# Patient Record
Sex: Female | Born: 1960
Health system: Southern US, Community
[De-identification: ages and names within clinical notes are randomized; demographics above are authoritative.]

## PROBLEM LIST (undated history)

## (undated) DIAGNOSIS — C3490 Malignant neoplasm of unspecified part of unspecified bronchus or lung: Secondary | ICD-10-CM

## (undated) DIAGNOSIS — Z5111 Encounter for antineoplastic chemotherapy: Secondary | ICD-10-CM

## (undated) DIAGNOSIS — L270 Generalized skin eruption due to drugs and medicaments taken internally: Secondary | ICD-10-CM

## (undated) DIAGNOSIS — Z923 Personal history of irradiation: Secondary | ICD-10-CM

## (undated) HISTORY — DX: Malignant neoplasm of unspecified part of unspecified bronchus or lung: C34.90

## (undated) HISTORY — DX: Encounter for antineoplastic chemotherapy: Z51.11

## (undated) HISTORY — DX: Generalized skin eruption due to drugs and medicaments taken internally: L27.0

---

## 1998-07-06 ENCOUNTER — Emergency Department (HOSPITAL_COMMUNITY): Admission: EM | Admit: 1998-07-06 | Discharge: 1998-07-06 | Payer: Self-pay | Admitting: Emergency Medicine

## 2008-10-19 ENCOUNTER — Ambulatory Visit (HOSPITAL_COMMUNITY): Admission: RE | Admit: 2008-10-19 | Discharge: 2008-10-19 | Payer: Self-pay | Admitting: Emergency Medicine

## 2008-10-29 ENCOUNTER — Ambulatory Visit: Payer: Self-pay | Admitting: Pulmonary Disease

## 2008-11-18 ENCOUNTER — Ambulatory Visit: Payer: Self-pay | Admitting: Pulmonary Disease

## 2008-11-19 ENCOUNTER — Telehealth: Payer: Self-pay | Admitting: Pulmonary Disease

## 2008-12-10 ENCOUNTER — Ambulatory Visit: Payer: Self-pay | Admitting: Pulmonary Disease

## 2008-12-12 ENCOUNTER — Ambulatory Visit: Payer: Self-pay | Admitting: Pulmonary Disease

## 2008-12-12 ENCOUNTER — Ambulatory Visit: Admission: RE | Admit: 2008-12-12 | Discharge: 2008-12-12 | Payer: Self-pay | Admitting: Pulmonary Disease

## 2008-12-12 ENCOUNTER — Encounter: Payer: Self-pay | Admitting: Pulmonary Disease

## 2009-01-01 ENCOUNTER — Ambulatory Visit: Payer: Self-pay | Admitting: Pulmonary Disease

## 2009-02-16 ENCOUNTER — Ambulatory Visit: Payer: Self-pay | Admitting: Pulmonary Disease

## 2009-02-20 ENCOUNTER — Ambulatory Visit (HOSPITAL_COMMUNITY): Admission: RE | Admit: 2009-02-20 | Discharge: 2009-02-20 | Payer: Self-pay | Admitting: Pulmonary Disease

## 2009-03-06 ENCOUNTER — Ambulatory Visit (HOSPITAL_COMMUNITY): Admission: RE | Admit: 2009-03-06 | Discharge: 2009-03-06 | Payer: Self-pay | Admitting: Pulmonary Disease

## 2009-03-09 ENCOUNTER — Ambulatory Visit: Payer: Self-pay | Admitting: Pulmonary Disease

## 2009-03-11 ENCOUNTER — Encounter: Payer: Self-pay | Admitting: Pulmonary Disease

## 2009-03-13 ENCOUNTER — Ambulatory Visit: Admission: RE | Admit: 2009-03-13 | Discharge: 2009-03-13 | Payer: Self-pay | Admitting: Psychiatry

## 2009-03-13 ENCOUNTER — Encounter: Payer: Self-pay | Admitting: Pulmonary Disease

## 2009-03-17 ENCOUNTER — Encounter: Payer: Self-pay | Admitting: Pulmonary Disease

## 2009-03-17 ENCOUNTER — Ambulatory Visit (HOSPITAL_COMMUNITY): Admission: RE | Admit: 2009-03-17 | Discharge: 2009-03-17 | Payer: Self-pay | Admitting: Gynecologic Oncology

## 2009-03-17 ENCOUNTER — Ambulatory Visit: Admission: RE | Admit: 2009-03-17 | Discharge: 2009-03-17 | Payer: Self-pay | Admitting: Gynecologic Oncology

## 2009-03-20 ENCOUNTER — Ambulatory Visit: Payer: Self-pay | Admitting: Surgery

## 2009-04-08 ENCOUNTER — Encounter: Payer: Self-pay | Admitting: Surgery

## 2009-04-08 ENCOUNTER — Encounter: Payer: Self-pay | Admitting: Pulmonary Disease

## 2009-04-08 ENCOUNTER — Ambulatory Visit: Payer: Self-pay | Admitting: Surgery

## 2009-04-08 ENCOUNTER — Inpatient Hospital Stay (HOSPITAL_COMMUNITY): Admission: RE | Admit: 2009-04-08 | Discharge: 2009-04-13 | Payer: Self-pay | Admitting: Surgery

## 2009-04-16 ENCOUNTER — Ambulatory Visit: Payer: Self-pay | Admitting: Surgery

## 2009-04-18 HISTORY — PX: LUNG LOBECTOMY: SHX167

## 2009-04-28 ENCOUNTER — Ambulatory Visit: Payer: Self-pay | Admitting: Internal Medicine

## 2009-04-28 ENCOUNTER — Ambulatory Visit: Payer: Self-pay | Admitting: Surgery

## 2009-04-28 ENCOUNTER — Encounter: Admission: RE | Admit: 2009-04-28 | Discharge: 2009-04-28 | Payer: Self-pay | Admitting: Surgery

## 2009-05-04 ENCOUNTER — Encounter: Payer: Self-pay | Admitting: Pulmonary Disease

## 2009-05-04 LAB — CBC WITH DIFFERENTIAL/PLATELET
BASO%: 0.2 % (ref 0.0–2.0)
Basophils Absolute: 0 10*3/uL (ref 0.0–0.1)
EOS%: 4.4 % (ref 0.0–7.0)
Eosinophils Absolute: 0.4 10*3/uL (ref 0.0–0.5)
HCT: 36.7 % (ref 34.8–46.6)
HGB: 11.7 g/dL (ref 11.6–15.9)
LYMPH%: 21.3 % (ref 14.0–49.7)
MCH: 24.2 pg — ABNORMAL LOW (ref 25.1–34.0)
MCHC: 31.9 g/dL (ref 31.5–36.0)
MCV: 76 fL — ABNORMAL LOW (ref 79.5–101.0)
MONO#: 0.6 10*3/uL (ref 0.1–0.9)
MONO%: 6.3 % (ref 0.0–14.0)
NEUT#: 6.6 10*3/uL — ABNORMAL HIGH (ref 1.5–6.5)
NEUT%: 67.8 % (ref 38.4–76.8)
Platelets: 379 10*3/uL (ref 145–400)
RBC: 4.83 10*6/uL (ref 3.70–5.45)
RDW: 15 % — ABNORMAL HIGH (ref 11.2–14.5)
WBC: 9.7 10*3/uL (ref 3.9–10.3)
lymph#: 2.1 10*3/uL (ref 0.9–3.3)

## 2009-05-04 LAB — COMPREHENSIVE METABOLIC PANEL
ALT: 11 U/L (ref 0–35)
AST: 16 U/L (ref 0–37)
Albumin: 4.2 g/dL (ref 3.5–5.2)
Alkaline Phosphatase: 54 U/L (ref 39–117)
BUN: 9 mg/dL (ref 6–23)
CO2: 24 mEq/L (ref 19–32)
Calcium: 9.2 mg/dL (ref 8.4–10.5)
Chloride: 104 mEq/L (ref 96–112)
Creatinine, Ser: 0.47 mg/dL (ref 0.40–1.20)
Glucose, Bld: 82 mg/dL (ref 70–99)
Potassium: 4.6 mEq/L (ref 3.5–5.3)
Sodium: 138 mEq/L (ref 135–145)
Total Bilirubin: 0.2 mg/dL — ABNORMAL LOW (ref 0.3–1.2)
Total Protein: 7.2 g/dL (ref 6.0–8.3)

## 2009-05-05 ENCOUNTER — Ambulatory Visit: Payer: Self-pay | Admitting: Pulmonary Disease

## 2009-05-06 DIAGNOSIS — C3431 Malignant neoplasm of lower lobe, right bronchus or lung: Secondary | ICD-10-CM | POA: Insufficient documentation

## 2009-05-06 DIAGNOSIS — D259 Leiomyoma of uterus, unspecified: Secondary | ICD-10-CM | POA: Insufficient documentation

## 2009-06-16 ENCOUNTER — Encounter: Admission: RE | Admit: 2009-06-16 | Discharge: 2009-06-16 | Payer: Self-pay | Admitting: Family Medicine

## 2009-08-04 ENCOUNTER — Encounter: Admission: RE | Admit: 2009-08-04 | Discharge: 2009-08-04 | Payer: Self-pay | Admitting: Surgery

## 2009-08-04 ENCOUNTER — Ambulatory Visit: Payer: Self-pay | Admitting: Surgery

## 2009-11-03 ENCOUNTER — Ambulatory Visit: Payer: Self-pay | Admitting: Surgery

## 2009-11-03 ENCOUNTER — Encounter: Admission: RE | Admit: 2009-11-03 | Discharge: 2009-11-03 | Payer: Self-pay | Admitting: Surgery

## 2009-11-11 ENCOUNTER — Ambulatory Visit: Payer: Self-pay | Admitting: Internal Medicine

## 2009-11-13 ENCOUNTER — Ambulatory Visit (HOSPITAL_COMMUNITY): Admission: RE | Admit: 2009-11-13 | Discharge: 2009-11-13 | Payer: Self-pay | Admitting: Internal Medicine

## 2009-11-13 LAB — COMPREHENSIVE METABOLIC PANEL
ALT: 17 U/L (ref 0–35)
AST: 20 U/L (ref 0–37)
Albumin: 3.9 g/dL (ref 3.5–5.2)
Alkaline Phosphatase: 62 U/L (ref 39–117)
BUN: 11 mg/dL (ref 6–23)
CO2: 27 mEq/L (ref 19–32)
Calcium: 8.6 mg/dL (ref 8.4–10.5)
Chloride: 105 mEq/L (ref 96–112)
Creatinine, Ser: 0.61 mg/dL (ref 0.40–1.20)
Glucose, Bld: 86 mg/dL (ref 70–99)
Potassium: 4.3 mEq/L (ref 3.5–5.3)
Sodium: 137 mEq/L (ref 135–145)
Total Bilirubin: 0.4 mg/dL (ref 0.3–1.2)
Total Protein: 7.7 g/dL (ref 6.0–8.3)

## 2009-11-13 LAB — CBC WITH DIFFERENTIAL/PLATELET
BASO%: 0.5 % (ref 0.0–2.0)
Basophils Absolute: 0.1 10*3/uL (ref 0.0–0.1)
EOS%: 0.7 % (ref 0.0–7.0)
Eosinophils Absolute: 0.1 10*3/uL (ref 0.0–0.5)
HCT: 35.3 % (ref 34.8–46.6)
HGB: 11.4 g/dL — ABNORMAL LOW (ref 11.6–15.9)
LYMPH%: 21.3 % (ref 14.0–49.7)
MCH: 23.8 pg — ABNORMAL LOW (ref 25.1–34.0)
MCHC: 32.3 g/dL (ref 31.5–36.0)
MCV: 73.6 fL — ABNORMAL LOW (ref 79.5–101.0)
MONO#: 0.8 10*3/uL (ref 0.1–0.9)
MONO%: 7.6 % (ref 0.0–14.0)
NEUT#: 7.6 10*3/uL — ABNORMAL HIGH (ref 1.5–6.5)
NEUT%: 69.9 % (ref 38.4–76.8)
Platelets: 317 10*3/uL (ref 145–400)
RBC: 4.79 10*6/uL (ref 3.70–5.45)
RDW: 17.6 % — ABNORMAL HIGH (ref 11.2–14.5)
WBC: 10.8 10*3/uL — ABNORMAL HIGH (ref 3.9–10.3)
lymph#: 2.3 10*3/uL (ref 0.9–3.3)

## 2009-11-17 ENCOUNTER — Encounter: Payer: Self-pay | Admitting: Pulmonary Disease

## 2010-02-02 ENCOUNTER — Encounter: Admission: RE | Admit: 2010-02-02 | Discharge: 2010-02-02 | Payer: Self-pay | Admitting: Surgery

## 2010-02-02 ENCOUNTER — Ambulatory Visit: Payer: Self-pay | Admitting: Surgery

## 2010-05-07 ENCOUNTER — Ambulatory Visit: Payer: Self-pay | Admitting: Internal Medicine

## 2010-05-11 ENCOUNTER — Ambulatory Visit (HOSPITAL_COMMUNITY): Admission: RE | Admit: 2010-05-11 | Discharge: 2010-05-11 | Payer: Self-pay | Admitting: Internal Medicine

## 2010-05-11 LAB — CBC WITH DIFFERENTIAL/PLATELET
BASO%: 0.6 % (ref 0.0–2.0)
Basophils Absolute: 0.1 10*3/uL (ref 0.0–0.1)
EOS%: 0.7 % (ref 0.0–7.0)
Eosinophils Absolute: 0.1 10*3/uL (ref 0.0–0.5)
HCT: 34.4 % — ABNORMAL LOW (ref 34.8–46.6)
HGB: 11.1 g/dL — ABNORMAL LOW (ref 11.6–15.9)
LYMPH%: 31.2 % (ref 14.0–49.7)
MCH: 23 pg — ABNORMAL LOW (ref 25.1–34.0)
MCHC: 32.2 g/dL (ref 31.5–36.0)
MCV: 71.2 fL — ABNORMAL LOW (ref 79.5–101.0)
MONO#: 0.6 10*3/uL (ref 0.1–0.9)
MONO%: 7.2 % (ref 0.0–14.0)
NEUT#: 5.4 10*3/uL (ref 1.5–6.5)
NEUT%: 60.3 % (ref 38.4–76.8)
Platelets: 444 10*3/uL — ABNORMAL HIGH (ref 145–400)
RBC: 4.83 10*6/uL (ref 3.70–5.45)
RDW: 17.1 % — ABNORMAL HIGH (ref 11.2–14.5)
WBC: 9 10*3/uL (ref 3.9–10.3)
lymph#: 2.8 10*3/uL (ref 0.9–3.3)

## 2010-05-11 LAB — COMPREHENSIVE METABOLIC PANEL
ALT: 14 U/L (ref 0–35)
AST: 18 U/L (ref 0–37)
Albumin: 4 g/dL (ref 3.5–5.2)
Alkaline Phosphatase: 57 U/L (ref 39–117)
BUN: 6 mg/dL (ref 6–23)
CO2: 29 mEq/L (ref 19–32)
Calcium: 8.9 mg/dL (ref 8.4–10.5)
Chloride: 104 mEq/L (ref 96–112)
Creatinine, Ser: 0.49 mg/dL (ref 0.40–1.20)
Glucose, Bld: 80 mg/dL (ref 70–99)
Potassium: 4.1 mEq/L (ref 3.5–5.3)
Sodium: 139 mEq/L (ref 135–145)
Total Bilirubin: 0.5 mg/dL (ref 0.3–1.2)
Total Protein: 7.5 g/dL (ref 6.0–8.3)

## 2010-05-17 IMAGING — CT CT CHEST W/O CM
2 of 4 series · 15 of 36 positions shown, 18 images · non-contrast
Comparison: 10/19/2008 chest CT and [DATE] Move to 5252 chest x-ray.

CLINICAL DATA: Follow-up right lower lobe lung process.

CT CHEST WITHOUT CONTRAST
TECHNIQUE: Multidetector CT imaging of the chest was performed
following the standard protocol without IV contrast.

[Series 2: chest w/o st · axial · non-contrast · 0.53mm/px · z∈[-196,-11]mm · 12 of 45 slices shown, 15 images]
[im 4/45  mediastinal]
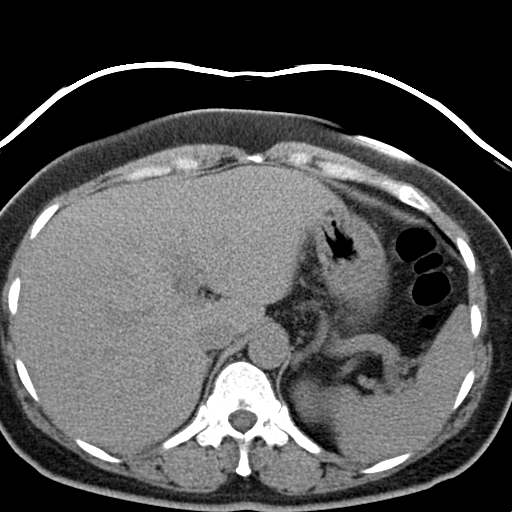
[im 4/45  lung]
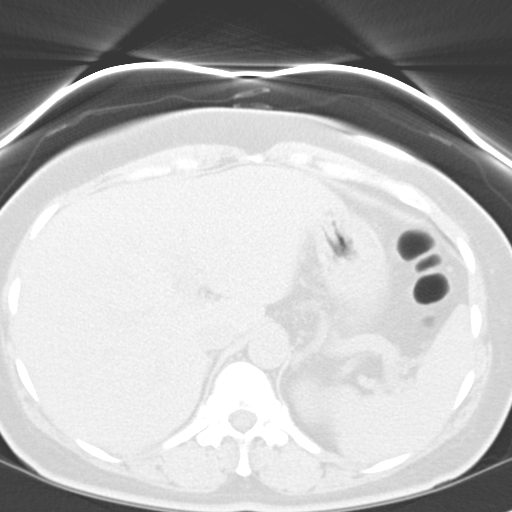
[im 7/45  lung]
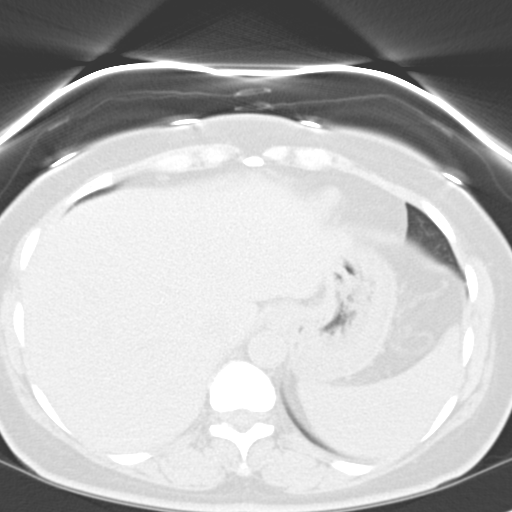
[im 11/45  lung]
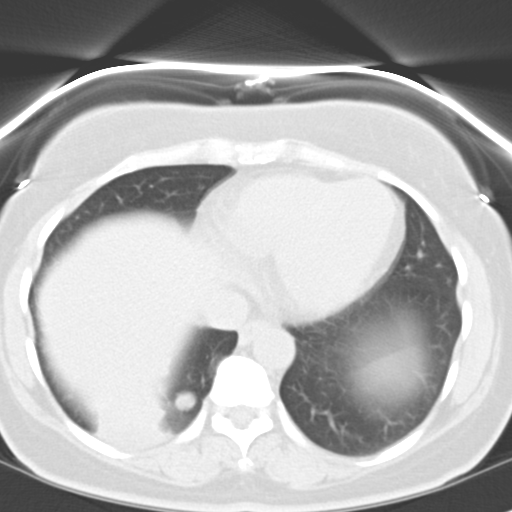
[im 14/45  lung]
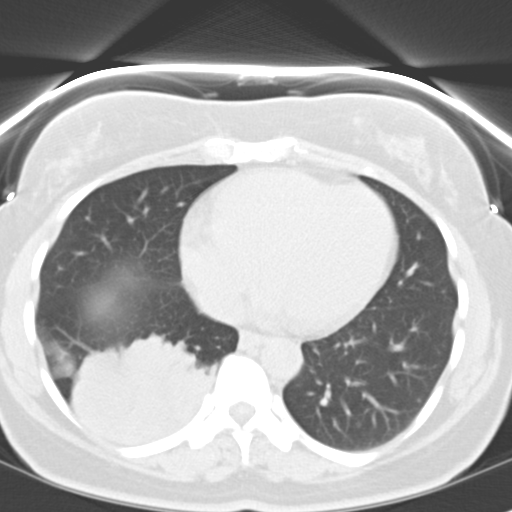
[im 17/45  mediastinal]
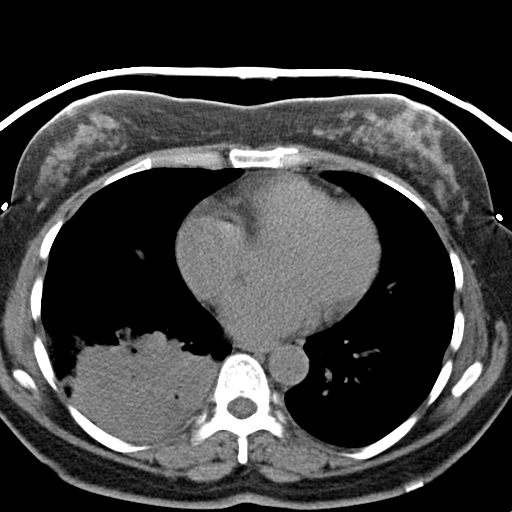
[im 17/45  lung]
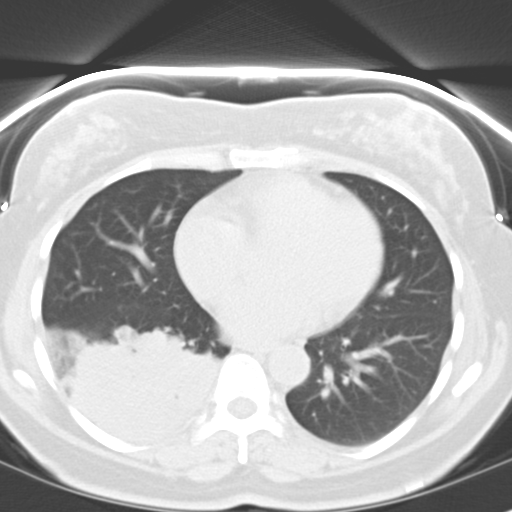
[im 21/45  lung]
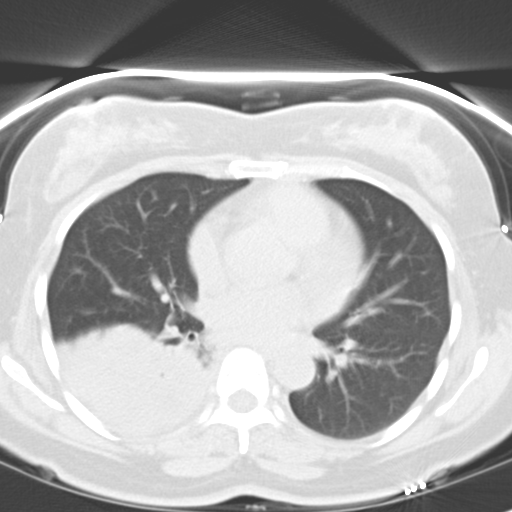
[im 24/45  lung]
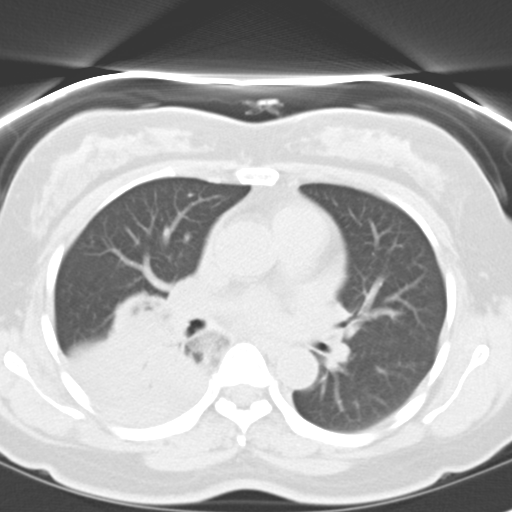
[im 28/45  lung]
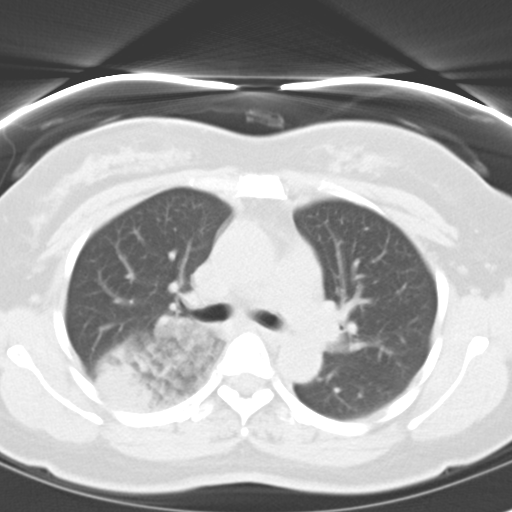
[im 31/45  mediastinal]
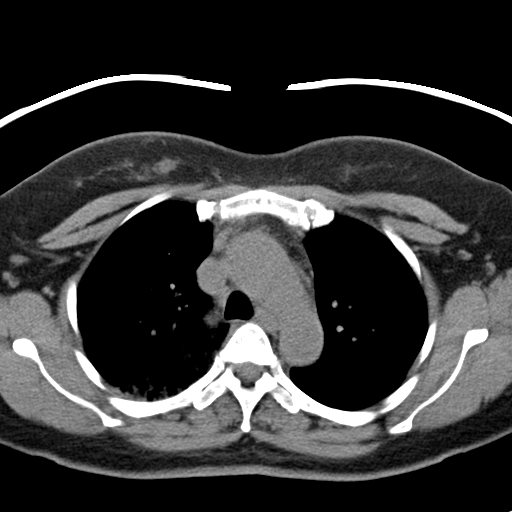
[im 31/45  lung]
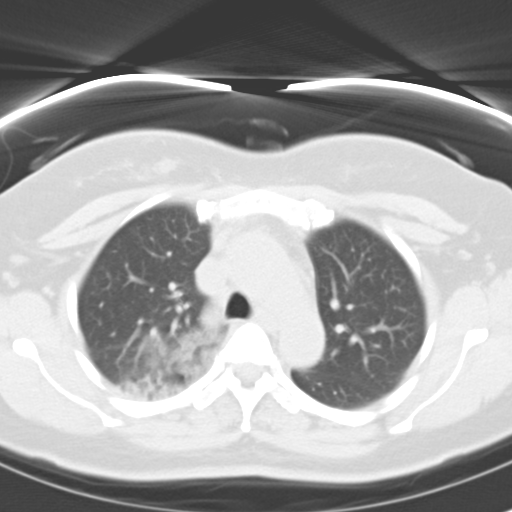
[im 34/45  lung]
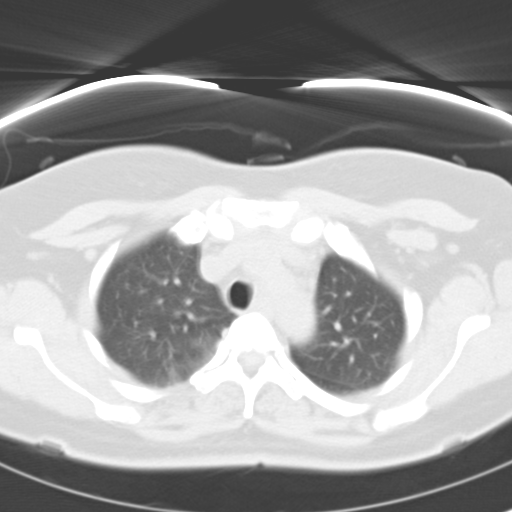
[im 38/45  lung]
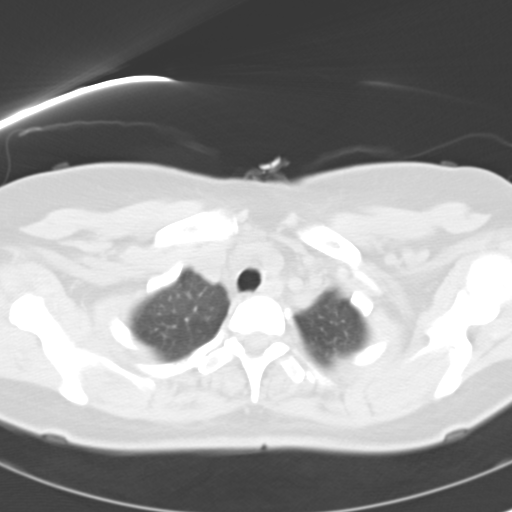
[im 41/45  lung]
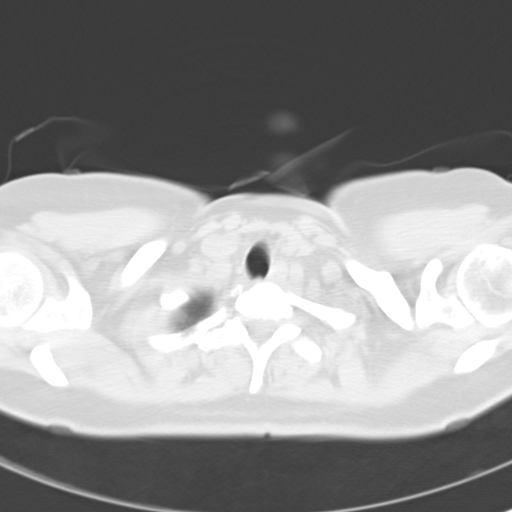

[Series 602: <mpr thick range> · coronal · 0.53mm/px · 3 of 63 slices shown]
[im 13/63  lung]
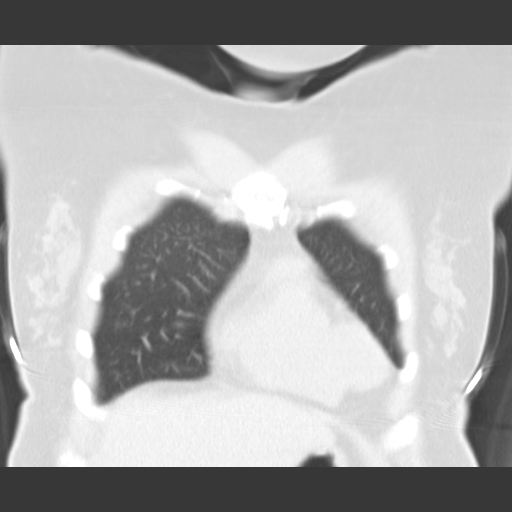
[im 25/63  lung]
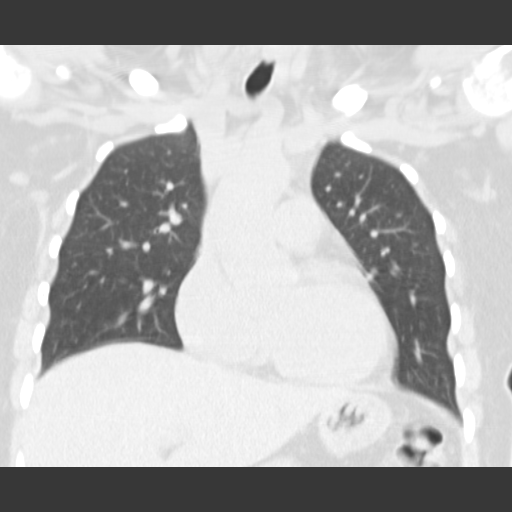
[im 38/63  lung]
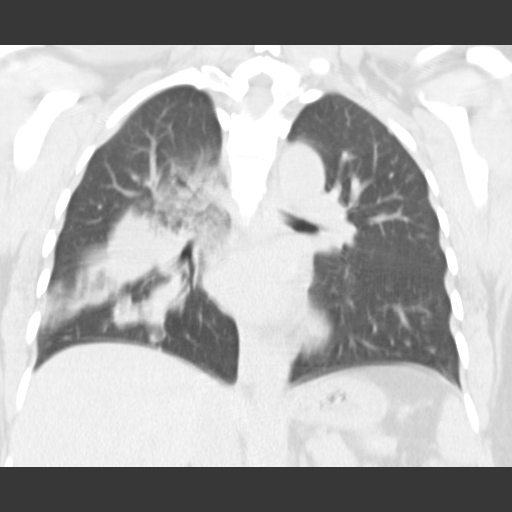

[15 of 36 positions shown; findings below may reference images not displayed]

FINDINGS: There is a persistent airspace opacity occupying the
right lower lobe.  This is not significantly changed since the
prior chest CT examination and although I do not see an obvious
endobronchial lesion the survey has a appearance of an obstructive
process.  In the right lower lobe there are two small nodular
densities adjacent to this airspace opacity.  There is a 5 mm
nodule on image number 34 and a 11 mm nodule on image number 35.
The larger nodule is also seen on the prior study where it measured
8 mm.

This patient was scheduled for a PET scan but the test has been
rescheduled because of technical difficulty.  This may help to
further evaluate the right lower lobe process.  Bronchoscopic
evaluation may also be useful.

No mediastinal or hilar adenopathy.  The heart is normal in size
and the aorta is normal in caliber.  No supraclavicular or axillary
adenopathy.

No significant bony findings.  The upper abdomen is grossly normal.
IMPRESSION: 1.  Persistent right lower lobe airspace opacity.  No obvious
obstructing mass or endobronchial lesion.
2.  Two right lower lobe pulmonary nodules adjacent to the lung
opacity.
3.  PET CT has been rescheduled due to technical difficulties.

## 2010-05-18 ENCOUNTER — Encounter: Payer: Self-pay | Admitting: Pulmonary Disease

## 2010-05-20 ENCOUNTER — Ambulatory Visit (HOSPITAL_COMMUNITY): Admission: RE | Admit: 2010-05-20 | Discharge: 2010-05-20 | Payer: Self-pay | Admitting: Internal Medicine

## 2010-05-26 ENCOUNTER — Encounter: Payer: Self-pay | Admitting: Pulmonary Disease

## 2010-07-19 ENCOUNTER — Encounter: Admission: RE | Admit: 2010-07-19 | Discharge: 2010-07-19 | Payer: Self-pay | Admitting: Family Medicine

## 2010-11-12 ENCOUNTER — Ambulatory Visit: Payer: Self-pay | Admitting: Internal Medicine

## 2010-11-15 ENCOUNTER — Ambulatory Visit (HOSPITAL_COMMUNITY)
Admission: RE | Admit: 2010-11-15 | Discharge: 2010-11-15 | Payer: Self-pay | Source: Home / Self Care | Attending: Internal Medicine | Admitting: Internal Medicine

## 2010-11-15 LAB — CMP (CANCER CENTER ONLY)
ALT(SGPT): 19 U/L (ref 10–47)
AST: 24 U/L (ref 11–38)
Albumin: 3.3 g/dL (ref 3.3–5.5)
Alkaline Phosphatase: 78 U/L (ref 26–84)
BUN, Bld: 12 mg/dL (ref 7–22)
CO2: 27 mEq/L (ref 18–33)
Calcium: 8.6 mg/dL (ref 8.0–10.3)
Chloride: 100 mEq/L (ref 98–108)
Creat: 0.5 mg/dl — ABNORMAL LOW (ref 0.6–1.2)
Glucose, Bld: 90 mg/dL (ref 73–118)
Potassium: 4 mEq/L (ref 3.3–4.7)
Sodium: 136 mEq/L (ref 128–145)
Total Bilirubin: 0.5 mg/dl (ref 0.20–1.60)
Total Protein: 7.1 g/dL (ref 6.4–8.1)

## 2010-11-15 LAB — CBC WITH DIFFERENTIAL/PLATELET
BASO%: 0.6 % (ref 0.0–2.0)
Basophils Absolute: 0.1 10*3/uL (ref 0.0–0.1)
EOS%: 2.1 % (ref 0.0–7.0)
Eosinophils Absolute: 0.2 10*3/uL (ref 0.0–0.5)
HCT: 33.9 % — ABNORMAL LOW (ref 34.8–46.6)
HGB: 11.3 g/dL — ABNORMAL LOW (ref 11.6–15.9)
LYMPH%: 25.5 % (ref 14.0–49.7)
MCH: 23.5 pg — ABNORMAL LOW (ref 25.1–34.0)
MCHC: 33.3 g/dL (ref 31.5–36.0)
MCV: 70.5 fL — ABNORMAL LOW (ref 79.5–101.0)
MONO#: 0.7 10*3/uL (ref 0.1–0.9)
MONO%: 6.5 % (ref 0.0–14.0)
NEUT#: 6.9 10*3/uL — ABNORMAL HIGH (ref 1.5–6.5)
NEUT%: 65.3 % (ref 38.4–76.8)
Platelets: 448 10*3/uL — ABNORMAL HIGH (ref 145–400)
RBC: 4.81 10*6/uL (ref 3.70–5.45)
RDW: 18.9 % — ABNORMAL HIGH (ref 11.2–14.5)
WBC: 10.5 10*3/uL — ABNORMAL HIGH (ref 3.9–10.3)
lymph#: 2.7 10*3/uL (ref 0.9–3.3)

## 2010-11-23 ENCOUNTER — Encounter: Payer: Self-pay | Admitting: Pulmonary Disease

## 2010-12-17 ENCOUNTER — Other Ambulatory Visit: Payer: Self-pay | Admitting: Internal Medicine

## 2010-12-17 DIAGNOSIS — C3490 Malignant neoplasm of unspecified part of unspecified bronchus or lung: Secondary | ICD-10-CM

## 2010-12-19 ENCOUNTER — Encounter: Payer: Self-pay | Admitting: Pulmonary Disease

## 2010-12-28 NOTE — Assessment & Plan Note (Signed)
Summary: rov/apc   Copy to:  alva Primary Provider/Referring Provider:  urgent care  CC:  Pt here for follow-up for pneumonia. pt states she still has a cough but it is much better.Marland Kitchen  History of Present Illness: 50 y.o [place], non smoker,with persistent RLL infiltrate since11/09 Bronchoscopy >> no endobronchial lesions, neg afb/ fungal  Was seen in urgent care 11/1 for flu like symptoms , CXR RLL infx,  treated with tamiflu & avelox, stayed off work x 2 weeks CT chest 11/22 >>confluent ASD involving RLL c/w pneumonia, no endobronchial lesions, no effusion. >> Given augmentin x 10 ds PPD status unknown, No childhood h/o TB 1/10 c/o dyspnea, clear phlegm now. 2/10 Denies fevers, wt loss, appetite OK, no cough, dyspnea, chst pain, hemoptysis Reviewed cx data & bx report >> all neg  CXR 01/01/09 unchanged RLL ASD  January 01, 2009 - f/u ov. Persistent RLL infiltrate.  REC - f/u cxr in 6 weeks.  If persists f/u CT scan.  February 16, 2009 - here for f/u of persistent RLL infiltrate.  Seen again on today's Xray.  May have some slight improvement but persists.  Denies chest pain, orthopnea, hemoptysis, fever, n/v/d, edema,   Does have some DOE and occasional non productive cough. Feels okay near baseline.   Current Medications (verified): 1)  Tylenol Arthritis Pain 650 Mg Cr-Tabs (Acetaminophen)  Allergies (verified): No Known Drug Allergies  Past History:  Past Medical History:    1. pneumonia-persistent RLL infiltrate since11/09    Bronchoscopy >> no endobronchial lesions, neg afb/ fungal--CT chest 11/22 >>confluent ASD involving RLL c/w pneumonia, no endobronchial lesions, no effusion. >> Given augmentin x 10 ds    PPD status unknown, No childhood h/o TB  Review of Systems      See HPI  Vital Signs:  Patient profile:   50 year old female Height:      61 inches Weight:      125.25 pounds O2 Sat:      98 % Temp:     98.0 degrees F oral Pulse rate:   79 / minute BP sitting:    118 / 70  Vitals Entered By: Carron Curie (February 16, 2009 4:48 PM)  O2 Sat at Rest %:  98 O2 Flow:  room air  Physical Exam  Additional Exam:  Gen: wdwn female, NAD, non english speaking with daughter translating neuro: alert, cooperative, moves all ext lungs: clear, slightly diminished R base, no wheeze cards: S1S2 RRR no murmurs abd: soft non tender non distended ext: warm and dry, no edema   Pulmonary Function Test Height (in.): 61 Gender: Female  Impression & Recommendations:  Problem # 1:  BACTERIAL PNEUMONIA (ICD-482.9) symptomatically improved although still some DOE and nonproductive cough.w/ persistent RLL infiltrate  REC :  follow up CT scan chest Mucinex DM as needed for cough follow up with Dr. Vassie Loll as soon as CT scan completed to discuss results.  Please contact office for sooner follow up if symptoms do not improve or worsen   Complete Medication List: 1)  Tylenol Arthritis Pain 650 Mg Cr-tabs (Acetaminophen)  Other Orders: T-2 View CXR, Same Day (71020.5TC) Est. Patient Level III (16109) Radiology Referral (Radiology)  Patient Instructions: 1)  CT scan chest 2)  Mucinex DM as needed for cough  3)  follow up with Dr. Vassie Loll as soon as CT scan completed 4)  Please contact office for sooner follow up if symptoms do not improve or worsen   Appended Document: Orders Update  reviewed CXR - persistent infx since 11/09 - will obtain PET-CT, cancel CT chest order   Clinical Lists Changes  Orders: Added new Referral order of Radiology Referral (Radiology) - Signed

## 2010-12-28 NOTE — Assessment & Plan Note (Signed)
Summary: rov/apc   Visit Type:  Follow-up Copy to:  alva Primary Nazariah Cadet/Referring Kemo Spruce:  urgent care  CC:  Follow up.  History of Present Illness: 50 y.o [place], non smoker, for FU post surgery. Pre-op FEV1 1.7 She presented with persistent RLL infiltrate since 11/09. Bronchoscopy >> no endobronchial lesions, neg afb/ fungal  4/10 >> Mucoid phlegm, fevers on one occasion, lost 4 lbs over 5 months, G7 P7, youngest child 17 yrs Was told at urgent care in [facility] hospital that she has fibroids. PET 4/10 >>Persistent mass-like consolidation in the right lower lobe withmalignant range activity. Large pelvic mass with a focal area of malignant range activity.   Gyn Onc Evaluation Dr Duard Brady >> likley fibroids  6/10 >>underwent flexible fiberoptic bronchoscopy with right video-assisted thoracoscopic surgery, right thoracotomy with right lower lobectomy. Pathology>> adenocarcinoma with abundant mucin production T2a N0 Mx. BAC features+, foal involvement of visceral pleura.   Denies chest pain, orthopnea, hemoptysis, fever, n/v/d, edema,   Does have some DOE and occasional non productive cough. Feels okay near baseline.   Current Medications (verified): 1)  Tylenol Arthritis Pain 650 Mg Cr-Tabs (Acetaminophen) .... As Needed  Allergies (verified): No Known Drug Allergies  Past History:  Past medical, surgical, family and social histories (including risk factors) reviewed, and no changes noted (except as noted below).  Past Medical History: PPD status unknown, No childhood h/o TB  Past Pulmonary History:  Pulmonary History: Was seen in urgent care 11/1 for flu like symptoms , CXR RLL infx,  treated with tamiflu & avelox, stayed off work x 2 weeks CT chest 11/22 >>confluent ASD involving RLL c/w pneumonia, no endobronchial lesions, no effusion. >> Given augmentin x 10 ds PPD status unknown, No childhood h/o TB 1/10 c/o dyspnea, clear phlegm now. 2/10 bscopy  no endobronchial  lesions, neg afb/ fungal  Family History: Reviewed history from 10/29/2008 and no changes required. heart disease- mother, brother  Social History: Reviewed history from 10/29/2008 and no changes required. lives with husband and children - immigrated in 1990 occupation--sews 3 dogs and 4 cats  Vital Signs:  Patient profile:   50 year old female Height:      61 inches Weight:      121.50 pounds O2 Sat:      99 % on Room air Temp:     98.2 degrees F oral Pulse rate:   93 / minute BP sitting:   100 / 70  (right arm) Cuff size:   regular  Vitals Entered By: Zackery Barefoot CMA (May 05, 2009 11:47 AM)  O2 Flow:  Room air CC: Follow up Comments Medications reviewed with patient Zackery Barefoot CMA  May 05, 2009 11:47 AM    Physical Exam  Additional Exam:  Gen. Pleasant, well-nourished, in no distress, normal affect ENT - no lesions, no post nasal drip Neck: No JVD, no thyromegaly, no carotid bruits Lungs: no use of accessory muscles, no dullness to percussion, clear without rales or rhonchi , healing scar Cardiovascular: Rhythm regular, heart sounds  normal, no murmurs or gallops, no peripheral edema Abdomen: soft and non-tender, no hepatosplenomegaly, BS normal. Musculoskeletal: No deformities, no cyanosis or clubbing Neuro:  alert, non focal     Impression & Recommendations:  Problem # 1:  ADENOCARCINOMA, LUNG, RIGHT (ICD-162.9) Assessment New To review Oncology recommendations- no chemoRX for now. No evidence of mediastinal involvement. Surveillance CT planned for 12/10 Detailed discussion regarding course, chances of recurrence given large size of tumor & need for  surveillance. She  was tearful & said she was scared but hopeful & I gave a cautiously optimistic prognosis.  Problem # 2:  LEIOMYOMA, UTERUS (ICD-218.9)  6 month FU with Gyn  Orders: Est. Patient Level III (65784)  Patient Instructions: 1)  Please schedule a follow-up appointment in 4  months. 2)  Keep up with CT scan appt with Dr Shirline Frees 3)  Call me if any new symptoms  Appended Document: rov/apc FU OV in next few weeks  Appended Document: rov/apc left message with daughter for pt to call back and schedule appointment. jwr

## 2010-12-28 NOTE — Consult Note (Signed)
Summary: [facility]- Dr.Paola A.Duard Brady M.D.  [facility]- Dr.Paola A.Duard Brady M.D.   Imported By: Sherian Rein 03/26/2009 11:34:03  _____________________________________________________________________  External Attachment:    Type:   Image     Comment:   External Document

## 2010-12-28 NOTE — Miscellaneous (Signed)
Summary: Orders Update  Clinical Lists Changes  Orders: Added new Referral order of Pulmonary Referral (Pulmonary) - Signed 

## 2010-12-28 NOTE — Progress Notes (Signed)
Summary: rx req/ sob  Phone Note Call from Patient   Caller: Other Relative Call For: alva Summary of Call: caller- family member- requests pt have rx called in for her sob. cell # [phone]. pt has an ov sched for 1/ 13. Initial call taken by: Tivis Ringer,  November 19, 2008 10:58 AM  Follow-up for Phone Call        I spoke with Lang, pt's relative, and she says the pt still c/o sob. The sob is no worse, but wants something for this. She says her CXR was normal. Please advise. Michel Bickers [facility]  November 19, 2008 2:37 PM  Additional Follow-up for Phone Call Additional follow up Details #1::        I have spoken to the pt's son & advised him to wait until next CR on fu visit - The pneumonia is likely causing her symptoms - no further Rx advised at this time Additional Follow-up by: Comer Locket. Vassie Loll MD,  November 19, 2008 2:40 PM

## 2010-12-28 NOTE — Letter (Signed)
[facility]   Imported By: Sherian Rein 06/08/2010 09:40:43  _____________________________________________________________________  External Attachment:    Type:   Image     Comment:   External Document

## 2010-12-28 NOTE — Miscellaneous (Signed)
Summary: Orders Update  Clinical Lists Changes  Orders: Added new Test order of T-2 View CXR (71020TC) - Signed 

## 2010-12-28 NOTE — Letter (Signed)
Summary: NP/[facility]  [facility]   Imported By: Sherian Rein 09/24/2009 14:51:05  _____________________________________________________________________  External Attachment:    Type:   Image     Comment:   External Document

## 2010-12-28 NOTE — Assessment & Plan Note (Signed)
Summary: 2 weeks/mbw   Visit Type:  Follow-up Referred by:  alva PCP:  urgent care   History of Present Illness: 50 y.o [place], non smoker,with persistent RLL infiltrate since11/09 Bronchoscopy >> no endobronchial lesions, neg afb/ fungal  Was seen in urgent care 11/1 for flu like symptoms , CXR RLL infx,  treated with tamiflu & avelox, stayed off work x 2 weeks CT chest 11/22 >>confluent ASD involving RLL c/w pneumonia, no endobronchial lesions, no effusion. >> Given augmentin x 10 ds PPD status unknown, No childhood h/o TB 1/10 c/o dyspnea, clear phlegm now. 2/10 Denies fevers, wt loss, appetite OK, no cough, dyspnea, chst pain, hemoptysis Reviewed cx data & bx report >> all neg  CXR 01/01/09 unchanged RLL ASD   Past Medical History:    Reviewed history from 10/29/2008 and no changes required:       pneumonia  Social History:    Reviewed history from 10/29/2008 and no changes required:       lives with husband and children - immigrated in 1990       occupation--sews       3 dogs and 4 cats   Review of Systems  The patient denies anorexia, fever, weight loss, weight gain, vision loss, decreased hearing, hoarseness, chest pain, syncope, dyspnea on exertion, peripheral edema, prolonged cough, headaches, hemoptysis, abdominal pain, melena, hematochezia, severe indigestion/heartburn, hematuria, incontinence, genital sores, muscle weakness, suspicious skin lesions, transient blindness, difficulty walking, depression, unusual weight change, abnormal bleeding, enlarged lymph nodes, and angioedema.    Vital Signs:  Patient Profile:   50 Years Old Female Height:     61 inches Weight:      128 pounds O2 Sat:      98 % O2 treatment:    Room Air Temp:     98.3 degrees F oral Pulse rate:   78 / minute BP sitting:   100 / 68  (right arm) Cuff size:   regular  Vitals Entered By: Marinus Maw (January 01, 2009 3:47 PM)             Comments Medications reviewed with  patient Marinus Maw  January 01, 2009 3:47 PM     Physical Exam  General:     well developed, well nourished, in no acute distresswell developed, well nourished, in no acute distress Head:     normocephalic and atraumatic Eyes:     PERRLA/EOM intact; conjunctiva and sclera clear Ears:     TMs intact and clear with normal canals Nose:     no deformity, discharge, inflammation, or lesions Mouth:     no deformity or lesions Neck:     no masses, thyromegaly, or abnormal cervical nodes Chest Wall:     no deformities noted Lungs:     clear bilaterally to auscultation and percussion vocal resonance equal BL Heart:     regular rate and rhythm, S1, S2 without murmurs, rubs, gallops, or clicks Abdomen:     bowel sounds positive; abdomen soft and non-tender without masses, or organomegaly Msk:     no deformity or scoliosis noted with normal posture Pulses:     pulses normal Extremities:     no clubbing, cyanosis, edema, or deformity noted Neurologic:     CN II-XII grossly intact with normal reflexes, coordination, muscle strength and tone Skin:     intact without lesions or rashes Cervical Nodes:     no significant adenopathy Axillary Nodes:     no significant adenopathy Psych:  alert and cooperative; normal mood and affect; normal attention span and concentration   Imaging Exam  Procedure date:  01/01/2009  Findings:      IMPRESSION: Persistent consolidative density posteriorly in the right lower lobe.  This has a remarkably stable appearance over time, raising the question of possible mass lesion or atypical organism  Impression & Recommendations:  Problem # 1:  BACTERIAL PNEUMONIA (ICD-482.9) Persistent RLL infilatrate although symptomatically almost back to normal. Needs FU CXR in 4-6 weeks - if persistent, will pursue rpt CT & perhaps surgical bx.  Cx neg, afb neg, has received two rounds of antibiotics, since asymptomatic will observe for  now. Have discussed this approach & rationale with pt & son. Orders: T-2 View CXR, Same Day (71020.5TC)   Medications Added to Medication List This Visit: 1)  Tylenol Arthritis Pain 650 Mg Cr-tabs (Acetaminophen)  Patient Instructions: 1)  Please schedule a follow-up appointment in 6 weeks. 2)  A chest x-ray has been recommended.  Your imaging study may require preauthorization.  3)  Your pneumonia is resolving slowly.  Appended Document: Orders Update    Clinical Lists Changes  Orders: Added new Service order of Est. Patient Level III (04540) - Signed

## 2010-12-28 NOTE — Letter (Signed)
[facility]   Imported By: Sherian Rein 06/25/2010 11:05:18  _____________________________________________________________________  External Attachment:    Type:   Image     Comment:   External Document

## 2010-12-28 NOTE — Assessment & Plan Note (Signed)
Summary: f/u after ct ///kp   Copy to:  [name] Primary Provider/Referring Provider:  urgent care  CC:  Followup- Review PET scan and CT Chest.  Pt c/o prod cough with clear sputum, states that this is nothing new, and just not getting better.[name]  History of Present Illness: 50 y.o [name], non smoker,with persistent RLL infiltrate since11/09 Bronchoscopy >> no endobronchial lesions, neg afb/ fungal  4/10 >> accompanied by husband & daughter. Mucoid phlegm, fevers on one occasion, lost 4 lbs over 5 months, G7 P7, youngest child 17 yrs Was told at urgent care in [facility] hospital that she has fibroids.   Denies chest pain, orthopnea, hemoptysis, fever, n/v/d, edema,   Does have some DOE and occasional non productive cough. Feels okay near baseline.   Current Medications (verified): 1)  Tylenol Arthritis Pain 650 Mg Cr-Tabs (Acetaminophen) .... As Needed  Allergies (verified): No Known Drug Allergies  Past History:  Social History:    lives with husband and children - immigrated in 1990    occupation--sews    3 dogs and 4 cats (10/29/2008)  Risk Factors:    Smoking Status: never (10/29/2008)    Packs/Day: N/A    Cigars/wk: N/A    Pipe Use/wk: N/A    Cans of tobacco/wk: N/A    Passive Smoke Exposure: N/A  Past medical, surgical, family and social histories (including risk factors) reviewed for relevance to current acute and chronic problems.  Past Medical History:    Reviewed history from 02/16/2009 and no changes required:    1. pneumonia-persistent RLL infiltrate since11/09    Bronchoscopy >> no endobronchial lesions, neg afb/ fungal--CT chest 11/22 >>confluent ASD involving RLL c/w pneumonia, no endobronchial lesions, no effusion. >> Given augmentin x 10 ds    PPD status unknown, No childhood h/o TB  Past Pulmonary History:  Pulmonary History:    Was seen in urgent care 11/1 for flu like symptoms , CXR RLL infx,  treated with tamiflu & avelox, stayed off work x 2 weeks CT  chest 11/22 >>confluent ASD involving RLL c/w pneumonia, no endobronchial lesions, no effusion. >> Given augmentin x 10 ds    PPD status unknown, No childhood h/o TB    1/10 c/o dyspnea, clear phlegm now.    2/10 bscopy  no endobronchial lesions, neg afb/ fungal  Family History:    Reviewed history from 10/29/2008 and no changes required:       heart disease- mother, brother  Social History:    Reviewed history from 10/29/2008 and no changes required:       lives with husband and children - immigrated in 1990       occupation--sews       3 dogs and 4 cats  Vital Signs:  Patient profile:   50 year old female Weight:      126 pounds BMI:     23.89 Temp:     98.2 degrees F oral Pulse rate:   70 / minute BP sitting:   110 / 60  (left arm)  Vitals Entered By: Vernie Murders (March 09, 2009 4:05 PM)  O2 Sat on room air at rest %:  99  Physical Exam  Additional Exam:  Gen. Pleasant, well-nourished, in no distress, normal affect ENT - no lesions, no post nasal drip Neck: No JVD, no thyromegaly, no carotid bruits Lungs: no use of accessory muscles, no dullness to percussion, clear without rales or rhonchi  Cardiovascular: Rhythm regular, heart sounds  normal, no murmurs or gallops,  no peripheral edema Abdomen: soft and non-tender, no hepatosplenomegaly, BS normal. Musculoskeletal: No deformities, no cyanosis or clubbing Neuro:  alert, non focal     Imaging Exam  Procedure date:  03/06/2009  Findings:      IMPRESSION:   1.  Persistent mass-like consolidation in the right lower lobe with malignant range activity.  If re-biopsy is considered, recommend sampling of the area of greatest metabolic activity in the superior segment right lower lobe. 2.  Large pelvic mass with a focal area of malignant range activity.  Pelvic MR may be helpful in further evaluation, as clinically indicated. 3.  Mild patchy uptake is seen in the osseous structures, without focal CT  correlate.  Impression & Recommendations:  Problem # 1:  BACTERIAL PNEUMONIA (ICD-482.9) Detailed discussion with pt & family regarding persistent RLL infiltrate, possible implications. Have to entertain unusual causes such as inflammatory pneumonias or malignancy in this non-smoker. D/w radiology (IR) - pelvic mass could represent fibroid uterus- will need further imaging to clarify such as ? pelvic MRI WIll refer to Gyn Onc to order the required imaging study. Willa lso refer to thoacic surgery for VATS BX of RLL lesion, pending Gyn Onc input- Unclar at this time whether needle bx would be helpful. WIll discuss further in multi-disciplinary thoracic oncology conference. Spent x 20 mins discussing these options with family. Reviewed all earlier imaging studies & d/w IR.  Medications Added to Medication List This Visit: 1)  Tylenol Arthritis Pain 650 Mg Cr-tabs (Acetaminophen) .... As needed  Other Orders: Est. Patient Level IV (16109) Surgical Referral (Surgery) Gynecologic Referral (Gyn)  Patient Instructions: 1)  Please schedule a follow-up appointment in 3 weeks. 2)  refer to Gynecology for evaluation of 'fibroid'  3)  Referral to thoracic surgery for bipsy of lung  Appended Document: f/u after ct ///kp discussed at [facility] >> GYn referral, CT guided Bx d/w dr Delynn Flavin >> likley fibroid  Appended Document: f/u after ct ///kp pl arrange FU next week to discuss next step.  Appended Document: f/u after ct ///kp Marylene Land will you please arrange f/u with RA for next week.  Thanks EWJ  Appended Document: f/u after ct ///kp pt schedulled for 04/02/2009@ 3:30 - daughter will translate

## 2010-12-28 NOTE — Letter (Signed)
[facility]: [facility]   Imported By: Sherian Rein 12/15/2009 07:46:10  _____________________________________________________________________  External Attachment:    Type:   Image     Comment:   External Document

## 2010-12-28 NOTE — Consult Note (Signed)
Summary: Fibroid/[facility]-Dr[facility]Paola A. Duard Brady M.D.  Fibroid/[facility]-Dr.Paola A. Duard Brady M.D.   Imported By: Sherian Rein 03/26/2009 11:38:10  _____________________________________________________________________  External Attachment:    Type:   Image     Comment:   External Document

## 2010-12-28 NOTE — Assessment & Plan Note (Signed)
Summary: persistant right lung infiltrates/apc   Visit Type:  Initial Consult Referred by:  alva PCP:  urgent care  Chief Complaint:  pulmonary consult........lung infection.......Marland Kitchenhad H1N1......Marland Kitchenreviewed meds....SOB.  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 >> Given augmentin x 10 ds She feels 80% btter, occasional DOE, clear phlegm now.Denies fevers, wt loss, appetite OK PPD status unknown, No childhood h/o TB     Updated Prior Medication List: AUGMENTIN 875-125 MG TABS (AMOXICILLIN-POT CLAVULANATE) once daily    Past Medical History:    lung infection    pneumonia   Family History:    heart disease- mother, brother  Social History:    lives with husband and children - immigrated in 1990    occupation--sews    3 dogs and 4 cats   Risk Factors:  Tobacco use:  never Alcohol use:  no   Review of Systems       The patient complains of dyspnea on exertion.  The patient denies anorexia, fever, weight loss, weight gain, vision loss, decreased hearing, hoarseness, chest pain, syncope, peripheral edema, prolonged cough, headaches, hemoptysis, abdominal pain, melena, hematochezia, severe indigestion/heartburn, hematuria, incontinence, genital sores, muscle weakness, suspicious skin lesions, transient blindness, difficulty walking, depression, unusual weight change, abnormal bleeding, enlarged lymph nodes, angioedema, breast masses, and testicular masses.     Vital Signs:  Patient Profile:   50 Years Old Female Height:     61 inches Weight:      130.50 pounds BMI:     24.75 O2 Sat:      99 % O2 treatment:    Room Air Temp:     98.2 degrees F oral Pulse rate:   80 /  minute BP sitting:   130 / 80  (left arm) Cuff size:   regular  Pt. in pain?   no                  Physical Exam  General:     normal appearance and healthy appearing.   HEENT - no thrush, no post nasal drip No JVD, no lymphadenopathy CVS- s1s2 nml, no murmur RS- clear, no crackles or rhonchi, decreased BS & vocal resonance RLL Abd- soft, non-tender, no organomegaly CNS- non focal Ext - no edema     Pulmonary Function Test Height (in.): 61 Gender: Female    Impression & Recommendations:  Problem # 1:  BACTERIAL PNEUMONIA (ICD-482.9) Persistent ASD in 4 weeks is not surprising, clinically much improved. Likely viral with superimposed bacterial pneumonia- community acquired, good response to Augmentin, Will need CXR f/u to resolution - I have explained that if CXR does not resolve by January, then we can pursue further diagnostic measures.No need for further ABX at this time. Rpt CXR week before christmas The following medications were removed from the medication list:    Avelox 400 Mg Tabs (Moxifloxacin hcl) ..... Once daily until gone  Her updated medication list for this problem includes:    Augmentin 875-125 Mg Tabs (Amoxicillin-pot clavulanate) ..... Once daily  Orders: T-2 View CXR (71020TC) Consultation Level III (16109)   Medications Added to Medication List This Visit: 1)  Augmentin 875-125 Mg Tabs (Amoxicillin-pot clavulanate) .... Once daily  Patient Instructions: 1)  Please schedule a follow-up appointment in 6-8 weeks. 2)  A chest x-ray has been recommended in the week before christmas.  Your imaging study may require preauthorization.    ]

## 2010-12-30 NOTE — Letter (Signed)
Summary: [facility]  [facility]   Imported By: Sherian Rein 12/24/2010 08:10:11  _____________________________________________________________________  External Attachment:    Type:   Image     Comment:   External Document

## 2011-02-13 LAB — GLUCOSE, CAPILLARY: Glucose-Capillary: 99 mg/dL (ref 70–99)

## 2011-02-15 ENCOUNTER — Ambulatory Visit (HOSPITAL_COMMUNITY)
Admission: RE | Admit: 2011-02-15 | Discharge: 2011-02-15 | Disposition: A | Discharge status: Home / Self Care | Payer: 59 | Source: Ambulatory Visit | Attending: Internal Medicine | Admitting: Internal Medicine

## 2011-02-15 ENCOUNTER — Encounter (HOSPITAL_BASED_OUTPATIENT_CLINIC_OR_DEPARTMENT_OTHER): Payer: 59 | Admitting: Internal Medicine

## 2011-02-15 ENCOUNTER — Other Ambulatory Visit: Payer: Self-pay | Admitting: Internal Medicine

## 2011-02-15 DIAGNOSIS — R222 Localized swelling, mass and lump, trunk: Secondary | ICD-10-CM | POA: Insufficient documentation

## 2011-02-15 DIAGNOSIS — C3430 Malignant neoplasm of lower lobe, unspecified bronchus or lung: Secondary | ICD-10-CM

## 2011-02-15 DIAGNOSIS — R0789 Other chest pain: Secondary | ICD-10-CM | POA: Insufficient documentation

## 2011-02-15 DIAGNOSIS — I517 Cardiomegaly: Secondary | ICD-10-CM | POA: Insufficient documentation

## 2011-02-15 DIAGNOSIS — Z902 Acquired absence of lung [part of]: Secondary | ICD-10-CM | POA: Insufficient documentation

## 2011-02-15 DIAGNOSIS — C3490 Malignant neoplasm of unspecified part of unspecified bronchus or lung: Secondary | ICD-10-CM | POA: Insufficient documentation

## 2011-02-15 LAB — CBC WITH DIFFERENTIAL/PLATELET
BASO%: 0.9 % (ref 0.0–2.0)
Basophils Absolute: 0.1 10*3/uL (ref 0.0–0.1)
EOS%: 0.3 % (ref 0.0–7.0)
Eosinophils Absolute: 0 10*3/uL (ref 0.0–0.5)
HCT: 32.9 % — ABNORMAL LOW (ref 34.8–46.6)
HGB: 10.7 g/dL — ABNORMAL LOW (ref 11.6–15.9)
LYMPH%: 24.2 % (ref 14.0–49.7)
MCH: 23 pg — ABNORMAL LOW (ref 25.1–34.0)
MCHC: 32.7 g/dL (ref 31.5–36.0)
MCV: 70.4 fL — ABNORMAL LOW (ref 79.5–101.0)
MONO#: 0.7 10*3/uL (ref 0.1–0.9)
MONO%: 7 % (ref 0.0–14.0)
NEUT#: 7 10*3/uL — ABNORMAL HIGH (ref 1.5–6.5)
NEUT%: 67.6 % (ref 38.4–76.8)
Platelets: 315 10*3/uL (ref 145–400)
RBC: 4.67 10*6/uL (ref 3.70–5.45)
RDW: 18.1 % — ABNORMAL HIGH (ref 11.2–14.5)
WBC: 10.4 10*3/uL — ABNORMAL HIGH (ref 3.9–10.3)
lymph#: 2.5 10*3/uL (ref 0.9–3.3)

## 2011-02-15 LAB — CMP (CANCER CENTER ONLY)
ALT(SGPT): 24 U/L (ref 10–47)
AST: 28 U/L (ref 11–38)
Albumin: 3.7 g/dL (ref 3.3–5.5)
Alkaline Phosphatase: 65 U/L (ref 26–84)
BUN, Bld: 13 mg/dL (ref 7–22)
CO2: 25 mEq/L (ref 18–33)
Calcium: 8.6 mg/dL (ref 8.0–10.3)
Chloride: 102 mEq/L (ref 98–108)
Creat: 0.5 mg/dl — ABNORMAL LOW (ref 0.6–1.2)
Glucose, Bld: 92 mg/dL (ref 73–118)
Potassium: 4.1 mEq/L (ref 3.3–4.7)
Sodium: 138 mEq/L (ref 128–145)
Total Bilirubin: 0.4 mg/dl (ref 0.20–1.60)
Total Protein: 7.3 g/dL (ref 6.4–8.1)

## 2011-02-15 MED ORDER — IOHEXOL 300 MG/ML  SOLN
80.0000 mL | Freq: Once | INTRAMUSCULAR | Status: AC | PRN
  Administered 2011-02-15: 80 mL via INTRAVENOUS

## 2011-02-22 ENCOUNTER — Encounter (HOSPITAL_BASED_OUTPATIENT_CLINIC_OR_DEPARTMENT_OTHER): Payer: 59 | Admitting: Internal Medicine

## 2011-02-22 ENCOUNTER — Other Ambulatory Visit: Payer: Self-pay | Admitting: Internal Medicine

## 2011-02-22 DIAGNOSIS — C3430 Malignant neoplasm of lower lobe, unspecified bronchus or lung: Secondary | ICD-10-CM

## 2011-02-22 DIAGNOSIS — Z85118 Personal history of other malignant neoplasm of bronchus and lung: Secondary | ICD-10-CM

## 2011-03-01 ENCOUNTER — Encounter (INDEPENDENT_AMBULATORY_CARE_PROVIDER_SITE_OTHER): Payer: 59 | Admitting: Surgery

## 2011-03-01 DIAGNOSIS — C3490 Malignant neoplasm of unspecified part of unspecified bronchus or lung: Secondary | ICD-10-CM

## 2011-03-02 NOTE — Consult Note (Signed)
NEW PATIENT CONSULTATION  [name], [name] DOB:  1961-03-21                                        March 02, 2011 CHART #:  [id number]  REFERRING PHYSICIAN:  Velora Heckler. [name], MD  REASON FOR CONSULTATION:  Progressive enlargement of soft tissue mass in the right lung status post lung cancer resection.  CLINICAL HISTORY:  I was asked to reevaluate the patient for a new progressive right lung mass status post right thoracotomy and right lower lobectomy on Apr 18, 2009, for a T2aN0Mx adenocarcinoma of the lung.  She has a CT scan of the chest in December of 2010, which showed only surgical changes in the right lower chest.  She subsequently had a head PET scan done in June of 2011, which showed an abnormal soft tissue density extending from the inferior right hilum posteriorly.  This had no abnormal hypermetabolic activity by PET scan.  She subsequently had CT scan of the chest with contrast on November 15, 2010, that showed continued progression of this abnormal soft tissue density.  It is unclear whether this could be recurrent malignancy or enlarging rounded atelectasis.  She had a repeat CT scan done on February 15, 2011, which again shows this soft tissue density in the right infrahilar region. This measures 4.4 x 3.5 cm in greatest transverse dimension which was slightly larger than on her previous scan.  There is no mediastinal or hilar adenopathy.  There is minimal focal pleural fluid adjacent to the cervical changes in the right lung.  REVIEW OF SYSTEMS:  As follows: GENERAL:  She denies any fever or chills.  She has had no loss of appetite or weight loss.  She has had no fatigue. EYES:  She has had no change in her vision or blurred vision. ENT:  Negative. ENDOCRINE:  She denies diabetes and hypothyroidism. CARDIOVASCULAR:  She has had no chest pain or pressure.  She denies exertional dyspnea.  She has had no PND or orthopnea.  She denies peripheral edema and  palpitations. RESPIRATORY:  She denies cough or sputum production.  She has had no hemoptysis. GI:  She has had no nausea or vomiting.  She denies dysphagia.  She denies melena and bright red blood per rectum. GU:  She denies dysuria and hematuria. MUSCULOSKELETAL:  She denies any muscle or bone pain. VASCULAR:  She denies claudication and phlebitis. NEUROLOGICAL:  She has had no focal weakness or numbness.  She denies dizziness and syncope.  She never had TIA or stroke.  HEMATOLOGICAL: Negative. SKIN:  She does report some thickening of her right thoracotomy scar. PSYCHIATRIC:  Negative.  ALLERGIES:  None.  PAST MEDICAL HISTORY:  Significant for childbirth x7.  Her any other medical illnesses is being right lower lobe adenocarcinoma treated with right lower lobectomy on Apr 08, 2009, by me.  SOCIAL HISTORY:  She continues to work as a Neurosurgeon.  She lives with her husband and children.  She is here with her friend today who is an interpreter because she does not speak English very well.  She denies smoking and alcohol abuse.  FAMILY HISTORY:  Significant only for heart disease in her mother and her brother.  MEDICATIONS: 1. Tylenol p.r.n. for pain. 2. Advil p.r.n. for pain.  On physical examination, her blood pressure is 131/87, pulse 84 and regular, respiratory rate is 14 and  unlabored.  Oxygen saturation on room air is 98%.  She is a well-developed Asian woman in no distress. HEENT exam shows to be normocephalic and atraumatic.  Pupils are equal and reactive to light and accommodation.  Extraocular muscles are intact.  Oropharynx is clear.  Neck exam shows normal carotid pulses bilaterally.  There are no bruits.  There is no cervical, supraclavicular, or axillary adenopathy.  Chest exam shows a well-healed right thoracotomy scar which is hypertrophied consistent with a keloid. There is no sign of infection.  There are no skin lesions.  Her lung exam is clear.  Cardiac  exam shows regular rate and rhythm with normal S1 and S2.  There is no murmur, rub, or gallop.  Abdominal exam shows active bowel sounds.  Abdomen is soft and nontender.  There are no palpable masses or organomegaly.  Extremity exam shows no peripheral edema.  Neurologic exam shows to be alert and oriented x3.  Motor and sensory exam is grossly normal.  Recent laboratory examination shows white blood cell count of 10.4, hemoglobin of 10.7, hematocrit 32.9, platelet count 315,000. Electrolytes normal with BUN of 13, creatinine 0.5.  Liver function profile is within normal limits.  Albumin is 3.7.  IMPRESSION:  The patient has a progressive soft tissue process in the right infrahilar region extending inferior and posterior in the area of the right middle lobe.  This is not a well-defined mass but is more of an infiltrative process.  It has the same appearance as her previous lung cancer did in the right lower lobe which was also slowly progressive.  This current process is negative by PET scan last June which certainly could be recurrent malignancy.  I think the best course of action is to proceed with biopsy.  Dr. Edwyna Shell, and I reviewed her films and this should be amenable to electromagnetic navigation bronchoscopy.  We will have radiology format a superDimension protocol CD and tentatively plan to perform ENB March 15, 2011, I discussed the procedure and its indications with the patient by her interpreter.  We discussed alternatives, benefits, and risks including but not limited to bleeding, injury to lung, pneumothorax, and possibility that we may not obtain a tissue diagnosis.  She understands all this and agrees to proceed.  Evelene Croon, M.D. Electronically Signed  BB/MEDQ  D:  03/02/2011  T:  03/02/2011  Job:  [id number]  cc:   Lajuana Matte, M.D.

## 2011-03-08 LAB — BASIC METABOLIC PANEL
BUN: 2 mg/dL — ABNORMAL LOW (ref 6–23)
BUN: 3 mg/dL — ABNORMAL LOW (ref 6–23)
CO2: 27 mEq/L (ref 19–32)
CO2: 30 mEq/L (ref 19–32)
Calcium: 8.1 mg/dL — ABNORMAL LOW (ref 8.4–10.5)
Calcium: 8.5 mg/dL (ref 8.4–10.5)
Chloride: 103 mEq/L (ref 96–112)
Chloride: 98 mEq/L (ref 96–112)
Creatinine, Ser: 0.38 mg/dL — ABNORMAL LOW (ref 0.4–1.2)
Creatinine, Ser: 0.49 mg/dL (ref 0.4–1.2)
GFR calc Af Amer: >60 mL/min (ref >60)
GFR calc Af Amer: >60 mL/min (ref >60)
GFR calc non Af Amer: >60 mL/min (ref >60)
GFR calc non Af Amer: >60 mL/min (ref >60)
Glucose, Bld: 103 mg/dL — ABNORMAL HIGH (ref 70–99)
Glucose, Bld: 138 mg/dL — ABNORMAL HIGH (ref 70–99)
Potassium: 3.5 mEq/L (ref 3.5–5.1)
Potassium: 4.4 mEq/L (ref 3.5–5.1)
Sodium: 137 mEq/L (ref 135–145)
Sodium: 139 mEq/L (ref 135–145)

## 2011-03-08 LAB — CBC
HCT: 29.9 % — ABNORMAL LOW (ref 36.0–46.0)
HCT: 32.3 % — ABNORMAL LOW (ref 36.0–46.0)
HCT: 36.4 % (ref 36.0–46.0)
Hemoglobin: 10.8 g/dL — ABNORMAL LOW (ref 12.0–15.0)
Hemoglobin: 12.2 g/dL (ref 12.0–15.0)
Hemoglobin: 9.9 g/dL — ABNORMAL LOW (ref 12.0–15.0)
MCHC: 33 g/dL (ref 30.0–36.0)
MCHC: 33.4 g/dL (ref 30.0–36.0)
MCHC: 33.4 g/dL (ref 30.0–36.0)
MCV: 75.7 fL — ABNORMAL LOW (ref 78.0–100.0)
MCV: 75.9 fL — ABNORMAL LOW (ref 78.0–100.0)
MCV: 76.4 fL — ABNORMAL LOW (ref 78.0–100.0)
Platelets: 285 10*3/uL (ref 150–400)
Platelets: 309 10*3/uL (ref 150–400)
Platelets: 372 10*3/uL (ref 150–400)
RBC: 3.95 MIL/uL (ref 3.87–5.11)
RBC: 4.25 MIL/uL (ref 3.87–5.11)
RBC: 4.77 MIL/uL (ref 3.87–5.11)
RDW: 16.6 % — ABNORMAL HIGH (ref 11.5–15.5)
RDW: 16.8 % — ABNORMAL HIGH (ref 11.5–15.5)
RDW: 16.9 % — ABNORMAL HIGH (ref 11.5–15.5)
WBC: 11.6 10*3/uL — ABNORMAL HIGH (ref 4.0–10.5)
WBC: 14.3 10*3/uL — ABNORMAL HIGH (ref 4.0–10.5)
WBC: 7.8 10*3/uL (ref 4.0–10.5)

## 2011-03-08 LAB — BLOOD GAS, ARTERIAL
Acid-base deficit: 0 mmol/L (ref 0.0–2.0)
Bicarbonate: 24.2 mEq/L — ABNORMAL HIGH (ref 20.0–24.0)
Drawn by: 181601
FIO2: 0.21 %
O2 Saturation: 98.1 %
Patient temperature: 98.6
TCO2: 25.4 mmol/L (ref 0–100)
pCO2 arterial: 40.1 mmHg (ref 35.0–45.0)
pH, Arterial: 7.398 (ref 7.350–7.400)
pO2, Arterial: 98.7 mmHg (ref 80.0–100.0)

## 2011-03-08 LAB — POCT I-STAT 3, ART BLOOD GAS (G3+)
Acid-Base Excess: 1 mmol/L (ref 0.0–2.0)
Bicarbonate: 26.6 mEq/L — ABNORMAL HIGH (ref 20.0–24.0)
O2 Saturation: 99 %
Patient temperature: 98.2
TCO2: 28 mmol/L (ref 0–100)
pCO2 arterial: 47 mmHg — ABNORMAL HIGH (ref 35.0–45.0)
pH, Arterial: 7.36 (ref 7.350–7.400)
pO2, Arterial: 133 mmHg — ABNORMAL HIGH (ref 80.0–100.0)

## 2011-03-08 LAB — DIFFERENTIAL
Basophils Absolute: 0 10*3/uL (ref 0.0–0.1)
Basophils Relative: 0 % (ref 0–1)
Eosinophils Absolute: 0 10*3/uL (ref 0.0–0.7)
Eosinophils Relative: 0 % (ref 0–5)
Lymphocytes Relative: 13 % (ref 12–46)
Lymphs Abs: 1.5 10*3/uL (ref 0.7–4.0)
Monocytes Absolute: 0.9 10*3/uL (ref 0.1–1.0)
Monocytes Relative: 8 % (ref 3–12)
Neutro Abs: 9.1 10*3/uL — ABNORMAL HIGH (ref 1.7–7.7)
Neutrophils Relative %: 79 % — ABNORMAL HIGH (ref 43–77)

## 2011-03-08 LAB — COMPREHENSIVE METABOLIC PANEL
ALT: 17 U/L (ref 0–35)
ALT: 18 U/L (ref 0–35)
AST: 22 U/L (ref 0–37)
AST: 25 U/L (ref 0–37)
Albumin: 2.8 g/dL — ABNORMAL LOW (ref 3.5–5.2)
Albumin: 3.6 g/dL (ref 3.5–5.2)
Alkaline Phosphatase: 40 U/L (ref 39–117)
Alkaline Phosphatase: 57 U/L (ref 39–117)
BUN: 3 mg/dL — ABNORMAL LOW (ref 6–23)
BUN: 7 mg/dL (ref 6–23)
CO2: 24 mEq/L (ref 19–32)
CO2: 30 mEq/L (ref 19–32)
Calcium: 8.2 mg/dL — ABNORMAL LOW (ref 8.4–10.5)
Calcium: 9 mg/dL (ref 8.4–10.5)
Chloride: 101 mEq/L (ref 96–112)
Chloride: 107 mEq/L (ref 96–112)
Creatinine, Ser: 0.42 mg/dL (ref 0.4–1.2)
Creatinine, Ser: 0.5 mg/dL (ref 0.4–1.2)
GFR calc Af Amer: >60 mL/min (ref >60)
GFR calc Af Amer: >60 mL/min (ref >60)
GFR calc non Af Amer: >60 mL/min (ref >60)
GFR calc non Af Amer: >60 mL/min (ref >60)
Glucose, Bld: 116 mg/dL — ABNORMAL HIGH (ref 70–99)
Glucose, Bld: 89 mg/dL (ref 70–99)
Potassium: 3.8 mEq/L (ref 3.5–5.1)
Potassium: 4.3 mEq/L (ref 3.5–5.1)
Sodium: 137 mEq/L (ref 135–145)
Sodium: 139 mEq/L (ref 135–145)
Total Bilirubin: 0.4 mg/dL (ref 0.3–1.2)
Total Bilirubin: 0.4 mg/dL (ref 0.3–1.2)
Total Protein: 5.7 g/dL — ABNORMAL LOW (ref 6.0–8.3)
Total Protein: 6.6 g/dL (ref 6.0–8.3)

## 2011-03-08 LAB — TYPE AND SCREEN
ABO/RH(D): O POS
Antibody Screen: NEGATIVE

## 2011-03-08 LAB — URINALYSIS, ROUTINE W REFLEX MICROSCOPIC
Bilirubin Urine: NEGATIVE
Glucose, UA: NEGATIVE mg/dL
Hgb urine dipstick: NEGATIVE
Ketones, ur: NEGATIVE mg/dL
Nitrite: NEGATIVE
Protein, ur: NEGATIVE mg/dL
Specific Gravity, Urine: 1.021 (ref 1.005–1.030)
Urobilinogen, UA: 0.2 mg/dL (ref 0.0–1.0)
pH: 7 (ref 5.0–8.0)

## 2011-03-08 LAB — ABO/RH: ABO/RH(D): O POS

## 2011-03-08 LAB — APTT: aPTT: 35 seconds (ref 24–37)

## 2011-03-08 LAB — PROTIME-INR
INR: 0.9 (ref 0.00–1.49)
Prothrombin Time: 12.4 seconds (ref 11.6–15.2)

## 2011-03-09 ENCOUNTER — Encounter (HOSPITAL_COMMUNITY)
Admission: RE | Admit: 2011-03-09 | Discharge: 2011-03-09 | Disposition: A | Discharge status: Home / Self Care | Payer: 59 | Source: Ambulatory Visit | Attending: Thoracic Surgery | Admitting: Thoracic Surgery

## 2011-03-09 LAB — PROTIME-INR
INR: 0.87 (ref 0.00–1.49)
Prothrombin Time: 12 seconds (ref 11.6–15.2)

## 2011-03-09 LAB — CBC
HCT: 39 % (ref 36.0–46.0)
Hemoglobin: 12.1 g/dL (ref 12.0–15.0)
MCH: 22.7 pg — ABNORMAL LOW (ref 26.0–34.0)
MCHC: 31 g/dL (ref 30.0–36.0)
MCV: 73 fL — ABNORMAL LOW (ref 78.0–100.0)
Platelets: 375 10*3/uL (ref 150–400)
RBC: 5.34 MIL/uL — ABNORMAL HIGH (ref 3.87–5.11)
RDW: 17.3 % — ABNORMAL HIGH (ref 11.5–15.5)
WBC: 11.7 10*3/uL — ABNORMAL HIGH (ref 4.0–10.5)

## 2011-03-09 LAB — HCG, SERUM, QUALITATIVE: Preg, Serum: NEGATIVE

## 2011-03-09 LAB — COMPREHENSIVE METABOLIC PANEL
ALT: 28 U/L (ref 0–35)
AST: 32 U/L (ref 0–37)
Albumin: 4.2 g/dL (ref 3.5–5.2)
Alkaline Phosphatase: 61 U/L (ref 39–117)
BUN: 8 mg/dL (ref 6–23)
CO2: 28 mEq/L (ref 19–32)
Calcium: 9.7 mg/dL (ref 8.4–10.5)
Chloride: 103 mEq/L (ref 96–112)
Creatinine, Ser: 0.49 mg/dL (ref 0.4–1.2)
GFR calc Af Amer: >60 mL/min (ref >60)
GFR calc non Af Amer: >60 mL/min (ref >60)
Glucose, Bld: 94 mg/dL (ref 70–99)
Potassium: 4.6 mEq/L (ref 3.5–5.1)
Sodium: 137 mEq/L (ref 135–145)
Total Bilirubin: 0.4 mg/dL (ref 0.3–1.2)
Total Protein: 7.3 g/dL (ref 6.0–8.3)

## 2011-03-09 LAB — SURGICAL PCR SCREEN
MRSA, PCR: NEGATIVE
Staphylococcus aureus: POSITIVE — AB

## 2011-03-09 LAB — GLUCOSE, CAPILLARY: Glucose-Capillary: 90 mg/dL (ref 70–99)

## 2011-03-09 LAB — APTT: aPTT: 35 seconds (ref 24–37)

## 2011-03-14 LAB — CULTURE, RESPIRATORY W GRAM STAIN: Gram Stain: NONE SEEN

## 2011-03-14 LAB — AFB CULTURE WITH SMEAR (NOT AT ARMC): Acid Fast Smear: NONE SEEN

## 2011-03-14 LAB — FUNGUS CULTURE W SMEAR: Fungal Smear: NONE SEEN

## 2011-03-15 ENCOUNTER — Other Ambulatory Visit: Payer: Self-pay | Admitting: Surgery

## 2011-03-15 ENCOUNTER — Other Ambulatory Visit: Payer: Self-pay | Admitting: Thoracic Surgery

## 2011-03-15 ENCOUNTER — Ambulatory Visit (HOSPITAL_COMMUNITY)
Admission: RE | Admit: 2011-03-15 | Discharge: 2011-03-15 | Disposition: A | Discharge status: Home / Self Care | Payer: 59 | Source: Ambulatory Visit | Attending: Thoracic Surgery | Admitting: Thoracic Surgery

## 2011-03-15 ENCOUNTER — Ambulatory Visit (HOSPITAL_COMMUNITY): Payer: 59

## 2011-03-15 DIAGNOSIS — Z85118 Personal history of other malignant neoplasm of bronchus and lung: Secondary | ICD-10-CM | POA: Insufficient documentation

## 2011-03-15 DIAGNOSIS — Z01811 Encounter for preprocedural respiratory examination: Secondary | ICD-10-CM

## 2011-03-15 DIAGNOSIS — Z01818 Encounter for other preprocedural examination: Secondary | ICD-10-CM | POA: Insufficient documentation

## 2011-03-15 DIAGNOSIS — D381 Neoplasm of uncertain behavior of trachea, bronchus and lung: Secondary | ICD-10-CM

## 2011-03-15 DIAGNOSIS — R222 Localized swelling, mass and lump, trunk: Secondary | ICD-10-CM | POA: Insufficient documentation

## 2011-03-22 ENCOUNTER — Encounter (INDEPENDENT_AMBULATORY_CARE_PROVIDER_SITE_OTHER): Payer: 59 | Admitting: Surgery

## 2011-03-22 DIAGNOSIS — D491 Neoplasm of unspecified behavior of respiratory system: Secondary | ICD-10-CM

## 2011-03-22 DIAGNOSIS — C3490 Malignant neoplasm of unspecified part of unspecified bronchus or lung: Secondary | ICD-10-CM

## 2011-03-23 ENCOUNTER — Other Ambulatory Visit: Payer: Self-pay | Admitting: Surgery

## 2011-03-23 DIAGNOSIS — R918 Other nonspecific abnormal finding of lung field: Secondary | ICD-10-CM

## 2011-03-23 DIAGNOSIS — Z85118 Personal history of other malignant neoplasm of bronchus and lung: Secondary | ICD-10-CM

## 2011-03-23 NOTE — Assessment & Plan Note (Signed)
OFFICE VISIT  [name], [name] DOB:  1961/11/14                                        March 22, 2011 CHART #:  [id number]  The patient returned to my office today for followup status post flexible fiberoptic bronchoscopy, endobronchial ultrasound with biopsy, and electromagnetic navigation bronchoscopy with biopsy of an enlarging soft tissue mass in the right lung status post right lower lobectomy for a T2a N0 Mx adenocarcinoma of the lung on Apr 18, 2009.  She had a followup CT scan on November 13, 2009, that showed a soft tissue density around the surgical site which is felt to be related to scar.  She subsequently had another CT scan done in June of 2011, which showed some progression in the size of this soft tissue density in the posterior medial aspect of the right chest.  Subsequent PET scan in June of 2011, showed no hypermetabolic uptake within this soft tissue density.  She had a repeat CT scan in December of 2011, which showed that the soft tissue density had progressed with increase in size.  Repeat CT scan in March of 2012, showed slight progression in the size of this soft tissue density in the right lung.  We decided to proceed ahead with biopsy as noted above.  There were no abnormalities noted on bronchoscopy. Multiple biopsy specimens were obtained.  Only one biopsy specimen showed rare groups of atypical cells with prominent nucleoli without cilia, but the cytomorphologic features were not sufficient for definitive diagnosis.  All of the other biopsy specimens showed no evidence of malignancy.  The washings were negative for malignant cells. We took multiple biopsies and were sure that we were in the lesion.  Since the procedure, she said that she had about a 1-week period of scratchy throat, but this has resolved.  She denies any hemoptysis.  She has had no shortness of breath.  On physical examination today, blood pressure 139/90, pulse 86  and regular, respirations 16 unlabored.  Oxygen saturation on room air is 99%.  She looks well.  Her lung exam is clear.  IMPRESSION:  The patient has a slowly progressive soft tissue density or mass in the medial posterior aspect of the right lung.  Given her history of lung cancer resection, this is concerning for recurrent malignant disease.  She presented with her original cancer being a quite large mass in the right lower lobe which was positive on PET scan.  This new soft tissue density has the same general appearance as her previous cancer, although has been slowly progressive.  Since we are not able to prove that this was a malignant process, I will plan to schedule a PET scan to further evaluate this.  It may be best to proceed with a CT- guided needle biopsy of the soft tissue mass and PET scan may be of benefit if part of the mass lights up and may direct the CT-guided needle biopsy.  I had discussed all of this with the patient through her friend and daughter acting as interpreters.  As she understands and agrees to proceed with PET scan and I will plan to see her back after that.  Evelene Croon, M.D. Electronically Signed  BB/MEDQ  D:  03/22/2011  T:  03/23/2011  Job:  [id number]  cc:   Lajuana Matte, M.D.

## 2011-03-28 ENCOUNTER — Encounter (HOSPITAL_COMMUNITY)
Admission: RE | Admit: 2011-03-28 | Discharge: 2011-03-28 | Disposition: A | Discharge status: Home / Self Care | Payer: 59 | Source: Ambulatory Visit | Attending: Surgery | Admitting: Surgery

## 2011-03-28 DIAGNOSIS — R918 Other nonspecific abnormal finding of lung field: Secondary | ICD-10-CM

## 2011-03-28 DIAGNOSIS — D259 Leiomyoma of uterus, unspecified: Secondary | ICD-10-CM | POA: Insufficient documentation

## 2011-03-28 DIAGNOSIS — C3490 Malignant neoplasm of unspecified part of unspecified bronchus or lung: Secondary | ICD-10-CM | POA: Insufficient documentation

## 2011-03-28 DIAGNOSIS — Z85118 Personal history of other malignant neoplasm of bronchus and lung: Secondary | ICD-10-CM

## 2011-03-28 LAB — GLUCOSE, CAPILLARY: Glucose-Capillary: 95 mg/dL (ref 70–99)

## 2011-03-28 MED ORDER — FLUDEOXYGLUCOSE F - 18 (FDG) INJECTION
16.9000 | Freq: Once | INTRAVENOUS | Status: AC | PRN
  Administered 2011-03-28: 16.9 via INTRAVENOUS

## 2011-03-29 ENCOUNTER — Encounter (INDEPENDENT_AMBULATORY_CARE_PROVIDER_SITE_OTHER): Payer: 59 | Admitting: Surgery

## 2011-03-29 DIAGNOSIS — D491 Neoplasm of unspecified behavior of respiratory system: Secondary | ICD-10-CM

## 2011-03-29 NOTE — Op Note (Signed)
[name], [name] NO.:  [id number]  MEDICAL RECORD NO.:  [id number]           PATIENT TYPE:  O  LOCATION:  XRAY                         FACILITY:  [facility]  PHYSICIAN:  Evelene Croon, M.D.     DATE OF BIRTH:  [birth year]  DATE OF PROCEDURE:  03/15/2011 DATE OF DISCHARGE:                              OPERATIVE REPORT   PREOPERATIVE DIAGNOSIS:  Soft tissue mass in the right lung status post prior lung cancer resection.  POSTOPERATIVE DIAGNOSIS:  Soft tissue mass in the right lung status post prior lung cancer resection.  PROCEDURES: 1. Flexible fiberoptic bronchoscopy. 2. Endobronchial ultrasound with biopsy. 3. Electromagnetic navigation bronchoscopy with biopsy.  SURGEON:  Evelene Croon, MD  ASSISTANT:  Ines Bloomer, MD  ANESTHESIA:  General endotracheal.  CLINICAL HISTORY:  This patient is a 50 year old Asian woman who I operated on Apr 18, 2009, performing right thoracotomy and right lower lobectomy for a T2a N0 MX adenocarcinoma of the lung.  She was seen by Dr. Arbutus Ped, but did not want any further therapy and is continued to be followed.  She had a CT scan of the chest in December 2010, which showed only surgical changes in the right lower chest.  She subsequently had a PET scan done in June 2011, which showed an abnormal soft tissue density extending from the inferior right hilum posteriorly.  This had no abnormal hypermetabolic activity.  She then had a CT scan of the chest with contrast on November 15, 2010, which showed continued progression of this abnormal soft tissue density.  It is unclear whether this could represent some recurrent malignancy or enlarging rounded atelectasis.  A repeat CT scan was done on February 15, 2011, which again showed the soft tissue density in the right infrahilar region that measured 4.4 x 3.5 cm in greatest transverse dimension and was slightly larger than on her previous scan.  There was no mediastinal or  hilar adenopathy.  There is minimal focal pleural fluid adjacent to the surgical changes in the right lung.  After review of the scans and examination of the patient and review of the case with Dr. Edwyna Shell, we decided that it would be best to proceed with endobronchial ultrasound and ENB to try to get diagnosis.  Her recent CT scan was reformatted using a super dimension protocol.  I discussed the diagnostic options with the patient through her friend who is the interpreter.  We discussed alternatives of performing a CT-guided needle biopsy and she decided that the endobronchial biopsy would be the best choice for her.  I discussed benefits and risks including, but not limited to bleeding, injury to the bronchial tree or lung, pneumothorax, and inability to obtain a firm tissue diagnosis of cancer.  She understood all of this and agreed to proceed.  OPERATIVE PROCEDURE:  The patient was taken to the operating room and placed on the table in a supine position.  After the induction of general endotracheal anesthesia, flexible fiberoptic bronchoscopy was performed.  The distal trachea appeared normal.  The carina was sharp. The left bronchial tree had  normal segmental anatomy with no endobronchial lesions and no secretions.  The bronchoscope was advanced down the right mainstem bronchus.  The right upper lobe and right middle lobe bronchi had normal segmental anatomy.  There was no sign of extrinsic compression and no endobronchial lesions.  The stump of the previous right lower lobectomy was identified and was within normal limits.  Then the flexible bronchoscope was removed.  The endobronchial shown scope was inserted.  The scope was advanced down to the carina between the right middle lobe and the right lower lobe bronchial stump. The soft tissue area question was identified using endobronchial ultrasound.  We used Doppler to be sure that we are not biopsying any large blood vessels.   Using the endobronchial ultrasound, we performed several needle biopsies in the soft tissue mass in question.  These were immediately placed on slides and sent to Pathology for examination. There was complete hemostasis.  Then the endobronchial ultrasound was removed.  We then performed electromagnetic navigation bronchoscopy for biopsy.  ENB scope was then inserted to the endotracheal tube.  The LG was inserted to the end of the scope so that we could just view the tip beyond the end of the scope.  We then performed registration passing the scope and LG down to the main carina, then down into the left bronchial tree segmental bronchi, and then into the right bronchial tree segmental bronchi.  After registration was complete, we had a good representation of the bronchial tree.  Then the pathway to the best soft tissue mass in question was brought up onto the screen and the LG was used to navigate down to a spot adjacent to the carina between the right lower lobe bronchial stump and the right middle lobe bronchus.  A slot was marked in this area so that we were about 1.3 cm away from the soft tissue mass and then the LG was removed.  We used the Way A and G needle through the scope passing it out through the marked spot into the soft tissue mass. Multiple passes were taken using the needle and we confirmed each time with the LG that we were remaining in the proper position.  There was complete hemostasis.  The specimens were all sent to Pathology.  The pathologist called the initial endobronchial ultrasound specimen and said there was no evidence of cancer.  The remaining specimens are pending.  The procedure was then concluded and the patient was extubated and transferred to the Post Anesthesia Care Unit in a satisfactory and stable condition.     Evelene Croon, M.D.     BB/MEDQ  D:  03/15/2011  T:  03/15/2011  Job:  [id number]  Electronically Signed by Evelene Croon M.D. on 03/29/2011  11:25:01 AM

## 2011-03-30 NOTE — Assessment & Plan Note (Signed)
OFFICE VISIT  EPSIE, WALTHALL DOB:  07-01-1961                                        Mar 29, 2011 CHART #:  [id number]  The patient returned to my office today for followup of her recent PET scan on March 28, 2011.  She has been feeling well overall.  She denies any cough or hemoptysis.  She has had no shortness of breath.  On exam, blood pressure 128/87, pulse 86 and regular, respiratory rate 16 and unlabored.  Oxygen saturation on room air is 98-99%.  She looks well.  Her lung exam is clear.  PET scan shows heterogeneous low-level hypermetabolic activity within a maximum SUV of 2.6 within the area of soft tissue density in the right lung.  There is no hypermetabolic mediastinal lymphadenopathy identified and no other hypermetabolic masses or adenopathy identified within the chest or elsewhere.  She does have a large fibroid uterus that is stable.  IMPRESSION:  I think we have to be suspicious about recurrent cancer given the fact this soft tissue density has been increasing in size over time and she has low-level hypermetabolic activity within this area. This is similar to the presentation of original cancer.  All of her bronchoscopic biopsies were negative, although one of her biopsies had a few atypical cells that were nondiagnostic.  I think it may be best to proceed ahead with CT-guided needle biopsy of the area to try to make a diagnosis of recurrent cancer.  It is always possible this could be an inflammatory process, but I would not expect to be slowly enlarging over time.  I discussed the scan results and my recommendations with her, by her friend who is an interpreter.  She is in agreement with proceeding with CT-guided needle biopsy.  We will schedule this as quickly as possible and I will plan to see her back afterwards for discussion of the results.  Evelene Croon, M.D. Electronically Signed  BB/MEDQ  D:  03/29/2011  T:  03/30/2011  Job:   [id number]  cc:   Lajuana Matte, M.D.

## 2011-04-05 ENCOUNTER — Other Ambulatory Visit: Payer: Self-pay | Admitting: Surgery

## 2011-04-05 DIAGNOSIS — R918 Other nonspecific abnormal finding of lung field: Secondary | ICD-10-CM

## 2011-04-08 ENCOUNTER — Ambulatory Visit (HOSPITAL_COMMUNITY)
Admission: RE | Admit: 2011-04-08 | Discharge: 2011-04-08 | Disposition: A | Discharge status: Home / Self Care | Payer: 59 | Source: Ambulatory Visit | Attending: Thoracic Surgery | Admitting: Thoracic Surgery

## 2011-04-08 ENCOUNTER — Other Ambulatory Visit: Payer: Self-pay | Admitting: Interventional Radiology

## 2011-04-08 ENCOUNTER — Encounter (HOSPITAL_COMMUNITY): Payer: Self-pay

## 2011-04-08 ENCOUNTER — Ambulatory Visit (HOSPITAL_COMMUNITY)
Admission: RE | Admit: 2011-04-08 | Discharge: 2011-04-08 | Disposition: A | Discharge status: Home / Self Care | Payer: 59 | Source: Ambulatory Visit | Attending: Surgery | Admitting: Surgery

## 2011-04-08 ENCOUNTER — Inpatient Hospital Stay (HOSPITAL_COMMUNITY): Admit: 2011-04-08 | Payer: Self-pay

## 2011-04-08 ENCOUNTER — Other Ambulatory Visit: Payer: Self-pay | Admitting: Thoracic Surgery

## 2011-04-08 DIAGNOSIS — Z01811 Encounter for preprocedural respiratory examination: Secondary | ICD-10-CM

## 2011-04-08 DIAGNOSIS — R918 Other nonspecific abnormal finding of lung field: Secondary | ICD-10-CM

## 2011-04-08 DIAGNOSIS — C3430 Malignant neoplasm of lower lobe, unspecified bronchus or lung: Secondary | ICD-10-CM | POA: Insufficient documentation

## 2011-04-08 DIAGNOSIS — Z85118 Personal history of other malignant neoplasm of bronchus and lung: Secondary | ICD-10-CM | POA: Insufficient documentation

## 2011-04-08 DIAGNOSIS — Z01812 Encounter for preprocedural laboratory examination: Secondary | ICD-10-CM | POA: Insufficient documentation

## 2011-04-08 DIAGNOSIS — Z01818 Encounter for other preprocedural examination: Secondary | ICD-10-CM | POA: Insufficient documentation

## 2011-04-08 DIAGNOSIS — Z902 Acquired absence of lung [part of]: Secondary | ICD-10-CM | POA: Insufficient documentation

## 2011-04-08 LAB — CBC
HCT: 36.7 % (ref 36.0–46.0)
Hemoglobin: 11.6 g/dL — ABNORMAL LOW (ref 12.0–15.0)
MCH: 22.7 pg — ABNORMAL LOW (ref 26.0–34.0)
MCHC: 31.6 g/dL (ref 30.0–36.0)
MCV: 71.7 fL — ABNORMAL LOW (ref 78.0–100.0)
Platelets: 289 10*3/uL (ref 150–400)
RBC: 5.12 MIL/uL — ABNORMAL HIGH (ref 3.87–5.11)
RDW: 17.2 % — ABNORMAL HIGH (ref 11.5–15.5)
WBC: 8.8 10*3/uL (ref 4.0–10.5)

## 2011-04-08 LAB — APTT: aPTT: 37 seconds (ref 24–37)

## 2011-04-08 LAB — PROTIME-INR
INR: 0.87 (ref 0.00–1.49)
Prothrombin Time: 12 seconds (ref 11.6–15.2)

## 2011-04-12 NOTE — Consult Note (Signed)
[name], [name]                     ACCOUNT NO.:  [id number]   MEDICAL RECORD NO.:  [id number]          PATIENT TYPE:  OUT   LOCATION:  GYN                          FACILITY:  [facility]   PHYSICIAN:  Paola A. Duard Brady, MD    DATE OF BIRTH:  15-Dec-1960   DATE OF CONSULTATION:  03/17/2009  DATE OF DISCHARGE:                                 CONSULTATION   REFERRING PHYSICIAN:  Oretha Milch, MD.   The patient is seen today in consultation at the request of Dr. Cyril Mourning.  The patient is a 50 year old gravida 7, para 7 who was in her  usual state of health until November of 2009 at which time she was  diagnosed with a right lower lobe infiltrate and pneumonia.  She  subsequently had a workup that included a bronchoscopy that revealed no  endobronchial lesions, negative AFB fungal changes.  She had a CT scan  of the chest in October 19, 2008 that revealed confluent air space  opacity involving the right lower lobe consistent with pneumonia.  No  endobronchial lesions.  No effusions and she was treated with Augmentin  for 10 days.  PPD status is unknown.  There is no history of childhood  TB.  She underwent a PET scan for further workup and evaluation of her  pulmonary changes.  PET scan on February 20, 2009 revealed persistent air  space opacity occupying the right lower lobe, has not significantly  changed since her prior chest CT from November 22.  There is no obvious  endobronchial lesion.  In the right lower lobe there were two small  nodular densities adjacent to the air space opacity.  There is a 5 mm  nodule and an 11 mm nodule on chest CT from March 29.  Because of these  changes they recommended that she undergo a PET CT.  PET CT was  performed on March 06, 2009.  It revealed a large area of persistent mass  like air space consolidation in the right lower lobe with additional  ground-glass in the superior segment of the right lower lobe.  They did  not appear to be significantly  changed from her baseline CT of the chest  November 22.  The greatest area of hypermetabolism is seen on the  superior lateral aspect of the air space consolidation in the superior  segment of the right lower lobe measuring 4.8.  CT images showed a mass  in the central anatomic pelvis measuring approximately 14.5 x 10.1 cm,  SUVs of 7.5.  Exerted a left forward mass effect on the uterus with  which it is contiguous and a downward effect on the bladder.  Because of  these pelvic changes, she was subsequent referred to [place].  The patient  comes in today with her sister-in-law and her daughter who serve as  interpreters.  The patient states that she has known about enlarging  fibroids for the past 2 years.  She has been followed by Dr. Richarda Overlie and most recently saw him in June of last  year.  She states that  she feels tired but attributes that to the pneumonia that she had.  She  denies any pelvic pain or changes.  She denies any change in her bowel  or bladder habits.  She continues to have normal monthly menses with her  last menstrual period being last week.  She does have a cough.  It is  markedly improved.  She states she has lost approximately 11 pounds  since all of this started last year.  She denies any nausea, vomiting.  She did have fevers in the past but none now.   PAST MEDICAL HISTORY:  Significant for the pulmonary issues as described  above.   MEDICATIONS:  Tylenol p.r.n.   ALLERGIES:  None.   PAST SURGICAL HISTORY:  None.   SOCIAL HISTORY:  She denies tobacco or alcohol.  She works at a Administrator.  She is married.   FAMILY HISTORY:  Negative for any cancer.   HEALTH MAINTENANCE:  She had a mammogram 6 months ago.  She had a Pap  smear 6 months ago which was negative.   PAST GYNECOLOGIC HISTORY:  She is a gravida 7, para 7 who has continued  to have normal monthly menses.  Ultrasound reports were obtained from  Dr. Dennie Bible office.  March 26, 2007 she  had a pelvic ultrasound which  revealed the uterus to be enlarged measuring 11.7 x 5.4 x 6.2 cm with a  large fibroid in the posterior rightward aspect of the uterus measuring  8.9 x 7.8 x 7.6 cm.  There was normal endometrial stripe.  Followup  ultrasound in Dr. Dennie Bible office on May 06, 2008 revealed the uterus  measuring 11.6 x 7.7 x 11.3 cm with a normal endometrial stripe  thickness.  The adnexa were unremarkable.  There is a 10.7 x 7.5 x 9.2  cm fibroid and a separate 3.2 cm fibroid seen.  There was no free fluid.   PHYSICAL EXAMINATION:  Weight 124 pounds, blood pressure 110/70, pulse  80.  Well-nourished, well-developed female in no acute distress.  NECK:  Supple.  There is no lymphadenopathy, no thyromegaly.  LUNGS:  Clear to auscultation bilaterally.  CARDIOVASCULAR EXAM:  Regular rate and rhythm.  ABDOMEN:  Reveals a palpable abdominal pelvic mass in the lower portion  of the abdomen in the suprapubic region.  It is nontender, it is freely  mobile just on abdominal examination.  The mass is more prominent on her  left than on her right.  Groins are negative for adenopathy.  EXTREMITIES:  There is no edema.  PELVIC:  External genitalia is within normal limits.  She has a small  freckle on her left labia majora.  Vagina is well epithelialized.  Cervix is visualized.  It is multiparous.  There are no visible lesions  or discharge.  Bimanual examination reveals approximately a 14 cm  abdominal pelvic mass consistent with fibroids if all contiguous.  The  fibroids appear to be somewhat distorting the uterus with the larger  portion of the fibroid being towards the patient's left side.  Rectovaginal confirms no nodularity or masses.   ASSESSMENT:  1. [age]-year-old with probable fibroid uterus.  It does not      appear to be appreciably enlarged as compared to prior ultrasound      which we have most recently in June of 2009.  After discussion with      the patient today,  she wishes to have another ultrasound done  here      at [facility].  This is arranged for 2 o'clock today.  I will      contact the patient at home at [phone] or her daughter's cell      phone at [phone] with the results of the ultrasound when they      are available.  We have conferred with Dr. Dennie Bible office and      they are aware of the patient, and will be getting her back into      their office.  2. The patient was supposed to see her thoracic surgeon today but that      needed to be rescheduled due to an emergency.  They were given the      phone number to call and reschedule that appointment.  Should the      ultrasound today be consistent with primarily this being a fibroid      process, I would recommend that her workup      with her VATS be the first priority and the fibroid issue could be      addressed secondarily.  This was discussed with the patient, her      sister-in-law and her daughter.  They appeared to understand.      Again, I will contact them with the results of the ultrasound.      Paola A. Duard Brady, MD  Electronically Signed     PAG/MEDQ  D:  03/17/2009  T:  03/17/2009  Job:  [id number]   cc:   Duke Salvia. Marcelle Overlie, M.D.  Fax: [phone]   Oretha Milch, MD  [address] [place] [address]   Telford Nab, R.N.  [phone] [address]  [place], [place] [address]

## 2011-04-12 NOTE — Assessment & Plan Note (Signed)
OFFICE VISIT   [name], [name]  DOB:  05-16-1961                                        August 04, 2009  CHART #:  [id number]   HISTORY:  The patient returned to my office today for a followup status  post right thoracotomy and right lower lobectomy on Apr 08, 2009, for a  T2a N0 MX adenocarcinoma of the lung.  She was initially referred by Dr.  Vassie Loll.  Postoperatively, she was seen by Dr. Arbutus Ped for medical oncology  evaluation and she was told that she does not require any further  treatment at this time.  She is here with her interpreter today.  She  said she still has some numbness around her right chest along the line  of the incision.  She has occasional right chest pain.  She has returned  to her job.  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  She has had no headaches or visual changes.  She has  not noticed any unusual lumps or bumps.  She has had no cough or sputum  production.   PHYSICAL EXAMINATION:  Vital Signs:  Today, blood pressure 125/85, pulse  90 and regular, respiratory rate is 16 and unlabored.  Saturation 99% on  room air.  General:  She looks well.  Cardiac:  Regular rate and rhythm  with normal heart sounds.  Lung:  Clear.  Chest:  Right chest incision  is healing well.  There are no skin lesions.  Abdomen:  Active bowel  sounds.  Abdomen is soft and nontender.  There are no palpable masses or  organomegaly.  There is no cervical or supraclavicular adenopathy.   IMPRESSION:  The patient is doing well following her lung cancer  surgery.  I explained to her that her right chest wall numbness and pain  will gradually improve overtime.  I also explained the need for ongoing  close surveillance.  I will plan to see her back in 3 months with a  repeat  chest x-ray.  I will plan to see her back every 3 months and then do a  CT scan of the  chest in 1 year.  She understands the need for close  followup.   Evelene Croon, M.D.  Electronically Signed   BB/MEDQ  D:  08/04/2009  T:  08/05/2009  Job:  [id number]

## 2011-04-12 NOTE — Discharge Summary (Signed)
NAMEMIMIE, GOERING NO.:  [id number]   MEDICAL RECORD NO.:  [id number]          PATIENT TYPE:  INP   LOCATION:  2028                         FACILITY:  [facility]   PHYSICIAN:  Evelene Croon, M.D.     DATE OF BIRTH:  [birth year]   DATE OF ADMISSION:  04/08/2009  DATE OF DISCHARGE:  04/13/2009                               DISCHARGE SUMMARY   FINAL DIAGNOSIS:  Large right lower lobe mass, positive for  adenocarcinoma with abundant mucin production T2a N0 Mx.   IN-HOSPITAL OPERATIONS AND PROCEDURES:  1. Flexible fiberoptic bronchoscopy.  2. Right video-assisted thoracoscopic surgery, right thoracotomy, and      right lower lobectomy.   HISTORY AND PHYSICAL AND HOSPITAL COURSE:  The patient is a 50 year old  [place] nonsmoker, who was initially seen in Urgent Care at the  beginning of November 2009 for flu-like symptoms.  Chest x-ray  apparently showed a right lower lobe infiltrate and she was treated with  Avelox and Tamiflu for about 2 weeks.  She was seen again in November  2009.  Chest x-ray showed persistent right lower lobe airspace disease.  CT scan of chest showed confluent airspace disease involving the right  lower lobe consistent with pneumonia.  She was treated with 10 days of  Augmentin, and a followup CT scan on October 19, 2008, was unchanged.  She returned beginning of January with dyspnea and persistent clear  sputum production.  Chest x-ray was unchanged during right lower lobe  consolidation.  She underwent flexible fiberoptic bronchoscopy, which  was remarkable with no endobronchial lesions.  Brushings, biopsy, and  washings of the right lower lobe were all negative for malignancy or  specific diagnosis.  All bronchoscopic cultures were negative including  AFB.  The patient underwent repeat CT scan on February 20, 2009 showing  persistent right lower lobe airspace opacity with no obvious obstructing  master endobronchial lesions seen.  There were  also 2 right lower lobe  pulmonary nodules adjacent to the lung opacity measuring 5 mm and 11 mm.  There was no significant mediastinal or hilar adenopathy.  There were no  significant bony findings.  She underwent a PET scan on March 06, 2009,  showing persistent mass-like consolidation of the right lower lobe with  malignant range activity on the superolateral aspect of interspace  consolidation in the superior segment of the right lower lobe.  SUV was  4.8.  Remainder of consolidation __________ SUV of 4.2.  There are no  hypermetabolic hilar or mediastinal lymph nodes.  There is also a pelvic  mass noted, measuring 14.5 x 10.1 cm with an area in the left lateral  aspect of maximum SUV of 7.5.  Dr. Laneta Simmers on review of the patient's  scan he discussed with the patient, undergoing VATS and possible  thoracotomy with lobectomy.  He discussed the risks and benefits with  the patient.  The patient acknowledged her understanding and agreed to  proceed.  Surgery was scheduled for Apr 08, 2009.  For further details  of the patient's past medical history  and physical exam, please see  dictated H and P.   The patient was taken to the operating room on Apr 08, 2009, where she  underwent flexible fiberoptic bronchoscopy with right video-assisted  thoracoscopic surgery, right thoracotomy with right lower lobectomy.  Her pathology report came back showing adenocarcinoma with abundant  mucin production T2a N0 Mx.  The patient tolerated this procedure well  and was transferred to the Intensive Care Unit in stable condition.  Postoperatively, the patient was extubated following surgery.  She was  noted to be alert and oriented x4 post extubation.  She was noted to be  hemodynamically stable.  Chest x-rays obtained daily.  The patient had  no air leak noted from chest tube.  Posterior chest tube discontinued on  postop day #1 with remaining chest tube discontinued on postop day #2.  Followup chest  x-rays remained stable with no pneumothorax.  The patient  was encouraged to use her incentive spirometer.  She was able to be  weaned off oxygen, sating greater than 90% on room air.  The patient's  path report came back on Apr 11, 2009, showing adenocarcinoma T2a N0 Mx.  A head CT scan was ordered to rule out mets.  This was done on May 15  showing no acute intracranial abnormalities or evidence of CNS  metastasis.  During the patient's postoperative course, she was up  ambulating well without assistance.  She was tolerating diet well.  No  nausea or vomiting noted.  All incisions were noted to be clean, dry,  and intact.  The patient's vital signs were noted to be stable.  She is  afebrile.  Heart rate and blood pressure stable.  She was in normal  sinus rhythm.   On Apr 13, 2009, the patient was noted to be progressing well.  Her most  recent lab work showed sodium of 139, potassium 3.5, chloride of 90,  bicarbonate 30, BUN of 3, creatinine 0.38, and glucose 103.  White blood  cell count of 11.6, hemoglobin 9.9, hematocrit 29.9, and platelet count  285.  The patient is felt to be stable and ready for discharge home this  afternoon on Apr 13, 2009.   FOLLOWUP APPOINTMENTS:  A follow-up appointment has been arranged with  Dr. Laneta Simmers for April 28, 2009, at 1:15 p.m.  The patient will need to  obtain PA and lateral chest x-ray 30 minutes prior to this appointment.  The patient has an appointment to see a nurse for suture removal on Apr 16, 2009, at 9:30 a.m.   ACTIVITY:  The patient was instructed no driving until released to do  so, no lifting over 10 pounds.  She is told to ambulate 3-4 times per  day, progress as tolerated, and continue her breathing exercises.   INCISIONAL CARE:  The patient is told to shower washing her incisions  using soap and water.  She is to contact the office if she develops any  drainage or opening from any of her incision sites.   DIET:  The patient is  educated on diet to be low-fat, low-salt.   DISCHARGE MEDICATIONS:  1. Multivitamin daily.  2. Oxycodone 5 mg 1-2 tabs q.4-6 h. p.r.n. pain.       Sol Blazing, PA      Evelene Croon, M.D.  Electronically Signed    KMD/MEDQ  D:  04/13/2009  T:  04/13/2009  Job:  [id number]   cc:   Oretha Milch, MD

## 2011-04-12 NOTE — Consult Note (Signed)
NEW PATIENT CONSULTATION   Kathryn, [name]  DOB:  [birth year]                                        March 20, 2009  CHART #:  [id number]   REASON FOR CONSULTATION:  Persistent large right lower lobe  consolidation/lung mass.   CLINICAL HISTORY:  I was asked by Dr. Vassie Loll to evaluate the patient for  consideration of surgical biopsy of the right lower lobe.  She is a 50-  year-old [place], nonsmoking woman, who was initially seen in Urgent  Care at the beginning of November for flu-like symptoms.  Chest x-ray  apparently showed her right lower lobe infiltrate and she was treated  with Tamiflu and Avelox, and was off work for about 2 weeks.  She was  seen again on October 19, 2008, and chest x-ray showed persistent right  lower lobe airspace disease.  CT scan of the chest showed confluent  airspace disease involving the right lower lobe consistent with  pneumonia.  She was treated with 10 days of Augmentin.  She had a  followup CT scan on November 18, 2008, which was unchanged compared to  the previous scan.  She returned at the beginning of January reporting  dyspnea and persistent clear sputum.  She had a chest x-ray that was  unchanged.  She subsequently underwent a flexible fiberoptic  bronchoscopy which was unremarkable showing no endobronchial lesions.  She had brushings, biopsy, and washings of the right lower lobe, all of  which were negative for malignancy or specific diagnosis.  All the  bronchoscopic cultures were negative including AFB.  She underwent a  repeat CT scan of the chest without contrast on February 20, 2009, which  showed persistent right lower lobe airspace opacity with no obvious  obstructing mass or endobronchial lesion.  There were two right lower  lobe pulmonary nodules adjacent to the lung opacity, one of which was 5  mm and one was 11 mm.  The larger nodule was seen on the previous study  where it measured 8 mm.  There was no mediastinal or  hilar adenopathy.  There are no significant bony findings.  The upper abdomen was grossly  normal.  She subsequently underwent a PET scan on March 06, 2009, which  showed the persistent mass-like consolidation of right lower lobe with  malignant-range activity along the superior lateral aspect of the  airspace consolidation in the superior segment of the right lower lobe  with an SUV of 4.8.  The remainder of consolidation had a maximum SUV of  4.2.  There were no hypermetabolic hilar or mediastinal lymph nodes.  There was also a pelvic mass noted measuring 14.5 x 10.1 cm with an area  in the left lateral aspect of mass having a maximum SUV of 7.5.   REVIEW OF SYSTEMS:  Her review of systems is as follows.  General:  She  denies any fever or chills.  She denies any recent weight changes.  She  denies anorexia.  She has had no fatigue.  Eyes:  Negative.  ENT:  Negative.  Endocrine:  She denies diabetes and hypothyroidism.  Cardiovascular:  She denies any chest pain or pressure.  She denies PND  or orthopnea.  She has had no peripheral edema.  Respiratory:  She has  had cough production of some clear sputum.  She denies any  hemoptysis.  She denies exertional dyspnea.  GI:  She has had no nausea or vomiting.  She denies melena and bright red blood per rectum.  GU:  She denies  dysuria and hematuria.  Vascular:  She denies claudication and  phlebitis.  Neurological:  She denies any focal weakness or numbness.  She denies dizziness or syncope.  She has never had TIA or stroke.  Psychiatric:  Negative.  Hematological:  Negative.   ALLERGIES:  None.   MEDICATIONS:  Only Tylenol Arthritis p.r.n. for pain.   PAST MEDICAL HISTORY:  Significant only for this above illness as well  as a history of childbirth x7.   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  She immigrated in [place].  She has 7 children.   FAMILY HISTORY:  Significant only for heart  disease in the mother and  brother.   PHYSICAL EXAMINATION:  VITAL SIGNS:  Her blood pressure is 140/95, pulse  is 80 and regular, respiratory rate is 18 and unlabored, and oxygen  saturation on room air is 98%.  GENERAL:  She is a well-developed Asian woman in no distress.  HEENT:  Normocephalic and atraumatic.  Pupils are equal and reactive to  light and accommodation.  Extraocular muscles are intact.  Her throat is  clear.  NECK:  Normal carotid pulses bilaterally.  There are no bruits.  There  is no adenopathy or thyromegaly.  There is no JVD.  CARDIAC:  Regular rate and rhythm with normal S1 and S2.  There is no  murmur, rub, or gallop.  LUNGS:  Clear.  ABDOMEN:  Active bowel sounds.  Abdomen is soft and nontender.  There  are no palpable masses or organomegaly.  EXTREMITIES:  No peripheral edema.  Pedal pulses are palpable  bilaterally.  SKIN:  Warm and dry.  NEUROLOGIC:  Alert and oriented x3.  Motor and sensory exam is grossly  normal.   Pulmonary function testing shows an FEV-1 of 1.60 which is 64% predicted  and FVC 1.86 which is 60% predicted and a ratio of 106%.  Her diffusion  capacity was corrected to 120% predicted.  PFTs were consistent with  moderate airway obstruction.   IMPRESSION:  The patient has a persistent near complete consolidation of  right lower lobe with two adjacent small pulmonary nodules.  She does  have hypermetabolic uptake in the superior aspect of this consolidating  area, this concerning for malignancy.  If this is pneumonia, I would  have expected it to resolve with antibiotic treatment.  The fact that  this has been here since November 2009 and has not resolved at all and  may be a little worse is very concerning.  There could be some  underlying malignancy or inflammatory process going on.  As mentioned  above, all of the bronchoscopic findings and biopsies were negative for  malignancy.  I think the options at this point are either to  attempt a  CT-guided needle biopsy of the hypermetabolic area in the right lower  lobe or to proceed with right VATS and surgical biopsy and then  resection of the right lower lobe if there is malignancy.  I discussed  the options with her and her friend who is here as an interpreter since  she does not speak much [place].  I discussed the benefits and risks of  both approaches and my recommendation for proceeding with right VATS for  open surgical biopsy.  I told her that if we have a diagnosis of cancer,  then I would plan to proceed with right lower lobectomy.  It is also  possible this is some unusual severe inflammatory process and she may  require right lower lobectomy just to remove this lung.  I would doubt  that this lung is going to recover.  I discussed the benefits and risks  of surgery including but not limited to bleeding, blood transfusion,  infection, injury to lung or major vascular structures in the chest,  persistent air leak, bronchial stump failure with bronchopleural  fistula, and death.  She understands all this and agrees to proceed with  surgery.  She will look at her schedule and call our office to schedule  a date in the next few weeks.  She also has a large pelvic mass with a  focal area of malignant-range activity.  She said that this has been  evaluated by her GYN physician and is felt to be a large fibroid.  It is  not clear to me if her GYN knows that she had a PET scan showing an area  of hypermetabolic uptake.   Evelene Croon, M.D.  Electronically Signed   BB/MEDQ  D:  03/20/2009  T:  03/21/2009  Job:  [id number]   cc:   Oretha Milch, MD

## 2011-04-12 NOTE — Assessment & Plan Note (Signed)
OFFICE VISIT   TIMOTHEA, BODENHEIMER  DOB:  Jun 09, 1961                                        April 28, 2009  CHART #:  [id number]   The patient returns today for followup, status post right thoracotomy  and right lower lobectomy for a T2a, N0, MX adenocarcinoma of the lung.  This was somewhat unusual tumor with abundant mucin production and in  the areas of abundant mucin production, there was scattered, isolated  nests of tumor cells.  The tumor dimension was 4 x 4 x 3 cm in greatest  diameter.  Since discharge, she has been feeling well overall.  She does  have some numbness along the right lateral chest extending to the  midline.  She did have some numbness in her right arm postoperatively,  but this is improving.  She has had no cough or shortness of breath.   PHYSICAL EXAMINATION:  VITAL SIGNS:  Blood pressure 129/90 and pulse 92  and regular.  Respiratory rate is 18 and unlabored.  Oxygen saturation  on room air is 99%.  GENERAL:  She looks well.  CARDIAC:  Regular rate and rhythm with normal heart sounds.  LUNGS:  Clear.  CHEST:  The right thoracotomy incision is healing well.   Followup chest x-ray shows clear lung fields and no pleural effusions.   IMPRESSION:  The patient has a T2a, N0, MX adenocarcinoma of the right  lower lobe that was completely resected.  I reviewed the pathology again  with the patient and her family who are acting as translators.  I will  refer her to Dr. Arbutus Ped of Medical Oncology for consideration of any  benefit of postoperative chemotherapy for this lesion.  I will plan to  see her back in 3 months  with a repeat chest x-ray.  I asked her not to lift anything heavier  than about 10 pounds for 8 weeks postoperatively.   Evelene Croon, M.D.  Electronically Signed   BB/MEDQ  D:  04/28/2009  T:  04/29/2009  Job:  [id number]   cc:   Lajuana Matte, MD

## 2011-04-12 NOTE — Assessment & Plan Note (Signed)
OFFICE VISIT   OKTOBER, GLAZER  DOB:  06-Jan-1961                                        November 03, 2009  CHART #:  [id number]   HISTORY OF PRESENT ILLNESS:  The patient returns today for followup  status post right thoracotomy and right lower lobectomy for T2a N0 Mx  adenocarcinoma of the lung.  I last saw her on August 04, 2009.  She  said she has been feeling fairly well since then but recently had some  upper respiratory symptoms which she feels is a cold.  She is here with  a friend today as her interpreter.  She denies any shortness of breath  or chest pain.  She has returned to work and said that she does not feel  like she needs to use a mask anymore at work.   PHYSICAL EXAMINATION:  Her blood pressure is 138/90, pulse is 73 and  regular, respiratory rate is 18 and unlabored.  Oxygen saturation on  room air is 98%.  She looks well.  Neck exam shows no cervical or  supraclavicular adenopathy.  There is no axillary adenopathy.  Her lungs  are clear.  The right thoracotomy incision is well healed.  There are no  skin lesions.  Cardiac exam shows a regular rate and rhythm with normal  heart sounds.  Abdominal exam shows active bowel sounds.  Her abdomen is  soft and nontender.  There are no palpable masses or organomegaly.   DIAGNOSTIC TESTS:  Followup chest x-ray shows no sign of recurrent or  metastatic cancer.  There is an area of hyperdensity just superior to  the right hilum which is unchanged from previous chest x-rays and is  most likely postoperative in nature.   IMPRESSION:  The patient is doing well overall following her lung cancer  surgery.  She has no evidence of recurrence or metastatic disease.  We  will see her back in 3 months with a repeat  chest x-ray and then plan on doing a CT scan at the 1-year mark.  I did  write her a back-to-work note today so that she does not need to use a  mask at work.   Evelene Croon, M.D.  Electronically  Signed   BB/MEDQ  D:  11/03/2009  T:  11/04/2009  Job:  [id number]

## 2011-04-12 NOTE — Assessment & Plan Note (Signed)
OFFICE VISIT   [name], [name]  DOB:  Oct 26, 1961                                        February 02, 2010  CHART #:  [id number]   HISTORY:  The patient returns to my office today for followup status  post right thoracotomy and right lower lobectomy on Apr 18, 2009, for a  T2a N0 Mx adenocarcinoma of the lung.  I last saw her on November 03, 2009, at which time she was doing well.  She saw Dr. Arbutus Ped on November 17, 2009, and a CT scan of the chest on November 13, 2009, showed only  surgical changes in the right lower chest.  There was some soft tissue  density adjacent to bronchial staple line that was felt to be  postoperative scarring.  There was no evidence of recurrent or  metastatic disease at that time.  There was small amount of right-sided  pleural thickening and loculated fluid adjacent to the surgical site.  The patient was scheduled for a followup CT scan of the chest in 6  months, which would be done in June 2011 and she has a followup  appointment with Dr. Arbutus Ped at that time.  Since I last saw her, she  said she has been feeling well overall.  Her only complaint is of some  thickening of the right thoracotomy scar and said that it itches at  times.  She occasionally has some fleeting pains in that area.  She  denies any cough or sputum production.  She has had no hemoptysis.  She  denies any headaches or visual changes.   PHYSICAL EXAMINATION:  Vital Signs:  Today, her blood pressure 137/90,  pulse is 83 and regular, and respiratory rate is 18 and unlabored.  Oxygen saturation on room air is 98%.  General:  She looks well.  There  is no cervical, supraclavicular, or axillary adenopathy.  Lungs:  Clear.  The right thoracotomy incision is well healed.  There is a thickened  keloid along the incision.  There are no skin lesions.  There is no  tenderness.  Cardiac:  Regular rate and rhythm with normal heart sounds.  Abdomen:  Active bowel sounds.  Abdomen  is soft and nontender.  There  are no palpable masses or organomegaly.   Chest x-ray today shows clear lung fields.  There is no sign of  recurrent cancer.  There is no pleural effusion.   IMPRESSION:  The patient is doing well following resection of her stage  IB adenocarcinoma of the right lung.  She is scheduled for followup CT  scan of the chest and an appointment with Dr. Arbutus Ped in June 2011.  I  told her I think it is fine that she is followed by Dr. Arbutus Ped and does  not need to return to see me in the office for followup.  I would be  happy to see her back again if need arises at Dr. Asa Lente discretion.   Evelene Croon, M.D.  Electronically Signed   BB/MEDQ  D:  02/02/2010  T:  02/03/2010  Job:  [id number]   cc:   Lajuana Matte, MD

## 2011-04-12 NOTE — Op Note (Signed)
[name], [name]                     ACCOUNT NO.:  [id number]   MEDICAL RECORD NO.:  [id number]          PATIENT TYPE:  AMB   LOCATION:  CARD                         FACILITY:  [facility]   PHYSICIAN:  Oretha Milch, MD      DATE OF BIRTH:  27-Feb-1961   DATE OF PROCEDURE:  12/12/2008  DATE OF DISCHARGE:                               OPERATIVE REPORT   PROCEDURE PERFORMED:  Bronchoscopy.   INDICATION FOR THE PROCEDURE:  Persistent right lower lobe airspace  disease for about 12 weeks in this [place] 50 year old nonsmoker.  Consent was obtained from the patient prior to the procedure.  Risks of  procedure including coughing, bleeding, and lung pressure requiring a  chest tube were discussed.  Alternatives including serial followup were  also discussed.   Three milligrams of Versed and 75 mcg of fentanyl were used in divided  doses prior to the procedure.  Bronchoscope was inserted from the right  naris.  Upper airway was normal.  Vocal cords were normal in appearance  and motion.  All his trachea and all segmental and subsegmental bronchi  appeared patent.  Clear mucoid phlegm was aspirated from the right lower  lobe.  No endobronchial lesions were noted.  Brushings and  bronchoalveolar lavage were obtained from the right lower lobe and the  fluoroscopy transbronchial biopsies x4 to 5 were obtained from various  segments of the right lower lobe.  Patient tolerated procedure well with  minimal bleeding.  A chest x-ray will be performed to rule out presence  of pneumothorax.  She was awake right after procedure.      Oretha Milch, MD  Electronically Signed     RVA/MEDQ  D:  12/12/2008  T:  12/12/2008  Job:  [id number]

## 2011-04-12 NOTE — Consult Note (Signed)
[name], [name]                     ACCOUNT NO.:  [id number]   MEDICAL RECORD NO.:  [id number]          PATIENT TYPE:  OUT   LOCATION:  XRAY                         [facility]ACILITY:  [facility]   PHYSICIAN:  Paola A. Duard Brady, MD    DATE OF BIRTH:  [birth year]   DATE OF CONSULTATION:  03/17/2009  DATE OF DISCHARGE:                                 CONSULTATION   PHONE CONVERSATION DOCUMENTATION  Ultrasound was performed same day nd revealed the uterus to be 10.8 x  4.8 x 6.5 cm.  There is an 11 x 13 x 11 cm solid mass in close contact  with the right side of the uterus.  The internal appearance of this mass  strongly suggests a fibroid.  The left ovary was normal.  The right  ovary was not visualized.  It is felt that this most likely represents a  pedunculated fibroid.  They were not able to identify the right adnexa.  They suggest that they could follow up with MRI for confirmation.  This  is very consistent with her ultrasound from May 06, 2009 that revealed a  10.7 x 7.5 x 9.2 cm fibroid.  The right ovary was not visualized due to  the fibroid.  There were no adnexal masses seen.  I spoke today with the  patient's daughter at home number [phone] regarding these findings.  I believe that she does have a pedunculated myoma that has been steadily  slightly growing in size since 2008.  I have spoken with Dr. Vassie Loll.  I  think the priority for this patient is for her to undergo her VATS  procedure for a complete diagnostic workup of the pulmonary issue.  They  had discussed with me that they have been told that the fibroids would  get smaller during menopause.  While that may very well be the case, the  patient is only 50 years old and menopause may be many years away and  the mass may get larger in that period of time.  However, I believe I  would recommend starting with the VATS procedure for further workup and  clarification of the lung, and then follow up with Dr. Marcelle Overlie for  evaluation and  potential management strategies for this pedunculated  fibroid.  The patient's daughters seem to understand and they will call  to schedule an appointment with Dr. Marcelle Overlie, and will follow up as  scheduled with thoracic surgery for her VATS.      Paola A. Duard Brady, MD  Electronically Signed     PAG/MEDQ  D:  03/17/2009  T:  03/17/2009  Job:  [id number]   cc:   Telford Nab, R.N.  [address]  [place], [place] [address]   Oretha Milch, MD  [address] [place] [address]   [facility] Salvia. Marcelle Overlie, M.D.  Fax: [phone]

## 2011-04-12 NOTE — Op Note (Signed)
[name], [name]                     ACCOUNT NO.:  [id number]   MEDICAL RECORD NO.:  [id number]          PATIENT TYPE:  INP   LOCATION:  2304                         FACILITY:  [facility]   PHYSICIAN:  Evelene Croon, M.D.     DATE OF BIRTH:  Jul 25, 1961   DATE OF PROCEDURE:  04/08/2009  DATE OF DISCHARGE:                               OPERATIVE REPORT   PREOPERATIVE DIAGNOSIS:  Large right lower lobe mass.   POSTOPERATIVE DIAGNOSIS:  Large right lower lobe mass.   PROCEDURES:  1. Flexible fiberoptic bronchoscopy.  2. Right video-assisted thoracoscopy.  3. Right thoracotomy and right lower lobectomy.   SURGEON:  Evelene Croon, MD   ASSISTANT:  Stephanie Acre Dasovich, PA-C   ANESTHESIA:  General endotracheal.   CLINICAL HISTORY:  This patient is a 50 year old [place] nonsmoker who  was initially seen in urgent care at the beginning of November 2009 for  flu-like symptoms.  Chest x-ray apparently showed a right lower lobe  infiltrate and she was treated with Avelox and Tamiflu for about 2  weeks.  She was seen again October 19, 2008, and the chest x-ray showed  persistent right lower lobe airspace disease.  CT scan of the chest  showed confluent airspace disease involving the right lower lobe  consistent with pneumonia.  She was treated with 10 days of Augmentin  and a followup CT scan on November 18, 2008, was unchanged.  She  returned in the beginning of January with dyspnea and persistent clear  sputum production.  A chest x-ray was unchanged showing right lower lobe  consolidation.  She underwent flexible fiberoptic bronchoscopy, which  was unremarkable with no endobronchial lesions.  Brushings, biopsy, and  washes of the right lower lobe were all negative for malignancy or  specific diagnosis.  All the bronchoscopic cultures were negative  including AFB.  She underwent repeat CT scan on February 20, 2009, showing  persistent right lower lobe airspace opacity with no obvious  obstructing  master endobronchial lesions seen.  There were also two right lower lobe  pulmonary nodules adjacent to the lung opacity measuring 5 mm and 11 mm.  There was no significant mediastinal or hilar adenopathy.  There were no  significant bony findings.  Upper abdomen was grossly normal.  She  subsequently underwent a PET scan on March 06, 2009, showing a persistent  mass-like consolidation of the right lower lobe with malignant range  activity along the superolateral aspect of this interspace consolidation  in the superior segment of the right lower lobe.  SUV was 4.8.  The  remainder of the consolidation had a maximus SUV of 4.2.  There were no  hypermetabolic, hilar, and mediastinal lymph nodes.  There was also a  pelvic mass noted measuring 14.5 x 10.1 cm with an area in the left  lateral aspect of the mass having a maximum SUV of 7.5.  After review of  her scans and examination of the patient, I felt the best option would  be to proceed with repeat bronchoscopy followed by right VATS and  possible  thoracotomy for lobectomy.  I felt that most likely this would  require right lower lobectomy for removal of this process, this had been  going on since November 2009.  I felt that most likely right lower lobe  was destroyed.  I discussed the operative procedure with the patient via  a friend who is a Nurse, learning disability.  I discussed the alternatives, benefits,  and risks including but not limited to bleeding, blood transfusion,  infection, injury to major vascular structures, bronchial stump,  nonhealing with fistula and air leak, requiring further surgery,  persistence of a space in the right chest, possibly requiring further  surgery, and respiratory failure.  She understood all of this and agreed  to proceed.   OPERATIVE PROCEDURE:  The patient was taken to the operating room and  placed on table in supine position.  She had been seen in the  preoperative holding area and after  confirming the proper patient,  proper operative site, and proper operation, the right-sided chest was  marked by me.  In the operating room, she was placed under general  endotracheal anesthesia using a single-lumen tube initially.  A Foley  catheter was placed in bladder.  Sequential compression devices were  placed on the legs.  Preoperative intravenous vancomycin and Zinacef  were used.  Then flexible fiberoptic bronchoscopy was performed.  The  distal trachea appeared normal.  The carina was sharp.  The left  bronchial tree had normal segmental anatomy and there was some  friability of the mucosa with some oozing of blood just with rubbing the  scope over the mucosa.  There are no endobronchial lesions or  secretions.  The right bronchial tree had normal segmental anatomy.  I  could see the opening of the right lower lobe segmental bronchi, but  could not pass the bronchoscope down that far due to relatively small  size of her bronchi.  There were no endobronchial lesions seen and no  secretions.  The bronchoscope was withdrawn from the patient.  Then, the  single-lumen tube was converted to a double-lumen tube.  The patient was  then positioned in the left lateral decubitus position with a right side  up.  The right-sided chest was prepped with Betadine soap and solution  and draped usual sterile manner.  A time-out was taken and proper  patient, proper operative site, and proper operation were confirmed with  nursing anesthesia staff.  I reviewed the CT scans again in the room.  Then, a small incision was made in the midaxillary line over  approximately the sixth intercostal space and carried down through the  subcutaneous tissue using electrocautery.  The right lung was deflated.  Then, a 10-mm trocar was inserted into the pleural space.  The 30-degree  thoracoscopic was inserted.  Examination of the chest showed that there  appeared be a near-complete consolidation of the right  lower lobe.  It  could not be collapsed at all.  There was scarring and discoloration of  the visceral pleural surface.  There were several areas where the lung  looked umbilicated.  The middle and upper lobes appeared to be free of  this disease process and looked normal.  The major fissure was almost  complete.  There were no lesions seen on the parietal pleural surface  and no effusion.  I felt that this would require right lower lobectomy  for complete removal of the lesion.  Therefore, the thoracoscope was  removed and plans were made to proceed  with right posterolateral  thoracotomy.  Then, a right posterolateral thoracotomy incision was made  and carried down through the muscle using electrocautery.  The pleural  space was entered through the sixth intercostal space after harvesting  an intercostal muscle pedicle flap comprising the muscle between the  fifth and sixth ribs with the periosteum overlying both of these ribs.  The pedicle was ligated and divided anteriorly and mobilized posteriorly  where it was left intact.  The chest retractor was placed.  The major  fissure was opened.  There was a fair amount of edema and inflammation  throughout this area.  I was able to locate the right lower lobe  pulmonary arterial branches.  There was one arterial branch of the  superior segment and then the trunk supplying the basilar branches.  These were encircled and divided using a vascular stapler.  The middle  lobe arterial branches were preserved.  Then, the inferior pulmonary  ligament was divided up to the inferior pulmonary vein.  There was a  single large pulmonary vein to the lower lobe.  The middle lobe  pulmonary vein was coming off, was joining the right superior pulmonary  vein and this was identified and preserved.  The lower lobe pulmonary  vein was divided using a vascular stapler.  Then, the remainder of the  major fissure was divided using linear staplers.  The right  middle lobe  bronchus was identified and carefully preserved.  The lower lobe  bronchus was encircled and then occluded using a TA-30 linear stapler.  Before firing the stapler, the right upper and middle lobes were  inflated and these inflated easily and completely.  They were then  deflated and the stapler fired.  The right lower lobe bronchus was  divided distal to the stapler.  The specimen was passed off the table  and was sent to pathology for permanent section.  The stapler was  removed.  The bronchial stump appeared intact.  There was one large  paraesophageal lymph node and this was removed and sent to pathology as  a separate specimen.  Then, the staple lines across the lung and the raw  lung within the area of the fissure were coated with CoSeal to seal any  air leaks.  The intercostal muscle pedicle was brought down over the  bronchial stump and sutured in the place with interrupted silk sutures  around the area.  Then, the On-Q pain catheter was inserted through a  small stab incision in the posterior axillary line.  An introducer and  tunneler were used to create a subpleural tunnel posterior to the  thoracotomy incision and two interspaces above.  The introducer was  removed and a catheter placed through the sheath.  The peel-away sheath  was removed.  The catheter was fixed to the skin with a silk suture and  was flushed with 5 mL of 0.5% Marcaine without difficulty.  Then, two 28-  French chest tubes were placed through separate stab incisions with one  positioned posteriorly and one anteriorly.  The ribs were reapproximated  with #2 Vicryl pericostal sutures.  Before tying the sutures, the right  upper and middle lobes were reinflated.  The right middle lobe was  firmly adherent to the right upper lobe and did not require any pexing.  Then, the pericostal sutures were tied.  The muscles were closed in  layers using continuous #1 Vicryl suture.  Subcutaneous tissue was   closed with continuous 2-0 Vicryl and the  skin with a 3-0 Vicryl  subcuticular closure.  The sponge, needle, and instrument counts were  correct according to the scrub nurse.  The patient was then turned in  the supine position, extubated, and transported to the post anesthesia  care unit in satisfactory and stable condition.      Evelene Croon, M.D.  Electronically Signed     BB/MEDQ  D:  04/08/2009  T:  04/09/2009  Job:  [id number]   cc:   Oretha Milch, MD

## 2011-04-14 ENCOUNTER — Encounter (INDEPENDENT_AMBULATORY_CARE_PROVIDER_SITE_OTHER): Payer: 59 | Admitting: Internal Medicine

## 2011-04-14 DIAGNOSIS — C3490 Malignant neoplasm of unspecified part of unspecified bronchus or lung: Secondary | ICD-10-CM

## 2011-04-15 ENCOUNTER — Other Ambulatory Visit: Payer: Self-pay | Admitting: Internal Medicine

## 2011-04-15 DIAGNOSIS — Z85118 Personal history of other malignant neoplasm of bronchus and lung: Secondary | ICD-10-CM

## 2011-04-21 ENCOUNTER — Ambulatory Visit
Admission: RE | Admit: 2011-04-21 | Discharge: 2011-04-21 | Disposition: A | Discharge status: Home / Self Care | Payer: 59 | Source: Ambulatory Visit | Attending: Radiation Oncology | Admitting: Radiation Oncology

## 2011-04-21 DIAGNOSIS — R51 Headache: Secondary | ICD-10-CM | POA: Insufficient documentation

## 2011-04-21 DIAGNOSIS — C3490 Malignant neoplasm of unspecified part of unspecified bronchus or lung: Secondary | ICD-10-CM | POA: Insufficient documentation

## 2011-04-21 DIAGNOSIS — Z510 Encounter for antineoplastic radiation therapy: Secondary | ICD-10-CM | POA: Insufficient documentation

## 2011-04-21 DIAGNOSIS — R110 Nausea: Secondary | ICD-10-CM | POA: Insufficient documentation

## 2011-04-21 DIAGNOSIS — K208 Other esophagitis without bleeding: Secondary | ICD-10-CM | POA: Insufficient documentation

## 2011-04-21 DIAGNOSIS — Y842 Radiological procedure and radiotherapy as the cause of abnormal reaction of the patient, or of later complication, without mention of misadventure at the time of the procedure: Secondary | ICD-10-CM | POA: Insufficient documentation

## 2011-05-20 ENCOUNTER — Other Ambulatory Visit: Payer: Self-pay | Admitting: Internal Medicine

## 2011-05-20 ENCOUNTER — Encounter (HOSPITAL_BASED_OUTPATIENT_CLINIC_OR_DEPARTMENT_OTHER): Payer: 59 | Admitting: Internal Medicine

## 2011-05-20 ENCOUNTER — Ambulatory Visit (HOSPITAL_COMMUNITY)
Admission: RE | Admit: 2011-05-20 | Discharge: 2011-05-20 | Disposition: A | Discharge status: Home / Self Care | Payer: 59 | Source: Ambulatory Visit | Attending: Internal Medicine | Admitting: Internal Medicine

## 2011-05-20 DIAGNOSIS — Z85118 Personal history of other malignant neoplasm of bronchus and lung: Secondary | ICD-10-CM

## 2011-05-20 DIAGNOSIS — I517 Cardiomegaly: Secondary | ICD-10-CM | POA: Insufficient documentation

## 2011-05-20 DIAGNOSIS — C3430 Malignant neoplasm of lower lobe, unspecified bronchus or lung: Secondary | ICD-10-CM

## 2011-05-20 LAB — CMP (CANCER CENTER ONLY)
ALT(SGPT): 36 U/L (ref 10–47)
AST: 38 U/L (ref 11–38)
Albumin: 3.5 g/dL (ref 3.3–5.5)
Alkaline Phosphatase: 62 U/L (ref 26–84)
BUN, Bld: 10 mg/dL (ref 7–22)
CO2: 28 mEq/L (ref 18–33)
Calcium: 8.6 mg/dL (ref 8.0–10.3)
Chloride: 97 mEq/L — ABNORMAL LOW (ref 98–108)
Creat: 0.5 mg/dl — ABNORMAL LOW (ref 0.6–1.2)
Glucose, Bld: 113 mg/dL (ref 73–118)
Potassium: 4.7 mEq/L (ref 3.3–4.7)
Sodium: 138 mEq/L (ref 128–145)
Total Bilirubin: 0.4 mg/dl (ref 0.20–1.60)
Total Protein: 7.9 g/dL (ref 6.4–8.1)

## 2011-05-20 LAB — CBC WITH DIFFERENTIAL/PLATELET
BASO%: 0.3 % (ref 0.0–2.0)
Basophils Absolute: 0 10*3/uL (ref 0.0–0.1)
EOS%: 0.4 % (ref 0.0–7.0)
Eosinophils Absolute: 0 10*3/uL (ref 0.0–0.5)
HCT: 38.9 % (ref 34.8–46.6)
HGB: 12.6 g/dL (ref 11.6–15.9)
LYMPH%: 14.3 % (ref 14.0–49.7)
MCH: 23.3 pg — ABNORMAL LOW (ref 25.1–34.0)
MCHC: 32.3 g/dL (ref 31.5–36.0)
MCV: 72 fL — ABNORMAL LOW (ref 79.5–101.0)
MONO#: 0.6 10*3/uL (ref 0.1–0.9)
MONO%: 9.3 % (ref 0.0–14.0)
NEUT#: 4.7 10*3/uL (ref 1.5–6.5)
NEUT%: 75.7 % (ref 38.4–76.8)
Platelets: 287 10*3/uL (ref 145–400)
RBC: 5.41 10*6/uL (ref 3.70–5.45)
RDW: 17.7 % — ABNORMAL HIGH (ref 11.2–14.5)
WBC: 6.2 10*3/uL (ref 3.9–10.3)
lymph#: 0.9 10*3/uL (ref 0.9–3.3)

## 2011-05-20 MED ORDER — IOHEXOL 300 MG/ML  SOLN
80.0000 mL | Freq: Once | INTRAMUSCULAR | Status: AC | PRN
  Administered 2011-05-20: 80 mL via INTRAVENOUS

## 2011-05-25 ENCOUNTER — Encounter: Payer: 59 | Admitting: Internal Medicine

## 2011-06-17 ENCOUNTER — Other Ambulatory Visit: Payer: Self-pay | Admitting: Family Medicine

## 2011-06-17 DIAGNOSIS — Z1231 Encounter for screening mammogram for malignant neoplasm of breast: Secondary | ICD-10-CM

## 2011-07-08 ENCOUNTER — Ambulatory Visit
Admission: RE | Admit: 2011-07-08 | Discharge: 2011-07-08 | Disposition: A | Discharge status: Home / Self Care | Payer: 59 | Source: Ambulatory Visit | Attending: Radiation Oncology | Admitting: Radiation Oncology

## 2011-07-26 ENCOUNTER — Ambulatory Visit: Payer: 59

## 2011-07-26 ENCOUNTER — Ambulatory Visit
Admission: RE | Admit: 2011-07-26 | Discharge: 2011-07-26 | Disposition: A | Discharge status: Home / Self Care | Payer: 59 | Source: Ambulatory Visit | Attending: Family Medicine | Admitting: Family Medicine

## 2011-07-26 DIAGNOSIS — Z1231 Encounter for screening mammogram for malignant neoplasm of breast: Secondary | ICD-10-CM

## 2011-07-28 ENCOUNTER — Other Ambulatory Visit: Payer: Self-pay | Admitting: Family Medicine

## 2011-07-28 DIAGNOSIS — R928 Other abnormal and inconclusive findings on diagnostic imaging of breast: Secondary | ICD-10-CM

## 2011-08-08 ENCOUNTER — Ambulatory Visit
Admission: RE | Admit: 2011-08-08 | Discharge: 2011-08-08 | Disposition: A | Discharge status: Home / Self Care | Payer: 59 | Source: Ambulatory Visit | Attending: Family Medicine | Admitting: Family Medicine

## 2011-08-08 DIAGNOSIS — R928 Other abnormal and inconclusive findings on diagnostic imaging of breast: Secondary | ICD-10-CM

## 2011-08-10 ENCOUNTER — Other Ambulatory Visit (HOSPITAL_COMMUNITY): Payer: 59

## 2011-09-02 ENCOUNTER — Other Ambulatory Visit: Payer: Self-pay | Admitting: Internal Medicine

## 2011-09-02 ENCOUNTER — Ambulatory Visit (HOSPITAL_COMMUNITY)
Admission: RE | Admit: 2011-09-02 | Discharge: 2011-09-02 | Disposition: A | Discharge status: Home / Self Care | Payer: 59 | Source: Ambulatory Visit | Attending: Internal Medicine | Admitting: Internal Medicine

## 2011-09-02 ENCOUNTER — Encounter (HOSPITAL_COMMUNITY): Payer: Self-pay

## 2011-09-02 ENCOUNTER — Encounter (HOSPITAL_BASED_OUTPATIENT_CLINIC_OR_DEPARTMENT_OTHER): Payer: 59 | Admitting: Internal Medicine

## 2011-09-02 DIAGNOSIS — C3490 Malignant neoplasm of unspecified part of unspecified bronchus or lung: Secondary | ICD-10-CM | POA: Insufficient documentation

## 2011-09-02 DIAGNOSIS — Z85118 Personal history of other malignant neoplasm of bronchus and lung: Secondary | ICD-10-CM

## 2011-09-02 DIAGNOSIS — J90 Pleural effusion, not elsewhere classified: Secondary | ICD-10-CM | POA: Insufficient documentation

## 2011-09-02 LAB — CMP (CANCER CENTER ONLY)
ALT(SGPT): 40 U/L (ref 10–47)
AST: 37 U/L (ref 11–38)
Albumin: 3.7 g/dL (ref 3.3–5.5)
Alkaline Phosphatase: 107 U/L — ABNORMAL HIGH (ref 26–84)
BUN, Bld: 12 mg/dL (ref 7–22)
CO2: 26 mEq/L (ref 18–33)
Calcium: 8.8 mg/dL (ref 8.0–10.3)
Chloride: 103 mEq/L (ref 98–108)
Creat: 0.5 mg/dl — ABNORMAL LOW (ref 0.6–1.2)
Glucose, Bld: 90 mg/dL (ref 73–118)
Potassium: 4.5 mEq/L (ref 3.3–4.7)
Sodium: 142 mEq/L (ref 128–145)
Total Bilirubin: 0.4 mg/dl (ref 0.20–1.60)
Total Protein: 7.5 g/dL (ref 6.4–8.1)

## 2011-09-02 LAB — CBC WITH DIFFERENTIAL/PLATELET
BASO%: 0.4 % (ref 0.0–2.0)
Basophils Absolute: 0 10*3/uL (ref 0.0–0.1)
EOS%: 0.6 % (ref 0.0–7.0)
Eosinophils Absolute: 0 10*3/uL (ref 0.0–0.5)
HCT: 38.3 % (ref 34.8–46.6)
HGB: 12.3 g/dL (ref 11.6–15.9)
LYMPH%: 18.2 % (ref 14.0–49.7)
MCH: 23.3 pg — ABNORMAL LOW (ref 25.1–34.0)
MCHC: 32.1 g/dL (ref 31.5–36.0)
MCV: 72.7 fL — ABNORMAL LOW (ref 79.5–101.0)
MONO#: 0.5 10*3/uL (ref 0.1–0.9)
MONO%: 8.2 % (ref 0.0–14.0)
NEUT#: 4.7 10*3/uL (ref 1.5–6.5)
NEUT%: 72.6 % (ref 38.4–76.8)
Platelets: 363 10*3/uL (ref 145–400)
RBC: 5.27 10*6/uL (ref 3.70–5.45)
RDW: 17.7 % — ABNORMAL HIGH (ref 11.2–14.5)
WBC: 6.4 10*3/uL (ref 3.9–10.3)
lymph#: 1.2 10*3/uL (ref 0.9–3.3)

## 2011-09-02 MED ORDER — IOHEXOL 300 MG/ML  SOLN: 80.0000 mL | Freq: Once | INTRAMUSCULAR | Status: AC | PRN

## 2011-09-07 ENCOUNTER — Encounter (HOSPITAL_BASED_OUTPATIENT_CLINIC_OR_DEPARTMENT_OTHER): Payer: 59 | Admitting: Internal Medicine

## 2011-09-07 DIAGNOSIS — C3430 Malignant neoplasm of lower lobe, unspecified bronchus or lung: Secondary | ICD-10-CM

## 2011-11-04 ENCOUNTER — Encounter: Payer: Self-pay | Admitting: Radiation Oncology

## 2011-11-11 ENCOUNTER — Ambulatory Visit: Admission: RE | Admit: 2011-11-11 | Payer: 59 | Source: Ambulatory Visit | Admitting: Radiation Oncology

## 2011-11-14 ENCOUNTER — Encounter: Payer: Self-pay | Admitting: Radiation Oncology

## 2011-11-14 NOTE — Progress Notes (Signed)
Patient a no show for Friday, November 11, 2011 follow up appointment. Notified Otis Peak of this finding and requested she send a no show letter.

## 2011-11-23 ENCOUNTER — Telehealth: Payer: Self-pay | Admitting: *Deleted

## 2011-11-23 ENCOUNTER — Ambulatory Visit: Payer: 59

## 2011-11-23 ENCOUNTER — Telehealth: Payer: Self-pay | Admitting: Radiation Oncology

## 2011-11-23 ENCOUNTER — Other Ambulatory Visit: Payer: Self-pay | Admitting: Internal Medicine

## 2011-11-23 DIAGNOSIS — R0602 Shortness of breath: Secondary | ICD-10-CM

## 2011-11-23 DIAGNOSIS — C3490 Malignant neoplasm of unspecified part of unspecified bronchus or lung: Secondary | ICD-10-CM

## 2011-11-23 DIAGNOSIS — R05 Cough: Secondary | ICD-10-CM

## 2011-11-23 DIAGNOSIS — R059 Cough, unspecified: Secondary | ICD-10-CM

## 2011-11-23 NOTE — Telephone Encounter (Signed)
Verbal order received and read back from Dr. Arbutus Ped for pt. To see her PCP or go to Urgent Care.   Noted Dr. Cleta Alberts at [facility] as Primary Care.  Also Scheduler Crystal originally received this call and has scheduled lab/CT scans for 01-05-11 and MD f/u for 01-09-11. Called home number and patient has already gone to Urgent Care with her son.  Appointments given to her daughter.

## 2011-11-23 NOTE — Telephone Encounter (Signed)
1012 Patient's son, Inda Castle Sin, phoned a second time after receiving this writers message. He reports that his mother is not doing well. Ra states,"she is constantly coughing causing her to not breath well." Also, he reports she is weak." He states,"she says she is hot on the inside." Patient completed XRT on 06/08/11. Patient a no show for 11/11/11 follow up. Encouraged Ra Sin to take his mother to urgent care or the emergency room. Follow up appointment with Dr. Mitzi Hansen was set for 01/05/2011. Encouraged Ra Sin to ensure his mother kept this appointment. Will notify Dr. Mitzi Hansen of these findings.

## 2011-11-23 NOTE — Telephone Encounter (Signed)
Received message upon arriving to work to call patient's son, Ra. Returned call but, Ra was unavailable. This writer left her name and number requesting he called back. Awaiting return call.

## 2011-11-23 NOTE — Telephone Encounter (Signed)
Patient's son called reporting Riana is sick and unable to go to work.  She is having a hard time breathing because she is coughing so much.  Throat is sore, she feels hot inside, nose is running and she has a headache.  Cough is dry but at times red tinged   Son has not seen this so unable to tell me if it's blood.  Instructed to go to ER.  Asked if her doctor can see her here.  Will notify providers.  Does not have thermometer to check temperature.

## 2011-11-30 ENCOUNTER — Ambulatory Visit: Payer: 59

## 2011-11-30 DIAGNOSIS — R05 Cough: Secondary | ICD-10-CM

## 2011-11-30 DIAGNOSIS — R059 Cough, unspecified: Secondary | ICD-10-CM

## 2011-12-10 ENCOUNTER — Telehealth: Payer: Self-pay | Admitting: Internal Medicine

## 2011-12-10 NOTE — Telephone Encounter (Signed)
Feb appts already scheduled. Called pt to confirm and s/w son re appts for 2/8 and 2/12.

## 2012-01-06 ENCOUNTER — Ambulatory Visit
Admission: RE | Admit: 2012-01-06 | Discharge: 2012-01-06 | Disposition: A | Discharge status: Home / Self Care | Payer: 59 | Source: Ambulatory Visit | Attending: Radiation Oncology | Admitting: Radiation Oncology

## 2012-01-06 ENCOUNTER — Other Ambulatory Visit (HOSPITAL_BASED_OUTPATIENT_CLINIC_OR_DEPARTMENT_OTHER): Payer: 59 | Admitting: Lab

## 2012-01-06 ENCOUNTER — Encounter: Payer: Self-pay | Admitting: Radiation Oncology

## 2012-01-06 ENCOUNTER — Other Ambulatory Visit (HOSPITAL_COMMUNITY): Payer: 59

## 2012-01-06 ENCOUNTER — Ambulatory Visit (HOSPITAL_COMMUNITY)
Admission: RE | Admit: 2012-01-06 | Discharge: 2012-01-06 | Disposition: A | Discharge status: Home / Self Care | Payer: 59 | Source: Ambulatory Visit | Attending: Internal Medicine | Admitting: Internal Medicine

## 2012-01-06 DIAGNOSIS — K7689 Other specified diseases of liver: Secondary | ICD-10-CM | POA: Insufficient documentation

## 2012-01-06 DIAGNOSIS — Z923 Personal history of irradiation: Secondary | ICD-10-CM | POA: Insufficient documentation

## 2012-01-06 DIAGNOSIS — J984 Other disorders of lung: Secondary | ICD-10-CM | POA: Insufficient documentation

## 2012-01-06 DIAGNOSIS — C3490 Malignant neoplasm of unspecified part of unspecified bronchus or lung: Secondary | ICD-10-CM

## 2012-01-06 DIAGNOSIS — C3430 Malignant neoplasm of lower lobe, unspecified bronchus or lung: Secondary | ICD-10-CM

## 2012-01-06 DIAGNOSIS — Z9221 Personal history of antineoplastic chemotherapy: Secondary | ICD-10-CM | POA: Insufficient documentation

## 2012-01-06 DIAGNOSIS — J90 Pleural effusion, not elsewhere classified: Secondary | ICD-10-CM | POA: Insufficient documentation

## 2012-01-06 DIAGNOSIS — Z902 Acquired absence of lung [part of]: Secondary | ICD-10-CM | POA: Insufficient documentation

## 2012-01-06 HISTORY — DX: Personal history of irradiation: Z92.3

## 2012-01-06 LAB — CMP (CANCER CENTER ONLY)
ALT(SGPT): 24 U/L (ref 10–47)
AST: 27 U/L (ref 11–38)
Albumin: 3.6 g/dL (ref 3.3–5.5)
Alkaline Phosphatase: 64 U/L (ref 26–84)
BUN, Bld: 10 mg/dL (ref 7–22)
CO2: 28 mEq/L (ref 18–33)
Calcium: 8.9 mg/dL (ref 8.0–10.3)
Chloride: 98 mEq/L (ref 98–108)
Creat: 0.6 mg/dl (ref 0.6–1.2)
Glucose, Bld: 100 mg/dL (ref 73–118)
Potassium: 4.6 mEq/L (ref 3.3–4.7)
Sodium: 141 mEq/L (ref 128–145)
Total Bilirubin: 0.6 mg/dl (ref 0.20–1.60)
Total Protein: 7.4 g/dL (ref 6.4–8.1)

## 2012-01-06 LAB — CBC WITH DIFFERENTIAL/PLATELET
BASO%: 0.1 % (ref 0.0–2.0)
Basophils Absolute: 0 10*3/uL (ref 0.0–0.1)
EOS%: 0.3 % (ref 0.0–7.0)
Eosinophils Absolute: 0 10*3/uL (ref 0.0–0.5)
HCT: 40.3 % (ref 34.8–46.6)
HGB: 13.2 g/dL (ref 11.6–15.9)
LYMPH%: 16.3 % (ref 14.0–49.7)
MCH: 24.3 pg — ABNORMAL LOW (ref 25.1–34.0)
MCHC: 32.6 g/dL (ref 31.5–36.0)
MCV: 74.3 fL — ABNORMAL LOW (ref 79.5–101.0)
MONO#: 0.6 10*3/uL (ref 0.1–0.9)
MONO%: 7.3 % (ref 0.0–14.0)
NEUT#: 6.6 10*3/uL — ABNORMAL HIGH (ref 1.5–6.5)
NEUT%: 76 % (ref 38.4–76.8)
Platelets: 341 10*3/uL (ref 145–400)
RBC: 5.43 10*6/uL (ref 3.70–5.45)
RDW: 17.9 % — ABNORMAL HIGH (ref 11.2–14.5)
WBC: 8.7 10*3/uL (ref 3.9–10.3)
lymph#: 1.4 10*3/uL (ref 0.9–3.3)

## 2012-01-06 MED ORDER — IOHEXOL 300 MG/ML  SOLN
80.0000 mL | Freq: Once | INTRAMUSCULAR | Status: AC | PRN
  Administered 2012-01-06: 80 mL via INTRAVENOUS

## 2012-01-06 NOTE — Progress Notes (Signed)
Pt's son states she coughs a lot, mostly at night and early morning, nonproductive. Pt taking OTC med for cough, unknown name. She was seen in Urgent Care 1 - 1 1/2 mos ago for pneumonia, completed meds, returned to Urgent care and was told pneumonia was gone. Pt cont to have cough which is worrisome for her and her son. Denies sore throat, SOB, fatigue, loss of appetite.  Pt had CT chest this morning.

## 2012-01-08 NOTE — Progress Notes (Signed)
CC:   Lajuana Matte, M.D. Evelene Croon, M.D.  DIAGNOSIS:  Recurrent non-small-cell lung cancer.  INTERVAL HISTORY:  Ms. Feeser returns to clinic today for followup.  She completed a course of radiation for recurrent disease on 06/08/2011. The patient is concerned today because of an ongoing cough and she is accompanied by her son.  She states that she was diagnosed with pneumonia approximately 1-1/2 months ago, but her cough has continued and is worse at night.  She has taken an unknown liquid cough medicine with moderate success.  She denies any fever, chest pain or difficulty swallowing at this time.  Ms. Golding underwent a repeat CT scan of the chest on 01/06/2012.  This shows an increased focal density in the right upper lobe along the lateral margin of the wedge resection line measuring 2.1 x 1.3 cm.  This was felt to be concerning for recurrent malignancy although conceivably this could also be inflammatory in nature.  A small right pleural effusion with postoperative and post radiation changes on the right were present.  PHYSICAL EXAMINATION:  Vital signs:  Weight 133 pounds.  Blood pressure 117/78, pulse 100, temperature 98.4, O2 saturation 97%.  General:  Well- developed female in no acute distress, alert and oriented times 3. HEENT:  Normocephalic, atraumatic.  Oral cavity clear.  Neck:  Supple without any lymphadenopathy.  Cardiovascular:  Regular rate and rhythm. Respiratory:  Clear to auscultation.  GI:  Abdomen is soft, nontender, normal bowel sounds.  Extremities:  No edema present.  IMPRESSION AND PLAN:  Ms. Rieser returns today for followup, and did undergo a recent CT scan which showed a concerning area for additional recurrence within the right upper lung.  The patient has had an ongoing cough and I have given her a prescription for Tessalon Perles to help with this.  I am going to have the patient proceed with a PET scan to re- stage her systemically and to help  clarify what is going on in the lung. I will have the patient return to clinic after this, and we may also need to have the patient's case presented once again at thoracic conference after her PET scan has been completed.  I spent 20 minutes with the patient today and her son the majority of which was spent counseling them on this diagnosis and implications and coordinating her care.    ______________________________ Radene Gunning, M.D., Ph.D. JSM/MEDQ  D:  01/08/2012  T:  01/08/2012  Job:  [id number]

## 2012-01-09 ENCOUNTER — Telehealth: Payer: Self-pay | Admitting: *Deleted

## 2012-01-10 ENCOUNTER — Telehealth: Payer: Self-pay | Admitting: Internal Medicine

## 2012-01-10 ENCOUNTER — Ambulatory Visit (HOSPITAL_BASED_OUTPATIENT_CLINIC_OR_DEPARTMENT_OTHER): Payer: 59 | Admitting: Internal Medicine

## 2012-01-10 VITALS — BP 135/88 | HR 100 | Temp 100.2°F | Ht 61.0 in | Wt 133.8 lb

## 2012-01-10 DIAGNOSIS — C3490 Malignant neoplasm of unspecified part of unspecified bronchus or lung: Secondary | ICD-10-CM

## 2012-01-10 NOTE — Telephone Encounter (Signed)
gv pt appt schedule for may including ct. Pt/relative declined Nurse, learning disability and says a family member will be w/pt.

## 2012-01-10 NOTE — Progress Notes (Signed)
[facility] OFFICE PROGRESS NOTE  PRINCIPAL DIAGNOSIS:  Local recurrence of non-small cell lung cancer, adenocarcinoma, initially diagnosed as stage IB (T2a N0 M0) in March of 2010.  PRIOR THERAPY:   1. Status post right lower lobectomy under the care of Dr. Laneta Simmers on 04/08/2009. 2. Status post palliative radiotherapy to the right lower lobe recurrent lung mass under the care of Dr. Mitzi Hansen, completed on June 08, 2011.  CURRENT THERAPY:  Observation.  INTERVAL HISTORY: [name] 51 y.o. female returns to the clinic today for 4 month followup visit accompanied by a friend. The patient continues to have dry cough that was not responding to the treatment with Hycodan and Tessalon. She denied having any significant shortness of breath, no chest pain or hemoptysis. She has no significant weight loss. She has repeat CT scan of the chest performed recently and she is here today for evaluation and discussion of her scan results.  MEDICAL HISTORY: Past Medical History  Diagnosis Date  . Lung cancer     right lower lobe adenocarcinoma  . History of radiation therapy 05/02/11 to 06/08/11    right lung    ALLERGIES:   has no known allergies.  MEDICATIONS:  Current Outpatient Prescriptions  Medication Sig Dispense Refill  . acetaminophen (TYLENOL) 325 MG tablet Take 650 mg by mouth every 6 (six) hours as needed.        . Benzonatate (TESSALON PO) Take by mouth as needed.      Marland Kitchen ibuprofen (ADVIL,MOTRIN) 100 MG tablet Take 200 mg by mouth every 6 (six) hours as needed.        . Multiple Vitamin (MULTIVITAMIN) tablet Take 1 tablet by mouth daily.          SURGICAL HISTORY:  Past Surgical History  Procedure Date  . Lung lobectomy 04/18/2009    RLL    REVIEW OF SYSTEMS:  A comprehensive review of systems was negative except for: Respiratory: positive for cough   PHYSICAL EXAMINATION: General appearance: alert, cooperative and no distress Neck: no adenopathy Lymph nodes:  Cervical, supraclavicular, and axillary nodes normal. Resp: clear to auscultation bilaterally Cardio: regular rate and rhythm, S1, S2 normal, no murmur, click, rub or gallop GI: soft, non-tender; bowel sounds normal; no masses,  no organomegaly Extremities: extremities normal, atraumatic, no cyanosis or edema Neurologic: Alert and oriented X 3, normal strength and tone. Normal symmetric reflexes. Normal coordination and gait  ECOG PERFORMANCE STATUS: 1 - Symptomatic but completely ambulatory  Blood pressure 135/88, pulse 100, temperature 100.2 F (37.9 C), temperature source Oral, height 5\' 1"  (1.549 m), weight 133 lb 12.8 oz (60.691 kg).  LABORATORY DATA: Lab Results  Component Value Date   WBC 8.7 01/06/2012   HGB 13.2 01/06/2012   HCT 40.3 01/06/2012   MCV 74.3* 01/06/2012   PLT 341 01/06/2012      Chemistry      Component Value Date/Time   NA 141 01/06/2012 0804   NA 137 03/09/2011 1352   K 4.6 01/06/2012 0804   K 4.6 03/09/2011 1352   CL 98 01/06/2012 0804   CL 103 03/09/2011 1352   CO2 28 01/06/2012 0804   CO2 28 03/09/2011 1352   BUN 10 01/06/2012 0804   BUN 8 03/09/2011 1352   CREATININE 0.6 01/06/2012 0804   CREATININE 0.49 03/09/2011 1352      Component Value Date/Time   CALCIUM 8.9 01/06/2012 0804   CALCIUM 9.7 03/09/2011 1352  ALKPHOS 64 01/06/2012 0804   ALKPHOS 61 03/09/2011 1352   AST 27 01/06/2012 0804   AST 32 03/09/2011 1352   ALT 28 03/09/2011 1352   BILITOT 0.60 01/06/2012 0804   BILITOT 0.4 03/09/2011 1352       RADIOGRAPHIC STUDIES: Ct Chest W Contrast  01/06/2012  *RADIOLOGY REPORT*  Clinical Data: Lung cancer.  Completed chemotherapy and radiation therapy. Prior right lower lobectomy.  CT CHEST WITH CONTRAST  Technique:  Multidetector CT imaging of the chest was performed following the standard protocol during bolus administration of intravenous contrast.  Contrast: 80mL OMNIPAQUE IOHEXOL 300 MG/ML IV SOLN  Comparison: 09/02/2011  Findings: Diffuse hepatic steatosis noted.  A  small right pleural effusion is present with the postoperative findings of prior lobectomy, and with right infrahilar consolidation compatible with radiation port.  On image 16 of series 5, there is increasing focal nodular density in the right upper lobe just lateral to the wedge resection line, currently measuring 2.1 x 1.3 cm.  There is previously only ill- defined interstitial accentuation in this vicinity.  Although this lesion could be inflammatory, I cannot exclude malignancy.  Left lung remains clear.  IMPRESSION:  1.  Increased focal density in the right upper lobe along the lateral margin of the wedge resection line, measuring 2.1 x 1.3 cm on axial images.  Although this could conceivably be inflammatory, the appearance is concerning for recurrent malignancy. 2.  Diffuse hepatic steatosis. 3.  Small right pleural effusion with postoperative and postradiation therapy findings on the right.  Original Report Authenticated By: Dellia Cloud, M.D.    ASSESSMENT: This is a very pleasant 51 years old Asian female with history of recurrent non-small cell lung cancer. She was most recently treated with a course of palliative radiotherapy to the right lower lobe lung mass. CT scan of the chest performed recently showed questionable evidence for disease recurrence. The patient was seen by Dr. Mitzi Hansen who ordered a PET scan and this is scheduled to be performed in 01/16/2012.  PLAN:  #1 If the PET scan showed evidence for disease recurrence I would see the patient sooner for discussion of treatment options including repeat palliative radiation versus systemic chemotherapy. #2 If the PET scan negative for disease recurrence I would see the patient back for followup visit in 3 months with repeat CT scan of the chest with contrast. #3 for the persistent cough, this is most likely secondary to radiation induced pneumonitis I started the patient on the Medrol Dosepak as a trial to see if she would respond to  steroids. The patient was advised to call me immediately if she has any concerning symptoms in the interval   All questions were answered. The patient knows to call the clinic with any problems, questions or concerns. We can certainly see the patient much sooner if necessary.

## 2012-01-16 ENCOUNTER — Encounter (HOSPITAL_COMMUNITY): Payer: Self-pay

## 2012-01-16 ENCOUNTER — Encounter (HOSPITAL_COMMUNITY)
Admission: RE | Admit: 2012-01-16 | Discharge: 2012-01-16 | Disposition: A | Discharge status: Home / Self Care | Payer: 59 | Source: Ambulatory Visit | Attending: Radiation Oncology | Admitting: Radiation Oncology

## 2012-01-16 DIAGNOSIS — C3490 Malignant neoplasm of unspecified part of unspecified bronchus or lung: Secondary | ICD-10-CM | POA: Insufficient documentation

## 2012-01-16 DIAGNOSIS — R911 Solitary pulmonary nodule: Secondary | ICD-10-CM | POA: Insufficient documentation

## 2012-01-16 DIAGNOSIS — J90 Pleural effusion, not elsewhere classified: Secondary | ICD-10-CM | POA: Insufficient documentation

## 2012-01-16 LAB — GLUCOSE, CAPILLARY: Glucose-Capillary: 109 mg/dL — ABNORMAL HIGH (ref 70–99)

## 2012-01-16 MED ORDER — FLUDEOXYGLUCOSE F - 18 (FDG) INJECTION
19.0000 | Freq: Once | INTRAVENOUS | Status: AC | PRN
  Administered 2012-01-16: 19 via INTRAVENOUS

## 2012-01-20 ENCOUNTER — Encounter: Payer: Self-pay | Admitting: Radiation Oncology

## 2012-01-20 ENCOUNTER — Ambulatory Visit
Admission: RE | Admit: 2012-01-20 | Discharge: 2012-01-20 | Disposition: A | Discharge status: Home / Self Care | Payer: 59 | Source: Ambulatory Visit | Attending: Radiation Oncology | Admitting: Radiation Oncology

## 2012-01-20 VITALS — BP 125/91 | HR 95 | Temp 97.0°F | Resp 18 | Wt 131.1 lb

## 2012-01-20 DIAGNOSIS — C3430 Malignant neoplasm of lower lobe, unspecified bronchus or lung: Secondary | ICD-10-CM

## 2012-01-20 MED ORDER — GUAIFENESIN-CODEINE 100-10 MG/5ML PO SYRP: 5.0000 mL | ORAL_SOLUTION | Freq: Three times a day (TID) | ORAL | Status: AC | PRN

## 2012-01-20 NOTE — Progress Notes (Signed)
Patient presented to the clinic today accompanied by a family member for a follow up appointment with Dr. Mitzi Hansen to review results of 01/16/2012 PET scan. Patient is alert and oriented to person, place, and time. No distress noted. Steady gait noted. Pleasant affect noted. Patient denies pain at this time. Patient reports occasional episodes of shortness of breath with exertion. Patient reports a persistent dry cough. Patient reports Tessalon prescribed by Dr. Mitzi Hansen and methylprednisolone prescribed by Dr. Gwenyth Bouillon helps very little in relieving this cough. Reported all findings to Dr. Mitzi Hansen.

## 2012-01-20 NOTE — Telephone Encounter (Signed)
xxx

## 2012-01-24 DIAGNOSIS — C3430 Malignant neoplasm of lower lobe, unspecified bronchus or lung: Secondary | ICD-10-CM | POA: Insufficient documentation

## 2012-01-24 NOTE — Progress Notes (Signed)
CC:   Lajuana Matte, M.D. Evelene Croon, M.D.  DIAGNOSIS:  Recurrent non-small-cell lung cancer.  INTERVAL HISTORY:  [name] returns to clinic today for follow up for review of her recent PET scan.  She had findings on recent imaging concerning for recurrent disease, and we decided to proceed with a PET scan for further restaging.  This was completed on 01/16/2012.  There was a 1.4 x 2.1-cm nodule within the right upper lobe adjacent to the suture line with a maximum SUV of 10.4.  This is compatible with recurrent disease.  There is some adjacent mild nodularity.  There is also some mild hypermetabolism adjacent to the suture line in the right lower lung, which is nonspecific and may reflect radiation change it was felt.  This, therefore, is consistent with recurrent disease.  The patient notes no new symptoms at this time.  PHYSICAL EXAM:  Vital signs:  Weight 131 pounds, blood pressure 125/91, pulse 95, temperature 97, and O2 saturation 99%.  IMPRESSION AND PLAN:  [name] does continue to appear to have recurrent disease, based on her PET scan.  No other metastatic disease was seen. I would like to review her case at the upcoming Thoracic Conference, and I discussed this with the patient.  She wants to come back a day later for follow up to review the consensus from this conference.  I therefore will see her in approximately 1 week.  I spent 10 minutes reviewing this information with [name] today. The majority of which was spent counseling her on her diagnosis of lung cancer and coordinating her care.    ______________________________ Radene Gunning, M.D., Ph.D. JSM/MEDQ  D:  01/24/2012  T:  01/24/2012  Job:  [id number]

## 2012-01-27 ENCOUNTER — Encounter: Payer: Self-pay | Admitting: Radiation Oncology

## 2012-01-27 ENCOUNTER — Ambulatory Visit
Admission: RE | Admit: 2012-01-27 | Discharge: 2012-01-27 | Disposition: A | Discharge status: Home / Self Care | Payer: 59 | Source: Ambulatory Visit | Attending: Radiation Oncology | Admitting: Radiation Oncology

## 2012-01-27 VITALS — BP 117/80 | HR 103 | Temp 97.4°F | Resp 20 | Wt 132.7 lb

## 2012-01-27 DIAGNOSIS — C3430 Malignant neoplasm of lower lobe, unspecified bronchus or lung: Secondary | ICD-10-CM

## 2012-01-27 NOTE — Progress Notes (Signed)
Pt here w/husband, daughter, sister. Daughter and sister speak [place]. Pt has dry cough, SOB w/exertion. Appetite, energy level "normal". Denies pain. Meds for cough give fair relief.

## 2012-01-30 ENCOUNTER — Telehealth: Payer: Self-pay | Admitting: *Deleted

## 2012-01-30 NOTE — Telephone Encounter (Signed)
XXXX 

## 2012-01-31 NOTE — Progress Notes (Signed)
CC:   Lajuana Matte, M.D. Evelene Croon, M.D.  DIAGNOSIS:  Recurrent non-small cell lung cancer.  NARRATIVE:  Ms. Scheiderer returns to clinic today to review the discussion at Thoracic Conference.  The patient's recent imaging including a PET scan did demonstrate recurrent disease within the chest.  She does not have evidence of distant disease outside the chest.  The patient has received both surgery in terms of a right lower lobectomy as well as subsequent radiation treatment for recurrent disease.  She was treated with a hypofractionated course to try to maximize local control, but she has developed recurrent disease nearby superiorly to the prior area. Options we discussed at conference I believe are consideration of possible additional surgery although this would require it seems a quite aggressive surgery, likely pneumonectomy on the right.  Additional alternatives include chemotherapy either with or without surgery.  I have discussed therefore my recommendation that the patient see Dr. Laneta Simmers to have a discussion regarding his opinion and recommendation in regards to any possible surgery, and I would also like the patient to see Dr. Arbutus Ped as well regarding chemotherapy.  All of the patient's questions were answered.  This has proven to be a locally aggressive tumor although there still remains no evidence of distant disease and therefore at least the possibility of aggressive local management was once again discussed in conference.  I will help arrange these upcoming visits and I will put in for the patient to return to clinic to see me in 2 months so I can review what has been done.  I spent 15 minutes with the patient today, the majority of which was spent counseling her on her diagnosis of lung cancer and coordinating her care.    ______________________________ Radene Gunning, M.D., Ph.D. JSM/MEDQ  D:  01/31/2012  T:  01/31/2012  Job:  [id number]

## 2012-02-14 ENCOUNTER — Ambulatory Visit (INDEPENDENT_AMBULATORY_CARE_PROVIDER_SITE_OTHER): Payer: 59 | Admitting: Surgery

## 2012-02-14 ENCOUNTER — Encounter: Payer: Self-pay | Admitting: Surgery

## 2012-02-14 VITALS — BP 152/95 | HR 96 | Resp 16 | Ht 60.0 in | Wt 132.0 lb

## 2012-02-14 DIAGNOSIS — C3490 Malignant neoplasm of unspecified part of unspecified bronchus or lung: Secondary | ICD-10-CM

## 2012-02-15 ENCOUNTER — Other Ambulatory Visit: Payer: Self-pay | Admitting: Surgery

## 2012-02-15 ENCOUNTER — Encounter: Payer: Self-pay | Admitting: Surgery

## 2012-02-15 DIAGNOSIS — C3490 Malignant neoplasm of unspecified part of unspecified bronchus or lung: Secondary | ICD-10-CM

## 2012-02-15 NOTE — Progress Notes (Signed)
[address]            [place] [address]          [phone]      HPI:  The patient is a 51 year old Asian woman who does not understand or speak English and is accompanied today by her friend who speaks [place], as well as a Engineer, technical sales. She is well-known to me status post right lower lobectomy on 04/08/2009 for stage IB (T2a N0) adenocarcinoma. She had a fairly bulky tumor that essentially infiltrated most of the right lower lobe. She subsequently developed recurrence in the remaining portion of the right lung and underwent palliative radiotherapy by Dr. Mitzi Hansen that was completed on 06/08/2011. Her only complaint at this time is of a dry, nonproductive cough that has not responded to treatment with Hycodan, Tessalon, or steroids. She denies any shortness of breath, chest pain, or hemoptysis. She has been eating well and denies any weight loss. She had a repeat CT scan the chest recently that showed increased focal density in the right upper lung zone along the lateral margin of the previous resection line. This measured about 2.1 x 1.3 cm. There is a small right pleural effusion with post-radiation findings on the right side. She subsequently underwent a PET scan that showed mild hypermetabolism adjacent to the staple line in the right lung at the site of her prior recurrence with a maximum SUV of 3.2. It was felt this was nonspecific and may reflect radiation changes. There is also a 1.4 x 2.1 cm nodule in the right upper lung zone adjacent to the staple line with a maximum SUV of 10.4 that was compatible with recurrent disease.  Current Outpatient Prescriptions  Medication Sig Dispense Refill  . acetaminophen (TYLENOL) 325 MG tablet Take 650 mg by mouth every 6 (six) hours as needed.        Marland Kitchen ibuprofen (ADVIL,MOTRIN) 100 MG tablet Take 200 mg by mouth every 6 (six) hours as needed.        . Multiple Vitamin (MULTIVITAMIN) tablet Take 1 tablet by mouth  daily.        . chlorpheniramine-HYDROcodone (TUSSIONEX) 10-8 MG/5ML LQCR       . ipratropium (ATROVENT) 0.03 % nasal spray          Physical Exam:  BP 152/95  Pulse 96  Resp 16  Ht 5' (1.524 m)  Wt 132 lb (59.875 kg)  BMI 25.78 kg/m2  SpO2 97% She looks anxious. Neck exam shows no cervical or supraclavicular adenopathy. Lung exam reveals a few rales on the right. There is a well-healed right thoracotomy scar. There are no skin lesions. Cardiac exam shows a regular rate and rhythm with normal heart sounds. Abdominal exam shows active bowel sounds. Abdomen is soft and mildly obese and nontender. There are no palpable masses or organomegaly. Extremity exam shows no peripheral edema.  Diagnostic Tests:  Clinical Data: Subsequent treatment strategy for lung cancer status post surgery and XRT.   NUCLEAR MEDICINE PET CT RESTAGING (PS) SKULL BASE TO THIGH   Technique:  19.0 mCi F-18 FDG was injected intravenously via the right hand.  Full-ring PET imaging was performed from the skull base through the mid-thighs 61  minutes after injection.  CT data was obtained and used for attenuation correction and anatomic localization only.  (This was not acquired as a diagnostic CT examination.)  Fasting Blood Glucose:  109   Patient Weight:  130 pounds.   Comparison: CT chest dated 01/06/2012.  PET CT dated 03/28/2011.   Findings: Postsurgical changes in the right lung from prior wedge resection.  Right paramediastinal radiation changes.   Focal opacity in the right upper lung medial to the suture line, measuring approximately 1.4 x 2.1 cm (series 2/image 57), with max SUV 10.4.  Adjacent mild nodularity (series 2/image 54).  This is compatible with recurrence.   Mild hypermetabolism adjacent to the suture line in the right lower lung (PET image 68), at the site of prior recurrence, with max SUV 3.2.  This appearance is nonspecific and may reflect radiation changes.   Small  right pleural effusion.  No pneumothorax.   No hypermetabolic foci in the neck.  No suspicious cervical lymphadenopathy.   No suspicious hypermetabolic foci in the abdomen/pelvis. No suspicious abdominopelvic lymphadenopathy.  Uterine fibroids.  The displaced endometrium is mildly FDG-avid (PET 162), although this is likely physiologic in a premenopausal patient.   No focal osseous lesions.   IMPRESSION: 1.4 x 2.1 cm nodule in the right upper lung adjacent to the suture line, max SUV 10.4, compatible with recurrent disease.  Adjacent mild nodularity.   Mild hypermetabolism adjacent to the suture line in the right lower lung, at the site of prior recurrence, max SUV 3.2.  This appearance is nonspecific and may reflect radiation changes.   Postsurgical/postradiation changes in the right lung.   Original Report Authenticated By: Charline Bills, M.D. CT CHEST WITH CONTRAST   Technique:  Multidetector CT imaging of the chest was performed following the standard protocol during bolus administration of intravenous contrast.   Contrast: 80mL OMNIPAQUE IOHEXOL 300 MG/ML IV SOLN   Comparison: 09/02/2011   Findings: Diffuse hepatic steatosis noted.   A small right pleural effusion is present with the postoperative findings of prior lobectomy, and with right infrahilar consolidation compatible with radiation port.   On image 16 of series 5, there is increasing focal nodular density in the right upper lobe just lateral to the wedge resection line, currently measuring 2.1 x 1.3 cm.  There is previously only ill- defined interstitial accentuation in this vicinity.  Although this lesion could be inflammatory, I cannot exclude malignancy.   Left lung remains clear.   IMPRESSION:   1.  Increased focal density in the right upper lobe along the lateral margin of the wedge resection line, measuring 2.1 x 1.3 cm on axial images.  Although this could conceivably be inflammatory, the  appearance is concerning for recurrent malignancy. 2.  Diffuse hepatic steatosis. 3.  Small right pleural effusion with postoperative and postradiation therapy findings on the right.   Original Report Authenticated By: Dellia Cloud, M.D.   Impression:  She has recurrent adenocarcinoma in the residual right lung status post prior right lower lobectomy in 2010. This has persisted and is still positive on PET scan despite radiation therapy. She has no evidence of distant metastatic disease. She was discussed at the thoracic oncology conference and it was felt that she should be considered for completion pneumonectomy to remove all residual disease in the right lung since she is only 51 years old. Her pulmonary function testing prior to her original surgery showed moderate obstructive and restrictive disease with an FEV1 of 1.6 that was 64% of predicted. We will need to repeat her pulmonary function testing with diffusion capacity and a room air arterial blood gas to decide if she would be a  candidate for completion pneumonectomy. I discussed all of  this with the patient through her friend and the interpreter and answered all her questions.  Plan:  We will schedule pulmonary function testing as soon as possible I will see her back in the office to discuss the results with her and decide whether completion right pneumonectomy would be an option.

## 2012-02-20 ENCOUNTER — Ambulatory Visit (HOSPITAL_COMMUNITY)
Admission: RE | Admit: 2012-02-20 | Discharge: 2012-02-20 | Disposition: A | Discharge status: Home / Self Care | Payer: 59 | Source: Ambulatory Visit | Attending: Surgery | Admitting: Surgery

## 2012-02-20 DIAGNOSIS — R0609 Other forms of dyspnea: Secondary | ICD-10-CM | POA: Insufficient documentation

## 2012-02-20 DIAGNOSIS — R0989 Other specified symptoms and signs involving the circulatory and respiratory systems: Secondary | ICD-10-CM | POA: Insufficient documentation

## 2012-02-20 DIAGNOSIS — Z01818 Encounter for other preprocedural examination: Secondary | ICD-10-CM | POA: Insufficient documentation

## 2012-02-20 DIAGNOSIS — C3490 Malignant neoplasm of unspecified part of unspecified bronchus or lung: Secondary | ICD-10-CM

## 2012-02-20 LAB — BLOOD GAS, ARTERIAL
Acid-Base Excess: 1.2 mmol/L (ref 0.0–2.0)
Bicarbonate: 24.8 mEq/L — ABNORMAL HIGH (ref 20.0–24.0)
Drawn by: 211791
FIO2: 0.21 %
O2 Saturation: 97.9 %
Patient temperature: 98.6
TCO2: 25.9 mmol/L (ref 0–100)
pCO2 arterial: 36.3 mmHg (ref 35.0–45.0)
pH, Arterial: 7.449 — ABNORMAL HIGH (ref 7.350–7.400)
pO2, Arterial: 91.6 mmHg (ref 80.0–100.0)

## 2012-02-20 LAB — PULMONARY FUNCTION TEST

## 2012-02-20 MED ORDER — ALBUTEROL SULFATE (5 MG/ML) 0.5% IN NEBU
2.5000 mg | INHALATION_SOLUTION | Freq: Once | RESPIRATORY_TRACT | Status: AC
  Administered 2012-02-20: 2.5 mg via RESPIRATORY_TRACT

## 2012-02-21 ENCOUNTER — Encounter: Payer: Self-pay | Admitting: Surgery

## 2012-02-21 ENCOUNTER — Ambulatory Visit (INDEPENDENT_AMBULATORY_CARE_PROVIDER_SITE_OTHER): Payer: 59 | Admitting: Surgery

## 2012-02-21 ENCOUNTER — Telehealth: Payer: Self-pay | Admitting: Internal Medicine

## 2012-02-21 VITALS — BP 127/88 | HR 104 | Resp 18 | Ht 60.0 in | Wt 130.0 lb

## 2012-02-21 DIAGNOSIS — C3490 Malignant neoplasm of unspecified part of unspecified bronchus or lung: Secondary | ICD-10-CM

## 2012-02-21 DIAGNOSIS — Z7189 Other specified counseling: Secondary | ICD-10-CM

## 2012-02-21 DIAGNOSIS — Z712 Person consulting for explanation of examination or test findings: Secondary | ICD-10-CM

## 2012-02-21 DIAGNOSIS — Z85118 Personal history of other malignant neoplasm of bronchus and lung: Secondary | ICD-10-CM

## 2012-02-21 NOTE — Progress Notes (Signed)
                   [address]            [place] [address]          [phone]      HPI:  The patient returns today to discuss results of her pulmonary function testing and room air arterial blood gas. She is here today with all of her children and husband as well as the interpreter.  Current Outpatient Prescriptions  Medication Sig Dispense Refill  . acetaminophen (TYLENOL) 325 MG tablet Take 650 mg by mouth every 6 (six) hours as needed.        . chlorpheniramine-HYDROcodone (TUSSIONEX) 10-8 MG/5ML LQCR       . ibuprofen (ADVIL,MOTRIN) 100 MG tablet Take 200 mg by mouth every 6 (six) hours as needed.        Marland Kitchen ipratropium (ATROVENT) 0.03 % nasal spray       . Multiple Vitamin (MULTIVITAMIN) tablet Take 1 tablet by mouth daily.           Physical Exam:  BP 127/88  Pulse 104  Resp 18  Ht 5' (1.524 m)  Wt 130 lb (58.968 kg)  BMI 25.39 kg/m2  SpO2 98% She looks anxious. Lung exam reveals a few rhonchi on the right  Diagnostic Tests:  Primary function testing shows an FEV1 of 1.15 which is 47% of predicted. FVC is 1.38 which is 44% of predicted. Diffusion capacity is 83% of predicted. There is no improvement with bronchodilators. Room air arterial blood gas is 7.45, 36, 91, 98%.  Impression:  She has severe obstructive airways disease and severe restriction with normal gas exchange and diffusing capacity that is high for her measured alveolar volume. I think she would be at prohibitive risk for completion pneumonectomy and if she made it through without any major complications she would be very limited from a pulmonary standpoint. I would not recommend completion pneumonectomy for recurrent cancer under these conditions. I've recommended that she discuss chemotherapy options with Dr. Arbutus Ped. I discussed all this with the patient through the interpreter as well as with her husband and children.  Plan: We'll schedule followup appointment with Dr.  Arbutus Ped

## 2012-02-21 NOTE — Telephone Encounter (Signed)
appt had changes with pft and needs a asap appt per bartle    aom

## 2012-02-23 ENCOUNTER — Ambulatory Visit (HOSPITAL_BASED_OUTPATIENT_CLINIC_OR_DEPARTMENT_OTHER): Payer: 59 | Admitting: Internal Medicine

## 2012-02-23 ENCOUNTER — Other Ambulatory Visit: Payer: Self-pay | Admitting: Medical Oncology

## 2012-02-23 ENCOUNTER — Encounter (HOSPITAL_COMMUNITY): Payer: Self-pay | Admitting: Pharmacy Technician

## 2012-02-23 ENCOUNTER — Telehealth: Payer: Self-pay | Admitting: Internal Medicine

## 2012-02-23 VITALS — BP 127/90 | HR 108 | Temp 97.0°F | Ht 60.0 in | Wt 130.1 lb

## 2012-02-23 DIAGNOSIS — C3490 Malignant neoplasm of unspecified part of unspecified bronchus or lung: Secondary | ICD-10-CM

## 2012-02-23 MED ORDER — PROCHLORPERAZINE MALEATE 10 MG PO TABS: 10.0000 mg | ORAL_TABLET | Freq: Four times a day (QID) | ORAL | Status: DC | PRN

## 2012-02-23 MED ORDER — DEXAMETHASONE 4 MG PO TABS: ORAL_TABLET | ORAL | Status: DC

## 2012-02-23 MED ORDER — CYANOCOBALAMIN 1000 MCG/ML IJ SOLN
1000.0000 ug | Freq: Once | INTRAMUSCULAR | Status: AC
  Administered 2012-02-23: 1000 ug via INTRAMUSCULAR

## 2012-02-23 MED ORDER — FOLIC ACID 1 MG PO TABS: 1.0000 mg | ORAL_TABLET | Freq: Every day | ORAL | Status: DC

## 2012-02-23 MED ORDER — LIDOCAINE-PRILOCAINE 2.5-2.5 % EX CREA: TOPICAL_CREAM | CUTANEOUS | Status: DC

## 2012-02-23 NOTE — Telephone Encounter (Signed)
gv pt appt schedule for April thru June. cx'd may lb/ct f/u w/MM per Diane. New orders given today - pt will start tx again 4/4 and see MM 4/11.

## 2012-02-23 NOTE — Progress Notes (Signed)
[facility] Telephone:([phone]) [phone]   Fax:([phone]) [phone]  OFFICE PROGRESS NOTE  PRINCIPAL DIAGNOSIS: Local recurrence of non-small cell lung cancer, adenocarcinoma, initially diagnosed as stage IB (T2a N0 M0) in March of 2010.   PRIOR THERAPY:  1. Status post right lower lobectomy under the care of Dr. Laneta Simmers on 04/08/2009. 2. Status post palliative radiotherapy to the right lower lobe recurrent lung mass under the care of Dr. Mitzi Hansen, completed on June 08, 2011.  CURRENT THERAPY: Observation.   INTERVAL HISTORY: [name] 51 y.o. female returns to the clinic today for followup visit accompanied by several family members. The patient was seen recently by Dr. Mitzi Hansen as well as Dr. Laneta Simmers. They recommended for her no further radiotherapy or surgical options. She was referred back to me for evaluation and discussion of systemic chemotherapy. The patient is feeling fine with no specific complaints. She has no chest pain or shortness of breath, no cough or hemoptysis. She has no weight loss or night sweats.   MEDICAL HISTORY: Past Medical History  Diagnosis Date  . Lung cancer     right lower lobe adenocarcinoma  . History of radiation therapy 05/02/11 to 06/08/11    right lung    ALLERGIES:   has no known allergies.  MEDICATIONS:  Current Outpatient Prescriptions  Medication Sig Dispense Refill  . chlorpheniramine-HYDROcodone (TUSSIONEX) 10-8 MG/5ML LQCR       . ibuprofen (ADVIL,MOTRIN) 100 MG tablet Take 200 mg by mouth every 6 (six) hours as needed.        Marland Kitchen ipratropium (ATROVENT) 0.03 % nasal spray       . Multiple Vitamin (MULTIVITAMIN) tablet Take 1 tablet by mouth daily.        Marland Kitchen acetaminophen (TYLENOL) 325 MG tablet Take 650 mg by mouth every 6 (six) hours as needed.         Current Facility-Administered Medications  Medication Dose Route Frequency Provider Last Rate Last Dose  . cyanocobalamin ((VITAMIN B-12)) injection 1,000 mcg  1,000 mcg Intramuscular Once  Si Gaul, MD   1,000 mcg at 02/23/12 1252    SURGICAL HISTORY:  Past Surgical History  Procedure Date  . Lung lobectomy 04/18/2009    RLL    REVIEW OF SYSTEMS:  A comprehensive review of systems was negative.   PHYSICAL EXAMINATION: General appearance: alert, cooperative and no distress Head: Normocephalic, without obvious abnormality, atraumatic Neck: no adenopathy Lymph nodes: Cervical, supraclavicular, and axillary nodes normal. Resp: clear to auscultation bilaterally Cardio: regular rate and rhythm, S1, S2 normal, no murmur, click, rub or gallop GI: soft, non-tender; bowel sounds normal; no masses,  no organomegaly Extremities: extremities normal, atraumatic, no cyanosis or edema Neurologic: Alert and oriented X 3, normal strength and tone. Normal symmetric reflexes. Normal coordination and gait  ECOG PERFORMANCE STATUS: 0 - Asymptomatic  Blood pressure 127/90, pulse 108, temperature 97 F (36.1 C), temperature source Oral, height 5' (1.524 m), weight 130 lb 1.6 oz (59.013 kg).  LABORATORY DATA: Lab Results  Component Value Date   WBC 8.7 01/06/2012   HGB 13.2 01/06/2012   HCT 40.3 01/06/2012   MCV 74.3* 01/06/2012   PLT 341 01/06/2012      Chemistry      Component Value Date/Time   NA 141 01/06/2012 0804   NA 137 03/09/2011 1352   K 4.6 01/06/2012 0804   K 4.6 03/09/2011 1352   CL 98 01/06/2012 0804   CL 103 03/09/2011 1352   CO2  28 01/06/2012 0804   CO2 28 03/09/2011 1352   BUN 10 01/06/2012 0804   BUN 8 03/09/2011 1352   CREATININE 0.6 01/06/2012 0804   CREATININE 0.49 03/09/2011 1352      Component Value Date/Time   CALCIUM 8.9 01/06/2012 0804   CALCIUM 9.7 03/09/2011 1352   ALKPHOS 64 01/06/2012 0804   ALKPHOS 61 03/09/2011 1352   AST 27 01/06/2012 0804   AST 32 03/09/2011 1352   ALT 28 03/09/2011 1352   BILITOT 0.60 01/06/2012 0804   BILITOT 0.4 03/09/2011 1352       RADIOGRAPHIC STUDIES: No results found.  ASSESSMENT: This is a very pleasant 51 years old Asian female  with recurrent non-small cell lung cancer, adenocarcinoma was negative for EGFR mutation and negative ALK gene translocation. The patient is not a candidate for further radiotherapy or surgical resection at this point.  PLAN: I have a lengthy discussion with the patient and her family about her current disease status, prognosis and treatment options. I recommended for the addition treatment with systemic chemotherapy in the form of carboplatin for AUC of 5 and Alimta 500 mg/M2 every 3 weeks. I discussed with the patient adverse effect of this treatment including but not limited to alopecia, myelosuppression, nausea and vomiting, peripheral neuropathy, liver or renal dysfunction. The patient would have to chemotherapy education class early next week.  Patient was given prescription today for Compazine 10 mg by mouth every 6 hours as needed for nausea, Decadron 4 mg by mouth twice a day, the day before, the day of and the day after the chemotherapy as well as folic acid 1 mg by mouth daily. She will also receive vitamin B12 injection today.  I expect the patient to start the first cycle of this treatment next week. She would come back for followup visit in 2 weeks for evaluation and management any adverse effect of her chemotherapy.  All questions were answered. The patient knows to call the clinic with any problems, questions or concerns. We can certainly see the patient much sooner if necessary.  I spent 20 minutes counseling the patient face to face. The total time spent in the appointment was 35 minutes.

## 2012-02-23 NOTE — Telephone Encounter (Signed)
Message sent to lawanna to schedule [place] interpreter for appts @ [facility].

## 2012-02-24 ENCOUNTER — Telehealth: Payer: Self-pay | Admitting: *Deleted

## 2012-02-24 ENCOUNTER — Telehealth: Payer: Self-pay | Admitting: Internal Medicine

## 2012-02-24 NOTE — Telephone Encounter (Signed)
Pt/relative given appt schedule for April thru June prior to leaving yesterday. Previous appt for lb/ct/fu in may cx'd per diane. lawanna in clinical social work contacted re [place] interpreter.

## 2012-02-24 NOTE — Telephone Encounter (Signed)
Spoke with pt sister regarding appt for port plcacement April 2.  I explained Short Stay at [facility] will call with instructions.  She verbalized understanding.

## 2012-02-27 ENCOUNTER — Other Ambulatory Visit (HOSPITAL_COMMUNITY): Payer: Self-pay | Admitting: *Deleted

## 2012-02-27 ENCOUNTER — Other Ambulatory Visit: Payer: Self-pay

## 2012-02-27 DIAGNOSIS — C3490 Malignant neoplasm of unspecified part of unspecified bronchus or lung: Secondary | ICD-10-CM

## 2012-02-27 MED ORDER — DEXTROSE 5 % IV SOLN
1.5000 g | INTRAVENOUS | Status: DC
  Filled 2012-02-27: qty 1.5

## 2012-02-27 NOTE — Progress Notes (Signed)
I called patient's  Home number at [phone], p'ts son RA Sin said that patient was not home yet and that patient will not be having surgery tomorrow. "There is a conflict and she has to go to a chemo thearpy class tomorrow."  I asked if pt could reschedule the class and RA said "no".  " The doctor said that she did no have to have the porta cath now, she can have chemo therapy through veins on arms a time or two."  Ra said that pt could come in for lab draw tomorrow, but not surgery.  I explained that she will need an appointment for labs and that it would not be made until she has another surgery date.   I called Dr. Scheryl Darter answering service and he is not on call.  Operator said to call back at 0700.  I left information for Welch.

## 2012-02-28 ENCOUNTER — Other Ambulatory Visit (HOSPITAL_COMMUNITY): Payer: Self-pay | Admitting: *Deleted

## 2012-02-28 ENCOUNTER — Encounter (HOSPITAL_COMMUNITY): Payer: Self-pay | Admitting: *Deleted

## 2012-02-28 ENCOUNTER — Ambulatory Visit (HOSPITAL_COMMUNITY): Admission: RE | Admit: 2012-02-28 | Payer: 59 | Source: Ambulatory Visit | Admitting: Thoracic Surgery

## 2012-02-28 ENCOUNTER — Encounter (HOSPITAL_COMMUNITY): Admission: RE | Payer: Self-pay | Source: Ambulatory Visit

## 2012-02-28 ENCOUNTER — Other Ambulatory Visit: Payer: 59

## 2012-02-28 ENCOUNTER — Encounter: Payer: Self-pay | Admitting: *Deleted

## 2012-02-28 SURGERY — INSERTION, TUNNELED CENTRAL VENOUS DEVICE, WITH PORT
Anesthesia: Monitor Anesthesia Care | Site: Chest | Laterality: Right

## 2012-02-28 NOTE — H&P (View-Only) (Signed)
                   [address]            [place] [address]          [phone]      HPI:  The patient returns today to discuss results of her pulmonary function testing and room air arterial blood gas. She is here today with all of her children and husband as well as the interpreter.  Current Outpatient Prescriptions  Medication Sig Dispense Refill  . acetaminophen (TYLENOL) 325 MG tablet Take 650 mg by mouth every 6 (six) hours as needed.        . chlorpheniramine-HYDROcodone (TUSSIONEX) 10-8 MG/5ML LQCR       . ibuprofen (ADVIL,MOTRIN) 100 MG tablet Take 200 mg by mouth every 6 (six) hours as needed.        . ipratropium (ATROVENT) 0.03 % nasal spray       . Multiple Vitamin (MULTIVITAMIN) tablet Take 1 tablet by mouth daily.           Physical Exam:  BP 127/88  Pulse 104  Resp 18  Ht 5' (1.524 m)  Wt 130 lb (58.968 kg)  BMI 25.39 kg/m2  SpO2 98% She looks anxious. Lung exam reveals a few rhonchi on the right  Diagnostic Tests:  Primary function testing shows an FEV1 of 1.15 which is 47% of predicted. FVC is 1.38 which is 44% of predicted. Diffusion capacity is 83% of predicted. There is no improvement with bronchodilators. Room air arterial blood gas is 7.45, 36, 91, 98%.  Impression:  She has severe obstructive airways disease and severe restriction with normal gas exchange and diffusing capacity that is high for her measured alveolar volume. I think she would be at prohibitive risk for completion pneumonectomy and if she made it through without any major complications she would be very limited from a pulmonary standpoint. I would not recommend completion pneumonectomy for recurrent cancer under these conditions. I've recommended that she discuss chemotherapy options with Dr. [name]. I discussed all this with the patient through the interpreter as well as with her husband and children.  Plan: We'll schedule followup appointment with Dr.  [name]      

## 2012-02-28 NOTE — Interval H&P Note (Signed)
History and Physical Interval Note:  02/28/2012 7:14 AM  [name]  has presented today for surgery, with the diagnosis of LUNG CANCER  The various methods of treatment have been discussed with the patient and family. After consideration of risks, benefits and other options for treatment, the patient has consented to  Procedure(s) (LRB): INSERTION PORT-A-CATH (Right) as a surgical intervention .  The patients' history has been reviewed, patient examined, no change in status, stable for surgery.  I have reviewed the patients' chart and labs.  Questions were answered to the patient's satisfaction.     Cameron Proud

## 2012-02-28 NOTE — Progress Notes (Signed)
Through Telephonic Interpreting, I was able to speak to Mrs. Baysinger and gather some history  And psch- social information and give patient instructions for her surgery , scheduled for 4/3.  Pt to arrive at 8:30.  Pt through interpreter [phone], Lelon Mast said that she has a conflict with surgery on 4/3.  "I have a tooth ache and I have a dentist appt at 10:30".  "I have have tooth removed before I start chemo and I start Chemo on Thursday. "  I shared with her that she and her family can call Dr Arbutus Ped and see what she needs to do about surgery, dentist and chemo after she arrives to [facility]. Pt said that she would be at [facility] at 8:30 am.

## 2012-02-29 ENCOUNTER — Ambulatory Visit (HOSPITAL_COMMUNITY): Payer: 59

## 2012-02-29 ENCOUNTER — Encounter (HOSPITAL_COMMUNITY): Payer: Self-pay

## 2012-02-29 ENCOUNTER — Encounter: Payer: Self-pay | Admitting: Internal Medicine

## 2012-02-29 ENCOUNTER — Encounter (HOSPITAL_COMMUNITY)
Admission: RE | Disposition: A | Discharge status: Home / Self Care | Payer: Self-pay | Source: Ambulatory Visit | Attending: Thoracic Surgery

## 2012-02-29 ENCOUNTER — Ambulatory Visit (HOSPITAL_COMMUNITY)
Admission: RE | Admit: 2012-02-29 | Discharge: 2012-02-29 | Disposition: A | Discharge status: Home / Self Care | Payer: 59 | Source: Ambulatory Visit | Attending: Thoracic Surgery | Admitting: Thoracic Surgery

## 2012-02-29 ENCOUNTER — Encounter (HOSPITAL_COMMUNITY): Payer: Self-pay | Admitting: *Deleted

## 2012-02-29 ENCOUNTER — Telehealth: Payer: Self-pay | Admitting: Internal Medicine

## 2012-02-29 DIAGNOSIS — C3490 Malignant neoplasm of unspecified part of unspecified bronchus or lung: Secondary | ICD-10-CM | POA: Insufficient documentation

## 2012-02-29 HISTORY — PX: PORTACATH PLACEMENT: SHX2246

## 2012-02-29 LAB — CBC
HCT: 41.6 % (ref 36.0–46.0)
Hemoglobin: 13.5 g/dL (ref 12.0–15.0)
MCH: 24.6 pg — ABNORMAL LOW (ref 26.0–34.0)
MCHC: 32.5 g/dL (ref 30.0–36.0)
MCV: 75.8 fL — ABNORMAL LOW (ref 78.0–100.0)
Platelets: 294 10*3/uL (ref 150–400)
RBC: 5.49 MIL/uL — ABNORMAL HIGH (ref 3.87–5.11)
RDW: 16.8 % — ABNORMAL HIGH (ref 11.5–15.5)
WBC: 9.7 10*3/uL (ref 4.0–10.5)

## 2012-02-29 LAB — SURGICAL PCR SCREEN
MRSA, PCR: NEGATIVE
Staphylococcus aureus: POSITIVE — AB

## 2012-02-29 LAB — PROTIME-INR
INR: 0.88 (ref 0.00–1.49)
Prothrombin Time: 12.1 seconds (ref 11.6–15.2)

## 2012-02-29 LAB — COMPREHENSIVE METABOLIC PANEL
ALT: 36 U/L — ABNORMAL HIGH (ref 0–35)
AST: 28 U/L (ref 0–37)
Albumin: 3.9 g/dL (ref 3.5–5.2)
Alkaline Phosphatase: 95 U/L (ref 39–117)
BUN: 8 mg/dL (ref 6–23)
CO2: 24 mEq/L (ref 19–32)
Calcium: 9.3 mg/dL (ref 8.4–10.5)
Chloride: 104 mEq/L (ref 96–112)
Creatinine, Ser: 0.45 mg/dL — ABNORMAL LOW (ref 0.50–1.10)
GFR calc Af Amer: >90 mL/min (ref >90)
GFR calc non Af Amer: >90 mL/min (ref >90)
Glucose, Bld: 97 mg/dL (ref 70–99)
Potassium: 4.6 mEq/L (ref 3.5–5.1)
Sodium: 138 mEq/L (ref 135–145)
Total Bilirubin: 0.3 mg/dL (ref 0.3–1.2)
Total Protein: 7 g/dL (ref 6.0–8.3)

## 2012-02-29 LAB — APTT: aPTT: 36 seconds (ref 24–37)

## 2012-02-29 LAB — HCG, SERUM, QUALITATIVE: Preg, Serum: NEGATIVE

## 2012-02-29 SURGERY — INSERTION, TUNNELED CENTRAL VENOUS DEVICE, WITH PORT
Anesthesia: Monitor Anesthesia Care | Site: Chest | Laterality: Left | Wound class: Clean

## 2012-02-29 MED ORDER — MIDAZOLAM HCL 5 MG/5ML IJ SOLN
INTRAMUSCULAR | Status: DC | PRN
  Administered 2012-02-29: .5 mg via INTRAVENOUS

## 2012-02-29 MED ORDER — ONDANSETRON HCL 4 MG/2ML IJ SOLN
INTRAMUSCULAR | Status: DC | PRN
  Administered 2012-02-29: 4 mg via INTRAVENOUS

## 2012-02-29 MED ORDER — LIDOCAINE-EPINEPHRINE (PF) 1 %-1:200000 IJ SOLN
INTRAMUSCULAR | Status: DC | PRN
  Administered 2012-02-29: 30 mL

## 2012-02-29 MED ORDER — LACTATED RINGERS IV SOLN
INTRAVENOUS | Status: DC
  Administered 2012-02-29 (×2): via INTRAVENOUS

## 2012-02-29 MED ORDER — SODIUM CHLORIDE 0.9 % IR SOLN
Status: DC | PRN
  Administered 2012-02-29: 11:00:00

## 2012-02-29 MED ORDER — FENTANYL CITRATE 0.05 MG/ML IJ SOLN
INTRAMUSCULAR | Status: DC | PRN
  Administered 2012-02-29 (×2): 50 ug via INTRAVENOUS

## 2012-02-29 MED ORDER — ONDANSETRON HCL 4 MG/2ML IJ SOLN: 4.0000 mg | Freq: Once | INTRAMUSCULAR | Status: DC | PRN

## 2012-02-29 MED ORDER — HYDROMORPHONE HCL PF 1 MG/ML IJ SOLN: 0.2500 mg | INTRAMUSCULAR | Status: DC | PRN

## 2012-02-29 MED ORDER — PROPOFOL 10 MG/ML IV EMUL
INTRAVENOUS | Status: DC | PRN
  Administered 2012-02-29: 25 ug/kg/min via INTRAVENOUS

## 2012-02-29 MED ORDER — MUPIROCIN 2 % EX OINT
TOPICAL_OINTMENT | CUTANEOUS | Status: AC
  Filled 2012-02-29: qty 22

## 2012-02-29 MED ORDER — PHENYLEPHRINE HCL 10 MG/ML IJ SOLN
INTRAMUSCULAR | Status: DC | PRN
  Administered 2012-02-29: 80 ug via INTRAVENOUS

## 2012-02-29 MED ORDER — 0.9 % SODIUM CHLORIDE (POUR BTL) OPTIME
TOPICAL | Status: DC | PRN
  Administered 2012-02-29: 1000 mL

## 2012-02-29 SURGICAL SUPPLY — 41 items
BAG DECANTER FOR FLEXI CONT (MISCELLANEOUS) ×2 IMPLANT
BLADE SURG 11 STRL SS (BLADE) ×2 IMPLANT
CANISTER SUCTION 2500CC (MISCELLANEOUS) ×2 IMPLANT
CLOTH BEACON ORANGE TIMEOUT ST (SAFETY) ×2 IMPLANT
COVER SURGICAL LIGHT HANDLE (MISCELLANEOUS) ×4 IMPLANT
DERMABOND ADVANCED (GAUZE/BANDAGES/DRESSINGS) ×1
DERMABOND ADVANCED .7 DNX12 (GAUZE/BANDAGES/DRESSINGS) ×1 IMPLANT
DRAPE C-ARM 42X72 X-RAY (DRAPES) ×2 IMPLANT
DRAPE CHEST BREAST 15X10 FENES (DRAPES) ×2 IMPLANT
ELECT REM PT RETURN 9FT ADLT (ELECTROSURGICAL) ×2
ELECTRODE REM PT RTRN 9FT ADLT (ELECTROSURGICAL) ×1 IMPLANT
GLOVE BIO SURGEON STRL SZ7 (GLOVE) ×2 IMPLANT
GLOVE BIOGEL PI IND STRL 7.0 (GLOVE) ×2 IMPLANT
GLOVE BIOGEL PI INDICATOR 7.0 (GLOVE) ×2
GLOVE SURG SIGNA 7.5 PF LTX (GLOVE) ×2 IMPLANT
GOWN BRE IMP PREV XXLGXLNG (GOWN DISPOSABLE) ×2 IMPLANT
GOWN PREVENTION PLUS XLARGE (GOWN DISPOSABLE) ×4 IMPLANT
GOWN STRL NON-REIN LRG LVL3 (GOWN DISPOSABLE) IMPLANT
GUIDEWIRE UNCOATED ST S 7038 (WIRE) IMPLANT
INTRODUCER 13FR (MISCELLANEOUS) IMPLANT
INTRODUCER COOK 11FR (CATHETERS) IMPLANT
KIT BASIN OR (CUSTOM PROCEDURE TRAY) ×2 IMPLANT
KIT PORT POWER 9.6FR MRI PREA (Catheter) ×2 IMPLANT
KIT PORT POWER ISP 8FR (Catheter) IMPLANT
KIT POWER CATH 8FR (Catheter) ×2 IMPLANT
KIT ROOM TURNOVER OR (KITS) ×2 IMPLANT
NEEDLE 22X1 1/2 (OR ONLY) (NEEDLE) ×2 IMPLANT
NS IRRIG 1000ML POUR BTL (IV SOLUTION) ×2 IMPLANT
PACK GENERAL/GYN (CUSTOM PROCEDURE TRAY) ×2 IMPLANT
PAD ARMBOARD 7.5X6 YLW CONV (MISCELLANEOUS) ×4 IMPLANT
SET SHEATH INTRODUCER 10FR (MISCELLANEOUS) IMPLANT
SPONGE GAUZE 4X4 12PLY (GAUZE/BANDAGES/DRESSINGS) ×2 IMPLANT
SUT SILK 2 0 SH (SUTURE) ×4 IMPLANT
SUT VIC AB 3-0 SH 27 (SUTURE) ×1
SUT VIC AB 3-0 SH 27X BRD (SUTURE) ×1 IMPLANT
SYR 20CC LL (SYRINGE) ×2 IMPLANT
SYR CONTROL 10ML LL (SYRINGE) ×2 IMPLANT
TAPE CLOTH SURG 6X10 WHT LF (GAUZE/BANDAGES/DRESSINGS) ×2 IMPLANT
TOWEL OR 17X24 6PK STRL BLUE (TOWEL DISPOSABLE) ×2 IMPLANT
TOWEL OR 17X26 10 PK STRL BLUE (TOWEL DISPOSABLE) ×2 IMPLANT
WATER STERILE IRR 1000ML POUR (IV SOLUTION) IMPLANT

## 2012-02-29 NOTE — Anesthesia Postprocedure Evaluation (Signed)
  Anesthesia Post-op Note  Patient: [name]  Procedure(s) Performed: Procedure(s) (LRB): INSERTION PORT-A-CATH (Left)  Patient Location: PACU  Anesthesia Type: MAC  Level of Consciousness: awake, oriented, sedated and patient cooperative  Airway and Oxygen Therapy: Patient Spontanous Breathing and Patient connected to nasal cannula oxygen  Post-op Pain: mild  Post-op Assessment: Post-op Vital signs reviewed, Patient's Cardiovascular Status Stable, Respiratory Function Stable, Patent Airway, No signs of Nausea or vomiting and Pain level controlled  Post-op Vital Signs: stable  Complications: No apparent anesthesia complications

## 2012-02-29 NOTE — Brief Op Note (Signed)
02/29/2012  11:31 AM  PATIENT:  [name]  51 y.o. female  PRE-OPERATIVE DIAGNOSIS:  Lung cancer  POST-OPERATIVE DIAGNOSIS:  Lung cancer  PROCEDURE:  Procedure(s) (LRB): INSERTION PORT-A-CATH (Left)  SURGEON:  Surgeon(s) and Role:    * Ines Bloomer, MD - Primary  PHYSICIAN ASSISTANT:   ASSISTANTS: none   ANESTHESIA:   local  EBL:  Total I/O In: -  Out: 100 [Blood:100]  BLOOD ADMINISTERED:none  DRAINS: portocath   LOCAL MEDICATIONS USED:  LIDOCAINE   SPECIMEN:  No Specimen  DISPOSITION OF SPECIMEN:  N/A  COUNTS:  YES  TOURNIQUET:  * No tourniquets in log *  DICTATION: .Other Dictation: Dictation Number [id number]  PLAN OF CARE: Discharge to home after PACU  PATIENT DISPOSITION:  PACU - hemodynamically stable.   Delay start of Pharmacological VTE agent (>24hrs) due to surgical blood loss or risk of bleeding: yes

## 2012-02-29 NOTE — Transfer of Care (Signed)
Immediate Anesthesia Transfer of Care Note  Patient: [name]  Procedure(s) Performed: Procedure(s) (LRB): INSERTION PORT-A-CATH (Left)  Patient Location: PACU  Anesthesia Type: MAC  Level of Consciousness: awake, alert  and oriented  Airway & Oxygen Therapy: Patient Spontanous Breathing  Post-op Assessment: Report given to PACU RN, Post -op Vital signs reviewed and stable and Patient moving all extremities X 4  Post vital signs: Reviewed and stable  Complications: No apparent anesthesia complications

## 2012-02-29 NOTE — Op Note (Signed)
NAMELOUVINIA, CUMBO NO.:  [id number]  MEDICAL RECORD NO.:  [id number]  LOCATION:  [facility]                         FACILITY:  [facility]  PHYSICIAN:  Ines Bloomer, M.D. DATE OF BIRTH:  [birth year]  DATE OF PROCEDURE: DATE OF DISCHARGE:  02/29/2012                              OPERATIVE REPORT   PREOPERATIVE DIAGNOSIS:  Stage IIIB non-small cell lung cancer.  POSTOPERATIVE DIAGNOSIS:  Stage IIIB non-small cell lung cancer.  OPERATION PERFORMED:  Insertion of left IJ Port-A-Cath.  SURGEON:  Ines Bloomer, M.D.  ANESTHESIA:  Xylocaine 1% and IV sedation.  DESCRIPTION OF PROCEDURE:  After prep and draping the left chest and right chest, an area was infiltrated with 1% Xylocaine in the left infraclavicular area, and a left subclavian puncture was performed. Through the needle a guidewire, I was attempted to thread with fluoro, but this would not pass appropriately, just coiled.  For this reasons, we abandoned doing the left subclavian and did a left IJ puncture and we were able to pass the guidewire without difficulty to the right atrium. A stab wound was made around the guidewire.  Another area was infiltrated with 1% Xylocaine and inferior to this, a transverse incision was made and dissection was carried down dissecting out the subcutaneous tissue, and the Port-A-Cath was inserted.  A 9.63 pre- attached Port-A-Cath was inserted and sutured in place, and the tubing passed from the pocket up to the stab wound.  Over the guidewire, I was passed a dilator with a peel-away sheath.  The dilator and guidewire were removed, but the tubing would not pass through the peel-away sheath, so we passed another guidewire through the peel-away sheath and then got an unattached branch of Port-A-Cath and passed it over the guidewire and then removed the peel-away sheath and passed it without difficulty to the right atrial SVC junction and then removed the guidewire.  We then  retrograde tunneled the tubing to the pocket and then placed a locking nut on the Port-A-Cath tubing and then attached the Port-A-Cath to Port-A-Cath reservoir and locked it in place.  We sutured the Port-A-Cath reservoir in place with 2-0 silk and closed the wound with 3-0 Vicryl.  It flushed easily and withdrew easily.  The patient was then returned to the recovery room in stable condition.  The chest fluoro showed no evidence of any pneumothorax at that time.  A dry sterile dressing was applied.     Ines Bloomer, M.D.     DPB/MEDQ  D:  02/29/2012  T:  02/29/2012  Job:  [id number]

## 2012-02-29 NOTE — Preoperative (Signed)
Beta Blockers   Reason not to administer Beta Blockers:Not Applicable 

## 2012-02-29 NOTE — Discharge Instructions (Signed)
Instructions Following General Anesthetic, Adult A nurse specialized in giving anesthesia (anesthetist) or a doctor specialized in giving anesthesia (anesthesiologist) gave you a medicine that made you sleep while a procedure was performed. For as long as 24 hours following this procedure, you may feel:  Dizzy.   Weak.   Drowsy.  AFTER THE PROCEDURE After surgery, you will be taken to the recovery area where a nurse will monitor your progress. You will be allowed to go home when you are awake, stable, taking fluids well, and without complications. For the first 24 hours following an anesthetic:  Have a responsible person with you.   Do not drive a car. If you are alone, do not take public transportation.   Do not drink alcohol.   Do not take medicine that has not been prescribed by your caregiver.   Do not sign important papers or make important decisions.   You may resume normal diet and activities as directed.   Change bandages (dressings) as directed.   Only take over-the-counter or prescription medicines for pain, discomfort, or fever as directed by your caregiver.  If you have questions or problems that seem related to the anesthetic, call the hospital and ask for the anesthetist or anesthesiologist on call. SEEK IMMEDIATE MEDICAL CARE IF:   You develop a rash.   You have difficulty breathing.   You have chest pain.   You develop any allergic problems.  Document Released: 02/20/2001 Document Revised: 11/03/2011 Document Reviewed: 10/01/2007 Exit[employer] Patient Information 2012 [employer]. 

## 2012-02-29 NOTE — H&P (View-Only) (Signed)
                   [address]            [place] [address]          [phone]      HPI:  The patient returns today to discuss results of her pulmonary function testing and room air arterial blood gas. She is here today with all of her children and husband as well as the interpreter.  Current Outpatient Prescriptions  Medication Sig Dispense Refill  . acetaminophen (TYLENOL) 325 MG tablet Take 650 mg by mouth every 6 (six) hours as needed.        . chlorpheniramine-HYDROcodone (TUSSIONEX) 10-8 MG/5ML LQCR       . ibuprofen (ADVIL,MOTRIN) 100 MG tablet Take 200 mg by mouth every 6 (six) hours as needed.        Marland Kitchen ipratropium (ATROVENT) 0.03 % nasal spray       . Multiple Vitamin (MULTIVITAMIN) tablet Take 1 tablet by mouth daily.           Physical Exam:  BP 127/88  Pulse 104  Resp 18  Ht 5' (1.524 m)  Wt 130 lb (58.968 kg)  BMI 25.39 kg/m2  SpO2 98% She looks anxious. Lung exam reveals a few rhonchi on the right  Diagnostic Tests:  Primary function testing shows an FEV1 of 1.15 which is 47% of predicted. FVC is 1.38 which is 44% of predicted. Diffusion capacity is 83% of predicted. There is no improvement with bronchodilators. Room air arterial blood gas is 7.45, 36, 91, 98%.  Impression:  She has severe obstructive airways disease and severe restriction with normal gas exchange and diffusing capacity that is high for her measured alveolar volume. I think she would be at prohibitive risk for completion pneumonectomy and if she made it through without any major complications she would be very limited from a pulmonary standpoint. I would not recommend completion pneumonectomy for recurrent cancer under these conditions. I've recommended that she discuss chemotherapy options with Dr. Arbutus Ped. I discussed all this with the patient through the interpreter as well as with her husband and children.  Plan: We'll schedule followup appointment with Dr.  Arbutus Ped

## 2012-02-29 NOTE — Telephone Encounter (Signed)
Pt's son came in today and per son pt needs to have tooth pulled this week and wants to know if tx can be delayed. Per MM all appts delayed by one wk so pt can have tooth pulled wk of 4/4. Son given new schedule for April thru June and pt Lawanna in clinical social work also contacted.

## 2012-02-29 NOTE — Anesthesia Preprocedure Evaluation (Addendum)
Anesthesia Evaluation  Patient identified by MRN, date of birth, ID band Patient awake    Reviewed: Allergy & Precautions, H&P , NPO status , Patient's Chart, lab work & pertinent test results, reviewed documented beta blocker date and time   Airway Mallampati: II      Dental  (+) Dental Advidsory Given and Chipped   Pulmonary          Cardiovascular     Neuro/Psych    GI/Hepatic   Endo/Other    Renal/GU      Musculoskeletal   Abdominal   Peds  Hematology   Anesthesia Other Findings   Reproductive/Obstetrics                           Anesthesia Physical Anesthesia Plan  ASA: II  Anesthesia Plan: MAC   Post-op Pain Management:    Induction: Intravenous  Airway Management Planned: Mask  Additional Equipment:   Intra-op Plan:   Post-operative Plan:   Informed Consent:   Dental Advisory Given  Plan Discussed with: CRNA, Anesthesiologist and Surgeon  Anesthesia Plan Comments:        Anesthesia Quick Evaluation

## 2012-02-29 NOTE — Interval H&P Note (Signed)
History and Physical Interval Note:  02/29/2012 10:27 AM  [name]  has presented today for surgery, with the diagnosis of Ca lung  The various methods of treatment have been discussed with the patient and family. After consideration of risks, benefits and other options for treatment, the patient has consented to  Procedure(s) (LRB): INSERTION PORT-A-CATH (Left) as a surgical intervention .  The patients' history has been reviewed, patient examined, no change in status, stable for surgery.  I have reviewed the patients' chart and labs.  Questions were answered to the patient's satisfaction.     Cameron Proud

## 2012-02-29 NOTE — Progress Notes (Signed)
Put [employer] disability form on nurse's desk. °

## 2012-02-29 NOTE — Progress Notes (Signed)
Faxed [employer] disability form to [employer] @ [phone].

## 2012-03-01 ENCOUNTER — Other Ambulatory Visit: Payer: 59 | Admitting: Lab

## 2012-03-01 ENCOUNTER — Ambulatory Visit: Payer: 59

## 2012-03-01 ENCOUNTER — Encounter (HOSPITAL_COMMUNITY): Payer: Self-pay | Admitting: Thoracic Surgery

## 2012-03-08 ENCOUNTER — Other Ambulatory Visit (HOSPITAL_BASED_OUTPATIENT_CLINIC_OR_DEPARTMENT_OTHER): Payer: 59

## 2012-03-08 ENCOUNTER — Ambulatory Visit (HOSPITAL_BASED_OUTPATIENT_CLINIC_OR_DEPARTMENT_OTHER): Payer: 59

## 2012-03-08 ENCOUNTER — Ambulatory Visit: Payer: 59 | Admitting: Internal Medicine

## 2012-03-08 ENCOUNTER — Other Ambulatory Visit: Payer: 59 | Admitting: Lab

## 2012-03-08 VITALS — BP 113/77 | HR 97 | Temp 97.8°F

## 2012-03-08 DIAGNOSIS — C3490 Malignant neoplasm of unspecified part of unspecified bronchus or lung: Secondary | ICD-10-CM

## 2012-03-08 DIAGNOSIS — Z5111 Encounter for antineoplastic chemotherapy: Secondary | ICD-10-CM

## 2012-03-08 LAB — COMPREHENSIVE METABOLIC PANEL
ALT: 37 U/L — ABNORMAL HIGH (ref 0–35)
AST: 34 U/L (ref 0–37)
Albumin: 4.1 g/dL (ref 3.5–5.2)
Alkaline Phosphatase: 90 U/L (ref 39–117)
BUN: 10 mg/dL (ref 6–23)
CO2: 26 mEq/L (ref 19–32)
Calcium: 9.2 mg/dL (ref 8.4–10.5)
Chloride: 103 mEq/L (ref 96–112)
Creatinine, Ser: 0.59 mg/dL (ref 0.50–1.10)
Glucose, Bld: 97 mg/dL (ref 70–99)
Potassium: 3.8 mEq/L (ref 3.5–5.3)
Sodium: 139 mEq/L (ref 135–145)
Total Bilirubin: 0.2 mg/dL — ABNORMAL LOW (ref 0.3–1.2)
Total Protein: 7.1 g/dL (ref 6.0–8.3)

## 2012-03-08 LAB — CBC WITH DIFFERENTIAL/PLATELET
BASO%: 0.1 % (ref 0.0–2.0)
Basophils Absolute: 0 10*3/uL (ref 0.0–0.1)
EOS%: 0.4 % (ref 0.0–7.0)
Eosinophils Absolute: 0 10*3/uL (ref 0.0–0.5)
HCT: 39.7 % (ref 34.8–46.6)
HGB: 12.8 g/dL (ref 11.6–15.9)
LYMPH%: 21 % (ref 14.0–49.7)
MCH: 24.3 pg — ABNORMAL LOW (ref 25.1–34.0)
MCHC: 32.2 g/dL (ref 31.5–36.0)
MCV: 75.5 fL — ABNORMAL LOW (ref 79.5–101.0)
MONO#: 0.9 10*3/uL (ref 0.1–0.9)
MONO%: 8.1 % (ref 0.0–14.0)
NEUT#: 7.4 10*3/uL — ABNORMAL HIGH (ref 1.5–6.5)
NEUT%: 70.4 % (ref 38.4–76.8)
Platelets: 382 10*3/uL (ref 145–400)
RBC: 5.26 10*6/uL (ref 3.70–5.45)
RDW: 16.4 % — ABNORMAL HIGH (ref 11.2–14.5)
WBC: 10.5 10*3/uL — ABNORMAL HIGH (ref 3.9–10.3)
lymph#: 2.2 10*3/uL (ref 0.9–3.3)

## 2012-03-08 MED ORDER — ONDANSETRON 16 MG/50ML IVPB (CHCC)
16.0000 mg | Freq: Once | INTRAVENOUS | Status: AC
  Administered 2012-03-08: 16 mg via INTRAVENOUS

## 2012-03-08 MED ORDER — SODIUM CHLORIDE 0.9 % IV SOLN
500.0000 mg/m2 | Freq: Once | INTRAVENOUS | Status: AC
  Administered 2012-03-08: 800 mg via INTRAVENOUS
  Filled 2012-03-08: qty 32

## 2012-03-08 MED ORDER — SODIUM CHLORIDE 0.9 % IV SOLN
Freq: Once | INTRAVENOUS | Status: AC
  Administered 2012-03-08: 13:00:00 via INTRAVENOUS

## 2012-03-08 MED ORDER — HEPARIN SOD (PORK) LOCK FLUSH 100 UNIT/ML IV SOLN
500.0000 [IU] | Freq: Once | INTRAVENOUS | Status: AC | PRN
  Administered 2012-03-08: 500 [IU]
  Filled 2012-03-08: qty 5

## 2012-03-08 MED ORDER — DEXAMETHASONE SODIUM PHOSPHATE 4 MG/ML IJ SOLN
20.0000 mg | Freq: Once | INTRAMUSCULAR | Status: AC
  Administered 2012-03-08: 20 mg via INTRAVENOUS

## 2012-03-08 MED ORDER — SODIUM CHLORIDE 0.9 % IJ SOLN
10.0000 mL | INTRAMUSCULAR | Status: DC | PRN
  Administered 2012-03-08: 10 mL
  Filled 2012-03-08: qty 10

## 2012-03-08 MED ORDER — SODIUM CHLORIDE 0.9 % IV SOLN
600.0000 mg | Freq: Once | INTRAVENOUS | Status: AC
  Administered 2012-03-08: 600 mg via INTRAVENOUS
  Filled 2012-03-08: qty 60

## 2012-03-08 NOTE — Patient Instructions (Signed)
[facility] Discharge Instructions for Patients Receiving Chemotherapy  Today you received the following chemotherapy agents: Alimta and Carboplatin  To help prevent nausea and vomiting after your treatment, we encourage you to take your nausea medication:      Compazine (prochlorperazine) 10 mg every 6 hours as needed for nausea Begin taking it at bedtime tonight and take it every 6 hours if needed. Avoid spicy or greasy foods for 1st 24 hours. Drink a lot of fluids.  Continue your  Decadron (dexamethaxone) 4 mg twice daily through tomorrow. You will begin this again the day prior to your next treatment. Continue your folic acid tablet daily   If you develop nausea and vomiting that is not controlled by your nausea medication, call the clinic. If it is after clinic hours your family physician or the after hours number for the clinic or go to the Emergency Department.   BELOW ARE SYMPTOMS THAT SHOULD BE REPORTED IMMEDIATELY:  *FEVER GREATER THAN 100.5 F  *CHILLS WITH OR WITHOUT FEVER  NAUSEA AND VOMITING THAT IS NOT CONTROLLED WITH YOUR NAUSEA MEDICATION  *UNUSUAL SHORTNESS OF BREATH  *UNUSUAL BRUISING OR BLEEDING  TENDERNESS IN MOUTH AND THROAT WITH OR WITHOUT PRESENCE OF ULCERS  *URINARY PROBLEMS  *BOWEL PROBLEMS  UNUSUAL RASH  Remember to avoid getting pregnant while on chemotherapy Items with * indicate a potential emergency and should be followed up as soon as possible.  One of the nurses will contact you 24 hours after your treatment. Please let the nurse know about any problems that you may have experienced. Feel free to call the clinic you have any questions or concerns. The clinic phone number is [phone].   I have been informed and understand all the instructions given to me. I know to contact the clinic, my physician, or go to the Emergency Department if any problems should occur. I do not have any questions at this time, but understand that I  may call the clinic during office hours   should I have any questions or need assistance in obtaining follow up care.    __________________________________________  _____________  __________ Signature of Patient or Authorized Representative            Date                   Time    __________________________________________ Nurse's Signature

## 2012-03-09 ENCOUNTER — Telehealth: Payer: Self-pay | Admitting: *Deleted

## 2012-03-09 NOTE — Telephone Encounter (Signed)
NO PROBLEMS OR QUESTIONS AT THIS TIME. PT. IS EATING AND FORCING FLUIDS. HER BOWEL MOVEMENTS ARE NORMAL. FAMILY WILL CALL THIS OFFICE IF THE NEED ARISES.

## 2012-03-15 ENCOUNTER — Other Ambulatory Visit (HOSPITAL_BASED_OUTPATIENT_CLINIC_OR_DEPARTMENT_OTHER): Payer: 59 | Admitting: Lab

## 2012-03-15 ENCOUNTER — Telehealth: Payer: Self-pay | Admitting: Internal Medicine

## 2012-03-15 ENCOUNTER — Other Ambulatory Visit: Payer: 59 | Admitting: Lab

## 2012-03-15 ENCOUNTER — Ambulatory Visit: Payer: 59 | Admitting: Internal Medicine

## 2012-03-15 VITALS — BP 124/88 | HR 106 | Temp 97.7°F | Ht 60.0 in | Wt 128.2 lb

## 2012-03-15 DIAGNOSIS — C3490 Malignant neoplasm of unspecified part of unspecified bronchus or lung: Secondary | ICD-10-CM

## 2012-03-15 LAB — CBC WITH DIFFERENTIAL/PLATELET
BASO%: 0.5 % (ref 0.0–2.0)
Basophils Absolute: 0 10*3/uL (ref 0.0–0.1)
EOS%: 0.5 % (ref 0.0–7.0)
Eosinophils Absolute: 0 10*3/uL (ref 0.0–0.5)
HCT: 39.8 % (ref 34.8–46.6)
HGB: 13 g/dL (ref 11.6–15.9)
LYMPH%: 17.6 % (ref 14.0–49.7)
MCH: 24.7 pg — ABNORMAL LOW (ref 25.1–34.0)
MCHC: 32.6 g/dL (ref 31.5–36.0)
MCV: 75.7 fL — ABNORMAL LOW (ref 79.5–101.0)
MONO#: 0.4 10*3/uL (ref 0.1–0.9)
MONO%: 7.2 % (ref 0.0–14.0)
NEUT#: 4.3 10*3/uL (ref 1.5–6.5)
NEUT%: 74.2 % (ref 38.4–76.8)
Platelets: 327 10*3/uL (ref 145–400)
RBC: 5.26 10*6/uL (ref 3.70–5.45)
RDW: 16.8 % — ABNORMAL HIGH (ref 11.2–14.5)
WBC: 5.7 10*3/uL (ref 3.9–10.3)
lymph#: 1 10*3/uL (ref 0.9–3.3)
nRBC: 0 % (ref 0–0)

## 2012-03-15 LAB — COMPREHENSIVE METABOLIC PANEL
ALT: 97 U/L — ABNORMAL HIGH (ref 0–35)
AST: 79 U/L — ABNORMAL HIGH (ref 0–37)
Albumin: 4.1 g/dL (ref 3.5–5.2)
Alkaline Phosphatase: 98 U/L (ref 39–117)
BUN: 10 mg/dL (ref 6–23)
CO2: 27 mEq/L (ref 19–32)
Calcium: 8.9 mg/dL (ref 8.4–10.5)
Chloride: 99 mEq/L (ref 96–112)
Creatinine, Ser: 0.52 mg/dL (ref 0.50–1.10)
Glucose, Bld: 133 mg/dL — ABNORMAL HIGH (ref 70–99)
Potassium: 4.3 mEq/L (ref 3.5–5.3)
Sodium: 137 mEq/L (ref 135–145)
Total Bilirubin: 0.4 mg/dL (ref 0.3–1.2)
Total Protein: 7 g/dL (ref 6.0–8.3)

## 2012-03-15 NOTE — Telephone Encounter (Signed)
Added f/u appt for 5/2. Pt son given schedule for April/may.

## 2012-03-15 NOTE — Progress Notes (Signed)
[facility] [phone]   [phone]  OFFICE PROGRESS NOTE  PRINCIPAL DIAGNOSIS: Local recurrence of non-small cell lung cancer, adenocarcinoma, initially diagnosed as stage IB (T2a N0 M0) in March of 2010.   PRIOR THERAPY:  1. Status post right lower lobectomy under the care of Dr. Laneta Simmers on 04/08/2009. 2. Status post palliative radiotherapy to the right lower lobe recurrent lung mass under the care of Dr. Mitzi Hansen, completed on June 08, 2011.  CURRENT THERAPY: Systemic chemotherapy with carboplatin for AUC of 5 and Alimta 500 mg/M2 every 3 weeks. She is status post 1 cycle.   INTERVAL HISTORY: [name] 51 y.o. female returns to the clinic today for followup visit accompanied by her son. The patient tolerated the first cycle of her chemotherapy fairly well except for mild nausea. She denied having any significant fever or chills. No significant weight loss or night sweats. She has no chest pain but continues to have shortness of breath with exertion.  MEDICAL HISTORY: Past Medical History  Diagnosis Date  . Lung cancer     right lower lobe adenocarcinoma  . History of radiation therapy 05/02/11 to 06/08/11    right lung    ALLERGIES:  is allergic to codeine.  MEDICATIONS:  Current Outpatient Prescriptions  Medication Sig Dispense Refill  . chlorpheniramine-HYDROcodone (TUSSIONEX) 10-8 MG/5ML LQCR Take 5 mLs by mouth every 12 (twelve) hours as needed. For cough      . folic acid (FOLVITE) 1 MG tablet Take 1 tablet (1 mg total) by mouth daily.  30 tablet  1  . ipratropium (ATROVENT) 0.03 % nasal spray Place 1 spray into the nose 2 (two) times daily.       Marland Kitchen lidocaine-prilocaine (EMLA) cream Apply to Port-A-Cath prior to tx PRN  5 g  prn 1 year  . acetaminophen (TYLENOL) 325 MG tablet Take 650 mg by mouth every 6 (six) hours as needed. For pain      . dexamethasone (DECADRON) 4 MG tablet Take 4 mg by mouth 2 (two) times daily. 1 tab PO twice daily  day before, day of, and day after chemotherapy      . ibuprofen (ADVIL,MOTRIN) 100 MG tablet Take 200 mg by mouth every 6 (six) hours as needed. For pain      . Multiple Vitamin (MULTIVITAMIN) tablet Take 1 tablet by mouth daily.        . prochlorperazine (COMPAZINE) 10 MG tablet Take 10 mg by mouth every 6 (six) hours as needed. For nausea        SURGICAL HISTORY:  Past Surgical History  Procedure Date  . Lung lobectomy 04/18/2009    RLL  . Portacath placement 02/29/2012    Procedure: INSERTION PORT-A-CATH;  Surgeon: Ines Bloomer, MD;  Location: [facility];  Service: Thoracic;  Laterality: Left;  PowerPort 8 F attachable in left internal jugular.    REVIEW OF SYSTEMS:  A comprehensive review of systems was negative except for: Constitutional: positive for fatigue Respiratory: positive for dyspnea on exertion   PHYSICAL EXAMINATION: General appearance: alert, cooperative and no distress Head: Normocephalic, without obvious abnormality, atraumatic Neck: no adenopathy Lymph nodes: Cervical, supraclavicular, and axillary nodes normal. Resp: clear to auscultation bilaterally Back: symmetric, no curvature. ROM normal. No CVA tenderness. Cardio: regular rate and rhythm, S1, S2 normal, no murmur, click, rub or gallop GI: soft, non-tender; bowel sounds normal; no masses,  no organomegaly Extremities: extremities normal, atraumatic, no cyanosis or edema Neurologic: Alert  and oriented X 3, normal strength and tone. Normal symmetric reflexes. Normal coordination and gait  ECOG PERFORMANCE STATUS: 1 - Symptomatic but completely ambulatory  Blood pressure 124/88, pulse 106, temperature 97.7 F (36.5 C), temperature source Oral, height 5' (1.524 m), weight 128 lb 3.2 oz (58.151 kg), last menstrual period 02/10/2012.  LABORATORY DATA: Lab Results  Component Value Date   WBC 5.7 03/15/2012   HGB 13.0 03/15/2012   HCT 39.8 03/15/2012   MCV 75.7* 03/15/2012   PLT 327 03/15/2012      Chemistry        Component Value Date/Time   NA 139 03/08/2012 1307   NA 141 01/06/2012 0804   K 3.8 03/08/2012 1307   K 4.6 01/06/2012 0804   CL 103 03/08/2012 1307   CL 98 01/06/2012 0804   CO2 26 03/08/2012 1307   CO2 28 01/06/2012 0804   BUN 10 03/08/2012 1307   BUN 10 01/06/2012 0804   CREATININE 0.59 03/08/2012 1307   CREATININE 0.6 01/06/2012 0804      Component Value Date/Time   CALCIUM 9.2 03/08/2012 1307   CALCIUM 8.9 01/06/2012 0804   ALKPHOS 90 03/08/2012 1307   ALKPHOS 64 01/06/2012 0804   AST 34 03/08/2012 1307   AST 27 01/06/2012 0804   ALT 37* 03/08/2012 1307   BILITOT 0.2* 03/08/2012 1307   BILITOT 0.60 01/06/2012 0804       RADIOGRAPHIC STUDIES: Dg Chest 2 View Within Previous 72 Hours.  Films Obtained On Friday Are Acceptable For Monday And Tuesday Cases  02/29/2012  *RADIOLOGY REPORT*  Clinical Data: Preop surgery.  CHEST - 2 VIEW  Comparison: 01/06/2012 chest CT.  Findings: Postsurgical changes right lung with right upper lobe nodule adjacent to the suture line better detected on the recent to CT.  Right-sided pleural effusion.  Elevated right hemidiaphragm.  Mild cardiomegaly and central pulmonary vascular prominence. Tortuous aorta.  IMPRESSION: Postsurgical changes right lung with right upper lobe nodule adjacent to the suture line better detected on the recent to CT.  Right-sided pleural effusion.  Elevated right hemidiaphragm.  Mild cardiomegaly and central pulmonary vascular prominence.  Tortuous aorta.  Original Report Authenticated By: Fuller Canada, M.D.   Dg Chest Port 1 View  02/29/2012  *RADIOLOGY REPORT*  Clinical Data: Port-A-Cath placement.  PORTABLE CHEST - 1 VIEW  Comparison: 02/29/2012 9:14 a.m.  Findings: Left power port has been placed.  The tip is at the level of the caval atrial junction.  No pneumothorax.  Mild kink proximal aspect of the catheter may represent expected findings.  Clinical correlation recommended.  Attention to this on follow-up two-view exam.  Remainder of findings  without change.  IMPRESSION: Left power port has been placed.  The tip is at the level of the caval atrial junction.  No pneumothorax.  Mild kink proximal aspect of the catheter may represent expected findings.  Clinical correlation recommended.  Attention to this on follow-up two-view exam.  Original Report Authenticated By: Fuller Canada, M.D.    ASSESSMENT: This is a very pleasant 51 years old Asian female with a recurrent non-small cell lung cancer, adenocarcinoma. She is currently on systemic chemotherapy with carboplatin and Alimta status post 1 cycle. The patient is tolerating her treatment fairly well.  PLAN: She has no significant complaints today. The patient would come back for followup visit in 2 weeks with the start of cycle #2. She was advised to use her nausea medicine on as needed basis. She was also  advised to call me immediately if she has any concerning symptoms in the interval.  All questions were answered. The patient knows to call the clinic with any problems, questions or concerns. We can certainly see the patient much sooner if necessary.

## 2012-03-19 ENCOUNTER — Telehealth: Payer: Self-pay | Admitting: *Deleted

## 2012-03-19 NOTE — Telephone Encounter (Signed)
Spoke with patient's son Ra Sin.  Son says she is doing well.  No side effects and he denies any questions at this time.  Says he will call if needed.  Encouraged to drink lots of water.

## 2012-03-19 NOTE — Telephone Encounter (Signed)
Message copied by Augusto Garbe on Mon Mar 19, 2012 12:28 PM ------      Message from: Wandalee Ferdinand      Created: Thu Mar 08, 2012  5:02 PM      Regarding: Chemo Follow Up Call-Due 03/09/12      Contact: [phone]       1st Alimta/Carbo on 03/08/12 Dr. Arbutus Ped      Call son, Ra Sin after 11am (only English speaking person in home)

## 2012-03-21 ENCOUNTER — Telehealth: Payer: Self-pay | Admitting: Medical Oncology

## 2012-03-21 ENCOUNTER — Other Ambulatory Visit: Payer: Self-pay | Admitting: Medical Oncology

## 2012-03-21 DIAGNOSIS — C3490 Malignant neoplasm of unspecified part of unspecified bronchus or lung: Secondary | ICD-10-CM

## 2012-03-21 MED ORDER — METHYLPREDNISOLONE 4 MG PO KIT: PACK | ORAL | Status: AC

## 2012-03-21 NOTE — Telephone Encounter (Signed)
Confirmed appointments for next week.

## 2012-03-22 ENCOUNTER — Other Ambulatory Visit (HOSPITAL_BASED_OUTPATIENT_CLINIC_OR_DEPARTMENT_OTHER): Payer: 59 | Admitting: Lab

## 2012-03-22 ENCOUNTER — Ambulatory Visit: Payer: 59

## 2012-03-22 DIAGNOSIS — C3430 Malignant neoplasm of lower lobe, unspecified bronchus or lung: Secondary | ICD-10-CM

## 2012-03-22 DIAGNOSIS — C3490 Malignant neoplasm of unspecified part of unspecified bronchus or lung: Secondary | ICD-10-CM

## 2012-03-22 LAB — CBC WITH DIFFERENTIAL/PLATELET
BASO%: 0.1 % (ref 0.0–2.0)
Basophils Absolute: 0 10*3/uL (ref 0.0–0.1)
EOS%: 0.4 % (ref 0.0–7.0)
Eosinophils Absolute: 0 10*3/uL (ref 0.0–0.5)
HCT: 38.9 % (ref 34.8–46.6)
HGB: 12.7 g/dL (ref 11.6–15.9)
LYMPH%: 13.5 % — ABNORMAL LOW (ref 14.0–49.7)
MCH: 25.1 pg (ref 25.1–34.0)
MCHC: 32.6 g/dL (ref 31.5–36.0)
MCV: 77 fL — ABNORMAL LOW (ref 79.5–101.0)
MONO#: 0.8 10*3/uL (ref 0.1–0.9)
MONO%: 10.9 % (ref 0.0–14.0)
NEUT#: 5.8 10*3/uL (ref 1.5–6.5)
NEUT%: 75.1 % (ref 38.4–76.8)
Platelets: 162 10*3/uL (ref 145–400)
RBC: 5.05 10*6/uL (ref 3.70–5.45)
RDW: 17.3 % — ABNORMAL HIGH (ref 11.2–14.5)
WBC: 7.7 10*3/uL (ref 3.9–10.3)
lymph#: 1 10*3/uL (ref 0.9–3.3)
nRBC: 0 % (ref 0–0)

## 2012-03-22 LAB — COMPREHENSIVE METABOLIC PANEL
ALT: 135 U/L — ABNORMAL HIGH (ref 0–35)
AST: 102 U/L — ABNORMAL HIGH (ref 0–37)
Albumin: 4 g/dL (ref 3.5–5.2)
Alkaline Phosphatase: 117 U/L (ref 39–117)
BUN: 8 mg/dL (ref 6–23)
CO2: 27 mEq/L (ref 19–32)
Calcium: 9.1 mg/dL (ref 8.4–10.5)
Chloride: 102 mEq/L (ref 96–112)
Creatinine, Ser: 0.59 mg/dL (ref 0.50–1.10)
Glucose, Bld: 105 mg/dL — ABNORMAL HIGH (ref 70–99)
Potassium: 4.2 mEq/L (ref 3.5–5.3)
Sodium: 138 mEq/L (ref 135–145)
Total Bilirubin: 0.4 mg/dL (ref 0.3–1.2)
Total Protein: 6.6 g/dL (ref 6.0–8.3)

## 2012-03-28 ENCOUNTER — Encounter: Payer: Self-pay | Admitting: Internal Medicine

## 2012-03-28 NOTE — Progress Notes (Signed)
Put [employer] disability form on nurse's desk. °

## 2012-03-29 ENCOUNTER — Ambulatory Visit: Payer: 59

## 2012-03-29 ENCOUNTER — Other Ambulatory Visit: Payer: Self-pay | Admitting: Internal Medicine

## 2012-03-29 ENCOUNTER — Encounter: Payer: Self-pay | Admitting: Internal Medicine

## 2012-03-29 ENCOUNTER — Telehealth: Payer: Self-pay | Admitting: Internal Medicine

## 2012-03-29 ENCOUNTER — Ambulatory Visit (HOSPITAL_BASED_OUTPATIENT_CLINIC_OR_DEPARTMENT_OTHER): Payer: 59 | Admitting: Physician Assistant

## 2012-03-29 ENCOUNTER — Other Ambulatory Visit (HOSPITAL_BASED_OUTPATIENT_CLINIC_OR_DEPARTMENT_OTHER): Payer: 59

## 2012-03-29 ENCOUNTER — Ambulatory Visit (HOSPITAL_BASED_OUTPATIENT_CLINIC_OR_DEPARTMENT_OTHER): Payer: 59

## 2012-03-29 VITALS — BP 124/88 | HR 97 | Temp 98.7°F | Ht 60.0 in | Wt 132.0 lb

## 2012-03-29 DIAGNOSIS — C3490 Malignant neoplasm of unspecified part of unspecified bronchus or lung: Secondary | ICD-10-CM

## 2012-03-29 DIAGNOSIS — Z5111 Encounter for antineoplastic chemotherapy: Secondary | ICD-10-CM

## 2012-03-29 DIAGNOSIS — C3430 Malignant neoplasm of lower lobe, unspecified bronchus or lung: Secondary | ICD-10-CM

## 2012-03-29 LAB — CBC WITH DIFFERENTIAL/PLATELET
BASO%: 0.2 % (ref 0.0–2.0)
Basophils Absolute: 0 10*3/uL (ref 0.0–0.1)
EOS%: 0 % (ref 0.0–7.0)
Eosinophils Absolute: 0 10*3/uL (ref 0.0–0.5)
HCT: 40.3 % (ref 34.8–46.6)
HGB: 13 g/dL (ref 11.6–15.9)
LYMPH%: 9.8 % — ABNORMAL LOW (ref 14.0–49.7)
MCH: 24.7 pg — ABNORMAL LOW (ref 25.1–34.0)
MCHC: 32.3 g/dL (ref 31.5–36.0)
MCV: 76.6 fL — ABNORMAL LOW (ref 79.5–101.0)
MONO#: 0.7 10*3/uL (ref 0.1–0.9)
MONO%: 6.7 % (ref 0.0–14.0)
NEUT#: 9.1 10*3/uL — ABNORMAL HIGH (ref 1.5–6.5)
NEUT%: 83.3 % — ABNORMAL HIGH (ref 38.4–76.8)
Platelets: 265 10*3/uL (ref 145–400)
RBC: 5.26 10*6/uL (ref 3.70–5.45)
RDW: 16.8 % — ABNORMAL HIGH (ref 11.2–14.5)
WBC: 10.9 10*3/uL — ABNORMAL HIGH (ref 3.9–10.3)
lymph#: 1.1 10*3/uL (ref 0.9–3.3)
nRBC: 0 % (ref 0–0)

## 2012-03-29 LAB — COMPREHENSIVE METABOLIC PANEL
ALT: 81 U/L — ABNORMAL HIGH (ref 0–35)
AST: 65 U/L — ABNORMAL HIGH (ref 0–37)
Albumin: 4 g/dL (ref 3.5–5.2)
Alkaline Phosphatase: 133 U/L — ABNORMAL HIGH (ref 39–117)
BUN: 13 mg/dL (ref 6–23)
CO2: 26 mEq/L (ref 19–32)
Calcium: 9 mg/dL (ref 8.4–10.5)
Chloride: 100 mEq/L (ref 96–112)
Creatinine, Ser: 0.49 mg/dL — ABNORMAL LOW (ref 0.50–1.10)
Glucose, Bld: 113 mg/dL — ABNORMAL HIGH (ref 70–99)
Potassium: 4.4 mEq/L (ref 3.5–5.3)
Sodium: 137 mEq/L (ref 135–145)
Total Bilirubin: 0.2 mg/dL — ABNORMAL LOW (ref 0.3–1.2)
Total Protein: 7.4 g/dL (ref 6.0–8.3)

## 2012-03-29 MED ORDER — SODIUM CHLORIDE 0.9 % IV SOLN
520.0000 mg | Freq: Once | INTRAVENOUS | Status: AC
  Administered 2012-03-29: 520 mg via INTRAVENOUS
  Filled 2012-03-29: qty 52

## 2012-03-29 MED ORDER — DEXAMETHASONE SODIUM PHOSPHATE 4 MG/ML IJ SOLN
20.0000 mg | Freq: Once | INTRAMUSCULAR | Status: AC
  Administered 2012-03-29: 20 mg via INTRAVENOUS

## 2012-03-29 MED ORDER — ONDANSETRON 16 MG/50ML IVPB (CHCC)
16.0000 mg | Freq: Once | INTRAVENOUS | Status: AC
  Administered 2012-03-29: 16 mg via INTRAVENOUS

## 2012-03-29 MED ORDER — HEPARIN SOD (PORK) LOCK FLUSH 100 UNIT/ML IV SOLN
500.0000 [IU] | Freq: Once | INTRAVENOUS | Status: AC
  Administered 2012-03-29: 500 [IU] via INTRAVENOUS
  Filled 2012-03-29: qty 5

## 2012-03-29 MED ORDER — SODIUM CHLORIDE 0.9 % IV SOLN
500.0000 mg/m2 | Freq: Once | INTRAVENOUS | Status: AC
  Administered 2012-03-29: 800 mg via INTRAVENOUS
  Filled 2012-03-29: qty 32

## 2012-03-29 MED ORDER — SODIUM CHLORIDE 0.9 % IJ SOLN
10.0000 mL | INTRAMUSCULAR | Status: DC | PRN
  Administered 2012-03-29: 10 mL via INTRAVENOUS
  Filled 2012-03-29: qty 10

## 2012-03-29 NOTE — Patient Instructions (Signed)
[facility] Discharge Instructions for Patients Receiving Chemotherapy  Today you received the following chemotherapy agents Carboplatin and Alimta.  To help prevent nausea and vomiting after your treatment, we encourage you to take your nausea medication as prescribed by your physician.  If you develop nausea and vomiting that is not controlled by your nausea medication, call the clinic.   BELOW ARE SYMPTOMS THAT SHOULD BE REPORTED IMMEDIATELY:  *FEVER GREATER THAN 100.5 F  *CHILLS WITH OR WITHOUT FEVER  NAUSEA AND VOMITING THAT IS NOT CONTROLLED WITH YOUR NAUSEA MEDICATION  *UNUSUAL SHORTNESS OF BREATH  *UNUSUAL BRUISING OR BLEEDING  TENDERNESS IN MOUTH AND THROAT WITH OR WITHOUT PRESENCE OF ULCERS  *URINARY PROBLEMS  *BOWEL PROBLEMS  UNUSUAL RASH Items with * indicate a potential emergency and should be followed up as soon as possible.  Feel free to call the clinic you have any questions or concerns. The clinic phone number is [phone].

## 2012-03-29 NOTE — Telephone Encounter (Signed)
appts made and printed for pt aom °

## 2012-03-29 NOTE — Progress Notes (Unsigned)
Faxed disability form to [employer] @ [phone] °

## 2012-03-30 NOTE — Progress Notes (Signed)
[facility] [phone]   [phone]  OFFICE PROGRESS NOTE  PRINCIPAL DIAGNOSIS: Local recurrence of non-small cell lung cancer, adenocarcinoma, initially diagnosed as stage IB (T2a N0 M0) in March of 2010.   PRIOR THERAPY:  1. Status post right lower lobectomy under the care of Dr. Laneta Simmers on 04/08/2009. 2. Status post palliative radiotherapy to the right lower lobe recurrent lung mass under the care of Dr. Mitzi Hansen, completed on June 08, 2011.  CURRENT THERAPY: Systemic chemotherapy with carboplatin for AUC of 5 and Alimta 500 mg/M2 every 3 weeks. She is status post 1 cycle.   INTERVAL HISTORY: [name] 51 y.o. female returns to the clinic today for followup visit accompanied by her son. The patient tolerated the first cycle of her chemotherapy fairly well except for mild nausea. She also had diarrhea that lasted approximately one day after her first cycle of chemotherapy no problems with constipation and no further episodes of diarrhea. She also reports mild headaches as well as loss of taste. She states that she is trying to make her 34. She's had low-grade temperatures without chills. She has cough and notices a shortness of breath compared to before she started having radiation therapy She denied having any significant fever or chills. No significant weight loss or night sweats. She has no chest pain but continues to have shortness of breath with exertion.  MEDICAL HISTORY: Past Medical History  Diagnosis Date  . Lung cancer     right lower lobe adenocarcinoma  . History of radiation therapy 05/02/11 to 06/08/11    right lung    ALLERGIES:  is allergic to codeine.  MEDICATIONS:  Current Outpatient Prescriptions  Medication Sig Dispense Refill  . acetaminophen (TYLENOL) 325 MG tablet Take 650 mg by mouth every 6 (six) hours as needed. For pain      . benzonatate (TESSALON) 100 MG capsule       . chlorpheniramine-HYDROcodone (TUSSIONEX) 10-8  MG/5ML LQCR Take 5 mLs by mouth every 12 (twelve) hours as needed. For cough      . dexamethasone (DECADRON) 4 MG tablet Take 4 mg by mouth 2 (two) times daily. 1 tab PO twice daily day before, day of, and day after chemotherapy      . folic acid (FOLVITE) 1 MG tablet Take 1 tablet (1 mg total) by mouth daily.  30 tablet  1  . HYDROcodone-acetaminophen (VICODIN) 5-500 MG per tablet       . ibuprofen (ADVIL,MOTRIN) 100 MG tablet Take 200 mg by mouth every 6 (six) hours as needed. For pain      . ipratropium (ATROVENT) 0.03 % nasal spray Place 1 spray into the nose 2 (two) times daily.       Marland Kitchen lidocaine-prilocaine (EMLA) cream Apply to Port-A-Cath prior to tx PRN  5 g  prn 1 year  . Multiple Vitamin (MULTIVITAMIN) tablet Take 1 tablet by mouth daily.        . prochlorperazine (COMPAZINE) 10 MG tablet Take 10 mg by mouth every 6 (six) hours as needed. For nausea       No current facility-administered medications for this visit.   Facility-Administered Medications Ordered in Other Visits  Medication Dose Route Frequency Provider Last Rate Last Dose  . CARBOplatin (PARAPLATIN) 520 mg in sodium chloride 0.9 % 250 mL chemo infusion  520 mg Intravenous Once Si Gaul, MD   520 mg at 03/29/12 1546  . heparin lock flush 100 unit/mL  500  Units Intravenous Once Si Gaul, MD   500 Units at 03/29/12 1627  . ondansetron (ZOFRAN) IVPB 16 mg  16 mg Intravenous Once Si Gaul, MD   16 mg at 03/29/12 1502  . PEMEtrexed (ALIMTA) 800 mg in sodium chloride 0.9 % 100 mL chemo infusion  500 mg/m2 (Treatment Plan Actual) Intravenous Once Si Gaul, MD   800 mg at 03/29/12 1533  . DISCONTD: sodium chloride 0.9 % injection 10 mL  10 mL Intravenous PRN Si Gaul, MD   10 mL at 03/29/12 1627    SURGICAL HISTORY:  Past Surgical History  Procedure Date  . Lung lobectomy 04/18/2009    RLL  . Portacath placement 02/29/2012    Procedure: INSERTION PORT-A-CATH;  Surgeon: Ines Bloomer, MD;   Location: [facility] OR;  Service: Thoracic;  Laterality: Left;  PowerPort 8 F attachable in left internal jugular.    REVIEW OF SYSTEMS:  A comprehensive review of systems was negative except for: Constitutional: positive for fatigue Respiratory: positive for cough and dyspnea on exertion Gastrointestinal: positive for diarrhea and nausea   PHYSICAL EXAMINATION: General appearance: alert, cooperative and no distress Head: Normocephalic, without obvious abnormality, atraumatic Neck: no adenopathy Lymph nodes: Cervical, supraclavicular, and axillary nodes normal. Resp: clear to auscultation bilaterally Back: symmetric, no curvature. ROM normal. No CVA tenderness. Cardio: regular rate and rhythm, S1, S2 normal, no murmur, click, rub or gallop GI: soft, non-tender; bowel sounds normal; no masses,  no organomegaly Extremities: extremities normal, atraumatic, no cyanosis or edema Neurologic: Alert and oriented X 3, normal strength and tone. Normal symmetric reflexes. Normal coordination and gait  ECOG PERFORMANCE STATUS: 1 - Symptomatic but completely ambulatory  Blood pressure 124/88, pulse 97, temperature 98.7 F (37.1 C), temperature source Oral, height 5' (1.524 m), weight 132 lb (59.875 kg).  LABORATORY DATA: Lab Results  Component Value Date   WBC 10.9* 03/29/2012   HGB 13.0 03/29/2012   HCT 40.3 03/29/2012   MCV 76.6* 03/29/2012   PLT 265 03/29/2012      Chemistry      Component Value Date/Time   NA 137 03/29/2012 1338   NA 141 01/06/2012 0804   K 4.4 03/29/2012 1338   K 4.6 01/06/2012 0804   CL 100 03/29/2012 1338   CL 98 01/06/2012 0804   CO2 26 03/29/2012 1338   CO2 28 01/06/2012 0804   BUN 13 03/29/2012 1338   BUN 10 01/06/2012 0804   CREATININE 0.49* 03/29/2012 1338   CREATININE 0.6 01/06/2012 0804      Component Value Date/Time   CALCIUM 9.0 03/29/2012 1338   CALCIUM 8.9 01/06/2012 0804   ALKPHOS 133* 03/29/2012 1338   ALKPHOS 64 01/06/2012 0804   AST 65* 03/29/2012 1338   AST 27 01/06/2012 0804   ALT 81*  03/29/2012 1338   BILITOT 0.2* 03/29/2012 1338   BILITOT 0.60 01/06/2012 0804       RADIOGRAPHIC STUDIES: Dg Chest 2 View Within Previous 72 Hours.  Films Obtained On Friday Are Acceptable For Monday And Tuesday Cases  02/29/2012  *RADIOLOGY REPORT*  Clinical Data: Preop surgery.  CHEST - 2 VIEW  Comparison: 01/06/2012 chest CT.  Findings: Postsurgical changes right lung with right upper lobe nodule adjacent to the suture line better detected on the recent to CT.  Right-sided pleural effusion.  Elevated right hemidiaphragm.  Mild cardiomegaly and central pulmonary vascular prominence. Tortuous aorta.  IMPRESSION: Postsurgical changes right lung with right upper lobe nodule adjacent to the suture line better detected  on the recent to CT.  Right-sided pleural effusion.  Elevated right hemidiaphragm.  Mild cardiomegaly and central pulmonary vascular prominence.  Tortuous aorta.  Original Report Authenticated By: Fuller Canada, M.D.   Dg Chest Port 1 View  02/29/2012  *RADIOLOGY REPORT*  Clinical Data: Port-A-Cath placement.  PORTABLE CHEST - 1 VIEW  Comparison: 02/29/2012 9:14 a.m.  Findings: Left power port has been placed.  The tip is at the level of the caval atrial junction.  No pneumothorax.  Mild kink proximal aspect of the catheter may represent expected findings.  Clinical correlation recommended.  Attention to this on follow-up two-view exam.  Remainder of findings without change.  IMPRESSION: Left power port has been placed.  The tip is at the level of the caval atrial junction.  No pneumothorax.  Mild kink proximal aspect of the catheter may represent expected findings.  Clinical correlation recommended.  Attention to this on follow-up two-view exam.  Original Report Authenticated By: Fuller Canada, M.D.    ASSESSMENT/PLAN: This is a very pleasant 51 years old Asian female with a recurrent non-small cell lung cancer, adenocarcinoma. She is currently on systemic chemotherapy with carboplatin and  Alimta status post 1 cycle. The patient is tolerating her treatment fairly well. The patient was discussed with Dr. Arbutus Ped. She'll proceed with her second cycle of systemic chemotherapy with carboplatin and Alimta. She will continue with weekly labs consisting of a CBC differential and C. met. She'll return in 3 weeks prior to cycle #3 with a repeat CBC differential and C. met.   Laural Benes, Desirea Mizrahi E, PA-C   All questions were answered. The patient knows to call the clinic with any problems, questions or concerns. We can certainly see the patient much sooner if necessary.

## 2012-04-05 ENCOUNTER — Other Ambulatory Visit (HOSPITAL_BASED_OUTPATIENT_CLINIC_OR_DEPARTMENT_OTHER): Payer: 59

## 2012-04-05 DIAGNOSIS — C3490 Malignant neoplasm of unspecified part of unspecified bronchus or lung: Secondary | ICD-10-CM

## 2012-04-05 LAB — COMPREHENSIVE METABOLIC PANEL
ALT: 65 U/L — ABNORMAL HIGH (ref 0–35)
AST: 61 U/L — ABNORMAL HIGH (ref 0–37)
Albumin: 4.2 g/dL (ref 3.5–5.2)
Alkaline Phosphatase: 109 U/L (ref 39–117)
BUN: 10 mg/dL (ref 6–23)
CO2: 24 mEq/L (ref 19–32)
Calcium: 8.9 mg/dL (ref 8.4–10.5)
Chloride: 100 mEq/L (ref 96–112)
Creatinine, Ser: 0.57 mg/dL (ref 0.50–1.10)
Glucose, Bld: 115 mg/dL — ABNORMAL HIGH (ref 70–99)
Potassium: 4 mEq/L (ref 3.5–5.3)
Sodium: 135 mEq/L (ref 135–145)
Total Bilirubin: 0.3 mg/dL (ref 0.3–1.2)
Total Protein: 6.9 g/dL (ref 6.0–8.3)

## 2012-04-05 LAB — CBC WITH DIFFERENTIAL/PLATELET
BASO%: 0.4 % (ref 0.0–2.0)
Basophils Absolute: 0 10*3/uL (ref 0.0–0.1)
EOS%: 0.2 % (ref 0.0–7.0)
Eosinophils Absolute: 0 10*3/uL (ref 0.0–0.5)
HCT: 39.8 % (ref 34.8–46.6)
HGB: 12.9 g/dL (ref 11.6–15.9)
LYMPH%: 23.1 % (ref 14.0–49.7)
MCH: 24.7 pg — ABNORMAL LOW (ref 25.1–34.0)
MCHC: 32.4 g/dL (ref 31.5–36.0)
MCV: 76.4 fL — ABNORMAL LOW (ref 79.5–101.0)
MONO#: 0.5 10*3/uL (ref 0.1–0.9)
MONO%: 9.2 % (ref 0.0–14.0)
NEUT#: 3.6 10*3/uL (ref 1.5–6.5)
NEUT%: 67.1 % (ref 38.4–76.8)
Platelets: 296 10*3/uL (ref 145–400)
RBC: 5.22 10*6/uL (ref 3.70–5.45)
RDW: 16.9 % — ABNORMAL HIGH (ref 11.2–14.5)
WBC: 5.4 10*3/uL (ref 3.9–10.3)
lymph#: 1.3 10*3/uL (ref 0.9–3.3)
nRBC: 0 % (ref 0–0)

## 2012-04-06 ENCOUNTER — Other Ambulatory Visit (HOSPITAL_COMMUNITY): Payer: 59

## 2012-04-06 ENCOUNTER — Other Ambulatory Visit: Payer: 59

## 2012-04-10 ENCOUNTER — Ambulatory Visit: Payer: 59 | Admitting: Internal Medicine

## 2012-04-12 ENCOUNTER — Other Ambulatory Visit (HOSPITAL_BASED_OUTPATIENT_CLINIC_OR_DEPARTMENT_OTHER): Payer: 59 | Admitting: Lab

## 2012-04-12 ENCOUNTER — Ambulatory Visit: Payer: 59

## 2012-04-12 DIAGNOSIS — C3490 Malignant neoplasm of unspecified part of unspecified bronchus or lung: Secondary | ICD-10-CM

## 2012-04-12 DIAGNOSIS — C3430 Malignant neoplasm of lower lobe, unspecified bronchus or lung: Secondary | ICD-10-CM

## 2012-04-12 LAB — CBC WITH DIFFERENTIAL/PLATELET
BASO%: 0.1 % (ref 0.0–2.0)
Basophils Absolute: 0 10*3/uL (ref 0.0–0.1)
EOS%: 0.1 % (ref 0.0–7.0)
Eosinophils Absolute: 0 10*3/uL (ref 0.0–0.5)
HCT: 39.3 % (ref 34.8–46.6)
HGB: 12.5 g/dL (ref 11.6–15.9)
LYMPH%: 16.6 % (ref 14.0–49.7)
MCH: 24.5 pg — ABNORMAL LOW (ref 25.1–34.0)
MCHC: 31.8 g/dL (ref 31.5–36.0)
MCV: 77.1 fL — ABNORMAL LOW (ref 79.5–101.0)
MONO#: 0.9 10*3/uL (ref 0.1–0.9)
MONO%: 12.9 % (ref 0.0–14.0)
NEUT#: 4.9 10*3/uL (ref 1.5–6.5)
NEUT%: 70.3 % (ref 38.4–76.8)
Platelets: 169 10*3/uL (ref 145–400)
RBC: 5.1 10*6/uL (ref 3.70–5.45)
RDW: 17.9 % — ABNORMAL HIGH (ref 11.2–14.5)
WBC: 7 10*3/uL (ref 3.9–10.3)
lymph#: 1.2 10*3/uL (ref 0.9–3.3)

## 2012-04-12 LAB — COMPREHENSIVE METABOLIC PANEL
ALT: 63 U/L — ABNORMAL HIGH (ref 0–35)
AST: 58 U/L — ABNORMAL HIGH (ref 0–37)
Albumin: 3.9 g/dL (ref 3.5–5.2)
Alkaline Phosphatase: 110 U/L (ref 39–117)
BUN: 7 mg/dL (ref 6–23)
CO2: 27 mEq/L (ref 19–32)
Calcium: 8.7 mg/dL (ref 8.4–10.5)
Chloride: 102 mEq/L (ref 96–112)
Creatinine, Ser: 0.39 mg/dL — ABNORMAL LOW (ref 0.50–1.10)
Glucose, Bld: 97 mg/dL (ref 70–99)
Potassium: 3.8 mEq/L (ref 3.5–5.3)
Sodium: 134 mEq/L — ABNORMAL LOW (ref 135–145)
Total Bilirubin: 0.3 mg/dL (ref 0.3–1.2)
Total Protein: 6.5 g/dL (ref 6.0–8.3)

## 2012-04-19 ENCOUNTER — Other Ambulatory Visit: Payer: Self-pay | Admitting: Physician Assistant

## 2012-04-19 ENCOUNTER — Other Ambulatory Visit (HOSPITAL_BASED_OUTPATIENT_CLINIC_OR_DEPARTMENT_OTHER): Payer: 59

## 2012-04-19 ENCOUNTER — Ambulatory Visit (HOSPITAL_BASED_OUTPATIENT_CLINIC_OR_DEPARTMENT_OTHER): Payer: 59 | Admitting: Physician Assistant

## 2012-04-19 ENCOUNTER — Ambulatory Visit (HOSPITAL_BASED_OUTPATIENT_CLINIC_OR_DEPARTMENT_OTHER): Payer: 59

## 2012-04-19 VITALS — BP 123/82 | HR 104 | Temp 98.1°F | Ht 60.0 in | Wt 134.5 lb

## 2012-04-19 DIAGNOSIS — C3430 Malignant neoplasm of lower lobe, unspecified bronchus or lung: Secondary | ICD-10-CM

## 2012-04-19 DIAGNOSIS — R05 Cough: Secondary | ICD-10-CM

## 2012-04-19 DIAGNOSIS — C3490 Malignant neoplasm of unspecified part of unspecified bronchus or lung: Secondary | ICD-10-CM

## 2012-04-19 DIAGNOSIS — Z5111 Encounter for antineoplastic chemotherapy: Secondary | ICD-10-CM

## 2012-04-19 DIAGNOSIS — R059 Cough, unspecified: Secondary | ICD-10-CM

## 2012-04-19 LAB — COMPREHENSIVE METABOLIC PANEL
ALT: 46 U/L — ABNORMAL HIGH (ref 0–35)
AST: 39 U/L — ABNORMAL HIGH (ref 0–37)
Albumin: 4.4 g/dL (ref 3.5–5.2)
Alkaline Phosphatase: 108 U/L (ref 39–117)
BUN: 14 mg/dL (ref 6–23)
CO2: 25 mEq/L (ref 19–32)
Calcium: 9.6 mg/dL (ref 8.4–10.5)
Chloride: 100 mEq/L (ref 96–112)
Creatinine, Ser: 0.46 mg/dL — ABNORMAL LOW (ref 0.50–1.10)
Glucose, Bld: 123 mg/dL — ABNORMAL HIGH (ref 70–99)
Potassium: 3.7 mEq/L (ref 3.5–5.3)
Sodium: 135 mEq/L (ref 135–145)
Total Bilirubin: 0.3 mg/dL (ref 0.3–1.2)
Total Protein: 7.3 g/dL (ref 6.0–8.3)

## 2012-04-19 LAB — CBC WITH DIFFERENTIAL/PLATELET
BASO%: 0.1 % (ref 0.0–2.0)
Basophils Absolute: 0 10*3/uL (ref 0.0–0.1)
EOS%: 0.1 % (ref 0.0–7.0)
Eosinophils Absolute: 0 10*3/uL (ref 0.0–0.5)
HCT: 39.2 % (ref 34.8–46.6)
HGB: 12.9 g/dL (ref 11.6–15.9)
LYMPH%: 11.2 % — ABNORMAL LOW (ref 14.0–49.7)
MCH: 25.3 pg (ref 25.1–34.0)
MCHC: 32.9 g/dL (ref 31.5–36.0)
MCV: 76.9 fL — ABNORMAL LOW (ref 79.5–101.0)
MONO#: 1 10*3/uL — ABNORMAL HIGH (ref 0.1–0.9)
MONO%: 9.1 % (ref 0.0–14.0)
NEUT#: 8.9 10*3/uL — ABNORMAL HIGH (ref 1.5–6.5)
NEUT%: 79.5 % — ABNORMAL HIGH (ref 38.4–76.8)
Platelets: 265 10*3/uL (ref 145–400)
RBC: 5.1 10*6/uL (ref 3.70–5.45)
RDW: 17.8 % — ABNORMAL HIGH (ref 11.2–14.5)
WBC: 11.1 10*3/uL — ABNORMAL HIGH (ref 3.9–10.3)
lymph#: 1.3 10*3/uL (ref 0.9–3.3)
nRBC: 0 % (ref 0–0)

## 2012-04-19 LAB — HCG, QUANTITATIVE, PREGNANCY: hCG, Beta Chain, Quant, S: <1 m[IU]/mL

## 2012-04-19 MED ORDER — ONDANSETRON 16 MG/50ML IVPB (CHCC)
16.0000 mg | Freq: Once | INTRAVENOUS | Status: AC
  Administered 2012-04-19: 16 mg via INTRAVENOUS

## 2012-04-19 MED ORDER — CYANOCOBALAMIN 1000 MCG/ML IJ SOLN
1000.0000 ug | Freq: Once | INTRAMUSCULAR | Status: AC
  Administered 2012-04-19: 1000 ug via INTRAMUSCULAR

## 2012-04-19 MED ORDER — SODIUM CHLORIDE 0.9 % IV SOLN
Freq: Once | INTRAVENOUS | Status: AC
  Administered 2012-04-19: 16:00:00 via INTRAVENOUS

## 2012-04-19 MED ORDER — SODIUM CHLORIDE 0.9 % IJ SOLN
10.0000 mL | INTRAMUSCULAR | Status: DC | PRN
  Administered 2012-04-19: 10 mL
  Filled 2012-04-19: qty 10

## 2012-04-19 MED ORDER — SODIUM CHLORIDE 0.9 % IV SOLN
600.0000 mg | Freq: Once | INTRAVENOUS | Status: AC
  Administered 2012-04-19: 600 mg via INTRAVENOUS
  Filled 2012-04-19: qty 60

## 2012-04-19 MED ORDER — DEXAMETHASONE SODIUM PHOSPHATE 4 MG/ML IJ SOLN
20.0000 mg | Freq: Once | INTRAMUSCULAR | Status: AC
  Administered 2012-04-19: 20 mg via INTRAVENOUS

## 2012-04-19 MED ORDER — HEPARIN SOD (PORK) LOCK FLUSH 100 UNIT/ML IV SOLN
500.0000 [IU] | Freq: Once | INTRAVENOUS | Status: AC | PRN
  Administered 2012-04-19: 500 [IU]
  Filled 2012-04-19: qty 5

## 2012-04-19 MED ORDER — SODIUM CHLORIDE 0.9 % IV SOLN
500.0000 mg/m2 | Freq: Once | INTRAVENOUS | Status: AC
  Administered 2012-04-19: 800 mg via INTRAVENOUS
  Filled 2012-04-19: qty 32

## 2012-04-19 MED ORDER — BENZONATATE 100 MG PO CAPS: ORAL_CAPSULE | ORAL | Status: DC

## 2012-04-20 ENCOUNTER — Telehealth: Payer: Self-pay | Admitting: Internal Medicine

## 2012-04-20 NOTE — Telephone Encounter (Signed)
called pts home s/w daughter and provided apps for june and ct scan on 06/10 @ [facility]

## 2012-04-22 NOTE — Progress Notes (Signed)
[facility] [phone]   [phone]) [phone]  OFFICE PROGRESS NOTE  PRINCIPAL DIAGNOSIS: Local recurrence of non-small cell lung cancer, adenocarcinoma, initially diagnosed as stage IB (T2a N0 M0) in March of 2010.   PRIOR THERAPY:  1. Status post right lower lobectomy under the care of Dr. Laneta Simmers on 04/08/2009. 2. Status post palliative radiotherapy to the right lower lobe recurrent lung mass under the care of Dr. Mitzi Hansen, completed on June 08, 2011.  CURRENT THERAPY: Systemic chemotherapy with carboplatin for AUC of 5 and Alimta 500 mg/M2 every 3 weeks. She is status post 2 cycles.   INTERVAL HISTORY: [name] 51 y.o. female returns to the clinic today for followup visit accompanied by a family member. The patient tolerated the first 2 cycles of her chemotherapy fairly well except for fatigue and feeling as if she has no energy. She complains of cough not controlled with Claritin. The week of chemotherapy she usually feels "sick". She is more concerned about her menstrual periods.Her last menses was March 07, 2012 and she is sexually active with unprotected intercourse. She has a history of large uterine fibroids.  She denied having any significant fever or chills. No significant weight loss or night sweats. She has no chest pain but continues to have shortness of breath with exertion.  MEDICAL HISTORY: Past Medical History  Diagnosis Date  . Lung cancer     right lower lobe adenocarcinoma  . History of radiation therapy 05/02/11 to 06/08/11    right lung    ALLERGIES:  is allergic to codeine.  MEDICATIONS:  Current Outpatient Prescriptions  Medication Sig Dispense Refill  . acetaminophen (TYLENOL) 325 MG tablet Take 650 mg by mouth every 6 (six) hours as needed. For pain      . benzonatate (TESSALON) 100 MG capsule 1 or 2 by mouth every eight hours as needed for cough  30 capsule  2  . chlorpheniramine-HYDROcodone (TUSSIONEX) 10-8 MG/5ML LQCR Take 5 mLs  by mouth every 12 (twelve) hours as needed. For cough      . dexamethasone (DECADRON) 4 MG tablet Take 4 mg by mouth 2 (two) times daily. 1 tab PO twice daily day before, day of, and day after chemotherapy      . folic acid (FOLVITE) 1 MG tablet Take 1 tablet (1 mg total) by mouth daily.  30 tablet  1  . HYDROcodone-acetaminophen (VICODIN) 5-500 MG per tablet       . ibuprofen (ADVIL,MOTRIN) 100 MG tablet Take 200 mg by mouth every 6 (six) hours as needed. For pain      . ipratropium (ATROVENT) 0.03 % nasal spray Place 1 spray into the nose 2 (two) times daily.       Marland Kitchen lidocaine-prilocaine (EMLA) cream Apply to Port-A-Cath prior to tx PRN  5 g  prn 1 year  . methylPREDNIsolone (MEDROL DOSPACK) 4 MG tablet       . Multiple Vitamin (MULTIVITAMIN) tablet Take 1 tablet by mouth daily.        . prochlorperazine (COMPAZINE) 10 MG tablet Take 10 mg by mouth every 6 (six) hours as needed. For nausea        SURGICAL HISTORY:  Past Surgical History  Procedure Date  . Lung lobectomy 04/18/2009    RLL  . Portacath placement 02/29/2012    Procedure: INSERTION PORT-A-CATH;  Surgeon: Ines Bloomer, MD;  Location: [facility] OR;  Service: Thoracic;  Laterality: Left;  PowerPort 8 F attachable in  left internal jugular.    REVIEW OF SYSTEMS:  A comprehensive review of systems was negative except for: Constitutional: positive for fatigue Respiratory: positive for cough and dyspnea on exertion Gastrointestinal: positive for nausea   PHYSICAL EXAMINATION: General appearance: alert, cooperative and no distress Head: Normocephalic, without obvious abnormality, atraumatic Neck: no adenopathy Lymph nodes: Cervical, supraclavicular, and axillary nodes normal. Resp: clear to auscultation bilaterally Back: symmetric, no curvature. ROM normal. No CVA tenderness. Cardio: regular rate and rhythm, S1, S2 normal, no murmur, click, rub or gallop GI: soft, non-tender; bowel sounds normal; no masses,  no  organomegaly Extremities: extremities normal, atraumatic, no cyanosis or edema Neurologic: Alert and oriented X 3, normal strength and tone. Normal symmetric reflexes. Normal coordination and gait  ECOG PERFORMANCE STATUS: 1 - Symptomatic but completely ambulatory  Blood pressure 123/82, pulse 104, temperature 98.1 F (36.7 C), temperature source Oral, height 5' (1.524 m), weight 134 lb 8 oz (61.009 kg).  LABORATORY DATA: Lab Results  Component Value Date   WBC 11.1* 04/19/2012   HGB 12.9 04/19/2012   HCT 39.2 04/19/2012   MCV 76.9* 04/19/2012   PLT 265 04/19/2012      Chemistry      Component Value Date/Time   NA 135 04/19/2012 1342   NA 141 01/06/2012 0804   K 3.7 04/19/2012 1342   K 4.6 01/06/2012 0804   CL 100 04/19/2012 1342   CL 98 01/06/2012 0804   CO2 25 04/19/2012 1342   CO2 28 01/06/2012 0804   BUN 14 04/19/2012 1342   BUN 10 01/06/2012 0804   CREATININE 0.46* 04/19/2012 1342   CREATININE 0.6 01/06/2012 0804      Component Value Date/Time   CALCIUM 9.6 04/19/2012 1342   CALCIUM 8.9 01/06/2012 0804   ALKPHOS 108 04/19/2012 1342   ALKPHOS 64 01/06/2012 0804   AST 39* 04/19/2012 1342   AST 27 01/06/2012 0804   ALT 46* 04/19/2012 1342   BILITOT 0.3 04/19/2012 1342   BILITOT 0.60 01/06/2012 0804       RADIOGRAPHIC STUDIES: Dg Chest 2 View Within Previous 72 Hours.  Films Obtained On Friday Are Acceptable For Monday And Tuesday Cases  02/29/2012  *RADIOLOGY REPORT*  Clinical Data: Preop surgery.  CHEST - 2 VIEW  Comparison: 01/06/2012 chest CT.  Findings: Postsurgical changes right lung with right upper lobe nodule adjacent to the suture line better detected on the recent to CT.  Right-sided pleural effusion.  Elevated right hemidiaphragm.  Mild cardiomegaly and central pulmonary vascular prominence. Tortuous aorta.  IMPRESSION: Postsurgical changes right lung with right upper lobe nodule adjacent to the suture line better detected on the recent to CT.  Right-sided pleural effusion.  Elevated right  hemidiaphragm.  Mild cardiomegaly and central pulmonary vascular prominence.  Tortuous aorta.  Original Report Authenticated By: Fuller Canada, M.D.   Dg Chest Port 1 View  02/29/2012  *RADIOLOGY REPORT*  Clinical Data: Port-A-Cath placement.  PORTABLE CHEST - 1 VIEW  Comparison: 02/29/2012 9:14 a.m.  Findings: Left power port has been placed.  The tip is at the level of the caval atrial junction.  No pneumothorax.  Mild kink proximal aspect of the catheter may represent expected findings.  Clinical correlation recommended.  Attention to this on follow-up two-view exam.  Remainder of findings without change.  IMPRESSION: Left power port has been placed.  The tip is at the level of the caval atrial junction.  No pneumothorax.  Mild kink proximal aspect of the catheter may represent  expected findings.  Clinical correlation recommended.  Attention to this on follow-up two-view exam.  Original Report Authenticated By: Fuller Canada, M.D.    ASSESSMENT/PLAN: This is a very pleasant 51 years old Asian female with a recurrent non-small cell lung cancer, adenocarcinoma. She is currently on systemic chemotherapy with carboplatin and Alimta status post 2 cycles. The patient is tolerating her treatment fairly well. The patient was discussed with Dr. Arbutus Ped. As there is a concern that the patient has missed her menses,a stat serum quantitative bHCG was drawn prior to initiating her 3rd cycle of chemotherapy The study was negative. She'll proceed with her third cycle of systemic chemotherapy with carboplatin and Alimta. She will continue with weekly labs consisting of a CBC differential and C. met. She'll  Follow up with Dr. Arbutus Ped  in 3 weeks prior to cycle #4 with a repeat CBC differential and C. Met, as well as a CT of the chest with contrast to re-evaluate her disease. To address her cough a prescription for tessalon perles was sent via [facility] to her pharmacy of record.   Laural Benes, Jahnay Lantier E, PA-C   All  questions were answered. The patient knows to call the clinic with any problems, questions or concerns. We can certainly see the patient much sooner if necessary.

## 2012-04-26 ENCOUNTER — Other Ambulatory Visit (HOSPITAL_BASED_OUTPATIENT_CLINIC_OR_DEPARTMENT_OTHER): Payer: 59 | Admitting: Lab

## 2012-04-26 DIAGNOSIS — C3490 Malignant neoplasm of unspecified part of unspecified bronchus or lung: Secondary | ICD-10-CM

## 2012-04-26 LAB — COMPREHENSIVE METABOLIC PANEL
ALT: 39 U/L — ABNORMAL HIGH (ref 0–35)
AST: 34 U/L (ref 0–37)
Albumin: 4.1 g/dL (ref 3.5–5.2)
Alkaline Phosphatase: 125 U/L — ABNORMAL HIGH (ref 39–117)
BUN: 8 mg/dL (ref 6–23)
CO2: 25 mEq/L (ref 19–32)
Calcium: 8.8 mg/dL (ref 8.4–10.5)
Chloride: 102 mEq/L (ref 96–112)
Creatinine, Ser: 0.48 mg/dL — ABNORMAL LOW (ref 0.50–1.10)
Glucose, Bld: 118 mg/dL — ABNORMAL HIGH (ref 70–99)
Potassium: 4.1 mEq/L (ref 3.5–5.3)
Sodium: 135 mEq/L (ref 135–145)
Total Bilirubin: 0.2 mg/dL — ABNORMAL LOW (ref 0.3–1.2)
Total Protein: 6.3 g/dL (ref 6.0–8.3)

## 2012-04-26 LAB — CBC WITH DIFFERENTIAL/PLATELET
BASO%: 0.2 % (ref 0.0–2.0)
Basophils Absolute: 0 10*3/uL (ref 0.0–0.1)
EOS%: 0.2 % (ref 0.0–7.0)
Eosinophils Absolute: 0 10*3/uL (ref 0.0–0.5)
HCT: 37.8 % (ref 34.8–46.6)
HGB: 12.5 g/dL (ref 11.6–15.9)
LYMPH%: 24.6 % (ref 14.0–49.7)
MCH: 25.6 pg (ref 25.1–34.0)
MCHC: 33.1 g/dL (ref 31.5–36.0)
MCV: 77.3 fL — ABNORMAL LOW (ref 79.5–101.0)
MONO#: 0.5 10*3/uL (ref 0.1–0.9)
MONO%: 11.2 % (ref 0.0–14.0)
NEUT#: 2.7 10*3/uL (ref 1.5–6.5)
NEUT%: 63.8 % (ref 38.4–76.8)
Platelets: 246 10*3/uL (ref 145–400)
RBC: 4.89 10*6/uL (ref 3.70–5.45)
RDW: 17.5 % — ABNORMAL HIGH (ref 11.2–14.5)
WBC: 4.3 10*3/uL (ref 3.9–10.3)
lymph#: 1.1 10*3/uL (ref 0.9–3.3)

## 2012-04-27 ENCOUNTER — Other Ambulatory Visit: Payer: Self-pay | Admitting: Internal Medicine

## 2012-05-03 ENCOUNTER — Ambulatory Visit: Payer: 59

## 2012-05-03 ENCOUNTER — Other Ambulatory Visit (HOSPITAL_BASED_OUTPATIENT_CLINIC_OR_DEPARTMENT_OTHER): Payer: 59 | Admitting: Lab

## 2012-05-03 DIAGNOSIS — C3490 Malignant neoplasm of unspecified part of unspecified bronchus or lung: Secondary | ICD-10-CM

## 2012-05-03 LAB — CBC WITH DIFFERENTIAL/PLATELET
BASO%: 0.1 % (ref 0.0–2.0)
Basophils Absolute: 0 10*3/uL (ref 0.0–0.1)
EOS%: 0.2 % (ref 0.0–7.0)
Eosinophils Absolute: 0 10*3/uL (ref 0.0–0.5)
HCT: 37.4 % (ref 34.8–46.6)
HGB: 12.1 g/dL (ref 11.6–15.9)
LYMPH%: 20.5 % (ref 14.0–49.7)
MCH: 25.8 pg (ref 25.1–34.0)
MCHC: 32.4 g/dL (ref 31.5–36.0)
MCV: 79.6 fL (ref 79.5–101.0)
MONO#: 0.7 10*3/uL (ref 0.1–0.9)
MONO%: 12.6 % (ref 0.0–14.0)
NEUT#: 3.6 10*3/uL (ref 1.5–6.5)
NEUT%: 66.6 % (ref 38.4–76.8)
Platelets: 188 10*3/uL (ref 145–400)
RBC: 4.69 10*6/uL (ref 3.70–5.45)
RDW: 18.9 % — ABNORMAL HIGH (ref 11.2–14.5)
WBC: 5.5 10*3/uL (ref 3.9–10.3)
lymph#: 1.1 10*3/uL (ref 0.9–3.3)
nRBC: 0 % (ref 0–0)

## 2012-05-03 LAB — COMPREHENSIVE METABOLIC PANEL
ALT: 40 U/L — ABNORMAL HIGH (ref 0–35)
AST: 39 U/L — ABNORMAL HIGH (ref 0–37)
Albumin: 4.1 g/dL (ref 3.5–5.2)
Alkaline Phosphatase: 103 U/L (ref 39–117)
BUN: 11 mg/dL (ref 6–23)
CO2: 28 mEq/L (ref 19–32)
Calcium: 9.1 mg/dL (ref 8.4–10.5)
Chloride: 102 mEq/L (ref 96–112)
Creatinine, Ser: 0.5 mg/dL (ref 0.50–1.10)
Glucose, Bld: 127 mg/dL — ABNORMAL HIGH (ref 70–99)
Potassium: 4.3 mEq/L (ref 3.5–5.3)
Sodium: 136 mEq/L (ref 135–145)
Total Bilirubin: 0.3 mg/dL (ref 0.3–1.2)
Total Protein: 6.6 g/dL (ref 6.0–8.3)

## 2012-05-07 ENCOUNTER — Ambulatory Visit (HOSPITAL_COMMUNITY)
Admission: RE | Admit: 2012-05-07 | Discharge: 2012-05-07 | Disposition: A | Discharge status: Home / Self Care | Payer: 59 | Source: Ambulatory Visit | Attending: Physician Assistant | Admitting: Physician Assistant

## 2012-05-07 ENCOUNTER — Encounter: Payer: Self-pay | Admitting: Internal Medicine

## 2012-05-07 DIAGNOSIS — C3490 Malignant neoplasm of unspecified part of unspecified bronchus or lung: Secondary | ICD-10-CM | POA: Insufficient documentation

## 2012-05-07 DIAGNOSIS — J90 Pleural effusion, not elsewhere classified: Secondary | ICD-10-CM | POA: Insufficient documentation

## 2012-05-07 MED ORDER — IOHEXOL 300 MG/ML  SOLN
80.0000 mL | Freq: Once | INTRAMUSCULAR | Status: AC | PRN
  Administered 2012-05-07: 80 mL via INTRAVENOUS

## 2012-05-07 NOTE — Progress Notes (Signed)
Put [employer] disability form on nurse's desk. °

## 2012-05-09 ENCOUNTER — Encounter: Payer: Self-pay | Admitting: Internal Medicine

## 2012-05-09 ENCOUNTER — Other Ambulatory Visit: Payer: Self-pay | Admitting: Internal Medicine

## 2012-05-09 NOTE — Progress Notes (Signed)
Faxed disability form to [employer] @ [phone] °

## 2012-05-10 ENCOUNTER — Telehealth: Payer: Self-pay | Admitting: Internal Medicine

## 2012-05-10 ENCOUNTER — Telehealth: Payer: Self-pay | Admitting: *Deleted

## 2012-05-10 ENCOUNTER — Ambulatory Visit (HOSPITAL_BASED_OUTPATIENT_CLINIC_OR_DEPARTMENT_OTHER): Payer: 59

## 2012-05-10 ENCOUNTER — Ambulatory Visit (HOSPITAL_BASED_OUTPATIENT_CLINIC_OR_DEPARTMENT_OTHER): Payer: 59 | Admitting: Physician Assistant

## 2012-05-10 ENCOUNTER — Other Ambulatory Visit: Payer: 59 | Admitting: Lab

## 2012-05-10 ENCOUNTER — Encounter: Payer: Self-pay | Admitting: Internal Medicine

## 2012-05-10 ENCOUNTER — Other Ambulatory Visit (HOSPITAL_BASED_OUTPATIENT_CLINIC_OR_DEPARTMENT_OTHER): Payer: 59 | Admitting: Lab

## 2012-05-10 VITALS — BP 136/93 | HR 105 | Temp 97.7°F | Ht 60.0 in | Wt 134.6 lb

## 2012-05-10 DIAGNOSIS — C3430 Malignant neoplasm of lower lobe, unspecified bronchus or lung: Secondary | ICD-10-CM

## 2012-05-10 DIAGNOSIS — C3490 Malignant neoplasm of unspecified part of unspecified bronchus or lung: Secondary | ICD-10-CM

## 2012-05-10 DIAGNOSIS — R110 Nausea: Secondary | ICD-10-CM

## 2012-05-10 DIAGNOSIS — Z5111 Encounter for antineoplastic chemotherapy: Secondary | ICD-10-CM

## 2012-05-10 LAB — CBC WITH DIFFERENTIAL/PLATELET
BASO%: 0.2 % (ref 0.0–2.0)
Basophils Absolute: 0 10*3/uL (ref 0.0–0.1)
EOS%: 0 % (ref 0.0–7.0)
Eosinophils Absolute: 0 10*3/uL (ref 0.0–0.5)
HCT: 40.5 % (ref 34.8–46.6)
HGB: 13.4 g/dL (ref 11.6–15.9)
LYMPH%: 9.6 % — ABNORMAL LOW (ref 14.0–49.7)
MCH: 25.8 pg (ref 25.1–34.0)
MCHC: 33.1 g/dL (ref 31.5–36.0)
MCV: 77.9 fL — ABNORMAL LOW (ref 79.5–101.0)
MONO#: 0.9 10*3/uL (ref 0.1–0.9)
MONO%: 8.6 % (ref 0.0–14.0)
NEUT#: 8.5 10*3/uL — ABNORMAL HIGH (ref 1.5–6.5)
NEUT%: 81.6 % — ABNORMAL HIGH (ref 38.4–76.8)
Platelets: 200 10*3/uL (ref 145–400)
RBC: 5.2 10*6/uL (ref 3.70–5.45)
RDW: 19.4 % — ABNORMAL HIGH (ref 11.2–14.5)
WBC: 10.4 10*3/uL — ABNORMAL HIGH (ref 3.9–10.3)
lymph#: 1 10*3/uL (ref 0.9–3.3)
nRBC: 0 % (ref 0–0)

## 2012-05-10 LAB — COMPREHENSIVE METABOLIC PANEL
ALT: 35 U/L (ref 0–35)
AST: 32 U/L (ref 0–37)
Albumin: 4.5 g/dL (ref 3.5–5.2)
Alkaline Phosphatase: 100 U/L (ref 39–117)
BUN: 12 mg/dL (ref 6–23)
CO2: 27 mEq/L (ref 19–32)
Calcium: 9.9 mg/dL (ref 8.4–10.5)
Chloride: 99 mEq/L (ref 96–112)
Creatinine, Ser: 0.55 mg/dL (ref 0.50–1.10)
Glucose, Bld: 130 mg/dL — ABNORMAL HIGH (ref 70–99)
Potassium: 4.1 mEq/L (ref 3.5–5.3)
Sodium: 135 mEq/L (ref 135–145)
Total Bilirubin: 0.4 mg/dL (ref 0.3–1.2)
Total Protein: 7.9 g/dL (ref 6.0–8.3)

## 2012-05-10 MED ORDER — HEPARIN SOD (PORK) LOCK FLUSH 100 UNIT/ML IV SOLN
500.0000 [IU] | Freq: Once | INTRAVENOUS | Status: AC | PRN
  Administered 2012-05-10: 500 [IU]
  Filled 2012-05-10: qty 5

## 2012-05-10 MED ORDER — ONDANSETRON 16 MG/50ML IVPB (CHCC)
16.0000 mg | Freq: Once | INTRAVENOUS | Status: AC
  Administered 2012-05-10: 16 mg via INTRAVENOUS

## 2012-05-10 MED ORDER — SODIUM CHLORIDE 0.9 % IV SOLN
Freq: Once | INTRAVENOUS | Status: AC
  Administered 2012-05-10: 15:00:00 via INTRAVENOUS

## 2012-05-10 MED ORDER — SODIUM CHLORIDE 0.9 % IV SOLN
600.0000 mg | Freq: Once | INTRAVENOUS | Status: AC
  Administered 2012-05-10: 600 mg via INTRAVENOUS
  Filled 2012-05-10: qty 60

## 2012-05-10 MED ORDER — DEXAMETHASONE SODIUM PHOSPHATE 4 MG/ML IJ SOLN
20.0000 mg | Freq: Once | INTRAMUSCULAR | Status: AC
  Administered 2012-05-10: 20 mg via INTRAVENOUS

## 2012-05-10 MED ORDER — SODIUM CHLORIDE 0.9 % IJ SOLN
10.0000 mL | INTRAMUSCULAR | Status: DC | PRN
  Administered 2012-05-10: 10 mL
  Filled 2012-05-10: qty 10

## 2012-05-10 MED ORDER — SODIUM CHLORIDE 0.9 % IV SOLN
500.0000 mg/m2 | Freq: Once | INTRAVENOUS | Status: AC
  Administered 2012-05-10: 800 mg via INTRAVENOUS
  Filled 2012-05-10: qty 32

## 2012-05-10 NOTE — Telephone Encounter (Signed)
appts made and will print for pt while she is here,tx to be added,mw to add     aom

## 2012-05-10 NOTE — Progress Notes (Signed)
Patient son came by today inquiring about patients disability I explain to him that the disability had been done and faxed to [place]. He was very thankful.

## 2012-05-10 NOTE — Patient Instructions (Signed)
[facility] Discharge Instructions for Patients Receiving Chemotherapy  Today you received the following chemotherapy agents Alimta/Carboplatin To help prevent nausea and vomiting after your treatment, we encourage you to take your nausea medication as prescribed.  If you develop nausea and vomiting that is not controlled by your nausea medication, call the clinic. If it is after clinic hours your family physician or the after hours number for the clinic or go to the Emergency Department.   BELOW ARE SYMPTOMS THAT SHOULD BE REPORTED IMMEDIATELY:  *FEVER GREATER THAN 100.5 F  *CHILLS WITH OR WITHOUT FEVER  NAUSEA AND VOMITING THAT IS NOT CONTROLLED WITH YOUR NAUSEA MEDICATION  *UNUSUAL SHORTNESS OF BREATH  *UNUSUAL BRUISING OR BLEEDING  TENDERNESS IN MOUTH AND THROAT WITH OR WITHOUT PRESENCE OF ULCERS  *URINARY PROBLEMS  *BOWEL PROBLEMS  UNUSUAL RASH Items with * indicate a potential emergency and should be followed up as soon as possible.  One of the nurses will contact you 24 hours after your treatment. Please let the nurse know about any problems that you may have experienced. Feel free to call the clinic you have any questions or concerns. The clinic phone number is [phone].   I have been informed and understand all the instructions given to me. I know to contact the clinic, my physician, or go to the Emergency Department if any problems should occur. I do not have any questions at this time, but understand that I may call the clinic during office hours   should I have any questions or need assistance in obtaining follow up care.    __________________________________________  _____________  __________ Signature of Patient or Authorized Representative            Date                   Time    __________________________________________ Nurse's Signature    

## 2012-05-10 NOTE — Telephone Encounter (Signed)
Per staff message I have scheduled appts. JMW  

## 2012-05-14 NOTE — Progress Notes (Signed)
[facility] Telephone:(336) [phone]   Fax:(336) [phone]  OFFICE PROGRESS NOTE  PRINCIPAL DIAGNOSIS: Local recurrence of non-small cell lung cancer, adenocarcinoma, initially diagnosed as stage IB (T2a N0 M0) in March of 2010.   PRIOR THERAPY:  1. Status post right lower lobectomy under the care of Dr. Laneta Simmers on 04/08/2009. 2. Status post palliative radiotherapy to the right lower lobe recurrent lung mass under the care of Dr. Mitzi Hansen, completed on June 08, 2011.  CURRENT THERAPY: Systemic chemotherapy with carboplatin for AUC of 5 and Alimta 500 mg/M2 every 3 weeks. She is status post 3 cycles.   INTERVAL HISTORY: [name] 51 y.o. female returns to the clinic today for followup visit accompanied by her son. Overall she's tolerating her systemic chemotherapy with carboplatin and Alimta relatively well. She continues to have some mild nausea but is only taking her antiemetic once a day. She occasionally complains of her urine feeling hot but this is relieved by taking Tylenol. She occasionally has mild diarrhea which is self-limiting but denied any constipation. She had a restaging CT scan of the chest on 05/07/2012 and is here to discuss the results as well as proceed with her next cycle of chemotherapy. She has a history of large uterine fibroids.  She denied having any significant fever or chills. No significant weight loss or night sweats. She has no chest pain but continues to have shortness of breath with exertion.  MEDICAL HISTORY: Past Medical History  Diagnosis Date  . Lung cancer     right lower lobe adenocarcinoma  . History of radiation therapy 05/02/11 to 06/08/11    right lung    ALLERGIES:  is allergic to codeine.  MEDICATIONS:  Current Outpatient Prescriptions  Medication Sig Dispense Refill  . acetaminophen (TYLENOL) 325 MG tablet Take 650 mg by mouth every 6 (six) hours as needed. For pain      . benzonatate (TESSALON) 100 MG capsule 1 or 2 by mouth  every eight hours as needed for cough  30 capsule  2  . chlorpheniramine-HYDROcodone (TUSSIONEX) 10-8 MG/5ML LQCR Take 5 mLs by mouth every 12 (twelve) hours as needed. For cough      . dexamethasone (DECADRON) 4 MG tablet Take 4 mg by mouth 2 (two) times daily. 1 tab PO twice daily day before, day of, and day after chemotherapy      . folic acid (FOLVITE) 1 MG tablet TAKE 1 TABLET EVERY DAY  30 tablet  1  . HYDROcodone-acetaminophen (VICODIN) 5-500 MG per tablet       . ibuprofen (ADVIL,MOTRIN) 100 MG tablet Take 200 mg by mouth every 6 (six) hours as needed. For pain      . ipratropium (ATROVENT) 0.03 % nasal spray Place 1 spray into the nose 2 (two) times daily.       Marland Kitchen lidocaine-prilocaine (EMLA) cream Apply to Port-A-Cath prior to tx PRN  5 g  prn 1 year  . methylPREDNIsolone (MEDROL DOSPACK) 4 MG tablet       . Multiple Vitamin (MULTIVITAMIN) tablet Take 1 tablet by mouth daily.        . prochlorperazine (COMPAZINE) 10 MG tablet Take 10 mg by mouth every 6 (six) hours as needed. For nausea        SURGICAL HISTORY:  Past Surgical History  Procedure Date  . Lung lobectomy 04/18/2009    RLL  . Portacath placement 02/29/2012    Procedure: INSERTION PORT-A-CATH;  Surgeon: Ines Bloomer,  MD;  Location: [facility] OR;  Service: Thoracic;  Laterality: Left;  PowerPort 8 F attachable in left internal jugular.    REVIEW OF SYSTEMS:  A comprehensive review of systems was negative except for: Constitutional: positive for fatigue Respiratory: positive for cough and dyspnea on exertion Gastrointestinal: positive for nausea   PHYSICAL EXAMINATION: General appearance: alert, cooperative and no distress Head: Normocephalic, without obvious abnormality, atraumatic Neck: no adenopathy Lymph nodes: Cervical, supraclavicular, and axillary nodes normal. Resp: clear to auscultation bilaterally Back: symmetric, no curvature. ROM normal. No CVA tenderness. Cardio: regular rate and rhythm, S1, S2 normal, no  murmur, click, rub or gallop GI: abnormal findings:  Lower abdominal firmness consistent with the patient's history of uterine fibroids, remainder of the abdominal exam is benign Extremities: extremities normal, atraumatic, no cyanosis or edema Neurologic: Alert and oriented X 3, normal strength and tone. Normal symmetric reflexes. Normal coordination and gait  ECOG PERFORMANCE STATUS: 1 - Symptomatic but completely ambulatory  Blood pressure 136/93, pulse 105, temperature 97.7 F (36.5 C), temperature source Oral, height 5' (1.524 m), weight 134 lb 9.6 oz (61.054 kg).  LABORATORY DATA: Lab Results  Component Value Date   WBC 10.4* 05/10/2012   HGB 13.4 05/10/2012   HCT 40.5 05/10/2012   MCV 77.9* 05/10/2012   PLT 200 05/10/2012      Chemistry      Component Value Date/Time   NA 135 05/10/2012 1418   NA 141 01/06/2012 0804   K 4.1 05/10/2012 1418   K 4.6 01/06/2012 0804   CL 99 05/10/2012 1418   CL 98 01/06/2012 0804   CO2 27 05/10/2012 1418   CO2 28 01/06/2012 0804   BUN 12 05/10/2012 1418   BUN 10 01/06/2012 0804   CREATININE 0.55 05/10/2012 1418   CREATININE 0.6 01/06/2012 0804      Component Value Date/Time   CALCIUM 9.9 05/10/2012 1418   CALCIUM 8.9 01/06/2012 0804   ALKPHOS 100 05/10/2012 1418   ALKPHOS 64 01/06/2012 0804   AST 32 05/10/2012 1418   AST 27 01/06/2012 0804   ALT 35 05/10/2012 1418   BILITOT 0.4 05/10/2012 1418   BILITOT 0.60 01/06/2012 0804       RADIOGRAPHIC STUDIES: Dg Chest 2 View Within Previous 72 Hours.  Films Obtained On Friday Are Acceptable For Monday And Tuesday Cases  02/29/2012  *RADIOLOGY REPORT*  Clinical Data: Preop surgery.  CHEST - 2 VIEW  Comparison: 01/06/2012 chest CT.  Findings: Postsurgical changes right lung with right upper lobe nodule adjacent to the suture line better detected on the recent to CT.  Right-sided pleural effusion.  Elevated right hemidiaphragm.  Mild cardiomegaly and central pulmonary vascular prominence. Tortuous aorta.  IMPRESSION:  Postsurgical changes right lung with right upper lobe nodule adjacent to the suture line better detected on the recent to CT.  Right-sided pleural effusion.  Elevated right hemidiaphragm.  Mild cardiomegaly and central pulmonary vascular prominence.  Tortuous aorta.  Original Report Authenticated By: Fuller Canada, M.D.   Dg Chest Port 1 View  02/29/2012  *RADIOLOGY REPORT*  Clinical Data: Port-A-Cath placement.  PORTABLE CHEST - 1 VIEW  Comparison: 02/29/2012 9:14 a.m.  Findings: Left power port has been placed.  The tip is at the level of the caval atrial junction.  No pneumothorax.  Mild kink proximal aspect of the catheter may represent expected findings.  Clinical correlation recommended.  Attention to this on follow-up two-view exam.  Remainder of findings without change.  IMPRESSION: Left power port  has been placed.  The tip is at the level of the caval atrial junction.  No pneumothorax.  Mild kink proximal aspect of the catheter may represent expected findings.  Clinical correlation recommended.  Attention to this on follow-up two-view exam.  Original Report Authenticated By: Fuller Canada, M.D.   Ct Chest W Contrast  05/07/2012  *RADIOLOGY REPORT*  Clinical Data: Field  CT CHEST WITH CONTRAST  Technique:  Multidetector CT imaging of the chest was performed following the standard protocol during bolus administration of intravenous contrast.  Contrast: 80mL OMNIPAQUE IOHEXOL 300 MG/ML  SOLN  Comparison: chest CT from 01/06/2012.  PET CT from 01/16/2012.  Findings: The tip of the left-sided Port-A-Cath projects at the level of the mid right atrium.  There is no axillary lymphadenopathy.  The postsurgical and post-treatment changes in the right hilum and perihilar region are stable.  The soft tissue opacity in the right hilum is stable.  No change in the small right pleural effusion.  The cardiopericardial silhouette is within normal limits for size. Volume loss in the right hemithorax is unchanged.   There is no pericardial or pleural effusion.  1.3 x 2.1 cm posteromedial right upper lobe pulmonary nodule seen on the previous study has decreased in size in the interval with now only a cluster of tiny nodules visible in the same region. This is seen on image 13 of today's study represents the region of most hypermetabolic disease on the previous PET CT.  Left lung is clear.  Bone windows reveal no worrisome lytic or sclerotic osseous lesions.  IMPRESSION: The hypermetabolic posteromedial right upper lung nodule seen on the previous PET CT has decreased in the interval.  Stable appearance of right hilar and perihilar soft tissue attenuation, likely reflecting a combination of treatment and surgery.  Original Report Authenticated By: ERIC A. MANSELL, M.D.    ASSESSMENT/PLAN: This is a very pleasant 51 years old Asian female with a recurrent non-small cell lung cancer, adenocarcinoma. She is currently on systemic chemotherapy with carboplatin and Alimta status post 3 cycles. The patient is tolerating her treatment fairly well. The patient was discussed with Dr. Truett Perna in Dr. Asa Lente absence. The results of the patient's recent CT scan were discussed with her and her son. The previously hypermetabolic posterior medial right upper lobe nodule seen on the previous PET/CT has decreased in the interval and the remaining right hilar and perihilar soft tissue attenuation is stable which likely reflects a combination of treatment and surgery. She'll proceed with cycle #4 of her systemic chemotherapy with carboplatin and Alimta. She will continue with weekly labs consisting of a CBC differential and C. met and return in 3 weeks prior to cycle #5 with repeat CBC differential and C. met.   Laural Benes, Crisanto Nied E, PA-C   All questions were answered. The patient knows to call the clinic with any problems, questions or concerns. We can certainly see the patient much sooner if necessary.

## 2012-05-17 ENCOUNTER — Other Ambulatory Visit (HOSPITAL_BASED_OUTPATIENT_CLINIC_OR_DEPARTMENT_OTHER): Payer: 59 | Admitting: Lab

## 2012-05-17 DIAGNOSIS — C3490 Malignant neoplasm of unspecified part of unspecified bronchus or lung: Secondary | ICD-10-CM

## 2012-05-17 DIAGNOSIS — C3430 Malignant neoplasm of lower lobe, unspecified bronchus or lung: Secondary | ICD-10-CM

## 2012-05-17 LAB — CBC WITH DIFFERENTIAL/PLATELET
BASO%: 0.3 % (ref 0.0–2.0)
Basophils Absolute: 0 10*3/uL (ref 0.0–0.1)
EOS%: 0.2 % (ref 0.0–7.0)
Eosinophils Absolute: 0 10*3/uL (ref 0.0–0.5)
HCT: 35.9 % (ref 34.8–46.6)
HGB: 11.9 g/dL (ref 11.6–15.9)
LYMPH%: 20.3 % (ref 14.0–49.7)
MCH: 26.4 pg (ref 25.1–34.0)
MCHC: 33.1 g/dL (ref 31.5–36.0)
MCV: 79.9 fL (ref 79.5–101.0)
MONO#: 0.5 10*3/uL (ref 0.1–0.9)
MONO%: 12.4 % (ref 0.0–14.0)
NEUT#: 2.9 10*3/uL (ref 1.5–6.5)
NEUT%: 66.8 % (ref 38.4–76.8)
Platelets: 224 10*3/uL (ref 145–400)
RBC: 4.5 10*6/uL (ref 3.70–5.45)
RDW: 20.1 % — ABNORMAL HIGH (ref 11.2–14.5)
WBC: 4.4 10*3/uL (ref 3.9–10.3)
lymph#: 0.9 10*3/uL (ref 0.9–3.3)

## 2012-05-17 LAB — COMPREHENSIVE METABOLIC PANEL
ALT: 46 U/L — ABNORMAL HIGH (ref 0–35)
AST: 44 U/L — ABNORMAL HIGH (ref 0–37)
Albumin: 3.9 g/dL (ref 3.5–5.2)
Alkaline Phosphatase: 87 U/L (ref 39–117)
BUN: 8 mg/dL (ref 6–23)
CO2: 25 mEq/L (ref 19–32)
Calcium: 8.6 mg/dL (ref 8.4–10.5)
Chloride: 103 mEq/L (ref 96–112)
Creatinine, Ser: 0.47 mg/dL — ABNORMAL LOW (ref 0.50–1.10)
Glucose, Bld: 110 mg/dL — ABNORMAL HIGH (ref 70–99)
Potassium: 4 mEq/L (ref 3.5–5.3)
Sodium: 138 mEq/L (ref 135–145)
Total Bilirubin: 0.2 mg/dL — ABNORMAL LOW (ref 0.3–1.2)
Total Protein: 6.2 g/dL (ref 6.0–8.3)

## 2012-05-24 ENCOUNTER — Other Ambulatory Visit: Payer: 59 | Admitting: Lab

## 2012-05-24 DIAGNOSIS — C3490 Malignant neoplasm of unspecified part of unspecified bronchus or lung: Secondary | ICD-10-CM

## 2012-05-24 LAB — CBC WITH DIFFERENTIAL/PLATELET
BASO%: 0.2 % (ref 0.0–2.0)
Basophils Absolute: 0 10*3/uL (ref 0.0–0.1)
EOS%: 0.2 % (ref 0.0–7.0)
Eosinophils Absolute: 0 10*3/uL (ref 0.0–0.5)
HCT: 39.6 % (ref 34.8–46.6)
HGB: 13.1 g/dL (ref 11.6–15.9)
LYMPH%: 15.1 % (ref 14.0–49.7)
MCH: 26.8 pg (ref 25.1–34.0)
MCHC: 33.2 g/dL (ref 31.5–36.0)
MCV: 80.6 fL (ref 79.5–101.0)
MONO#: 1 10*3/uL — ABNORMAL HIGH (ref 0.1–0.9)
MONO%: 13.4 % (ref 0.0–14.0)
NEUT#: 5.5 10*3/uL (ref 1.5–6.5)
NEUT%: 71.1 % (ref 38.4–76.8)
Platelets: 177 10*3/uL (ref 145–400)
RBC: 4.91 10*6/uL (ref 3.70–5.45)
RDW: 21.6 % — ABNORMAL HIGH (ref 11.2–14.5)
WBC: 7.7 10*3/uL (ref 3.9–10.3)
lymph#: 1.2 10*3/uL (ref 0.9–3.3)

## 2012-05-24 LAB — COMPREHENSIVE METABOLIC PANEL
ALT: 48 U/L — ABNORMAL HIGH (ref 0–35)
AST: 46 U/L — ABNORMAL HIGH (ref 0–37)
Albumin: 4.3 g/dL (ref 3.5–5.2)
Alkaline Phosphatase: 85 U/L (ref 39–117)
BUN: 9 mg/dL (ref 6–23)
CO2: 26 mEq/L (ref 19–32)
Calcium: 8.8 mg/dL (ref 8.4–10.5)
Chloride: 101 mEq/L (ref 96–112)
Creatinine, Ser: 0.55 mg/dL (ref 0.50–1.10)
Glucose, Bld: 88 mg/dL (ref 70–99)
Potassium: 4.5 mEq/L (ref 3.5–5.3)
Sodium: 136 mEq/L (ref 135–145)
Total Bilirubin: 0.4 mg/dL (ref 0.3–1.2)
Total Protein: 7 g/dL (ref 6.0–8.3)

## 2012-06-01 ENCOUNTER — Encounter: Payer: Self-pay | Admitting: *Deleted

## 2012-06-01 ENCOUNTER — Ambulatory Visit (HOSPITAL_BASED_OUTPATIENT_CLINIC_OR_DEPARTMENT_OTHER): Payer: 59 | Admitting: Physician Assistant

## 2012-06-01 ENCOUNTER — Other Ambulatory Visit: Payer: Self-pay | Admitting: Certified Registered Nurse Anesthetist

## 2012-06-01 ENCOUNTER — Telehealth: Payer: Self-pay | Admitting: Internal Medicine

## 2012-06-01 ENCOUNTER — Ambulatory Visit (HOSPITAL_BASED_OUTPATIENT_CLINIC_OR_DEPARTMENT_OTHER): Payer: 59

## 2012-06-01 ENCOUNTER — Other Ambulatory Visit (HOSPITAL_BASED_OUTPATIENT_CLINIC_OR_DEPARTMENT_OTHER): Payer: 59 | Admitting: Lab

## 2012-06-01 VITALS — BP 133/91 | HR 92 | Temp 98.4°F | Ht 60.0 in | Wt 133.4 lb

## 2012-06-01 DIAGNOSIS — Z5111 Encounter for antineoplastic chemotherapy: Secondary | ICD-10-CM

## 2012-06-01 DIAGNOSIS — C3430 Malignant neoplasm of lower lobe, unspecified bronchus or lung: Secondary | ICD-10-CM

## 2012-06-01 DIAGNOSIS — C3490 Malignant neoplasm of unspecified part of unspecified bronchus or lung: Secondary | ICD-10-CM

## 2012-06-01 LAB — CBC WITH DIFFERENTIAL/PLATELET
BASO%: 0 % (ref 0.0–2.0)
Basophils Absolute: 0 10*3/uL (ref 0.0–0.1)
EOS%: 0 % (ref 0.0–7.0)
Eosinophils Absolute: 0 10*3/uL (ref 0.0–0.5)
HCT: 40.3 % (ref 34.8–46.6)
HGB: 13.5 g/dL (ref 11.6–15.9)
LYMPH%: 12.7 % — ABNORMAL LOW (ref 14.0–49.7)
MCH: 26.8 pg (ref 25.1–34.0)
MCHC: 33.5 g/dL (ref 31.5–36.0)
MCV: 80.1 fL (ref 79.5–101.0)
MONO#: 0.6 10*3/uL (ref 0.1–0.9)
MONO%: 7.6 % (ref 0.0–14.0)
NEUT#: 6.6 10*3/uL — ABNORMAL HIGH (ref 1.5–6.5)
NEUT%: 79.7 % — ABNORMAL HIGH (ref 38.4–76.8)
Platelets: 252 10*3/uL (ref 145–400)
RBC: 5.03 10*6/uL (ref 3.70–5.45)
RDW: 19.7 % — ABNORMAL HIGH (ref 11.2–14.5)
WBC: 8.2 10*3/uL (ref 3.9–10.3)
lymph#: 1 10*3/uL (ref 0.9–3.3)

## 2012-06-01 LAB — COMPREHENSIVE METABOLIC PANEL
ALT: 37 U/L — ABNORMAL HIGH (ref 0–35)
AST: 34 U/L (ref 0–37)
Albumin: 4 g/dL (ref 3.5–5.2)
Alkaline Phosphatase: 97 U/L (ref 39–117)
BUN: 8 mg/dL (ref 6–23)
CO2: 24 mEq/L (ref 19–32)
Calcium: 9.2 mg/dL (ref 8.4–10.5)
Chloride: 98 mEq/L (ref 96–112)
Creatinine, Ser: 0.41 mg/dL — ABNORMAL LOW (ref 0.50–1.10)
Glucose, Bld: 195 mg/dL — ABNORMAL HIGH (ref 70–99)
Potassium: 3.7 mEq/L (ref 3.5–5.3)
Sodium: 134 mEq/L — ABNORMAL LOW (ref 135–145)
Total Bilirubin: 0.2 mg/dL — ABNORMAL LOW (ref 0.3–1.2)
Total Protein: 8 g/dL (ref 6.0–8.3)

## 2012-06-01 MED ORDER — HEPARIN SOD (PORK) LOCK FLUSH 100 UNIT/ML IV SOLN
500.0000 [IU] | Freq: Once | INTRAVENOUS | Status: AC | PRN
  Administered 2012-06-01: 500 [IU]
  Filled 2012-06-01: qty 5

## 2012-06-01 MED ORDER — SODIUM CHLORIDE 0.9 % IJ SOLN
10.0000 mL | INTRAMUSCULAR | Status: DC | PRN
  Administered 2012-06-01: 10 mL
  Filled 2012-06-01: qty 10

## 2012-06-01 MED ORDER — ONDANSETRON 16 MG/50ML IVPB (CHCC)
16.0000 mg | Freq: Once | INTRAVENOUS | Status: AC
  Administered 2012-06-01: 16 mg via INTRAVENOUS

## 2012-06-01 MED ORDER — DEXAMETHASONE SODIUM PHOSPHATE 4 MG/ML IJ SOLN
20.0000 mg | Freq: Once | INTRAMUSCULAR | Status: AC
  Administered 2012-06-01: 20 mg via INTRAVENOUS

## 2012-06-01 MED ORDER — SODIUM CHLORIDE 0.9 % IV SOLN
Freq: Once | INTRAVENOUS | Status: AC
  Administered 2012-06-01: 16:00:00 via INTRAVENOUS

## 2012-06-01 MED ORDER — SODIUM CHLORIDE 0.9 % IV SOLN
600.0000 mg | Freq: Once | INTRAVENOUS | Status: AC
  Administered 2012-06-01: 600 mg via INTRAVENOUS
  Filled 2012-06-01: qty 60

## 2012-06-01 MED ORDER — SODIUM CHLORIDE 0.9 % IV SOLN
500.0000 mg/m2 | Freq: Once | INTRAVENOUS | Status: AC
  Administered 2012-06-01: 800 mg via INTRAVENOUS
  Filled 2012-06-01: qty 32

## 2012-06-01 NOTE — Patient Instructions (Addendum)
[facility] Discharge Instructions for Patients Receiving Chemotherapy  Today you received the following chemotherapy agents Carboplatin and Alimta.  To help prevent nausea and vomiting after your treatment, we encourage you to take your nausea medication.  If you develop nausea and vomiting that is not controlled by your nausea medication, call the clinic. If it is after clinic hours your family physician or the after hours number for the clinic or go to the Emergency Department.   BELOW ARE SYMPTOMS THAT SHOULD BE REPORTED IMMEDIATELY:  *FEVER GREATER THAN 100.5 F  *CHILLS WITH OR WITHOUT FEVER  NAUSEA AND VOMITING THAT IS NOT CONTROLLED WITH YOUR NAUSEA MEDICATION  *UNUSUAL SHORTNESS OF BREATH  *UNUSUAL BRUISING OR BLEEDING  TENDERNESS IN MOUTH AND THROAT WITH OR WITHOUT PRESENCE OF ULCERS  *URINARY PROBLEMS  *BOWEL PROBLEMS  UNUSUAL RASH Items with * indicate a potential emergency and should be followed up as soon as possible.  One of the nurses will contact you 24 hours after your treatment. Please let the nurse know about any problems that you may have experienced. Feel free to call the clinic you have any questions or concerns. The clinic phone number is [phone].   I have been informed and understand all the instructions given to me. I know to contact the clinic, my physician, or go to the Emergency Department if any problems should occur. I do not have any questions at this time, but understand that I may call the clinic during office hours   should I have any questions or need assistance in obtaining follow up care.    __________________________________________  _____________  __________ Signature of Patient or Authorized Representative            Date                   Time    __________________________________________ Nurse's Signature    

## 2012-06-01 NOTE — Progress Notes (Signed)
Spoke with pt and family at [facility] today.  No questions at this time

## 2012-06-01 NOTE — Telephone Encounter (Signed)
appts made and printed for pt aom °

## 2012-06-05 NOTE — Progress Notes (Signed)
[facility] Telephone:([phone]   [phone]) [phone]  OFFICE PROGRESS NOTE  PRINCIPAL DIAGNOSIS: Local recurrence of non-small cell lung cancer, adenocarcinoma, initially diagnosed as stage IB (T2a N0 M0) in March of 2010.   PRIOR THERAPY:  1. Status post right lower lobectomy under the care of Dr. Laneta Simmers on 04/08/2009. 2. Status post palliative radiotherapy to the right lower lobe recurrent lung mass under the care of Dr. Mitzi Hansen, completed on June 08, 2011.  CURRENT THERAPY: Systemic chemotherapy with carboplatin for AUC of 5 and Alimta 500 mg/M2 every 3 weeks. She is status post 3 cycles.   INTERVAL HISTORY: [name] 51 y.o. female returns to the clinic today for followup visit accompanied by her son and her husband. She continues to tolerate her systemic chemotherapy with carboplatin and Alimta relatively well. She did have a bit more nausea after the last cycle but it is well-controlled with her current antiemetic. She'll set some mild diarrhea that has since resolved. She does not require any prescription refills and has had no changes to her daily medications.  She reports that she is [place] a trip to [place] beginning 06/30/2012 through approximately 07/29/2012. This will be after she completes her sixth cycle of chemotherapy that is currently scheduled for 06/21/2012. This will disrupt the timing of her followup restaging CT scan. She has a history of large uterine fibroids.  She denied having any significant fever or chills. No significant weight loss or night sweats. She has no chest pain but continues to have shortness of breath with exertion.  MEDICAL HISTORY: Past Medical History  Diagnosis Date  . Lung cancer     right lower lobe adenocarcinoma  . History of radiation therapy 05/02/11 to 06/08/11    right lung    ALLERGIES:  is allergic to codeine.  MEDICATIONS:  Current Outpatient Prescriptions  Medication Sig Dispense Refill  .  acetaminophen (TYLENOL) 325 MG tablet Take 650 mg by mouth every 6 (six) hours as needed. For pain      . benzonatate (TESSALON) 100 MG capsule 1 or 2 by mouth every eight hours as needed for cough  30 capsule  2  . chlorpheniramine-HYDROcodone (TUSSIONEX) 10-8 MG/5ML LQCR Take 5 mLs by mouth every 12 (twelve) hours as needed. For cough      . dexamethasone (DECADRON) 4 MG tablet Take 4 mg by mouth 2 (two) times daily. 1 tab PO twice daily day before, day of, and day after chemotherapy      . folic acid (FOLVITE) 1 MG tablet TAKE 1 TABLET EVERY DAY  30 tablet  1  . HYDROcodone-acetaminophen (VICODIN) 5-500 MG per tablet       . ibuprofen (ADVIL,MOTRIN) 100 MG tablet Take 200 mg by mouth every 6 (six) hours as needed. For pain      . ipratropium (ATROVENT) 0.03 % nasal spray Place 1 spray into the nose 2 (two) times daily.       Marland Kitchen lidocaine-prilocaine (EMLA) cream Apply to Port-A-Cath prior to tx PRN  5 g  prn 1 year  . methylPREDNIsolone (MEDROL DOSPACK) 4 MG tablet       . Multiple Vitamin (MULTIVITAMIN) tablet Take 1 tablet by mouth daily.        . prochlorperazine (COMPAZINE) 10 MG tablet Take 10 mg by mouth every 6 (six) hours as needed. For nausea        SURGICAL HISTORY:  Past Surgical History  Procedure Date  .  Lung lobectomy 04/18/2009    RLL  . Portacath placement 02/29/2012    Procedure: INSERTION PORT-A-CATH;  Surgeon: Ines Bloomer, MD;  Location: [facility];  Service: Thoracic;  Laterality: Left;  PowerPort 8 F attachable in left internal jugular.    REVIEW OF SYSTEMS:  Pertinent items are noted in HPI.   PHYSICAL EXAMINATION: General appearance: alert, cooperative and no distress Head: Normocephalic, without obvious abnormality, atraumatic Neck: no adenopathy Lymph nodes: Cervical, supraclavicular, and axillary nodes normal. Resp: clear to auscultation bilaterally Back: symmetric, no curvature. ROM normal. No CVA tenderness. Cardio: regular rate and rhythm, S1, S2 normal, no  murmur, click, rub or gallop GI: abnormal findings:  Lower abdominal firmness consistent with the patient's history of uterine fibroids, remainder of the abdominal exam is benign Extremities: extremities normal, atraumatic, no cyanosis or edema Neurologic: Alert and oriented X 3, normal strength and tone. Normal symmetric reflexes. Normal coordination and gait  ECOG PERFORMANCE STATUS: 1 - Symptomatic but completely ambulatory  Blood pressure 133/91, pulse 92, temperature 98.4 F (36.9 C), temperature source Oral, height 5' (1.524 m), weight 133 lb 6.4 oz (60.51 kg).  LABORATORY DATA: Lab Results  Component Value Date   WBC 8.2 06/01/2012   HGB 13.5 06/01/2012   HCT 40.3 06/01/2012   MCV 80.1 06/01/2012   PLT 252 06/01/2012      Chemistry      Component Value Date/Time   NA 134* 06/01/2012 1407   NA 141 01/06/2012 0804   K 3.7 06/01/2012 1407   K 4.6 01/06/2012 0804   CL 98 06/01/2012 1407   CL 98 01/06/2012 0804   CO2 24 06/01/2012 1407   CO2 28 01/06/2012 0804   BUN 8 06/01/2012 1407   BUN 10 01/06/2012 0804   CREATININE 0.41* 06/01/2012 1407   CREATININE 0.6 01/06/2012 0804      Component Value Date/Time   CALCIUM 9.2 06/01/2012 1407   CALCIUM 8.9 01/06/2012 0804   ALKPHOS 97 06/01/2012 1407   ALKPHOS 64 01/06/2012 0804   AST 34 06/01/2012 1407   AST 27 01/06/2012 0804   ALT 37* 06/01/2012 1407   BILITOT 0.2* 06/01/2012 1407   BILITOT 0.60 01/06/2012 0804       RADIOGRAPHIC STUDIES: Dg Chest 2 View Within Previous 72 Hours.  Films Obtained On Friday Are Acceptable For Monday And Tuesday Cases  02/29/2012  *RADIOLOGY REPORT*  Clinical Data: Preop surgery.  CHEST - 2 VIEW  Comparison: 01/06/2012 chest CT.  Findings: Postsurgical changes right lung with right upper lobe nodule adjacent to the suture line better detected on the recent to CT.  Right-sided pleural effusion.  Elevated right hemidiaphragm.  Mild cardiomegaly and central pulmonary vascular prominence. Tortuous aorta.  IMPRESSION: Postsurgical changes right  lung with right upper lobe nodule adjacent to the suture line better detected on the recent to CT.  Right-sided pleural effusion.  Elevated right hemidiaphragm.  Mild cardiomegaly and central pulmonary vascular prominence.  Tortuous aorta.  Original Report Authenticated By: Fuller Canada, M.D.   Dg Chest Port 1 View  02/29/2012  *RADIOLOGY REPORT*  Clinical Data: Port-A-Cath placement.  PORTABLE CHEST - 1 VIEW  Comparison: 02/29/2012 9:14 a.m.  Findings: Left power port has been placed.  The tip is at the level of the caval atrial junction.  No pneumothorax.  Mild kink proximal aspect of the catheter may represent expected findings.  Clinical correlation recommended.  Attention to this on follow-up two-view exam.  Remainder of findings without change.  IMPRESSION: Left power port has been placed.  The tip is at the level of the caval atrial junction.  No pneumothorax.  Mild kink proximal aspect of the catheter may represent expected findings.  Clinical correlation recommended.  Attention to this on follow-up two-view exam.  Original Report Authenticated By: Fuller Canada, M.D.   Ct Chest W Contrast  05/07/2012  *RADIOLOGY REPORT*  Clinical Data: Field  CT CHEST WITH CONTRAST  Technique:  Multidetector CT imaging of the chest was performed following the standard protocol during bolus administration of intravenous contrast.  Contrast: 80mL OMNIPAQUE IOHEXOL 300 MG/ML  SOLN  Comparison: chest CT from 01/06/2012.  PET CT from 01/16/2012.  Findings: The tip of the left-sided Port-A-Cath projects at the level of the mid right atrium.  There is no axillary lymphadenopathy.  The postsurgical and post-treatment changes in the right hilum and perihilar region are stable.  The soft tissue opacity in the right hilum is stable.  No change in the small right pleural effusion.  The cardiopericardial silhouette is within normal limits for size. Volume loss in the right hemithorax is unchanged.  There is no pericardial or  pleural effusion.  1.3 x 2.1 cm posteromedial right upper lobe pulmonary nodule seen on the previous study has decreased in size in the interval with now only a cluster of tiny nodules visible in the same region. This is seen on image 13 of today's study represents the region of most hypermetabolic disease on the previous PET CT.  Left lung is clear.  Bone windows reveal no worrisome lytic or sclerotic osseous lesions.  IMPRESSION: The hypermetabolic posteromedial right upper lung nodule seen on the previous PET CT has decreased in the interval.  Stable appearance of right hilar and perihilar soft tissue attenuation, likely reflecting a combination of treatment and surgery.  Original Report Authenticated By: ERIC A. MANSELL, M.D.    ASSESSMENT/PLAN: This is a very pleasant 51 years old Asian female with a recurrent non-small cell lung cancer, adenocarcinoma. She is currently on systemic chemotherapy with carboplatin and Alimta status post 3 cycles. The patient is tolerating her treatment fairly well. The patient was discussed with Dr. Darrold Span in Dr. Asa Lente absence. She'll proceed with cycle #5 of her systemic chemotherapy with carboplatin and Alimta. She will continue with weekly labs consisting of a CBC differential and C. met and return in 3 weeks prior to cycle #6 with repeat CBC differential and C. met. He'll discuss the patient's planned trip last rate is with Dr. Arbutus Ped and contact her son Ra at his cell number [phone] on alternate number [phone] regarding his decision  Conni Slipper, PA-C   All questions were answered. The patient knows to call the clinic with any problems, questions or concerns. We can certainly see the patient much sooner if necessary.

## 2012-06-07 ENCOUNTER — Encounter: Payer: Self-pay | Admitting: Internal Medicine

## 2012-06-07 ENCOUNTER — Other Ambulatory Visit (HOSPITAL_BASED_OUTPATIENT_CLINIC_OR_DEPARTMENT_OTHER): Payer: 59 | Admitting: Lab

## 2012-06-07 DIAGNOSIS — C3490 Malignant neoplasm of unspecified part of unspecified bronchus or lung: Secondary | ICD-10-CM

## 2012-06-07 LAB — CBC WITH DIFFERENTIAL/PLATELET
BASO%: 0.1 % (ref 0.0–2.0)
Basophils Absolute: 0 10*3/uL (ref 0.0–0.1)
EOS%: 0.3 % (ref 0.0–7.0)
Eosinophils Absolute: 0 10*3/uL (ref 0.0–0.5)
HCT: 38.2 % (ref 34.8–46.6)
HGB: 12.9 g/dL (ref 11.6–15.9)
LYMPH%: 19.1 % (ref 14.0–49.7)
MCH: 26.9 pg (ref 25.1–34.0)
MCHC: 33.8 g/dL (ref 31.5–36.0)
MCV: 79.7 fL (ref 79.5–101.0)
MONO#: 0.4 10*3/uL (ref 0.1–0.9)
MONO%: 6.3 % (ref 0.0–14.0)
NEUT#: 5.2 10*3/uL (ref 1.5–6.5)
NEUT%: 74.2 % (ref 38.4–76.8)
Platelets: 217 10*3/uL (ref 145–400)
RBC: 4.79 10*6/uL (ref 3.70–5.45)
RDW: 18.8 % — ABNORMAL HIGH (ref 11.2–14.5)
WBC: 7 10*3/uL (ref 3.9–10.3)
lymph#: 1.3 10*3/uL (ref 0.9–3.3)
nRBC: 0 % (ref 0–0)

## 2012-06-07 LAB — COMPREHENSIVE METABOLIC PANEL WITH GFR
ALT: 37 U/L — ABNORMAL HIGH (ref 0–35)
AST: 35 U/L (ref 0–37)
Albumin: 4.2 g/dL (ref 3.5–5.2)
Alkaline Phosphatase: 90 U/L (ref 39–117)
BUN: 14 mg/dL (ref 6–23)
CO2: 26 meq/L (ref 19–32)
Calcium: 8.9 mg/dL (ref 8.4–10.5)
Chloride: 100 meq/L (ref 96–112)
Creatinine, Ser: 0.58 mg/dL (ref 0.50–1.10)
Glucose, Bld: 96 mg/dL (ref 70–99)
Potassium: 3.9 meq/L (ref 3.5–5.3)
Sodium: 136 meq/L (ref 135–145)
Total Bilirubin: 0.3 mg/dL (ref 0.3–1.2)
Total Protein: 7.1 g/dL (ref 6.0–8.3)

## 2012-06-07 NOTE — Progress Notes (Signed)
Faxed disability paper to [employer] @ [phone]. °

## 2012-06-14 ENCOUNTER — Other Ambulatory Visit (HOSPITAL_BASED_OUTPATIENT_CLINIC_OR_DEPARTMENT_OTHER): Payer: 59 | Admitting: Lab

## 2012-06-14 DIAGNOSIS — C3490 Malignant neoplasm of unspecified part of unspecified bronchus or lung: Secondary | ICD-10-CM

## 2012-06-14 LAB — COMPREHENSIVE METABOLIC PANEL
ALT: 83 U/L — ABNORMAL HIGH (ref 0–35)
AST: 45 U/L — ABNORMAL HIGH (ref 0–37)
Albumin: 4.2 g/dL (ref 3.5–5.2)
Alkaline Phosphatase: 100 U/L (ref 39–117)
BUN: 10 mg/dL (ref 6–23)
CO2: 26 mEq/L (ref 19–32)
Calcium: 8.7 mg/dL (ref 8.4–10.5)
Chloride: 104 mEq/L (ref 96–112)
Creatinine, Ser: 0.49 mg/dL — ABNORMAL LOW (ref 0.50–1.10)
Glucose, Bld: 113 mg/dL — ABNORMAL HIGH (ref 70–99)
Potassium: 4.2 mEq/L (ref 3.5–5.3)
Sodium: 138 mEq/L (ref 135–145)
Total Bilirubin: 0.3 mg/dL (ref 0.3–1.2)
Total Protein: 7.2 g/dL (ref 6.0–8.3)

## 2012-06-14 LAB — CBC WITH DIFFERENTIAL/PLATELET
BASO%: 0.2 % (ref 0.0–2.0)
Basophils Absolute: 0 10*3/uL (ref 0.0–0.1)
EOS%: 0.3 % (ref 0.0–7.0)
Eosinophils Absolute: 0 10*3/uL (ref 0.0–0.5)
HCT: 38.2 % (ref 34.8–46.6)
HGB: 12.6 g/dL (ref 11.6–15.9)
LYMPH%: 19.6 % (ref 14.0–49.7)
MCH: 27.6 pg (ref 25.1–34.0)
MCHC: 33 g/dL (ref 31.5–36.0)
MCV: 83.7 fL (ref 79.5–101.0)
MONO#: 0.7 10*3/uL (ref 0.1–0.9)
MONO%: 13.6 % (ref 0.0–14.0)
NEUT#: 3.3 10*3/uL (ref 1.5–6.5)
NEUT%: 66.3 % (ref 38.4–76.8)
Platelets: 188 10*3/uL (ref 145–400)
RBC: 4.57 10*6/uL (ref 3.70–5.45)
RDW: 21.6 % — ABNORMAL HIGH (ref 11.2–14.5)
WBC: 5 10*3/uL (ref 3.9–10.3)
lymph#: 1 10*3/uL (ref 0.9–3.3)

## 2012-06-21 ENCOUNTER — Encounter: Payer: Self-pay | Admitting: Physician Assistant

## 2012-06-21 ENCOUNTER — Ambulatory Visit (HOSPITAL_BASED_OUTPATIENT_CLINIC_OR_DEPARTMENT_OTHER): Payer: 59 | Admitting: Physician Assistant

## 2012-06-21 ENCOUNTER — Ambulatory Visit (HOSPITAL_BASED_OUTPATIENT_CLINIC_OR_DEPARTMENT_OTHER): Payer: 59

## 2012-06-21 ENCOUNTER — Other Ambulatory Visit (HOSPITAL_BASED_OUTPATIENT_CLINIC_OR_DEPARTMENT_OTHER): Payer: 59 | Admitting: Lab

## 2012-06-21 VITALS — BP 121/88 | HR 87 | Temp 98.1°F | Ht 60.0 in | Wt 133.1 lb

## 2012-06-21 VITALS — BP 122/82 | HR 91 | Temp 99.1°F

## 2012-06-21 DIAGNOSIS — Z79899 Other long term (current) drug therapy: Secondary | ICD-10-CM

## 2012-06-21 DIAGNOSIS — C3430 Malignant neoplasm of lower lobe, unspecified bronchus or lung: Secondary | ICD-10-CM

## 2012-06-21 DIAGNOSIS — Z5111 Encounter for antineoplastic chemotherapy: Secondary | ICD-10-CM

## 2012-06-21 DIAGNOSIS — C3490 Malignant neoplasm of unspecified part of unspecified bronchus or lung: Secondary | ICD-10-CM

## 2012-06-21 LAB — COMPREHENSIVE METABOLIC PANEL
ALT: 53 U/L — ABNORMAL HIGH (ref 0–35)
AST: 53 U/L — ABNORMAL HIGH (ref 0–37)
Albumin: 4.2 g/dL (ref 3.5–5.2)
Alkaline Phosphatase: 112 U/L (ref 39–117)
BUN: 9 mg/dL (ref 6–23)
CO2: 25 mEq/L (ref 19–32)
Calcium: 9.4 mg/dL (ref 8.4–10.5)
Chloride: 97 mEq/L (ref 96–112)
Creatinine, Ser: 0.39 mg/dL — ABNORMAL LOW (ref 0.50–1.10)
Glucose, Bld: 147 mg/dL — ABNORMAL HIGH (ref 70–99)
Potassium: 4.1 mEq/L (ref 3.5–5.3)
Sodium: 136 mEq/L (ref 135–145)
Total Bilirubin: 0.3 mg/dL (ref 0.3–1.2)
Total Protein: 8.1 g/dL (ref 6.0–8.3)

## 2012-06-21 LAB — CBC WITH DIFFERENTIAL/PLATELET
BASO%: 0.1 % (ref 0.0–2.0)
Basophils Absolute: 0 10*3/uL (ref 0.0–0.1)
EOS%: 0 % (ref 0.0–7.0)
Eosinophils Absolute: 0 10*3/uL (ref 0.0–0.5)
HCT: 38.2 % (ref 34.8–46.6)
HGB: 12.9 g/dL (ref 11.6–15.9)
LYMPH%: 9.7 % — ABNORMAL LOW (ref 14.0–49.7)
MCH: 27.9 pg (ref 25.1–34.0)
MCHC: 33.8 g/dL (ref 31.5–36.0)
MCV: 82.5 fL (ref 79.5–101.0)
MONO#: 0.4 10*3/uL (ref 0.1–0.9)
MONO%: 6.1 % (ref 0.0–14.0)
NEUT#: 5.9 10*3/uL (ref 1.5–6.5)
NEUT%: 84.1 % — ABNORMAL HIGH (ref 38.4–76.8)
Platelets: 291 10*3/uL (ref 145–400)
RBC: 4.63 10*6/uL (ref 3.70–5.45)
RDW: 18.9 % — ABNORMAL HIGH (ref 11.2–14.5)
WBC: 7 10*3/uL (ref 3.9–10.3)
lymph#: 0.7 10*3/uL — ABNORMAL LOW (ref 0.9–3.3)

## 2012-06-21 MED ORDER — ONDANSETRON 16 MG/50ML IVPB (CHCC)
16.0000 mg | Freq: Once | INTRAVENOUS | Status: AC
  Administered 2012-06-21: 16 mg via INTRAVENOUS

## 2012-06-21 MED ORDER — SODIUM CHLORIDE 0.9 % IV SOLN
Freq: Once | INTRAVENOUS | Status: AC
  Administered 2012-06-21: 16:00:00 via INTRAVENOUS

## 2012-06-21 MED ORDER — SODIUM CHLORIDE 0.9 % IV SOLN
600.0000 mg | Freq: Once | INTRAVENOUS | Status: AC
  Administered 2012-06-21: 600 mg via INTRAVENOUS
  Filled 2012-06-21: qty 60

## 2012-06-21 MED ORDER — CYANOCOBALAMIN 1000 MCG/ML IJ SOLN
1000.0000 ug | Freq: Once | INTRAMUSCULAR | Status: AC
  Administered 2012-06-21: 1000 ug via INTRAMUSCULAR

## 2012-06-21 MED ORDER — HEPARIN SOD (PORK) LOCK FLUSH 100 UNIT/ML IV SOLN
500.0000 [IU] | Freq: Once | INTRAVENOUS | Status: AC | PRN
  Administered 2012-06-21: 500 [IU]
  Filled 2012-06-21: qty 5

## 2012-06-21 MED ORDER — SODIUM CHLORIDE 0.9 % IJ SOLN
10.0000 mL | INTRAMUSCULAR | Status: DC | PRN
  Administered 2012-06-21: 10 mL
  Filled 2012-06-21: qty 10

## 2012-06-21 MED ORDER — DEXAMETHASONE SODIUM PHOSPHATE 4 MG/ML IJ SOLN
20.0000 mg | Freq: Once | INTRAMUSCULAR | Status: AC
  Administered 2012-06-21: 20 mg via INTRAVENOUS

## 2012-06-21 MED ORDER — SODIUM CHLORIDE 0.9 % IV SOLN
500.0000 mg/m2 | Freq: Once | INTRAVENOUS | Status: AC
  Administered 2012-06-21: 800 mg via INTRAVENOUS
  Filled 2012-06-21: qty 32

## 2012-06-21 NOTE — Patient Instructions (Addendum)
[facility] Discharge Instructions for Patients Receiving Chemotherapy  Today you received the following chemotherapy agents Alimta and Carboplatin  To help prevent nausea and vomiting after your treatment, we encourage you to take your nausea medication  Begin taking it at 7 pm and take it as often as prescribed for the next 24 to 72 hours.   If you develop nausea and vomiting that is not controlled by your nausea medication, call the clinic. If it is after clinic hours your family physician or the after hours number for the clinic or go to the Emergency Department.   BELOW ARE SYMPTOMS THAT SHOULD BE REPORTED IMMEDIATELY:  *FEVER GREATER THAN 100.5 F  *CHILLS WITH OR WITHOUT FEVER  NAUSEA AND VOMITING THAT IS NOT CONTROLLED WITH YOUR NAUSEA MEDICATION  *UNUSUAL SHORTNESS OF BREATH  *UNUSUAL BRUISING OR BLEEDING  TENDERNESS IN MOUTH AND THROAT WITH OR WITHOUT PRESENCE OF ULCERS  *URINARY PROBLEMS  *BOWEL PROBLEMS  UNUSUAL RASH Items with * indicate a potential emergency and should be followed up as soon as possible.  One of the nurses will contact you 24 hours after your treatment. Please let the nurse know about any problems that you may have experienced. Feel free to call the clinic you have any questions or concerns. The clinic phone number is [phone].   I have been informed and understand all the instructions given to me. I know to contact the clinic, my physician, or go to the Emergency Department if any problems should occur. I do not have any questions at this time, but understand that I may call the clinic during office hours   should I have any questions or need assistance in obtaining follow up care.    __________________________________________  _____________  __________ Signature of Patient or Authorized Representative            Date                   Time    __________________________________________ Nurse's Signature    

## 2012-06-22 ENCOUNTER — Other Ambulatory Visit: Payer: Self-pay | Admitting: Internal Medicine

## 2012-06-22 MED ORDER — FOLIC ACID 1 MG PO TABS: 1.0000 mg | ORAL_TABLET | Freq: Every day | ORAL | Status: DC

## 2012-06-22 MED ORDER — PROCHLORPERAZINE MALEATE 10 MG PO TABS: 10.0000 mg | ORAL_TABLET | Freq: Four times a day (QID) | ORAL | Status: DC | PRN

## 2012-06-22 NOTE — Progress Notes (Signed)
[facility] Telephone:([phone]   [phone]  OFFICE PROGRESS NOTE  PRINCIPAL DIAGNOSIS: Local recurrence of non-small cell lung cancer, adenocarcinoma, initially diagnosed as stage IB (T2a N0 M0) in March of 2010.   PRIOR THERAPY:  1. Status post right lower lobectomy under the care of Dr. Laneta Simmers on 04/08/2009. 2. Status post palliative radiotherapy to the right lower lobe recurrent lung mass under the care of Dr. Mitzi Hansen, completed on June 08, 2011.  CURRENT THERAPY: Systemic chemotherapy with carboplatin for AUC of 5 and Alimta 500 mg/M2 every 3 weeks. She is status post 5 cycles.   INTERVAL HISTORY: [name] 51 y.o. female returns to the clinic today for followup visit accompanied by her son and her husband. She continues to tolerate her systemic chemotherapy with carboplatin and Alimta relatively well. She complains of some mild right side rib cage pain. She reports that her daughter recently had surgery and so her trip to [place] this may be on hold. Her son will call us on Monday and update [place] as to whether his mother plans to proceed with her trip or postpone it. Her followup appointment and appointment for her restaging CT scan will be scheduled accordingly. She requests refills for her folic acid and her Compazine. She voiced no other specific complaints today. She has a history of large uterine fibroids.  She denied having any significant fever or chills. No significant weight loss or night sweats. She has no chest pain but continues to have shortness of breath with exertion.  MEDICAL HISTORY: Past Medical History  Diagnosis Date  . Lung cancer     right lower lobe adenocarcinoma  . History of radiation therapy 05/02/11 to 06/08/11    right lung    ALLERGIES:  is allergic to codeine.  MEDICATIONS:  Current Outpatient Prescriptions  Medication Sig Dispense Refill  . acetaminophen (TYLENOL) 325 MG tablet Take 650 mg by mouth every 6 (six) hours as  needed. For pain      . benzonatate (TESSALON) 100 MG capsule 1 or 2 by mouth every eight hours as needed for cough  30 capsule  2  . chlorpheniramine-HYDROcodone (TUSSIONEX) 10-8 MG/5ML LQCR Take 5 mLs by mouth every 12 (twelve) hours as needed. For cough      . dexamethasone (DECADRON) 4 MG tablet Take 4 mg by mouth 2 (two) times daily. 1 tab PO twice daily day before, day of, and day after chemotherapy      . folic acid (FOLVITE) 1 MG tablet Take 1 tablet (1 mg total) by mouth daily.  30 tablet  3  . HYDROcodone-acetaminophen (VICODIN) 5-500 MG per tablet       . ibuprofen (ADVIL,MOTRIN) 100 MG tablet Take 200 mg by mouth every 6 (six) hours as needed. For pain      . ipratropium (ATROVENT) 0.03 % nasal spray Place 1 spray into the nose 2 (two) times daily.       Marland Kitchen lidocaine-prilocaine (EMLA) cream Apply to Port-A-Cath prior to tx PRN  5 g  prn 1 year  . methylPREDNIsolone (MEDROL DOSPACK) 4 MG tablet       . Multiple Vitamin (MULTIVITAMIN) tablet Take 1 tablet by mouth daily.        . prochlorperazine (COMPAZINE) 10 MG tablet Take 1 tablet (10 mg total) by mouth every 6 (six) hours as needed. For nausea  30 tablet  0  . DISCONTD: prochlorperazine (COMPAZINE) 10 MG tablet Take 10 mg  by mouth every 6 (six) hours as needed. For nausea       No current facility-administered medications for this visit.   Facility-Administered Medications Ordered in Other Visits  Medication Dose Route Frequency Provider Last Rate Last Dose  . 0.9 %  sodium chloride infusion   Intravenous Once Conni Slipper, PA      . CARBOplatin (PARAPLATIN) 600 mg in sodium chloride 0.9 % 250 mL chemo infusion  600 mg Intravenous Once Conni Slipper, PA   600 mg at 06/21/12 1641  . cyanocobalamin ((VITAMIN B-12)) injection 1,000 mcg  1,000 mcg Intramuscular Once Conni Slipper, PA   1,000 mcg at 06/21/12 1603  . dexamethasone (DECADRON) injection 20 mg  20 mg Intravenous Once Conni Slipper, PA   20 mg at 06/21/12 1603    . heparin lock flush 100 unit/mL  500 Units Intracatheter Once PRN Conni Slipper, PA   500 Units at 06/21/12 1717  . ondansetron (ZOFRAN) IVPB 16 mg  16 mg Intravenous Once Conni Slipper, PA   16 mg at 06/21/12 1602  . PEMEtrexed (ALIMTA) 800 mg in sodium chloride 0.9 % 100 mL chemo infusion  500 mg/m2 (Treatment Plan Actual) Intravenous Once Conni Slipper, PA   800 mg at 06/21/12 1626  . DISCONTD: sodium chloride 0.9 % injection 10 mL  10 mL Intracatheter PRN Conni Slipper, PA   10 mL at 06/21/12 1717    SURGICAL HISTORY:  Past Surgical History  Procedure Date  . Lung lobectomy 04/18/2009    RLL  . Portacath placement 02/29/2012    Procedure: INSERTION PORT-A-CATH;  Surgeon: Ines Bloomer, MD;  Location: [facility] OR;  Service: Thoracic;  Laterality: Left;  PowerPort 8 F attachable in left internal jugular.    REVIEW OF SYSTEMS:  Pertinent items are noted in HPI.   PHYSICAL EXAMINATION: General appearance: alert, cooperative and no distress Head: Normocephalic, without obvious abnormality, atraumatic Neck: no adenopathy Lymph nodes: Cervical, supraclavicular, and axillary nodes normal. Resp: clear to auscultation bilaterally Back: symmetric, no curvature. ROM normal. No CVA tenderness. Cardio: regular rate and rhythm, S1, S2 normal, no murmur, click, rub or gallop GI: abnormal findings:  Lower abdominal firmness consistent with the patient's history of uterine fibroids, remainder of the abdominal exam is benign Extremities: extremities normal, atraumatic, no cyanosis or edema Neurologic: Alert and oriented X 3, normal strength and tone. Normal symmetric reflexes. Normal coordination and gait  ECOG PERFORMANCE STATUS: 1 - Symptomatic but completely ambulatory  Blood pressure 121/88, pulse 87, temperature 98.1 F (36.7 C), temperature source Oral, height 5' (1.524 m), weight 133 lb 1.6 oz (60.374 kg).  LABORATORY DATA: Lab Results  Component Value Date   WBC 7.0 06/21/2012    HGB 12.9 06/21/2012   HCT 38.2 06/21/2012   MCV 82.5 06/21/2012   PLT 291 06/21/2012      Chemistry      Component Value Date/Time   NA 136 06/21/2012 1407   NA 141 01/06/2012 0804   K 4.1 06/21/2012 1407   K 4.6 01/06/2012 0804   CL 97 06/21/2012 1407   CL 98 01/06/2012 0804   CO2 25 06/21/2012 1407   CO2 28 01/06/2012 0804   BUN 9 06/21/2012 1407   BUN 10 01/06/2012 0804   CREATININE 0.39* 06/21/2012 1407   CREATININE 0.6 01/06/2012 0804      Component Value Date/Time   CALCIUM 9.4 06/21/2012 1407   CALCIUM 8.9 01/06/2012 0804   ALKPHOS  112 06/21/2012 1407   ALKPHOS 64 01/06/2012 0804   AST 53* 06/21/2012 1407   AST 27 01/06/2012 0804   ALT 53* 06/21/2012 1407   BILITOT 0.3 06/21/2012 1407   BILITOT 0.60 01/06/2012 0804       RADIOGRAPHIC STUDIES: Dg Chest 2 View Within Previous 72 Hours.  Films Obtained On Friday Are Acceptable For Monday And Tuesday Cases  02/29/2012  *RADIOLOGY REPORT*  Clinical Data: Preop surgery.  CHEST - 2 VIEW  Comparison: 01/06/2012 chest CT.  Findings: Postsurgical changes right lung with right upper lobe nodule adjacent to the suture line better detected on the recent to CT.  Right-sided pleural effusion.  Elevated right hemidiaphragm.  Mild cardiomegaly and central pulmonary vascular prominence. Tortuous aorta.  IMPRESSION: Postsurgical changes right lung with right upper lobe nodule adjacent to the suture line better detected on the recent to CT.  Right-sided pleural effusion.  Elevated right hemidiaphragm.  Mild cardiomegaly and central pulmonary vascular prominence.  Tortuous aorta.  Original Report Authenticated By: Fuller Canada, M.D.   Dg Chest Port 1 View  02/29/2012  *RADIOLOGY REPORT*  Clinical Data: Port-A-Cath placement.  PORTABLE CHEST - 1 VIEW  Comparison: 02/29/2012 9:14 a.m.  Findings: Left power port has been placed.  The tip is at the level of the caval atrial junction.  No pneumothorax.  Mild kink proximal aspect of the catheter may represent expected findings.   Clinical correlation recommended.  Attention to this on follow-up two-view exam.  Remainder of findings without change.  IMPRESSION: Left power port has been placed.  The tip is at the level of the caval atrial junction.  No pneumothorax.  Mild kink proximal aspect of the catheter may represent expected findings.  Clinical correlation recommended.  Attention to this on follow-up two-view exam.  Original Report Authenticated By: Fuller Canada, M.D.   Ct Chest W Contrast  05/07/2012  *RADIOLOGY REPORT*  Clinical Data: Field  CT CHEST WITH CONTRAST  Technique:  Multidetector CT imaging of the chest was performed following the standard protocol during bolus administration of intravenous contrast.  Contrast: 80mL OMNIPAQUE IOHEXOL 300 MG/ML  SOLN  Comparison: chest CT from 01/06/2012.  PET CT from 01/16/2012.  Findings: The tip of the left-sided Port-A-Cath projects at the level of the mid right atrium.  There is no axillary lymphadenopathy.  The postsurgical and post-treatment changes in the right hilum and perihilar region are stable.  The soft tissue opacity in the right hilum is stable.  No change in the small right pleural effusion.  The cardiopericardial silhouette is within normal limits for size. Volume loss in the right hemithorax is unchanged.  There is no pericardial or pleural effusion.  1.3 x 2.1 cm posteromedial right upper lobe pulmonary nodule seen on the previous study has decreased in size in the interval with now only a cluster of tiny nodules visible in the same region. This is seen on image 13 of today's study represents the region of most hypermetabolic disease on the previous PET CT.  Left lung is clear.  Bone windows reveal no worrisome lytic or sclerotic osseous lesions.  IMPRESSION: The hypermetabolic posteromedial right upper lung nodule seen on the previous PET CT has decreased in the interval.  Stable appearance of right hilar and perihilar soft tissue attenuation, likely reflecting a  combination of treatment and surgery.  Original Report Authenticated By: ERIC A. MANSELL, M.D.    ASSESSMENT/PLAN: This is a very pleasant 51 years old Asian female with a  recurrent non-small cell lung cancer, adenocarcinoma. She is currently on systemic chemotherapy with carboplatin and Alimta status post 5 cycles. The patient is tolerating her treatment fairly well. The patient was discussed with  Dr. Arbutus Ped. She'll proceed with cycle #6 of her systemic chemotherapy with carboplatin and Alimta. She will continue with weekly labs consisting of a CBC differential and C. Met. He will wait word from her son on Monday, 06/25/2012 as to whether the patient plans to take her trip to [facility]. We will plan her restaging CT and followup visit with Dr. Arbutus Ped accordingly. And Compazine were sent to her pharmacy of record via [employer]. The patient's son son Ra's contact information is as follows: cell number [phone] on alternate number [phone] regarding his mother's decision  Conni Slipper, PA-C   All questions were answered. The patient knows to call the clinic with any problems, questions or concerns. We can certainly see the patient much sooner if necessary.

## 2012-06-26 ENCOUNTER — Other Ambulatory Visit: Payer: Self-pay | Admitting: Physician Assistant

## 2012-06-26 DIAGNOSIS — C3490 Malignant neoplasm of unspecified part of unspecified bronchus or lung: Secondary | ICD-10-CM

## 2012-06-27 ENCOUNTER — Telehealth: Payer: Self-pay | Admitting: Medical Oncology

## 2012-06-27 ENCOUNTER — Telehealth: Payer: Self-pay | Admitting: Internal Medicine

## 2012-06-27 NOTE — Telephone Encounter (Signed)
l/m with appts   aom 

## 2012-06-27 NOTE — Telephone Encounter (Signed)
Called to say his mother is not going to [place] and to schedule Ct . I gave him appointment for ct and f/u

## 2012-07-06 ENCOUNTER — Ambulatory Visit (HOSPITAL_COMMUNITY)
Admission: RE | Admit: 2012-07-06 | Discharge: 2012-07-06 | Disposition: A | Discharge status: Home / Self Care | Payer: 59 | Source: Ambulatory Visit | Attending: Physician Assistant | Admitting: Physician Assistant

## 2012-07-06 DIAGNOSIS — K7689 Other specified diseases of liver: Secondary | ICD-10-CM | POA: Insufficient documentation

## 2012-07-06 DIAGNOSIS — Y842 Radiological procedure and radiotherapy as the cause of abnormal reaction of the patient, or of later complication, without mention of misadventure at the time of the procedure: Secondary | ICD-10-CM | POA: Insufficient documentation

## 2012-07-06 DIAGNOSIS — J90 Pleural effusion, not elsewhere classified: Secondary | ICD-10-CM | POA: Insufficient documentation

## 2012-07-06 DIAGNOSIS — Z923 Personal history of irradiation: Secondary | ICD-10-CM | POA: Insufficient documentation

## 2012-07-06 DIAGNOSIS — J701 Chronic and other pulmonary manifestations due to radiation: Secondary | ICD-10-CM | POA: Insufficient documentation

## 2012-07-06 DIAGNOSIS — C3490 Malignant neoplasm of unspecified part of unspecified bronchus or lung: Secondary | ICD-10-CM | POA: Insufficient documentation

## 2012-07-06 DIAGNOSIS — Z79899 Other long term (current) drug therapy: Secondary | ICD-10-CM | POA: Insufficient documentation

## 2012-07-06 MED ORDER — IOHEXOL 300 MG/ML  SOLN
80.0000 mL | Freq: Once | INTRAMUSCULAR | Status: AC | PRN
  Administered 2012-07-06: 80 mL via INTRAVENOUS

## 2012-07-12 ENCOUNTER — Other Ambulatory Visit: Payer: Self-pay | Admitting: *Deleted

## 2012-07-12 ENCOUNTER — Ambulatory Visit (HOSPITAL_BASED_OUTPATIENT_CLINIC_OR_DEPARTMENT_OTHER): Payer: 59 | Admitting: Internal Medicine

## 2012-07-12 ENCOUNTER — Encounter: Payer: Self-pay | Admitting: Internal Medicine

## 2012-07-12 ENCOUNTER — Other Ambulatory Visit (HOSPITAL_BASED_OUTPATIENT_CLINIC_OR_DEPARTMENT_OTHER): Payer: 59 | Admitting: Lab

## 2012-07-12 VITALS — BP 126/97 | HR 97 | Temp 97.0°F | Resp 20 | Ht 60.0 in | Wt 135.5 lb

## 2012-07-12 DIAGNOSIS — C3490 Malignant neoplasm of unspecified part of unspecified bronchus or lung: Secondary | ICD-10-CM

## 2012-07-12 DIAGNOSIS — C3430 Malignant neoplasm of lower lobe, unspecified bronchus or lung: Secondary | ICD-10-CM

## 2012-07-12 LAB — CBC WITH DIFFERENTIAL/PLATELET
BASO%: 0.3 % (ref 0.0–2.0)
Basophils Absolute: 0 10*3/uL (ref 0.0–0.1)
EOS%: 0.2 % (ref 0.0–7.0)
Eosinophils Absolute: 0 10*3/uL (ref 0.0–0.5)
HCT: 37.1 % (ref 34.8–46.6)
HGB: 12.7 g/dL (ref 11.6–15.9)
LYMPH%: 19.7 % (ref 14.0–49.7)
MCH: 29.6 pg (ref 25.1–34.0)
MCHC: 34.2 g/dL (ref 31.5–36.0)
MCV: 86.3 fL (ref 79.5–101.0)
MONO#: 0.3 10*3/uL (ref 0.1–0.9)
MONO%: 7.6 % (ref 0.0–14.0)
NEUT#: 3 10*3/uL (ref 1.5–6.5)
NEUT%: 72.2 % (ref 38.4–76.8)
Platelets: 268 10*3/uL (ref 145–400)
RBC: 4.3 10*6/uL (ref 3.70–5.45)
RDW: 19.2 % — ABNORMAL HIGH (ref 11.2–14.5)
WBC: 4.2 10*3/uL (ref 3.9–10.3)
lymph#: 0.8 10*3/uL — ABNORMAL LOW (ref 0.9–3.3)

## 2012-07-12 LAB — COMPREHENSIVE METABOLIC PANEL
ALT: 49 U/L — ABNORMAL HIGH (ref 0–35)
AST: 44 U/L — ABNORMAL HIGH (ref 0–37)
Albumin: 4 g/dL (ref 3.5–5.2)
Alkaline Phosphatase: 93 U/L (ref 39–117)
BUN: 8 mg/dL (ref 6–23)
CO2: 24 mEq/L (ref 19–32)
Calcium: 8.6 mg/dL (ref 8.4–10.5)
Chloride: 104 mEq/L (ref 96–112)
Creatinine, Ser: 0.45 mg/dL — ABNORMAL LOW (ref 0.50–1.10)
Glucose, Bld: 200 mg/dL — ABNORMAL HIGH (ref 70–99)
Potassium: 3.9 mEq/L (ref 3.5–5.3)
Sodium: 137 mEq/L (ref 135–145)
Total Bilirubin: 0.3 mg/dL (ref 0.3–1.2)
Total Protein: 7 g/dL (ref 6.0–8.3)

## 2012-07-12 MED ORDER — ERLOTINIB HCL 150 MG PO TABS: 150.0000 mg | ORAL_TABLET | Freq: Every day | ORAL | Status: DC

## 2012-07-12 NOTE — Telephone Encounter (Signed)
Rx for Tarceva E-prescribed to [facility] pharmacy, pt facesheet and insurance faxed to [employer].  SLJ

## 2012-07-12 NOTE — Progress Notes (Signed)
[facility] Telephone:(336) [phone]   [phone]  OFFICE PROGRESS NOTE  PRINCIPAL DIAGNOSIS: Local recurrence of non-small cell lung cancer, adenocarcinoma, initially diagnosed as stage IB (T2a N0 M0) in March of 2010.   PRIOR THERAPY:  1. Status post right lower lobectomy under the care of Dr. Laneta Simmers on 04/08/2009. 2. Status post palliative radiotherapy to the right lower lobe recurrent lung mass under the care of Dr. Mitzi Hansen, completed on June 08, 2011. 3. Systemic chemotherapy with carboplatin for AUC of 5 and Alimta 500 mg/M2 every 3 weeks. She is status post 3 cycles.  CURRENT THERAPY: The patient was started in the next few days Tarceva 150 mg by mouth daily.   INTERVAL HISTORY: [name] 51 y.o. female returns to the clinic today for followup visit accompanied by her son. The patient is doing fine today with no specific complaints except for fatigue. She tolerated the last 3 cycles of her chemotherapy fairly well except for mild fatigue. She denied having any significant fever or chills, no nausea or vomiting. The patient denied having any significant chest pain, shortness breath, cough or hemoptysis. She has repeat CT scan of the chest performed recently and she is here today for evaluation and discussion of her scan results.  MEDICAL HISTORY: Past Medical History  Diagnosis Date  . Lung cancer     right lower lobe adenocarcinoma  . History of radiation therapy 05/02/11 to 06/08/11    right lung    ALLERGIES:  is allergic to codeine.  MEDICATIONS:  Current Outpatient Prescriptions  Medication Sig Dispense Refill  . acetaminophen (TYLENOL) 325 MG tablet Take 650 mg by mouth every 6 (six) hours as needed. For pain      . benzonatate (TESSALON) 100 MG capsule 1 or 2 by mouth every eight hours as needed for cough  30 capsule  2  . chlorpheniramine-HYDROcodone (TUSSIONEX) 10-8 MG/5ML LQCR Take 5 mLs by mouth every 12 (twelve) hours as needed. For cough        . folic acid (FOLVITE) 1 MG tablet Take 1 tablet (1 mg total) by mouth daily.  30 tablet  3  . ibuprofen (ADVIL,MOTRIN) 100 MG tablet Take 200 mg by mouth every 6 (six) hours as needed. For pain      . ipratropium (ATROVENT) 0.03 % nasal spray Place 1 spray into the nose 2 (two) times daily.       Marland Kitchen lidocaine-prilocaine (EMLA) cream Apply to Port-A-Cath prior to tx PRN  5 g  prn 1 year  . methylPREDNIsolone (MEDROL DOSPACK) 4 MG tablet       . Multiple Vitamin (MULTIVITAMIN) tablet Take 1 tablet by mouth daily.        . prochlorperazine (COMPAZINE) 10 MG tablet Take 10 mg by mouth every 6 (six) hours as needed. For nausea      . DISCONTD: prochlorperazine (COMPAZINE) 10 MG tablet Take 1 tablet (10 mg total) by mouth every 6 (six) hours as needed. For nausea  30 tablet  0  . dexamethasone (DECADRON) 4 MG tablet Take 4 mg by mouth 2 (two) times daily. 1 tab PO twice daily day before, day of, and day after chemotherapy      . HYDROcodone-acetaminophen (VICODIN) 5-500 MG per tablet         SURGICAL HISTORY:  Past Surgical History  Procedure Date  . Lung lobectomy 04/18/2009    RLL  . Portacath placement 02/29/2012    Procedure: INSERTION PORT-A-CATH;  Surgeon: Ines Bloomer, MD;  Location: [facility] OR;  Service: Thoracic;  Laterality: Left;  PowerPort 8 F attachable in left internal jugular.    REVIEW OF SYSTEMS:  A comprehensive review of systems was negative.   PHYSICAL EXAMINATION: General appearance: alert, cooperative and no distress Head: Normocephalic, without obvious abnormality, atraumatic Neck: no adenopathy Lymph nodes: Cervical, supraclavicular, and axillary nodes normal. Resp: clear to auscultation bilaterally Cardio: regular rate and rhythm, S1, S2 normal, no murmur, click, rub or gallop GI: soft, non-tender; bowel sounds normal; no masses,  no organomegaly Extremities: extremities normal, atraumatic, no cyanosis or edema Neurologic: Alert and oriented X 3, normal strength and  tone. Normal symmetric reflexes. Normal coordination and gait  ECOG PERFORMANCE STATUS: 1 - Symptomatic but completely ambulatory  Blood pressure 126/97, pulse 97, temperature 97 F (36.1 C), temperature source Oral, resp. rate 20, height 5' (1.524 m), weight 135 lb 8 oz (61.462 kg).  LABORATORY DATA: Lab Results  Component Value Date   WBC 4.2 07/12/2012   HGB 12.7 07/12/2012   HCT 37.1 07/12/2012   MCV 86.3 07/12/2012   PLT 268 07/12/2012      Chemistry      Component Value Date/Time   NA 136 06/21/2012 1407   NA 141 01/06/2012 0804   K 4.1 06/21/2012 1407   K 4.6 01/06/2012 0804   CL 97 06/21/2012 1407   CL 98 01/06/2012 0804   CO2 25 06/21/2012 1407   CO2 28 01/06/2012 0804   BUN 9 06/21/2012 1407   BUN 10 01/06/2012 0804   CREATININE 0.39* 06/21/2012 1407   CREATININE 0.6 01/06/2012 0804      Component Value Date/Time   CALCIUM 9.4 06/21/2012 1407   CALCIUM 8.9 01/06/2012 0804   ALKPHOS 112 06/21/2012 1407   ALKPHOS 64 01/06/2012 0804   AST 53* 06/21/2012 1407   AST 27 01/06/2012 0804   ALT 53* 06/21/2012 1407   BILITOT 0.3 06/21/2012 1407   BILITOT 0.60 01/06/2012 0804       RADIOGRAPHIC STUDIES: Ct Chest W Contrast  07/06/2012  *RADIOLOGY REPORT*  Clinical Data: History of right lower lobectomy for lung cancer (adenocarcinoma) in 2010, with local recurrence for which the patient has undergone radiation therapy, completed 06/08/2011, currently undergoing chemotherapy.  CT CHEST WITH CONTRAST 07/06/2012:  Technique:  Multidetector CT imaging of the chest was performed following the standard protocol during bolus administration of intravenous contrast.  Contrast: 80mL OMNIPAQUE IOHEXOL 300 MG/ML.  Comparison: CT chest 05/07/2012, 01/06/2012.  PET CT 01/16/2012.  Findings: No significant change in the partially loculated right pleural effusion, moderate in size, since the 05/07/2012 examination.  Interval increase in size of the mass in the right upper lobe posteriorly which has become more  conglomerate and now measures approximately 1.7 x 3.4 cm (series 7, image 13).  The mass extends inferiorly to the level of the carina with a superoinferior measurement of 3.5 cm.  No new pulmonary parenchymal nodules or masses.  Post radiation pneumonitis/fibrosis and bronchiectasis in the medial right upper and middle lobe.  No left pleural effusion.  Heart size normal.  No pericardial effusion.  No visible coronary artery calcification.  No visible atherosclerosis involving the thoracic or upper abdominal aorta or their visualized branches.  No new or enlarging mediastinal, hilar, or axillary lymphadenopathy.  Visualized thyroid gland unremarkable.  Left subclavian Port-A-Cath catheter tip at the cavoatrial junction.  Diffuse hepatic steatosis with scattered areas of focal sparing, including the gallbladder fossa.  Remaining visualized  upper abdomen unremarkable.  Bone window images unremarkable.  IMPRESSION:  1.  Interval increase in size of the mass in the posterior right upper lobe, measured above, since the 05/07/2012 examination. 2.  Stable right pleural effusion which is partially loculated. 3.  Postradiation pneumonitis/fibrosis and bronchiectasis in the medial right upper and middle lobes. 4.  No new pulmonary parenchymal nodules or masses. 5.  No new or enlarging lymphadenopathy. 6.  Diffuse hepatic steatosis with focal sparing in the gallbladder fossa.  Original Report Authenticated By: Arnell Sieving, M.D.    ASSESSMENT: 51 years old Asian female with recurrent non-small cell lung cancer most recently treated with 3 cycles of systemic chemotherapy with carboplatin and Alimta but unfortunately the patient has some evidence for disease progression in the posterior right upper lobe mass.  PLAN: I discussed the scan results with the patient and her son. I gave the patient the option of treatment with oral Tarceva versus second line chemotherapy with Taxotere. After discussion of the 2 options the  patient agreed to proceed with treatment with Tarceva. I recommended for her consideration of treatment with oral Tarceva 150 mg by mouth daily. I discussed with the patient adverse effect of this treatment including but not limited to skin rash, diarrhea, interstitial lung disease, nausea or vomiting as well as liver or renal dysfunction. The patient was advised to use Imodium for diarrhea as needed and also to call us immediately if she has any significant skin rash. We will send her a prescription to with a long outpatient pharmacy. She would come back for followup visit in 2 weeks for evaluation and management any adverse effect of her treatment. The patient was also given handout and starter kit for Tarceva.  All questions were answered. The patient knows to call the clinic with any problems, questions or concerns. We can certainly see the patient much sooner if necessary.  I spent 15 minutes counseling the patient face to face. The total time spent in the appointment was 25 minutes.

## 2012-07-12 NOTE — Telephone Encounter (Signed)
[facility] pharmacy was unable to fill rx for tarceva, rx sent to [facility] along with demographics and insurance card.  SLJ

## 2012-07-12 NOTE — Progress Notes (Signed)
[employer] [id number] approved tarceva 150mg  from 06/12/12-07/12/13 auth # [id number].

## 2012-07-12 NOTE — Telephone Encounter (Signed)
gve the pt's son the aug,sept 2013 appt calendar

## 2012-07-13 ENCOUNTER — Telehealth: Payer: Self-pay | Admitting: *Deleted

## 2012-07-13 NOTE — Telephone Encounter (Signed)
Received fax from [facility] that pt's rx has been transferred to [facility] pharmacy.    Ph# [phone] Fax# [phone]

## 2012-07-17 ENCOUNTER — Telehealth: Payer: Self-pay | Admitting: Medical Oncology

## 2012-07-17 ENCOUNTER — Encounter: Payer: Self-pay | Admitting: Internal Medicine

## 2012-07-17 NOTE — Telephone Encounter (Signed)
Faxed records to disability

## 2012-07-17 NOTE — Progress Notes (Signed)
Faxed clinical information to [facility] [place], [phone], to extend patient's disability.

## 2012-07-17 NOTE — Telephone Encounter (Signed)
[facility]ed a signed  [facility]

## 2012-07-23 ENCOUNTER — Other Ambulatory Visit: Payer: Self-pay | Admitting: *Deleted

## 2012-07-23 NOTE — Progress Notes (Signed)
Pt started taking Tarceva today, will move f/u with AJ from tomorrow to 2 weeks.  Pt's son Ra aware to not come tomorrow.  SLJ

## 2012-07-24 ENCOUNTER — Other Ambulatory Visit: Payer: 59 | Admitting: Lab

## 2012-07-24 ENCOUNTER — Ambulatory Visit: Payer: 59 | Admitting: Physician Assistant

## 2012-07-26 ENCOUNTER — Telehealth: Payer: Self-pay | Admitting: Internal Medicine

## 2012-07-26 NOTE — Telephone Encounter (Signed)
S/w pt's son Ra re appt for 9/9.

## 2012-08-06 ENCOUNTER — Other Ambulatory Visit (HOSPITAL_BASED_OUTPATIENT_CLINIC_OR_DEPARTMENT_OTHER): Payer: 59 | Admitting: Lab

## 2012-08-06 ENCOUNTER — Ambulatory Visit (HOSPITAL_BASED_OUTPATIENT_CLINIC_OR_DEPARTMENT_OTHER): Payer: 59 | Admitting: Physician Assistant

## 2012-08-06 ENCOUNTER — Encounter: Payer: Self-pay | Admitting: Physician Assistant

## 2012-08-06 VITALS — BP 112/90 | HR 103 | Temp 97.1°F | Resp 20 | Ht 60.0 in | Wt 131.5 lb

## 2012-08-06 DIAGNOSIS — C3430 Malignant neoplasm of lower lobe, unspecified bronchus or lung: Secondary | ICD-10-CM

## 2012-08-06 DIAGNOSIS — C3490 Malignant neoplasm of unspecified part of unspecified bronchus or lung: Secondary | ICD-10-CM

## 2012-08-06 LAB — CBC WITH DIFFERENTIAL/PLATELET
BASO%: 0.6 % (ref 0.0–2.0)
Basophils Absolute: 0 10*3/uL (ref 0.0–0.1)
EOS%: 0.9 % (ref 0.0–7.0)
Eosinophils Absolute: 0.1 10*3/uL (ref 0.0–0.5)
HCT: 43.3 % (ref 34.8–46.6)
HGB: 14.4 g/dL (ref 11.6–15.9)
LYMPH%: 15.8 % (ref 14.0–49.7)
MCH: 28.9 pg (ref 25.1–34.0)
MCHC: 33.4 g/dL (ref 31.5–36.0)
MCV: 86.8 fL (ref 79.5–101.0)
MONO#: 0.6 10*3/uL (ref 0.1–0.9)
MONO%: 9 % (ref 0.0–14.0)
NEUT#: 4.6 10*3/uL (ref 1.5–6.5)
NEUT%: 73.7 % (ref 38.4–76.8)
Platelets: 240 10*3/uL (ref 145–400)
RBC: 4.99 10*6/uL (ref 3.70–5.45)
RDW: 16.2 % — ABNORMAL HIGH (ref 11.2–14.5)
WBC: 6.3 10*3/uL (ref 3.9–10.3)
lymph#: 1 10*3/uL (ref 0.9–3.3)

## 2012-08-06 LAB — COMPREHENSIVE METABOLIC PANEL (CC13)
ALT: 74 U/L — ABNORMAL HIGH (ref 0–55)
AST: 60 U/L — ABNORMAL HIGH (ref 5–34)
Albumin: 4 g/dL (ref 3.5–5.0)
Alkaline Phosphatase: 104 U/L (ref 40–150)
BUN: 13 mg/dL (ref 7.0–26.0)
CO2: 26 mEq/L (ref 22–29)
Calcium: 9.5 mg/dL (ref 8.4–10.4)
Chloride: 103 mEq/L (ref 98–107)
Creatinine: 0.7 mg/dL (ref 0.6–1.1)
Glucose: 86 mg/dl (ref 70–99)
Potassium: 4 mEq/L (ref 3.5–5.1)
Sodium: 138 mEq/L (ref 136–145)
Total Bilirubin: 0.8 mg/dL (ref 0.20–1.20)
Total Protein: 7.8 g/dL (ref 6.4–8.3)

## 2012-08-06 NOTE — Progress Notes (Signed)
[facility] [phone]) [phone]   Fax:([phone]  OFFICE PROGRESS NOTE  PRINCIPAL DIAGNOSIS: Local recurrence of non-small cell lung cancer, adenocarcinoma, initially diagnosed as stage IB (T2a N0 M0) in March of 2010.   PRIOR THERAPY:  1. Status post right lower lobectomy under the care of Dr. Laneta Simmers on 04/08/2009. 2. Status post palliative radiotherapy to the right lower lobe recurrent lung mass under the care of Dr. Mitzi Hansen, completed on June 08, 2011. 3. Systemic chemotherapy with carboplatin for AUC of 5 and Alimta 500 mg/M2 every 3 weeks. She is status post 3 cycles.  CURRENT THERAPY:  Tarceva 150 mg by mouth daily, therapy beginning 07/24/2012.   INTERVAL HISTORY: [name] 51 y.o. female returns to the clinic today for followup visit accompanied by her son. The patient is tolerating  the Tarceva therapy without difficulty. She's had some mild diarrhea but not to the point where she needs to take any Imodium. She has developed a very mild skin rash primarily on her chin and cheeks. She is concerned about her Port-A-Cath being flushed. The last time it was flushed as when she last had chemotherapy which was 06/21/2012. She voiced no other complaints today. She does not require any prescription refills today. She denied having any significant fever or chills, no nausea or vomiting. The patient denied having any significant chest pain, shortness breath, cough or hemoptysis.   MEDICAL HISTORY: Past Medical History  Diagnosis Date  . Lung cancer     right lower lobe adenocarcinoma  . History of radiation therapy 05/02/11 to 06/08/11    right lung    ALLERGIES:  is allergic to codeine.  MEDICATIONS:  Current Outpatient Prescriptions  Medication Sig Dispense Refill  . acetaminophen (TYLENOL) 325 MG tablet Take 650 mg by mouth every 6 (six) hours as needed. For pain      . benzonatate (TESSALON) 100 MG capsule 1 or 2 by mouth every eight hours as needed for cough   30 capsule  2  . chlorpheniramine-HYDROcodone (TUSSIONEX) 10-8 MG/5ML LQCR Take 5 mLs by mouth every 12 (twelve) hours as needed. For cough      . dexamethasone (DECADRON) 4 MG tablet Take 4 mg by mouth 2 (two) times daily. 1 tab PO twice daily day before, day of, and day after chemotherapy      . erlotinib (TARCEVA) 150 MG tablet Take 1 tablet (150 mg total) by mouth daily. Take on an empty stomach 1 hour before meals or 2 hours after.  30 tablet  2  . folic acid (FOLVITE) 1 MG tablet Take 1 tablet (1 mg total) by mouth daily.  30 tablet  3  . HYDROcodone-acetaminophen (VICODIN) 5-500 MG per tablet       . ibuprofen (ADVIL,MOTRIN) 100 MG tablet Take 200 mg by mouth every 6 (six) hours as needed. For pain      . ipratropium (ATROVENT) 0.03 % nasal spray Place 1 spray into the nose 2 (two) times daily.       Marland Kitchen lidocaine-prilocaine (EMLA) cream Apply to Port-A-Cath prior to tx PRN  5 g  prn 1 year  . methylPREDNIsolone (MEDROL DOSPACK) 4 MG tablet       . Multiple Vitamin (MULTIVITAMIN) tablet Take 1 tablet by mouth daily.        . prochlorperazine (COMPAZINE) 10 MG tablet Take 10 mg by mouth every 6 (six) hours as needed. For nausea        SURGICAL HISTORY:  Past Surgical History  Procedure Date  . Lung lobectomy 04/18/2009    RLL  . Portacath placement 02/29/2012    Procedure: INSERTION PORT-A-CATH;  Surgeon: Ines Bloomer, MD;  Location: [facility] OR;  Service: Thoracic;  Laterality: Left;  PowerPort 8 F attachable in left internal jugular.    REVIEW OF SYSTEMS:  A comprehensive review of systems was negative except for: Gastrointestinal: positive for diarrhea Mild skin rash   PHYSICAL EXAMINATION: General appearance: alert, cooperative and no distress Head: Normocephalic, without obvious abnormality, atraumatic Neck: no adenopathy Lymph nodes: Cervical, supraclavicular, and axillary nodes normal. Resp: clear to auscultation bilaterally Cardio: regular rate and rhythm, S1, S2 normal, no  murmur, click, rub or gallop GI: soft, non-tender; bowel sounds normal; no masses,  no organomegaly Extremities: extremities normal, atraumatic, no cyanosis or edema Neurologic: Alert and oriented X 3, normal strength and tone. Normal symmetric reflexes. Normal coordination and gait Skin: Mild erythema and dryness and punctate acneiform lesions about the chin and cheeks on the face, no evidence of superinfection  ECOG PERFORMANCE STATUS: 1 - Symptomatic but completely ambulatory  Blood pressure 112/90, pulse 103, temperature 97.1 F (36.2 C), temperature source Oral, resp. rate 20, height 5' (1.524 m), weight 131 lb 8 oz (59.648 kg).  LABORATORY DATA: Lab Results  Component Value Date   WBC 6.3 08/06/2012   HGB 14.4 08/06/2012   HCT 43.3 08/06/2012   MCV 86.8 08/06/2012   PLT 240 08/06/2012      Chemistry      Component Value Date/Time   NA 137 07/12/2012 1121   NA 141 01/06/2012 0804   K 3.9 07/12/2012 1121   K 4.6 01/06/2012 0804   CL 104 07/12/2012 1121   CL 98 01/06/2012 0804   CO2 24 07/12/2012 1121   CO2 28 01/06/2012 0804   BUN 8 07/12/2012 1121   BUN 10 01/06/2012 0804   CREATININE 0.45* 07/12/2012 1121   CREATININE 0.6 01/06/2012 0804      Component Value Date/Time   CALCIUM 8.6 07/12/2012 1121   CALCIUM 8.9 01/06/2012 0804   ALKPHOS 93 07/12/2012 1121   ALKPHOS 64 01/06/2012 0804   AST 44* 07/12/2012 1121   AST 27 01/06/2012 0804   ALT 49* 07/12/2012 1121   BILITOT 0.3 07/12/2012 1121   BILITOT 0.60 01/06/2012 0804       RADIOGRAPHIC STUDIES: Ct Chest W Contrast  07/06/2012  *RADIOLOGY REPORT*  Clinical Data: History of right lower lobectomy for lung cancer (adenocarcinoma) in 2010, with local recurrence for which the patient has undergone radiation therapy, completed 06/08/2011, currently undergoing chemotherapy.  CT CHEST WITH CONTRAST 07/06/2012:  Technique:  Multidetector CT imaging of the chest was performed following the standard protocol during bolus administration of intravenous  contrast.  Contrast: 80mL OMNIPAQUE IOHEXOL 300 MG/ML.  Comparison: CT chest 05/07/2012, 01/06/2012.  PET CT 01/16/2012.  Findings: No significant change in the partially loculated right pleural effusion, moderate in size, since the 05/07/2012 examination.  Interval increase in size of the mass in the right upper lobe posteriorly which has become more conglomerate and now measures approximately 1.7 x 3.4 cm (series 7, image 13).  The mass extends inferiorly to the level of the carina with a superoinferior measurement of 3.5 cm.  No new pulmonary parenchymal nodules or masses.  Post radiation pneumonitis/fibrosis and bronchiectasis in the medial right upper and middle lobe.  No left pleural effusion.  Heart size normal.  No pericardial effusion.  No visible coronary artery calcification.  No visible atherosclerosis involving the thoracic or upper abdominal aorta or their visualized branches.  No new or enlarging mediastinal, hilar, or axillary lymphadenopathy.  Visualized thyroid gland unremarkable.  Left subclavian Port-A-Cath catheter tip at the cavoatrial junction.  Diffuse hepatic steatosis with scattered areas of focal sparing, including the gallbladder fossa.  Remaining visualized upper abdomen unremarkable.  Bone window images unremarkable.  IMPRESSION:  1.  Interval increase in size of the mass in the posterior right upper lobe, measured above, since the 05/07/2012 examination. 2.  Stable right pleural effusion which is partially loculated. 3.  Postradiation pneumonitis/fibrosis and bronchiectasis in the medial right upper and middle lobes. 4.  No new pulmonary parenchymal nodules or masses. 5.  No new or enlarging lymphadenopathy. 6.  Diffuse hepatic steatosis with focal sparing in the gallbladder fossa.  Original Report Authenticated By: Arnell Sieving, M.D.    ASSESSMENT/PLAN: 51 years old Asian female with recurrent non-small cell lung cancer most recently treated with 3 cycles of systemic  chemotherapy with carboplatin and Alimta but unfortunately the patient has some evidence for disease progression in the posterior right upper lobe mass. Patient was discussed with Dr. Arbutus Ped. She will continue on Tarceva 150 mg by mouth daily. She'll return in 2 weeks with another CBC differential and C. met for a symptom management visit.. We will also arrange to flush her port Port-A-Cath as by that time we will been approximately 8 weeks since it was last used. She's advised to use sunscreen should she desire to be out of the sun. Patient voiced understanding.  Laural Benes, Jullian Clayson E, PA-C   All questions were answered. The patient knows to call the clinic with any problems, questions or concerns. We can certainly see the patient much sooner if necessary.  I spent 20 minutes counseling the patient face to face. The total time spent in the appointment was 30 minutes.

## 2012-08-06 NOTE — Patient Instructions (Addendum)
Continue on Tarceva 150 mg by mouth daily Follow up in 2 weeks for another symptom management visit and Port a cath flush

## 2012-08-20 ENCOUNTER — Telehealth: Payer: Self-pay | Admitting: Internal Medicine

## 2012-08-20 ENCOUNTER — Ambulatory Visit (HOSPITAL_BASED_OUTPATIENT_CLINIC_OR_DEPARTMENT_OTHER): Payer: 59 | Admitting: Physician Assistant

## 2012-08-20 ENCOUNTER — Other Ambulatory Visit (HOSPITAL_BASED_OUTPATIENT_CLINIC_OR_DEPARTMENT_OTHER): Payer: 59 | Admitting: Lab

## 2012-08-20 ENCOUNTER — Encounter: Payer: Self-pay | Admitting: Physician Assistant

## 2012-08-20 ENCOUNTER — Ambulatory Visit: Payer: 59

## 2012-08-20 VITALS — BP 115/82 | HR 100 | Temp 98.9°F | Resp 20 | Ht 60.0 in | Wt 133.5 lb

## 2012-08-20 DIAGNOSIS — C3490 Malignant neoplasm of unspecified part of unspecified bronchus or lung: Secondary | ICD-10-CM

## 2012-08-20 DIAGNOSIS — C3430 Malignant neoplasm of lower lobe, unspecified bronchus or lung: Secondary | ICD-10-CM

## 2012-08-20 DIAGNOSIS — C801 Malignant (primary) neoplasm, unspecified: Secondary | ICD-10-CM

## 2012-08-20 DIAGNOSIS — C3410 Malignant neoplasm of upper lobe, unspecified bronchus or lung: Secondary | ICD-10-CM

## 2012-08-20 LAB — CBC WITH DIFFERENTIAL/PLATELET
BASO%: 0.5 % (ref 0.0–2.0)
Basophils Absolute: 0 10*3/uL (ref 0.0–0.1)
EOS%: 0.9 % (ref 0.0–7.0)
Eosinophils Absolute: 0.1 10*3/uL (ref 0.0–0.5)
HCT: 42 % (ref 34.8–46.6)
HGB: 13.7 g/dL (ref 11.6–15.9)
LYMPH%: 22.9 % (ref 14.0–49.7)
MCH: 28.3 pg (ref 25.1–34.0)
MCHC: 32.6 g/dL (ref 31.5–36.0)
MCV: 86.8 fL (ref 79.5–101.0)
MONO#: 0.4 10*3/uL (ref 0.1–0.9)
MONO%: 6.6 % (ref 0.0–14.0)
NEUT#: 3.8 10*3/uL (ref 1.5–6.5)
NEUT%: 69.1 % (ref 38.4–76.8)
Platelets: 217 10*3/uL (ref 145–400)
RBC: 4.84 10*6/uL (ref 3.70–5.45)
RDW: 14.2 % (ref 11.2–14.5)
WBC: 5.5 10*3/uL (ref 3.9–10.3)
lymph#: 1.3 10*3/uL (ref 0.9–3.3)
nRBC: 0 % (ref 0–0)

## 2012-08-20 LAB — COMPREHENSIVE METABOLIC PANEL (CC13)
ALT: 96 U/L — ABNORMAL HIGH (ref 0–55)
AST: 72 U/L — ABNORMAL HIGH (ref 5–34)
Albumin: 3.8 g/dL (ref 3.5–5.0)
Alkaline Phosphatase: 90 U/L (ref 40–150)
BUN: 8 mg/dL (ref 7.0–26.0)
CO2: 26 mEq/L (ref 22–29)
Calcium: 9.4 mg/dL (ref 8.4–10.4)
Chloride: 104 mEq/L (ref 98–107)
Creatinine: 0.6 mg/dL (ref 0.6–1.1)
Glucose: 112 mg/dl — ABNORMAL HIGH (ref 70–99)
Potassium: 3.7 mEq/L (ref 3.5–5.1)
Sodium: 140 mEq/L (ref 136–145)
Total Bilirubin: 0.9 mg/dL (ref 0.20–1.20)
Total Protein: 7.3 g/dL (ref 6.4–8.3)

## 2012-08-20 MED ORDER — HEPARIN SOD (PORK) LOCK FLUSH 100 UNIT/ML IV SOLN
500.0000 [IU] | Freq: Once | INTRAVENOUS | Status: AC
  Administered 2012-08-20: 500 [IU] via INTRAVENOUS
  Filled 2012-08-20: qty 5

## 2012-08-20 MED ORDER — SODIUM CHLORIDE 0.9 % IJ SOLN
10.0000 mL | INTRAMUSCULAR | Status: DC | PRN
  Administered 2012-08-20: 10 mL via INTRAVENOUS
  Filled 2012-08-20: qty 10

## 2012-08-20 NOTE — Telephone Encounter (Signed)
Gave patient app for 09/17/12 ML with labs

## 2012-08-20 NOTE — Patient Instructions (Addendum)
Continue on Tarceva 150 mg by mouth daily Notify our office of excessive episodes of diarrhea or severe rash Follow up in one month

## 2012-08-20 NOTE — Progress Notes (Signed)
[facility] Telephone:(336) [phone]   Fax:([phone]  OFFICE PROGRESS NOTE  PRINCIPAL DIAGNOSIS: Local recurrence of non-small cell lung cancer, adenocarcinoma, initially diagnosed as stage IB (T2a N0 M0) in March of 2010.   PRIOR THERAPY:  1. Status post right lower lobectomy under the care of Dr. Laneta Simmers on 04/08/2009. 2. Status post palliative radiotherapy to the right lower lobe recurrent lung mass under the care of Dr. Mitzi Hansen, completed on June 08, 2011. 3. Systemic chemotherapy with carboplatin for AUC of 5 and Alimta 500 mg/M2 every 3 weeks. She is status post 3 cycles.  CURRENT THERAPY:  Tarceva 150 mg by mouth daily, therapy beginning 07/24/2012. Status post approximately one month of therapy   INTERVAL HISTORY: [name] 51 y.o. female returns to the clinic today for followup visit accompanied by her son. The patient is tolerating  the Tarceva therapy without difficulty. She continues to have some mild diarrhea but not to the point where she needs to take any Imodium. Her rash has increased slightly now involving the floor head cheeks and chin. She voiced no other complaints today. She does not require any prescription refills today. She denied having any significant fever or chills, no nausea or vomiting. The patient denied having any significant chest pain, shortness breath, cough or hemoptysis. She will have her Port-A-Cath flush today.  MEDICAL HISTORY: Past Medical History  Diagnosis Date  . Lung cancer     right lower lobe adenocarcinoma  . History of radiation therapy 05/02/11 to 06/08/11    right lung    ALLERGIES:  is allergic to codeine.  MEDICATIONS:  Current Outpatient Prescriptions  Medication Sig Dispense Refill  . acetaminophen (TYLENOL) 325 MG tablet Take 650 mg by mouth every 6 (six) hours as needed. For pain      . benzonatate (TESSALON) 100 MG capsule 1 or 2 by mouth every eight hours as needed for cough  30 capsule  2  .  chlorpheniramine-HYDROcodone (TUSSIONEX) 10-8 MG/5ML LQCR Take 5 mLs by mouth every 12 (twelve) hours as needed. For cough      . dexamethasone (DECADRON) 4 MG tablet Take 4 mg by mouth 2 (two) times daily. 1 tab PO twice daily day before, day of, and day after chemotherapy      . folic acid (FOLVITE) 1 MG tablet Take 1 tablet (1 mg total) by mouth daily.  30 tablet  3  . HYDROcodone-acetaminophen (VICODIN) 5-500 MG per tablet       . ibuprofen (ADVIL,MOTRIN) 100 MG tablet Take 200 mg by mouth every 6 (six) hours as needed. For pain      . ipratropium (ATROVENT) 0.03 % nasal spray Place 1 spray into the nose 2 (two) times daily.       Marland Kitchen lidocaine-prilocaine (EMLA) cream Apply to Port-A-Cath prior to tx PRN  5 g  prn 1 year  . methylPREDNIsolone (MEDROL DOSPACK) 4 MG tablet       . Multiple Vitamin (MULTIVITAMIN) tablet Take 1 tablet by mouth daily.        . prochlorperazine (COMPAZINE) 10 MG tablet Take 10 mg by mouth every 6 (six) hours as needed. For nausea      . TARCEVA 150 MG tablet        No current facility-administered medications for this visit.   Facility-Administered Medications Ordered in Other Visits  Medication Dose Route Frequency Provider Last Rate Last Dose  . heparin lock flush 100 unit/mL  500 Units  Intravenous Once Si Gaul, MD   500 Units at 08/20/12 1412  . sodium chloride 0.9 % injection 10 mL  10 mL Intravenous PRN Si Gaul, MD   10 mL at 08/20/12 1412    SURGICAL HISTORY:  Past Surgical History  Procedure Date  . Lung lobectomy 04/18/2009    RLL  . Portacath placement 02/29/2012    Procedure: INSERTION PORT-A-CATH;  Surgeon: Ines Bloomer, MD;  Location: [facility] OR;  Service: Thoracic;  Laterality: Left;  PowerPort 8 F attachable in left internal jugular.    REVIEW OF SYSTEMS:  A comprehensive review of systems was negative except for: Gastrointestinal: positive for diarrhea Mild skin rash   PHYSICAL EXAMINATION: General appearance: alert, cooperative  and no distress Head: Normocephalic, without obvious abnormality, atraumatic Neck: no adenopathy Lymph nodes: Cervical, supraclavicular, and axillary nodes normal. Resp: clear to auscultation bilaterally Cardio: regular rate and rhythm, S1, S2 normal, no murmur, click, rub or gallop GI: soft, non-tender; bowel sounds normal; no masses,  no organomegaly Extremities: extremities normal, atraumatic, no cyanosis or edema Neurologic: Alert and oriented X 3, normal strength and tone. Normal symmetric reflexes. Normal coordination and gait Skin: Mild erythema and dryness and punctate acneiform lesions about the floor head, chin and cheeks on the face, no evidence of superinfection  ECOG PERFORMANCE STATUS: 1 - Symptomatic but completely ambulatory  Blood pressure 115/82, pulse 100, temperature 98.9 F (37.2 C), temperature source Oral, resp. rate 20, height 5' (1.524 m), weight 133 lb 8 oz (60.555 kg).  LABORATORY DATA: Lab Results  Component Value Date   WBC 5.5 08/20/2012   HGB 13.7 08/20/2012   HCT 42.0 08/20/2012   MCV 86.8 08/20/2012   PLT 217 08/20/2012      Chemistry      Component Value Date/Time   NA 138 08/06/2012 1435   NA 137 07/12/2012 1121   NA 141 01/06/2012 0804   K 4.0 08/06/2012 1435   K 3.9 07/12/2012 1121   K 4.6 01/06/2012 0804   CL 103 08/06/2012 1435   CL 104 07/12/2012 1121   CL 98 01/06/2012 0804   CO2 26 08/06/2012 1435   CO2 24 07/12/2012 1121   CO2 28 01/06/2012 0804   BUN 13.0 08/06/2012 1435   BUN 8 07/12/2012 1121   BUN 10 01/06/2012 0804   CREATININE 0.7 08/06/2012 1435   CREATININE 0.45* 07/12/2012 1121   CREATININE 0.6 01/06/2012 0804      Component Value Date/Time   CALCIUM 9.5 08/06/2012 1435   CALCIUM 8.6 07/12/2012 1121   CALCIUM 8.9 01/06/2012 0804   ALKPHOS 104 08/06/2012 1435   ALKPHOS 93 07/12/2012 1121   ALKPHOS 64 01/06/2012 0804   AST 60* 08/06/2012 1435   AST 44* 07/12/2012 1121   AST 27 01/06/2012 0804   ALT 74* 08/06/2012 1435   ALT 49* 07/12/2012 1121   BILITOT 0.80  08/06/2012 1435   BILITOT 0.3 07/12/2012 1121   BILITOT 0.60 01/06/2012 0804       RADIOGRAPHIC STUDIES: Ct Chest W Contrast  07/06/2012  *RADIOLOGY REPORT*  Clinical Data: History of right lower lobectomy for lung cancer (adenocarcinoma) in 2010, with local recurrence for which the patient has undergone radiation therapy, completed 06/08/2011, currently undergoing chemotherapy.  CT CHEST WITH CONTRAST 07/06/2012:  Technique:  Multidetector CT imaging of the chest was performed following the standard protocol during bolus administration of intravenous contrast.  Contrast: 80mL OMNIPAQUE IOHEXOL 300 MG/ML.  Comparison: CT chest 05/07/2012, 01/06/2012.  PET CT  01/16/2012.  Findings: No significant change in the partially loculated right pleural effusion, moderate in size, since the 05/07/2012 examination.  Interval increase in size of the mass in the right upper lobe posteriorly which has become more conglomerate and now measures approximately 1.7 x 3.4 cm (series 7, image 13).  The mass extends inferiorly to the level of the carina with a superoinferior measurement of 3.5 cm.  No new pulmonary parenchymal nodules or masses.  Post radiation pneumonitis/fibrosis and bronchiectasis in the medial right upper and middle lobe.  No left pleural effusion.  Heart size normal.  No pericardial effusion.  No visible coronary artery calcification.  No visible atherosclerosis involving the thoracic or upper abdominal aorta or their visualized branches.  No new or enlarging mediastinal, hilar, or axillary lymphadenopathy.  Visualized thyroid gland unremarkable.  Left subclavian Port-A-Cath catheter tip at the cavoatrial junction.  Diffuse hepatic steatosis with scattered areas of focal sparing, including the gallbladder fossa.  Remaining visualized upper abdomen unremarkable.  Bone window images unremarkable.  IMPRESSION:  1.  Interval increase in size of the mass in the posterior right upper lobe, measured above, since the  05/07/2012 examination. 2.  Stable right pleural effusion which is partially loculated. 3.  Postradiation pneumonitis/fibrosis and bronchiectasis in the medial right upper and middle lobes. 4.  No new pulmonary parenchymal nodules or masses. 5.  No new or enlarging lymphadenopathy. 6.  Diffuse hepatic steatosis with focal sparing in the gallbladder fossa.  Original Report Authenticated By: Arnell Sieving, M.D.    ASSESSMENT/PLAN: 51 years old Asian female with recurrent non-small cell lung cancer most recently treated with 3 cycles of systemic chemotherapy with carboplatin and Alimta but unfortunately the patient has some evidence for disease progression in the posterior right upper lobe mass. Patient was discussed with Dr. Arbutus Ped. She will continue on Tarceva 150 mg by mouth daily. She'll return in one month with another CBC differential and C. met for a symptom management visit.Marland KitchenShe'll have her Port-A-Cath flushed today and will be due again to have a Port-A-Cath flush in late November 2013.   Laural Benes, Sarahbeth Cashin E, PA-C   All questions were answered. The patient knows to call the clinic with any problems, questions or concerns. We can certainly see the patient much sooner if necessary.  I spent 20 minutes counseling the patient face to face. The total time spent in the appointment was 30 minutes.

## 2012-08-22 ENCOUNTER — Other Ambulatory Visit: Payer: Self-pay | Admitting: Family Medicine

## 2012-08-22 DIAGNOSIS — Z1231 Encounter for screening mammogram for malignant neoplasm of breast: Secondary | ICD-10-CM

## 2012-09-06 ENCOUNTER — Ambulatory Visit
Admission: RE | Admit: 2012-09-06 | Discharge: 2012-09-06 | Disposition: A | Discharge status: Home / Self Care | Payer: 59 | Source: Ambulatory Visit | Attending: Family Medicine | Admitting: Family Medicine

## 2012-09-06 DIAGNOSIS — Z1231 Encounter for screening mammogram for malignant neoplasm of breast: Secondary | ICD-10-CM

## 2012-09-17 ENCOUNTER — Ambulatory Visit (HOSPITAL_BASED_OUTPATIENT_CLINIC_OR_DEPARTMENT_OTHER): Payer: 59 | Admitting: Physician Assistant

## 2012-09-17 ENCOUNTER — Other Ambulatory Visit (HOSPITAL_BASED_OUTPATIENT_CLINIC_OR_DEPARTMENT_OTHER): Payer: 59 | Admitting: Lab

## 2012-09-17 ENCOUNTER — Encounter: Payer: Self-pay | Admitting: Physician Assistant

## 2012-09-17 ENCOUNTER — Telehealth: Payer: Self-pay | Admitting: Internal Medicine

## 2012-09-17 VITALS — BP 117/81 | HR 101 | Temp 98.0°F | Resp 20 | Ht 60.0 in | Wt 135.2 lb

## 2012-09-17 DIAGNOSIS — C3430 Malignant neoplasm of lower lobe, unspecified bronchus or lung: Secondary | ICD-10-CM

## 2012-09-17 DIAGNOSIS — C3490 Malignant neoplasm of unspecified part of unspecified bronchus or lung: Secondary | ICD-10-CM

## 2012-09-17 DIAGNOSIS — C3410 Malignant neoplasm of upper lobe, unspecified bronchus or lung: Secondary | ICD-10-CM

## 2012-09-17 LAB — CBC WITH DIFFERENTIAL/PLATELET
BASO%: 0.2 % (ref 0.0–2.0)
Basophils Absolute: 0 10*3/uL (ref 0.0–0.1)
EOS%: 0.6 % (ref 0.0–7.0)
Eosinophils Absolute: 0 10*3/uL (ref 0.0–0.5)
HCT: 38.9 % (ref 34.8–46.6)
HGB: 13 g/dL (ref 11.6–15.9)
LYMPH%: 17.8 % (ref 14.0–49.7)
MCH: 28.6 pg (ref 25.1–34.0)
MCHC: 33.5 g/dL (ref 31.5–36.0)
MCV: 85.5 fL (ref 79.5–101.0)
MONO#: 0.4 10*3/uL (ref 0.1–0.9)
MONO%: 6.5 % (ref 0.0–14.0)
NEUT#: 4.8 10*3/uL (ref 1.5–6.5)
NEUT%: 74.9 % (ref 38.4–76.8)
Platelets: 212 10*3/uL (ref 145–400)
RBC: 4.55 10*6/uL (ref 3.70–5.45)
RDW: 14.5 % (ref 11.2–14.5)
WBC: 6.4 10*3/uL (ref 3.9–10.3)
lymph#: 1.1 10*3/uL (ref 0.9–3.3)

## 2012-09-17 LAB — COMPREHENSIVE METABOLIC PANEL (CC13)
ALT: 69 U/L — ABNORMAL HIGH (ref 0–55)
AST: 54 U/L — ABNORMAL HIGH (ref 5–34)
Albumin: 3.6 g/dL (ref 3.5–5.0)
Alkaline Phosphatase: 89 U/L (ref 40–150)
BUN: 10 mg/dL (ref 7.0–26.0)
CO2: 23 mEq/L (ref 22–29)
Calcium: 8.9 mg/dL (ref 8.4–10.4)
Chloride: 105 mEq/L (ref 98–107)
Creatinine: 0.8 mg/dL (ref 0.6–1.1)
Glucose: 174 mg/dl — ABNORMAL HIGH (ref 70–99)
Potassium: 3.8 mEq/L (ref 3.5–5.1)
Sodium: 137 mEq/L (ref 136–145)
Total Bilirubin: 0.8 mg/dL (ref 0.20–1.20)
Total Protein: 6.7 g/dL (ref 6.4–8.3)

## 2012-09-17 NOTE — Patient Instructions (Addendum)
Continue taking Tarceva 150 mg by mouth daily Followup with Dr. Arbutus Ped in one month with a restaging CT scan of your chest abdomen and pelvis to reevaluate her disease. You'll have a Port-A-Cath flush when you return in one month

## 2012-09-17 NOTE — Telephone Encounter (Signed)
gv pt son appt schedule for November including ct scan.

## 2012-09-18 NOTE — Progress Notes (Signed)
[name] Telephone:(336) [phone]   Fax:(336) [phone]  OFFICE PROGRESS NOTE  PRINCIPAL DIAGNOSIS: Local recurrence of non-small cell lung cancer, adenocarcinoma, initially diagnosed as stage IB (T2a N0 M0) in March of 2010.   PRIOR THERAPY:  1. Status post right lower lobectomy under the care of Dr. Laneta Simmers on 04/08/2009. 2. Status post palliative radiotherapy to the right lower lobe recurrent lung mass under the care of Dr. Mitzi Hansen, completed on June 08, 2011. 3. Systemic chemotherapy with carboplatin for AUC of 5 and Alimta 500 mg/M2 every 3 weeks. She is status post 3 cycles.  CURRENT THERAPY:  Tarceva 150 mg by mouth daily, therapy beginning 07/24/2012. Status post approximately two months of therapy   INTERVAL HISTORY: [name] 51 y.o. female returns to the clinic today for followup visit accompanied by her son. The patient is tolerating  the Tarceva therapy without difficulty. She continues to have some mild diarrhea but not to the point where she needs to take any Imodium. Her rash has calmed down. She complains of her lower back being stiff when she is walking. She denied any numbness or tingling into the lower extremities, no loss of bladder or bowel control.  She does not require any prescription refills today. She denied having any significant fever or chills, no nausea or vomiting. The patient denied having any significant chest pain, shortness breath, cough or hemoptysis.   MEDICAL HISTORY: Past Medical History  Diagnosis Date  . Lung cancer     right lower lobe adenocarcinoma  . History of radiation therapy 05/02/11 to 06/08/11    right lung    ALLERGIES:  is allergic to codeine.  MEDICATIONS:  Current Outpatient Prescriptions  Medication Sig Dispense Refill  . acetaminophen (TYLENOL) 325 MG tablet Take 650 mg by mouth every 6 (six) hours as needed. For pain      . benzonatate (TESSALON) 100 MG capsule 1 or 2 by mouth every eight hours as needed for  cough  30 capsule  2  . chlorpheniramine-HYDROcodone (TUSSIONEX) 10-8 MG/5ML LQCR Take 5 mLs by mouth every 12 (twelve) hours as needed. For cough      . dexamethasone (DECADRON) 4 MG tablet Take 4 mg by mouth 2 (two) times daily. 1 tab PO twice daily day before, day of, and day after chemotherapy      . folic acid (FOLVITE) 1 MG tablet Take 1 tablet (1 mg total) by mouth daily.  30 tablet  3  . HYDROcodone-acetaminophen (VICODIN) 5-500 MG per tablet       . ibuprofen (ADVIL,MOTRIN) 100 MG tablet Take 200 mg by mouth every 6 (six) hours as needed. For pain      . ipratropium (ATROVENT) 0.03 % nasal spray Place 1 spray into the nose 2 (two) times daily.       Marland Kitchen lidocaine-prilocaine (EMLA) cream Apply to Port-A-Cath prior to tx PRN  5 g  prn 1 year  . methylPREDNIsolone (MEDROL DOSPACK) 4 MG tablet       . Multiple Vitamin (MULTIVITAMIN) tablet Take 1 tablet by mouth daily.        . prochlorperazine (COMPAZINE) 10 MG tablet Take 10 mg by mouth every 6 (six) hours as needed. For nausea      . TARCEVA 150 MG tablet         SURGICAL HISTORY:  Past Surgical History  Procedure Date  . Lung lobectomy 04/18/2009    RLL  . Portacath placement 02/29/2012  Procedure: INSERTION PORT-A-CATH;  Surgeon: Ines Bloomer, MD;  Location: [facility] OR;  Service: Thoracic;  Laterality: Left;  PowerPort 8 F attachable in left internal jugular.    REVIEW OF SYSTEMS:  A comprehensive review of systems was negative except for: Gastrointestinal: positive for diarrhea Musculoskeletal: positive for back pain Mild skin rash   PHYSICAL EXAMINATION: General appearance: alert, cooperative and no distress Head: Normocephalic, without obvious abnormality, atraumatic Neck: no adenopathy Lymph nodes: Cervical, supraclavicular, and axillary nodes normal. Resp: clear to auscultation bilaterally Cardio: regular rate and rhythm, S1, S2 normal, no murmur, click, rub or gallop GI: soft, non-tender; bowel sounds normal; no masses,   no organomegaly Extremities: extremities normal, atraumatic, no cyanosis or edema Neurologic: Alert and oriented X 3, normal strength and tone. Normal symmetric reflexes. Normal coordination and gait Skin: Mild erythema and dryness and punctate acneiform lesions about the chin and cheeks on the face, no evidence of superinfection  ECOG PERFORMANCE STATUS: 1 - Symptomatic but completely ambulatory  Blood pressure 117/81, pulse 101, temperature 98 F (36.7 C), temperature source Oral, resp. rate 20, height 5' (1.524 m), weight 135 lb 3.2 oz (61.326 kg).  LABORATORY DATA: Lab Results  Component Value Date   WBC 6.4 09/17/2012   HGB 13.0 09/17/2012   HCT 38.9 09/17/2012   MCV 85.5 09/17/2012   PLT 212 09/17/2012      Chemistry      Component Value Date/Time   NA 137 09/17/2012 1323   NA 137 07/12/2012 1121   NA 141 01/06/2012 0804   K 3.8 09/17/2012 1323   K 3.9 07/12/2012 1121   K 4.6 01/06/2012 0804   CL 105 09/17/2012 1323   CL 104 07/12/2012 1121   CL 98 01/06/2012 0804   CO2 23 09/17/2012 1323   CO2 24 07/12/2012 1121   CO2 28 01/06/2012 0804   BUN 10.0 09/17/2012 1323   BUN 8 07/12/2012 1121   BUN 10 01/06/2012 0804   CREATININE 0.8 09/17/2012 1323   CREATININE 0.45* 07/12/2012 1121   CREATININE 0.6 01/06/2012 0804      Component Value Date/Time   CALCIUM 8.9 09/17/2012 1323   CALCIUM 8.6 07/12/2012 1121   CALCIUM 8.9 01/06/2012 0804   ALKPHOS 89 09/17/2012 1323   ALKPHOS 93 07/12/2012 1121   ALKPHOS 64 01/06/2012 0804   AST 54* 09/17/2012 1323   AST 44* 07/12/2012 1121   AST 27 01/06/2012 0804   ALT 69* 09/17/2012 1323   ALT 49* 07/12/2012 1121   BILITOT 0.80 09/17/2012 1323   BILITOT 0.3 07/12/2012 1121   BILITOT 0.60 01/06/2012 0804       RADIOGRAPHIC STUDIES: Ct Chest W Contrast  07/06/2012  *RADIOLOGY REPORT*  Clinical Data: History of right lower lobectomy for lung cancer (adenocarcinoma) in 2010, with local recurrence for which the patient has undergone radiation therapy,  completed 06/08/2011, currently undergoing chemotherapy.  CT CHEST WITH CONTRAST 07/06/2012:  Technique:  Multidetector CT imaging of the chest was performed following the standard protocol during bolus administration of intravenous contrast.  Contrast: 80mL OMNIPAQUE IOHEXOL 300 MG/ML.  Comparison: CT chest 05/07/2012, 01/06/2012.  PET CT 01/16/2012.  Findings: No significant change in the partially loculated right pleural effusion, moderate in size, since the 05/07/2012 examination.  Interval increase in size of the mass in the right upper lobe posteriorly which has become more conglomerate and now measures approximately 1.7 x 3.4 cm (series 7, image 13).  The mass extends inferiorly to the level of the carina  with a superoinferior measurement of 3.5 cm.  No new pulmonary parenchymal nodules or masses.  Post radiation pneumonitis/fibrosis and bronchiectasis in the medial right upper and middle lobe.  No left pleural effusion.  Heart size normal.  No pericardial effusion.  No visible coronary artery calcification.  No visible atherosclerosis involving the thoracic or upper abdominal aorta or their visualized branches.  No new or enlarging mediastinal, hilar, or axillary lymphadenopathy.  Visualized thyroid gland unremarkable.  Left subclavian Port-A-Cath catheter tip at the cavoatrial junction.  Diffuse hepatic steatosis with scattered areas of focal sparing, including the gallbladder fossa.  Remaining visualized upper abdomen unremarkable.  Bone window images unremarkable.  IMPRESSION:  1.  Interval increase in size of the mass in the posterior right upper lobe, measured above, since the 05/07/2012 examination. 2.  Stable right pleural effusion which is partially loculated. 3.  Postradiation pneumonitis/fibrosis and bronchiectasis in the medial right upper and middle lobes. 4.  No new pulmonary parenchymal nodules or masses. 5.  No new or enlarging lymphadenopathy. 6.  Diffuse hepatic steatosis with focal sparing  in the gallbladder fossa.  Original Report Authenticated By: Arnell Sieving, M.D.    ASSESSMENT/PLAN: 51 years old Asian female with recurrent non-small cell lung cancer most recently treated with 3 cycles of systemic chemotherapy with carboplatin and Alimta but unfortunately the patient has some evidence for disease progression in the posterior right upper lobe mass. Patient was discussed with Dr. Arbutus Ped. She will continue on Tarceva 150 mg by mouth daily. He will follow up with Dr. Arbutus Ped in one month with repeat CBC differential, C. met and CT of the chest abdomen and pelvis with contrast to reevaluate her disease. She will be due to have her Port-A-Cath flush when she returns in one month as well.   Laural Benes, Naome Brigandi E, PA-C   All questions were answered. The patient knows to call the clinic with any problems, questions or concerns. We can certainly see the patient much sooner if necessary.  I spent 20 minutes counseling the patient face to face. The total time spent in the appointment was 30 minutes.

## 2012-09-26 ENCOUNTER — Other Ambulatory Visit: Payer: Self-pay | Admitting: *Deleted

## 2012-09-26 MED ORDER — ERLOTINIB HCL 150 MG PO TABS: 150.0000 mg | ORAL_TABLET | Freq: Every day | ORAL | Status: DC

## 2012-10-11 ENCOUNTER — Other Ambulatory Visit: Payer: Self-pay | Admitting: Medical Oncology

## 2012-10-11 DIAGNOSIS — C3490 Malignant neoplasm of unspecified part of unspecified bronchus or lung: Secondary | ICD-10-CM

## 2012-10-12 ENCOUNTER — Encounter: Payer: Self-pay | Admitting: Internal Medicine

## 2012-10-12 ENCOUNTER — Ambulatory Visit (HOSPITAL_COMMUNITY)
Admission: RE | Admit: 2012-10-12 | Discharge: 2012-10-12 | Disposition: A | Discharge status: Home / Self Care | Payer: 59 | Source: Ambulatory Visit | Attending: Physician Assistant | Admitting: Physician Assistant

## 2012-10-12 ENCOUNTER — Ambulatory Visit (HOSPITAL_COMMUNITY): Payer: 59

## 2012-10-12 DIAGNOSIS — C3490 Malignant neoplasm of unspecified part of unspecified bronchus or lung: Secondary | ICD-10-CM | POA: Insufficient documentation

## 2012-10-12 DIAGNOSIS — R1900 Intra-abdominal and pelvic swelling, mass and lump, unspecified site: Secondary | ICD-10-CM | POA: Insufficient documentation

## 2012-10-12 DIAGNOSIS — K7689 Other specified diseases of liver: Secondary | ICD-10-CM | POA: Insufficient documentation

## 2012-10-12 MED ORDER — IOHEXOL 300 MG/ML  SOLN
100.0000 mL | Freq: Once | INTRAMUSCULAR | Status: AC | PRN
  Administered 2012-10-12: 100 mL via INTRAVENOUS

## 2012-10-12 NOTE — Progress Notes (Signed)
10/12/2012  I spoke with Sedalia Muta, RN and have cancelled pet scan scheduled for Monday, 10/15/2012.  Patient will have chest, abd and pelvis ct scan today.  Chest Ct Scan was approved.   Bonita Quin  [id number]

## 2012-10-15 ENCOUNTER — Other Ambulatory Visit (HOSPITAL_BASED_OUTPATIENT_CLINIC_OR_DEPARTMENT_OTHER): Payer: 59

## 2012-10-15 ENCOUNTER — Ambulatory Visit: Payer: 59

## 2012-10-15 ENCOUNTER — Ambulatory Visit (HOSPITAL_BASED_OUTPATIENT_CLINIC_OR_DEPARTMENT_OTHER): Payer: 59 | Admitting: Internal Medicine

## 2012-10-15 ENCOUNTER — Telehealth: Payer: Self-pay | Admitting: Internal Medicine

## 2012-10-15 VITALS — BP 117/80 | HR 96 | Temp 98.4°F | Resp 18 | Ht 60.0 in | Wt 131.6 lb

## 2012-10-15 DIAGNOSIS — C3490 Malignant neoplasm of unspecified part of unspecified bronchus or lung: Secondary | ICD-10-CM

## 2012-10-15 DIAGNOSIS — C3430 Malignant neoplasm of lower lobe, unspecified bronchus or lung: Secondary | ICD-10-CM

## 2012-10-15 DIAGNOSIS — R21 Rash and other nonspecific skin eruption: Secondary | ICD-10-CM

## 2012-10-15 DIAGNOSIS — C3410 Malignant neoplasm of upper lobe, unspecified bronchus or lung: Secondary | ICD-10-CM

## 2012-10-15 LAB — COMPREHENSIVE METABOLIC PANEL (CC13)
ALT: 33 U/L (ref 0–55)
AST: 36 U/L — ABNORMAL HIGH (ref 5–34)
Albumin: 3.6 g/dL (ref 3.5–5.0)
Alkaline Phosphatase: 90 U/L (ref 40–150)
BUN: 9 mg/dL (ref 7.0–26.0)
CO2: 27 mEq/L (ref 22–29)
Calcium: 9.2 mg/dL (ref 8.4–10.4)
Chloride: 105 mEq/L (ref 98–107)
Creatinine: 0.7 mg/dL (ref 0.6–1.1)
Glucose: 156 mg/dl — ABNORMAL HIGH (ref 70–99)
Potassium: 3.6 mEq/L (ref 3.5–5.1)
Sodium: 137 mEq/L (ref 136–145)
Total Bilirubin: 0.61 mg/dL (ref 0.20–1.20)
Total Protein: 7 g/dL (ref 6.4–8.3)

## 2012-10-15 LAB — CBC WITH DIFFERENTIAL/PLATELET
BASO%: 0.3 % (ref 0.0–2.0)
Basophils Absolute: 0 10*3/uL (ref 0.0–0.1)
EOS%: 0.7 % (ref 0.0–7.0)
Eosinophils Absolute: 0 10*3/uL (ref 0.0–0.5)
HCT: 38.4 % (ref 34.8–46.6)
HGB: 12.3 g/dL (ref 11.6–15.9)
LYMPH%: 20.1 % (ref 14.0–49.7)
MCH: 26.7 pg (ref 25.1–34.0)
MCHC: 32 g/dL (ref 31.5–36.0)
MCV: 83.3 fL (ref 79.5–101.0)
MONO#: 0.4 10*3/uL (ref 0.1–0.9)
MONO%: 7.3 % (ref 0.0–14.0)
NEUT#: 4.3 10*3/uL (ref 1.5–6.5)
NEUT%: 71.6 % (ref 38.4–76.8)
Platelets: 298 10*3/uL (ref 145–400)
RBC: 4.61 10*6/uL (ref 3.70–5.45)
RDW: 14.3 % (ref 11.2–14.5)
WBC: 5.9 10*3/uL (ref 3.9–10.3)
lymph#: 1.2 10*3/uL (ref 0.9–3.3)
nRBC: 0 % (ref 0–0)

## 2012-10-15 MED ORDER — HEPARIN SOD (PORK) LOCK FLUSH 100 UNIT/ML IV SOLN
500.0000 [IU] | Freq: Once | INTRAVENOUS | Status: AC
  Administered 2012-10-15: 500 [IU] via INTRAVENOUS
  Filled 2012-10-15: qty 5

## 2012-10-15 MED ORDER — SODIUM CHLORIDE 0.9 % IJ SOLN
10.0000 mL | INTRAMUSCULAR | Status: DC | PRN
  Administered 2012-10-15: 10 mL via INTRAVENOUS
  Filled 2012-10-15: qty 10

## 2012-10-15 NOTE — Progress Notes (Signed)
[facility] [phone]   [phone]  OFFICE PROGRESS NOTE  PRINCIPAL DIAGNOSIS: Local recurrence of non-small cell lung cancer, adenocarcinoma, initially diagnosed as stage IB (T2a N0 M0) in March of 2010.   PRIOR THERAPY:  1. Status post right lower lobectomy under the care of Dr. Laneta Simmers on 04/08/2009. 2. Status post palliative radiotherapy to the right lower lobe recurrent lung mass under the care of Dr. Mitzi Hansen, completed on June 08, 2011. 3. Systemic chemotherapy with carboplatin for AUC of 5 and Alimta 500 mg/M2 every 3 weeks. She is status post 3 cycles.  CURRENT THERAPY: Tarceva 150 mg by mouth daily, therapy beginning 07/24/2012. Status post approximately 3 months of therapy.  INTERVAL HISTORY: [name] 51 y.o. female returns to the clinic today for followup visit accompanied by her son. The patient is feeling fine today with no specific complaints except for macular skin rash at the left cubical area as well as the back of the knee bilaterally. She also has occasional diarrhea every now and then but no significant skin rash on the face, neck or back. She is tolerating her treatment was Tarceva fairly well with no significant adverse effects except as mentioned above. She denied having any significant weight loss or night sweats. She denied having any chest pain, shortness breath, cough or hemoptysis.   MEDICAL HISTORY: Past Medical History  Diagnosis Date  . Lung cancer     right lower lobe adenocarcinoma  . History of radiation therapy 05/02/11 to 06/08/11    right lung    ALLERGIES:  is allergic to codeine.  MEDICATIONS:  Current Outpatient Prescriptions  Medication Sig Dispense Refill  . acetaminophen (TYLENOL) 325 MG tablet Take 650 mg by mouth every 6 (six) hours as needed. For pain      . benzonatate (TESSALON) 100 MG capsule 1 or 2 by mouth every eight hours as needed for cough  30 capsule  2  . chlorpheniramine-HYDROcodone  (TUSSIONEX) 10-8 MG/5ML LQCR Take 5 mLs by mouth every 12 (twelve) hours as needed. For cough      . dexamethasone (DECADRON) 4 MG tablet Take 4 mg by mouth 2 (two) times daily. 1 tab PO twice daily day before, day of, and day after chemotherapy      . erlotinib (TARCEVA) 150 MG tablet Take 1 tablet (150 mg total) by mouth daily.  30 tablet  0  . folic acid (FOLVITE) 1 MG tablet Take 1 tablet (1 mg total) by mouth daily.  30 tablet  3  . HYDROcodone-acetaminophen (VICODIN) 5-500 MG per tablet       . ibuprofen (ADVIL,MOTRIN) 100 MG tablet Take 200 mg by mouth every 6 (six) hours as needed. For pain      . ipratropium (ATROVENT) 0.03 % nasal spray Place 1 spray into the nose 2 (two) times daily.       Marland Kitchen lidocaine-prilocaine (EMLA) cream Apply to Port-A-Cath prior to tx PRN  5 g  prn 1 year  . methylPREDNIsolone (MEDROL DOSPACK) 4 MG tablet       . Multiple Vitamin (MULTIVITAMIN) tablet Take 1 tablet by mouth daily.        . prochlorperazine (COMPAZINE) 10 MG tablet Take 10 mg by mouth every 6 (six) hours as needed. For nausea       No current facility-administered medications for this visit.   Facility-Administered Medications Ordered in Other Visits  Medication Dose Route Frequency Provider Last Rate Last Dose  . [  COMPLETED] heparin lock flush 100 unit/mL  500 Units Intravenous Once Si Gaul, MD   500 Units at 10/15/12 1148  . [DISCONTINUED] sodium chloride 0.9 % injection 10 mL  10 mL Intravenous PRN Si Gaul, MD   10 mL at 10/15/12 1148    SURGICAL HISTORY:  Past Surgical History  Procedure Date  . Lung lobectomy 04/18/2009    RLL  . Portacath placement 02/29/2012    Procedure: INSERTION PORT-A-CATH;  Surgeon: Ines Bloomer, MD;  Location: [facility] OR;  Service: Thoracic;  Laterality: Left;  PowerPort 8 F attachable in left internal jugular.    REVIEW OF SYSTEMS:  A comprehensive review of systems was negative except for: Gastrointestinal: positive for diarrhea Patchy skin rash  at the left cubical area as well as the back of the knee bilaterally   PHYSICAL EXAMINATION: General appearance: alert, cooperative and no distress Head: Normocephalic, without obvious abnormality, atraumatic Neck: no adenopathy Resp: clear to auscultation bilaterally Cardio: regular rate and rhythm, S1, S2 normal, no murmur, click, rub or gallop GI: soft, non-tender; bowel sounds normal; no masses,  no organomegaly Extremities: extremities normal, atraumatic, no cyanosis or edema Neurologic: Alert and oriented X 3, normal strength and tone. Normal symmetric reflexes. Normal coordination and gait Skin exam: Large macular skin rash at the left cubical area as well as the back of the knee bilaterally  ECOG PERFORMANCE STATUS: 1 - Symptomatic but completely ambulatory  Blood pressure 117/80, pulse 96, temperature 98.4 F (36.9 C), temperature source Oral, resp. rate 18, height 5' (1.524 m), weight 131 lb 9.6 oz (59.693 kg).  LABORATORY DATA: Lab Results  Component Value Date   WBC 5.9 10/15/2012   HGB 12.3 10/15/2012   HCT 38.4 10/15/2012   MCV 83.3 10/15/2012   PLT 298 10/15/2012      Chemistry      Component Value Date/Time   NA 137 09/17/2012 1323   NA 137 07/12/2012 1121   NA 141 01/06/2012 0804   K 3.8 09/17/2012 1323   K 3.9 07/12/2012 1121   K 4.6 01/06/2012 0804   CL 105 09/17/2012 1323   CL 104 07/12/2012 1121   CL 98 01/06/2012 0804   CO2 23 09/17/2012 1323   CO2 24 07/12/2012 1121   CO2 28 01/06/2012 0804   BUN 10.0 09/17/2012 1323   BUN 8 07/12/2012 1121   BUN 10 01/06/2012 0804   CREATININE 0.8 09/17/2012 1323   CREATININE 0.45* 07/12/2012 1121   CREATININE 0.6 01/06/2012 0804      Component Value Date/Time   CALCIUM 8.9 09/17/2012 1323   CALCIUM 8.6 07/12/2012 1121   CALCIUM 8.9 01/06/2012 0804   ALKPHOS 89 09/17/2012 1323   ALKPHOS 93 07/12/2012 1121   ALKPHOS 64 01/06/2012 0804   AST 54* 09/17/2012 1323   AST 44* 07/12/2012 1121   AST 27 01/06/2012 0804   ALT 69*  09/17/2012 1323   ALT 49* 07/12/2012 1121   BILITOT 0.80 09/17/2012 1323   BILITOT 0.3 07/12/2012 1121   BILITOT 0.60 01/06/2012 0804       RADIOGRAPHIC STUDIES: Ct Chest W Contrast  10/12/2012  *RADIOLOGY REPORT*  Clinical Data:  Restaging metastatic lung cancer.  Undergoing oral chemotherapy.  CT CHEST, ABDOMEN AND PELVIS WITH CONTRAST  Technique:  Multidetector CT imaging of the chest, abdomen and pelvis was performed following the standard protocol during bolus administration of intravenous contrast.  Contrast: OMNIPAQUE IOHEXOL 300 MG/ML  SOLN  Comparison:  Chest CT 07/06/2012.  PET CT 01/16/2012 and 03/06/2009.   CT CHEST  Findings:  Left IJ Port-A-Cath is unchanged at the SVC right atrial junction.  There are no enlarged mediastinal, hilar or axillary lymph nodes.  A small loculated right pleural effusion is unchanged.  There is no left pleural effusion or pericardial effusion.  There are stable postsurgical changes status post right lower lobe resection.  Radiation changes in the medial right lung are stable. The irregular right upper lobe mass has slightly decreased in size, measuring 3.0 x 1.3 cm on image 11 (previously 3.4 x 1.7 cm).  This abuts the visceral pleural posterior medially.  There is no adjacent osseous destruction.  No other pulmonary nodules are identified.  The left lung is clear.  There is apparent old fracture of the left clavicle.  No aggressive osseous lesions are identified.  IMPRESSION:  1.  Slight improvement in the hypermetabolic mass in the medial right upper lobe. 2.  Stable loculated right pleural effusion and radiation changes medially in the right lung. 3.  No new findings identified.   CT ABDOMEN AND PELVIS  Findings:  The liver demonstrates heterogeneous hepatic steatosis which is similar to the prior study.  No focal lesions are identified.  The gallbladder, biliary system and pancreas appear normal.  The spleen appears normal.  There is no adrenal mass.  The  right kidney appears normal.  There is a small low-density lesion in the medial left kidney which is likely a cyst.  Once again demonstrated is massive enlargement of the uterus by a large heterogeneous mass on the right.  The overall dimensions of the uterus are approximately 19.9 x 12.0 cm transverse.  Focal mass measures up to 13.8 cm cephalocaudad.  The endometrium is displaced to the left.  Multiple dilated draining ovarian vein collaterals are again noted. This pelvic mass has been demonstrated on prior studies dating back to a PET CT performed in 2010. The bladder appears unremarkable. There is a small amount of pelvic ascites.  There are no suspicious osseous findings.  IMPRESSION:  1.  No evidence of abdominal pelvic metastatic disease. 2.  Large pelvic mass contiguous with the right aspect of the uterus is presumed to reflect an atypical fibroid based on stability from multiple prior studies. 3.  Heterogeneous hepatic steatosis.   Original Report Authenticated By: Carey Bullocks, M.D.     ASSESSMENT: This is a very pleasant 51 years old Asian female with recurrent non-small cell lung cancer, adenocarcinoma. The patient is currently on treatment with Tarceva 150 mg by mouth daily for the last 3 months with no evidence for disease progression. The patient complains of new skin rash at the left cubital area as well as the back with any bilaterally suspicious for fungal infection.  PLAN: I discussed the scan results with the patient and her son. I recommended for her to continue treatment with Tarceva at the current dose. For the skin rash, this is very suspicious for fungal infection. I will refer the patient to dermatology for evaluation and treatment of her condition. She would come back for followup visit in one month's for reevaluation and management any adverse effect of her treatment with Tarceva.  All questions were answered. The patient knows to call the clinic with any problems, questions or  concerns. We can certainly see the patient much sooner if necessary.  I spent 15 minutes counseling the patient face to face. The total time spent in the appointment was 25 minutes.

## 2012-10-15 NOTE — Patient Instructions (Addendum)
Your CT scan showed no evidence for disease progression. We'll continue treatment with Tarceva at the current dose. I will refer you to dermatology for evaluation of the skin rash. Followup in one month's

## 2012-10-15 NOTE — Telephone Encounter (Signed)
appts made and printed for  Pt,pt /son aware that i will call with dr jones/derm appt       aom

## 2012-10-16 ENCOUNTER — Other Ambulatory Visit (HOSPITAL_COMMUNITY): Payer: 59

## 2012-10-18 ENCOUNTER — Telehealth: Payer: Self-pay | Admitting: Internal Medicine

## 2012-10-18 NOTE — Telephone Encounter (Signed)
called son with dr Jesusita Oka jones/derm appt which is 11/26 at 3:00 arrive at 2:40,son aware

## 2012-10-23 ENCOUNTER — Other Ambulatory Visit: Payer: Self-pay | Admitting: *Deleted

## 2012-10-23 DIAGNOSIS — C3490 Malignant neoplasm of unspecified part of unspecified bronchus or lung: Secondary | ICD-10-CM

## 2012-10-23 MED ORDER — ERLOTINIB HCL 150 MG PO TABS: 150.0000 mg | ORAL_TABLET | Freq: Every day | ORAL | Status: DC

## 2012-10-23 NOTE — Telephone Encounter (Signed)
THIS REFILL REQUEST FOR TARCEVA WAS GIVEN TO DR.[name]'S NURSE, DIANE BELL,RN. 

## 2012-10-23 NOTE — Addendum Note (Signed)
Addended by: Arvilla Meres on: 10/23/2012 12:07 PM   Modules accepted: Orders

## 2012-10-26 ENCOUNTER — Telehealth: Payer: Self-pay | Admitting: *Deleted

## 2012-10-26 NOTE — Telephone Encounter (Signed)
Received letter from Dr Yetta Barre at [facility] regarding office visit on 10/24/12.  Letter given to Dr Donnald Garre to review.  SLJ

## 2012-11-12 ENCOUNTER — Ambulatory Visit (HOSPITAL_BASED_OUTPATIENT_CLINIC_OR_DEPARTMENT_OTHER): Payer: 59 | Admitting: Physician Assistant

## 2012-11-12 ENCOUNTER — Telehealth: Payer: Self-pay | Admitting: Internal Medicine

## 2012-11-12 ENCOUNTER — Other Ambulatory Visit (HOSPITAL_BASED_OUTPATIENT_CLINIC_OR_DEPARTMENT_OTHER): Payer: 59 | Admitting: Lab

## 2012-11-12 ENCOUNTER — Encounter: Payer: Self-pay | Admitting: Physician Assistant

## 2012-11-12 VITALS — BP 107/69 | HR 95 | Temp 98.3°F | Resp 18 | Ht 60.0 in | Wt 131.5 lb

## 2012-11-12 DIAGNOSIS — R21 Rash and other nonspecific skin eruption: Secondary | ICD-10-CM

## 2012-11-12 DIAGNOSIS — C3490 Malignant neoplasm of unspecified part of unspecified bronchus or lung: Secondary | ICD-10-CM

## 2012-11-12 DIAGNOSIS — C3430 Malignant neoplasm of lower lobe, unspecified bronchus or lung: Secondary | ICD-10-CM

## 2012-11-12 LAB — CBC WITH DIFFERENTIAL/PLATELET
BASO%: 0.4 % (ref 0.0–2.0)
Basophils Absolute: 0 10*3/uL (ref 0.0–0.1)
EOS%: 1.2 % (ref 0.0–7.0)
Eosinophils Absolute: 0.1 10*3/uL (ref 0.0–0.5)
HCT: 36.3 % (ref 34.8–46.6)
HGB: 12 g/dL (ref 11.6–15.9)
LYMPH%: 17 % (ref 14.0–49.7)
MCH: 26.7 pg (ref 25.1–34.0)
MCHC: 32.9 g/dL (ref 31.5–36.0)
MCV: 81.1 fL (ref 79.5–101.0)
MONO#: 0.5 10*3/uL (ref 0.1–0.9)
MONO%: 9.6 % (ref 0.0–14.0)
NEUT#: 4 10*3/uL (ref 1.5–6.5)
NEUT%: 71.8 % (ref 38.4–76.8)
Platelets: 268 10*3/uL (ref 145–400)
RBC: 4.48 10*6/uL (ref 3.70–5.45)
RDW: 15.1 % — ABNORMAL HIGH (ref 11.2–14.5)
WBC: 5.6 10*3/uL (ref 3.9–10.3)
lymph#: 0.9 10*3/uL (ref 0.9–3.3)

## 2012-11-12 LAB — COMPREHENSIVE METABOLIC PANEL (CC13)
ALT: 28 U/L (ref 0–55)
AST: 38 U/L — ABNORMAL HIGH (ref 5–34)
Albumin: 3.4 g/dL — ABNORMAL LOW (ref 3.5–5.0)
Alkaline Phosphatase: 82 U/L (ref 40–150)
BUN: 11 mg/dL (ref 7.0–26.0)
CO2: 26 mEq/L (ref 22–29)
Calcium: 8.3 mg/dL — ABNORMAL LOW (ref 8.4–10.4)
Chloride: 108 mEq/L — ABNORMAL HIGH (ref 98–107)
Creatinine: 0.6 mg/dL (ref 0.6–1.1)
Glucose: 130 mg/dl — ABNORMAL HIGH (ref 70–99)
Potassium: 3.7 mEq/L (ref 3.5–5.1)
Sodium: 140 mEq/L (ref 136–145)
Total Bilirubin: 0.61 mg/dL (ref 0.20–1.20)
Total Protein: 6.6 g/dL (ref 6.4–8.3)

## 2012-11-12 MED ORDER — HYDROCODONE-HOMATROPINE 5-1.5 MG/5ML PO SYRP: 5.0000 mL | ORAL_SOLUTION | Freq: Four times a day (QID) | ORAL | Status: DC | PRN

## 2012-11-12 NOTE — Patient Instructions (Addendum)
Continue Tarceva 150 mg by mouth daily Take the Hycodan cough syrup as prescribed for your cough Follow up in 1 month

## 2012-11-12 NOTE — Progress Notes (Signed)
[facility] Telephone:([phone]   Fax:([phone]  OFFICE PROGRESS NOTE  PRINCIPAL DIAGNOSIS: Local recurrence of non-small cell lung cancer, adenocarcinoma, initially diagnosed as stage IB (T2a N0 M0) in March of 2010.   PRIOR THERAPY:  1. Status post right lower lobectomy under the care of Dr. Laneta Simmers on 04/08/2009. 2. Status post palliative radiotherapy to the right lower lobe recurrent lung mass under the care of Dr. Mitzi Hansen, completed on June 08, 2011. 3. Systemic chemotherapy with carboplatin for AUC of 5 and Alimta 500 mg/M2 every 3 weeks. She is status post 3 cycles.  CURRENT THERAPY: Tarceva 150 mg by mouth daily, therapy beginning 07/24/2012. Status post approximately 4 months of therapy.  INTERVAL HISTORY: [name] 51 y.o. female returns to the clinic today for followup visit accompanied by her son. Patient reports that she did see the dermatologist and was given a cream to place on the skin lesions. She reports the skin lesions had begun to fade prior to her being evaluated by the dermatologist and they're now completely gone. She complains of increased cough that is dry nonproductive, not associated with fever or chills. She complains of a stuffy nose the last couple of days. She has not received any relief from the cough from her [employer]. She was no other specific complaints except for mild low back pain that is intermittent. She continues to have mild skin rash and occasional episodes of diarrhea related to the Tarceva. She is tolerating her treatment was Tarceva fairly well with no significant adverse effects except as mentioned above. She denied having any significant weight loss or night sweats. She denied having any chest pain, shortness breath, cough or hemoptysis.   MEDICAL HISTORY: Past Medical History  Diagnosis Date  . Lung cancer     right lower lobe adenocarcinoma  . History of radiation therapy 05/02/11 to 06/08/11    right lung     ALLERGIES:  is allergic to codeine.  MEDICATIONS:  Current Outpatient Prescriptions  Medication Sig Dispense Refill  . acetaminophen (TYLENOL) 325 MG tablet Take 650 mg by mouth every 6 (six) hours as needed. For pain      . benzonatate (TESSALON) 100 MG capsule 1 or 2 by mouth every eight hours as needed for cough  30 capsule  2  . chlorpheniramine-HYDROcodone (TUSSIONEX) 10-8 MG/5ML LQCR Take 5 mLs by mouth every 12 (twelve) hours as needed. For cough      . dexamethasone (DECADRON) 4 MG tablet Take 4 mg by mouth 2 (two) times daily. 1 tab PO twice daily day before, day of, and day after chemotherapy      . erlotinib (TARCEVA) 150 MG tablet Take 1 tablet (150 mg total) by mouth daily.  90 tablet  0  . fluticasone (CUTIVATE) 0.05 % cream       . folic acid (FOLVITE) 1 MG tablet Take 1 tablet (1 mg total) by mouth daily.  30 tablet  3  . HYDROcodone-acetaminophen (VICODIN) 5-500 MG per tablet       . HYDROcodone-homatropine (HYCODAN) 5-1.5 MG/5ML syrup Take 5 mLs by mouth every 6 (six) hours as needed for cough.  240 mL  0  . ibuprofen (ADVIL,MOTRIN) 100 MG tablet Take 200 mg by mouth every 6 (six) hours as needed. For pain      . ipratropium (ATROVENT) 0.03 % nasal spray Place 1 spray into the nose 2 (two) times daily.       Marland Kitchen lidocaine-prilocaine (  EMLA) cream Apply to Port-A-Cath prior to tx PRN  5 g  prn 1 year  . methylPREDNIsolone (MEDROL DOSPACK) 4 MG tablet       . Multiple Vitamin (MULTIVITAMIN) tablet Take 1 tablet by mouth daily.        . prochlorperazine (COMPAZINE) 10 MG tablet Take 10 mg by mouth every 6 (six) hours as needed. For nausea        SURGICAL HISTORY:  Past Surgical History  Procedure Date  . Lung lobectomy 04/18/2009    RLL  . Portacath placement 02/29/2012    Procedure: INSERTION PORT-A-CATH;  Surgeon: Ines Bloomer, MD;  Location: [facility] OR;  Service: Thoracic;  Laterality: Left;  PowerPort 8 F attachable in left internal jugular.    REVIEW OF SYSTEMS:   Pertinent items are noted in HPI.   PHYSICAL EXAMINATION: General appearance: alert, cooperative and no distress Head: Normocephalic, without obvious abnormality, atraumatic Neck: no adenopathy Resp: clear to auscultation bilaterally Cardio: regular rate and rhythm, S1, S2 normal, no murmur, click, rub or gallop GI: soft, non-tender; bowel sounds normal; no masses,  no organomegaly Extremities: extremities normal, atraumatic, no cyanosis or edema Neurologic: Alert and oriented X 3, normal strength and tone. Normal symmetric reflexes. Normal coordination and gait Skin exam: mild acneform eruptions on the face, primarily the cheeks. Antecubital fossa, clear of any lesions  ECOG PERFORMANCE STATUS: 1 - Symptomatic but completely ambulatory  Blood pressure 107/69, pulse 95, temperature 98.3 F (36.8 C), temperature source Oral, resp. rate 18, height 5' (1.524 m), weight 131 lb 8 oz (59.648 kg).  LABORATORY DATA: Lab Results  Component Value Date   WBC 5.6 11/12/2012   HGB 12.0 11/12/2012   HCT 36.3 11/12/2012   MCV 81.1 11/12/2012   PLT 268 11/12/2012      Chemistry      Component Value Date/Time   NA 140 11/12/2012 1448   NA 137 07/12/2012 1121   NA 141 01/06/2012 0804   K 3.7 11/12/2012 1448   K 3.9 07/12/2012 1121   K 4.6 01/06/2012 0804   CL 108* 11/12/2012 1448   CL 104 07/12/2012 1121   CL 98 01/06/2012 0804   CO2 26 11/12/2012 1448   CO2 24 07/12/2012 1121   CO2 28 01/06/2012 0804   BUN 11.0 11/12/2012 1448   BUN 8 07/12/2012 1121   BUN 10 01/06/2012 0804   CREATININE 0.6 11/12/2012 1448   CREATININE 0.45* 07/12/2012 1121   CREATININE 0.6 01/06/2012 0804      Component Value Date/Time   CALCIUM 8.3* 11/12/2012 1448   CALCIUM 8.6 07/12/2012 1121   CALCIUM 8.9 01/06/2012 0804   ALKPHOS 82 11/12/2012 1448   ALKPHOS 93 07/12/2012 1121   ALKPHOS 64 01/06/2012 0804   AST 38* 11/12/2012 1448   AST 44* 07/12/2012 1121   AST 27 01/06/2012 0804   ALT 28 11/12/2012 1448   ALT 49* 07/12/2012  1121   BILITOT 0.61 11/12/2012 1448   BILITOT 0.3 07/12/2012 1121   BILITOT 0.60 01/06/2012 0804       RADIOGRAPHIC STUDIES: Ct Chest W Contrast  10/12/2012  *RADIOLOGY REPORT*  Clinical Data:  Restaging metastatic lung cancer.  Undergoing oral chemotherapy.  CT CHEST, ABDOMEN AND PELVIS WITH CONTRAST  Technique:  Multidetector CT imaging of the chest, abdomen and pelvis was performed following the standard protocol during bolus administration of intravenous contrast.  Contrast: OMNIPAQUE IOHEXOL 300 MG/ML  SOLN  Comparison:  Chest CT 07/06/2012.  PET CT  01/16/2012 and 03/06/2009.   CT CHEST  Findings:  Left IJ Port-A-Cath is unchanged at the SVC right atrial junction.  There are no enlarged mediastinal, hilar or axillary lymph nodes.  A small loculated right pleural effusion is unchanged.  There is no left pleural effusion or pericardial effusion.  There are stable postsurgical changes status post right lower lobe resection.  Radiation changes in the medial right lung are stable. The irregular right upper lobe mass has slightly decreased in size, measuring 3.0 x 1.3 cm on image 11 (previously 3.4 x 1.7 cm).  This abuts the visceral pleural posterior medially.  There is no adjacent osseous destruction.  No other pulmonary nodules are identified.  The left lung is clear.  There is apparent old fracture of the left clavicle.  No aggressive osseous lesions are identified.  IMPRESSION:  1.  Slight improvement in the hypermetabolic mass in the medial right upper lobe. 2.  Stable loculated right pleural effusion and radiation changes medially in the right lung. 3.  No new findings identified.   CT ABDOMEN AND PELVIS  Findings:  The liver demonstrates heterogeneous hepatic steatosis which is similar to the prior study.  No focal lesions are identified.  The gallbladder, biliary system and pancreas appear normal.  The spleen appears normal.  There is no adrenal mass.  The right kidney appears normal.  There  is a small low-density lesion in the medial left kidney which is likely a cyst.  Once again demonstrated is massive enlargement of the uterus by a large heterogeneous mass on the right.  The overall dimensions of the uterus are approximately 19.9 x 12.0 cm transverse.  Focal mass measures up to 13.8 cm cephalocaudad.  The endometrium is displaced to the left.  Multiple dilated draining ovarian vein collaterals are again noted. This pelvic mass has been demonstrated on prior studies dating back to a PET CT performed in 2010. The bladder appears unremarkable. There is a small amount of pelvic ascites.  There are no suspicious osseous findings.  IMPRESSION:  1.  No evidence of abdominal pelvic metastatic disease. 2.  Large pelvic mass contiguous with the right aspect of the uterus is presumed to reflect an atypical fibroid based on stability from multiple prior studies. 3.  Heterogeneous hepatic steatosis.   Original Report Authenticated By: Carey Bullocks, M.D.     ASSESSMENT/PLAN: This is a very pleasant 51 years old Asian female with recurrent non-small cell lung cancer, adenocarcinoma. The patient is currently on treatment with Tarceva 150 mg by mouth daily for the last 3 months with no evidence for disease progression. Her previous rash that was suspicious for fungal in section has completely subsided. As stated above she was evaluated by the dermatologist and given a topical cream to apply. She continues to tolerate her Tarceva without significant difficulty. Patient was discussed with Dr. Arbutus Ped. She will continue on Tarceva at 150 mg by mouth daily. She'll return in one month with repeat CBC differential and C. met for another symptom management visit. She is given a prescription for Hycodan cough syrup to address her complaints of increased dry cough.  Laural Benes, Evelena Masci E, PA-C   All questions were answered. The patient knows to call the clinic with any problems, questions or concerns. We can certainly  see the patient much sooner if necessary.  I spent 20 minutes counseling the patient face to face. The total time spent in the appointment was 30 minutes.

## 2012-11-12 NOTE — Telephone Encounter (Signed)
appts made and printed for  Pt aom °

## 2012-12-10 ENCOUNTER — Telehealth: Payer: Self-pay | Admitting: Internal Medicine

## 2012-12-10 ENCOUNTER — Encounter: Payer: Self-pay | Admitting: Physician Assistant

## 2012-12-10 ENCOUNTER — Ambulatory Visit (HOSPITAL_BASED_OUTPATIENT_CLINIC_OR_DEPARTMENT_OTHER): Payer: 59 | Admitting: Physician Assistant

## 2012-12-10 ENCOUNTER — Other Ambulatory Visit (HOSPITAL_BASED_OUTPATIENT_CLINIC_OR_DEPARTMENT_OTHER): Payer: 59 | Admitting: Lab

## 2012-12-10 VITALS — BP 123/83 | HR 93 | Temp 97.1°F | Resp 20 | Ht 60.0 in | Wt 126.9 lb

## 2012-12-10 DIAGNOSIS — C3490 Malignant neoplasm of unspecified part of unspecified bronchus or lung: Secondary | ICD-10-CM

## 2012-12-10 DIAGNOSIS — C3430 Malignant neoplasm of lower lobe, unspecified bronchus or lung: Secondary | ICD-10-CM

## 2012-12-10 DIAGNOSIS — Z95828 Presence of other vascular implants and grafts: Secondary | ICD-10-CM

## 2012-12-10 LAB — COMPREHENSIVE METABOLIC PANEL (CC13)
ALT: 20 U/L (ref 0–55)
AST: 24 U/L (ref 5–34)
Albumin: 3.5 g/dL (ref 3.5–5.0)
Alkaline Phosphatase: 80 U/L (ref 40–150)
BUN: 8 mg/dL (ref 7.0–26.0)
CO2: 24 mEq/L (ref 22–29)
Calcium: 8.7 mg/dL (ref 8.4–10.4)
Chloride: 106 mEq/L (ref 98–107)
Creatinine: 0.6 mg/dL (ref 0.6–1.1)
Glucose: 127 mg/dl — ABNORMAL HIGH (ref 70–99)
Potassium: 3.9 mEq/L (ref 3.5–5.1)
Sodium: 135 mEq/L — ABNORMAL LOW (ref 136–145)
Total Bilirubin: 0.58 mg/dL (ref 0.20–1.20)
Total Protein: 7.3 g/dL (ref 6.4–8.3)

## 2012-12-10 LAB — CBC WITH DIFFERENTIAL/PLATELET
BASO%: 0.1 % (ref 0.0–2.0)
Basophils Absolute: 0 10*3/uL (ref 0.0–0.1)
EOS%: 0.4 % (ref 0.0–7.0)
Eosinophils Absolute: 0 10*3/uL (ref 0.0–0.5)
HCT: 37.7 % (ref 34.8–46.6)
HGB: 12.4 g/dL (ref 11.6–15.9)
LYMPH%: 15.4 % (ref 14.0–49.7)
MCH: 25.8 pg (ref 25.1–34.0)
MCHC: 32.9 g/dL (ref 31.5–36.0)
MCV: 78.5 fL — ABNORMAL LOW (ref 79.5–101.0)
MONO#: 0.6 10*3/uL (ref 0.1–0.9)
MONO%: 6.9 % (ref 0.0–14.0)
NEUT#: 6.5 10*3/uL (ref 1.5–6.5)
NEUT%: 77.2 % — ABNORMAL HIGH (ref 38.4–76.8)
Platelets: 266 10*3/uL (ref 145–400)
RBC: 4.8 10*6/uL (ref 3.70–5.45)
RDW: 15.8 % — ABNORMAL HIGH (ref 11.2–14.5)
WBC: 8.5 10*3/uL (ref 3.9–10.3)
lymph#: 1.3 10*3/uL (ref 0.9–3.3)

## 2012-12-10 MED ORDER — HEPARIN SOD (PORK) LOCK FLUSH 100 UNIT/ML IV SOLN
500.0000 [IU] | Freq: Once | INTRAVENOUS | Status: AC
  Administered 2012-12-10: 500 [IU] via INTRAVENOUS
  Filled 2012-12-10: qty 5

## 2012-12-10 MED ORDER — SODIUM CHLORIDE 0.9 % IJ SOLN
10.0000 mL | INTRAMUSCULAR | Status: DC | PRN
  Administered 2012-12-10: 10 mL via INTRAVENOUS
  Filled 2012-12-10: qty 10

## 2012-12-10 NOTE — Telephone Encounter (Signed)
appts made and printed for pt aom °

## 2012-12-10 NOTE — Patient Instructions (Addendum)
Continue taking Tarceva 150 mg by mouth dail Follow up in 1 month Your port a cath will be flushed today You may use an over the counter spray such as Chloraseptic Spray for symptom relief for your throat

## 2012-12-12 NOTE — Progress Notes (Signed)
[facility] Telephone:(336) [phone]   Fax:(336) [phone]  OFFICE PROGRESS NOTE  PRINCIPAL DIAGNOSIS: Local recurrence of non-small cell lung cancer, adenocarcinoma, initially diagnosed as stage IB (T2a N0 M0) in March of 2010.   PRIOR THERAPY:  1. Status post right lower lobectomy under the care of Dr. Laneta Simmers on 04/08/2009. 2. Status post palliative radiotherapy to the right lower lobe recurrent lung mass under the care of Dr. Mitzi Hansen, completed on June 08, 2011. 3. Systemic chemotherapy with carboplatin for AUC of 5 and Alimta 500 mg/M2 every 3 weeks. She is status post 3 cycles.  CURRENT THERAPY: Tarceva 150 mg by mouth daily, therapy beginning 07/24/2012. Status post approximately 5 months of therapy.  INTERVAL HISTORY: [name] 52 y.o. female returns to the clinic today for followup visit accompanied by her son. She complains of a mild sore throat, not associated with a cough , fever or chills.She continues to have occasional episodes of diarrhea. She is tolerating her treatment was Tarceva fairly well with no significant adverse effects except as mentioned above. She denied having any significant weight loss or night sweats. She denied having any chest pain, shortness breath, cough or hemoptysis. She is due to have her port-a-cath flushed today.  MEDICAL HISTORY: Past Medical History  Diagnosis Date  . Lung cancer     right lower lobe adenocarcinoma  . History of radiation therapy 05/02/11 to 06/08/11    right lung    ALLERGIES:  is allergic to codeine.  MEDICATIONS:  Current Outpatient Prescriptions  Medication Sig Dispense Refill  . acetaminophen (TYLENOL) 325 MG tablet Take 650 mg by mouth every 6 (six) hours as needed. For pain      . benzonatate (TESSALON) 100 MG capsule 1 or 2 by mouth every eight hours as needed for cough  30 capsule  2  . chlorpheniramine-HYDROcodone (TUSSIONEX) 10-8 MG/5ML LQCR Take 5 mLs by mouth every 12 (twelve) hours as needed. For  cough      . dexamethasone (DECADRON) 4 MG tablet Take 4 mg by mouth 2 (two) times daily. 1 tab PO twice daily day before, day of, and day after chemotherapy      . erlotinib (TARCEVA) 150 MG tablet Take 1 tablet (150 mg total) by mouth daily.  90 tablet  0  . fluticasone (CUTIVATE) 0.05 % cream       . folic acid (FOLVITE) 1 MG tablet Take 1 tablet (1 mg total) by mouth daily.  30 tablet  3  . HYDROcodone-acetaminophen (VICODIN) 5-500 MG per tablet       . HYDROcodone-homatropine (HYCODAN) 5-1.5 MG/5ML syrup Take 5 mLs by mouth every 6 (six) hours as needed for cough.  240 mL  0  . ibuprofen (ADVIL,MOTRIN) 100 MG tablet Take 200 mg by mouth every 6 (six) hours as needed. For pain      . ipratropium (ATROVENT) 0.03 % nasal spray Place 1 spray into the nose 2 (two) times daily.       Marland Kitchen lidocaine-prilocaine (EMLA) cream Apply to Port-A-Cath prior to tx PRN  5 g  prn 1 year  . methylPREDNIsolone (MEDROL DOSPACK) 4 MG tablet       . Multiple Vitamin (MULTIVITAMIN) tablet Take 1 tablet by mouth daily.        . prochlorperazine (COMPAZINE) 10 MG tablet Take 10 mg by mouth every 6 (six) hours as needed. For nausea       Current Facility-Administered Medications  Medication Dose Route  Frequency Provider Last Rate Last Dose  . sodium chloride 0.9 % injection 10 mL  10 mL Intravenous PRN Si Gaul, MD   10 mL at 12/10/12 1611    SURGICAL HISTORY:  Past Surgical History  Procedure Date  . Lung lobectomy 04/18/2009    RLL  . Portacath placement 02/29/2012    Procedure: INSERTION PORT-A-CATH;  Surgeon: Ines Bloomer, MD;  Location: [facility] OR;  Service: Thoracic;  Laterality: Left;  PowerPort 8 F attachable in left internal jugular.    REVIEW OF SYSTEMS:  Pertinent items are noted in HPI.   PHYSICAL EXAMINATION: General appearance: alert, cooperative and no distress Head: Normocephalic, without obvious abnormality, atraumatic Neck: no adenopathy Resp: clear to auscultation bilaterally Cardio:  regular rate and rhythm, S1, S2 normal, no murmur, click, rub or gallop GI: soft, non-tender; bowel sounds normal; no masses,  no organomegaly Extremities: extremities normal, atraumatic, no cyanosis or edema Neurologic: Alert and oriented X 3, normal strength and tone. Normal symmetric reflexes. Normal coordination and gait   ECOG PERFORMANCE STATUS: 1 - Symptomatic but completely ambulatory  Blood pressure 123/83, pulse 93, temperature 97.1 F (36.2 C), temperature source Oral, resp. rate 20, height 5' (1.524 m), weight 126 lb 14.4 oz (57.561 kg).  LABORATORY DATA: Lab Results  Component Value Date   WBC 8.5 12/10/2012   HGB 12.4 12/10/2012   HCT 37.7 12/10/2012   MCV 78.5* 12/10/2012   PLT 266 12/10/2012      Chemistry      Component Value Date/Time   NA 135* 12/10/2012 1451   NA 137 07/12/2012 1121   NA 141 01/06/2012 0804   K 3.9 12/10/2012 1451   K 3.9 07/12/2012 1121   K 4.6 01/06/2012 0804   CL 106 12/10/2012 1451   CL 104 07/12/2012 1121   CL 98 01/06/2012 0804   CO2 24 12/10/2012 1451   CO2 24 07/12/2012 1121   CO2 28 01/06/2012 0804   BUN 8.0 12/10/2012 1451   BUN 8 07/12/2012 1121   BUN 10 01/06/2012 0804   CREATININE 0.6 12/10/2012 1451   CREATININE 0.45* 07/12/2012 1121   CREATININE 0.6 01/06/2012 0804      Component Value Date/Time   CALCIUM 8.7 12/10/2012 1451   CALCIUM 8.6 07/12/2012 1121   CALCIUM 8.9 01/06/2012 0804   ALKPHOS 80 12/10/2012 1451   ALKPHOS 93 07/12/2012 1121   ALKPHOS 64 01/06/2012 0804   AST 24 12/10/2012 1451   AST 44* 07/12/2012 1121   AST 27 01/06/2012 0804   ALT 20 12/10/2012 1451   ALT 49* 07/12/2012 1121   BILITOT 0.58 12/10/2012 1451   BILITOT 0.3 07/12/2012 1121   BILITOT 0.60 01/06/2012 0804       RADIOGRAPHIC STUDIES: Ct Chest W Contrast  10/12/2012  *RADIOLOGY REPORT*  Clinical Data:  Restaging metastatic lung cancer.  Undergoing oral chemotherapy.  CT CHEST, ABDOMEN AND PELVIS WITH CONTRAST  Technique:  Multidetector CT imaging of the chest, abdomen  and pelvis was performed following the standard protocol during bolus administration of intravenous contrast.  Contrast: OMNIPAQUE IOHEXOL 300 MG/ML  SOLN  Comparison:  Chest CT 07/06/2012.  PET CT 01/16/2012 and 03/06/2009.   CT CHEST  Findings:  Left IJ Port-A-Cath is unchanged at the SVC right atrial junction.  There are no enlarged mediastinal, hilar or axillary lymph nodes.  A small loculated right pleural effusion is unchanged.  There is no left pleural effusion or pericardial effusion.  There are stable postsurgical changes  status post right lower lobe resection.  Radiation changes in the medial right lung are stable. The irregular right upper lobe mass has slightly decreased in size, measuring 3.0 x 1.3 cm on image 11 (previously 3.4 x 1.7 cm).  This abuts the visceral pleural posterior medially.  There is no adjacent osseous destruction.  No other pulmonary nodules are identified.  The left lung is clear.  There is apparent old fracture of the left clavicle.  No aggressive osseous lesions are identified.  IMPRESSION:  1.  Slight improvement in the hypermetabolic mass in the medial right upper lobe. 2.  Stable loculated right pleural effusion and radiation changes medially in the right lung. 3.  No new findings identified.   CT ABDOMEN AND PELVIS  Findings:  The liver demonstrates heterogeneous hepatic steatosis which is similar to the prior study.  No focal lesions are identified.  The gallbladder, biliary system and pancreas appear normal.  The spleen appears normal.  There is no adrenal mass.  The right kidney appears normal.  There is a small low-density lesion in the medial left kidney which is likely a cyst.  Once again demonstrated is massive enlargement of the uterus by a large heterogeneous mass on the right.  The overall dimensions of the uterus are approximately 19.9 x 12.0 cm transverse.  Focal mass measures up to 13.8 cm cephalocaudad.  The endometrium is displaced to the left.  Multiple  dilated draining ovarian vein collaterals are again noted. This pelvic mass has been demonstrated on prior studies dating back to a PET CT performed in 2010. The bladder appears unremarkable. There is a small amount of pelvic ascites.  There are no suspicious osseous findings.  IMPRESSION:  1.  No evidence of abdominal pelvic metastatic disease. 2.  Large pelvic mass contiguous with the right aspect of the uterus is presumed to reflect an atypical fibroid based on stability from multiple prior studies. 3.  Heterogeneous hepatic steatosis.   Original Report Authenticated By: Carey Bullocks, M.D.     ASSESSMENT/PLAN: This is a very pleasant 52 years old Asian female with recurrent non-small cell lung cancer, adenocarcinoma. The patient is currently on treatment with Tarceva 150 mg by mouth daily for the last 5 months with no evidence for disease progression. She continues to tolerate her Tarceva without significant difficulty. Patient was discussed with Dr. Arbutus Ped. She will continue on Tarceva at 150 mg by mouth daily. She'll return in one month with repeat CBC differential and C. met for another symptom management visit. She will try an over the counter spray such as chloraceptic spray for her throat symptoms. Her port-a-cath will be flushed today and will be due for another flush in 8 weeks.  [name], Kathryn Rynders E, PA-C   All questions were answered. The patient knows to call the clinic with any problems, questions or concerns. We can certainly see the patient much sooner if necessary.  I spent 20 minutes counseling the patient face to face. The total time spent in the appointment was 30 minutes.

## 2012-12-21 ENCOUNTER — Other Ambulatory Visit: Payer: Self-pay | Admitting: Medical Oncology

## 2012-12-21 NOTE — Telephone Encounter (Signed)
Son said [name] just got her Tarceva now and  she normally takes it at 0600 everyday . I told him to start it tomorrow at her usual time.

## 2012-12-28 ENCOUNTER — Ambulatory Visit (INDEPENDENT_AMBULATORY_CARE_PROVIDER_SITE_OTHER): Payer: 59 | Admitting: Internal Medicine

## 2012-12-28 VITALS — BP 112/77 | HR 107 | Temp 98.2°F | Resp 16 | Ht 60.0 in | Wt 122.6 lb

## 2012-12-28 DIAGNOSIS — J988 Other specified respiratory disorders: Secondary | ICD-10-CM

## 2012-12-28 DIAGNOSIS — J45909 Unspecified asthma, uncomplicated: Secondary | ICD-10-CM

## 2012-12-28 DIAGNOSIS — C3490 Malignant neoplasm of unspecified part of unspecified bronchus or lung: Secondary | ICD-10-CM

## 2012-12-28 DIAGNOSIS — J22 Unspecified acute lower respiratory infection: Secondary | ICD-10-CM

## 2012-12-28 MED ORDER — HYDROCODONE-HOMATROPINE 5-1.5 MG/5ML PO SYRP: 5.0000 mL | ORAL_SOLUTION | Freq: Four times a day (QID) | ORAL | Status: AC | PRN

## 2012-12-28 MED ORDER — IPRATROPIUM BROMIDE 0.03 % NA SOLN: 1.0000 | Freq: Two times a day (BID) | NASAL | Status: DC

## 2012-12-28 MED ORDER — AZITHROMYCIN 500 MG PO TABS: 500.0000 mg | ORAL_TABLET | Freq: Every day | ORAL | Status: DC

## 2012-12-28 MED ORDER — PREDNISONE 20 MG PO TABS: ORAL_TABLET | ORAL | Status: DC

## 2012-12-29 NOTE — Progress Notes (Signed)
  Subjective:    Patient ID: [name], female    DOB: [birth year], 52 y.o.   MRN: [id number]  HPI five-day history of progressively worsening cough with fever and inability to sleep at night/cough occasionally productive/cough hurts in the anterior chest/chest feels tight with some degree of shortness of breath when active\\  Past medical History-see current treatment with Tarceva for lung cancer  OV 12/10/12 PRINCIPAL DIAGNOSIS: Local recurrence of non-small cell  , adenocarcinoma, initially diagnosed as stage IB (T2a N0 M0) in March of 2010.  PRIOR THERAPY:  1. Status post right lower lobectomy under the care of Dr. Laneta Simmers on 04/08/2009. 2. Status post palliative radiotherapy to the right lower lobe recurrent lung mass under the care of Dr. Mitzi Hansen, completed on June 08, 2011. 3. Systemic chemotherapy with carboplatin for AUC of 5 and Alimta 500 mg/M2 every 3 weeks. She is status post 3 cycles. CURRENT THERAPY: Tarceva 150 mg by mouth daily, therapy beginning 07/24/2012. Status post approximately 5 months of therapy.  She had the flu vaccine/she thinks she had Pneumovax At last chest x-ray she was known to have scarring in the right lung  post treatment and  a small right pleural effusion  Review of Systems No weight loss No change in appetite Mild sore throat No nausea or vomiting    Objective:   Physical Exam BP 112/77  Pulse 107  Temp 98.2 F (36.8 C) (Oral)  Resp 16  Ht 5' (1.524 m)  Wt 122 lb 9.6 oz (55.611 kg)  BMI 23.94 kg/m2  SpO2 98% No acute distress TMs clear Conjunctiva clear Nares boggy but without purulent mucus Throat slightly injected No cervical nodes Lungs with loud adventitial sounds on inspiration right anterior that clear with deep breathing Diminished breath sounds at the bases, especially on the right Mild wheezing with forced expiration bilaterally       Assessment & Plan:  Problem #1 lower respiratory infection with reactive airway disease Problem  #2 lung cancer  Meds ordered this encounter  Medications  . azithromycin (ZITHROMAX) 500 MG tablet    Sig: Take 1 tablet (500 mg total) by mouth daily.    Dispense:  5 tablet    Refill:  0  . HYDROcodone-homatropine (HYCODAN) 5-1.5 MG/5ML syrup    Sig: Take 5 mLs by mouth every 6 (six) hours as needed for cough.    Dispense:  120 mL    Refill:  0  . predniSONE (DELTASONE) 20 MG tablet    Sig: 3/2/1 single daily dose for 3 days    Dispense:  6 tablet    Refill:  0  . ipratropium (ATROVENT) 0.03 % nasal spray     she requests a refill     Sig: Place 1 spray into the nose 2 (two) times daily.    Dispense:  30 mL    Refill:  2

## 2013-01-07 ENCOUNTER — Ambulatory Visit (HOSPITAL_BASED_OUTPATIENT_CLINIC_OR_DEPARTMENT_OTHER): Payer: 59 | Admitting: Physician Assistant

## 2013-01-07 ENCOUNTER — Other Ambulatory Visit (HOSPITAL_BASED_OUTPATIENT_CLINIC_OR_DEPARTMENT_OTHER): Payer: 59 | Admitting: Lab

## 2013-01-07 ENCOUNTER — Telehealth: Payer: Self-pay | Admitting: Internal Medicine

## 2013-01-07 ENCOUNTER — Encounter: Payer: Self-pay | Admitting: Physician Assistant

## 2013-01-07 VITALS — BP 104/71 | HR 103 | Temp 98.0°F | Resp 18 | Ht 60.0 in | Wt 122.3 lb

## 2013-01-07 DIAGNOSIS — R05 Cough: Secondary | ICD-10-CM

## 2013-01-07 DIAGNOSIS — R059 Cough, unspecified: Secondary | ICD-10-CM

## 2013-01-07 DIAGNOSIS — C3410 Malignant neoplasm of upper lobe, unspecified bronchus or lung: Secondary | ICD-10-CM

## 2013-01-07 DIAGNOSIS — C3490 Malignant neoplasm of unspecified part of unspecified bronchus or lung: Secondary | ICD-10-CM

## 2013-01-07 LAB — CBC WITH DIFFERENTIAL/PLATELET
BASO%: 0.4 % (ref 0.0–2.0)
Basophils Absolute: 0 10*3/uL (ref 0.0–0.1)
EOS%: 0.9 % (ref 0.0–7.0)
Eosinophils Absolute: 0.1 10*3/uL (ref 0.0–0.5)
HCT: 37.8 % (ref 34.8–46.6)
HGB: 12.1 g/dL (ref 11.6–15.9)
LYMPH%: 19.6 % (ref 14.0–49.7)
MCH: 25.1 pg (ref 25.1–34.0)
MCHC: 32.1 g/dL (ref 31.5–36.0)
MCV: 78.2 fL — ABNORMAL LOW (ref 79.5–101.0)
MONO#: 0.6 10*3/uL (ref 0.1–0.9)
MONO%: 8 % (ref 0.0–14.0)
NEUT#: 5.3 10*3/uL (ref 1.5–6.5)
NEUT%: 71.1 % (ref 38.4–76.8)
Platelets: 427 10*3/uL — ABNORMAL HIGH (ref 145–400)
RBC: 4.83 10*6/uL (ref 3.70–5.45)
RDW: 16.4 % — ABNORMAL HIGH (ref 11.2–14.5)
WBC: 7.5 10*3/uL (ref 3.9–10.3)
lymph#: 1.5 10*3/uL (ref 0.9–3.3)

## 2013-01-07 LAB — COMPREHENSIVE METABOLIC PANEL (CC13)
ALT: 32 U/L (ref 0–55)
AST: 32 U/L (ref 5–34)
Albumin: 3.3 g/dL — ABNORMAL LOW (ref 3.5–5.0)
Alkaline Phosphatase: 98 U/L (ref 40–150)
BUN: 9.9 mg/dL (ref 7.0–26.0)
CO2: 26 mEq/L (ref 22–29)
Calcium: 8.6 mg/dL (ref 8.4–10.4)
Chloride: 108 mEq/L — ABNORMAL HIGH (ref 98–107)
Creatinine: 0.7 mg/dL (ref 0.6–1.1)
Glucose: 109 mg/dl — ABNORMAL HIGH (ref 70–99)
Potassium: 3.7 mEq/L (ref 3.5–5.1)
Sodium: 142 mEq/L (ref 136–145)
Total Bilirubin: 0.31 mg/dL (ref 0.20–1.20)
Total Protein: 7.1 g/dL (ref 6.4–8.3)

## 2013-01-07 NOTE — Telephone Encounter (Signed)
gv and printed appt schedule for pt for March...gv pt Barium...pt aware

## 2013-01-07 NOTE — Patient Instructions (Addendum)
Continue on Tarceva 150 mg by mouth daily Follow up with Dr. Arbutus Ped in 1 month with a restaging CT scan of your chest, abdomen and pelvis to re-evaluate your disease

## 2013-01-08 NOTE — Progress Notes (Signed)
[facility] Telephone:(336) [phone]   Fax:(336) [phone]  OFFICE PROGRESS NOTE  PRINCIPAL DIAGNOSIS: Local recurrence of non-small cell lung cancer, adenocarcinoma, initially diagnosed as stage IB (T2a N0 M0) in March of 2010.   PRIOR THERAPY:  1. Status post right lower lobectomy under the care of Dr. Laneta Simmers on 04/08/2009. 2. Status post palliative radiotherapy to the right lower lobe recurrent lung mass under the care of Dr. Mitzi Hansen, completed on June 08, 2011. 3. Systemic chemotherapy with carboplatin for AUC of 5 and Alimta 500 mg/M2 every 3 weeks. She is status post 3 cycles.  CURRENT THERAPY: Tarceva 150 mg by mouth daily, therapy beginning 07/24/2012. Status post approximately 6 months of therapy.  INTERVAL HISTORY: [name] 52 y.o. female returns to the clinic today for followup visit accompanied by her son. She reports 3 days of diarrhea but feels was related to a virus. She denied use any Imodium. She also reports that she had bronchitis about 2 weeks ago. She complains of intermittent cough but states that the prescription cough syrup makes her drowsy. She currently has no skin rash associated with the Tarceva. She is tolerating her treatment was Tarceva fairly well with no significant adverse effects except as mentioned above. She denied having any significant weight loss or night sweats. She denied having any chest pain, shortness breath, cough or hemoptysis.   MEDICAL HISTORY: Past Medical History  Diagnosis Date  . Lung cancer     right lower lobe adenocarcinoma  . History of radiation therapy 05/02/11 to 06/08/11    right lung    ALLERGIES:  is allergic to codeine.  MEDICATIONS:  Current Outpatient Prescriptions  Medication Sig Dispense Refill  . acetaminophen (TYLENOL) 325 MG tablet Take 650 mg by mouth every 6 (six) hours as needed. For pain      . azithromycin (ZITHROMAX) 500 MG tablet Take 1 tablet (500 mg total) by mouth daily.  5 tablet  0  .  benzonatate (TESSALON) 100 MG capsule 1 or 2 by mouth every eight hours as needed for cough  30 capsule  2  . chlorpheniramine-HYDROcodone (TUSSIONEX) 10-8 MG/5ML LQCR Take 5 mLs by mouth every 12 (twelve) hours as needed. For cough      . dexamethasone (DECADRON) 4 MG tablet Take 4 mg by mouth 2 (two) times daily. 1 tab PO twice daily day before, day of, and day after chemotherapy      . erlotinib (TARCEVA) 150 MG tablet Take 1 tablet (150 mg total) by mouth daily.  90 tablet  0  . fluticasone (CUTIVATE) 0.05 % cream       . folic acid (FOLVITE) 1 MG tablet Take 1 tablet (1 mg total) by mouth daily.  30 tablet  3  . HYDROcodone-acetaminophen (VICODIN) 5-500 MG per tablet       . HYDROcodone-homatropine (HYCODAN) 5-1.5 MG/5ML syrup Take 5 mLs by mouth every 6 (six) hours as needed for cough.  240 mL  0  . ibuprofen (ADVIL,MOTRIN) 100 MG tablet Take 200 mg by mouth every 6 (six) hours as needed. For pain      . ipratropium (ATROVENT) 0.03 % nasal spray Place 1 spray into the nose 2 (two) times daily.  30 mL  2  . lidocaine-prilocaine (EMLA) cream Apply to Port-A-Cath prior to tx PRN  5 g  prn 1 year  . methylPREDNIsolone (MEDROL DOSPACK) 4 MG tablet       . Multiple Vitamin (MULTIVITAMIN) tablet Take  1 tablet by mouth daily.        . predniSONE (DELTASONE) 20 MG tablet 3/2/1 single daily dose for 3 days  6 tablet  0  . prochlorperazine (COMPAZINE) 10 MG tablet Take 10 mg by mouth every 6 (six) hours as needed. For nausea       No current facility-administered medications for this visit.    SURGICAL HISTORY:  Past Surgical History  Procedure Laterality Date  . Lung lobectomy  04/18/2009    RLL  . Portacath placement  02/29/2012    Procedure: INSERTION PORT-A-CATH;  Surgeon: Ines Bloomer, MD;  Location: [facility] OR;  Service: Thoracic;  Laterality: Left;  PowerPort 8 F attachable in left internal jugular.    REVIEW OF SYSTEMS:  Pertinent items are noted in HPI.   PHYSICAL EXAMINATION: General  appearance: alert, cooperative and no distress Head: Normocephalic, without obvious abnormality, atraumatic Neck: no adenopathy Resp: clear to auscultation bilaterally Cardio: regular rate and rhythm, S1, S2 normal, no murmur, click, rub or gallop GI: soft, non-tender; bowel sounds normal; no masses,  no organomegaly Extremities: extremities normal, atraumatic, no cyanosis or edema Neurologic: Alert and oriented X 3, normal strength and tone. Normal symmetric reflexes. Normal coordination and gait   ECOG PERFORMANCE STATUS: 1 - Symptomatic but completely ambulatory  Blood pressure 104/71, pulse 103, temperature 98 F (36.7 C), temperature source Oral, resp. rate 18, height 5' (1.524 m), weight 122 lb 4.8 oz (55.475 kg).  LABORATORY DATA: Lab Results  Component Value Date   WBC 7.5 01/07/2013   HGB 12.1 01/07/2013   HCT 37.8 01/07/2013   MCV 78.2* 01/07/2013   PLT 427* 01/07/2013      Chemistry      Component Value Date/Time   NA 142 01/07/2013 1453   NA 137 07/12/2012 1121   NA 141 01/06/2012 0804   K 3.7 01/07/2013 1453   K 3.9 07/12/2012 1121   K 4.6 01/06/2012 0804   CL 108* 01/07/2013 1453   CL 104 07/12/2012 1121   CL 98 01/06/2012 0804   CO2 26 01/07/2013 1453   CO2 24 07/12/2012 1121   CO2 28 01/06/2012 0804   BUN 9.9 01/07/2013 1453   BUN 8 07/12/2012 1121   BUN 10 01/06/2012 0804   CREATININE 0.7 01/07/2013 1453   CREATININE 0.45* 07/12/2012 1121   CREATININE 0.6 01/06/2012 0804      Component Value Date/Time   CALCIUM 8.6 01/07/2013 1453   CALCIUM 8.6 07/12/2012 1121   CALCIUM 8.9 01/06/2012 0804   ALKPHOS 98 01/07/2013 1453   ALKPHOS 93 07/12/2012 1121   ALKPHOS 64 01/06/2012 0804   AST 32 01/07/2013 1453   AST 44* 07/12/2012 1121   AST 27 01/06/2012 0804   ALT 32 01/07/2013 1453   ALT 49* 07/12/2012 1121   BILITOT 0.31 01/07/2013 1453   BILITOT 0.3 07/12/2012 1121   BILITOT 0.60 01/06/2012 0804       RADIOGRAPHIC STUDIES: Ct Chest W Contrast  10/12/2012  *RADIOLOGY REPORT*  Clinical  Data:  Restaging metastatic lung cancer.  Undergoing oral chemotherapy.  CT CHEST, ABDOMEN AND PELVIS WITH CONTRAST  Technique:  Multidetector CT imaging of the chest, abdomen and pelvis was performed following the standard protocol during bolus administration of intravenous contrast.  Contrast: OMNIPAQUE IOHEXOL 300 MG/ML  SOLN  Comparison:  Chest CT 07/06/2012.  PET CT 01/16/2012 and 03/06/2009.   CT CHEST  Findings:  Left IJ Port-A-Cath is unchanged at the SVC right atrial junction.  There are no enlarged mediastinal, hilar or axillary lymph nodes.  A small loculated right pleural effusion is unchanged.  There is no left pleural effusion or pericardial effusion.  There are stable postsurgical changes status post right lower lobe resection.  Radiation changes in the medial right lung are stable. The irregular right upper lobe mass has slightly decreased in size, measuring 3.0 x 1.3 cm on image 11 (previously 3.4 x 1.7 cm).  This abuts the visceral pleural posterior medially.  There is no adjacent osseous destruction.  No other pulmonary nodules are identified.  The left lung is clear.  There is apparent old fracture of the left clavicle.  No aggressive osseous lesions are identified.  IMPRESSION:  1.  Slight improvement in the hypermetabolic mass in the medial right upper lobe. 2.  Stable loculated right pleural effusion and radiation changes medially in the right lung. 3.  No new findings identified.   CT ABDOMEN AND PELVIS  Findings:  The liver demonstrates heterogeneous hepatic steatosis which is similar to the prior study.  No focal lesions are identified.  The gallbladder, biliary system and pancreas appear normal.  The spleen appears normal.  There is no adrenal mass.  The right kidney appears normal.  There is a small low-density lesion in the medial left kidney which is likely a cyst.  Once again demonstrated is massive enlargement of the uterus by a large heterogeneous mass on the right.  The  overall dimensions of the uterus are approximately 19.9 x 12.0 cm transverse.  Focal mass measures up to 13.8 cm cephalocaudad.  The endometrium is displaced to the left.  Multiple dilated draining ovarian vein collaterals are again noted. This pelvic mass has been demonstrated on prior studies dating back to a PET CT performed in 2010. The bladder appears unremarkable. There is a small amount of pelvic ascites.  There are no suspicious osseous findings.  IMPRESSION:  1.  No evidence of abdominal pelvic metastatic disease. 2.  Large pelvic mass contiguous with the right aspect of the uterus is presumed to reflect an atypical fibroid based on stability from multiple prior studies. 3.  Heterogeneous hepatic steatosis.   Original Report Authenticated By: Carey Bullocks, M.D.     ASSESSMENT/PLAN: This is a very pleasant 52 years old Asian female with recurrent non-small cell lung cancer, adenocarcinoma. The patient is currently on treatment with Tarceva 150 mg by mouth daily for the last 6 months with no evidence for disease progression on her last restaging CT scan. She continues to tolerate her Tarceva without significant difficulty. Patient was discussed with Dr. Arbutus Ped. She will continue on Tarceva at 150 mg by mouth daily. She'll return in one month with repeat CBC differential and C. met as well as a CT of the chest, abdomen and pelvis with contrast to reevaluate her disease. She was advised to try an over-the-counter cough syrup such as Delsym or Robitussin-DM for her cough is these are less likely to make her drowsy. She'll be due to have her Port-A-Cath flushed next month unless it is used during her restaging CT scan.   Laural Benes, Mahathi Pokorney E, PA-C   All questions were answered. The patient knows to call the clinic with any problems, questions or concerns. We can certainly see the patient much sooner if necessary.  I spent 20 minutes counseling the patient face to face. The total time spent in the  appointment was 30 minutes.

## 2013-01-21 ENCOUNTER — Other Ambulatory Visit: Payer: Self-pay | Admitting: *Deleted

## 2013-01-21 MED ORDER — ERLOTINIB HCL 150 MG PO TABS: 150.0000 mg | ORAL_TABLET | Freq: Every day | ORAL | Status: DC

## 2013-01-21 NOTE — Telephone Encounter (Signed)
THIS REFILL REQUEST FOR TARCEVA WAS GIVEN TO DR.[name]'S NURSE, STEPHANIE JOHNSON,RN. 

## 2013-01-21 NOTE — Addendum Note (Signed)
Addended by: Arvilla Meres on: 01/21/2013 12:03 PM   Modules accepted: Orders

## 2013-02-01 ENCOUNTER — Ambulatory Visit (HOSPITAL_COMMUNITY): Payer: 59

## 2013-02-01 ENCOUNTER — Ambulatory Visit (HOSPITAL_COMMUNITY)
Admission: RE | Admit: 2013-02-01 | Discharge: 2013-02-01 | Disposition: A | Discharge status: Home / Self Care | Payer: 59 | Source: Ambulatory Visit | Attending: Physician Assistant | Admitting: Physician Assistant

## 2013-02-01 DIAGNOSIS — Z902 Acquired absence of lung [part of]: Secondary | ICD-10-CM | POA: Insufficient documentation

## 2013-02-01 DIAGNOSIS — J90 Pleural effusion, not elsewhere classified: Secondary | ICD-10-CM | POA: Insufficient documentation

## 2013-02-01 DIAGNOSIS — R911 Solitary pulmonary nodule: Secondary | ICD-10-CM | POA: Insufficient documentation

## 2013-02-01 DIAGNOSIS — C3490 Malignant neoplasm of unspecified part of unspecified bronchus or lung: Secondary | ICD-10-CM

## 2013-02-01 DIAGNOSIS — D259 Leiomyoma of uterus, unspecified: Secondary | ICD-10-CM | POA: Insufficient documentation

## 2013-02-01 MED ORDER — IOHEXOL 300 MG/ML  SOLN
100.0000 mL | Freq: Once | INTRAMUSCULAR | Status: AC | PRN
  Administered 2013-02-01: 100 mL via INTRAVENOUS

## 2013-02-04 ENCOUNTER — Ambulatory Visit (HOSPITAL_BASED_OUTPATIENT_CLINIC_OR_DEPARTMENT_OTHER): Payer: 59 | Admitting: Internal Medicine

## 2013-02-04 ENCOUNTER — Other Ambulatory Visit (HOSPITAL_BASED_OUTPATIENT_CLINIC_OR_DEPARTMENT_OTHER): Payer: 59 | Admitting: Lab

## 2013-02-04 ENCOUNTER — Encounter: Payer: Self-pay | Admitting: Internal Medicine

## 2013-02-04 ENCOUNTER — Telehealth: Payer: Self-pay | Admitting: Internal Medicine

## 2013-02-04 VITALS — BP 115/75 | HR 93 | Temp 98.7°F | Resp 20 | Ht 60.0 in | Wt 126.4 lb

## 2013-02-04 DIAGNOSIS — C3410 Malignant neoplasm of upper lobe, unspecified bronchus or lung: Secondary | ICD-10-CM

## 2013-02-04 DIAGNOSIS — C3490 Malignant neoplasm of unspecified part of unspecified bronchus or lung: Secondary | ICD-10-CM

## 2013-02-04 DIAGNOSIS — R21 Rash and other nonspecific skin eruption: Secondary | ICD-10-CM

## 2013-02-04 DIAGNOSIS — R197 Diarrhea, unspecified: Secondary | ICD-10-CM

## 2013-02-04 LAB — COMPREHENSIVE METABOLIC PANEL (CC13)
ALT: 24 U/L (ref 0–55)
AST: 26 U/L (ref 5–34)
Albumin: 3.4 g/dL — ABNORMAL LOW (ref 3.5–5.0)
Alkaline Phosphatase: 83 U/L (ref 40–150)
BUN: 11.5 mg/dL (ref 7.0–26.0)
CO2: 27 mEq/L (ref 22–29)
Calcium: 8.9 mg/dL (ref 8.4–10.4)
Chloride: 105 mEq/L (ref 98–107)
Creatinine: 0.6 mg/dL (ref 0.6–1.1)
Glucose: 89 mg/dl (ref 70–99)
Potassium: 3.9 mEq/L (ref 3.5–5.1)
Sodium: 140 mEq/L (ref 136–145)
Total Bilirubin: 0.72 mg/dL (ref 0.20–1.20)
Total Protein: 7 g/dL (ref 6.4–8.3)

## 2013-02-04 LAB — CBC WITH DIFFERENTIAL/PLATELET
BASO%: 0.3 % (ref 0.0–2.0)
Basophils Absolute: 0 10*3/uL (ref 0.0–0.1)
EOS%: 0.5 % (ref 0.0–7.0)
Eosinophils Absolute: 0 10*3/uL (ref 0.0–0.5)
HCT: 34 % — ABNORMAL LOW (ref 34.8–46.6)
HGB: 10.8 g/dL — ABNORMAL LOW (ref 11.6–15.9)
LYMPH%: 19.2 % (ref 14.0–49.7)
MCH: 24.1 pg — ABNORMAL LOW (ref 25.1–34.0)
MCHC: 31.7 g/dL (ref 31.5–36.0)
MCV: 76 fL — ABNORMAL LOW (ref 79.5–101.0)
MONO#: 0.5 10*3/uL (ref 0.1–0.9)
MONO%: 7.4 % (ref 0.0–14.0)
NEUT#: 5.2 10*3/uL (ref 1.5–6.5)
NEUT%: 72.6 % (ref 38.4–76.8)
Platelets: 337 10*3/uL (ref 145–400)
RBC: 4.48 10*6/uL (ref 3.70–5.45)
RDW: 16.5 % — ABNORMAL HIGH (ref 11.2–14.5)
WBC: 7.1 10*3/uL (ref 3.9–10.3)
lymph#: 1.4 10*3/uL (ref 0.9–3.3)

## 2013-02-04 MED ORDER — DOXYCYCLINE HYCLATE 100 MG PO TABS: 100.0000 mg | ORAL_TABLET | Freq: Two times a day (BID) | ORAL | Status: DC

## 2013-02-04 NOTE — Patient Instructions (Signed)
Your scan showed further improvement in the right lung mass. Continue treatment with Tarceva Followup in one month.

## 2013-02-04 NOTE — Progress Notes (Signed)
[facility] Telephone:(336) [phone]   [phone]  OFFICE PROGRESS NOTE  Lajuana Matte., MD [address] [place] [address]  PRINCIPAL DIAGNOSIS: Local recurrence of non-small cell lung cancer, adenocarcinoma, initially diagnosed as stage IB (T2a N0 M0) in March of 2010.   PRIOR THERAPY:  1. Status post right lower lobectomy under the care of Dr. Laneta Simmers on 04/08/2009. 2. Status post palliative radiotherapy to the right lower lobe recurrent lung mass under the care of Dr. Mitzi Hansen, completed on June 08, 2011. 3. Systemic chemotherapy with carboplatin for AUC of 5 and Alimta 500 mg/M2 every 3 weeks. She is status post 3 cycles.  CURRENT THERAPY: Tarceva 150 mg by mouth daily, therapy beginning 07/24/2012. Status post approximately 6 months of therapy.  INTERVAL HISTORY: [name] 52 y.o. female returns to the clinic today for followup visit accompanied her daughter. The patient is tolerating her treatment with Tarceva fairly well with no significant adverse effects except for mild skin rash on the face as well as few episodes of diarrhea every now and then. She denied having any significant chest pain, shortness breath, cough or hemoptysis. She denied having any weight loss or night sweats. The patient had repeat CT scan of the chest, abdomen and pelvis performed recently and she is here for evaluation and discussion of her scan results.  MEDICAL HISTORY: Past Medical History  Diagnosis Date  . Lung cancer     right lower lobe adenocarcinoma  . History of radiation therapy 05/02/11 to 06/08/11    right lung    ALLERGIES:  is allergic to codeine.  MEDICATIONS:  Current Outpatient Prescriptions  Medication Sig Dispense Refill  . acetaminophen (TYLENOL) 325 MG tablet Take 650 mg by mouth every 6 (six) hours as needed. For pain      . azithromycin (ZITHROMAX) 500 MG tablet Take 1 tablet (500 mg total) by mouth daily.  5 tablet  0  . benzonatate (TESSALON)  100 MG capsule 1 or 2 by mouth every eight hours as needed for cough  30 capsule  2  . chlorpheniramine-HYDROcodone (TUSSIONEX) 10-8 MG/5ML LQCR Take 5 mLs by mouth every 12 (twelve) hours as needed. For cough      . dexamethasone (DECADRON) 4 MG tablet Take 4 mg by mouth 2 (two) times daily. 1 tab PO twice daily day before, day of, and day after chemotherapy      . erlotinib (TARCEVA) 150 MG tablet Take 1 tablet (150 mg total) by mouth daily.  30 tablet  0  . fluticasone (CUTIVATE) 0.05 % cream       . folic acid (FOLVITE) 1 MG tablet Take 1 tablet (1 mg total) by mouth daily.  30 tablet  3  . HYDROcodone-acetaminophen (VICODIN) 5-500 MG per tablet       . HYDROcodone-homatropine (HYCODAN) 5-1.5 MG/5ML syrup Take 5 mLs by mouth every 6 (six) hours as needed for cough.  240 mL  0  . ibuprofen (ADVIL,MOTRIN) 100 MG tablet Take 200 mg by mouth every 6 (six) hours as needed. For pain      . ipratropium (ATROVENT) 0.03 % nasal spray Place 1 spray into the nose 2 (two) times daily.  30 mL  2  . lidocaine-prilocaine (EMLA) cream Apply to Port-A-Cath prior to tx PRN  5 g  prn 1 year  . methylPREDNIsolone (MEDROL DOSPACK) 4 MG tablet       . Multiple Vitamin (MULTIVITAMIN) tablet Take 1 tablet by  mouth daily.        . predniSONE (DELTASONE) 20 MG tablet 3/2/1 single daily dose for 3 days  6 tablet  0  . prochlorperazine (COMPAZINE) 10 MG tablet Take 10 mg by mouth every 6 (six) hours as needed. For nausea       No current facility-administered medications for this visit.    SURGICAL HISTORY:  Past Surgical History  Procedure Laterality Date  . Lung lobectomy  04/18/2009    RLL  . Portacath placement  02/29/2012    Procedure: INSERTION PORT-A-CATH;  Surgeon: Ines Bloomer, MD;  Location: [facility] OR;  Service: Thoracic;  Laterality: Left;  PowerPort 8 F attachable in left internal jugular.    REVIEW OF SYSTEMS:  A comprehensive review of systems was negative except for: Gastrointestinal: positive for  diarrhea Skin rash mainly on the face   PHYSICAL EXAMINATION: General appearance: alert, cooperative and no distress Head: Normocephalic, without obvious abnormality, atraumatic Neck: no adenopathy Lymph nodes: Cervical, supraclavicular, and axillary nodes normal. Resp: clear to auscultation bilaterally Cardio: regular rate and rhythm, S1, S2 normal, no murmur, click, rub or gallop GI: soft, non-tender; bowel sounds normal; no masses,  no organomegaly Extremities: extremities normal, atraumatic, no cyanosis or edema Neurologic: Alert and oriented X 3, normal strength and tone. Normal symmetric reflexes. Normal coordination and gait  ECOG PERFORMANCE STATUS: 1 - Symptomatic but completely ambulatory  Blood pressure 115/75, pulse 93, temperature 98.7 F (37.1 C), temperature source Oral, resp. rate 20, height 5' (1.524 m), weight 126 lb 6.4 oz (57.335 kg).  LABORATORY DATA: Lab Results  Component Value Date   WBC 7.1 02/04/2013   HGB 10.8* 02/04/2013   HCT 34.0* 02/04/2013   MCV 76.0* 02/04/2013   PLT 337 02/04/2013      Chemistry      Component Value Date/Time   NA 142 01/07/2013 1453   NA 137 07/12/2012 1121   NA 141 01/06/2012 0804   K 3.7 01/07/2013 1453   K 3.9 07/12/2012 1121   K 4.6 01/06/2012 0804   CL 108* 01/07/2013 1453   CL 104 07/12/2012 1121   CL 98 01/06/2012 0804   CO2 26 01/07/2013 1453   CO2 24 07/12/2012 1121   CO2 28 01/06/2012 0804   BUN 9.9 01/07/2013 1453   BUN 8 07/12/2012 1121   BUN 10 01/06/2012 0804   CREATININE 0.7 01/07/2013 1453   CREATININE 0.45* 07/12/2012 1121   CREATININE 0.6 01/06/2012 0804      Component Value Date/Time   CALCIUM 8.6 01/07/2013 1453   CALCIUM 8.6 07/12/2012 1121   CALCIUM 8.9 01/06/2012 0804   ALKPHOS 98 01/07/2013 1453   ALKPHOS 93 07/12/2012 1121   ALKPHOS 64 01/06/2012 0804   AST 32 01/07/2013 1453   AST 44* 07/12/2012 1121   AST 27 01/06/2012 0804   ALT 32 01/07/2013 1453   ALT 49* 07/12/2012 1121   BILITOT 0.31 01/07/2013 1453   BILITOT 0.3  07/12/2012 1121   BILITOT 0.60 01/06/2012 0804       RADIOGRAPHIC STUDIES: Ct Chest W Contrast  02/01/2013  *RADIOLOGY REPORT*  Clinical Data:  History of lung cancer.  Restaging scan.  CT CHEST, ABDOMEN AND PELVIS WITH CONTRAST  Technique:  Multidetector CT imaging of the chest, abdomen and pelvis was performed following the standard protocol during bolus administration of intravenous contrast.  Contrast: OMNIPAQUE IOHEXOL 300 MG/ML  SOLN  Comparison:  CT of the chest abdomen and pelvis 10/15/2012.  CT CHEST  Findings:  Mediastinum: Heart size is borderline enlarged. Trace amount of pericardial fluid and/or thickening, similar to the prior examination an unlikely to be of hemodynamic significance.  Left- sided internal jugular single lumen Port-A-Cath with tip terminating in the right atrium. No pathologically enlarged mediastinal or left hilar lymph nodes. Prominent soft tissue is noted in the right hilar region, without a definite enlarged lymph node or a clearly defined mass (similar to prior, favored to reflects some chronic postradiation scarring).  Esophagus is unremarkable in appearance.  Lungs/Pleura: Previously noted right upper lobe mass is smaller than the prior examination, now an irregular shaped 1.4 x 2.1 cm nodule with macrolobulated and partially spiculated margins best demonstrated on image 14 of series 5.  Postoperative changes of right lower lobectomy are redemonstrated.  Postoperative and/or postradiation scarring in the right hilar region is similar to the prior examination.  There continues to be a moderate sized right- sided partially loculated pleural effusion which is relatively simple in appearance, although there are some areas of enhancement along the pleural surface which are similar to the prior examination.  No definite pleural-based mass is confidently identified at this time.  No acute consolidative airspace disease.  Musculoskeletal: Old healed left clavicular fracture.  There are no aggressive appearing lytic or blastic lesions noted in the visualized portions of the skeleton.  IMPRESSION:  1.  Today's study appears to demonstrate a positive response to therapy with decreased size of a pleural-based 1.4 x 2.1 cm macrolobulated nodule in the posterior aspect of the right upper lobe.  This remains concerning for residual disease and warrants continued attention on follow-up studies. 2.  Moderate partially loculated chronic right-sided pleural effusion with areas of pleural enhancement, similar to the prior examination. 3.  Additional incidental findings, similar to prior studies, as above.  CT ABDOMEN AND PELVIS  Findings:  Abdomen/Pelvis: The appearance of the liver, gallbladder, pancreas, spleen, bilateral adrenal glands and bilateral kidneys is unremarkable.  No significant volume of ascites.  No pneumoperitoneum.  No pathologic distension of small bowel.  The uterus is again massively enlarged and very heterogeneous in appearance, compatible with multifocal fibroids.  Overall, this is very similar to the prior examination measuring approximately this is very similar to the prior examination measuring approximately 19.9 x 11.8 cm (image 87 of series 2).  Ovaries are unremarkable in appearance.  No definite pathologic lymphadenopathy identified within the abdomen or pelvis.  Urinary bladder is unremarkable in appearance.  Musculoskeletal: There are no aggressive appearing lytic or blastic lesions noted in the visualized portions of the skeleton.  IMPRESSION:  1.  No findings to suggest metastatic disease to the abdomen or pelvis on today's examination. 2.  Massively enlarged and heterogeneous fibroid uterus redemonstrated.   Original Report Authenticated By: Trudie Reed, M.D.     ASSESSMENT: This is a very pleasant 52 years old Asian female with recurrent non-small cell lung cancer, adenocarcinoma, currently on treatment with Tarceva 150 mg by mouth daily for the last 6  months with further improvement in her disease.  PLAN: I discussed the scan results with the patient and her daughter. I recommended for her to continue treatment with Tarceva with the current dose. For diarrhea, advice the patient to take Imodium 1-2 mg by mouth after every episodes of diarrhea with a maximum of 16 mg a day. For the skin rash rash, the patient will continue on clindamycin lotion twice a day. I would also order doxycycline 100 mg by mouth twice a day  for the next 10 days. She would come back for followup visit in one month's for reevaluation and management any adverse effect of her treatment. She was advised to call immediately if she has any concerning symptoms in the interval.  All questions were answered. The patient knows to call the clinic with any problems, questions or concerns. We can certainly see the patient much sooner if necessary.  I spent 15 minutes counseling the patient face to face. The total time spent in the appointment was 25 minutes.

## 2013-02-04 NOTE — Telephone Encounter (Signed)
gv and printed appt schedule for April °

## 2013-02-06 ENCOUNTER — Other Ambulatory Visit: Payer: Self-pay | Admitting: Medical Oncology

## 2013-02-06 DIAGNOSIS — L270 Generalized skin eruption due to drugs and medicaments taken internally: Secondary | ICD-10-CM

## 2013-02-06 NOTE — Telephone Encounter (Signed)
Called in doxycycline as son said rx not available

## 2013-02-19 ENCOUNTER — Other Ambulatory Visit: Payer: Self-pay | Admitting: *Deleted

## 2013-02-19 ENCOUNTER — Telehealth: Payer: Self-pay | Admitting: Medical Oncology

## 2013-02-19 MED ORDER — ERLOTINIB HCL 150 MG PO TABS: 150.0000 mg | ORAL_TABLET | Freq: Every day | ORAL | Status: DC

## 2013-02-19 NOTE — Telephone Encounter (Signed)
THIS REFILL REQUEST FOR TARCEVA WAS GIVEN TO DR.[name]'S NURSE, DIANE BELL,RN. 

## 2013-02-19 NOTE — Telephone Encounter (Signed)
Faxed tarceva to [facility]

## 2013-03-04 ENCOUNTER — Telehealth: Payer: Self-pay | Admitting: Internal Medicine

## 2013-03-04 ENCOUNTER — Other Ambulatory Visit (HOSPITAL_BASED_OUTPATIENT_CLINIC_OR_DEPARTMENT_OTHER): Payer: 59 | Admitting: Lab

## 2013-03-04 ENCOUNTER — Ambulatory Visit (HOSPITAL_BASED_OUTPATIENT_CLINIC_OR_DEPARTMENT_OTHER): Payer: 59 | Admitting: Physician Assistant

## 2013-03-04 VITALS — BP 122/83 | HR 102 | Temp 98.1°F | Resp 20 | Ht 60.0 in | Wt 128.6 lb

## 2013-03-04 DIAGNOSIS — C3430 Malignant neoplasm of lower lobe, unspecified bronchus or lung: Secondary | ICD-10-CM

## 2013-03-04 DIAGNOSIS — R21 Rash and other nonspecific skin eruption: Secondary | ICD-10-CM

## 2013-03-04 DIAGNOSIS — C3490 Malignant neoplasm of unspecified part of unspecified bronchus or lung: Secondary | ICD-10-CM

## 2013-03-04 LAB — CBC WITH DIFFERENTIAL/PLATELET
BASO%: 0.5 % (ref 0.0–2.0)
Basophils Absolute: 0 10*3/uL (ref 0.0–0.1)
EOS%: 1.2 % (ref 0.0–7.0)
Eosinophils Absolute: 0.1 10*3/uL (ref 0.0–0.5)
HCT: 36.4 % (ref 34.8–46.6)
HGB: 11.6 g/dL (ref 11.6–15.9)
LYMPH%: 19.1 % (ref 14.0–49.7)
MCH: 24.2 pg — ABNORMAL LOW (ref 25.1–34.0)
MCHC: 31.7 g/dL (ref 31.5–36.0)
MCV: 76.3 fL — ABNORMAL LOW (ref 79.5–101.0)
MONO#: 0.4 10*3/uL (ref 0.1–0.9)
MONO%: 4.9 % (ref 0.0–14.0)
NEUT#: 5.8 10*3/uL (ref 1.5–6.5)
NEUT%: 74.3 % (ref 38.4–76.8)
Platelets: 308 10*3/uL (ref 145–400)
RBC: 4.77 10*6/uL (ref 3.70–5.45)
RDW: 16.8 % — ABNORMAL HIGH (ref 11.2–14.5)
WBC: 7.8 10*3/uL (ref 3.9–10.3)
lymph#: 1.5 10*3/uL (ref 0.9–3.3)

## 2013-03-04 LAB — COMPREHENSIVE METABOLIC PANEL (CC13)
ALT: 23 U/L (ref 0–55)
AST: 27 U/L (ref 5–34)
Albumin: 3.4 g/dL — ABNORMAL LOW (ref 3.5–5.0)
Alkaline Phosphatase: 100 U/L (ref 40–150)
BUN: 12.9 mg/dL (ref 7.0–26.0)
CO2: 25 mEq/L (ref 22–29)
Calcium: 8.7 mg/dL (ref 8.4–10.4)
Chloride: 107 mEq/L (ref 98–107)
Creatinine: 0.7 mg/dL (ref 0.6–1.1)
Glucose: 144 mg/dl — ABNORMAL HIGH (ref 70–99)
Potassium: 3.9 mEq/L (ref 3.5–5.1)
Sodium: 141 mEq/L (ref 136–145)
Total Bilirubin: 0.48 mg/dL (ref 0.20–1.20)
Total Protein: 7.4 g/dL (ref 6.4–8.3)

## 2013-03-04 NOTE — Telephone Encounter (Signed)
Gave pt appt for lab, MD and flush for April 2014

## 2013-03-04 NOTE — Patient Instructions (Addendum)
Continue taking Tarceva, 150 mg by mouth daily Followup in one month for a symptom management visit

## 2013-03-09 NOTE — Progress Notes (Signed)
[facility] Telephone:(336) [phone]   Fax:(336) [phone]  OFFICE PROGRESS NOTE  Lajuana Matte., MD [address] [place] [address]  PRINCIPAL DIAGNOSIS: Local recurrence of non-small cell lung cancer, adenocarcinoma, initially diagnosed as stage IB (T2a N0 M0) in March of 2010.   PRIOR THERAPY:  1. Status post right lower lobectomy under the care of Dr. Laneta Simmers on 04/08/2009. 2. Status post palliative radiotherapy to the right lower lobe recurrent lung mass under the care of Dr. Mitzi Hansen, completed on June 08, 2011. 3. Systemic chemotherapy with carboplatin for AUC of 5 and Alimta 500 mg/M2 every 3 weeks. She is status post 3 cycles.  CURRENT THERAPY: Tarceva 150 mg by mouth daily, therapy beginning 07/24/2012. Status post approximately 7 months of therapy.  INTERVAL HISTORY: [name] 52 y.o. female returns to the clinic today for followup visit accompanied her son. The patient is tolerating her treatment with Tarceva fairly well with no significant adverse effects except for mild skin rash on the face as well as few episodes of diarrhea every now and then. She has noticed a few larger acneform lesions along her eyebrows and on her chin. These lesions are a bit tender. She denied  having any significant chest pain, shortness breath, cough or hemoptysis. She denied having any weight loss or night sweats.   MEDICAL HISTORY: Past Medical History  Diagnosis Date  . Lung cancer     right lower lobe adenocarcinoma  . History of radiation therapy 05/02/11 to 06/08/11    right lung    ALLERGIES:  is allergic to codeine.  MEDICATIONS:  Current Outpatient Prescriptions  Medication Sig Dispense Refill  . acetaminophen (TYLENOL) 325 MG tablet Take 650 mg by mouth every 6 (six) hours as needed. For pain      . benzonatate (TESSALON) 100 MG capsule 1 or 2 by mouth every eight hours as needed for cough  30 capsule  2  . chlorpheniramine-HYDROcodone (TUSSIONEX) 10-8  MG/5ML LQCR Take 5 mLs by mouth every 12 (twelve) hours as needed. For cough      . dexamethasone (DECADRON) 4 MG tablet Take 4 mg by mouth 2 (two) times daily. 1 tab PO twice daily day before, day of, and day after chemotherapy      . doxycycline (VIBRA-TABS) 100 MG tablet Take 1 tablet (100 mg total) by mouth 2 (two) times daily.  20 tablet  0  . erlotinib (TARCEVA) 150 MG tablet Take 1 tablet (150 mg total) by mouth daily.  30 tablet  0  . fluticasone (CUTIVATE) 0.05 % cream       . folic acid (FOLVITE) 1 MG tablet Take 1 tablet (1 mg total) by mouth daily.  30 tablet  3  . HYDROcodone-homatropine (HYCODAN) 5-1.5 MG/5ML syrup Take 5 mLs by mouth every 6 (six) hours as needed for cough.  240 mL  0  . ibuprofen (ADVIL,MOTRIN) 100 MG tablet Take 200 mg by mouth every 6 (six) hours as needed. For pain      . ipratropium (ATROVENT) 0.03 % nasal spray Place 1 spray into the nose 2 (two) times daily.  30 mL  2  . lidocaine-prilocaine (EMLA) cream Apply to Port-A-Cath prior to tx PRN  5 g  prn 1 year  . Multiple Vitamin (MULTIVITAMIN) tablet Take 1 tablet by mouth daily.        . prochlorperazine (COMPAZINE) 10 MG tablet Take 10 mg by mouth every 6 (six) hours as needed.  For nausea       No current facility-administered medications for this visit.    SURGICAL HISTORY:  Past Surgical History  Procedure Laterality Date  . Lung lobectomy  04/18/2009    RLL  . Portacath placement  02/29/2012    Procedure: INSERTION PORT-A-CATH;  Surgeon: Ines Bloomer, MD;  Location: [facility] OR;  Service: Thoracic;  Laterality: Left;  PowerPort 8 F attachable in left internal jugular.    REVIEW OF SYSTEMS:  A comprehensive review of systems was negative except for: Gastrointestinal: positive for diarrhea Skin rash mainly on the face   PHYSICAL EXAMINATION: General appearance: alert, cooperative and no distress Head: Normocephalic, without obvious abnormality, atraumatic Neck: no adenopathy Lymph nodes: Cervical,  supraclavicular, and axillary nodes normal. Resp: clear to auscultation bilaterally Cardio: regular rate and rhythm, S1, S2 normal, no murmur, click, rub or gallop GI: soft, non-tender; bowel sounds normal; no masses,  no organomegaly Extremities: extremities normal, atraumatic, no cyanosis or edema Neurologic: Alert and oriented X 3, normal strength and tone. Normal symmetric reflexes. Normal coordination and gait Skin: acneforem eruptions along eyebrow line and on the chin, no evidence of superinfection  ECOG PERFORMANCE STATUS: 1 - Symptomatic but completely ambulatory  Blood pressure 122/83, pulse 102, temperature 98.1 F (36.7 C), temperature source Oral, resp. rate 20, height 5' (1.524 m), weight 128 lb 9.6 oz (58.333 kg).  LABORATORY DATA: Lab Results  Component Value Date   WBC 7.8 03/04/2013   HGB 11.6 03/04/2013   HCT 36.4 03/04/2013   MCV 76.3* 03/04/2013   PLT 308 03/04/2013      Chemistry      Component Value Date/Time   NA 141 03/04/2013 1114   NA 137 07/12/2012 1121   NA 141 01/06/2012 0804   K 3.9 03/04/2013 1114   K 3.9 07/12/2012 1121   K 4.6 01/06/2012 0804   CL 107 03/04/2013 1114   CL 104 07/12/2012 1121   CL 98 01/06/2012 0804   CO2 25 03/04/2013 1114   CO2 24 07/12/2012 1121   CO2 28 01/06/2012 0804   BUN 12.9 03/04/2013 1114   BUN 8 07/12/2012 1121   BUN 10 01/06/2012 0804   CREATININE 0.7 03/04/2013 1114   CREATININE 0.45* 07/12/2012 1121   CREATININE 0.6 01/06/2012 0804      Component Value Date/Time   CALCIUM 8.7 03/04/2013 1114   CALCIUM 8.6 07/12/2012 1121   CALCIUM 8.9 01/06/2012 0804   ALKPHOS 100 03/04/2013 1114   ALKPHOS 93 07/12/2012 1121   ALKPHOS 64 01/06/2012 0804   AST 27 03/04/2013 1114   AST 44* 07/12/2012 1121   AST 27 01/06/2012 0804   ALT 23 03/04/2013 1114   ALT 49* 07/12/2012 1121   BILITOT 0.48 03/04/2013 1114   BILITOT 0.3 07/12/2012 1121   BILITOT 0.60 01/06/2012 0804       RADIOGRAPHIC STUDIES: Ct Chest W Contrast  02/01/2013  *RADIOLOGY REPORT*  Clinical Data:   History of lung cancer.  Restaging scan.  CT CHEST, ABDOMEN AND PELVIS WITH CONTRAST  Technique:  Multidetector CT imaging of the chest, abdomen and pelvis was performed following the standard protocol during bolus administration of intravenous contrast.  Contrast: OMNIPAQUE IOHEXOL 300 MG/ML  SOLN  Comparison:  CT of the chest abdomen and pelvis 10/15/2012.  CT CHEST  Findings:  Mediastinum: Heart size is borderline enlarged. Trace amount of pericardial fluid and/or thickening, similar to the prior examination an unlikely to be of hemodynamic significance.  Left- sided  internal jugular single lumen Port-A-Cath with tip terminating in the right atrium. No pathologically enlarged mediastinal or left hilar lymph nodes. Prominent soft tissue is noted in the right hilar region, without a definite enlarged lymph node or a clearly defined mass (similar to prior, favored to reflects some chronic postradiation scarring).  Esophagus is unremarkable in appearance.  Lungs/Pleura: Previously noted right upper lobe mass is smaller than the prior examination, now an irregular shaped 1.4 x 2.1 cm nodule with macrolobulated and partially spiculated margins best demonstrated on image 14 of series 5.  Postoperative changes of right lower lobectomy are redemonstrated.  Postoperative and/or postradiation scarring in the right hilar region is similar to the prior examination.  There continues to be a moderate sized right- sided partially loculated pleural effusion which is relatively simple in appearance, although there are some areas of enhancement along the pleural surface which are similar to the prior examination.  No definite pleural-based mass is confidently identified at this time.  No acute consolidative airspace disease.  Musculoskeletal: Old healed left clavicular fracture. There are no aggressive appearing lytic or blastic lesions noted in the visualized portions of the skeleton.  IMPRESSION:  1.  Today's study appears  to demonstrate a positive response to therapy with decreased size of a pleural-based 1.4 x 2.1 cm macrolobulated nodule in the posterior aspect of the right upper lobe.  This remains concerning for residual disease and warrants continued attention on follow-up studies. 2.  Moderate partially loculated chronic right-sided pleural effusion with areas of pleural enhancement, similar to the prior examination. 3.  Additional incidental findings, similar to prior studies, as above.  CT ABDOMEN AND PELVIS  Findings:  Abdomen/Pelvis: The appearance of the liver, gallbladder, pancreas, spleen, bilateral adrenal glands and bilateral kidneys is unremarkable.  No significant volume of ascites.  No pneumoperitoneum.  No pathologic distension of small bowel.  The uterus is again massively enlarged and very heterogeneous in appearance, compatible with multifocal fibroids.  Overall, this is very similar to the prior examination measuring approximately this is very similar to the prior examination measuring approximately 19.9 x 11.8 cm (image 87 of series 2).  Ovaries are unremarkable in appearance.  No definite pathologic lymphadenopathy identified within the abdomen or pelvis.  Urinary bladder is unremarkable in appearance.  Musculoskeletal: There are no aggressive appearing lytic or blastic lesions noted in the visualized portions of the skeleton.  IMPRESSION:  1.  No findings to suggest metastatic disease to the abdomen or pelvis on today's examination. 2.  Massively enlarged and heterogeneous fibroid uterus redemonstrated.   Original Report Authenticated By: Trudie Reed, M.D.     ASSESSMENT/PLAN: This is a very pleasant 52 years old Asian female with recurrent non-small cell lung cancer, adenocarcinoma, currently on treatment with Tarceva 150 mg by mouth daily for the last 7 months with further improvement in her disease. The patient was discussed with Dr. Arbutus Ped. She will continue on Tarceva 150 mg by mouth daily. She  will follow up in one month with a repeat CBC differential and CMET and port a cath flush.  Nani Ingram E, PA-C   She was advised to call immediately if she has any concerning symptoms in the interval.  All questions were answered. The patient knows to call the clinic with any problems, questions or concerns. We can certainly see the patient much sooner if necessary.  I spent 20 minutes counseling the patient face to face. The total time spent in the appointment was 30 minutes.

## 2013-03-26 ENCOUNTER — Other Ambulatory Visit: Payer: Self-pay | Admitting: Medical Oncology

## 2013-03-26 NOTE — Telephone Encounter (Signed)
Son reports pt complains of a sore throat especially when she swallows solid food or coughs. She denies fever , signs of thrush. I recommended that she gargle with salt water 4-5 times a day.

## 2013-04-01 ENCOUNTER — Other Ambulatory Visit: Payer: Self-pay | Admitting: *Deleted

## 2013-04-01 ENCOUNTER — Other Ambulatory Visit: Payer: 59 | Admitting: Lab

## 2013-04-01 ENCOUNTER — Ambulatory Visit: Payer: 59 | Admitting: Internal Medicine

## 2013-04-01 DIAGNOSIS — C3490 Malignant neoplasm of unspecified part of unspecified bronchus or lung: Secondary | ICD-10-CM

## 2013-04-02 ENCOUNTER — Ambulatory Visit (HOSPITAL_BASED_OUTPATIENT_CLINIC_OR_DEPARTMENT_OTHER): Payer: 59 | Admitting: Internal Medicine

## 2013-04-02 ENCOUNTER — Telehealth: Payer: Self-pay | Admitting: Internal Medicine

## 2013-04-02 ENCOUNTER — Other Ambulatory Visit (HOSPITAL_BASED_OUTPATIENT_CLINIC_OR_DEPARTMENT_OTHER): Payer: 59 | Admitting: Lab

## 2013-04-02 ENCOUNTER — Encounter: Payer: Self-pay | Admitting: Internal Medicine

## 2013-04-02 ENCOUNTER — Ambulatory Visit: Payer: 59

## 2013-04-02 VITALS — BP 118/83 | HR 100 | Temp 98.6°F | Resp 20

## 2013-04-02 VITALS — BP 118/82 | HR 102 | Temp 97.7°F | Resp 18 | Ht 60.0 in | Wt 124.9 lb

## 2013-04-02 DIAGNOSIS — J45909 Unspecified asthma, uncomplicated: Secondary | ICD-10-CM

## 2013-04-02 DIAGNOSIS — R197 Diarrhea, unspecified: Secondary | ICD-10-CM

## 2013-04-02 DIAGNOSIS — R21 Rash and other nonspecific skin eruption: Secondary | ICD-10-CM

## 2013-04-02 DIAGNOSIS — C3430 Malignant neoplasm of lower lobe, unspecified bronchus or lung: Secondary | ICD-10-CM

## 2013-04-02 DIAGNOSIS — C3490 Malignant neoplasm of unspecified part of unspecified bronchus or lung: Secondary | ICD-10-CM

## 2013-04-02 LAB — CBC WITH DIFFERENTIAL/PLATELET
BASO%: 0.4 % (ref 0.0–2.0)
Basophils Absolute: 0 10*3/uL (ref 0.0–0.1)
EOS%: 0.8 % (ref 0.0–7.0)
Eosinophils Absolute: 0.1 10*3/uL (ref 0.0–0.5)
HCT: 36 % (ref 34.8–46.6)
HGB: 11.5 g/dL — ABNORMAL LOW (ref 11.6–15.9)
LYMPH%: 17.1 % (ref 14.0–49.7)
MCH: 24 pg — ABNORMAL LOW (ref 25.1–34.0)
MCHC: 31.9 g/dL (ref 31.5–36.0)
MCV: 75.2 fL — ABNORMAL LOW (ref 79.5–101.0)
MONO#: 0.5 10*3/uL (ref 0.1–0.9)
MONO%: 5.2 % (ref 0.0–14.0)
NEUT#: 7.4 10*3/uL — ABNORMAL HIGH (ref 1.5–6.5)
NEUT%: 76.5 % (ref 38.4–76.8)
Platelets: 378 10*3/uL (ref 145–400)
RBC: 4.79 10*6/uL (ref 3.70–5.45)
RDW: 16.6 % — ABNORMAL HIGH (ref 11.2–14.5)
WBC: 9.7 10*3/uL (ref 3.9–10.3)
lymph#: 1.7 10*3/uL (ref 0.9–3.3)

## 2013-04-02 LAB — COMPREHENSIVE METABOLIC PANEL (CC13)
ALT: 52 U/L (ref 0–55)
AST: 38 U/L — ABNORMAL HIGH (ref 5–34)
Albumin: 3.2 g/dL — ABNORMAL LOW (ref 3.5–5.0)
Alkaline Phosphatase: 130 U/L (ref 40–150)
BUN: 10 mg/dL (ref 7.0–26.0)
CO2: 30 mEq/L — ABNORMAL HIGH (ref 22–29)
Calcium: 8.9 mg/dL (ref 8.4–10.4)
Chloride: 102 mEq/L (ref 98–107)
Creatinine: 0.6 mg/dL (ref 0.6–1.1)
Glucose: 135 mg/dl — ABNORMAL HIGH (ref 70–99)
Potassium: 3.4 mEq/L — ABNORMAL LOW (ref 3.5–5.1)
Sodium: 141 mEq/L (ref 136–145)
Total Bilirubin: 0.6 mg/dL (ref 0.20–1.20)
Total Protein: 7.4 g/dL (ref 6.4–8.3)

## 2013-04-02 MED ORDER — ALBUTEROL SULFATE HFA 108 (90 BASE) MCG/ACT IN AERS: 2.0000 | INHALATION_SPRAY | Freq: Four times a day (QID) | RESPIRATORY_TRACT | Status: DC | PRN

## 2013-04-02 MED ORDER — HEPARIN SOD (PORK) LOCK FLUSH 100 UNIT/ML IV SOLN
500.0000 [IU] | Freq: Once | INTRAVENOUS | Status: AC
  Administered 2013-04-02: 500 [IU] via INTRAVENOUS
  Filled 2013-04-02: qty 5

## 2013-04-02 MED ORDER — AZITHROMYCIN 250 MG PO TABS: ORAL_TABLET | ORAL | Status: DC

## 2013-04-02 MED ORDER — SODIUM CHLORIDE 0.9 % IJ SOLN
10.0000 mL | INTRAMUSCULAR | Status: DC | PRN
  Administered 2013-04-02: 10 mL via INTRAVENOUS
  Filled 2013-04-02: qty 10

## 2013-04-02 NOTE — Progress Notes (Signed)
[facility] [phone]   Fax:(336) [phone]  OFFICE PROGRESS NOTE  Kathryn Matte., MD [address] [place] [address]  PRINCIPAL DIAGNOSIS: Local recurrence of non-small cell lung cancer, adenocarcinoma, initially diagnosed as stage IB (T2a N0 M0) in March of 2010.   PRIOR THERAPY:  1. Status post right lower lobectomy under the care of Dr. Laneta Simmers on 04/08/2009. 2. Status post palliative radiotherapy to the right lower lobe recurrent lung mass under the care of Dr. Mitzi Hansen, completed on June 08, 2011. 3. Systemic chemotherapy with carboplatin for AUC of 5 and Alimta 500 mg/M2 every 3 weeks. She is status post 3 cycles.  CURRENT THERAPY: Tarceva 150 mg by mouth daily, therapy beginning 07/24/2012. Status post approximately 8 months of therapy.   INTERVAL HISTORY: [name] 52 y.o. female returns to the clinic today for followup visit accompanied by her daughter. The patient is feeling fine today with no specific complaints except for chest congestion and questionable acute bronchitis. She denied having any significant fever or chills. She denied having any chest pain, shortness breath or hemoptysis. She was around 4 pounds since her last visit. She is tolerating her treatment with Tarceva fairly well except for very mild skin rash and few episodes of diarrhea every now and then.   MEDICAL HISTORY: Past Medical History  Diagnosis Date  . Lung cancer     right lower lobe adenocarcinoma  . History of radiation therapy 05/02/11 to 06/08/11    right lung    ALLERGIES:  is allergic to codeine.  MEDICATIONS:  Current Outpatient Prescriptions  Medication Sig Dispense Refill  . acetaminophen (TYLENOL) 325 MG tablet Take 650 mg by mouth every 6 (six) hours as needed. For pain      . benzonatate (TESSALON) 100 MG capsule 1 or 2 by mouth every eight hours as needed for cough  30 capsule  2  . chlorpheniramine-HYDROcodone (TUSSIONEX) 10-8 MG/5ML LQCR Take  5 mLs by mouth every 12 (twelve) hours as needed. For cough      . dexamethasone (DECADRON) 4 MG tablet Take 4 mg by mouth 2 (two) times daily. 1 tab PO twice daily day before, day of, and day after chemotherapy      . doxycycline (VIBRA-TABS) 100 MG tablet Take 1 tablet (100 mg total) by mouth 2 (two) times daily.  20 tablet  0  . erlotinib (TARCEVA) 150 MG tablet Take 1 tablet (150 mg total) by mouth daily.  30 tablet  0  . fluticasone (CUTIVATE) 0.05 % cream       . folic acid (FOLVITE) 1 MG tablet Take 1 tablet (1 mg total) by mouth daily.  30 tablet  3  . HYDROcodone-homatropine (HYCODAN) 5-1.5 MG/5ML syrup Take 5 mLs by mouth every 6 (six) hours as needed for cough.  240 mL  0  . ibuprofen (ADVIL,MOTRIN) 100 MG tablet Take 200 mg by mouth every 6 (six) hours as needed. For pain      . ipratropium (ATROVENT) 0.03 % nasal spray Place 1 spray into the nose 2 (two) times daily.  30 mL  2  . lidocaine-prilocaine (EMLA) cream Apply to Port-A-Cath prior to tx PRN  5 g  prn 1 year  . Multiple Vitamin (MULTIVITAMIN) tablet Take 1 tablet by mouth daily.        . prochlorperazine (COMPAZINE) 10 MG tablet Take 10 mg by mouth every 6 (six) hours as needed. For nausea  No current facility-administered medications for this visit.    SURGICAL HISTORY:  Past Surgical History  Procedure Laterality Date  . Lung lobectomy  04/18/2009    RLL  . Portacath placement  02/29/2012    Procedure: INSERTION PORT-A-CATH;  Surgeon: Ines Bloomer, MD;  Location: [facility] OR;  Service: Thoracic;  Laterality: Left;  PowerPort 8 F attachable in left internal jugular.    REVIEW OF SYSTEMS:  A comprehensive review of systems was negative except for: Respiratory: positive for dyspnea on exertion and wheezing Gastrointestinal: positive for diarrhea   PHYSICAL EXAMINATION: General appearance: alert, cooperative and no distress Head: Normocephalic, without obvious abnormality, atraumatic Neck: no adenopathy Lymph nodes:  Cervical, supraclavicular, and axillary nodes normal. Resp: clear to auscultation bilaterally Cardio: regular rate and rhythm, S1, S2 normal, no murmur, click, rub or gallop GI: soft, non-tender; bowel sounds normal; no masses,  no organomegaly Extremities: extremities normal, atraumatic, no cyanosis or edema Neurologic: Alert and oriented X 3, normal strength and tone. Normal symmetric reflexes. Normal coordination and gait  ECOG PERFORMANCE STATUS: 1 - Symptomatic but completely ambulatory  Blood pressure 118/82, pulse 102, temperature 97.7 F (36.5 C), temperature source Oral, resp. rate 18, height 5' (1.524 m), weight 124 lb 14.4 oz (56.654 kg).  LABORATORY DATA: Lab Results  Component Value Date   WBC 9.7 04/02/2013   HGB 11.5* 04/02/2013   HCT 36.0 04/02/2013   MCV 75.2* 04/02/2013   PLT 378 04/02/2013      Chemistry      Component Value Date/Time   NA 141 03/04/2013 1114   NA 137 07/12/2012 1121   NA 141 01/06/2012 0804   K 3.9 03/04/2013 1114   K 3.9 07/12/2012 1121   K 4.6 01/06/2012 0804   CL 107 03/04/2013 1114   CL 104 07/12/2012 1121   CL 98 01/06/2012 0804   CO2 25 03/04/2013 1114   CO2 24 07/12/2012 1121   CO2 28 01/06/2012 0804   BUN 12.9 03/04/2013 1114   BUN 8 07/12/2012 1121   BUN 10 01/06/2012 0804   CREATININE 0.7 03/04/2013 1114   CREATININE 0.45* 07/12/2012 1121   CREATININE 0.6 01/06/2012 0804      Component Value Date/Time   CALCIUM 8.7 03/04/2013 1114   CALCIUM 8.6 07/12/2012 1121   CALCIUM 8.9 01/06/2012 0804   ALKPHOS 100 03/04/2013 1114   ALKPHOS 93 07/12/2012 1121   ALKPHOS 64 01/06/2012 0804   AST 27 03/04/2013 1114   AST 44* 07/12/2012 1121   AST 27 01/06/2012 0804   ALT 23 03/04/2013 1114   ALT 49* 07/12/2012 1121   BILITOT 0.48 03/04/2013 1114   BILITOT 0.3 07/12/2012 1121   BILITOT 0.60 01/06/2012 0804       RADIOGRAPHIC STUDIES: No results found.  ASSESSMENT: This is a very pleasant 52 years old Asian female with recurrent non-small cell lung cancer currently undergoing  treatment with Tarceva 150 mg by mouth daily for the last 8 months. The patient is tolerating her treatment fairly well with no significant adverse effects except for mild skin rash and few episodes of diarrhea. She also has acute bronchitis with cough and wheezes started 2 weeks ago.  PLAN: I recommended for the patient to continue treatment with Tarceva at the current dose, 150 mg by mouth daily. She will come back for followup visit in one month with repeat CT scan of the chest for evaluation of her disease. For the acute bronchitis, I will start the patient on Z-Pak  and I also give a prescription for albuterol inhaler 2 puffs every 6 hours as needed for wheezes She was advised to call immediately if she has any concerning symptoms in the interval.  All questions were answered. The patient knows to call the clinic with any problems, questions or concerns. We can certainly see the patient much sooner if necessary.

## 2013-04-02 NOTE — Telephone Encounter (Signed)
Gave pt appt for lab before CT and MD on June 2014

## 2013-04-02 NOTE — Patient Instructions (Signed)
Continue treatment with Tarceva. Followup visit in one month with repeat CT scan of the chest. Z-Pak for acute bronchitis.

## 2013-04-24 ENCOUNTER — Telehealth: Payer: Self-pay | Admitting: Medical Oncology

## 2013-04-24 NOTE — Telephone Encounter (Signed)
Confirmed appointments.

## 2013-04-29 ENCOUNTER — Telehealth: Payer: Self-pay | Admitting: Internal Medicine

## 2013-04-29 NOTE — Telephone Encounter (Signed)
returned pt call and advised on all appts...Marland Kitchenok and awaer

## 2013-04-30 ENCOUNTER — Other Ambulatory Visit (HOSPITAL_BASED_OUTPATIENT_CLINIC_OR_DEPARTMENT_OTHER): Payer: 59 | Admitting: Lab

## 2013-04-30 ENCOUNTER — Ambulatory Visit (HOSPITAL_COMMUNITY)
Admission: RE | Admit: 2013-04-30 | Discharge: 2013-04-30 | Disposition: A | Discharge status: Home / Self Care | Payer: 59 | Source: Ambulatory Visit | Attending: Internal Medicine | Admitting: Internal Medicine

## 2013-04-30 ENCOUNTER — Encounter (HOSPITAL_COMMUNITY): Payer: Self-pay

## 2013-04-30 DIAGNOSIS — C3410 Malignant neoplasm of upper lobe, unspecified bronchus or lung: Secondary | ICD-10-CM

## 2013-04-30 DIAGNOSIS — C3430 Malignant neoplasm of lower lobe, unspecified bronchus or lung: Secondary | ICD-10-CM

## 2013-04-30 DIAGNOSIS — R059 Cough, unspecified: Secondary | ICD-10-CM | POA: Insufficient documentation

## 2013-04-30 DIAGNOSIS — Z902 Acquired absence of lung [part of]: Secondary | ICD-10-CM | POA: Insufficient documentation

## 2013-04-30 DIAGNOSIS — C3490 Malignant neoplasm of unspecified part of unspecified bronchus or lung: Secondary | ICD-10-CM

## 2013-04-30 DIAGNOSIS — R05 Cough: Secondary | ICD-10-CM | POA: Insufficient documentation

## 2013-04-30 LAB — COMPREHENSIVE METABOLIC PANEL (CC13)
ALT: 26 U/L (ref 0–55)
AST: 30 U/L (ref 5–34)
Albumin: 3.5 g/dL (ref 3.5–5.0)
Alkaline Phosphatase: 91 U/L (ref 40–150)
BUN: 15.5 mg/dL (ref 7.0–26.0)
CO2: 29 mEq/L (ref 22–29)
Calcium: 9.2 mg/dL (ref 8.4–10.4)
Chloride: 102 mEq/L (ref 98–107)
Creatinine: 0.6 mg/dL (ref 0.6–1.1)
Glucose: 95 mg/dl (ref 70–99)
Potassium: 4.8 mEq/L (ref 3.5–5.1)
Sodium: 140 mEq/L (ref 136–145)
Total Bilirubin: 0.57 mg/dL (ref 0.20–1.20)
Total Protein: 7.5 g/dL (ref 6.4–8.3)

## 2013-04-30 LAB — CBC WITH DIFFERENTIAL/PLATELET
BASO%: 0.4 % (ref 0.0–2.0)
Basophils Absolute: 0 10*3/uL (ref 0.0–0.1)
EOS%: 1.6 % (ref 0.0–7.0)
Eosinophils Absolute: 0.1 10*3/uL (ref 0.0–0.5)
HCT: 38.2 % (ref 34.8–46.6)
HGB: 12.4 g/dL (ref 11.6–15.9)
LYMPH%: 19.7 % (ref 14.0–49.7)
MCH: 24.6 pg — ABNORMAL LOW (ref 25.1–34.0)
MCHC: 32.3 g/dL (ref 31.5–36.0)
MCV: 75.9 fL — ABNORMAL LOW (ref 79.5–101.0)
MONO#: 0.6 10*3/uL (ref 0.1–0.9)
MONO%: 7.7 % (ref 0.0–14.0)
NEUT#: 5.8 10*3/uL (ref 1.5–6.5)
NEUT%: 70.6 % (ref 38.4–76.8)
Platelets: 270 10*3/uL (ref 145–400)
RBC: 5.03 10*6/uL (ref 3.70–5.45)
RDW: 18.2 % — ABNORMAL HIGH (ref 11.2–14.5)
WBC: 8.2 10*3/uL (ref 3.9–10.3)
lymph#: 1.6 10*3/uL (ref 0.9–3.3)

## 2013-04-30 MED ORDER — IOHEXOL 300 MG/ML  SOLN
80.0000 mL | Freq: Once | INTRAMUSCULAR | Status: AC | PRN
  Administered 2013-04-30: 80 mL via INTRAVENOUS

## 2013-05-07 ENCOUNTER — Encounter: Payer: Self-pay | Admitting: Internal Medicine

## 2013-05-07 ENCOUNTER — Ambulatory Visit (HOSPITAL_BASED_OUTPATIENT_CLINIC_OR_DEPARTMENT_OTHER): Payer: 59 | Admitting: Internal Medicine

## 2013-05-07 ENCOUNTER — Telehealth: Payer: Self-pay | Admitting: Internal Medicine

## 2013-05-07 VITALS — BP 135/93 | HR 96 | Temp 97.9°F | Resp 18 | Ht 60.0 in | Wt 127.0 lb

## 2013-05-07 DIAGNOSIS — C3490 Malignant neoplasm of unspecified part of unspecified bronchus or lung: Secondary | ICD-10-CM

## 2013-05-07 NOTE — Progress Notes (Signed)
[facility] Telephone:([phone]   Fax:(336) [phone]  OFFICE PROGRESS NOTE  [name]., MD [address] [place] [address]  PRINCIPAL DIAGNOSIS: Local recurrence of non-small cell lung cancer, adenocarcinoma, initially diagnosed as stage IB (T2a N0 M0) in March of 2010.   PRIOR THERAPY:  1. Status post right lower lobectomy under the care of Dr. Laneta Simmers on 04/08/2009. 2. Status post palliative radiotherapy to the right lower lobe recurrent lung mass under the care of Dr. Mitzi Hansen, completed on June 08, 2011. 3. Systemic chemotherapy with carboplatin for AUC of 5 and Alimta 500 mg/M2 every 3 weeks. She is status post 3 cycles.  CURRENT THERAPY: Tarceva 150 mg by mouth daily, therapy beginning 07/24/2012. Status post approximately 9 months of therapy.  INTERVAL HISTORY: [name] 52 y.o. female returns to the clinic today for follow up visit accompanied by her son. The patient is tolerating her treatment with Tarceva fairly well with no significant adverse effects except for mild skin rash mainly on the eyebrows. She denied having any significant diarrhea. She was treated with doxycycline but the patient discontinued the medication after one dose secondary to intolerance. She denied having any significant chest pain, shortness breath, cough or hemoptysis. She denied having any nausea or vomiting. She had repeat CT scan of the chest performed recently and she is here for evaluation and discussion of her scan results.  MEDICAL HISTORY: Past Medical History  Diagnosis Date  . Lung cancer     right lower lobe adenocarcinoma  . History of radiation therapy 05/02/11 to 06/08/11    right lung    ALLERGIES:  is allergic to codeine and doxycycline.  MEDICATIONS:  Current Outpatient Prescriptions  Medication Sig Dispense Refill  . erlotinib (TARCEVA) 150 MG tablet Take 1 tablet (150 mg total) by mouth daily.  30 tablet  0  . fluticasone (CUTIVATE) 0.05 % cream        . lidocaine-prilocaine (EMLA) cream Apply to Port-A-Cath prior to tx PRN  5 g  prn 1 year  . acetaminophen (TYLENOL) 325 MG tablet Take 650 mg by mouth every 6 (six) hours as needed. For pain      . albuterol (PROVENTIL HFA;VENTOLIN HFA) 108 (90 BASE) MCG/ACT inhaler Inhale 2 puffs into the lungs every 6 (six) hours as needed for wheezing.  1 Inhaler  2  . ibuprofen (ADVIL,MOTRIN) 100 MG tablet Take 200 mg by mouth every 6 (six) hours as needed. For pain       No current facility-administered medications for this visit.    SURGICAL HISTORY:  Past Surgical History  Procedure Laterality Date  . Lung lobectomy  04/18/2009    RLL  . Portacath placement  02/29/2012    Procedure: INSERTION PORT-A-CATH;  Surgeon: Ines Bloomer, MD;  Location: [facility] OR;  Service: Thoracic;  Laterality: Left;  PowerPort 8 F attachable in left internal jugular.    REVIEW OF SYSTEMS:  A comprehensive review of systems was negative.   PHYSICAL EXAMINATION: General appearance: alert, cooperative and no distress Head: Normocephalic, without obvious abnormality, atraumatic Neck: no adenopathy Lymph nodes: Cervical, supraclavicular, and axillary nodes normal. Resp: clear to auscultation bilaterally Cardio: regular rate and rhythm, S1, S2 normal, no murmur, click, rub or gallop GI: soft, non-tender; bowel sounds normal; no masses,  no organomegaly Extremities: extremities normal, atraumatic, no cyanosis or edema Neurologic: Alert and oriented X 3, normal strength and tone. Normal symmetric reflexes. Normal coordination and gait  ECOG PERFORMANCE STATUS: 0 - Asymptomatic  Blood pressure 135/93, pulse 96, temperature 97.9 F (36.6 C), temperature source Oral, resp. rate 18, height 5' (1.524 m), weight 127 lb (57.607 kg).  LABORATORY DATA: Lab Results  Component Value Date   WBC 8.2 04/30/2013   HGB 12.4 04/30/2013   HCT 38.2 04/30/2013   MCV 75.9* 04/30/2013   PLT 270 04/30/2013      Chemistry      Component Value  Date/Time   NA 140 04/30/2013 1002   NA 137 07/12/2012 1121   NA 141 01/06/2012 0804   K 4.8 04/30/2013 1002   K 3.9 07/12/2012 1121   K 4.6 01/06/2012 0804   CL 102 04/30/2013 1002   CL 104 07/12/2012 1121   CL 98 01/06/2012 0804   CO2 29 04/30/2013 1002   CO2 24 07/12/2012 1121   CO2 28 01/06/2012 0804   BUN 15.5 04/30/2013 1002   BUN 8 07/12/2012 1121   BUN 10 01/06/2012 0804   CREATININE 0.6 04/30/2013 1002   CREATININE 0.45* 07/12/2012 1121   CREATININE 0.6 01/06/2012 0804      Component Value Date/Time   CALCIUM 9.2 04/30/2013 1002   CALCIUM 8.6 07/12/2012 1121   CALCIUM 8.9 01/06/2012 0804   ALKPHOS 91 04/30/2013 1002   ALKPHOS 93 07/12/2012 1121   ALKPHOS 64 01/06/2012 0804   AST 30 04/30/2013 1002   AST 44* 07/12/2012 1121   AST 27 01/06/2012 0804   ALT 26 04/30/2013 1002   ALT 49* 07/12/2012 1121   BILITOT 0.57 04/30/2013 1002   BILITOT 0.3 07/12/2012 1121   BILITOT 0.60 01/06/2012 0804       RADIOGRAPHIC STUDIES: Ct Chest W Contrast  04/30/2013   *RADIOLOGY REPORT*  Clinical Data: Lung cancer diagnosed in 2010.  Chemotherapy in progress.  Radiation therapy completed.  Cough.  CT CHEST WITH CONTRAST  Technique:  Multidetector CT imaging of the chest was performed following the standard protocol during bolus administration of intravenous contrast.  Contrast: 80mL OMNIPAQUE IOHEXOL 300 MG/ML  SOLN  Comparison: Chest CT 10/12/2012 and 02/01/2013.  Findings: The left IJ Port-A-Cath is unchanged at the SVC right atrial junction.  There are no enlarged mediastinal or hilar lymph nodes.  The right perihilar scarring appears stable.  A moderate sized loculated right pleural effusion appears stable with minimal complexity.  There is no significant left pleural effusion.  Minimal pericardial thickening is unchanged.  There are stable postsurgical changes status post right lower lobe resection.  There is paramediastinal fibrosis medially in the right lung.  Within the posterior aspect of the right upper lobe, the underlying  nodularity has not significantly changed, measuring approximately 2.0 x 1.2 cm on image 17.  A tiny right lung nodule on image 17 is stable.  The left lung is clear.  The visualized upper abdomen appears unremarkable.  There are no worrisome osseous findings.  IMPRESSION:  1.  Stable size of residual right upper lobe subpleural nodule. 2.  Stable partially loculated right pleural effusion. 3.  No progressive disease demonstrated.   Original Report Authenticated By: Carey Bullocks, M.D.    ASSESSMENT: this is a very pleasant 52 years old Asian female with recurrent non-small cell lung cancer, currently on treatment with Tarceva and tolerating it fairly well with no evidence for disease progression on his recent scan.   PLAN: I discussed the scan results with the patient and her son. I recommended for her to continue treatment with Tarceva with the same  dose. The patient would come back for follow up visit in one month's for reevaluation. For the mild skin rash on the eyebrows the patient was advised to apply topical antibiotics to these areas. She was also advised to use over-the-counter isotonic eyedrops to avoid dry eyes. The patient was advised to call immediately if she has any concerning symptoms in the interval.  All questions were answered. The patient knows to call the clinic with any problems, questions or concerns. We can certainly see the patient much sooner if necessary.  I spent 15 minutes counseling the patient face to face. The total time spent in the appointment was 25 minutes.

## 2013-05-07 NOTE — Patient Instructions (Signed)
Your scan showed no evidence for disease progression. Follow up visit in one month.

## 2013-05-20 ENCOUNTER — Telehealth: Payer: Self-pay | Admitting: Medical Oncology

## 2013-05-20 NOTE — Telephone Encounter (Signed)
New back pain since yesterday in mid  lower back..She is laying down to try and ease off pain. 7/10  "feels like a broken back". When she coughs it hurts. She took advil with minimal relief. I instructed son to apply heating pad or warm towel to area and continue ibuprophen . If it gets worse she needs to go to ED. Dr Arbutus Ped notified.

## 2013-06-04 ENCOUNTER — Ambulatory Visit (HOSPITAL_BASED_OUTPATIENT_CLINIC_OR_DEPARTMENT_OTHER): Payer: 59 | Admitting: Internal Medicine

## 2013-06-04 ENCOUNTER — Telehealth: Payer: Self-pay | Admitting: Internal Medicine

## 2013-06-04 ENCOUNTER — Other Ambulatory Visit (HOSPITAL_BASED_OUTPATIENT_CLINIC_OR_DEPARTMENT_OTHER): Payer: 59 | Admitting: Lab

## 2013-06-04 ENCOUNTER — Encounter: Payer: Self-pay | Admitting: Internal Medicine

## 2013-06-04 VITALS — BP 119/84 | HR 89 | Temp 98.2°F | Resp 18 | Ht 60.0 in | Wt 129.5 lb

## 2013-06-04 DIAGNOSIS — C3490 Malignant neoplasm of unspecified part of unspecified bronchus or lung: Secondary | ICD-10-CM

## 2013-06-04 LAB — CBC WITH DIFFERENTIAL/PLATELET
BASO%: 0.4 % (ref 0.0–2.0)
Basophils Absolute: 0 10*3/uL (ref 0.0–0.1)
EOS%: 0.7 % (ref 0.0–7.0)
Eosinophils Absolute: 0.1 10*3/uL (ref 0.0–0.5)
HCT: 33.3 % — ABNORMAL LOW (ref 34.8–46.6)
HGB: 11 g/dL — ABNORMAL LOW (ref 11.6–15.9)
LYMPH%: 17 % (ref 14.0–49.7)
MCH: 25.1 pg (ref 25.1–34.0)
MCHC: 33 g/dL (ref 31.5–36.0)
MCV: 76 fL — ABNORMAL LOW (ref 79.5–101.0)
MONO#: 0.6 10*3/uL (ref 0.1–0.9)
MONO%: 6.8 % (ref 0.0–14.0)
NEUT#: 6.3 10*3/uL (ref 1.5–6.5)
NEUT%: 75.1 % (ref 38.4–76.8)
Platelets: 308 10*3/uL (ref 145–400)
RBC: 4.39 10*6/uL (ref 3.70–5.45)
RDW: 17.6 % — ABNORMAL HIGH (ref 11.2–14.5)
WBC: 8.3 10*3/uL (ref 3.9–10.3)
lymph#: 1.4 10*3/uL (ref 0.9–3.3)

## 2013-06-04 LAB — COMPREHENSIVE METABOLIC PANEL (CC13)
ALT: 25 U/L (ref 0–55)
AST: 30 U/L (ref 5–34)
Albumin: 3.4 g/dL — ABNORMAL LOW (ref 3.5–5.0)
Alkaline Phosphatase: 78 U/L (ref 40–150)
BUN: 10.5 mg/dL (ref 7.0–26.0)
CO2: 27 mEq/L (ref 22–29)
Calcium: 8.6 mg/dL (ref 8.4–10.4)
Chloride: 107 mEq/L (ref 98–109)
Creatinine: 0.6 mg/dL (ref 0.6–1.1)
Glucose: 85 mg/dl (ref 70–140)
Potassium: 3.9 mEq/L (ref 3.5–5.1)
Sodium: 140 mEq/L (ref 136–145)
Total Bilirubin: 0.66 mg/dL (ref 0.20–1.20)
Total Protein: 7 g/dL (ref 6.4–8.3)

## 2013-06-04 MED ORDER — DOXYCYCLINE HYCLATE 100 MG PO TABS: 100.0000 mg | ORAL_TABLET | Freq: Two times a day (BID) | ORAL | Status: DC

## 2013-06-04 NOTE — Patient Instructions (Signed)
Continue treatment with Tarceva Followup visit in one month. 

## 2013-06-04 NOTE — Progress Notes (Signed)
[facility] Telephone:(336) [phone]   Fax:(336) [phone]  OFFICE PROGRESS NOTE  Lajuana Matte., MD [address] [place] [address]  PRINCIPAL DIAGNOSIS: Local recurrence of non-small cell lung cancer, adenocarcinoma, initially diagnosed as stage IB (T2a N0 M0) in March of 2010.   PRIOR THERAPY:  1. Status post right lower lobectomy under the care of Dr. Laneta Simmers on 04/08/2009. 2. Status post palliative radiotherapy to the right lower lobe recurrent lung mass under the care of Dr. Mitzi Hansen, completed on June 08, 2011. 3. Systemic chemotherapy with carboplatin for AUC of 5 and Alimta 500 mg/M2 every 3 weeks. She is status post 3 cycles.  CURRENT THERAPY: Tarceva 150 mg by mouth daily, therapy beginning 07/24/2012. Status post approximately 10 months of therapy.  INTERVAL HISTORY: [name] 52 y.o. female returns to the clinic today for followup visit accompanied her son. The patient is feeling fine today with no specific complaints except for small pimples on the eyebrows bilaterally. She also has some soreness in her nares. She denied having any significant diarrhea. She denied having any other skin rash. She has no significant weight loss or night sweats. The patient denied having any significant chest pain, shortness breath, cough or hemoptysis. She is tolerating her treatment with Tarceva fairly well.   MEDICAL HISTORY: Past Medical History  Diagnosis Date  . Lung cancer     right lower lobe adenocarcinoma  . History of radiation therapy 05/02/11 to 06/08/11    right lung    ALLERGIES:  is allergic to codeine and doxycycline.  MEDICATIONS:  Current Outpatient Prescriptions  Medication Sig Dispense Refill  . acetaminophen (TYLENOL) 325 MG tablet Take 650 mg by mouth every 6 (six) hours as needed. For pain      . erlotinib (TARCEVA) 150 MG tablet Take 1 tablet (150 mg total) by mouth daily.  30 tablet  0  . fluticasone (CUTIVATE) 0.05 % cream       .  ibuprofen (ADVIL,MOTRIN) 100 MG tablet Take 200 mg by mouth every 6 (six) hours as needed. For pain      . lidocaine-prilocaine (EMLA) cream Apply to Port-A-Cath prior to tx PRN  5 g  prn 1 year  . albuterol (PROVENTIL HFA;VENTOLIN HFA) 108 (90 BASE) MCG/ACT inhaler Inhale 2 puffs into the lungs every 6 (six) hours as needed for wheezing.  1 Inhaler  2   No current facility-administered medications for this visit.    SURGICAL HISTORY:  Past Surgical History  Procedure Laterality Date  . Lung lobectomy  04/18/2009    RLL  . Portacath placement  02/29/2012    Procedure: INSERTION PORT-A-CATH;  Surgeon: Ines Bloomer, MD;  Location: [facility] OR;  Service: Thoracic;  Laterality: Left;  PowerPort 8 F attachable in left internal jugular.    REVIEW OF SYSTEMS:  A comprehensive review of systems was negative except for: Skin rash on the eyebrows   PHYSICAL EXAMINATION: General appearance: alert, cooperative and no distress Head: Normocephalic, without obvious abnormality, atraumatic Neck: no adenopathy Lymph nodes: Cervical, supraclavicular, and axillary nodes normal. Resp: clear to auscultation bilaterally Cardio: regular rate and rhythm, S1, S2 normal, no murmur, click, rub or gallop GI: soft, non-tender; bowel sounds normal; no masses,  no organomegaly Extremities: extremities normal, atraumatic, no cyanosis or edema  ECOG PERFORMANCE STATUS: 1 - Symptomatic but completely ambulatory  Blood pressure 119/84, pulse 89, temperature 98.2 F (36.8 C), temperature source Oral, resp. rate 18, height 5' (1.524  m), weight 129 lb 8 oz (58.741 kg), SpO2 98.00%.  LABORATORY DATA: Lab Results  Component Value Date   WBC 8.3 06/04/2013   HGB 11.0* 06/04/2013   HCT 33.3* 06/04/2013   MCV 76.0* 06/04/2013   PLT 308 06/04/2013      Chemistry      Component Value Date/Time   NA 140 06/04/2013 1111   NA 137 07/12/2012 1121   NA 141 01/06/2012 0804   K 3.9 06/04/2013 1111   K 3.9 07/12/2012 1121   K 4.6 01/06/2012  0804   CL 102 04/30/2013 1002   CL 104 07/12/2012 1121   CL 98 01/06/2012 0804   CO2 27 06/04/2013 1111   CO2 24 07/12/2012 1121   CO2 28 01/06/2012 0804   BUN 10.5 06/04/2013 1111   BUN 8 07/12/2012 1121   BUN 10 01/06/2012 0804   CREATININE 0.6 06/04/2013 1111   CREATININE 0.45* 07/12/2012 1121   CREATININE 0.6 01/06/2012 0804      Component Value Date/Time   CALCIUM 8.6 06/04/2013 1111   CALCIUM 8.6 07/12/2012 1121   CALCIUM 8.9 01/06/2012 0804   ALKPHOS 78 06/04/2013 1111   ALKPHOS 93 07/12/2012 1121   ALKPHOS 64 01/06/2012 0804   AST 30 06/04/2013 1111   AST 44* 07/12/2012 1121   AST 27 01/06/2012 0804   ALT 25 06/04/2013 1111   ALT 49* 07/12/2012 1121   BILITOT 0.66 06/04/2013 1111   BILITOT 0.3 07/12/2012 1121   BILITOT 0.60 01/06/2012 0804       RADIOGRAPHIC STUDIES: No results found.  ASSESSMENT AND PLAN: This is a very pleasant 52 years old Asian female with recurrent non-small cell lung cancer, adenocarcinoma. The patient is currently on Tarceva status post 10 months and tolerating it fairly well with no significant complaints except for small pimples on the eyebrows. I recommended for the patient to continue treatment was Tarceva with the same dose. For the skin rash I will start the patient on doxycycline 100 mg by mouth twice a day for 7 days. For the anemia, I gave the patient samples of Fusion plus capsule to be used once daily. She will come back for followup visit in one month's for reevaluation  All questions were answered. The patient knows to call the clinic with any problems, questions or concerns. We can certainly see the patient much sooner if necessary.

## 2013-06-04 NOTE — Telephone Encounter (Signed)
Gave pt appt for lab and ML on August 2014 °

## 2013-06-12 ENCOUNTER — Ambulatory Visit: Payer: 59 | Admitting: Physician Assistant

## 2013-07-08 ENCOUNTER — Ambulatory Visit: Payer: 59

## 2013-07-08 ENCOUNTER — Telehealth: Payer: Self-pay | Admitting: Internal Medicine

## 2013-07-08 ENCOUNTER — Other Ambulatory Visit (HOSPITAL_BASED_OUTPATIENT_CLINIC_OR_DEPARTMENT_OTHER): Payer: 59 | Admitting: Lab

## 2013-07-08 ENCOUNTER — Ambulatory Visit (HOSPITAL_BASED_OUTPATIENT_CLINIC_OR_DEPARTMENT_OTHER): Payer: 59 | Admitting: Physician Assistant

## 2013-07-08 VITALS — BP 114/83 | HR 98 | Temp 97.3°F | Resp 18 | Ht 60.0 in | Wt 125.4 lb

## 2013-07-08 DIAGNOSIS — C342 Malignant neoplasm of middle lobe, bronchus or lung: Secondary | ICD-10-CM

## 2013-07-08 DIAGNOSIS — C3490 Malignant neoplasm of unspecified part of unspecified bronchus or lung: Secondary | ICD-10-CM

## 2013-07-08 LAB — CBC WITH DIFFERENTIAL/PLATELET
BASO%: 0.2 % (ref 0.0–2.0)
Basophils Absolute: 0 10*3/uL (ref 0.0–0.1)
EOS%: 0.6 % (ref 0.0–7.0)
Eosinophils Absolute: 0.1 10*3/uL (ref 0.0–0.5)
HCT: 41.2 % (ref 34.8–46.6)
HGB: 13.5 g/dL (ref 11.6–15.9)
LYMPH%: 16.7 % (ref 14.0–49.7)
MCH: 26.4 pg (ref 25.1–34.0)
MCHC: 32.8 g/dL (ref 31.5–36.0)
MCV: 80.4 fL (ref 79.5–101.0)
MONO#: 0.5 10*3/uL (ref 0.1–0.9)
MONO%: 4.8 % (ref 0.0–14.0)
NEUT#: 7.3 10*3/uL — ABNORMAL HIGH (ref 1.5–6.5)
NEUT%: 77.7 % — ABNORMAL HIGH (ref 38.4–76.8)
Platelets: 324 10*3/uL (ref 145–400)
RBC: 5.13 10*6/uL (ref 3.70–5.45)
RDW: 19.5 % — ABNORMAL HIGH (ref 11.2–14.5)
WBC: 9.4 10*3/uL (ref 3.9–10.3)
lymph#: 1.6 10*3/uL (ref 0.9–3.3)

## 2013-07-08 LAB — COMPREHENSIVE METABOLIC PANEL (CC13)
ALT: 25 U/L (ref 0–55)
AST: 32 U/L (ref 5–34)
Albumin: 3.7 g/dL (ref 3.5–5.0)
Alkaline Phosphatase: 98 U/L (ref 40–150)
BUN: 13.9 mg/dL (ref 7.0–26.0)
CO2: 26 mEq/L (ref 22–29)
Calcium: 9.2 mg/dL (ref 8.4–10.4)
Chloride: 104 mEq/L (ref 98–109)
Creatinine: 0.7 mg/dL (ref 0.6–1.1)
Glucose: 104 mg/dl (ref 70–140)
Potassium: 4.2 mEq/L (ref 3.5–5.1)
Sodium: 140 mEq/L (ref 136–145)
Total Bilirubin: 0.43 mg/dL (ref 0.20–1.20)
Total Protein: 7.8 g/dL (ref 6.4–8.3)

## 2013-07-08 MED ORDER — SODIUM CHLORIDE 0.9 % IJ SOLN
10.0000 mL | INTRAMUSCULAR | Status: DC | PRN
  Filled 2013-07-08: qty 10

## 2013-07-08 MED ORDER — BENZONATATE 100 MG PO CAPS: 200.0000 mg | ORAL_CAPSULE | Freq: Three times a day (TID) | ORAL | Status: DC | PRN

## 2013-07-08 MED ORDER — SODIUM CHLORIDE 0.9 % IJ SOLN
10.0000 mL | INTRAMUSCULAR | Status: DC | PRN
  Administered 2013-07-08: 10 mL via INTRAVENOUS
  Filled 2013-07-08: qty 10

## 2013-07-08 MED ORDER — HEPARIN SOD (PORK) LOCK FLUSH 100 UNIT/ML IV SOLN
500.0000 [IU] | INTRAVENOUS | Status: DC | PRN
  Filled 2013-07-08: qty 5

## 2013-07-08 MED ORDER — HEPARIN SOD (PORK) LOCK FLUSH 100 UNIT/ML IV SOLN
500.0000 [IU] | Freq: Once | INTRAVENOUS | Status: AC
  Administered 2013-07-08: 500 [IU] via INTRAVENOUS
  Filled 2013-07-08: qty 5

## 2013-07-08 MED ORDER — HEPARIN SOD (PORK) LOCK FLUSH 100 UNIT/ML IV SOLN
250.0000 [IU] | INTRAVENOUS | Status: DC | PRN
  Filled 2013-07-08: qty 5

## 2013-07-08 NOTE — Progress Notes (Addendum)
[facility] [phone]   Fax:([phone]  OFFICE PROGRESS NOTE  Kathryn Matte., MD [address] [place] [address]  PRINCIPAL DIAGNOSIS: Local recurrence of non-small cell lung cancer, adenocarcinoma, initially diagnosed as stage IB (T2a N0 M0) in March of 2010.   PRIOR THERAPY:  1. Status post right lower lobectomy under the care of Dr. Laneta Simmers on 04/08/2009. 2. Status post palliative radiotherapy to the right lower lobe recurrent lung mass under the care of Dr. Mitzi Hansen, completed on June 08, 2011. 3. Systemic chemotherapy with carboplatin for AUC of 5 and Alimta 500 mg/M2 every 3 weeks. She is status post 3 cycles.  CURRENT THERAPY: Tarceva 150 mg by mouth daily, therapy beginning 07/24/2012. Status post approximately 11 months of therapy.  INTERVAL HISTORY: [name] 52 y.o. female returns to the clinic today for followup visit accompanied her son. The patient is feeling fine today with no specific complaints except for a nonproductive cough. Her rash has improved after her course of doxycycline. She also feels generally bit better after taking the iron supplement samples that she was given at her last visit.  She denied having any significant diarrhea. She denied having any other skin rash. She has no significant weight loss or night sweats. The patient denied having any significant chest pain, shortness breath, cough or hemoptysis. She is tolerating her treatment with Tarceva fairly well.   MEDICAL HISTORY: Past Medical History  Diagnosis Date  . Lung cancer     right lower lobe adenocarcinoma  . History of radiation therapy 05/02/11 to 06/08/11    right lung    ALLERGIES:  is allergic to codeine and doxycycline.  MEDICATIONS:  Current Outpatient Prescriptions  Medication Sig Dispense Refill  . acetaminophen (TYLENOL) 325 MG tablet Take 650 mg by mouth every 6 (six) hours as needed. For pain      . albuterol (PROVENTIL HFA;VENTOLIN HFA)  108 (90 BASE) MCG/ACT inhaler Inhale 2 puffs into the lungs every 6 (six) hours as needed for wheezing.  1 Inhaler  2  . benzonatate (TESSALON) 100 MG capsule Take 2 capsules (200 mg total) by mouth 3 (three) times daily as needed for cough.  30 capsule  1  . doxycycline (VIBRA-TABS) 100 MG tablet Take 1 tablet (100 mg total) by mouth 2 (two) times daily.  14 tablet  0  . erlotinib (TARCEVA) 150 MG tablet Take 1 tablet (150 mg total) by mouth daily.  30 tablet  0  . fluticasone (CUTIVATE) 0.05 % cream       . ibuprofen (ADVIL,MOTRIN) 100 MG tablet Take 200 mg by mouth every 6 (six) hours as needed. For pain      . lidocaine-prilocaine (EMLA) cream Apply to Port-A-Cath prior to tx PRN  5 g  prn 1 year   Current Facility-Administered Medications  Medication Dose Route Frequency Provider Last Rate Last Dose  . heparin lock flush 100 unit/mL  250 Units Intracatheter PRN Nesha Counihan E Shaheim Mahar, PA-C      . heparin lock flush 100 unit/mL  500 Units Intracatheter PRN Adaley Kiene E Tyrone Balash, PA-C      . sodium chloride 0.9 % injection 10 mL  10 mL Intracatheter PRN Mckaylin Bastien E Hannah Crill, PA-C      . sodium chloride 0.9 % injection 10 mL  10 mL Intracatheter PRN Tiny Rietz E Renesha Lizama, PA-C      . sodium chloride 0.9 % injection 10 mL  10 mL Intracatheter PRN Luellen Howson E  Laural Benes, PA-C        SURGICAL HISTORY:  Past Surgical History  Procedure Laterality Date  . Lung lobectomy  04/18/2009    RLL  . Portacath placement  02/29/2012    Procedure: INSERTION PORT-A-CATH;  Surgeon: Ines Bloomer, MD;  Location: [facility] OR;  Service: Thoracic;  Laterality: Left;  PowerPort 8 F attachable in left internal jugular.    REVIEW OF SYSTEMS:  A comprehensive review of systems was negative except for: Respiratory: positive for cough   PHYSICAL EXAMINATION: General appearance: alert, cooperative and no distress Head: Normocephalic, without obvious abnormality, atraumatic Neck: no adenopathy Lymph nodes: Cervical, supraclavicular, and axillary  nodes normal. Resp: clear to auscultation bilaterally Cardio: regular rate and rhythm, S1, S2 normal, no murmur, click, rub or gallop GI: soft, non-tender; bowel sounds normal; no masses,  no organomegaly Extremities: extremities normal, atraumatic, no cyanosis or edema  ECOG PERFORMANCE STATUS: 1 - Symptomatic but completely ambulatory  Blood pressure 114/83, pulse 98, temperature 97.3 F (36.3 C), temperature source Oral, resp. rate 18, height 5' (1.524 m), weight 125 lb 6.4 oz (56.881 kg).  LABORATORY DATA: Lab Results  Component Value Date   WBC 9.4 07/08/2013   HGB 13.5 07/08/2013   HCT 41.2 07/08/2013   MCV 80.4 07/08/2013   PLT 324 07/08/2013      Chemistry      Component Value Date/Time   NA 140 07/08/2013 1112   NA 137 07/12/2012 1121   NA 141 01/06/2012 0804   K 4.2 07/08/2013 1112   K 3.9 07/12/2012 1121   K 4.6 01/06/2012 0804   CL 102 04/30/2013 1002   CL 104 07/12/2012 1121   CL 98 01/06/2012 0804   CO2 26 07/08/2013 1112   CO2 24 07/12/2012 1121   CO2 28 01/06/2012 0804   BUN 13.9 07/08/2013 1112   BUN 8 07/12/2012 1121   BUN 10 01/06/2012 0804   CREATININE 0.7 07/08/2013 1112   CREATININE 0.45* 07/12/2012 1121   CREATININE 0.6 01/06/2012 0804      Component Value Date/Time   CALCIUM 9.2 07/08/2013 1112   CALCIUM 8.6 07/12/2012 1121   CALCIUM 8.9 01/06/2012 0804   ALKPHOS 98 07/08/2013 1112   ALKPHOS 93 07/12/2012 1121   ALKPHOS 64 01/06/2012 0804   AST 32 07/08/2013 1112   AST 44* 07/12/2012 1121   AST 27 01/06/2012 0804   ALT 25 07/08/2013 1112   ALT 49* 07/12/2012 1121   ALT 24 01/06/2012 0804   BILITOT 0.43 07/08/2013 1112   BILITOT 0.3 07/12/2012 1121   BILITOT 0.60 01/06/2012 0804       RADIOGRAPHIC STUDIES: No results found.  ASSESSMENT AND PLAN: This is a very pleasant 52 years old Asian female with recurrent non-small cell lung cancer, adenocarcinoma. The patient is currently on Tarceva status post 11 months and tolerating it fairly well with no significant complaints. Patient  was discussed with him also seen by Dr. Arbutus Ped. She complains of cough today and we will send a prescription for Tessalon Perles to her pharmacy of record for her cough symptoms. Should she develop fever or productive cough she is to notify us immediately. Further review the patient's chart reveals her Port-A-Cath has not been flushed since May 2014. We will arrange for Port-A-Cath flush. She should have her Port-A-Cath flushed at least every 8 weeks. She'll continue on her Tarceva at 150 mg by mouth daily. She'll followup with Dr. Arbutus Ped in one month with a repeat CBC differential, C.  met and CT of the chest, abdomen and pelvis with contrast to reevaluate her disease. Of note her anemia is improved and she presents today with a hemoglobin of 13.5.  Mandela Bello E, PA-C   She will come back for followup visit in one month's for reevaluation  All questions were answered. The patient knows to call the clinic with any problems, questions or concerns. We can certainly see the patient much sooner if necessary.  ADDENDUM: Hematology/Oncology Attending: I have face to face encounter with the patient today. I recommended her care plan. The patient is doing fine today with no specific complaints except for mild nonproductive cough. She is tolerating her treatment with Tarceva fairly well. I recommended for the patient to continue her current treatment with Tarceva 150 mg by mouth daily. She would come back for follow up visit in one month with repeat CT scan of the chest for restaging of her disease. For the cough the patient will be giving Tessalon pearls.  Kathryn Matte., MD 07/08/2013

## 2013-07-08 NOTE — Telephone Encounter (Signed)
Gave pt appt for lab and MD after CT, gave pt oral comtrast for CT

## 2013-07-08 NOTE — Patient Instructions (Addendum)

## 2013-07-08 NOTE — Patient Instructions (Addendum)
Continue Tarceva 150 mg by mouth daily Followup with Dr. Arbutus Ped in one month with a restaging CT scan of your chest, abdomen and pelvis to reevaluate your disease. A prescription for Jerilynn Som was being sent your pharmacy of record to address her complaints of cough.

## 2013-07-17 ENCOUNTER — Telehealth: Payer: Self-pay | Admitting: *Deleted

## 2013-07-17 NOTE — Telephone Encounter (Signed)
Pt's son called stating that [facility] pharmacy is wanting pt to pay $3000 for tarceva and they cannot afford this.  Gave Raquel's Research scientist (physical sciences)) ph# to him and he will contact her to investigate financial assistance.  SLJ

## 2013-07-17 NOTE — Telephone Encounter (Signed)
Prior authorization request received from [facility] for Tarceva 150 mg.  Request to Managed Care.

## 2013-07-18 ENCOUNTER — Encounter: Payer: Self-pay | Admitting: Internal Medicine

## 2013-07-18 NOTE — Progress Notes (Signed)
[facility], [id number], approved tarceva from 06/17/13-07/17/14.

## 2013-07-23 ENCOUNTER — Encounter: Payer: Self-pay | Admitting: Internal Medicine

## 2013-07-23 NOTE — Progress Notes (Signed)
Patient's son Ra came in. He had paystub for dad, but is was from May. I advised I needed current one or his most current bank statement. They are requesting help with meds/bills. I gave him the grant form for his mom to sign and he bring back with proof of dad's income.

## 2013-07-26 ENCOUNTER — Encounter: Payer: Self-pay | Admitting: Internal Medicine

## 2013-07-26 NOTE — Progress Notes (Signed)
Called and spoke with patient's son Ra.. She didn't sign the grant form. Ra will come by here and sign for his mom. He is ok with him signing..she speaks little english.

## 2013-07-29 ENCOUNTER — Other Ambulatory Visit: Payer: Self-pay | Admitting: Internal Medicine

## 2013-07-30 ENCOUNTER — Telehealth: Payer: Self-pay | Admitting: Medical Oncology

## 2013-07-30 DIAGNOSIS — R059 Cough, unspecified: Secondary | ICD-10-CM

## 2013-07-30 DIAGNOSIS — R05 Cough: Secondary | ICD-10-CM

## 2013-07-30 MED ORDER — HYDROCODONE-HOMATROPINE 5-1.5 MG/5ML PO SYRP: 5.0000 mL | ORAL_SOLUTION | Freq: Four times a day (QID) | ORAL | Status: DC | PRN

## 2013-07-30 NOTE — Telephone Encounter (Signed)
Per Dr Arbutus Ped I called in hycodan syrup which pt has taken before. Son notified.

## 2013-08-02 ENCOUNTER — Encounter (HOSPITAL_COMMUNITY): Payer: Self-pay

## 2013-08-02 ENCOUNTER — Other Ambulatory Visit (HOSPITAL_BASED_OUTPATIENT_CLINIC_OR_DEPARTMENT_OTHER): Payer: 59 | Admitting: Lab

## 2013-08-02 ENCOUNTER — Ambulatory Visit (HOSPITAL_COMMUNITY)
Admission: RE | Admit: 2013-08-02 | Discharge: 2013-08-02 | Disposition: A | Discharge status: Home / Self Care | Payer: 59 | Source: Ambulatory Visit | Attending: Physician Assistant | Admitting: Physician Assistant

## 2013-08-02 DIAGNOSIS — Z923 Personal history of irradiation: Secondary | ICD-10-CM | POA: Insufficient documentation

## 2013-08-02 DIAGNOSIS — C3490 Malignant neoplasm of unspecified part of unspecified bronchus or lung: Secondary | ICD-10-CM

## 2013-08-02 DIAGNOSIS — J90 Pleural effusion, not elsewhere classified: Secondary | ICD-10-CM | POA: Insufficient documentation

## 2013-08-02 DIAGNOSIS — D259 Leiomyoma of uterus, unspecified: Secondary | ICD-10-CM | POA: Insufficient documentation

## 2013-08-02 DIAGNOSIS — Z79899 Other long term (current) drug therapy: Secondary | ICD-10-CM | POA: Insufficient documentation

## 2013-08-02 DIAGNOSIS — C3430 Malignant neoplasm of lower lobe, unspecified bronchus or lung: Secondary | ICD-10-CM

## 2013-08-02 LAB — COMPREHENSIVE METABOLIC PANEL (CC13)
ALT: 34 U/L (ref 0–55)
AST: 33 U/L (ref 5–34)
Albumin: 3.7 g/dL (ref 3.5–5.0)
Alkaline Phosphatase: 101 U/L (ref 40–150)
BUN: 9.5 mg/dL (ref 7.0–26.0)
CO2: 26 mEq/L (ref 22–29)
Calcium: 9.1 mg/dL (ref 8.4–10.4)
Chloride: 104 mEq/L (ref 98–109)
Creatinine: 0.6 mg/dL (ref 0.6–1.1)
Glucose: 93 mg/dl (ref 70–140)
Potassium: 4.2 mEq/L (ref 3.5–5.1)
Sodium: 141 mEq/L (ref 136–145)
Total Bilirubin: 0.67 mg/dL (ref 0.20–1.20)
Total Protein: 7.8 g/dL (ref 6.4–8.3)

## 2013-08-02 LAB — CBC WITH DIFFERENTIAL/PLATELET
BASO%: 0.3 % (ref 0.0–2.0)
Basophils Absolute: 0 10*3/uL (ref 0.0–0.1)
EOS%: 1.5 % (ref 0.0–7.0)
Eosinophils Absolute: 0.1 10*3/uL (ref 0.0–0.5)
HCT: 41 % (ref 34.8–46.6)
HGB: 13.5 g/dL (ref 11.6–15.9)
LYMPH%: 24.3 % (ref 14.0–49.7)
MCH: 26.3 pg (ref 25.1–34.0)
MCHC: 32.9 g/dL (ref 31.5–36.0)
MCV: 79.9 fL (ref 79.5–101.0)
MONO#: 0.4 10*3/uL (ref 0.1–0.9)
MONO%: 5.8 % (ref 0.0–14.0)
NEUT#: 5.1 10*3/uL (ref 1.5–6.5)
NEUT%: 68.1 % (ref 38.4–76.8)
Platelets: 266 10*3/uL (ref 145–400)
RBC: 5.13 10*6/uL (ref 3.70–5.45)
RDW: 18.3 % — ABNORMAL HIGH (ref 11.2–14.5)
WBC: 7.5 10*3/uL (ref 3.9–10.3)
lymph#: 1.8 10*3/uL (ref 0.9–3.3)

## 2013-08-02 MED ORDER — IOHEXOL 300 MG/ML  SOLN
100.0000 mL | Freq: Once | INTRAMUSCULAR | Status: AC | PRN
  Administered 2013-08-02: 100 mL via INTRAVENOUS

## 2013-08-05 ENCOUNTER — Telehealth: Payer: Self-pay | Admitting: Internal Medicine

## 2013-08-05 ENCOUNTER — Encounter: Payer: Self-pay | Admitting: Internal Medicine

## 2013-08-05 ENCOUNTER — Ambulatory Visit (HOSPITAL_BASED_OUTPATIENT_CLINIC_OR_DEPARTMENT_OTHER): Payer: 59 | Admitting: Internal Medicine

## 2013-08-05 VITALS — BP 135/88 | HR 88 | Temp 97.9°F | Resp 18 | Ht 60.0 in | Wt 125.9 lb

## 2013-08-05 DIAGNOSIS — C3430 Malignant neoplasm of lower lobe, unspecified bronchus or lung: Secondary | ICD-10-CM

## 2013-08-05 DIAGNOSIS — C3490 Malignant neoplasm of unspecified part of unspecified bronchus or lung: Secondary | ICD-10-CM

## 2013-08-05 MED ORDER — DOXYCYCLINE HYCLATE 100 MG PO TABS: 100.0000 mg | ORAL_TABLET | Freq: Two times a day (BID) | ORAL | Status: DC

## 2013-08-05 NOTE — Telephone Encounter (Signed)
Gave pt appt for lab and ML on 10/7 date per ptr rqst

## 2013-08-05 NOTE — Progress Notes (Signed)
[name] Telephone:(336) [phone]   Fax:(336) [phone]  OFFICE PROGRESS NOTE  [name]., MD [address] [place] [address]  PRINCIPAL DIAGNOSIS: Local recurrence of non-small cell lung cancer, adenocarcinoma, initially diagnosed as stage IB (T2a N0 M0) in March of 2010.   PRIOR THERAPY:  1. Status post right lower lobectomy under the care of Dr. Laneta Simmers on 04/08/2009. 2. Status post palliative radiotherapy to the right lower lobe recurrent lung mass under the care of Dr. Mitzi Hansen, completed on June 08, 2011. 3. Systemic chemotherapy with carboplatin for AUC of 5 and Alimta 500 mg/M2 every 3 weeks. She is status post 3 cycles.  CURRENT THERAPY: Tarceva 150 mg by mouth daily, therapy beginning 07/24/2012. Status post approximately 12 months of therapy.  CHEMOTHERAPY INTENT: Palliative  CURRENT # OF CHEMOTHERAPY CYCLES: 12  CURRENT ANTIEMETICS: Compazine  CURRENT SMOKING STATUS: Nonsmoker  ORAL CHEMOTHERAPY AND CONSENT: Tarceva  CURRENT BISPHOSPHONATES USE: None  PAIN MANAGEMENT: None  NARCOTICS INDUCED CONSTIPATION: None  LIVING WILL AND CODE STATUS: Full code   INTERVAL HISTORY: [name] 52 y.o. female returns to the clinic today for followup visit accompanied by her son who is the interpreter for his mother. The patient is feeling fine today with no specific complaints except for chest congestion and cough. She is currently on treatment with Tessalon Perles in addition to [facility] with some control of her cough. The patient is tolerating her treatment with Tarceva fairly well. She denied having any significant skin rash or diarrhea. She has occasional inflammation and sores in the left eyebrow. These inflammatory process in the left eyebrow improved with doxycycline in the past and the patient requested a refill of this medication. She had repeat CT scan of the chest performed recently and she is here for evaluation and discussion of her scan  results.  MEDICAL HISTORY: Past Medical History  Diagnosis Date  . Lung cancer     right lower lobe adenocarcinoma  . History of radiation therapy 05/02/11 to 06/08/11    right lung    ALLERGIES:  is allergic to codeine and doxycycline.  MEDICATIONS:  Current Outpatient Prescriptions  Medication Sig Dispense Refill  . acetaminophen (TYLENOL) 325 MG tablet Take 650 mg by mouth every 6 (six) hours as needed. For pain      . benzonatate (TESSALON) 100 MG capsule Take 2 capsules (200 mg total) by mouth 3 (three) times daily as needed for cough.  30 capsule  1  . erlotinib (TARCEVA) 150 MG tablet Take 1 tablet (150 mg total) by mouth daily.  30 tablet  0  . fluticasone (CUTIVATE) 0.05 % cream       . ibuprofen (ADVIL,MOTRIN) 100 MG tablet Take 200 mg by mouth every 6 (six) hours as needed. For pain      . lidocaine-prilocaine (EMLA) cream Apply to Port-A-Cath prior to tx PRN  5 g  prn 1 year  . albuterol (PROVENTIL HFA;VENTOLIN HFA) 108 (90 BASE) MCG/ACT inhaler Inhale 2 puffs into the lungs every 6 (six) hours as needed for wheezing.  1 Inhaler  2  . HYDROcodone-homatropine (HYCODAN) 5-1.5 MG/5ML syrup Take 5 mLs by mouth every 6 (six) hours as needed for cough.  120 mL  0   No current facility-administered medications for this visit.    SURGICAL HISTORY:  Past Surgical History  Procedure Laterality Date  . Lung lobectomy  04/18/2009    RLL  . Portacath placement  02/29/2012  Procedure: INSERTION PORT-A-CATH;  Surgeon: Ines Bloomer, MD;  Location: [facility] OR;  Service: Thoracic;  Laterality: Left;  PowerPort 8 F attachable in left internal jugular.    REVIEW OF SYSTEMS:  A comprehensive review of systems was negative except for: Respiratory: positive for cough   PHYSICAL EXAMINATION: General appearance: alert, cooperative and no distress Head: Normocephalic, without obvious abnormality, atraumatic Neck: no adenopathy Lymph nodes: Cervical, supraclavicular, and axillary nodes  normal. Resp: clear to auscultation bilaterally Cardio: regular rate and rhythm, S1, S2 normal, no murmur, click, rub or gallop GI: soft, non-tender; bowel sounds normal; no masses,  no organomegaly Extremities: extremities normal, atraumatic, no cyanosis or edema Neurologic: Alert and oriented X 3, normal strength and tone. Normal symmetric reflexes. Normal coordination and gait  ECOG PERFORMANCE STATUS: 1 - Symptomatic but completely ambulatory  Blood pressure 135/88, pulse 88, temperature 97.9 F (36.6 C), temperature source Oral, resp. rate 18, height 5' (1.524 m), weight 125 lb 14.4 oz (57.108 kg).  LABORATORY DATA: Lab Results  Component Value Date   WBC 7.5 08/02/2013   HGB 13.5 08/02/2013   HCT 41.0 08/02/2013   MCV 79.9 08/02/2013   PLT 266 08/02/2013      Chemistry      Component Value Date/Time   NA 141 08/02/2013 1021   NA 137 07/12/2012 1121   NA 141 01/06/2012 0804   K 4.2 08/02/2013 1021   K 3.9 07/12/2012 1121   K 4.6 01/06/2012 0804   CL 102 04/30/2013 1002   CL 104 07/12/2012 1121   CL 98 01/06/2012 0804   CO2 26 08/02/2013 1021   CO2 24 07/12/2012 1121   CO2 28 01/06/2012 0804   BUN 9.5 08/02/2013 1021   BUN 8 07/12/2012 1121   BUN 10 01/06/2012 0804   CREATININE 0.6 08/02/2013 1021   CREATININE 0.45* 07/12/2012 1121   CREATININE 0.6 01/06/2012 0804      Component Value Date/Time   CALCIUM 9.1 08/02/2013 1021   CALCIUM 8.6 07/12/2012 1121   CALCIUM 8.9 01/06/2012 0804   ALKPHOS 101 08/02/2013 1021   ALKPHOS 93 07/12/2012 1121   ALKPHOS 64 01/06/2012 0804   AST 33 08/02/2013 1021   AST 44* 07/12/2012 1121   AST 27 01/06/2012 0804   ALT 34 08/02/2013 1021   ALT 49* 07/12/2012 1121   ALT 24 01/06/2012 0804   BILITOT 0.67 08/02/2013 1021   BILITOT 0.3 07/12/2012 1121   BILITOT 0.60 01/06/2012 0804       RADIOGRAPHIC STUDIES: Ct Chest W Contrast  08/02/2013   CLINICAL DATA:  Followup non-small cell lung carcinoma. The cough. Undergoing chemotherapy. Previous radiation therapy.  EXAM: CT CHEST, ABDOMEN,  AND PELVIS WITH CONTRAST  TECHNIQUE: Multidetector CT imaging of the chest, abdomen and pelvis was performed following the standard protocol during bolus administration of intravenous contrast.  CONTRAST:  OMNIPAQUE IOHEXOL 300 MG/ML  SOLN  COMPARISON:  02/01/2013  FINDINGS:   CT CHEST FINDINGS  The lobulated nodule in the posterior right upper lobe shows no significant change in size measuring 1.3 x 2.2 cm. Adjacent radiation changes in the paramediastinal lung zone are stable, as well as a small to moderate right pleural effusion. The the no the mediastinal or contralateral hilar lymphadenopathy identified. No evidence of new or enlarging pulmonary nodules or infiltrate. No evidence of left-sided pleural effusion. No evidence of chest wall mass or suspicious bone lesions.    CT ABDOMEN AND PELVIS FINDINGS  The liver, gallbladder, spleen, pancreas,  adrenal glands, and kidneys are normal in appearance. No evidence of hydronephrosis. No abdominal soft tissue masses or lymphadenopathy identified.  A large mass in the right adnexa is again seen displacing the uterus into the left pelvis. This mass has decreased in size since previous study, currently measuring 11.0 x 11.8 cm compared to 11.8 x 13.8 cm previously. This has the appearance of a large subserosal fibroid projecting into the right adnexa.  No other pelvic masses or lymphadenopathy identified. No evidence of inflammatory process or abnormal fluid collections. No evidence of dilated bowel loops or hernia. No suspicious bone lesions identified.  IMPRESSION: CT CHEST IMPRESSION  Stable lobulated nodule in the posterior right upper lobe, with adjacent right lung radiation changes.  Stable small to moderate right pleural effusion.  No new or progressive disease identified in the thorax.  CT ABDOMEN AND PELVIS IMPRESSION  No definite metastatic disease within the abdomen or pelvis.  Mild decrease in size of large right sided uterine fibroid.    Electronically Signed   By: Myles Rosenthal   On: 08/02/2013 11:53   ASSESSMENT AND PLAN: This is a very pleasant 52 years old Asian female with recurrent non-small cell lung cancer, adenocarcinoma. The patient is currently on treatment with oral Tarceva 150 mg by mouth daily and tolerating it fairly well. She has no evidence for disease progression on his recent scan. I discussed the scan results with the patient and her son. I recommended for her to continue treatment with Tarceva with the same dose. For the cough and chest congestion, the patient will continue on cough medication with Tessalon 2 years as well as Hycodan. I also advised her to start taking Allegra over-the-counter 1 tablet by mouth daily. I given a refill of doxycycline 100 mg by mouth twice a day for 7 days. She would come back for followup visit in one month for reevaluation. She was advised to call immediately she has any other concerning symptoms in the interval.  The patient voices understanding of current disease status and treatment options and is in agreement with the current care plan.  All questions were answered. The patient knows to call the clinic with any problems, questions or concerns. We can certainly see the patient much sooner if necessary.  I spent 15 minutes counseling the patient face to face. The total time spent in the appointment was 25 minutes.

## 2013-08-05 NOTE — Patient Instructions (Signed)
  CURRENT THERAPY: Tarceva 150 mg by mouth daily, therapy beginning 07/24/2012. Status post approximately 12 months of therapy.  CHEMOTHERAPY INTENT: Palliative  CURRENT # OF CHEMOTHERAPY CYCLES: 12  CURRENT ANTIEMETICS: Compazine  CURRENT SMOKING STATUS: Nonsmoker  ORAL CHEMOTHERAPY AND CONSENT: Tarceva  CURRENT BISPHOSPHONATES USE: None  PAIN MANAGEMENT: None  NARCOTICS INDUCED CONSTIPATION: None  LIVING WILL AND CODE STATUS: Full code

## 2013-08-07 ENCOUNTER — Encounter: Payer: Self-pay | Admitting: Internal Medicine

## 2013-08-07 NOTE — Progress Notes (Signed)
Called the son Ra to see how many in the household. The dad is the only one working.-- 53family income. Based on paystub-she is overqualified for grants.  I will call the son back to let him know.

## 2013-08-08 ENCOUNTER — Encounter: Payer: Self-pay | Admitting: Internal Medicine

## 2013-08-08 NOTE — Progress Notes (Signed)
Called and spoke with son Ra Sin- based on his dad's income- no assistance is available for mom== overqualified.

## 2013-08-12 ENCOUNTER — Other Ambulatory Visit: Payer: Self-pay

## 2013-09-03 ENCOUNTER — Ambulatory Visit (HOSPITAL_BASED_OUTPATIENT_CLINIC_OR_DEPARTMENT_OTHER): Payer: 59 | Admitting: Physician Assistant

## 2013-09-03 ENCOUNTER — Telehealth: Payer: Self-pay | Admitting: Internal Medicine

## 2013-09-03 ENCOUNTER — Other Ambulatory Visit (HOSPITAL_BASED_OUTPATIENT_CLINIC_OR_DEPARTMENT_OTHER): Payer: 59 | Admitting: Lab

## 2013-09-03 VITALS — BP 108/77 | HR 99 | Temp 98.3°F | Resp 18 | Ht 60.0 in | Wt 122.5 lb

## 2013-09-03 DIAGNOSIS — C3490 Malignant neoplasm of unspecified part of unspecified bronchus or lung: Secondary | ICD-10-CM

## 2013-09-03 DIAGNOSIS — C3430 Malignant neoplasm of lower lobe, unspecified bronchus or lung: Secondary | ICD-10-CM

## 2013-09-03 DIAGNOSIS — R059 Cough, unspecified: Secondary | ICD-10-CM

## 2013-09-03 DIAGNOSIS — R05 Cough: Secondary | ICD-10-CM

## 2013-09-03 LAB — COMPREHENSIVE METABOLIC PANEL (CC13)
ALT: 36 U/L (ref 0–55)
AST: 32 U/L (ref 5–34)
Albumin: 3.8 g/dL (ref 3.5–5.0)
Alkaline Phosphatase: 96 U/L (ref 40–150)
Anion Gap: 9 mEq/L (ref 3–11)
BUN: 9.5 mg/dL (ref 7.0–26.0)
CO2: 29 mEq/L (ref 22–29)
Calcium: 9.8 mg/dL (ref 8.4–10.4)
Chloride: 104 mEq/L (ref 98–109)
Creatinine: 0.6 mg/dL (ref 0.6–1.1)
Glucose: 99 mg/dl (ref 70–140)
Potassium: 4.3 mEq/L (ref 3.5–5.1)
Sodium: 142 mEq/L (ref 136–145)
Total Bilirubin: 0.76 mg/dL (ref 0.20–1.20)
Total Protein: 8 g/dL (ref 6.4–8.3)

## 2013-09-03 LAB — CBC WITH DIFFERENTIAL/PLATELET
BASO%: 0.4 % (ref 0.0–2.0)
Basophils Absolute: 0 10*3/uL (ref 0.0–0.1)
EOS%: 0.7 % (ref 0.0–7.0)
Eosinophils Absolute: 0.1 10*3/uL (ref 0.0–0.5)
HCT: 43.2 % (ref 34.8–46.6)
HGB: 14.2 g/dL (ref 11.6–15.9)
LYMPH%: 20.3 % (ref 14.0–49.7)
MCH: 26.4 pg (ref 25.1–34.0)
MCHC: 32.9 g/dL (ref 31.5–36.0)
MCV: 80.1 fL (ref 79.5–101.0)
MONO#: 0.6 10*3/uL (ref 0.1–0.9)
MONO%: 6.8 % (ref 0.0–14.0)
NEUT#: 6 10*3/uL (ref 1.5–6.5)
NEUT%: 71.8 % (ref 38.4–76.8)
Platelets: 303 10*3/uL (ref 145–400)
RBC: 5.39 10*6/uL (ref 3.70–5.45)
RDW: 17.6 % — ABNORMAL HIGH (ref 11.2–14.5)
WBC: 8.3 10*3/uL (ref 3.9–10.3)
lymph#: 1.7 10*3/uL (ref 0.9–3.3)

## 2013-09-03 MED ORDER — HYDROCOD POLST-CHLORPHEN POLST 10-8 MG/5ML PO LQCR: 5.0000 mL | Freq: Two times a day (BID) | ORAL | Status: DC | PRN

## 2013-09-03 NOTE — Telephone Encounter (Signed)
gv pt/son appt schedule for November.

## 2013-09-03 NOTE — Patient Instructions (Addendum)
Stop all other cough preparations. Take the new cough syrup as prescribed Use your albuterol inhaler as prescribed Continue taking Tarceva 150 mg by mouth daily Follow up in 1 month

## 2013-09-03 NOTE — Progress Notes (Addendum)
[facility] [phone]   [phone]  SHARED VISIT PROGRESS NOTE  Lajuana Matte., MD [address] [place] [address]  PRINCIPAL DIAGNOSIS: Local recurrence of non-small cell lung cancer, adenocarcinoma, initially diagnosed as stage IB (T2a N0 M0) in March of 2010.   PRIOR THERAPY:  1. Status post right lower lobectomy under the care of Dr. Laneta Simmers on 04/08/2009. 2. Status post palliative radiotherapy to the right lower lobe recurrent lung mass under the care of Dr. Mitzi Hansen, completed on June 08, 2011. 3. Systemic chemotherapy with carboplatin for AUC of 5 and Alimta 500 mg/M2 every 3 weeks. She is status post 3 cycles.  CURRENT THERAPY: Tarceva 150 mg by mouth daily, therapy beginning 07/24/2012. Status post approximately 13 months of therapy.  CHEMOTHERAPY INTENT: Palliative  CURRENT # OF CHEMOTHERAPY CYCLES: 13  CURRENT ANTIEMETICS: Compazine  CURRENT SMOKING STATUS: Nonsmoker  ORAL CHEMOTHERAPY AND CONSENT: Tarceva  CURRENT BISPHOSPHONATES USE: None  PAIN MANAGEMENT: None  NARCOTICS INDUCED CONSTIPATION: None  LIVING WILL AND CODE STATUS: Full code   INTERVAL HISTORY: Ilona Tu 52 y.o. female returns to the clinic today for followup visit accompanied by her son who is the interpreter for his mother. The patient is feeling fine today with no specific complaints except for cough. The  Tessalon Perles in addition to Hycodan cough syrup are not controlling her symptoms. She thinks the cough might be a little better but continues to be problematic. The patient is tolerating her treatment with Tarceva fairly well. She denied having any significant skin rash or diarrhea. She has occasional inflammation and sores in the left eyebrow. These inflammatory process in the left eyebrow improved with doxycycline in the past and did calm down quite a bit after taking another short course of the doxycycline. She presents for symptom management  visit related to her treatment with Tarceva.  MEDICAL HISTORY: Past Medical History  Diagnosis Date  . Lung cancer     right lower lobe adenocarcinoma  . History of radiation therapy 05/02/11 to 06/08/11    right lung    ALLERGIES:  is allergic to codeine and doxycycline.  MEDICATIONS:  Current Outpatient Prescriptions  Medication Sig Dispense Refill  . acetaminophen (TYLENOL) 325 MG tablet Take 650 mg by mouth every 6 (six) hours as needed. For pain      . albuterol (PROVENTIL HFA;VENTOLIN HFA) 108 (90 BASE) MCG/ACT inhaler Inhale 2 puffs into the lungs every 6 (six) hours as needed for wheezing.  1 Inhaler  2  . benzonatate (TESSALON) 100 MG capsule Take 2 capsules (200 mg total) by mouth 3 (three) times daily as needed for cough.  30 capsule  1  . chlorpheniramine-HYDROcodone (TUSSIONEX) 10-8 MG/5ML LQCR Take 5 mLs by mouth every 12 (twelve) hours as needed.  240 mL  0  . doxycycline (VIBRA-TABS) 100 MG tablet Take 1 tablet (100 mg total) by mouth 2 (two) times daily.  14 tablet  0  . erlotinib (TARCEVA) 150 MG tablet Take 1 tablet (150 mg total) by mouth daily.  30 tablet  0  . fluticasone (CUTIVATE) 0.05 % cream       . HYDROcodone-homatropine (HYCODAN) 5-1.5 MG/5ML syrup Take 5 mLs by mouth every 6 (six) hours as needed for cough.  120 mL  0  . ibuprofen (ADVIL,MOTRIN) 100 MG tablet Take 200 mg by mouth every 6 (six) hours as needed. For pain      . lidocaine-prilocaine (EMLA) cream Apply to  Port-A-Cath prior to tx PRN  5 g  prn 1 year   No current facility-administered medications for this visit.    SURGICAL HISTORY:  Past Surgical History  Procedure Laterality Date  . Lung lobectomy  04/18/2009    RLL  . Portacath placement  02/29/2012    Procedure: INSERTION PORT-A-CATH;  Surgeon: Ines Bloomer, MD;  Location: [facility] OR;  Service: Thoracic;  Laterality: Left;  PowerPort 8 F attachable in left internal jugular.    REVIEW OF SYSTEMS:  A comprehensive review of systems was  negative except for: Respiratory: positive for cough   PHYSICAL EXAMINATION: General appearance: alert, cooperative and no distress Head: Normocephalic, without obvious abnormality, atraumatic Neck: no adenopathy Lymph nodes: Cervical, supraclavicular, and axillary nodes normal. Resp: wheezes bilaterally and Right side greater than the left side Cardio: regular rate and rhythm, S1, S2 normal, no murmur, click, rub or gallop GI: soft, non-tender; bowel sounds normal; no masses,  no organomegaly Extremities: extremities normal, atraumatic, no cyanosis or edema Neurologic: Alert and oriented X 3, normal strength and tone. Normal symmetric reflexes. Normal coordination and gait  ECOG PERFORMANCE STATUS: 1 - Symptomatic but completely ambulatory  Blood pressure 108/77, pulse 99, temperature 98.3 F (36.8 C), temperature source Oral, resp. rate 18, height 5' (1.524 m), weight 122 lb 8 oz (55.566 kg).  LABORATORY DATA: Lab Results  Component Value Date   WBC 8.3 09/03/2013   HGB 14.2 09/03/2013   HCT 43.2 09/03/2013   MCV 80.1 09/03/2013   PLT 303 09/03/2013      Chemistry      Component Value Date/Time   NA 142 09/03/2013 1041   NA 137 07/12/2012 1121   NA 141 01/06/2012 0804   K 4.3 09/03/2013 1041   K 3.9 07/12/2012 1121   K 4.6 01/06/2012 0804   CL 102 04/30/2013 1002   CL 104 07/12/2012 1121   CL 98 01/06/2012 0804   CO2 29 09/03/2013 1041   CO2 24 07/12/2012 1121   CO2 28 01/06/2012 0804   BUN 9.5 09/03/2013 1041   BUN 8 07/12/2012 1121   BUN 10 01/06/2012 0804   CREATININE 0.6 09/03/2013 1041   CREATININE 0.45* 07/12/2012 1121   CREATININE 0.6 01/06/2012 0804      Component Value Date/Time   CALCIUM 9.8 09/03/2013 1041   CALCIUM 8.6 07/12/2012 1121   CALCIUM 8.9 01/06/2012 0804   ALKPHOS 96 09/03/2013 1041   ALKPHOS 93 07/12/2012 1121   ALKPHOS 64 01/06/2012 0804   AST 32 09/03/2013 1041   AST 44* 07/12/2012 1121   AST 27 01/06/2012 0804   ALT 36 09/03/2013 1041   ALT 49* 07/12/2012 1121   ALT 24  01/06/2012 0804   BILITOT 0.76 09/03/2013 1041   BILITOT 0.3 07/12/2012 1121   BILITOT 0.60 01/06/2012 0804       RADIOGRAPHIC STUDIES: Ct Chest W Contrast  08/02/2013   CLINICAL DATA:  Followup non-small cell lung carcinoma. The cough. Undergoing chemotherapy. Previous radiation therapy.  EXAM: CT CHEST, ABDOMEN, AND PELVIS WITH CONTRAST  TECHNIQUE: Multidetector CT imaging of the chest, abdomen and pelvis was performed following the standard protocol during bolus administration of intravenous contrast.  CONTRAST:  OMNIPAQUE IOHEXOL 300 MG/ML  SOLN  COMPARISON:  02/01/2013  FINDINGS:   CT CHEST FINDINGS  The lobulated nodule in the posterior right upper lobe shows no significant change in size measuring 1.3 x 2.2 cm. Adjacent radiation changes in the paramediastinal lung zone are stable, as  well as a small to moderate right pleural effusion. The the no the mediastinal or contralateral hilar lymphadenopathy identified. No evidence of new or enlarging pulmonary nodules or infiltrate. No evidence of left-sided pleural effusion. No evidence of chest wall mass or suspicious bone lesions.    CT ABDOMEN AND PELVIS FINDINGS  The liver, gallbladder, spleen, pancreas, adrenal glands, and kidneys are normal in appearance. No evidence of hydronephrosis. No abdominal soft tissue masses or lymphadenopathy identified.  A large mass in the right adnexa is again seen displacing the uterus into the left pelvis. This mass has decreased in size since previous study, currently measuring 11.0 x 11.8 cm compared to 11.8 x 13.8 cm previously. This has the appearance of a large subserosal fibroid projecting into the right adnexa.  No other pelvic masses or lymphadenopathy identified. No evidence of inflammatory process or abnormal fluid collections. No evidence of dilated bowel loops or hernia. No suspicious bone lesions identified.  IMPRESSION: CT CHEST IMPRESSION  Stable lobulated nodule in the posterior right upper lobe,  with adjacent right lung radiation changes.  Stable small to moderate right pleural effusion.  No new or progressive disease identified in the thorax.  CT ABDOMEN AND PELVIS IMPRESSION  No definite metastatic disease within the abdomen or pelvis.  Mild decrease in size of large right sided uterine fibroid.   Electronically Signed   By: Myles Rosenthal   On: 08/02/2013 11:53   ASSESSMENT AND PLAN: This is a very pleasant 52 years old Asian female with recurrent non-small cell lung cancer, adenocarcinoma. The patient is currently on treatment with oral Tarceva 150 mg by mouth daily and tolerating it fairly well. She has no evidence for disease progression on his recent scan. Patient was discussed with also seen by Dr. Arbutus Ped. She will continue on her Tarceva at 150 mg by mouth daily. We'll discontinue her Hycodan and [employer]. She is given a prescription for Tussionex cough syrup, 5 ML's by mouth every 12 hours as needed for cough. Further review of her medications also reveal that she RD has an albuterol inhaler and she was encouraged to use the inhaler as frequently as prescribed. Patient will return in one month for another symptom management visit with repeat CBC differential and C. Met.  Laural Benes, Xanthe Couillard E, PA-C   She was advised to call immediately she has any other concerning symptoms in the interval.  The patient voices understanding of current disease status and treatment options and is in agreement with the current care plan.  All questions were answered. The patient knows to call the clinic with any problems, questions or concerns. We can certainly see the patient much sooner if necessary.  I spent 15 minutes counseling the patient face to face. The total time spent in the appointment was 25 minutes.    ADDENDUM: Hematology/Oncology Attending: I had a face to face encounter with the patient. I recommended her care plan. This is a very pleasant 52 years old Asian female with recurrent  non-small cell lung cancer, adenocarcinoma. She is currently on treatment with oral Tarceva 150 mg by mouth daily status post 13 months of treatment. She is tolerating her treatment fairly well except for some inflammation and sores in the eyebrow left more than right. The sores improved after the patient was treated with a course of doxycycline. She denied having any other significant rash or diarrhea. The patient continues to have dry cough and she is currently on Hycodan and Tessalon. She also has albuterol  inhaler but she does not use it at regular basis.She is requesting a different cough suppressant. We will start the patient on Fosamax 5 ML by mouth every 12 hours as needed. She would come back for follow up visit in one month's with repeat CBC and comprehensive metabolic panel. She was advised to call immediately if she has any concerning symptoms in the interval. Lajuana Matte., MD 09/03/2013

## 2013-09-30 ENCOUNTER — Ambulatory Visit (HOSPITAL_BASED_OUTPATIENT_CLINIC_OR_DEPARTMENT_OTHER): Payer: 59 | Admitting: Internal Medicine

## 2013-09-30 ENCOUNTER — Encounter (INDEPENDENT_AMBULATORY_CARE_PROVIDER_SITE_OTHER): Payer: Self-pay

## 2013-09-30 ENCOUNTER — Other Ambulatory Visit (HOSPITAL_BASED_OUTPATIENT_CLINIC_OR_DEPARTMENT_OTHER): Payer: 59 | Admitting: Lab

## 2013-09-30 ENCOUNTER — Encounter: Payer: Self-pay | Admitting: Internal Medicine

## 2013-09-30 ENCOUNTER — Telehealth: Payer: Self-pay | Admitting: Internal Medicine

## 2013-09-30 VITALS — BP 123/78 | HR 89 | Temp 98.1°F | Resp 18 | Ht 60.0 in | Wt 126.6 lb

## 2013-09-30 DIAGNOSIS — R197 Diarrhea, unspecified: Secondary | ICD-10-CM

## 2013-09-30 DIAGNOSIS — C3490 Malignant neoplasm of unspecified part of unspecified bronchus or lung: Secondary | ICD-10-CM

## 2013-09-30 DIAGNOSIS — C3430 Malignant neoplasm of lower lobe, unspecified bronchus or lung: Secondary | ICD-10-CM

## 2013-09-30 DIAGNOSIS — R062 Wheezing: Secondary | ICD-10-CM

## 2013-09-30 LAB — COMPREHENSIVE METABOLIC PANEL (CC13)
ALT: 32 U/L (ref 0–55)
AST: 26 U/L (ref 5–34)
Albumin: 3.4 g/dL — ABNORMAL LOW (ref 3.5–5.0)
Alkaline Phosphatase: 84 U/L (ref 40–150)
Anion Gap: 9 mEq/L (ref 3–11)
BUN: 10.3 mg/dL (ref 7.0–26.0)
CO2: 25 mEq/L (ref 22–29)
Calcium: 9.2 mg/dL (ref 8.4–10.4)
Chloride: 106 mEq/L (ref 98–109)
Creatinine: 0.5 mg/dL — ABNORMAL LOW (ref 0.6–1.1)
Glucose: 92 mg/dl (ref 70–140)
Potassium: 4 mEq/L (ref 3.5–5.1)
Sodium: 141 mEq/L (ref 136–145)
Total Bilirubin: 0.65 mg/dL (ref 0.20–1.20)
Total Protein: 6.9 g/dL (ref 6.4–8.3)

## 2013-09-30 LAB — CBC WITH DIFFERENTIAL/PLATELET
BASO%: 0.6 % (ref 0.0–2.0)
Basophils Absolute: 0 10*3/uL (ref 0.0–0.1)
EOS%: 0.6 % (ref 0.0–7.0)
Eosinophils Absolute: 0.1 10*3/uL (ref 0.0–0.5)
HCT: 37.8 % (ref 34.8–46.6)
HGB: 12.4 g/dL (ref 11.6–15.9)
LYMPH%: 22.4 % (ref 14.0–49.7)
MCH: 27.1 pg (ref 25.1–34.0)
MCHC: 32.7 g/dL (ref 31.5–36.0)
MCV: 83 fL (ref 79.5–101.0)
MONO#: 0.6 10*3/uL (ref 0.1–0.9)
MONO%: 7.4 % (ref 0.0–14.0)
NEUT#: 5.7 10*3/uL (ref 1.5–6.5)
NEUT%: 69 % (ref 38.4–76.8)
Platelets: 252 10*3/uL (ref 145–400)
RBC: 4.56 10*6/uL (ref 3.70–5.45)
RDW: 16.3 % — ABNORMAL HIGH (ref 11.2–14.5)
WBC: 8.2 10*3/uL (ref 3.9–10.3)
lymph#: 1.8 10*3/uL (ref 0.9–3.3)

## 2013-09-30 NOTE — Telephone Encounter (Signed)
gv and printed papt sched and avs for pt for DEC

## 2013-09-30 NOTE — Progress Notes (Signed)
[facility] [phone]   Fax:([phone]  OFFICE PROGRESS NOTE  Lajuana Matte., MD [address] [place] [address]  PRINCIPAL DIAGNOSIS: Local recurrence of non-small cell lung cancer, adenocarcinoma, initially diagnosed as stage IB (T2a N0 M0) in March of 2010.   PRIOR THERAPY:  1. Status post right lower lobectomy under the care of Dr. Laneta Simmers on 04/08/2009. 2. Status post palliative radiotherapy to the right lower lobe recurrent lung mass under the care of Dr. Mitzi Hansen, completed on June 08, 2011. 3. Systemic chemotherapy with carboplatin for AUC of 5 and Alimta 500 mg/M2 every 3 weeks. She is status post 3 cycles.  CURRENT THERAPY: Tarceva 150 mg by mouth daily, therapy beginning 07/24/2012. Status post approximately 14 months of therapy.   CHEMOTHERAPY INTENT: Palliative  CURRENT # OF CHEMOTHERAPY CYCLES: 14  CURRENT ANTIEMETICS: Compazine  CURRENT SMOKING STATUS: Nonsmoker  ORAL CHEMOTHERAPY AND CONSENT: Tarceva  CURRENT BISPHOSPHONATES USE: None  PAIN MANAGEMENT: None  NARCOTICS INDUCED CONSTIPATION: None  LIVING WILL AND CODE STATUS: Full code  INTERVAL HISTORY: [name] 52 y.o. female returns to the clinic today for  Follow up visit accompanied by her son. The patient is feeling much better today with no specific complaints except for occasional diarrhea. Her skin rash has significantly improved. The patient also has improvement in her cough. She did not have to use Tussionex for cough but she continues to have occasional wheezes and she is currently on albuterol inhaler. The patient denied having any significant weight loss or night sweats. She denied having any nausea or vomiting , fever or chills. She has no chest pain, shortness of breath or hemoptysis.  MEDICAL HISTORY: Past Medical History  Diagnosis Date  . Lung cancer     right lower lobe adenocarcinoma  . History of radiation therapy 05/02/11 to 06/08/11    right lung     ALLERGIES:  is allergic to codeine and doxycycline.  MEDICATIONS:  Current Outpatient Prescriptions  Medication Sig Dispense Refill  . acetaminophen (TYLENOL) 325 MG tablet Take 650 mg by mouth every 6 (six) hours as needed. For pain      . albuterol (PROVENTIL HFA;VENTOLIN HFA) 108 (90 BASE) MCG/ACT inhaler Inhale 2 puffs into the lungs every 6 (six) hours as needed for wheezing.  1 Inhaler  2  . benzonatate (TESSALON) 100 MG capsule Take 2 capsules (200 mg total) by mouth 3 (three) times daily as needed for cough.  30 capsule  1  . chlorpheniramine-HYDROcodone (TUSSIONEX) 10-8 MG/5ML LQCR Take 5 mLs by mouth every 12 (twelve) hours as needed.  240 mL  0  . doxycycline (VIBRA-TABS) 100 MG tablet Take 1 tablet (100 mg total) by mouth 2 (two) times daily.  14 tablet  0  . erlotinib (TARCEVA) 150 MG tablet Take 1 tablet (150 mg total) by mouth daily.  30 tablet  0  . fluticasone (CUTIVATE) 0.05 % cream       . HYDROcodone-homatropine (HYCODAN) 5-1.5 MG/5ML syrup Take 5 mLs by mouth every 6 (six) hours as needed for cough.  120 mL  0  . ibuprofen (ADVIL,MOTRIN) 100 MG tablet Take 200 mg by mouth every 6 (six) hours as needed. For pain      . lidocaine-prilocaine (EMLA) cream Apply to Port-A-Cath prior to tx PRN  5 g  prn 1 year   No current facility-administered medications for this visit.    SURGICAL HISTORY:  Past Surgical History  Procedure Laterality  Date  . Lung lobectomy  04/18/2009    RLL  . Portacath placement  02/29/2012    Procedure: INSERTION PORT-A-CATH;  Surgeon: Ines Bloomer, MD;  Location: [facility] OR;  Service: Thoracic;  Laterality: Left;  PowerPort 8 F attachable in left internal jugular.    REVIEW OF SYSTEMS:  A comprehensive review of systems was negative except for: Respiratory: positive for wheezing   PHYSICAL EXAMINATION: General appearance: alert, cooperative and no distress Head: Normocephalic, without obvious abnormality, atraumatic Neck: no adenopathy, no JVD,  supple, symmetrical, trachea midline and thyroid not enlarged, symmetric, no tenderness/mass/nodules Lymph nodes: Cervical, supraclavicular, and axillary nodes normal. Resp: wheezes bilaterally Back: symmetric, no curvature. ROM normal. No CVA tenderness. Cardio: regular rate and rhythm, S1, S2 normal, no murmur, click, rub or gallop GI: soft, non-tender; bowel sounds normal; no masses,  no organomegaly Extremities: extremities normal, atraumatic, no cyanosis or edema  ECOG PERFORMANCE STATUS: 1 - Symptomatic but completely ambulatory  Blood pressure 123/78, pulse 89, temperature 98.1 F (36.7 C), temperature source Oral, resp. rate 18, height 5' (1.524 m), weight 126 lb 9.6 oz (57.425 kg), SpO2 100.00%.  LABORATORY DATA: Lab Results  Component Value Date   WBC 8.2 09/30/2013   HGB 12.4 09/30/2013   HCT 37.8 09/30/2013   MCV 83.0 09/30/2013   PLT 252 09/30/2013      Chemistry      Component Value Date/Time   NA 142 09/03/2013 1041   NA 137 07/12/2012 1121   NA 141 01/06/2012 0804   K 4.3 09/03/2013 1041   K 3.9 07/12/2012 1121   K 4.6 01/06/2012 0804   CL 102 04/30/2013 1002   CL 104 07/12/2012 1121   CL 98 01/06/2012 0804   CO2 29 09/03/2013 1041   CO2 24 07/12/2012 1121   CO2 28 01/06/2012 0804   BUN 9.5 09/03/2013 1041   BUN 8 07/12/2012 1121   BUN 10 01/06/2012 0804   CREATININE 0.6 09/03/2013 1041   CREATININE 0.45* 07/12/2012 1121   CREATININE 0.6 01/06/2012 0804      Component Value Date/Time   CALCIUM 9.8 09/03/2013 1041   CALCIUM 8.6 07/12/2012 1121   CALCIUM 8.9 01/06/2012 0804   ALKPHOS 96 09/03/2013 1041   ALKPHOS 93 07/12/2012 1121   ALKPHOS 64 01/06/2012 0804   AST 32 09/03/2013 1041   AST 44* 07/12/2012 1121   AST 27 01/06/2012 0804   ALT 36 09/03/2013 1041   ALT 49* 07/12/2012 1121   ALT 24 01/06/2012 0804   BILITOT 0.76 09/03/2013 1041   BILITOT 0.3 07/12/2012 1121   BILITOT 0.60 01/06/2012 0804       RADIOGRAPHIC STUDIES: No results found.  ASSESSMENT AND PLAN: This is a very  pleasant 52 years old Asian female with recurrent non-small cell lung cancer, adenocarcinoma. She is currently on treatment with oral Tarceva 150 mg by mouth daily status post 14 months of treatment. She is tolerating her treatment with Tarceva fairly well. I recommended for the patient to continue with the current treatment. The patient continues to have mild cough but significantly improved compared to the last visit.  She also has albuterol inhaler for the wheezes but she does not use it at regular basis. She would come back for follow up visit in one month with repeat CBC and comprehensive metabolic panel.  She was advised to call immediately if she has any concerning symptoms in the interval.  The patient voices understanding of current disease status and treatment options  and is in agreement with the current care plan.  All questions were answered. The patient knows to call the clinic with any problems, questions or concerns. We can certainly see the patient much sooner if necessary.

## 2013-09-30 NOTE — Patient Instructions (Signed)
CURRENT THERAPY: Tarceva 150 mg by mouth daily, therapy beginning 07/24/2012. Status post approximately 14 months of therapy.  CHEMOTHERAPY INTENT: Palliative  CURRENT # OF CHEMOTHERAPY CYCLES: 14  CURRENT ANTIEMETICS: Compazine  CURRENT SMOKING STATUS: Nonsmoker  ORAL CHEMOTHERAPY AND CONSENT: Tarceva  CURRENT BISPHOSPHONATES USE: None  PAIN MANAGEMENT: None  NARCOTICS INDUCED CONSTIPATION: None  LIVING WILL AND CODE STATUS: Full code

## 2013-10-28 ENCOUNTER — Ambulatory Visit: Payer: 59

## 2013-10-28 ENCOUNTER — Other Ambulatory Visit (HOSPITAL_BASED_OUTPATIENT_CLINIC_OR_DEPARTMENT_OTHER): Payer: 59 | Admitting: Lab

## 2013-10-28 ENCOUNTER — Encounter: Payer: Self-pay | Admitting: Physician Assistant

## 2013-10-28 ENCOUNTER — Ambulatory Visit (HOSPITAL_BASED_OUTPATIENT_CLINIC_OR_DEPARTMENT_OTHER): Payer: 59 | Admitting: Physician Assistant

## 2013-10-28 ENCOUNTER — Telehealth: Payer: Self-pay | Admitting: Internal Medicine

## 2013-10-28 VITALS — BP 107/73 | HR 98 | Temp 98.5°F | Resp 18 | Ht 60.0 in | Wt 128.8 lb

## 2013-10-28 DIAGNOSIS — C3430 Malignant neoplasm of lower lobe, unspecified bronchus or lung: Secondary | ICD-10-CM

## 2013-10-28 DIAGNOSIS — C3490 Malignant neoplasm of unspecified part of unspecified bronchus or lung: Secondary | ICD-10-CM

## 2013-10-28 DIAGNOSIS — R21 Rash and other nonspecific skin eruption: Secondary | ICD-10-CM

## 2013-10-28 LAB — COMPREHENSIVE METABOLIC PANEL (CC13)
ALT: 17 U/L (ref 0–55)
AST: 23 U/L (ref 5–34)
Albumin: 3.5 g/dL (ref 3.5–5.0)
Alkaline Phosphatase: 79 U/L (ref 40–150)
Anion Gap: 8 mEq/L (ref 3–11)
BUN: 12.5 mg/dL (ref 7.0–26.0)
CO2: 26 mEq/L (ref 22–29)
Calcium: 8.7 mg/dL (ref 8.4–10.4)
Chloride: 105 mEq/L (ref 98–109)
Creatinine: 0.6 mg/dL (ref 0.6–1.1)
Glucose: 91 mg/dl (ref 70–140)
Potassium: 3.9 mEq/L (ref 3.5–5.1)
Sodium: 139 mEq/L (ref 136–145)
Total Bilirubin: 0.91 mg/dL (ref 0.20–1.20)
Total Protein: 6.8 g/dL (ref 6.4–8.3)

## 2013-10-28 LAB — CBC WITH DIFFERENTIAL/PLATELET
BASO%: 0.4 % (ref 0.0–2.0)
Basophils Absolute: 0 10*3/uL (ref 0.0–0.1)
EOS%: 1.5 % (ref 0.0–7.0)
Eosinophils Absolute: 0.2 10*3/uL (ref 0.0–0.5)
HCT: 37.8 % (ref 34.8–46.6)
HGB: 12.1 g/dL (ref 11.6–15.9)
LYMPH%: 16.6 % (ref 14.0–49.7)
MCH: 27.4 pg (ref 25.1–34.0)
MCHC: 32 g/dL (ref 31.5–36.0)
MCV: 85.6 fL (ref 79.5–101.0)
MONO#: 0.7 10*3/uL (ref 0.1–0.9)
MONO%: 6 % (ref 0.0–14.0)
NEUT#: 8.5 10*3/uL — ABNORMAL HIGH (ref 1.5–6.5)
NEUT%: 75.5 % (ref 38.4–76.8)
Platelets: 267 10*3/uL (ref 145–400)
RBC: 4.42 10*6/uL (ref 3.70–5.45)
RDW: 16 % — ABNORMAL HIGH (ref 11.2–14.5)
WBC: 11.3 10*3/uL — ABNORMAL HIGH (ref 3.9–10.3)
lymph#: 1.9 10*3/uL (ref 0.9–3.3)

## 2013-10-28 MED ORDER — SODIUM CHLORIDE 0.9 % IJ SOLN
10.0000 mL | Freq: Once | INTRAMUSCULAR | Status: AC
  Administered 2013-10-28: 10 mL
  Filled 2013-10-28: qty 10

## 2013-10-28 MED ORDER — HEPARIN SOD (PORK) LOCK FLUSH 100 UNIT/ML IV SOLN
500.0000 [IU] | Freq: Once | INTRAVENOUS | Status: AC
  Administered 2013-10-28: 500 [IU] via INTRAVENOUS
  Filled 2013-10-28: qty 5

## 2013-10-28 MED ORDER — CLINDAMYCIN PHOSPHATE 1 % EX LOTN: TOPICAL_LOTION | Freq: Two times a day (BID) | CUTANEOUS | Status: DC

## 2013-10-28 NOTE — Progress Notes (Addendum)
[facility] Telephone:(336) [phone]   [phone]  SHARED VISIT PROGRESS NOTE  Lajuana Matte., MD [address] [place] [address]  PRINCIPAL DIAGNOSIS: Local recurrence of non-small cell lung cancer, adenocarcinoma, initially diagnosed as stage IB (T2a N0 M0) in March of 2010.   PRIOR THERAPY:  1. Status post right lower lobectomy under the care of Dr. Laneta Simmers on 04/08/2009. 2. Status post palliative radiotherapy to the right lower lobe recurrent lung mass under the care of Dr. Mitzi Hansen, completed on June 08, 2011. 3. Systemic chemotherapy with carboplatin for AUC of 5 and Alimta 500 mg/M2 every 3 weeks. She is status post 3 cycles.  CURRENT THERAPY: Tarceva 150 mg by mouth daily, therapy beginning 07/24/2012. Status post approximately 15 months of therapy.   CHEMOTHERAPY INTENT: Palliative  CURRENT # OF CHEMOTHERAPY CYCLES: 15  CURRENT ANTIEMETICS: Compazine  CURRENT SMOKING STATUS: Nonsmoker  ORAL CHEMOTHERAPY AND CONSENT: Tarceva  CURRENT BISPHOSPHONATES USE: None  PAIN MANAGEMENT: None  NARCOTICS INDUCED CONSTIPATION: None  LIVING WILL AND CODE STATUS: Full code  INTERVAL HISTORY: [name] 52 y.o. female returns to the clinic today for a follow up visit accompanied by her son. Overall she feels well. She is planning a visit to [place] January 8 through 12/10/2013. She requests a refill for the antibiotic that she takes for the grade 2 rash related to the Tarceva. This tends to be worse around her eye brow line. Further questioning reveals the patient only takes 1 dose of the antibiotic once or twice a week. She reports that her episodes of diarrhea are stable and are not problematic. She voiced no other complaints. Her Port-A-Cath was last flushed 07/08/2013. The patient is feeling much better today with no specific complaints except for occasional diarrhea. Her skin rash has significantly improved. The patient also has improvement in her cough. She  did not have to use Tussionex for cough but she continues to have occasional wheezes and she is currently on albuterol inhaler. The patient denied having any significant weight loss or night sweats. She denied having any nausea or vomiting , fever or chills. She has no chest pain, shortness of breath or hemoptysis.  MEDICAL HISTORY: Past Medical History  Diagnosis Date  . Lung cancer     right lower lobe adenocarcinoma  . History of radiation therapy 05/02/11 to 06/08/11    right lung    ALLERGIES:  is allergic to codeine and doxycycline.  MEDICATIONS:  Current Outpatient Prescriptions  Medication Sig Dispense Refill  . acetaminophen (TYLENOL) 325 MG tablet Take 650 mg by mouth every 6 (six) hours as needed. For pain      . albuterol (PROVENTIL HFA;VENTOLIN HFA) 108 (90 BASE) MCG/ACT inhaler Inhale 2 puffs into the lungs every 6 (six) hours as needed for wheezing.  1 Inhaler  2  . benzonatate (TESSALON) 100 MG capsule Take 2 capsules (200 mg total) by mouth 3 (three) times daily as needed for cough.  30 capsule  1  . chlorpheniramine-HYDROcodone (TUSSIONEX) 10-8 MG/5ML LQCR Take 5 mLs by mouth every 12 (twelve) hours as needed.  240 mL  0  . erlotinib (TARCEVA) 150 MG tablet Take 1 tablet (150 mg total) by mouth daily.  30 tablet  0  . fluticasone (CUTIVATE) 0.05 % cream       . ibuprofen (ADVIL,MOTRIN) 100 MG tablet Take 200 mg by mouth every 6 (six) hours as needed. For pain      . clindamycin (  CLEOCIN T) 1 % lotion Apply topically 2 (two) times daily.  60 mL  1  . doxycycline (VIBRA-TABS) 100 MG tablet Take 1 tablet (100 mg total) by mouth 2 (two) times daily.  14 tablet  0  . HYDROcodone-homatropine (HYCODAN) 5-1.5 MG/5ML syrup Take 5 mLs by mouth every 6 (six) hours as needed for cough.  120 mL  0  . lidocaine-prilocaine (EMLA) cream Apply to Port-A-Cath prior to tx PRN  5 g  prn 1 year   No current facility-administered medications for this visit.    SURGICAL HISTORY:  Past  Surgical History  Procedure Laterality Date  . Lung lobectomy  04/18/2009    RLL  . Portacath placement  02/29/2012    Procedure: INSERTION PORT-A-CATH;  Surgeon: Ines Bloomer, MD;  Location: [facility] OR;  Service: Thoracic;  Laterality: Left;  PowerPort 8 F attachable in left internal jugular.    REVIEW OF SYSTEMS:  A comprehensive review of systems was negative except for: Gastrointestinal: positive for diarrhea Scant grade 1 skin rash related to Tarceva therapy affecting the face   PHYSICAL EXAMINATION: General appearance: alert, cooperative and no distress Head: Normocephalic, without obvious abnormality, atraumatic Neck: no adenopathy, no JVD, supple, symmetrical, trachea midline and thyroid not enlarged, symmetric, no tenderness/mass/nodules Lymph nodes: Cervical, supraclavicular, and axillary nodes normal. Resp: wheezes bilaterally Back: symmetric, no curvature. ROM normal. No CVA tenderness. Cardio: regular rate and rhythm, S1, S2 normal, no murmur, click, rub or gallop GI: soft, non-tender; bowel sounds normal; no masses,  no organomegaly Extremities: extremities normal, atraumatic, no cyanosis or edema Skin: Faint small areas of erythematous acneiform eruptions on the eye brow line in on the cheeks, no evidence of superinfection  ECOG PERFORMANCE STATUS: 1 - Symptomatic but completely ambulatory  Blood pressure 107/73, pulse 98, temperature 98.5 F (36.9 C), resp. rate 18, height 5' (1.524 m), weight 128 lb 12.8 oz (58.423 kg), SpO2 98.00%.  LABORATORY DATA: Lab Results  Component Value Date   WBC 11.3* 10/28/2013   HGB 12.1 10/28/2013   HCT 37.8 10/28/2013   MCV 85.6 10/28/2013   PLT 267 10/28/2013      Chemistry      Component Value Date/Time   NA 139 10/28/2013 0922   NA 137 07/12/2012 1121   NA 141 01/06/2012 0804   K 3.9 10/28/2013 0922   K 3.9 07/12/2012 1121   K 4.6 01/06/2012 0804   CL 102 04/30/2013 1002   CL 104 07/12/2012 1121   CL 98 01/06/2012 0804   CO2 26 10/28/2013  0922   CO2 24 07/12/2012 1121   CO2 28 01/06/2012 0804   BUN 12.5 10/28/2013 0922   BUN 8 07/12/2012 1121   BUN 10 01/06/2012 0804   CREATININE 0.6 10/28/2013 0922   CREATININE 0.45* 07/12/2012 1121   CREATININE 0.6 01/06/2012 0804      Component Value Date/Time   CALCIUM 8.7 10/28/2013 0922   CALCIUM 8.6 07/12/2012 1121   CALCIUM 8.9 01/06/2012 0804   ALKPHOS 79 10/28/2013 0922   ALKPHOS 93 07/12/2012 1121   ALKPHOS 64 01/06/2012 0804   AST 23 10/28/2013 0922   AST 44* 07/12/2012 1121   AST 27 01/06/2012 0804   ALT 17 10/28/2013 0922   ALT 49* 07/12/2012 1121   ALT 24 01/06/2012 0804   BILITOT 0.91 10/28/2013 0922   BILITOT 0.3 07/12/2012 1121   BILITOT 0.60 01/06/2012 0804       RADIOGRAPHIC STUDIES: No results found.  ASSESSMENT AND PLAN:  This is a very pleasant 52 years old Asian female with recurrent non-small cell lung cancer, adenocarcinoma. She is currently on treatment with oral Tarceva 150 mg by mouth daily status post 15 months of treatment. She is tolerating her treatment with Tarceva fairly well. An appropriate use of antibiotic therapy for her rash was discussed with the patient. For the mild skin eruptions that she is now patient will be placed on clindamycin lotion to be used twice daily as needed. A prescription for clindamycin lotion was sent to her pharmacy of record via E. scribed. It is been over 3 months since her Port-A-Cath was flushed and we will arrange to have the Port-A-Cath flush today. Patient encouraged to keep the every eight-week Port-A-Cath flush appointments for patency. She will followup with Dr. Arbutus Ped in 4 weeks with restaging CT scan of her chest, abdomen and pelvis with contrast to reevaluate her disease. She will continue on Tarceva at 150 mg by mouth daily.   Laural Benes, Rhona Fusilier E, PA-C   She was advised to call immediately if she has any concerning symptoms in the interval.  The patient voices understanding of current disease status and treatment options and is in  agreement with the current care plan.  All questions were answered. The patient knows to call the clinic with any problems, questions or concerns. We can certainly see the patient much sooner if necessary.  ADDENDUM: Hematology/oncology Attending: I had the face to face encounter with the patient. I recommended her care plan. She is a very pleasant 52 years old Asian female with recurrent non-small cell lung cancer, adenocarcinoma who is currently on treatment with Tarceva150 mg by mouth daily for the last 15 months and tolerating her treatment fairly well except for occasional skin rash mainly on the eyebrows. I recommended for her to apply clindamycin lotion to the lesions in the eyebrows. The patient would come back for follow up visit in one month with repeat CT scan of the chest, abdomen and pelvis for reevaluation of her disease. She was advised to call immediately if she has any concerning symptoms in the interval. Lajuana Matte., MD. 10/29/2013

## 2013-10-28 NOTE — Telephone Encounter (Signed)
Gave pt appt for lab and Md for December and January 2015

## 2013-10-29 NOTE — Patient Instructions (Signed)
Continue taking Tarceva 150 mg by mouth daily Followup with Dr. Arbutus Ped in 4 weeks with restaging CT scan of the chest, abdomen and pelvis to reevaluate your disease Your Port-A-Cath will be flushed today, and should be flushed approximately every 8 weeks.

## 2013-11-22 ENCOUNTER — Encounter (HOSPITAL_COMMUNITY): Payer: Self-pay

## 2013-11-22 ENCOUNTER — Ambulatory Visit (HOSPITAL_COMMUNITY)
Admission: RE | Admit: 2013-11-22 | Discharge: 2013-11-22 | Disposition: A | Discharge status: Home / Self Care | Payer: 59 | Source: Ambulatory Visit | Attending: Physician Assistant | Admitting: Physician Assistant

## 2013-11-22 DIAGNOSIS — R911 Solitary pulmonary nodule: Secondary | ICD-10-CM | POA: Insufficient documentation

## 2013-11-22 DIAGNOSIS — M47817 Spondylosis without myelopathy or radiculopathy, lumbosacral region: Secondary | ICD-10-CM | POA: Insufficient documentation

## 2013-11-22 DIAGNOSIS — N9489 Other specified conditions associated with female genital organs and menstrual cycle: Secondary | ICD-10-CM | POA: Insufficient documentation

## 2013-11-22 DIAGNOSIS — Z902 Acquired absence of lung [part of]: Secondary | ICD-10-CM | POA: Insufficient documentation

## 2013-11-22 DIAGNOSIS — C3490 Malignant neoplasm of unspecified part of unspecified bronchus or lung: Secondary | ICD-10-CM

## 2013-11-22 DIAGNOSIS — J90 Pleural effusion, not elsewhere classified: Secondary | ICD-10-CM | POA: Insufficient documentation

## 2013-11-22 MED ORDER — IOHEXOL 300 MG/ML  SOLN
100.0000 mL | Freq: Once | INTRAMUSCULAR | Status: AC | PRN
  Administered 2013-11-22: 80 mL via INTRAVENOUS

## 2013-11-25 ENCOUNTER — Telehealth: Payer: Self-pay | Admitting: Internal Medicine

## 2013-11-25 ENCOUNTER — Other Ambulatory Visit (HOSPITAL_BASED_OUTPATIENT_CLINIC_OR_DEPARTMENT_OTHER): Payer: 59

## 2013-11-25 ENCOUNTER — Encounter: Payer: Self-pay | Admitting: Internal Medicine

## 2013-11-25 ENCOUNTER — Ambulatory Visit (HOSPITAL_BASED_OUTPATIENT_CLINIC_OR_DEPARTMENT_OTHER): Payer: 59 | Admitting: Internal Medicine

## 2013-11-25 VITALS — BP 132/90 | HR 97 | Temp 98.0°F | Resp 18 | Ht 60.0 in | Wt 126.7 lb

## 2013-11-25 DIAGNOSIS — C3430 Malignant neoplasm of lower lobe, unspecified bronchus or lung: Secondary | ICD-10-CM

## 2013-11-25 DIAGNOSIS — R059 Cough, unspecified: Secondary | ICD-10-CM

## 2013-11-25 DIAGNOSIS — C3490 Malignant neoplasm of unspecified part of unspecified bronchus or lung: Secondary | ICD-10-CM

## 2013-11-25 DIAGNOSIS — R05 Cough: Secondary | ICD-10-CM

## 2013-11-25 DIAGNOSIS — R197 Diarrhea, unspecified: Secondary | ICD-10-CM

## 2013-11-25 LAB — COMPREHENSIVE METABOLIC PANEL (CC13)
ALT: 26 U/L (ref 0–55)
AST: 34 U/L (ref 5–34)
Albumin: 3.7 g/dL (ref 3.5–5.0)
Alkaline Phosphatase: 83 U/L (ref 40–150)
Anion Gap: 8 mEq/L (ref 3–11)
BUN: 12.6 mg/dL (ref 7.0–26.0)
CO2: 29 mEq/L (ref 22–29)
Calcium: 9.2 mg/dL (ref 8.4–10.4)
Chloride: 102 mEq/L (ref 98–109)
Creatinine: 0.6 mg/dL (ref 0.6–1.1)
Glucose: 96 mg/dl (ref 70–140)
Potassium: 4.4 mEq/L (ref 3.5–5.1)
Sodium: 139 mEq/L (ref 136–145)
Total Bilirubin: 0.43 mg/dL (ref 0.20–1.20)
Total Protein: 7.5 g/dL (ref 6.4–8.3)

## 2013-11-25 LAB — CBC WITH DIFFERENTIAL/PLATELET
BASO%: 0.4 % (ref 0.0–2.0)
Basophils Absolute: 0 10*3/uL (ref 0.0–0.1)
EOS%: 1.5 % (ref 0.0–7.0)
Eosinophils Absolute: 0.1 10*3/uL (ref 0.0–0.5)
HCT: 41.1 % (ref 34.8–46.6)
HGB: 13.7 g/dL (ref 11.6–15.9)
LYMPH%: 21.9 % (ref 14.0–49.7)
MCH: 28.4 pg (ref 25.1–34.0)
MCHC: 33.4 g/dL (ref 31.5–36.0)
MCV: 85.2 fL (ref 79.5–101.0)
MONO#: 0.6 10*3/uL (ref 0.1–0.9)
MONO%: 8.2 % (ref 0.0–14.0)
NEUT#: 5.1 10*3/uL (ref 1.5–6.5)
NEUT%: 68 % (ref 38.4–76.8)
Platelets: 307 10*3/uL (ref 145–400)
RBC: 4.82 10*6/uL (ref 3.70–5.45)
RDW: 14.3 % (ref 11.2–14.5)
WBC: 7.5 10*3/uL (ref 3.9–10.3)
lymph#: 1.6 10*3/uL (ref 0.9–3.3)

## 2013-11-25 MED ORDER — LIDOCAINE-PRILOCAINE 2.5-2.5 % EX CREA: TOPICAL_CREAM | CUTANEOUS | Status: DC

## 2013-11-25 MED ORDER — HYDROCODONE-HOMATROPINE 5-1.5 MG/5ML PO SYRP: 5.0000 mL | ORAL_SOLUTION | Freq: Four times a day (QID) | ORAL | Status: DC | PRN

## 2013-11-25 NOTE — Telephone Encounter (Signed)
Gave pt appt for lab and Md on febuary 2015

## 2013-11-25 NOTE — Progress Notes (Signed)
[facility] Telephone:(336) [phone]   Fax:(336) [phone]  OFFICE PROGRESS NOTE  [name]., MD [address] [place] [address]  PRINCIPAL DIAGNOSIS: Local recurrence of non-small cell lung cancer, adenocarcinoma, initially diagnosed as stage IB (T2a N0 M0) in March of 2010.   PRIOR THERAPY:  1. Status post right lower lobectomy under the care of Dr. Laneta Simmers on 04/08/2009. 2. Status post palliative radiotherapy to the right lower lobe recurrent lung mass under the care of Dr. Mitzi Hansen, completed on June 08, 2011. 3. Systemic chemotherapy with carboplatin for AUC of 5 and Alimta 500 mg/M2 every 3 weeks. She is status post 3 cycles.  CURRENT THERAPY: Tarceva 150 mg by mouth daily, therapy beginning 07/24/2012. Status post approximately 15 months of therapy.   CHEMOTHERAPY INTENT: Palliative  CURRENT # OF CHEMOTHERAPY CYCLES: 16  CURRENT ANTIEMETICS: Compazine  CURRENT SMOKING STATUS: Nonsmoker  ORAL CHEMOTHERAPY AND CONSENT: Tarceva  CURRENT BISPHOSPHONATES USE: None  PAIN MANAGEMENT: None  NARCOTICS INDUCED CONSTIPATION: None  LIVING WILL AND CODE STATUS: Full code  INTERVAL HISTORY: [name] 52 y.o. female returns to the clinic today for follow up visit accompanied by her son and her interpreter. The patient is feeling much better today with no specific complaints except for occasional diarrhea. Her skin rash has significantly improved. She denied having any significant chest pain, shortness of breath, cough or hemoptysis.  The patient denied having any significant weight loss or night sweats. She denied having any nausea or vomiting , fever or chills.  She has repeat CT scan of the chest performed recently and she is here for evaluation and discussion of her scan results.  MEDICAL HISTORY: Past Medical History  Diagnosis Date  . Lung cancer     right lower lobe adenocarcinoma  . History of radiation therapy 05/02/11 to 06/08/11    right lung     ALLERGIES:  is allergic to codeine and doxycycline.  MEDICATIONS:  Current Outpatient Prescriptions  Medication Sig Dispense Refill  . acetaminophen (TYLENOL) 325 MG tablet Take 650 mg by mouth every 6 (six) hours as needed. For pain      . albuterol (PROVENTIL HFA;VENTOLIN HFA) 108 (90 BASE) MCG/ACT inhaler Inhale 2 puffs into the lungs every 6 (six) hours as needed for wheezing.  1 Inhaler  2  . clindamycin (CLEOCIN T) 1 % lotion Apply topically 2 (two) times daily.  60 mL  1  . erlotinib (TARCEVA) 150 MG tablet Take 1 tablet (150 mg total) by mouth daily.  30 tablet  0  . fluticasone (CUTIVATE) 0.05 % cream       . HYDROcodone-homatropine (HYCODAN) 5-1.5 MG/5ML syrup Take 5 mLs by mouth every 6 (six) hours as needed for cough.  240 mL  0  . ibuprofen (ADVIL,MOTRIN) 100 MG tablet Take 200 mg by mouth every 6 (six) hours as needed. For pain      . lidocaine-prilocaine (EMLA) cream Apply to Port-A-Cath prior to tx PRN  5 g  prn 1 year   No current facility-administered medications for this visit.    SURGICAL HISTORY:  Past Surgical History  Procedure Laterality Date  . Lung lobectomy  04/18/2009    RLL  . Portacath placement  02/29/2012    Procedure: INSERTION PORT-A-CATH;  Surgeon: Ines Bloomer, MD;  Location: [facility] OR;  Service: Thoracic;  Laterality: Left;  PowerPort 8 F attachable in left internal jugular.    REVIEW OF SYSTEMS:  Constitutional: negative Eyes:  negative Ears, nose, mouth, throat, and face: negative Respiratory: negative Cardiovascular: negative Gastrointestinal: negative Genitourinary:negative Integument/breast: negative Hematologic/lymphatic: negative Musculoskeletal:negative Neurological: negative Behavioral/Psych: negative Endocrine: negative Allergic/Immunologic: negative   PHYSICAL EXAMINATION: General appearance: alert, cooperative and no distress Head: Normocephalic, without obvious abnormality, atraumatic Neck: no adenopathy, no JVD, supple,  symmetrical, trachea midline and thyroid not enlarged, symmetric, no tenderness/mass/nodules Lymph nodes: Cervical, supraclavicular, and axillary nodes normal. Resp: wheezes bilaterally Back: symmetric, no curvature. ROM normal. No CVA tenderness. Cardio: regular rate and rhythm, S1, S2 normal, no murmur, click, rub or gallop GI: soft, non-tender; bowel sounds normal; no masses,  no organomegaly Extremities: extremities normal, atraumatic, no cyanosis or edema Neurologic: Alert and oriented X 3, normal strength and tone. Normal symmetric reflexes. Normal coordination and gait  ECOG PERFORMANCE STATUS: 1 - Symptomatic but completely ambulatory  Blood pressure 132/90, pulse 97, temperature 98 F (36.7 C), temperature source Oral, resp. rate 18, height 5' (1.524 m), weight 126 lb 11.2 oz (57.471 kg).  LABORATORY DATA: Lab Results  Component Value Date   WBC 7.5 11/25/2013   HGB 13.7 11/25/2013   HCT 41.1 11/25/2013   MCV 85.2 11/25/2013   PLT 307 11/25/2013      Chemistry      Component Value Date/Time   NA 139 11/25/2013 1000   NA 137 07/12/2012 1121   NA 141 01/06/2012 0804   K 4.4 11/25/2013 1000   K 3.9 07/12/2012 1121   K 4.6 01/06/2012 0804   CL 102 04/30/2013 1002   CL 104 07/12/2012 1121   CL 98 01/06/2012 0804   CO2 29 11/25/2013 1000   CO2 24 07/12/2012 1121   CO2 28 01/06/2012 0804   BUN 12.6 11/25/2013 1000   BUN 8 07/12/2012 1121   BUN 10 01/06/2012 0804   CREATININE 0.6 11/25/2013 1000   CREATININE 0.45* 07/12/2012 1121   CREATININE 0.6 01/06/2012 0804      Component Value Date/Time   CALCIUM 9.2 11/25/2013 1000   CALCIUM 8.6 07/12/2012 1121   CALCIUM 8.9 01/06/2012 0804   ALKPHOS 83 11/25/2013 1000   ALKPHOS 93 07/12/2012 1121   ALKPHOS 64 01/06/2012 0804   AST 34 11/25/2013 1000   AST 44* 07/12/2012 1121   AST 27 01/06/2012 0804   ALT 26 11/25/2013 1000   ALT 49* 07/12/2012 1121   ALT 24 01/06/2012 0804   BILITOT 0.43 11/25/2013 1000   BILITOT 0.3 07/12/2012 1121   BILITOT  0.60 01/06/2012 0804       RADIOGRAPHIC STUDIES: Ct Chest W Contrast  11/22/2013   CLINICAL DATA:  Restaging non-small cell lung cancer  EXAM: CT CHEST, ABDOMEN, AND PELVIS WITH CONTRAST  TECHNIQUE: Multidetector CT imaging of the chest, abdomen and pelvis was performed following the standard protocol during bolus administration of intravenous contrast.  CONTRAST:  80mL OMNIPAQUE IOHEXOL 300 MG/ML  SOLN  COMPARISON:  08/02/2013  FINDINGS: CT CHEST FINDINGS  Status post right lower lobectomy. Right paramediastinal radiation changes.  Lobulated 10 x 20 mm nodule in the medial right upper lung (series 5/image 13), stable versus minimally decreased.  Small to moderate right pleural effusion, unchanged. Left lung is clear. No pneumothorax.  Visualized thyroid is unremarkable.  Heart is normal in size.  No pericardial effusion.  Left chest port terminating at the cavoatrial junction.  7 mm short axis right paratracheal node (series 2/ image 15), within normal limits by CT. No suspicious axillary lymphadenopathy.  Mild degenerative changes of the thoracic spine.  CT ABDOMEN  AND PELVIS FINDINGS  Liver, spleen, pancreas, and adrenal glands are within normal limits.  Gallbladder is unremarkable. No intrahepatic or extrahepatic ductal dilatation.  Kidneys are within normal limits.  No hydronephrosis.  No evidence of bowel obstruction.  Normal appendix  No evidence of abdominal aortic aneurysm.  No abdominopelvic ascites.  No suspicious abdominopelvic lymphadenopathy.  Uterus is notable for a dominant 11.0 x 12.6 cm mass along the right uterine body (series 2/image 85), favored to reflect a pedunculated fibroid.  Bladder is within normal limits.  Very mild degenerative changes of the lumbar spine.  IMPRESSION: Status post right lower lobectomy. Right paramediastinal radiation changes.  10 x 20 mm lobulated nodule in the medial right upper lobe, stable versus mildly decreased.  Small to moderate right pleural effusion,  unchanged.  No evidence of new/progressive metastatic disease in the chest.  No evidence of metastatic disease in the abdomen/pelvis.   Electronically Signed   By: Charline Bills M.D.   On: 11/22/2013 11:03   ASSESSMENT AND PLAN: This is a very pleasant 52 years old Asian female with recurrent non-small cell lung cancer, adenocarcinoma. She is currently on treatment with oral Tarceva 150 mg by mouth daily status post 15 months of treatment. She is tolerating her treatment with Tarceva fairly well.  Recent scan showed no evidence for disease progression. I discussed the scan results with the patient and her son. I recommended for the patient to continue with the current treatment. She would come back for follow up visit in one month with repeat CBC and comprehensive metabolic panel.  She was advised to call immediately if she has any concerning symptoms in the interval.  The patient voices understanding of current disease status and treatment options and is in agreement with the current care plan.  All questions were answered. The patient knows to call the clinic with any problems, questions or concerns. We can certainly see the patient much sooner if necessary. I spent 15 minutes counseling the patient face to face. The total time spent in the appointment was 25 minutes.

## 2013-11-25 NOTE — Patient Instructions (Signed)
CURRENT THERAPY: Tarceva 150 mg by mouth daily, therapy beginning 07/24/2012. Status post approximately 15 months of therapy.  CHEMOTHERAPY INTENT: Palliative  CURRENT # OF CHEMOTHERAPY CYCLES: 16  CURRENT ANTIEMETICS: Compazine  CURRENT SMOKING STATUS: Nonsmoker  ORAL CHEMOTHERAPY AND CONSENT: Tarceva  CURRENT BISPHOSPHONATES USE: None  PAIN MANAGEMENT: None  NARCOTICS INDUCED CONSTIPATION: None  LIVING WILL AND CODE STATUS: Full code

## 2013-12-23 ENCOUNTER — Other Ambulatory Visit: Payer: 59

## 2013-12-30 ENCOUNTER — Encounter: Payer: Self-pay | Admitting: Physician Assistant

## 2013-12-30 ENCOUNTER — Ambulatory Visit (HOSPITAL_BASED_OUTPATIENT_CLINIC_OR_DEPARTMENT_OTHER): Payer: 59 | Admitting: Physician Assistant

## 2013-12-30 ENCOUNTER — Telehealth: Payer: Self-pay | Admitting: Internal Medicine

## 2013-12-30 ENCOUNTER — Other Ambulatory Visit (HOSPITAL_BASED_OUTPATIENT_CLINIC_OR_DEPARTMENT_OTHER): Payer: 59

## 2013-12-30 VITALS — BP 116/82 | HR 98 | Temp 98.8°F | Resp 18 | Ht 60.0 in | Wt 126.8 lb

## 2013-12-30 DIAGNOSIS — Z452 Encounter for adjustment and management of vascular access device: Secondary | ICD-10-CM

## 2013-12-30 DIAGNOSIS — R059 Cough, unspecified: Secondary | ICD-10-CM

## 2013-12-30 DIAGNOSIS — R05 Cough: Secondary | ICD-10-CM

## 2013-12-30 DIAGNOSIS — C3490 Malignant neoplasm of unspecified part of unspecified bronchus or lung: Secondary | ICD-10-CM

## 2013-12-30 DIAGNOSIS — C3430 Malignant neoplasm of lower lobe, unspecified bronchus or lung: Secondary | ICD-10-CM

## 2013-12-30 LAB — COMPREHENSIVE METABOLIC PANEL (CC13)
ALT: 42 U/L (ref 0–55)
AST: 38 U/L — ABNORMAL HIGH (ref 5–34)
Albumin: 3.8 g/dL (ref 3.5–5.0)
Alkaline Phosphatase: 98 U/L (ref 40–150)
Anion Gap: 9 mEq/L (ref 3–11)
BUN: 12.3 mg/dL (ref 7.0–26.0)
CO2: 30 mEq/L — ABNORMAL HIGH (ref 22–29)
Calcium: 9.5 mg/dL (ref 8.4–10.4)
Chloride: 103 mEq/L (ref 98–109)
Creatinine: 0.6 mg/dL (ref 0.6–1.1)
Glucose: 94 mg/dl (ref 70–140)
Potassium: 4.3 mEq/L (ref 3.5–5.1)
Sodium: 142 mEq/L (ref 136–145)
Total Bilirubin: 0.84 mg/dL (ref 0.20–1.20)
Total Protein: 7.3 g/dL (ref 6.4–8.3)

## 2013-12-30 LAB — CBC WITH DIFFERENTIAL/PLATELET
BASO%: 0.2 % (ref 0.0–2.0)
Basophils Absolute: 0 10*3/uL (ref 0.0–0.1)
EOS%: 0.8 % (ref 0.0–7.0)
Eosinophils Absolute: 0.1 10*3/uL (ref 0.0–0.5)
HCT: 42.1 % (ref 34.8–46.6)
HGB: 13.7 g/dL (ref 11.6–15.9)
LYMPH%: 12.8 % — ABNORMAL LOW (ref 14.0–49.7)
MCH: 27.5 pg (ref 25.1–34.0)
MCHC: 32.6 g/dL (ref 31.5–36.0)
MCV: 84.4 fL (ref 79.5–101.0)
MONO#: 0.7 10*3/uL (ref 0.1–0.9)
MONO%: 7.5 % (ref 0.0–14.0)
NEUT#: 7.5 10*3/uL — ABNORMAL HIGH (ref 1.5–6.5)
NEUT%: 78.7 % — ABNORMAL HIGH (ref 38.4–76.8)
Platelets: 288 10*3/uL (ref 145–400)
RBC: 4.99 10*6/uL (ref 3.70–5.45)
RDW: 14 % (ref 11.2–14.5)
WBC: 9.5 10*3/uL (ref 3.9–10.3)
lymph#: 1.2 10*3/uL (ref 0.9–3.3)

## 2013-12-30 MED ORDER — HEPARIN SOD (PORK) LOCK FLUSH 100 UNIT/ML IV SOLN
500.0000 [IU] | Freq: Once | INTRAVENOUS | Status: AC
  Administered 2013-12-30: 500 [IU] via INTRAVENOUS
  Filled 2013-12-30: qty 5

## 2013-12-30 MED ORDER — SODIUM CHLORIDE 0.9 % IJ SOLN
10.0000 mL | INTRAMUSCULAR | Status: DC | PRN
  Administered 2013-12-30: 10 mL via INTRAVENOUS
  Filled 2013-12-30: qty 10

## 2013-12-30 MED ORDER — HYDROCOD POLST-CHLORPHEN POLST 10-8 MG/5ML PO LQCR: 5.0000 mL | Freq: Two times a day (BID) | ORAL | Status: DC | PRN

## 2013-12-30 NOTE — Progress Notes (Addendum)
[facility] [phone]   [phone]  SHARED VISIT PROGRESS NOTE  [name], MD [place] [address]  PRINCIPAL DIAGNOSIS: Local recurrence of non-small cell lung cancer, adenocarcinoma, initially diagnosed as stage IB (T2a N0 M0) in March of 2010.   PRIOR THERAPY:  1. Status post right lower lobectomy under the care of Dr. Cyndia Bent on 04/08/2009. 2. Status post palliative radiotherapy to the right lower lobe recurrent lung mass under the care of Dr. Lisbeth Renshaw, completed on June 08, 2011. 3. Systemic chemotherapy with carboplatin for AUC of 5 and Alimta 500 mg/M2 every 3 weeks. She is status post 3 cycles.  CURRENT THERAPY: Tarceva 150 mg by mouth daily, therapy beginning 07/24/2012. Status post approximately 16 months of therapy.   CHEMOTHERAPY INTENT: Palliative  CURRENT # OF CHEMOTHERAPY CYCLES: 17  CURRENT ANTIEMETICS: Compazine  CURRENT SMOKING STATUS: Nonsmoker  ORAL CHEMOTHERAPY AND CONSENT: Tarceva  CURRENT BISPHOSPHONATES USE: None  PAIN MANAGEMENT: None  NARCOTICS INDUCED CONSTIPATION: None  LIVING WILL AND CODE STATUS: Full code  INTERVAL HISTORY: [name] 53 y.o. female returns to the clinic today for follow up visit accompanied by her son and her interpreter. The patient is feeling much better today with no specific complaints except for occasional diarrhea. Her skin rash has significantly improved. She denied having any significant chest pain, shortness of breath,  hemoptysis. She continues to have cough he requests a refill for the Tussionex cough syrup and she tolerated this much better. The Hycodan cough syrup makes her nauseous and leads to vomiting. Her Port-A-Cath was last flushed 10/28/2013 The patient denied having any significant weight loss or night sweats. She denied having any nausea or vomiting , fever or chills.   MEDICAL HISTORY: Past Medical History  Diagnosis Date  . Lung cancer     right  lower lobe adenocarcinoma  . History of radiation therapy 05/02/11 to 06/08/11    right lung    ALLERGIES:  is allergic to codeine and doxycycline.  MEDICATIONS:  Current Outpatient Prescriptions  Medication Sig Dispense Refill  . acetaminophen (TYLENOL) 325 MG tablet Take 650 mg by mouth every 6 (six) hours as needed. For pain      . albuterol (PROVENTIL HFA;VENTOLIN HFA) 108 (90 BASE) MCG/ACT inhaler Inhale 2 puffs into the lungs every 6 (six) hours as needed for wheezing.  1 Inhaler  2  . clindamycin (CLEOCIN T) 1 % lotion Apply topically 2 (two) times daily.  60 mL  1  . erlotinib (TARCEVA) 150 MG tablet Take 1 tablet (150 mg total) by mouth daily.  30 tablet  0  . fluticasone (CUTIVATE) 0.05 % cream       . ibuprofen (ADVIL,MOTRIN) 100 MG tablet Take 200 mg by mouth every 6 (six) hours as needed. For pain      . lidocaine-prilocaine (EMLA) cream Apply to Port-A-Cath prior to tx PRN  5 g  prn 1 year  . chlorpheniramine-HYDROcodone (TUSSIONEX) 10-8 MG/5ML LQCR Take 5 mLs by mouth every 12 (twelve) hours as needed for cough.  240 mL  0  . HYDROcodone-homatropine (HYCODAN) 5-1.5 MG/5ML syrup Take 5 mLs by mouth every 6 (six) hours as needed for cough.  240 mL  0   Current Facility-Administered Medications  Medication Dose Route Frequency Provider Last Rate Last Dose  . sodium chloride 0.9 % injection 10 mL  10 mL Intravenous PRN Carlton Adam, PA-C   10 mL at 12/30/13 1157  SURGICAL HISTORY:  Past Surgical History  Procedure Laterality Date  . Lung lobectomy  04/18/2009    RLL  . Portacath placement  02/29/2012    Procedure: INSERTION PORT-A-CATH;  Surgeon: Nicanor Alcon, MD;  Location: [facility];  Service: Thoracic;  Laterality: Left;  PowerPort 8 F attachable in left internal jugular.    REVIEW OF SYSTEMS:  Constitutional: negative Eyes: negative Ears, nose, mouth, throat, and face: negative Respiratory: positive for cough Cardiovascular: negative Gastrointestinal:  negative Genitourinary:negative Integument/breast: negative Hematologic/lymphatic: negative Musculoskeletal:negative Neurological: negative Behavioral/Psych: negative Endocrine: negative Allergic/Immunologic: negative   PHYSICAL EXAMINATION: General appearance: alert, cooperative and no distress Head: Normocephalic, without obvious abnormality, atraumatic Neck: no adenopathy, no JVD, supple, symmetrical, trachea midline and thyroid not enlarged, symmetric, no tenderness/mass/nodules Lymph nodes: Cervical, supraclavicular, and axillary nodes normal. Resp: wheezes bilaterally Back: symmetric, no curvature. ROM normal. No CVA tenderness. Cardio: regular rate and rhythm, S1, S2 normal, no murmur, click, rub or gallop GI: soft, non-tender; bowel sounds normal; no masses,  no organomegaly Extremities: extremities normal, atraumatic, no cyanosis or edema Neurologic: Alert and oriented X 3, normal strength and tone. Normal symmetric reflexes. Normal coordination and gait  ECOG PERFORMANCE STATUS: 1 - Symptomatic but completely ambulatory  Blood pressure 116/82, pulse 98, temperature 98.8 F (37.1 C), temperature source Oral, resp. rate 18, height 5' (1.524 m), weight 126 lb 12.8 oz (57.516 kg), SpO2 96.00%.  LABORATORY DATA: Lab Results  Component Value Date   WBC 9.5 12/30/2013   HGB 13.7 12/30/2013   HCT 42.1 12/30/2013   MCV 84.4 12/30/2013   PLT 288 12/30/2013      Chemistry      Component Value Date/Time   NA 142 12/30/2013 0956   NA 137 07/12/2012 1121   NA 141 01/06/2012 0804   K 4.3 12/30/2013 0956   K 3.9 07/12/2012 1121   K 4.6 01/06/2012 0804   CL 102 04/30/2013 1002   CL 104 07/12/2012 1121   CL 98 01/06/2012 0804   CO2 30* 12/30/2013 0956   CO2 24 07/12/2012 1121   CO2 28 01/06/2012 0804   BUN 12.3 12/30/2013 0956   BUN 8 07/12/2012 1121   BUN 10 01/06/2012 0804   CREATININE 0.6 12/30/2013 0956   CREATININE 0.45* 07/12/2012 1121   CREATININE 0.6 01/06/2012 0804      Component Value Date/Time    CALCIUM 9.5 12/30/2013 0956   CALCIUM 8.6 07/12/2012 1121   CALCIUM 8.9 01/06/2012 0804   ALKPHOS 98 12/30/2013 0956   ALKPHOS 93 07/12/2012 1121   ALKPHOS 64 01/06/2012 0804   AST 38* 12/30/2013 0956   AST 44* 07/12/2012 1121   AST 27 01/06/2012 0804   ALT 42 12/30/2013 0956   ALT 49* 07/12/2012 1121   ALT 24 01/06/2012 0804   BILITOT 0.84 12/30/2013 0956   BILITOT 0.3 07/12/2012 1121   BILITOT 0.60 01/06/2012 0804       RADIOGRAPHIC STUDIES: Ct Chest W Contrast  11/22/2013   CLINICAL DATA:  Restaging non-small cell lung cancer  EXAM: CT CHEST, ABDOMEN, AND PELVIS WITH CONTRAST  TECHNIQUE: Multidetector CT imaging of the chest, abdomen and pelvis was performed following the standard protocol during bolus administration of intravenous contrast.  CONTRAST:  26mL OMNIPAQUE IOHEXOL 300 MG/ML  SOLN  COMPARISON:  08/02/2013  FINDINGS: CT CHEST FINDINGS  Status post right lower lobectomy. Right paramediastinal radiation changes.  Lobulated 10 x 20 mm nodule in the medial right upper lung (series 5/image 13), stable versus minimally  decreased.  Small to moderate right pleural effusion, unchanged. Left lung is clear. No pneumothorax.  Visualized thyroid is unremarkable.  Heart is normal in size.  No pericardial effusion.  Left chest port terminating at the cavoatrial junction.  7 mm short axis right paratracheal node (series 2/ image 15), within normal limits by CT. No suspicious axillary lymphadenopathy.  Mild degenerative changes of the thoracic spine.  CT ABDOMEN AND PELVIS FINDINGS  Liver, spleen, pancreas, and adrenal glands are within normal limits.  Gallbladder is unremarkable. No intrahepatic or extrahepatic ductal dilatation.  Kidneys are within normal limits.  No hydronephrosis.  No evidence of bowel obstruction.  Normal appendix  No evidence of abdominal aortic aneurysm.  No abdominopelvic ascites.  No suspicious abdominopelvic lymphadenopathy.  Uterus is notable for a dominant 11.0 x 12.6 cm mass along the right  uterine body (series 2/image 85), favored to reflect a pedunculated fibroid.  Bladder is within normal limits.  Very mild degenerative changes of the lumbar spine.  IMPRESSION: Status post right lower lobectomy. Right paramediastinal radiation changes.  10 x 20 mm lobulated nodule in the medial right upper lobe, stable versus mildly decreased.  Small to moderate right pleural effusion, unchanged.  No evidence of new/progressive metastatic disease in the chest.  No evidence of metastatic disease in the abdomen/pelvis.   Electronically Signed   By: Julian Hy M.D.   On: 11/22/2013 11:03   ASSESSMENT AND PLAN: This is a very pleasant 53 years old Asian female with recurrent non-small cell lung cancer, adenocarcinoma. She is currently on treatment with oral Tarceva 150 mg by mouth daily status post 16 months of treatment. She is tolerating her treatment with Tarceva fairly well.  Recent scan showed no evidence for disease progression. The patient was discussed with and also seen by Dr. Julien Nordmann. She was given a prescription for her Tussionex cough syrup. Her Port-A-Cath was flushed today. He will next need flushing in the next 8 weeks. She'll continue on her Tarceva at 150 mg by mouth daily. She'll return in one month for another symptom management visit. She was advised to call immediately if she has any concerning symptoms in the interval.  The patient voices understanding of current disease status and treatment options and is in agreement with the current care plan.  All questions were answered. The patient knows to call the clinic with any problems, questions or concerns. We can certainly see the patient much sooner if necessary.  Carlton Adam PA-C   ADDENDUM: Hematology/Oncology Attending: I had the face to face encounter with the patient today. I recommended her care plan. The patient is a very pleasant 52 years old Asian female with recurrent non-small cell lung cancer, adenocarcinoma.  She is currently on treatment with Tarceva 150 mg by mouth daily for the last 16 months and tolerating it fairly well. I recommended for the patient to continue her treatment with no change. She would come back for followup visit in one month's for reevaluation. She was advised to call immediately if she has any concerning symptoms in the interval.  Disclaimer: This note was dictated with voice recognition software. Similar sounding words can inadvertently be transcribed and may not be corrected upon review.  [name], MD 12/30/2013

## 2013-12-30 NOTE — Patient Instructions (Addendum)

## 2013-12-30 NOTE — Telephone Encounter (Signed)
gv pt/son appt schedule for march.

## 2014-01-27 ENCOUNTER — Encounter: Payer: Self-pay | Admitting: Internal Medicine

## 2014-01-27 ENCOUNTER — Other Ambulatory Visit (HOSPITAL_BASED_OUTPATIENT_CLINIC_OR_DEPARTMENT_OTHER): Payer: 59

## 2014-01-27 ENCOUNTER — Ambulatory Visit (HOSPITAL_BASED_OUTPATIENT_CLINIC_OR_DEPARTMENT_OTHER): Payer: 59 | Admitting: Internal Medicine

## 2014-01-27 ENCOUNTER — Telehealth: Payer: Self-pay | Admitting: Internal Medicine

## 2014-01-27 VITALS — BP 122/88 | HR 92 | Temp 98.5°F | Resp 19 | Ht 60.0 in | Wt 126.5 lb

## 2014-01-27 DIAGNOSIS — C3430 Malignant neoplasm of lower lobe, unspecified bronchus or lung: Secondary | ICD-10-CM

## 2014-01-27 DIAGNOSIS — C3490 Malignant neoplasm of unspecified part of unspecified bronchus or lung: Secondary | ICD-10-CM

## 2014-01-27 LAB — COMPREHENSIVE METABOLIC PANEL (CC13)
ALT: 37 U/L (ref 0–55)
AST: 34 U/L (ref 5–34)
Albumin: 3.8 g/dL (ref 3.5–5.0)
Alkaline Phosphatase: 92 U/L (ref 40–150)
Anion Gap: 7 mEq/L (ref 3–11)
BUN: 12 mg/dL (ref 7.0–26.0)
CO2: 29 mEq/L (ref 22–29)
Calcium: 9.7 mg/dL (ref 8.4–10.4)
Chloride: 105 mEq/L (ref 98–109)
Creatinine: 0.6 mg/dL (ref 0.6–1.1)
Glucose: 105 mg/dl (ref 70–140)
Potassium: 4.5 mEq/L (ref 3.5–5.1)
Sodium: 140 mEq/L (ref 136–145)
Total Bilirubin: 0.86 mg/dL (ref 0.20–1.20)
Total Protein: 7.9 g/dL (ref 6.4–8.3)

## 2014-01-27 LAB — CBC WITH DIFFERENTIAL/PLATELET
BASO%: 0.4 % (ref 0.0–2.0)
Basophils Absolute: 0 10*3/uL (ref 0.0–0.1)
EOS%: 0.8 % (ref 0.0–7.0)
Eosinophils Absolute: 0.1 10*3/uL (ref 0.0–0.5)
HCT: 43.9 % (ref 34.8–46.6)
HGB: 14.4 g/dL (ref 11.6–15.9)
LYMPH%: 16.7 % (ref 14.0–49.7)
MCH: 27.5 pg (ref 25.1–34.0)
MCHC: 32.7 g/dL (ref 31.5–36.0)
MCV: 84.1 fL (ref 79.5–101.0)
MONO#: 0.6 10*3/uL (ref 0.1–0.9)
MONO%: 6 % (ref 0.0–14.0)
NEUT#: 7.7 10*3/uL — ABNORMAL HIGH (ref 1.5–6.5)
NEUT%: 76.1 % (ref 38.4–76.8)
Platelets: 312 10*3/uL (ref 145–400)
RBC: 5.21 10*6/uL (ref 3.70–5.45)
RDW: 14.6 % — ABNORMAL HIGH (ref 11.2–14.5)
WBC: 10.2 10*3/uL (ref 3.9–10.3)
lymph#: 1.7 10*3/uL (ref 0.9–3.3)

## 2014-01-27 NOTE — Progress Notes (Signed)
[facility] [phone]   Fax:(336) [phone]  OFFICE PROGRESS NOTE  [name], MD [place] [address]  PRINCIPAL DIAGNOSIS: Local recurrence of non-small cell lung cancer, adenocarcinoma, initially diagnosed as stage IB (T2a N0 M0) in March of 2010.   PRIOR THERAPY:  1. Status post right lower lobectomy under the care of Dr. Cyndia Bent on 04/08/2009. 2. Status post palliative radiotherapy to the right lower lobe recurrent lung mass under the care of Dr. Lisbeth Renshaw, completed on June 08, 2011. 3. Systemic chemotherapy with carboplatin for AUC of 5 and Alimta 500 mg/M2 every 3 weeks. She is status post 3 cycles.  CURRENT THERAPY: Tarceva 150 mg by mouth daily, therapy beginning 07/24/2012. Status post approximately 16 months of therapy.   CHEMOTHERAPY INTENT: Palliative  CURRENT # OF CHEMOTHERAPY CYCLES: 17 CURRENT ANTIEMETICS: Compazine  CURRENT SMOKING STATUS: Nonsmoker  ORAL CHEMOTHERAPY AND CONSENT: Tarceva  CURRENT BISPHOSPHONATES USE: None  PAIN MANAGEMENT: None  NARCOTICS INDUCED CONSTIPATION: None  LIVING WILL AND CODE STATUS: Full code  INTERVAL HISTORY: [name] 53 y.o. female returns to the clinic today for follow up visit accompanied by her son. The patient is feeling much better today with no specific complaints. She denied having any significant skin rash or diarrhea. She denied having any significant chest pain, shortness of breath, cough or hemoptysis. The patient denied having any significant weight loss or night sweats. She denied having any nausea or vomiting , fever or chills.    MEDICAL HISTORY: Past Medical History  Diagnosis Date  . Lung cancer     right lower lobe adenocarcinoma  . History of radiation therapy 05/02/11 to 06/08/11    right lung    ALLERGIES:  is allergic to codeine and doxycycline.  MEDICATIONS:  Current Outpatient Prescriptions  Medication Sig Dispense Refill  .  Aspirin-Salicylamide-Caffeine (BC HEADACHE POWDER PO) Take 1 packet by mouth as needed.      . clindamycin (CLEOCIN T) 1 % lotion Apply topically 2 (two) times daily.  60 mL  1  . erlotinib (TARCEVA) 150 MG tablet Take 1 tablet (150 mg total) by mouth daily.  30 tablet  0  . lidocaine-prilocaine (EMLA) cream Apply to Port-A-Cath prior to tx PRN  5 g  prn 1 year  . Pseudoeph-Doxylamine-DM-APAP (NYQUIL PO) Take by mouth as needed.      Marland Kitchen acetaminophen (TYLENOL) 325 MG tablet Take 650 mg by mouth every 6 (six) hours as needed. For pain      . albuterol (PROVENTIL HFA;VENTOLIN HFA) 108 (90 BASE) MCG/ACT inhaler Inhale 2 puffs into the lungs every 6 (six) hours as needed for wheezing.  1 Inhaler  2  . chlorpheniramine-HYDROcodone (TUSSIONEX) 10-8 MG/5ML LQCR Take 5 mLs by mouth every 12 (twelve) hours as needed for cough.  240 mL  0  . fluticasone (CUTIVATE) 0.05 % cream       . HYDROcodone-homatropine (HYCODAN) 5-1.5 MG/5ML syrup Take 5 mLs by mouth every 6 (six) hours as needed for cough.  240 mL  0  . ibuprofen (ADVIL,MOTRIN) 100 MG tablet Take 200 mg by mouth every 6 (six) hours as needed. For pain       No current facility-administered medications for this visit.    SURGICAL HISTORY:  Past Surgical History  Procedure Laterality Date  . Lung lobectomy  04/18/2009    RLL  . Portacath placement  02/29/2012    Procedure: INSERTION PORT-A-CATH;  Surgeon: Nicanor Alcon,  MD;  Location: [facility];  Service: Thoracic;  Laterality: Left;  PowerPort 8 F attachable in left internal jugular.    REVIEW OF SYSTEMS:  Constitutional: negative Eyes: negative Ears, nose, mouth, throat, and face: negative Respiratory: negative Cardiovascular: negative Gastrointestinal: negative Genitourinary:negative Integument/breast: negative Hematologic/lymphatic: negative Musculoskeletal:negative Neurological: negative Behavioral/Psych: negative Endocrine: negative Allergic/Immunologic: negative   PHYSICAL  EXAMINATION: General appearance: alert, cooperative and no distress Head: Normocephalic, without obvious abnormality, atraumatic Neck: no adenopathy, no JVD, supple, symmetrical, trachea midline and thyroid not enlarged, symmetric, no tenderness/mass/nodules Lymph nodes: Cervical, supraclavicular, and axillary nodes normal. Resp: wheezes bilaterally Back: symmetric, no curvature. ROM normal. No CVA tenderness. Cardio: regular rate and rhythm, S1, S2 normal, no murmur, click, rub or gallop GI: soft, non-tender; bowel sounds normal; no masses,  no organomegaly Extremities: extremities normal, atraumatic, no cyanosis or edema Neurologic: Alert and oriented X 3, normal strength and tone. Normal symmetric reflexes. Normal coordination and gait  ECOG PERFORMANCE STATUS: 1 - Symptomatic but completely ambulatory  Blood pressure 122/88, pulse 92, temperature 98.5 F (36.9 C), temperature source Oral, resp. rate 19, height 5' (1.524 m), weight 126 lb 8 oz (57.38 kg), SpO2 100.00%.  LABORATORY DATA: Lab Results  Component Value Date   WBC 10.2 01/27/2014   HGB 14.4 01/27/2014   HCT 43.9 01/27/2014   MCV 84.1 01/27/2014   PLT 312 01/27/2014      Chemistry      Component Value Date/Time   NA 140 01/27/2014 0857   NA 137 07/12/2012 1121   NA 141 01/06/2012 0804   K 4.5 01/27/2014 0857   K 3.9 07/12/2012 1121   K 4.6 01/06/2012 0804   CL 102 04/30/2013 1002   CL 104 07/12/2012 1121   CL 98 01/06/2012 0804   CO2 29 01/27/2014 0857   CO2 24 07/12/2012 1121   CO2 28 01/06/2012 0804   BUN 12.0 01/27/2014 0857   BUN 8 07/12/2012 1121   BUN 10 01/06/2012 0804   CREATININE 0.6 01/27/2014 0857   CREATININE 0.45* 07/12/2012 1121   CREATININE 0.6 01/06/2012 0804      Component Value Date/Time   CALCIUM 9.7 01/27/2014 0857   CALCIUM 8.6 07/12/2012 1121   CALCIUM 8.9 01/06/2012 0804   ALKPHOS 92 01/27/2014 0857   ALKPHOS 93 07/12/2012 1121   ALKPHOS 64 01/06/2012 0804   AST 34 01/27/2014 0857   AST 44* 07/12/2012 1121   AST 27 01/06/2012  0804   ALT 37 01/27/2014 0857   ALT 49* 07/12/2012 1121   ALT 24 01/06/2012 0804   BILITOT 0.86 01/27/2014 0857   BILITOT 0.3 07/12/2012 1121   BILITOT 0.60 01/06/2012 0804       RADIOGRAPHIC STUDIES:  ASSESSMENT AND PLAN: This is a very pleasant 53 years old Asian female with recurrent non-small cell lung cancer, adenocarcinoma. She is currently on treatment with oral Tarceva 150 mg by mouth daily status post 16 months of treatment. She is tolerating her treatment with Tarceva fairly well.   I recommended for the patient to continue with the current treatment. She would come back for follow up visit in one month with repeat CBC and comprehensive metabolic panel as well as CT scan of the chest.  She was advised to call immediately if she has any concerning symptoms in the interval.  The patient voices understanding of current disease status and treatment options and is in agreement with the current care plan.  All questions were answered. The patient knows to call  the clinic with any problems, questions or concerns. We can certainly see the patient much sooner if necessary.  Disclaimer: This note was dictated with voice recognition software. Similar sounding words can inadvertently be transcribed and may not be corrected upon review.

## 2014-01-27 NOTE — Telephone Encounter (Signed)
Gave pt appt for lab,md, flush and Ct for April to june 2015

## 2014-02-10 ENCOUNTER — Ambulatory Visit (HOSPITAL_BASED_OUTPATIENT_CLINIC_OR_DEPARTMENT_OTHER): Payer: 59

## 2014-02-10 VITALS — BP 123/89 | HR 100 | Temp 97.7°F

## 2014-02-10 DIAGNOSIS — Z452 Encounter for adjustment and management of vascular access device: Secondary | ICD-10-CM

## 2014-02-10 DIAGNOSIS — Z95828 Presence of other vascular implants and grafts: Secondary | ICD-10-CM

## 2014-02-10 DIAGNOSIS — C3430 Malignant neoplasm of lower lobe, unspecified bronchus or lung: Secondary | ICD-10-CM

## 2014-02-10 MED ORDER — HEPARIN SOD (PORK) LOCK FLUSH 100 UNIT/ML IV SOLN
500.0000 [IU] | Freq: Once | INTRAVENOUS | Status: AC
  Administered 2014-02-10: 500 [IU] via INTRAVENOUS
  Filled 2014-02-10: qty 5

## 2014-02-10 MED ORDER — SODIUM CHLORIDE 0.9 % IJ SOLN
10.0000 mL | INTRAMUSCULAR | Status: DC | PRN
  Administered 2014-02-10: 10 mL via INTRAVENOUS
  Filled 2014-02-10: qty 10

## 2014-02-10 NOTE — Patient Instructions (Signed)

## 2014-02-18 ENCOUNTER — Other Ambulatory Visit: Payer: Self-pay | Admitting: *Deleted

## 2014-02-18 ENCOUNTER — Telehealth: Payer: Self-pay | Admitting: Medical Oncology

## 2014-02-18 NOTE — Telephone Encounter (Signed)
Received fax for tarceva refill.Sent to Dr Julien Nordmann .

## 2014-02-18 NOTE — Telephone Encounter (Signed)
THIS REFILL REQUEST FOR TARCEVA WAS GIVEN TO DR.[name]'S NURSE, DIANE BELL,RN.

## 2014-02-20 ENCOUNTER — Other Ambulatory Visit: Payer: Self-pay | Admitting: Medical Oncology

## 2014-02-20 NOTE — Telephone Encounter (Signed)
Tarceva reordered with 1 refill. Faxed to [facility].

## 2014-02-24 ENCOUNTER — Encounter (HOSPITAL_COMMUNITY): Payer: Self-pay

## 2014-02-24 ENCOUNTER — Ambulatory Visit (HOSPITAL_COMMUNITY)
Admission: RE | Admit: 2014-02-24 | Discharge: 2014-02-24 | Disposition: A | Discharge status: Home / Self Care | Payer: 59 | Source: Ambulatory Visit | Attending: Internal Medicine | Admitting: Internal Medicine

## 2014-02-24 DIAGNOSIS — C3430 Malignant neoplasm of lower lobe, unspecified bronchus or lung: Secondary | ICD-10-CM | POA: Insufficient documentation

## 2014-02-24 DIAGNOSIS — Z902 Acquired absence of lung [part of]: Secondary | ICD-10-CM | POA: Insufficient documentation

## 2014-02-24 DIAGNOSIS — C3490 Malignant neoplasm of unspecified part of unspecified bronchus or lung: Secondary | ICD-10-CM

## 2014-02-24 DIAGNOSIS — R911 Solitary pulmonary nodule: Secondary | ICD-10-CM | POA: Insufficient documentation

## 2014-02-24 DIAGNOSIS — J90 Pleural effusion, not elsewhere classified: Secondary | ICD-10-CM | POA: Insufficient documentation

## 2014-02-24 MED ORDER — IOHEXOL 300 MG/ML  SOLN
80.0000 mL | Freq: Once | INTRAMUSCULAR | Status: AC | PRN
Start: 2014-02-24 — End: 2014-02-24
  Administered 2014-02-24: 80 mL via INTRAVENOUS

## 2014-03-03 ENCOUNTER — Encounter: Payer: Self-pay | Admitting: Internal Medicine

## 2014-03-03 ENCOUNTER — Other Ambulatory Visit (HOSPITAL_BASED_OUTPATIENT_CLINIC_OR_DEPARTMENT_OTHER): Payer: 59

## 2014-03-03 ENCOUNTER — Ambulatory Visit (HOSPITAL_BASED_OUTPATIENT_CLINIC_OR_DEPARTMENT_OTHER): Payer: 59 | Admitting: Internal Medicine

## 2014-03-03 ENCOUNTER — Telehealth: Payer: Self-pay | Admitting: Internal Medicine

## 2014-03-03 VITALS — BP 120/86 | HR 95 | Temp 99.0°F | Resp 18 | Ht 60.0 in | Wt 129.3 lb

## 2014-03-03 DIAGNOSIS — C3430 Malignant neoplasm of lower lobe, unspecified bronchus or lung: Secondary | ICD-10-CM

## 2014-03-03 DIAGNOSIS — C3490 Malignant neoplasm of unspecified part of unspecified bronchus or lung: Secondary | ICD-10-CM

## 2014-03-03 DIAGNOSIS — R197 Diarrhea, unspecified: Secondary | ICD-10-CM

## 2014-03-03 LAB — COMPREHENSIVE METABOLIC PANEL (CC13)
ALT: 57 U/L — ABNORMAL HIGH (ref 0–55)
AST: 51 U/L — ABNORMAL HIGH (ref 5–34)
Albumin: 3.9 g/dL (ref 3.5–5.0)
Alkaline Phosphatase: 110 U/L (ref 40–150)
Anion Gap: 13 mEq/L — ABNORMAL HIGH (ref 3–11)
BUN: 16.5 mg/dL (ref 7.0–26.0)
CO2: 28 mEq/L (ref 22–29)
Calcium: 9.6 mg/dL (ref 8.4–10.4)
Chloride: 102 mEq/L (ref 98–109)
Creatinine: 0.6 mg/dL (ref 0.6–1.1)
Glucose: 107 mg/dl (ref 70–140)
Potassium: 4.8 mEq/L (ref 3.5–5.1)
Sodium: 142 mEq/L (ref 136–145)
Total Bilirubin: 0.48 mg/dL (ref 0.20–1.20)
Total Protein: 7.9 g/dL (ref 6.4–8.3)

## 2014-03-03 LAB — CBC WITH DIFFERENTIAL/PLATELET
BASO%: 0.3 % (ref 0.0–2.0)
Basophils Absolute: 0 10*3/uL (ref 0.0–0.1)
EOS%: 0.8 % (ref 0.0–7.0)
Eosinophils Absolute: 0.1 10*3/uL (ref 0.0–0.5)
HCT: 41.8 % (ref 34.8–46.6)
HGB: 13.7 g/dL (ref 11.6–15.9)
LYMPH%: 19.8 % (ref 14.0–49.7)
MCH: 28 pg (ref 25.1–34.0)
MCHC: 32.7 g/dL (ref 31.5–36.0)
MCV: 85.6 fL (ref 79.5–101.0)
MONO#: 0.6 10*3/uL (ref 0.1–0.9)
MONO%: 6.3 % (ref 0.0–14.0)
NEUT#: 6.9 10*3/uL — ABNORMAL HIGH (ref 1.5–6.5)
NEUT%: 72.8 % (ref 38.4–76.8)
Platelets: 341 10*3/uL (ref 145–400)
RBC: 4.88 10*6/uL (ref 3.70–5.45)
RDW: 15.6 % — ABNORMAL HIGH (ref 11.2–14.5)
WBC: 9.6 10*3/uL (ref 3.9–10.3)
lymph#: 1.9 10*3/uL (ref 0.9–3.3)

## 2014-03-03 NOTE — Telephone Encounter (Signed)
gv adn printed aptp sched and avs for pt for April adn may

## 2014-03-03 NOTE — Progress Notes (Signed)
[facility] [phone]   [phone]  OFFICE PROGRESS NOTE  [name]., MD [place] [address]  PRINCIPAL DIAGNOSIS: Local recurrence of non-small cell lung cancer, adenocarcinoma, initially diagnosed as stage IB (T2a N0 M0) in March of 2010.   PRIOR THERAPY:  1. Status post right lower lobectomy under the care of Dr. Cyndia Bent on 04/08/2009. 2. Status post palliative radiotherapy to the right lower lobe recurrent lung mass under the care of Dr. Lisbeth Renshaw, completed on June 08, 2011. 3. Systemic chemotherapy with carboplatin for AUC of 5 and Alimta 500 mg/M2 every 3 weeks. She is status post 3 cycles.  CURRENT THERAPY: Tarceva 150 mg by mouth daily, therapy beginning 07/24/2012. Status post approximately 17 months of therapy.   CHEMOTHERAPY INTENT: Palliative  CURRENT # OF CHEMOTHERAPY CYCLES: 18 CURRENT ANTIEMETICS: Compazine  CURRENT SMOKING STATUS: Nonsmoker  ORAL CHEMOTHERAPY AND CONSENT: Tarceva  CURRENT BISPHOSPHONATES USE: None  PAIN MANAGEMENT: None  NARCOTICS INDUCED CONSTIPATION: None  LIVING WILL AND CODE STATUS: Full code  INTERVAL HISTORY: [name] 53 y.o. female returns to the clinic today for follow up visit accompanied by her son. The patient is feeling much better today with no specific complaints. She denied having any significant skin rash but has occasional diarrhea. She denied having any significant chest pain, shortness of breath, with occasional dry cough but no hemoptysis. The patient denied having any significant weight loss or night sweats. She denied having any nausea or vomiting , fever or chills. She had repeat CT scan of the chest performed recently and she is here for evaluation and discussion of her scan results.   MEDICAL HISTORY: Past Medical History  Diagnosis Date  . Lung cancer     right lower lobe adenocarcinoma  . History of radiation therapy 05/02/11 to 06/08/11    right lung     ALLERGIES:  is allergic to codeine and doxycycline.  MEDICATIONS:  Current Outpatient Prescriptions  Medication Sig Dispense Refill  . acetaminophen (TYLENOL) 325 MG tablet Take 650 mg by mouth every 6 (six) hours as needed. For pain      . albuterol (PROVENTIL HFA;VENTOLIN HFA) 108 (90 BASE) MCG/ACT inhaler Inhale 2 puffs into the lungs every 6 (six) hours as needed for wheezing.  1 Inhaler  2  . Aspirin-Salicylamide-Caffeine (BC HEADACHE POWDER PO) Take 1 packet by mouth as needed.      . chlorpheniramine-HYDROcodone (TUSSIONEX) 10-8 MG/5ML LQCR Take 5 mLs by mouth every 12 (twelve) hours as needed for cough.  240 mL  0  . clindamycin (CLEOCIN T) 1 % lotion Apply topically 2 (two) times daily.  60 mL  1  . erlotinib (TARCEVA) 150 MG tablet Take 1 tablet (150 mg total) by mouth daily.  30 tablet  0  . fluticasone (CUTIVATE) 0.05 % cream       . HYDROcodone-homatropine (HYCODAN) 5-1.5 MG/5ML syrup Take 5 mLs by mouth every 6 (six) hours as needed for cough.  240 mL  0  . ibuprofen (ADVIL,MOTRIN) 100 MG tablet Take 200 mg by mouth every 6 (six) hours as needed. For pain      . lidocaine-prilocaine (EMLA) cream Apply to Port-A-Cath prior to tx PRN  5 g  prn 1 year  . Pseudoeph-Doxylamine-DM-APAP (NYQUIL PO) Take by mouth as needed.       No current facility-administered medications for this visit.    SURGICAL HISTORY:  Past Surgical History  Procedure Laterality Date  .  Lung lobectomy  04/18/2009    RLL  . Portacath placement  02/29/2012    Procedure: INSERTION PORT-A-CATH;  Surgeon: Nicanor Alcon, MD;  Location: [facility];  Service: Thoracic;  Laterality: Left;  PowerPort 8 F attachable in left internal jugular.    REVIEW OF SYSTEMS:  Constitutional: negative Eyes: negative Ears, nose, mouth, throat, and face: negative Respiratory: negative Cardiovascular: negative Gastrointestinal: negative Genitourinary:negative Integument/breast: negative Hematologic/lymphatic:  negative Musculoskeletal:negative Neurological: negative Behavioral/Psych: negative Endocrine: negative Allergic/Immunologic: negative   PHYSICAL EXAMINATION: General appearance: alert, cooperative and no distress Head: Normocephalic, without obvious abnormality, atraumatic Neck: no adenopathy, no JVD, supple, symmetrical, trachea midline and thyroid not enlarged, symmetric, no tenderness/mass/nodules Lymph nodes: Cervical, supraclavicular, and axillary nodes normal. Resp: wheezes bilaterally Back: symmetric, no curvature. ROM normal. No CVA tenderness. Cardio: regular rate and rhythm, S1, S2 normal, no murmur, click, rub or gallop GI: soft, non-tender; bowel sounds normal; no masses,  no organomegaly Extremities: extremities normal, atraumatic, no cyanosis or edema Neurologic: Alert and oriented X 3, normal strength and tone. Normal symmetric reflexes. Normal coordination and gait  ECOG PERFORMANCE STATUS: 1 - Symptomatic but completely ambulatory  Blood pressure 120/86, pulse 95, temperature 99 F (37.2 C), temperature source Oral, resp. rate 18, height 5' (1.524 m), weight 129 lb 4.8 oz (58.65 kg).  LABORATORY DATA: Lab Results  Component Value Date   WBC 9.6 03/03/2014   HGB 13.7 03/03/2014   HCT 41.8 03/03/2014   MCV 85.6 03/03/2014   PLT 341 03/03/2014      Chemistry      Component Value Date/Time   NA 140 01/27/2014 0857   NA 137 07/12/2012 1121   NA 141 01/06/2012 0804   K 4.5 01/27/2014 0857   K 3.9 07/12/2012 1121   K 4.6 01/06/2012 0804   CL 102 04/30/2013 1002   CL 104 07/12/2012 1121   CL 98 01/06/2012 0804   CO2 29 01/27/2014 0857   CO2 24 07/12/2012 1121   CO2 28 01/06/2012 0804   BUN 12.0 01/27/2014 0857   BUN 8 07/12/2012 1121   BUN 10 01/06/2012 0804   CREATININE 0.6 01/27/2014 0857   CREATININE 0.45* 07/12/2012 1121   CREATININE 0.6 01/06/2012 0804      Component Value Date/Time   CALCIUM 9.7 01/27/2014 0857   CALCIUM 8.6 07/12/2012 1121   CALCIUM 8.9 01/06/2012 0804   ALKPHOS 92  01/27/2014 0857   ALKPHOS 93 07/12/2012 1121   ALKPHOS 64 01/06/2012 0804   AST 34 01/27/2014 0857   AST 44* 07/12/2012 1121   AST 27 01/06/2012 0804   ALT 37 01/27/2014 0857   ALT 49* 07/12/2012 1121   ALT 24 01/06/2012 0804   BILITOT 0.86 01/27/2014 0857   BILITOT 0.3 07/12/2012 1121   BILITOT 0.60 01/06/2012 0804       RADIOGRAPHIC STUDIES: Ct Chest W Contrast  02/24/2014   CLINICAL DATA:  Restaging non-small cell lung cancer. Status post right lower lobectomy.  EXAM: CT CHEST WITH CONTRAST  TECHNIQUE: Multidetector CT imaging of the chest was performed during intravenous contrast administration.  CONTRAST:  25mL OMNIPAQUE IOHEXOL 300 MG/ML  SOLN  COMPARISON:  CT CHEST W/CM dated 11/22/2013  FINDINGS: Port in the medial left chest wall. No axillary or supraclavicular lymphadenopathy. No mediastinal hilar lymphadenopathy.  There is paramediastinal consolidation with air bronchograms in the left right upper lobe unchanged from prior and consistent radiation change. Postsurgical change in the lower lobe consistent with lower lobectomy. Small effusion.  Within  the right upper lobe there is a elongated lobular nodule measuring 20 mm x 9 mm not changed from 20 mm x 10 mm on prior. No new nodularity.  Limited view of the upper abdomen is unremarkable. Limited view of the skeleton is unremarkable.  IMPRESSION: 1. Stable elongated nodule in the right upper lobe. 2. Postradiation and surgical change in the right hemithorax is unchanged. 3. No evidence of lung cancer progression.   Electronically Signed   By: Suzy Bouchard M.D.   On: 02/24/2014 08:17   ASSESSMENT AND PLAN: This is a very pleasant 53 years old Asian female with recurrent non-small cell lung cancer, adenocarcinoma. She is currently on treatment with oral Tarceva 150 mg by mouth daily status post 17 months of treatment. She is tolerating her treatment with Tarceva fairly well.  Her recent CT scan of the chest showed no evidence for disease progression. I  discussed the scan results with the patient and her son. I recommended for the patient to continue with the current treatment. For diarrhea, she will continue on Imodium on as-needed basis She would come back for follow up visit in one month with repeat CBC and comprehensive metabolic panel.   She was advised to call immediately if she has any concerning symptoms in the interval.  The patient voices understanding of current disease status and treatment options and is in agreement with the current care plan.  All questions were answered. The patient knows to call the clinic with any problems, questions or concerns. We can certainly see the patient much sooner if necessary.  Disclaimer: This note was dictated with voice recognition software. Similar sounding words can inadvertently be transcribed and may not be corrected upon review.

## 2014-03-24 ENCOUNTER — Ambulatory Visit (HOSPITAL_BASED_OUTPATIENT_CLINIC_OR_DEPARTMENT_OTHER): Payer: 59

## 2014-03-24 VITALS — BP 128/72 | HR 108 | Temp 98.0°F

## 2014-03-24 DIAGNOSIS — Z95828 Presence of other vascular implants and grafts: Secondary | ICD-10-CM

## 2014-03-24 DIAGNOSIS — Z452 Encounter for adjustment and management of vascular access device: Secondary | ICD-10-CM

## 2014-03-24 DIAGNOSIS — C3430 Malignant neoplasm of lower lobe, unspecified bronchus or lung: Secondary | ICD-10-CM

## 2014-03-24 MED ORDER — SODIUM CHLORIDE 0.9 % IJ SOLN
10.0000 mL | INTRAMUSCULAR | Status: DC | PRN
  Administered 2014-03-24: 10 mL via INTRAVENOUS
  Filled 2014-03-24: qty 10

## 2014-03-24 MED ORDER — HEPARIN SOD (PORK) LOCK FLUSH 100 UNIT/ML IV SOLN
500.0000 [IU] | Freq: Once | INTRAVENOUS | Status: AC
  Administered 2014-03-24: 500 [IU] via INTRAVENOUS
  Filled 2014-03-24: qty 5

## 2014-03-24 NOTE — Patient Instructions (Signed)

## 2014-03-31 ENCOUNTER — Ambulatory Visit (HOSPITAL_BASED_OUTPATIENT_CLINIC_OR_DEPARTMENT_OTHER): Payer: 59 | Admitting: Internal Medicine

## 2014-03-31 ENCOUNTER — Encounter: Payer: Self-pay | Admitting: Internal Medicine

## 2014-03-31 ENCOUNTER — Other Ambulatory Visit (HOSPITAL_BASED_OUTPATIENT_CLINIC_OR_DEPARTMENT_OTHER): Payer: 59

## 2014-03-31 ENCOUNTER — Telehealth: Payer: Self-pay | Admitting: Internal Medicine

## 2014-03-31 VITALS — BP 125/80 | HR 93 | Temp 97.8°F | Resp 18 | Ht 60.0 in | Wt 128.5 lb

## 2014-03-31 DIAGNOSIS — C3430 Malignant neoplasm of lower lobe, unspecified bronchus or lung: Secondary | ICD-10-CM

## 2014-03-31 DIAGNOSIS — C3490 Malignant neoplasm of unspecified part of unspecified bronchus or lung: Secondary | ICD-10-CM

## 2014-03-31 DIAGNOSIS — R197 Diarrhea, unspecified: Secondary | ICD-10-CM

## 2014-03-31 LAB — COMPREHENSIVE METABOLIC PANEL (CC13)
ALT: 40 U/L (ref 0–55)
AST: 32 U/L (ref 5–34)
Albumin: 3.7 g/dL (ref 3.5–5.0)
Alkaline Phosphatase: 95 U/L (ref 40–150)
Anion Gap: 10 mEq/L (ref 3–11)
BUN: 10.1 mg/dL (ref 7.0–26.0)
CO2: 28 mEq/L (ref 22–29)
Calcium: 9.7 mg/dL (ref 8.4–10.4)
Chloride: 105 mEq/L (ref 98–109)
Creatinine: 0.7 mg/dL (ref 0.6–1.1)
Glucose: 114 mg/dl (ref 70–140)
Potassium: 4.5 mEq/L (ref 3.5–5.1)
Sodium: 143 mEq/L (ref 136–145)
Total Bilirubin: 0.71 mg/dL (ref 0.20–1.20)
Total Protein: 7.6 g/dL (ref 6.4–8.3)

## 2014-03-31 LAB — CBC WITH DIFFERENTIAL/PLATELET
BASO%: 0.4 % (ref 0.0–2.0)
Basophils Absolute: 0 10*3/uL (ref 0.0–0.1)
EOS%: 1.7 % (ref 0.0–7.0)
Eosinophils Absolute: 0.1 10*3/uL (ref 0.0–0.5)
HCT: 40.1 % (ref 34.8–46.6)
HGB: 13.1 g/dL (ref 11.6–15.9)
LYMPH%: 18.6 % (ref 14.0–49.7)
MCH: 28.6 pg (ref 25.1–34.0)
MCHC: 32.6 g/dL (ref 31.5–36.0)
MCV: 87.8 fL (ref 79.5–101.0)
MONO#: 0.7 10*3/uL (ref 0.1–0.9)
MONO%: 10.1 % (ref 0.0–14.0)
NEUT#: 4.5 10*3/uL (ref 1.5–6.5)
NEUT%: 69.2 % (ref 38.4–76.8)
Platelets: 335 10*3/uL (ref 145–400)
RBC: 4.57 10*6/uL (ref 3.70–5.45)
RDW: 14.4 % (ref 11.2–14.5)
WBC: 6.5 10*3/uL (ref 3.9–10.3)
lymph#: 1.2 10*3/uL (ref 0.9–3.3)

## 2014-03-31 NOTE — Progress Notes (Signed)
[name]   [phone]  OFFICE PROGRESS NOTE  [name], MD [place] [address]  PRINCIPAL DIAGNOSIS: Local recurrence of non-small cell lung cancer, adenocarcinoma, initially diagnosed as stage IB (T2a N0 M0) in March of 2010.   PRIOR THERAPY:  1. Status post right lower lobectomy under the care of Dr. Cyndia Bent on 04/08/2009. 2. Status post palliative radiotherapy to the right lower lobe recurrent lung mass under the care of Dr. Lisbeth Renshaw, completed on June 08, 2011. 3. Systemic chemotherapy with carboplatin for AUC of 5 and Alimta 500 mg/M2 every 3 weeks. She is status post 3 cycles.  CURRENT THERAPY: Tarceva 150 mg by mouth daily, therapy beginning 07/24/2012. Status post approximately 18 months of therapy.   CHEMOTHERAPY INTENT: Palliative  CURRENT # OF CHEMOTHERAPY CYCLES: 19 CURRENT ANTIEMETICS: Compazine  CURRENT SMOKING STATUS: Nonsmoker  ORAL CHEMOTHERAPY AND CONSENT: Tarceva  CURRENT BISPHOSPHONATES USE: None  PAIN MANAGEMENT: None  NARCOTICS INDUCED CONSTIPATION: None  LIVING WILL AND CODE STATUS: Full code  INTERVAL HISTORY: [name] 53 y.o. female returns to the clinic today for follow up visit accompanied by her interpreter. The patient is feeling much better today with no specific complaints except for her chest congestion and flulike symptoms and started 3 days ago. She denied having any significant skin rash but has occasional diarrhea depending on the type of food she eats. She denied having any significant chest pain, shortness of breath, with occasional dry cough but no hemoptysis. The patient denied having any significant weight loss or night sweats. She denied having any nausea or vomiting , fever or chills.  MEDICAL HISTORY: Past Medical History  Diagnosis Date  . Lung cancer     right lower lobe adenocarcinoma  . History of radiation therapy 05/02/11 to 06/08/11    right lung     ALLERGIES:  is allergic to codeine and doxycycline.  MEDICATIONS:  Current Outpatient Prescriptions  Medication Sig Dispense Refill  . acetaminophen (TYLENOL) 325 MG tablet Take 650 mg by mouth every 6 (six) hours as needed. For pain      . albuterol (PROVENTIL HFA;VENTOLIN HFA) 108 (90 BASE) MCG/ACT inhaler Inhale 2 puffs into the lungs every 6 (six) hours as needed for wheezing.  1 Inhaler  2  . Aspirin-Salicylamide-Caffeine (BC HEADACHE POWDER PO) Take 1 packet by mouth as needed.      . chlorpheniramine-HYDROcodone (TUSSIONEX) 10-8 MG/5ML LQCR Take 5 mLs by mouth every 12 (twelve) hours as needed for cough.  240 mL  0  . clindamycin (CLEOCIN T) 1 % lotion Apply topically 2 (two) times daily.  60 mL  1  . erlotinib (TARCEVA) 150 MG tablet Take 1 tablet (150 mg total) by mouth daily.  30 tablet  0  . fluticasone (CUTIVATE) 0.05 % cream       . HYDROcodone-homatropine (HYCODAN) 5-1.5 MG/5ML syrup Take 5 mLs by mouth every 6 (six) hours as needed for cough.  240 mL  0  . ibuprofen (ADVIL,MOTRIN) 100 MG tablet Take 200 mg by mouth every 6 (six) hours as needed. For pain      . lidocaine-prilocaine (EMLA) cream Apply to Port-A-Cath prior to tx PRN  5 g  prn 1 year  . Pseudoeph-Doxylamine-DM-APAP (NYQUIL PO) Take by mouth as needed.       No current facility-administered medications for this visit.    SURGICAL HISTORY:  Past Surgical History  Procedure Laterality Date  .  Lung lobectomy  04/18/2009    RLL  . Portacath placement  02/29/2012    Procedure: INSERTION PORT-A-CATH;  Surgeon: Nicanor Alcon, MD;  Location: [facility];  Service: Thoracic;  Laterality: Left;  PowerPort 8 F attachable in left internal jugular.    REVIEW OF SYSTEMS:  Constitutional: negative Eyes: negative Ears, nose, mouth, throat, and face: negative Respiratory: negative Cardiovascular: negative Gastrointestinal: negative Genitourinary:negative Integument/breast: negative Hematologic/lymphatic:  negative Musculoskeletal:negative Neurological: negative Behavioral/Psych: negative Endocrine: negative Allergic/Immunologic: negative   PHYSICAL EXAMINATION: General appearance: alert, cooperative and no distress Head: Normocephalic, without obvious abnormality, atraumatic Neck: no adenopathy, no JVD, supple, symmetrical, trachea midline and thyroid not enlarged, symmetric, no tenderness/mass/nodules Lymph nodes: Cervical, supraclavicular, and axillary nodes normal. Resp: wheezes bilaterally Back: symmetric, no curvature. ROM normal. No CVA tenderness. Cardio: regular rate and rhythm, S1, S2 normal, no murmur, click, rub or gallop GI: soft, non-tender; bowel sounds normal; no masses,  no organomegaly Extremities: extremities normal, atraumatic, no cyanosis or edema Neurologic: Alert and oriented X 3, normal strength and tone. Normal symmetric reflexes. Normal coordination and gait  ECOG PERFORMANCE STATUS: 1 - Symptomatic but completely ambulatory  Blood pressure 125/80, pulse 93, temperature 97.8 F (36.6 C), temperature source Oral, resp. rate 18, height 5' (1.524 m), weight 128 lb 8 oz (58.287 kg), SpO2 98.00%.  LABORATORY DATA: Lab Results  Component Value Date   WBC 6.5 03/31/2014   HGB 13.1 03/31/2014   HCT 40.1 03/31/2014   MCV 87.8 03/31/2014   PLT 335 03/31/2014      Chemistry      Component Value Date/Time   NA 142 03/03/2014 0843   NA 137 07/12/2012 1121   NA 141 01/06/2012 0804   K 4.8 03/03/2014 0843   K 3.9 07/12/2012 1121   K 4.6 01/06/2012 0804   CL 102 04/30/2013 1002   CL 104 07/12/2012 1121   CL 98 01/06/2012 0804   CO2 28 03/03/2014 0843   CO2 24 07/12/2012 1121   CO2 28 01/06/2012 0804   BUN 16.5 03/03/2014 0843   BUN 8 07/12/2012 1121   BUN 10 01/06/2012 0804   CREATININE 0.6 03/03/2014 0843   CREATININE 0.45* 07/12/2012 1121   CREATININE 0.6 01/06/2012 0804      Component Value Date/Time   CALCIUM 9.6 03/03/2014 0843   CALCIUM 8.6 07/12/2012 1121   CALCIUM 8.9 01/06/2012 0804    ALKPHOS 110 03/03/2014 0843   ALKPHOS 93 07/12/2012 1121   ALKPHOS 64 01/06/2012 0804   AST 51* 03/03/2014 0843   AST 44* 07/12/2012 1121   AST 27 01/06/2012 0804   ALT 57* 03/03/2014 0843   ALT 49* 07/12/2012 1121   ALT 24 01/06/2012 0804   BILITOT 0.48 03/03/2014 0843   BILITOT 0.3 07/12/2012 1121   BILITOT 0.60 01/06/2012 0804       RADIOGRAPHIC STUDIES:  ASSESSMENT AND PLAN: This is a very pleasant 53 years old Asian female with recurrent non-small cell lung cancer, adenocarcinoma. She is currently on treatment with oral Tarceva 150 mg by mouth daily status post 18 months of treatment. She is tolerating her treatment with Tarceva fairly well.  I recommended for the patient to continue with the current treatment. For diarrhea, she will continue on Imodium on as-needed basis. Physical symptoms she will continue on over the counter cold medication and allergy meds. She would come back for follow up visit in one month with repeat CBC and comprehensive metabolic panel.   She was advised to call  immediately if she has any concerning symptoms in the interval.  The patient voices understanding of current disease status and treatment options and is in agreement with the current care plan.  All questions were answered. The patient knows to call the clinic with any problems, questions or concerns. We can certainly see the patient much sooner if necessary.  Disclaimer: This note was dictated with voice recognition software. Similar sounding words can inadvertently be transcribed and may not be corrected upon review.

## 2014-03-31 NOTE — Telephone Encounter (Signed)
Gave pt appt for lab and Md for june 2015

## 2014-04-30 ENCOUNTER — Other Ambulatory Visit: Payer: Self-pay | Admitting: Medical Oncology

## 2014-04-30 DIAGNOSIS — C3490 Malignant neoplasm of unspecified part of unspecified bronchus or lung: Secondary | ICD-10-CM

## 2014-04-30 MED ORDER — ERLOTINIB HCL 150 MG PO TABS: 150.0000 mg | ORAL_TABLET | Freq: Every day | ORAL | Status: DC

## 2014-04-30 NOTE — Telephone Encounter (Signed)
tarceva refill sent to [facility].

## 2014-05-01 ENCOUNTER — Ambulatory Visit: Payer: 59 | Admitting: Internal Medicine

## 2014-05-05 ENCOUNTER — Other Ambulatory Visit (HOSPITAL_BASED_OUTPATIENT_CLINIC_OR_DEPARTMENT_OTHER): Payer: 59

## 2014-05-05 ENCOUNTER — Telehealth: Payer: Self-pay | Admitting: Internal Medicine

## 2014-05-05 ENCOUNTER — Ambulatory Visit (HOSPITAL_BASED_OUTPATIENT_CLINIC_OR_DEPARTMENT_OTHER): Payer: 59 | Admitting: Internal Medicine

## 2014-05-05 ENCOUNTER — Encounter: Payer: Self-pay | Admitting: Internal Medicine

## 2014-05-05 ENCOUNTER — Ambulatory Visit: Payer: 59

## 2014-05-05 VITALS — BP 127/85 | HR 88 | Temp 98.2°F | Resp 18 | Ht 60.0 in | Wt 129.9 lb

## 2014-05-05 DIAGNOSIS — C3490 Malignant neoplasm of unspecified part of unspecified bronchus or lung: Secondary | ICD-10-CM

## 2014-05-05 DIAGNOSIS — R05 Cough: Secondary | ICD-10-CM

## 2014-05-05 DIAGNOSIS — R059 Cough, unspecified: Secondary | ICD-10-CM

## 2014-05-05 DIAGNOSIS — R197 Diarrhea, unspecified: Secondary | ICD-10-CM

## 2014-05-05 DIAGNOSIS — C3430 Malignant neoplasm of lower lobe, unspecified bronchus or lung: Secondary | ICD-10-CM

## 2014-05-05 DIAGNOSIS — Z95828 Presence of other vascular implants and grafts: Secondary | ICD-10-CM

## 2014-05-05 LAB — CBC WITH DIFFERENTIAL/PLATELET
BASO%: 0.4 % (ref 0.0–2.0)
Basophils Absolute: 0 10*3/uL (ref 0.0–0.1)
EOS%: 1.8 % (ref 0.0–7.0)
Eosinophils Absolute: 0.1 10*3/uL (ref 0.0–0.5)
HCT: 38.6 % (ref 34.8–46.6)
HGB: 12.6 g/dL (ref 11.6–15.9)
LYMPH%: 22.1 % (ref 14.0–49.7)
MCH: 28.1 pg (ref 25.1–34.0)
MCHC: 32.6 g/dL (ref 31.5–36.0)
MCV: 86.2 fL (ref 79.5–101.0)
MONO#: 0.5 10*3/uL (ref 0.1–0.9)
MONO%: 6.9 % (ref 0.0–14.0)
NEUT#: 5.3 10*3/uL (ref 1.5–6.5)
NEUT%: 68.8 % (ref 38.4–76.8)
Platelets: 326 10*3/uL (ref 145–400)
RBC: 4.48 10*6/uL (ref 3.70–5.45)
RDW: 13.6 % (ref 11.2–14.5)
WBC: 7.7 10*3/uL (ref 3.9–10.3)
lymph#: 1.7 10*3/uL (ref 0.9–3.3)

## 2014-05-05 LAB — COMPREHENSIVE METABOLIC PANEL (CC13)
ALT: 17 U/L (ref 0–55)
AST: 25 U/L (ref 5–34)
Albumin: 3.4 g/dL — ABNORMAL LOW (ref 3.5–5.0)
Alkaline Phosphatase: 76 U/L (ref 40–150)
Anion Gap: 12 mEq/L — ABNORMAL HIGH (ref 3–11)
BUN: 14.4 mg/dL (ref 7.0–26.0)
CO2: 25 mEq/L (ref 22–29)
Calcium: 8.6 mg/dL (ref 8.4–10.4)
Chloride: 104 mEq/L (ref 98–109)
Creatinine: 0.7 mg/dL (ref 0.6–1.1)
Glucose: 102 mg/dl (ref 70–140)
Potassium: 3.8 mEq/L (ref 3.5–5.1)
Sodium: 141 mEq/L (ref 136–145)
Total Bilirubin: 0.37 mg/dL (ref 0.20–1.20)
Total Protein: 7.3 g/dL (ref 6.4–8.3)

## 2014-05-05 MED ORDER — SODIUM CHLORIDE 0.9 % IJ SOLN
10.0000 mL | INTRAMUSCULAR | Status: DC | PRN
  Administered 2014-05-05: 10 mL via INTRAVENOUS
  Filled 2014-05-05: qty 10

## 2014-05-05 MED ORDER — HEPARIN SOD (PORK) LOCK FLUSH 100 UNIT/ML IV SOLN
500.0000 [IU] | Freq: Once | INTRAVENOUS | Status: AC
  Administered 2014-05-05: 500 [IU] via INTRAVENOUS
  Filled 2014-05-05: qty 5

## 2014-05-05 MED ORDER — HYDROCODONE-HOMATROPINE 5-1.5 MG/5ML PO SYRP: 5.0000 mL | ORAL_SOLUTION | Freq: Four times a day (QID) | ORAL | Status: DC | PRN

## 2014-05-05 NOTE — Progress Notes (Signed)
[facility] Telephone:([phone]   Fax:(336) [phone]  OFFICE PROGRESS NOTE  [name]., MD [place] [address]  PRINCIPAL DIAGNOSIS: Local recurrence of non-small cell lung cancer, adenocarcinoma, initially diagnosed as stage IB (T2a N0 M0) in March of 2010.   PRIOR THERAPY:  1. Status post right lower lobectomy under the care of Dr. Cyndia Bent on 04/08/2009. 2. Status post palliative radiotherapy to the right lower lobe recurrent lung mass under the care of Dr. Lisbeth Renshaw, completed on June 08, 2011. 3. Systemic chemotherapy with carboplatin for AUC of 5 and Alimta 500 mg/M2 every 3 weeks. She is status post 3 cycles.  CURRENT THERAPY: Tarceva 150 mg by mouth daily, therapy beginning 07/24/2012. Status post approximately 19 months of therapy.   CHEMOTHERAPY INTENT: Palliative  CURRENT # OF CHEMOTHERAPY CYCLES: 20 CURRENT ANTIEMETICS: Compazine  CURRENT SMOKING STATUS: Nonsmoker  ORAL CHEMOTHERAPY AND CONSENT: Tarceva  CURRENT BISPHOSPHONATES USE: None  PAIN MANAGEMENT: None  NARCOTICS INDUCED CONSTIPATION: None  LIVING WILL AND CODE STATUS: Full code  INTERVAL HISTORY: [name] 53 y.o. female returns to the clinic today for follow up visit accompanied by her interpreter. She is feeling good today except for persistent dry cough but comes in episodes improved with Hycodan. She denied having any significant skin rash but has occasional diarrhea depending on the type of food she eats. She denied having any significant chest pain, shortness of breath,  no hemoptysis. The patient denied having any significant weight loss or night sweats. She denied having any nausea or vomiting , fever or chills.  MEDICAL HISTORY: Past Medical History  Diagnosis Date  . Lung cancer     right lower lobe adenocarcinoma  . History of radiation therapy 05/02/11 to 06/08/11    right lung    ALLERGIES:  is allergic to codeine and doxycycline.  MEDICATIONS:    Current Outpatient Prescriptions  Medication Sig Dispense Refill  . acetaminophen (TYLENOL) 325 MG tablet Take 650 mg by mouth every 6 (six) hours as needed. For pain      . albuterol (PROVENTIL HFA;VENTOLIN HFA) 108 (90 BASE) MCG/ACT inhaler Inhale 2 puffs into the lungs every 6 (six) hours as needed for wheezing.  1 Inhaler  2  . Aspirin-Salicylamide-Caffeine (BC HEADACHE POWDER PO) Take 1 packet by mouth as needed.      . chlorpheniramine-HYDROcodone (TUSSIONEX) 10-8 MG/5ML LQCR Take 5 mLs by mouth every 12 (twelve) hours as needed for cough.  240 mL  0  . clindamycin (CLEOCIN T) 1 % lotion Apply topically 2 (two) times daily.  60 mL  1  . erlotinib (TARCEVA) 150 MG tablet Take 1 tablet (150 mg total) by mouth daily.  30 tablet  0  . fluticasone (CUTIVATE) 0.05 % cream       . HYDROcodone-homatropine (HYCODAN) 5-1.5 MG/5ML syrup Take 5 mLs by mouth every 6 (six) hours as needed for cough.  240 mL  0  . ibuprofen (ADVIL,MOTRIN) 100 MG tablet Take 200 mg by mouth every 6 (six) hours as needed. For pain      . lidocaine-prilocaine (EMLA) cream Apply to Port-A-Cath prior to tx PRN  5 g  prn 1 year  . Pseudoeph-Doxylamine-DM-APAP (NYQUIL PO) Take by mouth as needed.       No current facility-administered medications for this visit.    SURGICAL HISTORY:  Past Surgical History  Procedure Laterality Date  . Lung lobectomy  04/18/2009    RLL  .  Portacath placement  02/29/2012    Procedure: INSERTION PORT-A-CATH;  Surgeon: Nicanor Alcon, MD;  Location: [facility];  Service: Thoracic;  Laterality: Left;  PowerPort 8 F attachable in left internal jugular.    REVIEW OF SYSTEMS:  Constitutional: negative Eyes: negative Ears, nose, mouth, throat, and face: negative Respiratory: negative Cardiovascular: negative Gastrointestinal: negative Genitourinary:negative Integument/breast: negative Hematologic/lymphatic: negative Musculoskeletal:negative Neurological: negative Behavioral/Psych:  negative Endocrine: negative Allergic/Immunologic: negative   PHYSICAL EXAMINATION: General appearance: alert, cooperative and no distress Head: Normocephalic, without obvious abnormality, atraumatic Neck: no adenopathy, no JVD, supple, symmetrical, trachea midline and thyroid not enlarged, symmetric, no tenderness/mass/nodules Lymph nodes: Cervical, supraclavicular, and axillary nodes normal. Resp: wheezes bilaterally Back: symmetric, no curvature. ROM normal. No CVA tenderness. Cardio: regular rate and rhythm, S1, S2 normal, no murmur, click, rub or gallop GI: soft, non-tender; bowel sounds normal; no masses,  no organomegaly Extremities: extremities normal, atraumatic, no cyanosis or edema Neurologic: Alert and oriented X 3, normal strength and tone. Normal symmetric reflexes. Normal coordination and gait  ECOG PERFORMANCE STATUS: 1 - Symptomatic but completely ambulatory  Blood pressure 127/85, pulse 88, temperature 98.2 F (36.8 C), temperature source Oral, resp. rate 18, height 5' (1.524 m), weight 129 lb 14.4 oz (58.922 kg).  LABORATORY DATA: Lab Results  Component Value Date   WBC 7.7 05/05/2014   HGB 12.6 05/05/2014   HCT 38.6 05/05/2014   MCV 86.2 05/05/2014   PLT 326 05/05/2014      Chemistry      Component Value Date/Time   NA 143 03/31/2014 1030   NA 137 07/12/2012 1121   NA 141 01/06/2012 0804   K 4.5 03/31/2014 1030   K 3.9 07/12/2012 1121   K 4.6 01/06/2012 0804   CL 102 04/30/2013 1002   CL 104 07/12/2012 1121   CL 98 01/06/2012 0804   CO2 28 03/31/2014 1030   CO2 24 07/12/2012 1121   CO2 28 01/06/2012 0804   BUN 10.1 03/31/2014 1030   BUN 8 07/12/2012 1121   BUN 10 01/06/2012 0804   CREATININE 0.7 03/31/2014 1030   CREATININE 0.45* 07/12/2012 1121   CREATININE 0.6 01/06/2012 0804      Component Value Date/Time   CALCIUM 9.7 03/31/2014 1030   CALCIUM 8.6 07/12/2012 1121   CALCIUM 8.9 01/06/2012 0804   ALKPHOS 95 03/31/2014 1030   ALKPHOS 93 07/12/2012 1121   ALKPHOS 64 01/06/2012 0804   AST  32 03/31/2014 1030   AST 44* 07/12/2012 1121   AST 27 01/06/2012 0804   ALT 40 03/31/2014 1030   ALT 49* 07/12/2012 1121   ALT 24 01/06/2012 0804   BILITOT 0.71 03/31/2014 1030   BILITOT 0.3 07/12/2012 1121   BILITOT 0.60 01/06/2012 0804       RADIOGRAPHIC STUDIES:  ASSESSMENT AND PLAN: This is a very pleasant 53 years old Asian female with recurrent non-small cell lung cancer, adenocarcinoma. She is currently on treatment with oral Tarceva 150 mg by mouth daily status post 19 months of treatment. She is tolerating her treatment with Tarceva fairly well.  I recommended for the patient to continue with the current treatment. For cough she was given a refill of Hycodan. For diarrhea, she will continue on Imodium on as-needed basis. I will see her back for followup visit in one month after repeating CT scan of the chest for restaging of her disease She was advised to call immediately if she has any concerning symptoms in the interval.  The patient voices  understanding of current disease status and treatment options and is in agreement with the current care plan.  All questions were answered. The patient knows to call the clinic with any problems, questions or concerns. We can certainly see the patient much sooner if necessary.  Disclaimer: This note was dictated with voice recognition software. Similar sounding words can inadvertently be transcribed and may not be corrected upon review.

## 2014-05-05 NOTE — Telephone Encounter (Signed)
gv adn printed appt sched and avs for pt for July  °

## 2014-05-05 NOTE — Patient Instructions (Signed)

## 2014-05-16 ENCOUNTER — Other Ambulatory Visit: Payer: Self-pay | Admitting: Medical Oncology

## 2014-05-16 DIAGNOSIS — C3491 Malignant neoplasm of unspecified part of right bronchus or lung: Secondary | ICD-10-CM

## 2014-05-16 MED ORDER — HYDROCOD POLST-CHLORPHEN POLST 10-8 MG/5ML PO LQCR: 5.0000 mL | Freq: Two times a day (BID) | ORAL | Status: DC | PRN

## 2014-05-16 NOTE — Telephone Encounter (Signed)
Son called and reported pt received and filled rx last week for hycodan . When pt picked up rx she read label and did not open it because she said it does not work as good as tussionex that she got back in march. Son requests refill of tussionex. Refill sent to [facility] for authorization.

## 2014-05-21 ENCOUNTER — Other Ambulatory Visit: Payer: Self-pay | Admitting: *Deleted

## 2014-05-21 DIAGNOSIS — C3491 Malignant neoplasm of unspecified part of right bronchus or lung: Secondary | ICD-10-CM

## 2014-05-21 MED ORDER — HYDROCOD POLST-CHLORPHEN POLST 10-8 MG/5ML PO LQCR: 5.0000 mL | Freq: Two times a day (BID) | ORAL | Status: DC | PRN

## 2014-06-04 ENCOUNTER — Other Ambulatory Visit (HOSPITAL_BASED_OUTPATIENT_CLINIC_OR_DEPARTMENT_OTHER): Payer: 59

## 2014-06-04 ENCOUNTER — Ambulatory Visit (HOSPITAL_COMMUNITY)
Admission: RE | Admit: 2014-06-04 | Discharge: 2014-06-04 | Disposition: A | Discharge status: Home / Self Care | Payer: 59 | Source: Ambulatory Visit | Attending: Internal Medicine | Admitting: Internal Medicine

## 2014-06-04 ENCOUNTER — Encounter (HOSPITAL_COMMUNITY): Payer: Self-pay

## 2014-06-04 DIAGNOSIS — C3430 Malignant neoplasm of lower lobe, unspecified bronchus or lung: Secondary | ICD-10-CM

## 2014-06-04 DIAGNOSIS — C3490 Malignant neoplasm of unspecified part of unspecified bronchus or lung: Secondary | ICD-10-CM | POA: Insufficient documentation

## 2014-06-04 DIAGNOSIS — Z9221 Personal history of antineoplastic chemotherapy: Secondary | ICD-10-CM | POA: Insufficient documentation

## 2014-06-04 DIAGNOSIS — Z902 Acquired absence of lung [part of]: Secondary | ICD-10-CM | POA: Insufficient documentation

## 2014-06-04 LAB — CBC WITH DIFFERENTIAL/PLATELET
BASO%: 0.4 % (ref 0.0–2.0)
Basophils Absolute: 0 10*3/uL (ref 0.0–0.1)
EOS%: 1.2 % (ref 0.0–7.0)
Eosinophils Absolute: 0.1 10*3/uL (ref 0.0–0.5)
HCT: 43.3 % (ref 34.8–46.6)
HGB: 14.1 g/dL (ref 11.6–15.9)
LYMPH%: 20.1 % (ref 14.0–49.7)
MCH: 27.8 pg (ref 25.1–34.0)
MCHC: 32.6 g/dL (ref 31.5–36.0)
MCV: 85.2 fL (ref 79.5–101.0)
MONO#: 0.6 10*3/uL (ref 0.1–0.9)
MONO%: 6.6 % (ref 0.0–14.0)
NEUT#: 6.1 10*3/uL (ref 1.5–6.5)
NEUT%: 71.7 % (ref 38.4–76.8)
Platelets: 352 10*3/uL (ref 145–400)
RBC: 5.09 10*6/uL (ref 3.70–5.45)
RDW: 13.5 % (ref 11.2–14.5)
WBC: 8.5 10*3/uL (ref 3.9–10.3)
lymph#: 1.7 10*3/uL (ref 0.9–3.3)

## 2014-06-04 LAB — COMPREHENSIVE METABOLIC PANEL (CC13)
ALT: 36 U/L (ref 0–55)
AST: 28 U/L (ref 5–34)
Albumin: 3.9 g/dL (ref 3.5–5.0)
Alkaline Phosphatase: 96 U/L (ref 40–150)
Anion Gap: 10 mEq/L (ref 3–11)
BUN: 8.2 mg/dL (ref 7.0–26.0)
CO2: 30 mEq/L — ABNORMAL HIGH (ref 22–29)
Calcium: 9.8 mg/dL (ref 8.4–10.4)
Chloride: 101 mEq/L (ref 98–109)
Creatinine: 0.7 mg/dL (ref 0.6–1.1)
Glucose: 102 mg/dl (ref 70–140)
Potassium: 4 mEq/L (ref 3.5–5.1)
Sodium: 140 mEq/L (ref 136–145)
Total Bilirubin: 0.83 mg/dL (ref 0.20–1.20)
Total Protein: 7.9 g/dL (ref 6.4–8.3)

## 2014-06-04 MED ORDER — IOHEXOL 300 MG/ML  SOLN
80.0000 mL | Freq: Once | INTRAMUSCULAR | Status: AC | PRN
  Administered 2014-06-04: 80 mL via INTRAVENOUS

## 2014-06-09 ENCOUNTER — Ambulatory Visit (HOSPITAL_BASED_OUTPATIENT_CLINIC_OR_DEPARTMENT_OTHER): Payer: 59 | Admitting: Internal Medicine

## 2014-06-09 ENCOUNTER — Telehealth: Payer: Self-pay | Admitting: Internal Medicine

## 2014-06-09 ENCOUNTER — Encounter: Payer: Self-pay | Admitting: Internal Medicine

## 2014-06-09 VITALS — BP 126/78 | HR 101 | Temp 98.3°F | Resp 19 | Ht 60.0 in | Wt 128.0 lb

## 2014-06-09 DIAGNOSIS — C3491 Malignant neoplasm of unspecified part of right bronchus or lung: Secondary | ICD-10-CM

## 2014-06-09 DIAGNOSIS — C3430 Malignant neoplasm of lower lobe, unspecified bronchus or lung: Secondary | ICD-10-CM

## 2014-06-09 DIAGNOSIS — R197 Diarrhea, unspecified: Secondary | ICD-10-CM

## 2014-06-09 DIAGNOSIS — R059 Cough, unspecified: Secondary | ICD-10-CM

## 2014-06-09 DIAGNOSIS — R05 Cough: Secondary | ICD-10-CM

## 2014-06-09 MED ORDER — HYDROCOD POLST-CHLORPHEN POLST 10-8 MG/5ML PO LQCR: 5.0000 mL | Freq: Two times a day (BID) | ORAL | Status: DC | PRN

## 2014-06-09 MED ORDER — ERLOTINIB HCL 150 MG PO TABS: 150.0000 mg | ORAL_TABLET | Freq: Every day | ORAL | Status: DC

## 2014-06-09 MED ORDER — CLINDAMYCIN PHOSPHATE 1 % EX LOTN: TOPICAL_LOTION | Freq: Two times a day (BID) | CUTANEOUS | Status: DC

## 2014-06-09 NOTE — Telephone Encounter (Signed)
gv adn printed appt sched anda vs for pt for Aug °

## 2014-06-09 NOTE — Progress Notes (Signed)
[place] Telephone:([phone]   Fax:(336) [phone]  OFFICE PROGRESS NOTE  [name]., MD [place] [address]  PRINCIPAL DIAGNOSIS: Local recurrence of non-small cell lung cancer, adenocarcinoma, initially diagnosed as stage IB (T2a N0 M0) in March of 2010.   PRIOR THERAPY:  1. Status post right lower lobectomy under the care of Dr. Cyndia Bent on 04/08/2009. 2. Status post palliative radiotherapy to the right lower lobe recurrent lung mass under the care of Dr. Lisbeth Renshaw, completed on June 08, 2011. 3. Systemic chemotherapy with carboplatin for AUC of 5 and Alimta 500 mg/M2 every 3 weeks. She is status post 3 cycles.  CURRENT THERAPY: Tarceva 150 mg by mouth daily, therapy beginning 07/24/2012. Status post approximately 20 months of therapy.   CHEMOTHERAPY INTENT: Palliative  CURRENT # OF CHEMOTHERAPY CYCLES: 21 CURRENT ANTIEMETICS: Compazine  CURRENT SMOKING STATUS: Nonsmoker  ORAL CHEMOTHERAPY AND CONSENT: Tarceva  CURRENT BISPHOSPHONATES USE: None  PAIN MANAGEMENT: None  NARCOTICS INDUCED CONSTIPATION: None  LIVING WILL AND CODE STATUS: Full code  INTERVAL HISTORY: [name] 53 y.o. female returns to the clinic today for follow up visit accompanied by her interpreter. She is feeling good today except for persistent dry cough but comes in episodes improved with Hycodan and she is requesting a refill of this medication. She denied having any significant skin rash but has occasional diarrhea depending on the type of food she eats. She denied having any significant chest pain, shortness of breath,  no hemoptysis. The patient denied having any significant weight loss or night sweats. She denied having any nausea or vomiting , fever or chills. She had repeat CT scan of the chest performed recently and she is here for evaluation and discussion of her scan results.  MEDICAL HISTORY: Past Medical History  Diagnosis Date  . Lung cancer    right lower lobe adenocarcinoma  . History of radiation therapy 05/02/11 to 06/08/11    right lung    ALLERGIES:  is allergic to codeine and doxycycline.  MEDICATIONS:  Current Outpatient Prescriptions  Medication Sig Dispense Refill  . Aspirin-Salicylamide-Caffeine (BC HEADACHE POWDER PO) Take 1 packet by mouth as needed.      . chlorpheniramine-HYDROcodone (TUSSIONEX) 10-8 MG/5ML LQCR Take 5 mLs by mouth every 12 (twelve) hours as needed for cough.  115 mL  0  . clindamycin (CLEOCIN T) 1 % lotion Apply topically 2 (two) times daily.  60 mL  1  . erlotinib (TARCEVA) 150 MG tablet Take 1 tablet (150 mg total) by mouth daily.  30 tablet  0  . fluticasone (CUTIVATE) 0.05 % cream       . lidocaine-prilocaine (EMLA) cream Apply to Port-A-Cath prior to tx PRN  5 g  prn 1 year  . acetaminophen (TYLENOL) 325 MG tablet Take 650 mg by mouth every 6 (six) hours as needed. For pain      . albuterol (PROVENTIL HFA;VENTOLIN HFA) 108 (90 BASE) MCG/ACT inhaler Inhale 2 puffs into the lungs every 6 (six) hours as needed for wheezing.  1 Inhaler  2   No current facility-administered medications for this visit.    SURGICAL HISTORY:  Past Surgical History  Procedure Laterality Date  . Lung lobectomy  04/18/2009    RLL  . Portacath placement  02/29/2012    Procedure: INSERTION PORT-A-CATH;  Surgeon: Nicanor Alcon, MD;  Location: [facility];  Service: Thoracic;  Laterality: Left;  PowerPort 8 F attachable in left internal  jugular.    REVIEW OF SYSTEMS:  Constitutional: negative Eyes: negative Ears, nose, mouth, throat, and face: negative Respiratory: negative Cardiovascular: negative Gastrointestinal: negative Genitourinary:negative Integument/breast: negative Hematologic/lymphatic: negative Musculoskeletal:negative Neurological: negative Behavioral/Psych: negative Endocrine: negative Allergic/Immunologic: negative   PHYSICAL EXAMINATION: General appearance: alert, cooperative and no distress Head:  Normocephalic, without obvious abnormality, atraumatic Neck: no adenopathy, no JVD, supple, symmetrical, trachea midline and thyroid not enlarged, symmetric, no tenderness/mass/nodules Lymph nodes: Cervical, supraclavicular, and axillary nodes normal. Resp: wheezes bilaterally Back: symmetric, no curvature. ROM normal. No CVA tenderness. Cardio: regular rate and rhythm, S1, S2 normal, no murmur, click, rub or gallop GI: soft, non-tender; bowel sounds normal; no masses,  no organomegaly Extremities: extremities normal, atraumatic, no cyanosis or edema Neurologic: Alert and oriented X 3, normal strength and tone. Normal symmetric reflexes. Normal coordination and gait  ECOG PERFORMANCE STATUS: 1 - Symptomatic but completely ambulatory  Blood pressure 126/78, pulse 101, temperature 98.3 F (36.8 C), temperature source Oral, resp. rate 19, height 5' (1.524 m), weight 128 lb (58.06 kg).  LABORATORY DATA: Lab Results  Component Value Date   WBC 8.5 06/04/2014   HGB 14.1 06/04/2014   HCT 43.3 06/04/2014   MCV 85.2 06/04/2014   PLT 352 06/04/2014      Chemistry      Component Value Date/Time   NA 140 06/04/2014 1039   NA 137 07/12/2012 1121   NA 141 01/06/2012 0804   K 4.0 06/04/2014 1039   K 3.9 07/12/2012 1121   K 4.6 01/06/2012 0804   CL 102 04/30/2013 1002   CL 104 07/12/2012 1121   CL 98 01/06/2012 0804   CO2 30* 06/04/2014 1039   CO2 24 07/12/2012 1121   CO2 28 01/06/2012 0804   BUN 8.2 06/04/2014 1039   BUN 8 07/12/2012 1121   BUN 10 01/06/2012 0804   CREATININE 0.7 06/04/2014 1039   CREATININE 0.45* 07/12/2012 1121   CREATININE 0.6 01/06/2012 0804      Component Value Date/Time   CALCIUM 9.8 06/04/2014 1039   CALCIUM 8.6 07/12/2012 1121   CALCIUM 8.9 01/06/2012 0804   ALKPHOS 96 06/04/2014 1039   ALKPHOS 93 07/12/2012 1121   ALKPHOS 64 01/06/2012 0804   AST 28 06/04/2014 1039   AST 44* 07/12/2012 1121   AST 27 01/06/2012 0804   ALT 36 06/04/2014 1039   ALT 49* 07/12/2012 1121   ALT 24 01/06/2012 0804   BILITOT 0.83  06/04/2014 1039   BILITOT 0.3 07/12/2012 1121   BILITOT 0.60 01/06/2012 0804       RADIOGRAPHIC STUDIES: Ct Chest W Contrast  06/04/2014   CLINICAL DATA:  Lung cancer, status post partial right lobectomy, chemotherapy, and XRT  EXAM: CT CHEST WITH CONTRAST  TECHNIQUE: Multidetector CT imaging of the chest was performed during intravenous contrast administration.  CONTRAST:  22mL OMNIPAQUE IOHEXOL 300 MG/ML  SOLN  COMPARISON:  02/24/2014  FINDINGS: Posterior right upper lobe wedge resection. Associated radiation changes in the right paramediastinal region/posterior right upper lobe.  8 x 20 mm elongated nodular opacity adjacent to the suture line in the posterior right upper lobe (when measured in a similar fashion to the prior), unchanged.  Small to moderate right pleural effusion, unchanged. Left lung is clear. No new pulmonary nodules. No pneumothorax.  Visualized thyroid is unremarkable.  Cardiomegaly.  No pericardial effusion.  Left chest port terminating at the cavoatrial junction.  Visualized upper abdomen is notable for mildly heterogeneous perfusion of the liver.  Mild degenerative changes  of the visualized thoracolumbar spine.  IMPRESSION: Posterior right upper lobe wedge resection.  Associated radiation changes in the posterior right upper lobe/paramediastinal lung.  Stable 8 x 20 mm elongated nodular opacity adjacent to the suture line in the posterior right upper lobe.  No evidence of new/progressive metastatic disease in the chest.   Electronically Signed   By: Julian Hy M.D.   On: 06/04/2014 15:13   ASSESSMENT AND PLAN: This is a very pleasant 53 years old Asian female with recurrent non-small cell lung cancer, adenocarcinoma. She is currently on treatment with oral Tarceva 150 mg by mouth daily status post 20 months of treatment. She is tolerating her treatment with Tarceva fairly well.  Her recent scan showed no evidence for disease progression. I discussed the scan results with the  patient today. I recommended for the patient to continue with the current treatment. For cough she was given a refill of Hycodan. For diarrhea, she will continue on Imodium on as-needed basis. I will see her back for followup visit in one month with repeat CBC and comprehensive metabolic panel. She was advised to call immediately if she has any concerning symptoms in the interval.  The patient voices understanding of current disease status and treatment options and is in agreement with the current care plan.  All questions were answered. The patient knows to call the clinic with any problems, questions or concerns. We can certainly see the patient much sooner if necessary.  Disclaimer: This note was dictated with voice recognition software. Similar sounding words can inadvertently be transcribed and may not be corrected upon review.

## 2014-06-27 ENCOUNTER — Other Ambulatory Visit: Payer: Self-pay | Admitting: Medical Oncology

## 2014-06-27 ENCOUNTER — Telehealth: Payer: Self-pay | Admitting: Medical Oncology

## 2014-06-27 DIAGNOSIS — C3491 Malignant neoplasm of unspecified part of right bronchus or lung: Secondary | ICD-10-CM

## 2014-06-27 MED ORDER — HYDROCOD POLST-CHLORPHEN POLST 10-8 MG/5ML PO LQCR: 5.0000 mL | Freq: Two times a day (BID) | ORAL | Status: DC | PRN

## 2014-06-27 NOTE — Telephone Encounter (Signed)
Pts son died and during this time she lost her rx for cough syrup so she needs another rx.

## 2014-06-27 NOTE — Telephone Encounter (Signed)
I called pharmacy and last rx for tussionex filled 05/25/14 . rx left up front for pick up.

## 2014-07-14 ENCOUNTER — Telehealth: Payer: Self-pay | Admitting: Internal Medicine

## 2014-07-14 ENCOUNTER — Encounter: Payer: Self-pay | Admitting: Internal Medicine

## 2014-07-14 ENCOUNTER — Ambulatory Visit (HOSPITAL_BASED_OUTPATIENT_CLINIC_OR_DEPARTMENT_OTHER): Payer: 59

## 2014-07-14 ENCOUNTER — Ambulatory Visit (HOSPITAL_BASED_OUTPATIENT_CLINIC_OR_DEPARTMENT_OTHER): Payer: 59 | Admitting: Internal Medicine

## 2014-07-14 ENCOUNTER — Other Ambulatory Visit (HOSPITAL_BASED_OUTPATIENT_CLINIC_OR_DEPARTMENT_OTHER): Payer: 59

## 2014-07-14 VITALS — BP 131/84 | HR 85 | Temp 98.2°F | Resp 18 | Ht 60.0 in | Wt 128.7 lb

## 2014-07-14 DIAGNOSIS — C3491 Malignant neoplasm of unspecified part of right bronchus or lung: Secondary | ICD-10-CM

## 2014-07-14 DIAGNOSIS — J3489 Other specified disorders of nose and nasal sinuses: Secondary | ICD-10-CM

## 2014-07-14 DIAGNOSIS — Z95828 Presence of other vascular implants and grafts: Secondary | ICD-10-CM

## 2014-07-14 DIAGNOSIS — C3430 Malignant neoplasm of lower lobe, unspecified bronchus or lung: Secondary | ICD-10-CM

## 2014-07-14 DIAGNOSIS — C3490 Malignant neoplasm of unspecified part of unspecified bronchus or lung: Secondary | ICD-10-CM

## 2014-07-14 DIAGNOSIS — R197 Diarrhea, unspecified: Secondary | ICD-10-CM

## 2014-07-14 LAB — COMPREHENSIVE METABOLIC PANEL (CC13)
ALT: 24 U/L (ref 0–55)
AST: 27 U/L (ref 5–34)
Albumin: 3.6 g/dL (ref 3.5–5.0)
Alkaline Phosphatase: 78 U/L (ref 40–150)
Anion Gap: 9 mEq/L (ref 3–11)
BUN: 12.3 mg/dL (ref 7.0–26.0)
CO2: 29 mEq/L (ref 22–29)
Calcium: 9.2 mg/dL (ref 8.4–10.4)
Chloride: 103 mEq/L (ref 98–109)
Creatinine: 0.6 mg/dL (ref 0.6–1.1)
Glucose: 98 mg/dl (ref 70–140)
Potassium: 3.8 mEq/L (ref 3.5–5.1)
Sodium: 140 mEq/L (ref 136–145)
Total Bilirubin: 0.57 mg/dL (ref 0.20–1.20)
Total Protein: 7.3 g/dL (ref 6.4–8.3)

## 2014-07-14 LAB — CBC WITH DIFFERENTIAL/PLATELET
BASO%: 0.3 % (ref 0.0–2.0)
Basophils Absolute: 0 10*3/uL (ref 0.0–0.1)
EOS%: 1.1 % (ref 0.0–7.0)
Eosinophils Absolute: 0.1 10*3/uL (ref 0.0–0.5)
HCT: 39.8 % (ref 34.8–46.6)
HGB: 12.6 g/dL (ref 11.6–15.9)
LYMPH%: 22 % (ref 14.0–49.7)
MCH: 27.3 pg (ref 25.1–34.0)
MCHC: 31.7 g/dL (ref 31.5–36.0)
MCV: 86.1 fL (ref 79.5–101.0)
MONO#: 0.6 10*3/uL (ref 0.1–0.9)
MONO%: 8.1 % (ref 0.0–14.0)
NEUT#: 5 10*3/uL (ref 1.5–6.5)
NEUT%: 68.5 % (ref 38.4–76.8)
Platelets: 288 10*3/uL (ref 145–400)
RBC: 4.62 10*6/uL (ref 3.70–5.45)
RDW: 14.4 % (ref 11.2–14.5)
WBC: 7.3 10*3/uL (ref 3.9–10.3)
lymph#: 1.6 10*3/uL (ref 0.9–3.3)

## 2014-07-14 MED ORDER — HEPARIN SOD (PORK) LOCK FLUSH 100 UNIT/ML IV SOLN
500.0000 [IU] | Freq: Once | INTRAVENOUS | Status: AC
  Administered 2014-07-14: 500 [IU] via INTRAVENOUS
  Filled 2014-07-14: qty 5

## 2014-07-14 MED ORDER — SODIUM CHLORIDE 0.9 % IJ SOLN
10.0000 mL | INTRAMUSCULAR | Status: DC | PRN
  Administered 2014-07-14: 10 mL via INTRAVENOUS
  Filled 2014-07-14: qty 10

## 2014-07-14 NOTE — Telephone Encounter (Signed)
gv and printed appt schedand avs for pt for Sept °

## 2014-07-14 NOTE — Progress Notes (Signed)
[place] Telephone:(336) [phone]   Fax:(336) [phone]  OFFICE PROGRESS NOTE  [name], MD [place] [address]  PRINCIPAL DIAGNOSIS: Local recurrence of non-small cell lung cancer, adenocarcinoma, initially diagnosed as stage IB (T2a N0 M0) in March of 2010.   PRIOR THERAPY:  1. Status post right lower lobectomy under the care of Dr. Cyndia Bent on 04/08/2009. 2. Status post palliative radiotherapy to the right lower lobe recurrent lung mass under the care of Dr. Lisbeth Renshaw, completed on June 08, 2011. 3. Systemic chemotherapy with carboplatin for AUC of 5 and Alimta 500 mg/M2 every 3 weeks. She is status post 3 cycles.  CURRENT THERAPY: Tarceva 150 mg by mouth daily, therapy beginning 07/24/2012. Status post approximately 20 months of therapy.   CHEMOTHERAPY INTENT: Palliative  CURRENT # OF CHEMOTHERAPY CYCLES: 21 CURRENT ANTIEMETICS: Compazine  CURRENT SMOKING STATUS: Nonsmoker  ORAL CHEMOTHERAPY AND CONSENT: Tarceva  CURRENT BISPHOSPHONATES USE: None  PAIN MANAGEMENT: None  NARCOTICS INDUCED CONSTIPATION: None  LIVING WILL AND CODE STATUS: Full code  INTERVAL HISTORY: [name] 53 y.o. female returns to the clinic today for follow up visit accompanied by her interpreter. She is feeling good today except for dry nose and few episodes of diarrhea. She continues to have skin rash on the eyebrows. Unfortunately she lost her youngest son last month with questionable brain aneurysm. She is tearful at times. She denied having any significant chest pain, shortness of breath,  no hemoptysis. The patient denied having any significant weight loss or night sweats. She denied having any nausea or vomiting , fever or chills.   MEDICAL HISTORY: Past Medical History  Diagnosis Date  . Lung cancer     right lower lobe adenocarcinoma  . History of radiation therapy 05/02/11 to 06/08/11    right lung    ALLERGIES:  is allergic to codeine and  doxycycline.  MEDICATIONS:  Current Outpatient Prescriptions  Medication Sig Dispense Refill  . acetaminophen (TYLENOL) 325 MG tablet Take 650 mg by mouth every 6 (six) hours as needed. For pain      . albuterol (PROVENTIL HFA;VENTOLIN HFA) 108 (90 BASE) MCG/ACT inhaler Inhale 2 puffs into the lungs every 6 (six) hours as needed for wheezing.  1 Inhaler  2  . Aspirin-Salicylamide-Caffeine (BC HEADACHE POWDER PO) Take 1 packet by mouth as needed.      . chlorpheniramine-HYDROcodone (TUSSIONEX) 10-8 MG/5ML LQCR Take 5 mLs by mouth every 12 (twelve) hours as needed for cough.  115 mL  0  . clindamycin (CLEOCIN T) 1 % lotion Apply topically 2 (two) times daily.  60 mL  1  . erlotinib (TARCEVA) 150 MG tablet Take 1 tablet (150 mg total) by mouth daily.  30 tablet  1  . fluticasone (CUTIVATE) 0.05 % cream       . lidocaine-prilocaine (EMLA) cream Apply to Port-A-Cath prior to tx PRN  5 g  prn 1 year   No current facility-administered medications for this visit.    SURGICAL HISTORY:  Past Surgical History  Procedure Laterality Date  . Lung lobectomy  04/18/2009    RLL  . Portacath placement  02/29/2012    Procedure: INSERTION PORT-A-CATH;  Surgeon: Nicanor Alcon, MD;  Location: [facility];  Service: Thoracic;  Laterality: Left;  PowerPort 8 F attachable in left internal jugular.    REVIEW OF SYSTEMS:  Constitutional: negative Eyes: negative Ears, nose, mouth, throat, and face: negative Respiratory: negative Cardiovascular: negative Gastrointestinal:  negative Genitourinary:negative Integument/breast: negative Hematologic/lymphatic: negative Musculoskeletal:negative Neurological: negative Behavioral/Psych: negative Endocrine: negative Allergic/Immunologic: negative   PHYSICAL EXAMINATION: General appearance: alert, cooperative and no distress Head: Normocephalic, without obvious abnormality, atraumatic Neck: no adenopathy, no JVD, supple, symmetrical, trachea midline and thyroid not  enlarged, symmetric, no tenderness/mass/nodules Lymph nodes: Cervical, supraclavicular, and axillary nodes normal. Resp: wheezes bilaterally Back: symmetric, no curvature. ROM normal. No CVA tenderness. Cardio: regular rate and rhythm, S1, S2 normal, no murmur, click, rub or gallop GI: soft, non-tender; bowel sounds normal; no masses,  no organomegaly Extremities: extremities normal, atraumatic, no cyanosis or edema Neurologic: Alert and oriented X 3, normal strength and tone. Normal symmetric reflexes. Normal coordination and gait  ECOG PERFORMANCE STATUS: 1 - Symptomatic but completely ambulatory  Blood pressure 131/84, pulse 85, temperature 98.2 F (36.8 C), temperature source Oral, resp. rate 18, height 5' (1.524 m), weight 128 lb 11.2 oz (58.378 kg), SpO2 100.00%.  LABORATORY DATA: Lab Results  Component Value Date   WBC 7.3 07/14/2014   HGB 12.6 07/14/2014   HCT 39.8 07/14/2014   MCV 86.1 07/14/2014   PLT 288 07/14/2014      Chemistry      Component Value Date/Time   NA 140 06/04/2014 1039   NA 137 07/12/2012 1121   NA 141 01/06/2012 0804   K 4.0 06/04/2014 1039   K 3.9 07/12/2012 1121   K 4.6 01/06/2012 0804   CL 102 04/30/2013 1002   CL 104 07/12/2012 1121   CL 98 01/06/2012 0804   CO2 30* 06/04/2014 1039   CO2 24 07/12/2012 1121   CO2 28 01/06/2012 0804   BUN 8.2 06/04/2014 1039   BUN 8 07/12/2012 1121   BUN 10 01/06/2012 0804   CREATININE 0.7 06/04/2014 1039   CREATININE 0.45* 07/12/2012 1121   CREATININE 0.6 01/06/2012 0804      Component Value Date/Time   CALCIUM 9.8 06/04/2014 1039   CALCIUM 8.6 07/12/2012 1121   CALCIUM 8.9 01/06/2012 0804   ALKPHOS 96 06/04/2014 1039   ALKPHOS 93 07/12/2012 1121   ALKPHOS 64 01/06/2012 0804   AST 28 06/04/2014 1039   AST 44* 07/12/2012 1121   AST 27 01/06/2012 0804   ALT 36 06/04/2014 1039   ALT 49* 07/12/2012 1121   ALT 24 01/06/2012 0804   BILITOT 0.83 06/04/2014 1039   BILITOT 0.3 07/12/2012 1121   BILITOT 0.60 01/06/2012 0804       RADIOGRAPHIC  STUDIES:  ASSESSMENT AND PLAN: This is a very pleasant 53 years old Asian female with recurrent non-small cell lung cancer, adenocarcinoma. She is currently on treatment with oral Tarceva 150 mg by mouth daily status post 20 months of treatment. She is tolerating her treatment with Tarceva fairly well.  I recommended for the patient to continue with the current treatment. For the dry nose, I recommended to the patient to use saline nasal drops. For diarrhea, she will continue on Imodium on as-needed basis. I will see her back for followup visit in one month with repeat CBC and comprehensive metabolic panel. She was advised to call immediately if she has any concerning symptoms in the interval.  The patient voices understanding of current disease status and treatment options and is in agreement with the current care plan.  All questions were answered. The patient knows to call the clinic with any problems, questions or concerns. We can certainly see the patient much sooner if necessary.  Disclaimer: This note was dictated with voice recognition software. Similar  sounding words can inadvertently be transcribed and may not be corrected upon review.

## 2014-07-28 ENCOUNTER — Other Ambulatory Visit: Payer: Self-pay | Admitting: *Deleted

## 2014-07-28 DIAGNOSIS — C3491 Malignant neoplasm of unspecified part of right bronchus or lung: Secondary | ICD-10-CM

## 2014-07-28 MED ORDER — ERLOTINIB HCL 150 MG PO TABS: 150.0000 mg | ORAL_TABLET | Freq: Every day | ORAL | Status: DC

## 2014-08-11 ENCOUNTER — Other Ambulatory Visit (HOSPITAL_BASED_OUTPATIENT_CLINIC_OR_DEPARTMENT_OTHER): Payer: 59

## 2014-08-11 ENCOUNTER — Encounter: Payer: Self-pay | Admitting: Internal Medicine

## 2014-08-11 ENCOUNTER — Telehealth: Payer: Self-pay | Admitting: Internal Medicine

## 2014-08-11 ENCOUNTER — Ambulatory Visit (HOSPITAL_BASED_OUTPATIENT_CLINIC_OR_DEPARTMENT_OTHER): Payer: 59 | Admitting: Internal Medicine

## 2014-08-11 VITALS — BP 125/79 | HR 95 | Temp 99.1°F | Resp 18 | Ht 60.0 in | Wt 128.5 lb

## 2014-08-11 DIAGNOSIS — C3490 Malignant neoplasm of unspecified part of unspecified bronchus or lung: Secondary | ICD-10-CM

## 2014-08-11 DIAGNOSIS — R197 Diarrhea, unspecified: Secondary | ICD-10-CM

## 2014-08-11 DIAGNOSIS — C3430 Malignant neoplasm of lower lobe, unspecified bronchus or lung: Secondary | ICD-10-CM

## 2014-08-11 DIAGNOSIS — R6884 Jaw pain: Secondary | ICD-10-CM

## 2014-08-11 LAB — CBC WITH DIFFERENTIAL/PLATELET
BASO%: 0.4 % (ref 0.0–2.0)
Basophils Absolute: 0 10*3/uL (ref 0.0–0.1)
EOS%: 0.9 % (ref 0.0–7.0)
Eosinophils Absolute: 0.1 10*3/uL (ref 0.0–0.5)
HCT: 40.8 % (ref 34.8–46.6)
HGB: 13.2 g/dL (ref 11.6–15.9)
LYMPH%: 17.9 % (ref 14.0–49.7)
MCH: 27.4 pg (ref 25.1–34.0)
MCHC: 32.3 g/dL (ref 31.5–36.0)
MCV: 84.9 fL (ref 79.5–101.0)
MONO#: 0.5 10*3/uL (ref 0.1–0.9)
MONO%: 5.4 % (ref 0.0–14.0)
NEUT#: 7.1 10*3/uL — ABNORMAL HIGH (ref 1.5–6.5)
NEUT%: 75.4 % (ref 38.4–76.8)
Platelets: 313 10*3/uL (ref 145–400)
RBC: 4.81 10*6/uL (ref 3.70–5.45)
RDW: 15 % — ABNORMAL HIGH (ref 11.2–14.5)
WBC: 9.4 10*3/uL (ref 3.9–10.3)
lymph#: 1.7 10*3/uL (ref 0.9–3.3)

## 2014-08-11 LAB — COMPREHENSIVE METABOLIC PANEL (CC13)
ALT: 35 U/L (ref 0–55)
AST: 32 U/L (ref 5–34)
Albumin: 3.7 g/dL (ref 3.5–5.0)
Alkaline Phosphatase: 93 U/L (ref 40–150)
Anion Gap: 11 mEq/L (ref 3–11)
BUN: 9 mg/dL (ref 7.0–26.0)
CO2: 26 mEq/L (ref 22–29)
Calcium: 9.5 mg/dL (ref 8.4–10.4)
Chloride: 104 mEq/L (ref 98–109)
Creatinine: 0.7 mg/dL (ref 0.6–1.1)
Glucose: 123 mg/dl (ref 70–140)
Potassium: 4.3 mEq/L (ref 3.5–5.1)
Sodium: 142 mEq/L (ref 136–145)
Total Bilirubin: 0.93 mg/dL (ref 0.20–1.20)
Total Protein: 7.7 g/dL (ref 6.4–8.3)

## 2014-08-11 NOTE — Progress Notes (Signed)
[facility] Telephone:([phone]   Fax:(336) [phone]  OFFICE PROGRESS NOTE  [name], MD [place] [address]  PRINCIPAL DIAGNOSIS: Local recurrence of non-small cell lung cancer, adenocarcinoma, initially diagnosed as stage IB (T2a N0 M0) in March of 2010.   PRIOR THERAPY:  1. Status post right lower lobectomy under the care of Dr. Cyndia Bent on 04/08/2009. 2. Status post palliative radiotherapy to the right lower lobe recurrent lung mass under the care of Dr. Lisbeth Renshaw, completed on June 08, 2011. 3. Systemic chemotherapy with carboplatin for AUC of 5 and Alimta 500 mg/M2 every 3 weeks. She is status post 3 cycles.  CURRENT THERAPY: Tarceva 150 mg by mouth daily, therapy beginning 07/24/2012. Status post approximately 21 months of therapy.   CHEMOTHERAPY INTENT: Palliative  CURRENT # OF CHEMOTHERAPY CYCLES: 22 CURRENT ANTIEMETICS: Compazine  CURRENT SMOKING STATUS: Nonsmoker  ORAL CHEMOTHERAPY AND CONSENT: Tarceva  CURRENT BISPHOSPHONATES USE: None  PAIN MANAGEMENT: None  NARCOTICS INDUCED CONSTIPATION: None  LIVING WILL AND CODE STATUS: Full code  INTERVAL HISTORY: [name] 53 y.o. female returns to the clinic today for follow up visit accompanied by her interpreter. She is feeling good today except for occasional diarrhea and left lower jaw pain. She continues to have skin rash on the eyebrows. She denied having any significant chest pain, shortness of breath,  no hemoptysis. The patient denied having any significant weight loss or night sweats. She denied having any nausea or vomiting , fever or chills. She is tolerating her treatment was Tarceva fairly well  MEDICAL HISTORY: Past Medical History  Diagnosis Date  . Lung cancer     right lower lobe adenocarcinoma  . History of radiation therapy 05/02/11 to 06/08/11    right lung    ALLERGIES:  is allergic to codeine and doxycycline.  MEDICATIONS:  Current Outpatient  Prescriptions  Medication Sig Dispense Refill  . albuterol (PROVENTIL HFA;VENTOLIN HFA) 108 (90 BASE) MCG/ACT inhaler Inhale 2 puffs into the lungs every 6 (six) hours as needed for wheezing.  1 Inhaler  2  . Aspirin-Salicylamide-Caffeine (BC HEADACHE POWDER PO) Take 1 packet by mouth as needed.      . chlorpheniramine-HYDROcodone (TUSSIONEX) 10-8 MG/5ML LQCR Take 5 mLs by mouth every 12 (twelve) hours as needed for cough.  115 mL  0  . clindamycin (CLEOCIN T) 1 % lotion Apply topically 2 (two) times daily.  60 mL  1  . erlotinib (TARCEVA) 150 MG tablet Take 1 tablet (150 mg total) by mouth daily.  30 tablet  1  . fluticasone (CUTIVATE) 0.05 % cream       . lidocaine-prilocaine (EMLA) cream Apply to Port-A-Cath prior to tx PRN  5 g  prn 1 year  . acetaminophen (TYLENOL) 325 MG tablet Take 650 mg by mouth every 6 (six) hours as needed. For pain       No current facility-administered medications for this visit.    SURGICAL HISTORY:  Past Surgical History  Procedure Laterality Date  . Lung lobectomy  04/18/2009    RLL  . Portacath placement  02/29/2012    Procedure: INSERTION PORT-A-CATH;  Surgeon: Nicanor Alcon, MD;  Location: [facility];  Service: Thoracic;  Laterality: Left;  PowerPort 8 F attachable in left internal jugular.    REVIEW OF SYSTEMS:  Constitutional: negative Eyes: negative Ears, nose, mouth, throat, and face: negative Respiratory: negative Cardiovascular: negative Gastrointestinal: negative Genitourinary:negative Integument/breast: negative Hematologic/lymphatic: negative Musculoskeletal:negative Neurological: negative  Behavioral/Psych: negative Endocrine: negative Allergic/Immunologic: negative   PHYSICAL EXAMINATION: General appearance: alert, cooperative and no distress Head: Normocephalic, without obvious abnormality, atraumatic Neck: no adenopathy, no JVD, supple, symmetrical, trachea midline and thyroid not enlarged, symmetric, no tenderness/mass/nodules Lymph  nodes: Cervical, supraclavicular, and axillary nodes normal. Resp: wheezes bilaterally Back: symmetric, no curvature. ROM normal. No CVA tenderness. Cardio: regular rate and rhythm, S1, S2 normal, no murmur, click, rub or gallop GI: soft, non-tender; bowel sounds normal; no masses,  no organomegaly Extremities: extremities normal, atraumatic, no cyanosis or edema Neurologic: Alert and oriented X 3, normal strength and tone. Normal symmetric reflexes. Normal coordination and gait  ECOG PERFORMANCE STATUS: 1 - Symptomatic but completely ambulatory  Blood pressure 125/79, pulse 95, temperature 99.1 F (37.3 C), temperature source Oral, resp. rate 18, height 5' (1.524 m), weight 128 lb 8 oz (58.287 kg), SpO2 99.00%.  LABORATORY DATA: Lab Results  Component Value Date   WBC 9.4 08/11/2014   HGB 13.2 08/11/2014   HCT 40.8 08/11/2014   MCV 84.9 08/11/2014   PLT 313 08/11/2014      Chemistry      Component Value Date/Time   NA 140 07/14/2014 0836   NA 137 07/12/2012 1121   NA 141 01/06/2012 0804   K 3.8 07/14/2014 0836   K 3.9 07/12/2012 1121   K 4.6 01/06/2012 0804   CL 102 04/30/2013 1002   CL 104 07/12/2012 1121   CL 98 01/06/2012 0804   CO2 29 07/14/2014 0836   CO2 24 07/12/2012 1121   CO2 28 01/06/2012 0804   BUN 12.3 07/14/2014 0836   BUN 8 07/12/2012 1121   BUN 10 01/06/2012 0804   CREATININE 0.6 07/14/2014 0836   CREATININE 0.45* 07/12/2012 1121   CREATININE 0.6 01/06/2012 0804      Component Value Date/Time   CALCIUM 9.2 07/14/2014 0836   CALCIUM 8.6 07/12/2012 1121   CALCIUM 8.9 01/06/2012 0804   ALKPHOS 78 07/14/2014 0836   ALKPHOS 93 07/12/2012 1121   ALKPHOS 64 01/06/2012 0804   AST 27 07/14/2014 0836   AST 44* 07/12/2012 1121   AST 27 01/06/2012 0804   ALT 24 07/14/2014 0836   ALT 49* 07/12/2012 1121   ALT 24 01/06/2012 0804   BILITOT 0.57 07/14/2014 0836   BILITOT 0.3 07/12/2012 1121   BILITOT 0.60 01/06/2012 0804       RADIOGRAPHIC STUDIES:  ASSESSMENT AND PLAN: This is a very pleasant 53  years old Asian female with recurrent non-small cell lung cancer, adenocarcinoma. She is currently on treatment with oral Tarceva 150 mg by mouth daily status post 21 months of treatment. She is tolerating her treatment with Tarceva fairly well.  I recommended for the patient to continue with the current treatment. For diarrhea, she will continue on Imodium on as-needed basis. For the bilateral wheezes, advice the patient will continue with her albuterol inhaler. For the left lower jaw pain, I referred her to Dr. Enrique Sack for evaluation. I will see her back for followup visit in one month with repeat CBC and comprehensive metabolic panel as well as CT scan of the chest for restaging of her disease. She was advised to call immediately if she has any concerning symptoms in the interval.  The patient voices understanding of current disease status and treatment options and is in agreement with the current care plan.  All questions were answered. The patient knows to call the clinic with any problems, questions or concerns. We can certainly see  the patient much sooner if necessary.  Disclaimer: This note was dictated with voice recognition software. Similar sounding words can inadvertently be transcribed and may not be corrected upon review.

## 2014-08-11 NOTE — Telephone Encounter (Signed)
Pt confirmed labs/ov/flush per 09/14 P[facility], [facility] referral advised they would schedule pt if appt needed, gave pt AVS....KJ

## 2014-09-01 ENCOUNTER — Encounter (INDEPENDENT_AMBULATORY_CARE_PROVIDER_SITE_OTHER): Payer: Self-pay

## 2014-09-01 ENCOUNTER — Ambulatory Visit (HOSPITAL_COMMUNITY): Payer: Self-pay | Admitting: Dentistry

## 2014-09-01 ENCOUNTER — Encounter (HOSPITAL_COMMUNITY): Payer: Self-pay | Admitting: Dentistry

## 2014-09-01 VITALS — BP 117/83 | HR 92 | Temp 98.1°F

## 2014-09-01 DIAGNOSIS — K08101 Complete loss of teeth, unspecified cause, class I: Secondary | ICD-10-CM

## 2014-09-01 DIAGNOSIS — K0530 Chronic periodontitis, unspecified: Secondary | ICD-10-CM

## 2014-09-01 DIAGNOSIS — K011 Impacted teeth: Secondary | ICD-10-CM

## 2014-09-01 DIAGNOSIS — M264 Malocclusion, unspecified: Secondary | ICD-10-CM

## 2014-09-01 DIAGNOSIS — K030 Excessive attrition of teeth: Secondary | ICD-10-CM

## 2014-09-01 DIAGNOSIS — IMO0002 Reserved for concepts with insufficient information to code with codable children: Secondary | ICD-10-CM

## 2014-09-01 DIAGNOSIS — K001 Supernumerary teeth: Secondary | ICD-10-CM

## 2014-09-01 DIAGNOSIS — K036 Deposits [accretions] on teeth: Secondary | ICD-10-CM

## 2014-09-01 DIAGNOSIS — C3431 Malignant neoplasm of lower lobe, right bronchus or lung: Secondary | ICD-10-CM

## 2014-09-01 NOTE — Progress Notes (Signed)
DENTAL CONSULTATION  Date of Consultation:  09/01/2014 Patient Name:   [name] Date of Birth:   Aug 23, 1961 Medical Record Number: [id number]  VITALS: BP 117/83  Pulse 92  Temp(Src) 98.1 F (36.7 C) (Oral)   CHIEF COMPLAINT: Patient referred by Dr. Julien Nordmann for evaluation to rule out dental infection that may affect the patient's systemic health while on chemotherapy.   HPI: [name] is a 53 year old [place] female with history of recurrent right lower lobe non-small cell lung cancer, adenocarcinoma. The patient has a history of lobectomy on 04/18/2009 with Dr. Cyndia Bent. Patient underwent radiation therapy from 05/02/2011 through 06/08/2011 with Dr. Lisbeth Renshaw. Patient with previous history of Alimpta and carboplatin chemotherapy now on Tarceva oral chemotherapy. Patient was referred for medically necessary dental evaluation to rule out dental infection that may affect the patient's systemic health while an oral chemotherapy.  The patient presents with an interpreter and her son to assist in prevention of dental history. The patient has a history of lower left molar pain approximately two to three weeks ago that lasted for 2-3 days. Patient describes a " scratchy pain" involving the lower left molar #17. The tooth hurt at a 5/10 intensity. Patient currently denies any tooth pain today. Patient indicates that the pain was relieved with Tylenol or ibuprofen.  Patient indicates that it did hurt when she chewed in that area.  Patient was last seen by dentist approximately 2-3 years ago prior to placement of the Port-A-Cath for chemotherapy.  Patient had a lower left molar #18 extracted at that time. Patient denies any complications from that dental extraction.  The patient and son cannot remember the name of that dentist. Patient denies having any regular dental care.  Patient has never had her teeth cleaned by report.     PROBLEM LIST: Patient Active Problem List   Diagnosis Date Noted  . History of  radiation therapy   . Lung cancer 05/06/2009  . LEIOMYOMA, UTERUS 05/06/2009    PMH: Past Medical History  Diagnosis Date  . Lung cancer     right lower lobe adenocarcinoma  . History of radiation therapy 05/02/11 to 06/08/11    right lung    PSH: Past Surgical History  Procedure Laterality Date  . Lung lobectomy  04/18/2009    RLL  . Portacath placement  02/29/2012    Procedure: INSERTION PORT-A-CATH;  Surgeon: Nicanor Alcon, MD;  Location: [facility];  Service: Thoracic;  Laterality: Left;  PowerPort 8 F attachable in left internal jugular.    ALLERGIES: Allergies  Allergen Reactions  . Codeine Nausea Only  . Doxycycline Nausea And Vomiting    vomiting and headache    MEDICATIONS: Current Outpatient Prescriptions  Medication Sig Dispense Refill  . acetaminophen (TYLENOL) 325 MG tablet Take 650 mg by mouth every 6 (six) hours as needed. For pain      . albuterol (PROVENTIL HFA;VENTOLIN HFA) 108 (90 BASE) MCG/ACT inhaler Inhale 2 puffs into the lungs every 6 (six) hours as needed for wheezing.  1 Inhaler  2  . Aspirin-Salicylamide-Caffeine (BC HEADACHE POWDER PO) Take 1 packet by mouth as needed.      . chlorpheniramine-HYDROcodone (TUSSIONEX) 10-8 MG/5ML LQCR Take 5 mLs by mouth every 12 (twelve) hours as needed for cough.  115 mL  0  . clindamycin (CLEOCIN T) 1 % lotion Apply topically 2 (two) times daily.  60 mL  1  . erlotinib (TARCEVA) 150 MG tablet Take 1 tablet (150 mg total) by mouth daily.  30 tablet  1  . fluticasone (CUTIVATE) 0.05 % cream       . lidocaine-prilocaine (EMLA) cream Apply to Port-A-Cath prior to tx PRN  5 g  prn 1 year   No current facility-administered medications for this visit.    LABS: Lab Results  Component Value Date   WBC 9.4 08/11/2014   HGB 13.2 08/11/2014   HCT 40.8 08/11/2014   MCV 84.9 08/11/2014   PLT 313 08/11/2014      Component Value Date/Time   NA 142 08/11/2014 0912   NA 137 07/12/2012 1121   NA 141 01/06/2012 0804   K 4.3  08/11/2014 0912   K 3.9 07/12/2012 1121   K 4.6 01/06/2012 0804   CL 102 04/30/2013 1002   CL 104 07/12/2012 1121   CL 98 01/06/2012 0804   CO2 26 08/11/2014 0912   CO2 24 07/12/2012 1121   CO2 28 01/06/2012 0804   GLUCOSE 123 08/11/2014 0912   GLUCOSE 95 04/30/2013 1002   GLUCOSE 200* 07/12/2012 1121   GLUCOSE 100 01/06/2012 0804   BUN 9.0 08/11/2014 0912   BUN 8 07/12/2012 1121   BUN 10 01/06/2012 0804   CREATININE 0.7 08/11/2014 0912   CREATININE 0.45* 07/12/2012 1121   CREATININE 0.6 01/06/2012 0804   CALCIUM 9.5 08/11/2014 0912   CALCIUM 8.6 07/12/2012 1121   CALCIUM 8.9 01/06/2012 0804   GFRNONAA >90 02/29/2012 0836   GFRAA >90 02/29/2012 0836   Lab Results  Component Value Date   INR 0.88 02/29/2012   INR 0.87 04/08/2011   INR 0.87 03/09/2011   No results found for this basename: PTT    SOCIAL HISTORY: History   Social History  . Marital Status: Married    Spouse Name: N/A    Number of Children: 7  . Years of Education: N/A   Occupational History  . Not on file.   Social History Main Topics  . Smoking status: Never Smoker   . Smokeless tobacco: Never Used  . Alcohol Use: No  . Drug Use: No  . Sexual Activity: Not on file   Other Topics Concern  . Not on file   Social History Narrative   09/01/2014   The patient is a 53 year old [place] woman.   The patient is married and has 7 children. One child passed away.   The patient came to the [place] in approximately 1990. They were in [place] for 10-11 years and then moved to [place], [place] since then.   Patient does not smoke, drink, or use illicit drugs.   Patient has not used smokeless tobacco products.    FAMILY HISTORY: Family History  Problem Relation Age of Onset  . Cancer Neg Hx   . Heart Problems Mother    REVIEW OF SYSTEMS: Reviewed with patient, interpreter,and son and is in dental record.  DENTAL HISTORY: CHIEF COMPLAINT: Patient referred by Dr. Julien Nordmann for evaluation to rule out dental infection  that may affect the patient's systemic health while on chemotherapy.   HPI: [name] is a 53 year old [place] female with history of recurrent right lower lobe non-small cell lung cancer, adenocarcinoma. The patient has a history of lobectomy on 04/18/2009 with Dr. Cyndia Bent. Patient underwent radiation therapy from 05/02/2011 through 06/08/2011 with Dr. Lisbeth Renshaw. Patient with previous history of Alimpta and carboplatin chemotherapy now on Tarceva oral chemotherapy. Patient was referred for medically necessary dental evaluation to rule out dental infection that may affect the patient's systemic health while an  oral chemotherapy.  The patient presents with an interpreter and her son to assist in prevention of dental history. The patient has a history of lower left molar pain approximately two to three weeks ago that lasted for 2-3 days. Patient describes a " scratchy pain" involving the lower left molar #17. The tooth hurt at a 5/10 intensity. Patient currently denies any tooth pain today. Patient indicates that the pain was relieved with Tylenol or ibuprofen.  Patient indicates that it did hurt when she chewed in that area.  Patient was last seen by dentist approximately 2-3 years ago prior to placement of the Port-A-Cath for chemotherapy.  Patient had a lower left molar #18 extracted at that time. Patient denies any complications from that dental extraction.  The patient and son cannot remember the name of that dentist. Patient denies having any regular dental care.  Patient has never had her teeth cleaned by report.    DENTAL EXAMINATION: GENERAL: The patient is a well-developed, slightly build female in no acute distress. HEAD AND NECK: There is no palpable lymphadenopathy. The patient denies acute TMJ symptoms. INTRAORAL EXAM: Patient has normal saliva. Patient has bilateral mandibular lingual tori. The patient has facial exostoses in the area of tooth numbers 1 through 7 and 10 through 16. DENTITION:  Patient is missing tooth #18. The patient has a supernumerary tooth in the area of #1. Tooth #1 is also an impacted tooth. PERIODONTAL: The patient has chronic periodontitis with plaque and calculus accumulations, selective areas of gingival recession and no significant tooth mobility. A calculus bridge is noted on the lingual from tooth numbers 23 through 26. DENTAL CARIES/SUBOPTIMAL RESTORATIONS: There are no obvious dental caries noted. There multiple areas of erosions/attrition. ENDODONTIC: Patient had a history of acute pulpitis symptoms, but none today. I do not see any evidence of periapical pathology or radiolucency CROWN AND BRIDGE: There are no crown or bridge restorations. PROSTHODONTIC: There are no partial dentures. OCCLUSION: Patient has a poor occlusal scheme but a stable occlusion at this time.  RADIOGRAPHIC INTERPRETATION: An orthopantogram was taken and supplemented with a full series of dental radiographs. The full series is suboptimal secondary to lack of patient's cooperation and the language barrier. There is missing tooth #18. There is an impacted tooth #1. There is a supernumerary tooth in the area of #1.  There is supra-eruption and drifting of the unopposed teeth into the edentulous areas. Radiographic calculus is noted. There is incipient bone loss noted. There is evidence of tooth structure loss consistent with attrition/erosion.  ASSESSMENTS: 1. Recurrent non-small cell right lung cancer ( adenocarcinoma). 2. Active chemotherapy with Tarceva 3. History of tooth pain #17-none today 4. Chronic periodontitis with bone loss 5. Accretions 6. Selective areas of gingival recession 7. Missing tooth #18. 8. Impacted tooth #1 9. Supernumerary tooth in the area of #1 that is erupted. 10. Supra-eruption and drifting of the unopposed teeth into the edentulous areas 11. Poor occlusal scheme but a stable occlusion 12. Generalized areas of attrition/erosion 13. No obvious  periapical pathology noted today.   PLAN/RECOMMENDATIONS: 1. I discussed the risks, benefits, and complications of various treatment options with the patient, son, and interpreter in relationship to her medical and dental conditions, active chemotherapy, and future use of IV bisphosphonate or Xgeva therapy. We discussed various treatment options to include no treatment, multiple extractions with alveoloplasty, pre-prosthetic surgery as indicated, periodontal therapy, dental restorations, root canal therapy, crown and bridge therapy, implant therapy, and replacement of missing teeth as indicated.  We also discussed referral back to her general dentist or an oral surgeon. The patient currently wishes to defer dental extraction at this time, but may follow up with her previous dentist for a dental cleaning. The patient and son expressed understanding that she will need to be referred to an oral surgeon for multiple extractions prior to use of IV bisphosphonate or Xgeva therapies in the future.  The patient and son expressed understanding that Dr. Julien Nordmann should be contacted prior to dental treatment to ensure that her counts are acceptable for the dental procedures at that time.  2. Discussion of findings with medical team and coordination of future medical and dental care as needed.  I spent 75 minutes face to face with patient and more than 50% of time was spent in counseling and /or coordination of care.   Lenn Cal, DDS

## 2014-09-01 NOTE — Patient Instructions (Signed)
PLAN/RECOMMENDATIONS: 1. I discussed the risks, benefits, and complications of various treatment options with the patient, son, and interpreter in relationship to her medical and dental conditions, active chemotherapy, and future use of IV bisphosphonate or Xgeva therapy. We discussed various treatment options to include no treatment, multiple extractions with alveoloplasty, pre-prosthetic surgery as indicated, periodontal therapy, dental restorations, root canal therapy, crown and bridge therapy, implant therapy, and replacement of missing teeth as indicated.  We also discussed referral back to her general dentist or an oral surgeon. The patient currently wishes to defer dental extraction at this time, but may follow up with her previous dentist for a dental cleaning. The patient and son expressed understanding that she will need to be referred to an oral surgeon for multiple extractions prior to use of IV bisphosphonate or Xgeva therapies in the future.  The patient and son expressed understanding that Dr. Julien Nordmann should be contacted prior to dental treatment to ensure that her counts are acceptable for the dental procedures at that time.  Dr. Enrique Sack

## 2014-09-07 ENCOUNTER — Inpatient Hospital Stay (HOSPITAL_COMMUNITY)
Admission: EM | Admit: 2014-09-07 | Discharge: 2014-09-09 | DRG: 193 | Disposition: A | Discharge status: Home / Self Care | Payer: 59 | Attending: Internal Medicine | Admitting: Internal Medicine

## 2014-09-07 ENCOUNTER — Emergency Department (HOSPITAL_COMMUNITY): Payer: 59

## 2014-09-07 ENCOUNTER — Encounter (HOSPITAL_COMMUNITY): Payer: Self-pay | Admitting: Emergency Medicine

## 2014-09-07 DIAGNOSIS — J189 Pneumonia, unspecified organism: Secondary | ICD-10-CM | POA: Diagnosis present

## 2014-09-07 DIAGNOSIS — T451X5A Adverse effect of antineoplastic and immunosuppressive drugs, initial encounter: Secondary | ICD-10-CM | POA: Diagnosis present

## 2014-09-07 DIAGNOSIS — C3490 Malignant neoplasm of unspecified part of unspecified bronchus or lung: Secondary | ICD-10-CM | POA: Diagnosis present

## 2014-09-07 DIAGNOSIS — Z881 Allergy status to other antibiotic agents status: Secondary | ICD-10-CM

## 2014-09-07 DIAGNOSIS — Z79899 Other long term (current) drug therapy: Secondary | ICD-10-CM | POA: Diagnosis not present

## 2014-09-07 DIAGNOSIS — Z923 Personal history of irradiation: Secondary | ICD-10-CM

## 2014-09-07 DIAGNOSIS — D638 Anemia in other chronic diseases classified elsewhere: Secondary | ICD-10-CM | POA: Diagnosis present

## 2014-09-07 DIAGNOSIS — J9601 Acute respiratory failure with hypoxia: Secondary | ICD-10-CM | POA: Diagnosis present

## 2014-09-07 DIAGNOSIS — J8410 Pulmonary fibrosis, unspecified: Secondary | ICD-10-CM | POA: Diagnosis present

## 2014-09-07 DIAGNOSIS — Z885 Allergy status to narcotic agent status: Secondary | ICD-10-CM

## 2014-09-07 DIAGNOSIS — C3491 Malignant neoplasm of unspecified part of right bronchus or lung: Secondary | ICD-10-CM

## 2014-09-07 DIAGNOSIS — R0602 Shortness of breath: Secondary | ICD-10-CM | POA: Diagnosis not present

## 2014-09-07 DIAGNOSIS — D6481 Anemia due to antineoplastic chemotherapy: Secondary | ICD-10-CM | POA: Diagnosis present

## 2014-09-07 DIAGNOSIS — Z902 Acquired absence of lung [part of]: Secondary | ICD-10-CM | POA: Diagnosis present

## 2014-09-07 DIAGNOSIS — C3431 Malignant neoplasm of lower lobe, right bronchus or lung: Secondary | ICD-10-CM | POA: Diagnosis present

## 2014-09-07 LAB — CBC WITH DIFFERENTIAL/PLATELET
Basophils Absolute: 0 10*3/uL (ref 0.0–0.1)
Basophils Relative: 0 % (ref 0–1)
Eosinophils Absolute: 0.1 10*3/uL (ref 0.0–0.7)
Eosinophils Relative: 1 % (ref 0–5)
HCT: 35.3 % — ABNORMAL LOW (ref 36.0–46.0)
Hemoglobin: 11.5 g/dL — ABNORMAL LOW (ref 12.0–15.0)
Lymphocytes Relative: 14 % (ref 12–46)
Lymphs Abs: 1.6 10*3/uL (ref 0.7–4.0)
MCH: 28 pg (ref 26.0–34.0)
MCHC: 32.6 g/dL (ref 30.0–36.0)
MCV: 86.1 fL (ref 78.0–100.0)
Monocytes Absolute: 0.8 10*3/uL (ref 0.1–1.0)
Monocytes Relative: 7 % (ref 3–12)
Neutro Abs: 9 10*3/uL — ABNORMAL HIGH (ref 1.7–7.7)
Neutrophils Relative %: 78 % — ABNORMAL HIGH (ref 43–77)
Platelets: 297 10*3/uL (ref 150–400)
RBC: 4.1 MIL/uL (ref 3.87–5.11)
RDW: 14.4 % (ref 11.5–15.5)
WBC: 11.5 10*3/uL — ABNORMAL HIGH (ref 4.0–10.5)

## 2014-09-07 LAB — URINALYSIS, ROUTINE W REFLEX MICROSCOPIC
Bilirubin Urine: NEGATIVE
Glucose, UA: NEGATIVE mg/dL
Hgb urine dipstick: NEGATIVE
Ketones, ur: NEGATIVE mg/dL
Nitrite: NEGATIVE
Protein, ur: NEGATIVE mg/dL
Specific Gravity, Urine: 1.008 (ref 1.005–1.030)
Urobilinogen, UA: 1 mg/dL (ref 0.0–1.0)
pH: 7 (ref 5.0–8.0)

## 2014-09-07 LAB — BASIC METABOLIC PANEL
Anion gap: 14 (ref 5–15)
BUN: 6 mg/dL (ref 6–23)
CO2: 27 mEq/L (ref 19–32)
Calcium: 9 mg/dL (ref 8.4–10.5)
Chloride: 102 mEq/L (ref 96–112)
Creatinine, Ser: 0.44 mg/dL — ABNORMAL LOW (ref 0.50–1.10)
GFR calc Af Amer: >90 mL/min (ref >90)
GFR calc non Af Amer: >90 mL/min (ref >90)
Glucose, Bld: 94 mg/dL (ref 70–99)
Potassium: 4.1 mEq/L (ref 3.7–5.3)
Sodium: 143 mEq/L (ref 137–147)

## 2014-09-07 LAB — URINE MICROSCOPIC-ADD ON

## 2014-09-07 LAB — RAPID STREP SCREEN (MED CTR MEBANE ONLY): Streptococcus, Group A Screen (Direct): NEGATIVE

## 2014-09-07 MED ORDER — ACETAMINOPHEN 325 MG PO TABS
650.0000 mg | ORAL_TABLET | Freq: Once | ORAL | Status: AC
  Administered 2014-09-07: 650 mg via ORAL
  Filled 2014-09-07: qty 2

## 2014-09-07 MED ORDER — IOHEXOL 300 MG/ML  SOLN
80.0000 mL | Freq: Once | INTRAMUSCULAR | Status: AC | PRN
  Administered 2014-09-07: 80 mL via INTRAVENOUS

## 2014-09-07 MED ORDER — SODIUM CHLORIDE 0.9 % IV BOLUS (SEPSIS)
1000.0000 mL | Freq: Once | INTRAVENOUS | Status: AC
  Administered 2014-09-07: 1000 mL via INTRAVENOUS

## 2014-09-07 MED ORDER — LEVOFLOXACIN IN D5W 750 MG/150ML IV SOLN
750.0000 mg | Freq: Once | INTRAVENOUS | Status: AC
  Administered 2014-09-07: 750 mg via INTRAVENOUS
  Filled 2014-09-07: qty 150

## 2014-09-07 NOTE — ED Provider Notes (Signed)
CSN: [id number]     Arrival date & time 09/07/14  1654 History   First MD Initiated Contact with Patient 09/07/14 1706     Chief Complaint  Patient presents with  . Generalized Body Aches  . Fever  . Sore Throat     (Consider location/radiation/quality/duration/timing/severity/associated sxs/prior Treatment) Patient is a 53 y.o. female presenting with fever and pharyngitis. The history is provided by the patient and a relative. The history is limited by a language barrier. A language interpreter was used (family).  Fever Max temp prior to arrival:  Felt warm Temp source:  Subjective Severity:  Moderate Onset quality:  Gradual Duration:  3 days Timing:  Constant Chronicity:  New Relieved by:  Nothing Worsened by:  Nothing tried Ineffective treatments:  Acetaminophen and ibuprofen Associated symptoms: chills, cough and myalgias   Associated symptoms: no chest pain, no diarrhea, no dysuria, no nausea and no vomiting   Risk factors: hx of cancer and immunosuppression ( on PO chemotherapy)   Risk factors: no sick contacts   Sore Throat Associated symptoms include arthralgias, chills, coughing, a fever and myalgias. Pertinent negatives include no abdominal pain, chest pain, nausea or vomiting.   Pt reports 3 day hx of generalized body aches as well as sore throat since Friday, 10/9.  Pt reports subjective fever, states she just feels warm. Last took tylenol and aleve around 5AM w/o relief.      Past Medical History  Diagnosis Date  . Lung cancer     right lower lobe adenocarcinoma  . History of radiation therapy 05/02/11 to 06/08/11    right lung   Past Surgical History  Procedure Laterality Date  . Lung lobectomy  04/18/2009    RLL  . Portacath placement  02/29/2012    Procedure: INSERTION PORT-A-CATH;  Surgeon: Nicanor Alcon, MD;  Location: [facility];  Service: Thoracic;  Laterality: Left;  PowerPort 8 F attachable in left internal jugular.   Family History  Problem Relation Age  of Onset  . Cancer Neg Hx   . Heart Problems Mother    History  Substance Use Topics  . Smoking status: Never Smoker   . Smokeless tobacco: Never Used  . Alcohol Use: No   OB History   Grav Para Term Preterm Abortions TAB SAB Ect Mult Living                 Review of Systems  Constitutional: Positive for fever and chills. Negative for appetite change and unexpected weight change.  Respiratory: Positive for cough. Negative for shortness of breath.   Cardiovascular: Negative for chest pain and palpitations.  Gastrointestinal: Negative for nausea, vomiting, abdominal pain and diarrhea.  Genitourinary: Negative for dysuria, urgency, frequency, hematuria, flank pain, decreased urine volume, vaginal discharge and vaginal pain.  Musculoskeletal: Positive for arthralgias and myalgias.  Skin: Negative for color change and wound.  All other systems reviewed and are negative.     Allergies  Codeine and Doxycycline  Home Medications   Prior to Admission medications   Medication Sig Start Date End Date Taking? Authorizing Provider  acetaminophen (TYLENOL) 325 MG tablet Take 650 mg by mouth every 6 (six) hours as needed. For pain    Historical Provider, MD  albuterol (PROVENTIL HFA;VENTOLIN HFA) 108 (90 BASE) MCG/ACT inhaler Inhale 2 puffs into the lungs every 6 (six) hours as needed for wheezing. 04/02/13   Curt Bears, MD  Aspirin-Salicylamide-Caffeine [facility] HEADACHE POWDER PO) Take 1 packet by mouth as needed.  Historical Provider, MD  chlorpheniramine-HYDROcodone (TUSSIONEX) 10-8 MG/5ML LQCR Take 5 mLs by mouth every 12 (twelve) hours as needed for cough. 06/27/14   Carlton Adam, PA-C  clindamycin (CLEOCIN T) 1 % lotion Apply topically 2 (two) times daily. 06/09/14   Curt Bears, MD  erlotinib (TARCEVA) 150 MG tablet Take 1 tablet (150 mg total) by mouth daily. 07/28/14   Curt Bears, MD  fluticasone (CUTIVATE) 0.05 % cream  10/23/12   Historical Provider, MD   lidocaine-prilocaine (EMLA) cream Apply to Port-A-Cath prior to tx PRN 11/25/13   Curt Bears, MD   BP 102/59  Pulse 113  Temp(Src) 100.9 F (38.3 C) (Oral)  Resp 20  SpO2 96% Physical Exam  Nursing note and vitals reviewed. Constitutional: She appears well-developed and well-nourished. No distress.  Pt sitting up in exam bed, NAD.   HENT:  Head: Normocephalic and atraumatic.  Eyes: Conjunctivae are normal. No scleral icterus.  Neck: Normal range of motion. Neck supple.  No nuchal rigidity or meningeal signs.  Cardiovascular: Regular rhythm and normal heart sounds.  Tachycardia present.   Pulmonary/Chest: Effort normal. No respiratory distress. She has no wheezes. She has no rales. She exhibits no tenderness.  No respiratory distress. Lungs: CTAB, with decreased breath sounds in Right lower lung fields.  Abdominal: Soft. Bowel sounds are normal. She exhibits no distension and no mass. There is no tenderness. There is no rebound and no guarding.  Musculoskeletal: Normal range of motion.  Neurological: She is alert.  Skin: Skin is warm and dry. She is not diaphoretic.    ED Course  Procedures (including critical care time) Labs Review Labs Reviewed  CBC WITH DIFFERENTIAL - Abnormal; Notable for the following:    WBC 11.5 (*)    Hemoglobin 11.5 (*)    HCT 35.3 (*)    Neutrophils Relative % 78 (*)    Neutro Abs 9.0 (*)    All other components within normal limits  RAPID STREP SCREEN  BASIC METABOLIC PANEL    Imaging Review No results found.   EKG Interpretation None      MDM   Final diagnoses:  None   Pt is a 53yo female c/o generalized pain and subjective fever. Pt is a lung cancer pt on PO chemo.  Will get labs, CXR and rapid strep.  Pt given acetaminophen for temp of 100.9.   7:08 PM Pt signed out to [employer] PA-C at shift change. Plan is to f/u on labs, tx and dispo appropriately .     Noland Fordyce, PA-C 09/07/14 1909

## 2014-09-07 NOTE — ED Provider Notes (Signed)
Patient signed out to me by Hilda Blades, PA-C at change of shift. Generalized body aches, fever, sore throat since Friday. On PO chemo for lung cancer. Will reassess after labs and imaging.   Patient with pneumonia seen on CT chest with contrast. Patient is febrile, tachycardic, and mildly hypotensive. Patient also with UTI. Given IVF and Levaquin.   Patient is persistently, mildly, hypotensive. Will admit to medicine for further evaluation. Vital signs stable. Admission is appreciated. Discussed case with Dr. Darl Householder who agrees with plan. Patient / Family / Caregiver informed of clinical course, understand medical decision-making process, and agree with plan.   Results for orders placed in visit on 08/11/14  CBC WITH DIFFERENTIAL      Result Value Ref Range   WBC 9.4  3.9 - 10.3 10e3/uL   NEUT# 7.1 (*) 1.5 - 6.5 10e3/uL   HGB 13.2  11.6 - 15.9 g/dL   HCT 40.8  34.8 - 46.6 %   Platelets 313  145 - 400 10e3/uL   MCV 84.9  79.5 - 101.0 fL   MCH 27.4  25.1 - 34.0 pg   MCHC 32.3  31.5 - 36.0 g/dL   RBC 4.81  3.70 - 5.45 10e6/uL   RDW 15.0 (*) 11.2 - 14.5 %   lymph# 1.7  0.9 - 3.3 10e3/uL   MONO# 0.5  0.1 - 0.9 10e3/uL   Eosinophils Absolute 0.1  0.0 - 0.5 10e3/uL   Basophils Absolute 0.0  0.0 - 0.1 10e3/uL   NEUT% 75.4  38.4 - 76.8 %   LYMPH% 17.9  14.0 - 49.7 %   MONO% 5.4  0.0 - 14.0 %   EOS% 0.9  0.0 - 7.0 %   BASO% 0.4  0.0 - 2.0 %  COMPREHENSIVE METABOLIC PANEL (YK99)      Result Value Ref Range   Sodium 142  136 - 145 mEq/L   Potassium 4.3  3.5 - 5.1 mEq/L   Chloride 104  98 - 109 mEq/L   CO2 26  22 - 29 mEq/L   Glucose 123  70 - 140 mg/dl   BUN 9.0  7.0 - 26.0 mg/dL   Creatinine 0.7  0.6 - 1.1 mg/dL   Total Bilirubin 0.93  0.20 - 1.20 mg/dL   Alkaline Phosphatase 93  40 - 150 U/L   AST 32  5 - 34 U/L   ALT 35  0 - 55 U/L   Total Protein 7.7  6.4 - 8.3 g/dL   Albumin 3.7  3.5 - 5.0 g/dL   Calcium 9.5  8.4 - 10.4 mg/dL   Anion Gap 11  3 - 11 mEq/L    Dg Chest 2  View  09/07/2014   CLINICAL DATA:  Generalized body aches, fever and cough for few days. History of lung cancer. Ongoing chemotherapy. Initial encounter.  EXAM: CHEST  2 VIEW  COMPARISON:  Chest CT- 06/04/2014  FINDINGS: Grossly unchanged cardiac silhouette and mediastinal contours. Stable postsurgical change of the right hilum with associated right-sided volume loss and chronic trace right-sided pleural effusion. Grossly unchanged right basilar heterogeneous opacities. No new focal airspace opacities. No evidence of edema. No pneumothorax. Unchanged bones.  IMPRESSION: 1.  No acute cardiopulmonary disease. 2. Stable postsurgical change of the right lung with volume loss and chronic trace right-sided effusion.   Electronically Signed   By: Sandi Mariscal M.D.   On: 09/07/2014 19:08   Ct Chest W Contrast  09/07/2014   CLINICAL DATA:  Pleural effusion. Evaluate for  underlying pneumonia due to arthralgias, cough, and fever. History of right-sided lung cancer treated surgically and with radiation therapy. Initial encounter  EXAM: CT CHEST WITH CONTRAST  TECHNIQUE: Multidetector CT imaging of the chest was performed during intravenous contrast administration.  CONTRAST:  28mL OMNIPAQUE IOHEXOL 300 MG/ML  SOLN  COMPARISON:  06/04/2014  FINDINGS: THORACIC INLET/BODY WALL:  No acute abnormality.  Porta catheter from the left, tip at the distal SVC. The left lower IJ and brachiocephalic vein appear chronically attenuated.  MEDIASTINUM:  Normal heart size. No pericardial effusion. No acute vascular findings. Chronic enlargement of the right anterior juxtadiaphragmatic lymph node, size similar to 11/22/2013.  LUNG WINDOWS:  Stable post treatment changes on the right, which chain sutures related to lower lobe resection. Nodularity and perihilar volume loss and opacification the stable over multiple examinations, and most compatible with radiation fibrosis. There is however new perihilar ground-glass density at the right  base, suspicious for early infection given the clinical circumstances. Narrowing and distortion of the right middle lobe bronchi is chronic. No evidence of gross, obstructive mass lesion. Chronic, small right pleural effusion with stable thin posterior pleural enhancement.  UPPER ABDOMEN:  No acute findings.  OSSEOUS:  Remote posterior right sixth and seventh rib fractures.  IMPRESSION: 1. Right middle lobe pneumonia. 2. 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 3. Chronic small right pleural effusion.   Electronically Signed   By: Jorje Guild M.D.   On: 09/07/2014 21:34     Elwyn Lade, PA-C 09/08/14 1502

## 2014-09-07 NOTE — ED Provider Notes (Signed)
Medical screening examination/treatment/procedure(s) were performed by non-physician practitioner and as supervising physician I was immediately available for consultation/collaboration.   EKG Interpretation None        Wandra Arthurs, MD 09/07/14 2342

## 2014-09-07 NOTE — ED Notes (Signed)
Patient transported to X-ray 

## 2014-09-07 NOTE — ED Notes (Signed)
Patient transported to CT 

## 2014-09-07 NOTE — ED Notes (Signed)
Pt ambulated around nursing station x 1 maintaining O2 sats of 96% pt did briefly drop to 93% on RA then quickly rebounded to 96%.

## 2014-09-07 NOTE — ED Notes (Signed)
Pt states fever, body aches and sore throat since Friday.  Unknown fever amount, just felt warm.

## 2014-09-07 NOTE — H&P (Signed)
[facility] History and Physical  Amaurie Schreckengost YNW:[id number] DOB: [birth year] DOA: 09/07/2014  Referring physician: ED physician PCP: [name], MD   Chief Complaint: Shortness of breath  HPI:  Patient is 53 year old female with history of lung cancer, who presents to [facility] with main concern of several days duration of progressively worsening shortness of breath, initially started with exertion and not present at rest. Patient also explains that has been associated with subjective fevers and chills, poor oral intake, intermittent episodes of nausea. She denies any sick contacts or exposures, no similar events in the past. Patient denies chest pain, no specific abdominal or urinary concerns.  In emergency department, patient noted to be hemodynamically stable, temperature 100.9 Fahrenheit, WBC 11.5. CT chest worrisome for developing pneumonia in the right middle lobe area.  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 History of lung cancer. Ongoing chemotherapy. Initial encounter.  EXAM: CHEST  2 VIEW  COMPARISON:  Chest CT- 06/04/2014  FINDINGS: Grossly unchanged cardiac silhouette and mediastinal contours. Stable postsurgical change of the right hilum with associated right-sided volume loss  and chronic trace right-sided pleural effusion. Grossly unchanged right basilar heterogeneous opacities. No new focal airspace opacities. No evidence of edema. No pneumothorax. Unchanged bones.  IMPRESSION: 1.  No acute cardiopulmonary disease. 2. Stable postsurgical change of the right lung with volume loss and chronic trace right-sided effusion.   Electronically Signed   By: Sandi Mariscal M.D.   On: 09/07/2014 19:08   Ct Chest W Contrast  09/07/2014   CLINICAL DATA:  Pleural effusion. Evaluate for underlying pneumonia due to arthralgias, cough, and fever. History of right-sided lung cancer treated surgically and with radiation therapy. Initial encounter  EXAM: CT CHEST WITH CONTRAST  TECHNIQUE: Multidetector CT imaging of the chest was performed during intravenous contrast administration.  CONTRAST:  64mL OMNIPAQUE IOHEXOL 300 MG/ML  SOLN  COMPARISON:  06/04/2014  FINDINGS: THORACIC INLET/BODY WALL:  No acute abnormality.  Porta catheter from the left, tip at the distal SVC. The left lower IJ and brachiocephalic vein appear chronically attenuated.  MEDIASTINUM:  Normal heart size. No pericardial effusion. No acute vascular findings. Chronic enlargement of the right anterior juxtadiaphragmatic lymph node, size similar to 11/22/2013.  LUNG WINDOWS:  Stable post treatment changes on the right, which chain sutures related to lower lobe resection. Nodularity and perihilar volume loss and opacification the stable over multiple examinations, and most compatible with radiation fibrosis. There is however new perihilar ground-glass density at the right base, suspicious for early infection given the clinical circumstances. Narrowing and distortion of the right middle lobe bronchi is chronic. No evidence of gross, obstructive mass lesion. Chronic, small right pleural effusion with stable thin posterior pleural enhancement.  UPPER ABDOMEN:  No acute findings.  OSSEOUS:  Remote posterior right sixth and seventh rib fractures.   IMPRESSION: 1. Right middle  lobe pneumonia. 2. 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 3. Chronic small right pleural effusion.   Electronically Signed   By: Jorje Guild M.D.   On: 09/07/2014 21:34     Code Status: Full Family Communication: Pt at bedside Disposition Plan: Admit for further evaluation     Review of Systems:  Constitutional: Negative for diaphoresis.  HENT: Negative for hearing loss, ear pain, nosebleeds, congestion, sore throat, neck pain, tinnitus and ear discharge.   Eyes: Negative for blurred vision, double vision, photophobia, pain, discharge and redness.  Respiratory: Per history of present illness Cardiovascular: Negative for chest pain, palpitations, orthopnea, claudication and leg swelling.  Gastrointestinal: Negative for nausea, vomiting and abdominal pain. Negative for heartburn, constipation, blood in stool and melena.  Genitourinary: Negative for dysuria, urgency, frequency, hematuria and flank pain.  Musculoskeletal: Negative for myalgias, back pain, joint pain and falls.  Skin: Negative for itching and rash.  Neurological: Negative for dizziness and weakness.  Endo/Heme/Allergies: Negative for environmental allergies and polydipsia. Does not bruise/bleed easily.  Psychiatric/Behavioral: Negative for suicidal ideas. The patient is not nervous/anxious.      Past Medical History  Diagnosis Date  . Lung cancer     right lower lobe adenocarcinoma  . History of radiation therapy 05/02/11 to 06/08/11    right lung    Past Surgical History  Procedure Laterality Date  . Lung lobectomy  04/18/2009    RLL  . Portacath placement  02/29/2012    Procedure: INSERTION PORT-A-CATH;  Surgeon: Nicanor Alcon, MD;  Location: [facility];  Service: Thoracic;  Laterality: Left;  PowerPort 8 F attachable in left internal jugular.    Social History:  reports that she has never smoked. She has never used smokeless tobacco. She reports that she does not drink alcohol  or use illicit drugs.  Allergies  Allergen Reactions  . Codeine Nausea Only  . Doxycycline Nausea And Vomiting    vomiting and headache    Family History  Problem Relation Age of Onset  . Cancer Neg Hx   . Heart Problems Mother     Prior to Admission medications   Medication Sig Start Date End Date Taking? Authorizing Provider  albuterol (PROVENTIL HFA;VENTOLIN HFA) 108 (90 BASE) MCG/ACT inhaler Inhale 2 puffs into the lungs every 6 (six) hours as needed for wheezing. 04/02/13  Yes Curt Bears, MD  clindamycin (CLEOCIN T) 1 % lotion Apply topically 2 (two) times daily. 06/09/14  Yes Curt Bears, MD  erlotinib (TARCEVA) 150 MG tablet Take 1 tablet (150 mg total) by mouth daily. 07/28/14  Yes Curt Bears, MD  fluticasone (CUTIVATE) 0.05 % cream Apply 1 application topically daily.  10/23/12  Yes Historical Provider, MD  lidocaine-prilocaine (EMLA) cream Apply to Port-A-Cath prior to tx PRN 11/25/13  Yes Curt Bears, MD  naproxen sodium (ANAPROX) 220 MG tablet Take 440 mg by mouth 2 (two) times daily with a meal.   Yes Historical Provider, MD    Physical Exam: Filed Vitals:   09/07/14 2033 09/07/14 2042 09/07/14 2200 09/07/14 2231  BP: 94/56  99/71 92/61  Pulse: 103  93 95  Temp:  99.3 F (37.4 C)    TempSrc:  Oral    Resp: 16   18  SpO2: 96%  97% 98%    Physical Exam  Constitutional: Appears well-developed and well-nourished. No distress.  HENT: Normocephalic. External right and left ear normal. Oropharynx is clear and moist.  Eyes: Conjunctivae and EOM are normal. PERRLA, no scleral  icterus.  Neck: Normal ROM. Neck supple. No JVD. No tracheal deviation. No thyromegaly.  CVS: RRR, S1/S2 +, no murmurs, no gallops, no carotid bruit.  Pulmonary: Effort and breath sounds normal, no stridor, rales on the right side Abdominal: Soft. BS +,  no distension, tenderness, rebound or guarding.  Musculoskeletal: Normal range of motion. No edema and no tenderness.   Lymphadenopathy: No lymphadenopathy noted, cervical, inguinal. Neuro: Alert. Normal reflexes, muscle tone coordination. No cranial nerve deficit. Skin: Skin is warm and dry. No rash noted. Not diaphoretic. No erythema. No pallor.  Psychiatric: Normal mood and affect. Behavior, judgment, thought content normal.   Labs on Admission:  Basic Metabolic Panel:  Recent Labs Lab 09/07/14 1828  NA 143  K 4.1  CL 102  CO2 27  GLUCOSE 94  BUN 6  CREATININE 0.44*  CALCIUM 9.0   CBC:  Recent Labs Lab 09/07/14 1828  WBC 11.5*  NEUTROABS 9.0*  HGB 11.5*  HCT 35.3*  MCV 86.1  PLT 297   EKG: Normal sinus rhythm, no ST/T wave changes  Faye Ramsay, MD  [facility] Pager [phone]  If 7PM-7AM, please contact night-coverage [email or web address] Password [email or web address] 09/07/2014, 10:58 PM

## 2014-09-08 ENCOUNTER — Ambulatory Visit (HOSPITAL_COMMUNITY): Payer: 59

## 2014-09-08 ENCOUNTER — Other Ambulatory Visit: Payer: 59

## 2014-09-08 DIAGNOSIS — C3431 Malignant neoplasm of lower lobe, right bronchus or lung: Secondary | ICD-10-CM

## 2014-09-08 DIAGNOSIS — J189 Pneumonia, unspecified organism: Principal | ICD-10-CM

## 2014-09-08 DIAGNOSIS — D638 Anemia in other chronic diseases classified elsewhere: Secondary | ICD-10-CM

## 2014-09-08 LAB — STREP PNEUMONIAE URINARY ANTIGEN: Strep Pneumo Urinary Antigen: NEGATIVE

## 2014-09-08 LAB — CBC
HCT: 31.7 % — ABNORMAL LOW (ref 36.0–46.0)
Hemoglobin: 10.8 g/dL — ABNORMAL LOW (ref 12.0–15.0)
MCH: 29.3 pg (ref 26.0–34.0)
MCHC: 34.1 g/dL (ref 30.0–36.0)
MCV: 86.1 fL (ref 78.0–100.0)
Platelets: 276 10*3/uL (ref 150–400)
RBC: 3.68 MIL/uL — ABNORMAL LOW (ref 3.87–5.11)
RDW: 14.6 % (ref 11.5–15.5)
WBC: 10.1 10*3/uL (ref 4.0–10.5)

## 2014-09-08 LAB — LEGIONELLA ANTIGEN, URINE

## 2014-09-08 LAB — BASIC METABOLIC PANEL
Anion gap: 11 (ref 5–15)
BUN: 5 mg/dL — ABNORMAL LOW (ref 6–23)
CO2: 27 mEq/L (ref 19–32)
Calcium: 8.5 mg/dL (ref 8.4–10.5)
Chloride: 103 mEq/L (ref 96–112)
Creatinine, Ser: 0.44 mg/dL — ABNORMAL LOW (ref 0.50–1.10)
GFR calc Af Amer: >90 mL/min (ref >90)
GFR calc non Af Amer: >90 mL/min (ref >90)
Glucose, Bld: 113 mg/dL — ABNORMAL HIGH (ref 70–99)
Potassium: 3.9 mEq/L (ref 3.7–5.3)
Sodium: 141 mEq/L (ref 137–147)

## 2014-09-08 LAB — I-STAT CG4 LACTIC ACID, ED: Lactic Acid, Venous: 1.04 mmol/L (ref 0.5–2.2)

## 2014-09-08 MED ORDER — NAPROXEN 500 MG PO TABS
500.0000 mg | ORAL_TABLET | Freq: Two times a day (BID) | ORAL | Status: DC
  Administered 2014-09-08 – 2014-09-09 (×4): 500 mg via ORAL
  Filled 2014-09-08 (×5): qty 1

## 2014-09-08 MED ORDER — VANCOMYCIN HCL IN DEXTROSE 750-5 MG/150ML-% IV SOLN
750.0000 mg | Freq: Two times a day (BID) | INTRAVENOUS | Status: DC
  Administered 2014-09-08 – 2014-09-09 (×3): 750 mg via INTRAVENOUS
  Filled 2014-09-08 (×3): qty 150

## 2014-09-08 MED ORDER — DEXTROSE 5 % IV SOLN
1.0000 g | Freq: Three times a day (TID) | INTRAVENOUS | Status: DC
  Administered 2014-09-08 – 2014-09-09 (×4): 1 g via INTRAVENOUS
  Filled 2014-09-08 (×4): qty 1

## 2014-09-08 MED ORDER — GUAIFENESIN-DM 100-10 MG/5ML PO SYRP
10.0000 mL | ORAL_SOLUTION | ORAL | Status: DC | PRN
  Administered 2014-09-08: 10 mL via ORAL
  Filled 2014-09-08: qty 10

## 2014-09-08 MED ORDER — ALBUTEROL SULFATE HFA 108 (90 BASE) MCG/ACT IN AERS: 2.0000 | INHALATION_SPRAY | Freq: Four times a day (QID) | RESPIRATORY_TRACT | Status: DC | PRN

## 2014-09-08 MED ORDER — SODIUM CHLORIDE 0.9 % IV SOLN
INTRAVENOUS | Status: DC
  Administered 2014-09-08: 02:00:00 via INTRAVENOUS

## 2014-09-08 MED ORDER — ALBUTEROL SULFATE (2.5 MG/3ML) 0.083% IN NEBU: 2.5000 mg | INHALATION_SOLUTION | RESPIRATORY_TRACT | Status: DC | PRN

## 2014-09-08 MED ORDER — OXYCODONE-ACETAMINOPHEN 5-325 MG PO TABS: 1.0000 | ORAL_TABLET | ORAL | Status: DC | PRN

## 2014-09-08 MED ORDER — ENOXAPARIN SODIUM 40 MG/0.4ML ~~LOC~~ SOLN
40.0000 mg | SUBCUTANEOUS | Status: DC
  Administered 2014-09-08 – 2014-09-09 (×2): 40 mg via SUBCUTANEOUS
  Filled 2014-09-08 (×2): qty 0.4

## 2014-09-08 MED ORDER — ERLOTINIB HCL 150 MG PO TABS
150.0000 mg | ORAL_TABLET | Freq: Every day | ORAL | Status: DC
  Administered 2014-09-08 – 2014-09-09 (×2): 150 mg via ORAL
  Filled 2014-09-08 (×2): qty 1

## 2014-09-08 MED ORDER — NAPROXEN SODIUM 275 MG PO TABS: 440.0000 mg | ORAL_TABLET | Freq: Two times a day (BID) | ORAL | Status: DC

## 2014-09-08 MED ORDER — GUAIFENESIN ER 600 MG PO TB12
600.0000 mg | ORAL_TABLET | Freq: Two times a day (BID) | ORAL | Status: DC
  Administered 2014-09-08 (×2): 600 mg via ORAL
  Filled 2014-09-08 (×3): qty 1

## 2014-09-08 MED ORDER — ONDANSETRON HCL 4 MG/2ML IJ SOLN: 4.0000 mg | Freq: Four times a day (QID) | INTRAMUSCULAR | Status: DC | PRN

## 2014-09-08 NOTE — Progress Notes (Signed)
ANTIBIOTIC CONSULT NOTE - INITIAL  Pharmacy Consult for Vancomycin Indication: pneumonia  Allergies  Allergen Reactions  . Codeine Nausea Only  . Doxycycline Nausea And Vomiting    vomiting and headache    Patient Measurements: Height: 5' (152.4 cm) Weight: 128 lb 8.5 oz (58.3 kg) IBW/kg (Calculated) : 45.5  Vital Signs: Temp: 98.2 F (36.8 C) (10/12 0045) Temp Source: Oral (10/12 0045) BP: 92/70 mmHg (10/11 2330) Pulse Rate: 93 (10/11 2330) Intake/Output from previous day:   Intake/Output from this shift:    Labs:  Recent Labs  09/07/14 1828  WBC 11.5*  HGB 11.5*  PLT 297  CREATININE 0.44*   Estimated Creatinine Clearance: 65.7 ml/min (by C-G formula based on Cr of 0.44). No results found for this basename: VANCOTROUGH, Corlis Leak, VANCORANDOM, [place], GENTPEAK, GENTRANDOM, TOBRATROUGH, TOBRAPEAK, TOBRARND, AMIKACINPEAK, AMIKACINTROU, AMIKACIN,  in the last 72 hours   Microbiology: Recent Results (from the past 720 hour(s))  RAPID STREP SCREEN     Status: None   Collection Time    09/07/14  7:55 PM      Result Value Ref Range Status   Streptococcus, Group A Screen (Direct) NEGATIVE  NEGATIVE Final   Comment: (NOTE)     A Rapid Antigen test may result negative if the antigen level in the     sample is below the detection level of this test. The FDA has not     cleared this test as a stand-alone test therefore the rapid antigen     negative result has reflexed to a Group A Strep culture.    Medical History: Past Medical History  Diagnosis Date  . Lung cancer     right lower lobe adenocarcinoma  . History of radiation therapy 05/02/11 to 06/08/11    right lung    Medications:  Scheduled:  . ceFEPime (MAXIPIME) IV  1 g Intravenous 3 times per day  . enoxaparin (LOVENOX) injection  40 mg Subcutaneous Q24H  . erlotinib  150 mg Oral Daily  . naproxen  500 mg Oral BID WC   Infusions:  . sodium chloride     Assessment:  53 yr female with h/o lung  cancer presents with complaint of shortness of breath  CT Chest concerning for pneumonia  Received Levofloxacin 750mg  IV x 1 in ED @ 22:03 on 10/11.  Upon admission, pt to continue antibiotics with Cefepime 1gm IV q8h x 8 days and Vancomycin per pharmacy dosing x 8 days.  Pharmacy requested to adjust antibiotics based on renal function  CrCl ~ 65 ml/min  Blood, urine and sputum cultures ordered  Goal of Therapy:  Vancomycin trough level 15-20 mcg/ml  Plan:  Measure antibiotic drug levels at steady state Follow up culture results  Continue Cefepime 1gm IV q8h x 8 days, as ordered by MD  Vancomycin 750mg  IV q12h x 8 days  Arelys Glassco, Toribio Harbour, PharmD 09/08/2014,1:20 AM

## 2014-09-08 NOTE — Progress Notes (Addendum)
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  Denies fevers, chills, nausea, abdominal pain this morning.  Denies SOB.    Objective: Filed Vitals:   09/08/14 0045 09/08/14 0105 09/08/14 0125 09/08/14 0501  BP:   104/63 98/66  Pulse:   97 91  Temp: 98.2 F (36.8 C)  98.1 F (36.7 C) 98.5 F (36.9 C)  TempSrc: Oral  Oral Oral  Resp:   18 18  Height:  5' (1.524 m) 4\' 5"  (1.346 m)   Weight:  58.3 kg (128 lb 8.5 oz) 59.557 kg (131 lb 4.8 oz)   SpO2:   98% 99%    Intake/Output Summary (Last 24 hours) at 09/08/14 0913 Last data filed at 09/08/14 0700  Gross per 24 hour  Intake    590 ml  Output   1350 ml  Net   -760 ml   Filed Weights   09/08/14 0105 09/08/14 0125  Weight: 58.3 kg (128 lb 8.5 oz) 59.557 kg (131 lb 4.8 oz)    Exam:   General:  Thin F, No  acute distress  HEENT:  NCAT, MMM  Cardiovascular:  RRR, nl S1, S2 no mrg, 2+ pulses, warm extremities  Respiratory:  Diminished BS at the right base to teh right mid-back, no wheezes or rhonchi.  no increased WOB  Abdomen:   NABS, soft, NT/ND  MSK:   Normal tone and bulk, no LEE  Neuro:  Grossly intact  Data Reviewed: Basic Metabolic Panel:  Recent Labs Lab 09/07/14 1828 09/08/14 0420  NA 143 141  K 4.1 3.9  CL 102 103  CO2 27 27  GLUCOSE 94 113*  BUN 6 5*  CREATININE 0.44* 0.44*  CALCIUM 9.0 8.5   Liver Function Tests: No results found for this basename: AST, ALT, ALKPHOS, BILITOT, PROT, ALBUMIN,  in the last 168 hours No results found for this basename: LIPASE, AMYLASE,  in the last 168 hours No results found for this basename: AMMONIA,  in the last 168 hours CBC:  Recent Labs Lab 09/07/14 1828 09/08/14 0420  WBC 11.5* 10.1  NEUTROABS 9.0*  --   HGB 11.5* 10.8*  HCT 35.3* 31.7*  MCV 86.1 86.1  PLT 297 276   Cardiac Enzymes: No results found for this basename: CKTOTAL, CKMB, CKMBINDEX, TROPONINI,  in the last 168 hours BNP (last 3 results) No results  found for this basename: PROBNP,  in the last 8760 hours CBG: No results found for this basename: GLUCAP,  in the last 168 hours  Recent Results (from the past 240 hour(s))  RAPID STREP SCREEN     Status: None   Collection Time    09/07/14  7:55 PM      Result Value Ref Range Status   Streptococcus, Group A Screen (Direct) NEGATIVE  NEGATIVE Final   Comment: (NOTE)     A Rapid Antigen test may result negative if the antigen level in the     sample is below the detection level of this test. The FDA has not     cleared this test as a stand-alone test therefore the rapid antigen     negative result has reflexed to a Group A Strep culture.     Studies: Dg Chest 2 View  09/07/2014   CLINICAL DATA:  Generalized body aches, fever and cough for few days. History of lung cancer. Ongoing chemotherapy. Initial  encounter.  EXAM: CHEST  2 VIEW  COMPARISON:  Chest CT- 06/04/2014  FINDINGS: Grossly unchanged cardiac silhouette and mediastinal contours. Stable postsurgical change of the right hilum with associated right-sided volume loss and chronic trace right-sided pleural effusion. Grossly unchanged right basilar heterogeneous opacities. No new focal airspace opacities. No evidence of edema. No pneumothorax. Unchanged bones.  IMPRESSION: 1.  No acute cardiopulmonary disease. 2. Stable postsurgical change of the right lung with volume loss and chronic trace right-sided effusion.   Electronically Signed   By: Sandi Mariscal M.D.   On: 09/07/2014 19:08   Ct Chest W Contrast  09/07/2014   CLINICAL DATA:  Pleural effusion. Evaluate for underlying pneumonia due to arthralgias, cough, and fever. History of right-sided lung cancer treated surgically and with radiation therapy. Initial encounter  EXAM: CT CHEST WITH CONTRAST  TECHNIQUE: Multidetector CT imaging of the chest was performed during intravenous contrast administration.  CONTRAST:  39mL OMNIPAQUE IOHEXOL 300 MG/ML  SOLN  COMPARISON:  06/04/2014  FINDINGS: THORACIC INLET/BODY WALL:  No acute abnormality.  Porta catheter from the left, tip at the distal SVC. The left lower IJ and brachiocephalic vein appear chronically attenuated.  MEDIASTINUM:  Normal heart size. No pericardial effusion. No acute vascular findings. Chronic enlargement of the right anterior juxtadiaphragmatic lymph node, size similar to 11/22/2013.  LUNG WINDOWS:  Stable post treatment changes on the right, which chain sutures related to lower lobe resection. Nodularity and perihilar volume loss and opacification the stable over multiple examinations, and most compatible with radiation fibrosis. There is however new perihilar ground-glass density at the right base, suspicious for early infection given the clinical circumstances. Narrowing and distortion of the right middle lobe bronchi is chronic. No  evidence of gross, obstructive mass lesion. Chronic, small right pleural effusion with stable thin posterior pleural enhancement.  UPPER ABDOMEN:  No acute findings.  OSSEOUS:  Remote posterior right sixth and seventh rib fractures.  IMPRESSION: 1. Right middle lobe pneumonia. 2. 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 3. Chronic small right pleural effusion.   Electronically Signed   By: Jorje Guild M.D.   On: 09/07/2014 21:34    Scheduled Meds: . ceFEPime (MAXIPIME) IV  1 g Intravenous 3 times per day  . enoxaparin (LOVENOX) injection  40 mg Subcutaneous Q24H  . erlotinib  150 mg Oral Daily  . guaiFENesin  600 mg Oral BID  . naproxen  500 mg Oral BID WC  . vancomycin  750  mg Intravenous Q12H   Continuous Infusions: . sodium chloride 75 mL/hr at 09/08/14 0148    Active Problems:   HCAP (healthcare-associated pneumonia)    Time spent: 30 min    Jordyan Hardiman, [facility] Pager [phone]. If 7PM-7AM, please contact night-coverage at [email or web address], password [email or web address] 09/08/2014, 9:13 AM  LOS: 1 day

## 2014-09-08 NOTE — Progress Notes (Signed)
[name]   DOB:[birth year]   RX#:[id number]   PJK#:[id number]  Patient Care Team: Curt Bears, MD as PCP - General (Hematology and Oncology) Marye Round, MD (Radiation Oncology)  Subjective: 53 year old woman with a history of locally recurrent of non-small cell lung cancer, adenocarcinoma as described below, on Tarceva, admitted with increasing shortness of breath, fevers, chills, decreased appetite without significant weight loss,  and intermittent nausea. chest x ray demonstrated possible right middle lobe pneumonia. CT of the chest confirmed the diagnosis, along with some  right perihilar radiation fibrosis. Labs demonstrated mild, likely reactive leukocytosis. Cultures were obtained and she was placed on broad spectrum IV antibiotics with Vanco and Maxipime. She denies any bleeding issues. She is feeling better this morning, with less shortness of breath, minimal cough, no pain and appetite improved.  We were kindly informed of the patient's admission.    Oncological History:  PRINCIPAL DIAGNOSIS: Local recurrence of non-small cell lung cancer, adenocarcinoma, initially diagnosed as stage IB (T2a N0 M0) in March of 2010.   PRIOR THERAPY:  1. Status post right lower lobectomy under the care of Dr. Cyndia Bent on 04/08/2009. 2. Status post palliative radiotherapy to the right lower lobe recurrent lung mass under the care of Dr. Lisbeth Renshaw, completed on June 08, 2011. 3. Systemic chemotherapy with carboplatin for AUC of 5 and Alimta 500 mg/M2 every 3 weeks. She is status post 3 cycles. 4.  CURRENT THERAPY: Tarceva 150 mg by mouth daily, therapy beginning 07/24/2012. Status post approximately 22 months of therapy.   CHEMOTHERAPY INTENT: Palliative   CURRENT # OF CHEMOTHERAPY CYCLES: 22   Scheduled Meds: . ceFEPime (MAXIPIME) IV  1 g Intravenous 3 times per day  . enoxaparin (LOVENOX) injection  40 mg Subcutaneous Q24H  . erlotinib  150 mg Oral Daily  . guaiFENesin  600 mg Oral BID  .  naproxen  500 mg Oral BID WC  . vancomycin  750 mg Intravenous Q12H   Continuous Infusions: . sodium chloride 75 mL/hr at 09/08/14 0148   PRN Meds:albuterol, ondansetron (ZOFRAN) IV, oxyCODONE-acetaminophen   Objective:  Filed Vitals:   09/08/14 0501  BP: 98/66  Pulse: 91  Temp: 98.5 F (36.9 C)  Resp: 18      Intake/Output Summary (Last 24 hours) at 09/08/14 0839 Last data filed at 09/08/14 0700  Gross per 24 hour  Intake    590 ml  Output   1350 ml  Net   -760 ml    ECOG PERFORMANCE STATUS: 1  GENERAL:alert, no distress and comfortable SKIN: skin color, texture, turgor are normal, no rashes or significant lesions EYES: normal, conjunctiva are pink and non-injected, sclera clear OROPHARYNX:no exudate, no erythema and lips, buccal mucosa, and tongue normal  NECK: supple, thyroid normal size, non-tender, without nodularity LYMPH:  no palpable lymphadenopathy in the cervical, axillary or inguinal LUNGS:right expiratory rhonchi, minimal wheezing, left lung clear  HEART: regular rate & rhythm and no murmurs and no lower extremity edema ABDOMEN:abdomen soft, non-tender and normal bowel sounds Musculoskeletal:no cyanosis of digits and no clubbing  PSYCH: alert & oriented x 3 with fluent speech NEURO: no focal motor/sensory deficits    CBG (last 3)  No results found for this basename: GLUCAP,  in the last 72 hours   Labs:   Recent Labs Lab 09/07/14 1828 09/08/14 0420  WBC 11.5* 10.1  HGB 11.5* 10.8*  HCT 35.3* 31.7*  PLT 297 276  MCV 86.1 86.1  MCH 28.0 29.3  MCHC 32.6 34.1  RDW 14.4 14.6  LYMPHSABS 1.6  --   MONOABS 0.8  --   EOSABS 0.1  --   BASOSABS 0.0  --      Chemistries:    Recent Labs Lab 09/07/14 1828 09/08/14 0420  NA 143 141  K 4.1 3.9  CL 102 103  CO2 27 27  GLUCOSE 94 113*  BUN 6 5*  CREATININE 0.44* 0.44*  CALCIUM 9.0 8.5    Urine Studies     Component Value Date/Time   COLORURINE YELLOW 09/07/2014 1925   APPEARANCEUR  CLEAR 09/07/2014 1925   LABSPEC 1.008 09/07/2014 1925   PHURINE 7.0 09/07/2014 1925   GLUCOSEU NEGATIVE 09/07/2014 1925   HGBUR NEGATIVE 09/07/2014 1925   BILIRUBINUR NEGATIVE 09/07/2014 1925   KETONESUR NEGATIVE 09/07/2014 1925   PROTEINUR NEGATIVE 09/07/2014 1925   UROBILINOGEN 1.0 09/07/2014 1925   NITRITE NEGATIVE 09/07/2014 1925   LEUKOCYTESUR LARGE* 09/07/2014 1925     Recent Results (from the past 240 hour(s))  RAPID STREP SCREEN     Status: None   Collection Time    09/07/14  7:55 PM      Result Value Ref Range Status   Streptococcus, Group A Screen (Direct) NEGATIVE  NEGATIVE Final   Comment: (NOTE)     A Rapid Antigen test may result negative if the antigen level in the     sample is below the detection level of this test. The FDA has not     cleared this test as a stand-alone test therefore the rapid antigen     negative result has reflexed to a Group A Strep culture.       Imaging Studies:  Dg Chest 2 View  09/07/2014   CLINICAL DATA:  Generalized body aches, fever and cough for few days. History of lung cancer. Ongoing chemotherapy. Initial encounter.  EXAM: CHEST  2 VIEW  COMPARISON:  Chest CT- 06/04/2014  FINDINGS: Grossly unchanged cardiac silhouette and mediastinal contours. Stable postsurgical change of the right hilum with associated right-sided volume loss and chronic trace right-sided pleural effusion. Grossly unchanged right basilar heterogeneous opacities. No new focal airspace opacities. No evidence of edema. No pneumothorax. Unchanged bones.  IMPRESSION: 1.  No acute cardiopulmonary disease. 2. Stable postsurgical change of the right lung with volume loss and chronic trace right-sided effusion.   Electronically Signed   By: Sandi Mariscal M.D.   On: 09/07/2014 19:08   Ct Chest W Contrast  09/07/2014   CLINICAL DATA:  Pleural effusion. Evaluate for underlying pneumonia due to arthralgias, cough, and fever. History of right-sided lung cancer treated surgically  and with radiation therapy. Initial encounter  EXAM: CT CHEST WITH CONTRAST  TECHNIQUE: Multidetector CT imaging of the chest was performed during intravenous contrast administration.  CONTRAST:  91mL OMNIPAQUE IOHEXOL 300 MG/ML  SOLN  COMPARISON:  06/04/2014  FINDINGS: THORACIC INLET/BODY WALL:  No acute abnormality.  Porta catheter from the left, tip at the distal SVC. The left lower IJ and brachiocephalic vein appear chronically attenuated.  MEDIASTINUM:  Normal heart size. No pericardial effusion. No acute vascular findings. Chronic enlargement of the right anterior juxtadiaphragmatic lymph node, size similar to 11/22/2013.  LUNG WINDOWS:  Stable post treatment changes on the right, which chain sutures related to lower lobe resection. Nodularity and perihilar volume loss and opacification the stable over multiple examinations, and most compatible with radiation fibrosis. There is however new perihilar ground-glass density at the right base, suspicious for early infection given the clinical circumstances. Narrowing  and distortion of the right middle lobe bronchi is chronic. No evidence of gross, obstructive mass lesion. Chronic, small right pleural effusion with stable thin posterior pleural enhancement.  UPPER ABDOMEN:  No acute findings.  OSSEOUS:  Remote posterior right sixth and seventh rib fractures.  IMPRESSION: 1. Right middle lobe pneumonia. 2. 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 3. Chronic small right pleural effusion.   Electronically Signed   By: Jorje Guild M.D.   On: 09/07/2014 21:34    Assessment/Plan: 53 y.o.  Asian female with  1.Recurrent non-small cell lung cancer, adenocarcinoma.  She is currently on treatment with oral Tarceva 150 mg by mouth daily status post 22 cycles of treatment.  She is tolerating her treatment with Tarceva fairly well.  CT scan of the chest for restaging of her disease is stable without acute findings, but does show right perihilar radiation  fibrosis. Continue Tarceva dose as directed  2.  Possible Right middle lobe pneumonia 3. Acute hypoxic respiratory failure Appreciate primary team involvement This is due to infection, with possibe She is on IV Vanco and Maxipime day 2 after cultures were obtained, including sputum and legionella; strep pneumo cultures negative agree with ,O2 and albuterol  4. Fever 5. Leukocytosis Likely reactive due to infection, inflammation On IV antibiotics, antipyretics. Cultures pending. White count back to baseline today.  6. DVT Prophylaxis On Lovenox  7. Full Code  Other medical issues as per admitting team     **Disclaimer: This note was dictated with voice recognition software. Similar sounding words can inadvertently be transcribed and this note may contain transcription errors which may not have been corrected upon publication of note.Sharene Butters E, PA-C 09/08/2014  8:39 AM  ADDENDUM: Hematology/Oncology Attending: The patient is seen and examined today. I agree with the above note. This is a very pleasant 52 years old Asian female with history of recurrent non-small cell lung cancer, adenocarcinoma currently on treatment with oral Tarceva 150 mg by mouth daily for the last 22 months and tolerating her treatment well. She was admitted with fever and chills and CT scan of the chest showed evidence for her right middle lung pneumonia. She is currently on treatment with IV vancomycin and Maxipime. Her symptoms has improved and the patient is feeling much better. I recommended for her to continue with the current treatment and she can be switched to oral Levaquin or Avelox before discharge home. She will continue her treatment with Tarceva as scheduled. She has a followup appointment with [place] next week for reevaluation and discussion of her treatment options based on the recent CT scan results. Thank you for taking good care of Ms. Rasmusson. I will continue to follow the patient with you  and assist in her management an as-needed basis.

## 2014-09-09 ENCOUNTER — Other Ambulatory Visit: Payer: Self-pay | Admitting: *Deleted

## 2014-09-09 DIAGNOSIS — C3491 Malignant neoplasm of unspecified part of right bronchus or lung: Secondary | ICD-10-CM

## 2014-09-09 LAB — BASIC METABOLIC PANEL
Anion gap: 10 (ref 5–15)
BUN: 8 mg/dL (ref 6–23)
CO2: 28 mEq/L (ref 19–32)
Calcium: 8.7 mg/dL (ref 8.4–10.5)
Chloride: 102 mEq/L (ref 96–112)
Creatinine, Ser: 0.46 mg/dL — ABNORMAL LOW (ref 0.50–1.10)
GFR calc Af Amer: >90 mL/min (ref >90)
GFR calc non Af Amer: >90 mL/min (ref >90)
Glucose, Bld: 125 mg/dL — ABNORMAL HIGH (ref 70–99)
Potassium: 3.9 mEq/L (ref 3.7–5.3)
Sodium: 140 mEq/L (ref 137–147)

## 2014-09-09 LAB — CULTURE, GROUP A STREP

## 2014-09-09 LAB — URINE CULTURE
Colony Count: NO GROWTH
Culture: NO GROWTH

## 2014-09-09 LAB — CBC
HCT: 33.3 % — ABNORMAL LOW (ref 36.0–46.0)
Hemoglobin: 10.6 g/dL — ABNORMAL LOW (ref 12.0–15.0)
MCH: 28 pg (ref 26.0–34.0)
MCHC: 31.8 g/dL (ref 30.0–36.0)
MCV: 88.1 fL (ref 78.0–100.0)
Platelets: 304 10*3/uL (ref 150–400)
RBC: 3.78 MIL/uL — ABNORMAL LOW (ref 3.87–5.11)
RDW: 14.6 % (ref 11.5–15.5)
WBC: 7.8 10*3/uL (ref 4.0–10.5)

## 2014-09-09 MED ORDER — BENZONATATE 100 MG PO CAPS
200.0000 mg | ORAL_CAPSULE | Freq: Three times a day (TID) | ORAL | Status: DC
  Administered 2014-09-09 (×2): 200 mg via ORAL
  Filled 2014-09-09 (×2): qty 2

## 2014-09-09 MED ORDER — LEVOFLOXACIN 750 MG PO TABS
750.0000 mg | ORAL_TABLET | Freq: Every day | ORAL | Status: DC
  Administered 2014-09-09: 750 mg via ORAL
  Filled 2014-09-09: qty 1

## 2014-09-09 MED ORDER — BENZONATATE 200 MG PO CAPS: 200.0000 mg | ORAL_CAPSULE | Freq: Three times a day (TID) | ORAL | Status: DC

## 2014-09-09 MED ORDER — ALBUTEROL SULFATE HFA 108 (90 BASE) MCG/ACT IN AERS: 2.0000 | INHALATION_SPRAY | Freq: Four times a day (QID) | RESPIRATORY_TRACT | Status: DC | PRN

## 2014-09-09 MED ORDER — LEVOFLOXACIN 750 MG PO TABS: 750.0000 mg | ORAL_TABLET | Freq: Every day | ORAL | Status: DC

## 2014-09-09 MED ORDER — HYDROCOD POLST-CHLORPHEN POLST 10-8 MG/5ML PO LQCR: 5.0000 mL | Freq: Two times a day (BID) | ORAL | Status: DC | PRN

## 2014-09-09 MED ORDER — CLINDAMYCIN PHOSPHATE 1 % EX LOTN: TOPICAL_LOTION | Freq: Two times a day (BID) | CUTANEOUS | Status: DC

## 2014-09-09 NOTE — ED Provider Notes (Signed)
Medical screening examination/treatment/procedure(s) were performed by non-physician practitioner and as supervising physician I was immediately available for consultation/collaboration.   EKG Interpretation None        Davyn Elsasser H Marckus Hanover, MD 09/09/14 1855 

## 2014-09-09 NOTE — Discharge Summary (Addendum)
Physician Discharge Summary  Delonda Coley MWN:[id number] DOB: May 19, 1961 DOA: 09/07/2014  PCP: [name], MD  Admit date: 09/07/2014 Discharge date: 09/09/2014  Recommendations for Outpatient Follow-up:  1. F/u with oncology in 1 week 2. Continue levofloxacin for 5 more days to complete a 7-day course of treatment for pneumonia  Discharge Diagnoses:  Active Problems:   Lung cancer   HCAP (healthcare-associated pneumonia)   Anemia of chronic disease   Discharge Condition: stable, improved  Diet recommendation: regular  Wt Readings from Last 3 Encounters:  09/08/14 59.557 kg (131 lb 4.8 oz)  08/11/14 58.287 kg (128 lb 8 oz)  07/14/14 58.378 kg (128 lb 11.2 oz)    History of present illness:  The patient is a 53 year old female with history of lung cancer he presented to the emergency department with progressive shortness of breath, dyspnea on exertion, fevers, chills, inability to eat secondary to nausea. In the emergency department, she was febrile to 100.9 Fahrenheit, and CT of the chest was worrisome for pneumonia the right middle lobe.  Hospital Course:   Acute hypoxic respiratory failure secondary to healthcare associated pneumonia. She was started on vancomycin and cefepime because of her immunosuppression from lung cancer and chemotherapy.  Urine lesional and strep pneumo antigens were negative. Blood cultures are no growth to date. Respiratory viral panel is pending. She was quickly able to graduate to room air and her fever trended down on antibiotics. On the date of discharge is afebrile without increased work of breathing and has been transitioned to levofloxacin to continue a full seven-day course of antibiotics. She will need to follow up with oncology next week.  Abnormal urinalysis with negative urine culture.  Non-small cell lung cancer, adenocarcinoma, on Tarceva.  She had stable perihilar radiation fibrosis on the right side which was noted on her CAT scan of  the chest.  Leukocytosis, resolved with antibiotics.  Mild normocytic anemia due to chronic disease and chemotherapy. Hemoglobin remained approximately stable.   Consultants:  Oncology Procedures:  CT chest RML pneumonia and stable perihilar fibrosis on right with chronic small right pleural effusion  CXR Antibiotics:  vanc 10/12 >>  cefepime 10/12 >>  Discharge Exam: Filed Vitals:   09/09/14 1500  BP: 114/75  Pulse: 97  Temp: 98.1 F (36.7 C)  Resp: 18   Filed Vitals:   09/08/14 2140 09/09/14 0531 09/09/14 0546 09/09/14 1500  BP: 110/69 125/77 112/73 114/75  Pulse: 89 97 84 97  Temp: 99.1 F (37.3 C) 98.1 F (36.7 C) 98.1 F (36.7 C) 98.1 F (36.7 C)  TempSrc: Oral Oral Oral Oral  Resp: 15 14 18 18   Height:      Weight:      SpO2: 99% 99% 100% 100%    General: Thin F, No acute distress  HEENT: NCAT, MMM  Cardiovascular: RRR, nl S1, S2 no mrg, 2+ pulses, warm extremities  Respiratory: Diminished BS at the right base to teh right mid-back, no wheezes or rhonchi. no increased WOB  Abdomen: NABS, soft, NT/ND  MSK: Normal tone and bulk, no LEE  Neuro: Grossly intact   Discharge Instructions      Discharge Instructions   Call MD for:  difficulty breathing, headache or visual disturbances    Complete by:  As directed      Call MD for:  extreme fatigue    Complete by:  As directed      Call MD for:  hives    Complete by:  As directed  Call MD for:  persistant dizziness or light-headedness    Complete by:  As directed      Call MD for:  persistant nausea and vomiting    Complete by:  As directed      Call MD for:  severe uncontrolled pain    Complete by:  As directed      Call MD for:  temperature >100.4    Complete by:  As directed      Diet general    Complete by:  As directed      Discharge instructions    Complete by:  As directed   You were hospitalized with pneumonia.  Please take levofloxacin once a day, next dose tomorrow, until all the  pills are gone, even if you are feeling better.  If you develop worsening shortness of breath or cough or fever > 100.71F, please return to the emergency department.  If you have questions, you can call my office [phone] (Dr. Sheran Fava) or call the Oncology office [phone] (Dr. Julien Nordmann).     Increase activity slowly    Complete by:  As directed             Medication List         albuterol 108 (90 BASE) MCG/ACT inhaler  Commonly known as:  PROVENTIL HFA;VENTOLIN HFA  Inhale 2 puffs into the lungs every 6 (six) hours as needed for wheezing.     benzonatate 200 MG capsule  Commonly known as:  TESSALON  Take 1 capsule (200 mg total) by mouth 3 (three) times daily.     chlorpheniramine-HYDROcodone 10-8 MG/5ML Lqcr  Commonly known as:  TUSSIONEX  Take 5 mLs by mouth every 12 (twelve) hours as needed for cough.     clindamycin 1 % lotion  Commonly known as:  CLEOCIN T  Apply topically 2 (two) times daily.     erlotinib 150 MG tablet  Commonly known as:  TARCEVA  Take 1 tablet (150 mg total) by mouth daily.     fluticasone 0.05 % cream  Commonly known as:  CUTIVATE  Apply 1 application topically daily.     levofloxacin 750 MG tablet  Commonly known as:  LEVAQUIN  Take 1 tablet (750 mg total) by mouth daily.     lidocaine-prilocaine cream  Commonly known as:  EMLA  Apply to Port-A-Cath prior to tx PRN     naproxen sodium 220 MG tablet  Commonly known as:  ANAPROX  Take 440 mg by mouth 2 (two) times daily with a meal.       Follow-up Information   Follow up with [facility] K., MD. Schedule an appointment as soon as possible for a visit in 1 week.   Specialty:  Oncology   Contact information:   [address] [place] [address] [phone]        The results of significant diagnostics from this hospitalization (including imaging, microbiology, ancillary and laboratory) are listed below for reference.    Significant Diagnostic Studies: Dg Chest 2  View  09/07/2014   CLINICAL DATA:  Generalized body aches, fever and cough for few days. History of lung cancer. Ongoing chemotherapy. Initial encounter.  EXAM: CHEST  2 VIEW  COMPARISON:  Chest CT- 06/04/2014  FINDINGS: Grossly unchanged cardiac silhouette and mediastinal contours. Stable postsurgical change of the right hilum with associated right-sided volume loss and chronic trace right-sided pleural effusion. Grossly unchanged right basilar heterogeneous opacities. No new focal airspace opacities. No evidence of edema. No  pneumothorax. Unchanged bones.  IMPRESSION: 1.  No acute cardiopulmonary disease. 2. Stable postsurgical change of the right lung with volume loss and chronic trace right-sided effusion.   Electronically Signed   By: Sandi Mariscal M.D.   On: 09/07/2014 19:08   Ct Chest W Contrast  09/07/2014   CLINICAL DATA:  Pleural effusion. Evaluate for underlying pneumonia due to arthralgias, cough, and fever. History of right-sided lung cancer treated surgically and with radiation therapy. Initial encounter  EXAM: CT CHEST WITH CONTRAST  TECHNIQUE: Multidetector CT imaging of the chest was performed during intravenous contrast administration.  CONTRAST:  68mL OMNIPAQUE IOHEXOL 300 MG/ML  SOLN  COMPARISON:  06/04/2014  FINDINGS: THORACIC INLET/BODY WALL:  No acute abnormality.  Porta catheter from the left, tip at the distal SVC. The left lower IJ and brachiocephalic vein appear chronically attenuated.  MEDIASTINUM:  Normal heart size. No pericardial effusion. No acute vascular findings. Chronic enlargement of the right anterior juxtadiaphragmatic lymph node, size similar to 11/22/2013.  LUNG WINDOWS:  Stable post treatment changes on the right, which chain sutures related to lower lobe resection. Nodularity and perihilar volume loss and opacification the stable over multiple examinations, and most compatible with radiation fibrosis. There is however new perihilar ground-glass density at the right  base, suspicious for early infection given the clinical circumstances. Narrowing and distortion of the right middle lobe bronchi is chronic. No evidence of gross, obstructive mass lesion. Chronic, small right pleural effusion with stable thin posterior pleural enhancement.  UPPER ABDOMEN:  No acute findings.  OSSEOUS:  Remote posterior right sixth and seventh rib fractures.  IMPRESSION: 1. Right middle lobe pneumonia. 2. 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 3. Chronic small right pleural effusion.   Electronically Signed   By: Jorje Guild M.D.   On: 09/07/2014 21:34    Microbiology: Recent Results (from the past 240 hour(s))  URINE CULTURE     Status: None   Collection Time    09/07/14  7:26 PM      Result Value Ref Range Status   Specimen Description URINE, CATHETERIZED   Final   Special Requests NONE   Final   Culture  Setup Time     Final   Value: 09/08/2014 08:45     Performed at [facility] PENDING   Incomplete   Culture     Final   Value: Culture reincubated for better growth     Performed at [facility]   Report Status PENDING   Incomplete  RAPID STREP SCREEN     Status: None   Collection Time    09/07/14  7:55 PM      Result Value Ref Range Status   Streptococcus, Group A Screen (Direct) NEGATIVE  NEGATIVE Final   Comment: (NOTE)     A Rapid Antigen test may result negative if the antigen level in the     sample is below the detection level of this test. The FDA has not     cleared this test as a stand-alone test therefore the rapid antigen     negative result has reflexed to a Group A Strep culture.  CULTURE, GROUP A STREP     Status: None   Collection Time    09/07/14  7:55 PM      Result Value Ref Range Status   Specimen Description THROAT   Final   Special Requests NONE   Final   Culture  Final   Value: No Beta Hemolytic Streptococci Isolated     Performed at [facility]   Report Status 09/09/2014 FINAL    Final  CULTURE, BLOOD (ROUTINE X 2)     Status: None   Collection Time    09/07/14 10:42 PM      Result Value Ref Range Status   Specimen Description BLOOD LEFT HAND   Final   Special Requests BOTTLES DRAWN AEROBIC AND ANAEROBIC 5CC   Final   Culture  Setup Time     Final   Value: 09/08/2014 02:37     Performed at [facility]   Culture     Final   Value:        BLOOD CULTURE RECEIVED NO GROWTH TO DATE CULTURE WILL BE HELD FOR 5 DAYS BEFORE ISSUING A FINAL NEGATIVE REPORT     Performed at [facility]   Report Status PENDING   Incomplete  CULTURE, BLOOD (ROUTINE X 2)     Status: None   Collection Time    09/07/14 10:45 PM      Result Value Ref Range Status   Specimen Description BLOOD RIGHT HAND   Final   Special Requests BOTTLES DRAWN AEROBIC AND ANAEROBIC 5CC   Final   Culture  Setup Time     Final   Value: 09/08/2014 02:37     Performed at [facility]   Culture     Final   Value:        BLOOD CULTURE RECEIVED NO GROWTH TO DATE CULTURE WILL BE HELD FOR 5 DAYS BEFORE ISSUING A FINAL NEGATIVE REPORT     Performed at [facility]   Report Status PENDING   Incomplete  URINE CULTURE     Status: None   Collection Time    09/08/14  1:08 AM      Result Value Ref Range Status   Specimen Description URINE, CLEAN CATCH   Final   Special Requests NONE   Final   Culture  Setup Time     Final   Value: 09/08/2014 10:33     Performed at [facility]     Final   Value: NO GROWTH     Performed at [facility]   Culture     Final   Value: NO GROWTH     Performed at [facility]   Report Status 09/09/2014 FINAL   Final     Labs: Basic Metabolic Panel:  Recent Labs Lab 09/07/14 1828 09/08/14 0420 09/09/14 0410  NA 143 141 140  K 4.1 3.9 3.9  CL 102 103 102  CO2 27 27 28   GLUCOSE 94 113* 125*  BUN 6 5* 8  CREATININE 0.44* 0.44* 0.46*  CALCIUM 9.0 8.5 8.7   Liver Function Tests: No results found for this  basename: AST, ALT, ALKPHOS, BILITOT, PROT, ALBUMIN,  in the last 168 hours No results found for this basename: LIPASE, AMYLASE,  in the last 168 hours No results found for this basename: AMMONIA,  in the last 168 hours CBC:  Recent Labs Lab 09/07/14 1828 09/08/14 0420 09/09/14 0410  WBC 11.5* 10.1 7.8  NEUTROABS 9.0*  --   --   HGB 11.5* 10.8* 10.6*  HCT 35.3* 31.7* 33.3*  MCV 86.1 86.1 88.1  PLT 297 276 304   Cardiac Enzymes: No results found for this basename: CKTOTAL, CKMB, CKMBINDEX, TROPONINI,  in the last 168 hours BNP:  BNP (last 3 results) No results found for this basename: PROBNP,  in the last 8760 hours CBG: No results found for this basename: GLUCAP,  in the last 168 hours  Time coordinating discharge: 45 minutes  Signed:  Shaquanda Graves  [facility] 09/09/2014, 5:32 PM

## 2014-09-09 NOTE — Progress Notes (Signed)
ANTIBIOTIC CONSULT NOTE - INITIAL  Pharmacy Consult for Levaquin Indication: pneumonia  Allergies  Allergen Reactions  . Codeine Nausea Only  . Doxycycline Nausea And Vomiting    vomiting and headache    Patient Measurements: Height: 4\' 5"  (134.6 cm) Weight: 131 lb 4.8 oz (59.557 kg) IBW/kg (Calculated) : 29.4  Vital Signs: Temp: 98.1 F (36.7 C) (10/13 0546) Temp Source: Oral (10/13 0546) BP: 112/73 mmHg (10/13 0546) Pulse Rate: 84 (10/13 0546) Intake/Output from previous day: 10/12 0701 - 10/13 0700 In: 1530 [P.O.:1080; IV Piggyback:450] Out: 2300 [Urine:2300] Intake/Output from this shift:    Labs:  Recent Labs  09/07/14 1828 09/08/14 0420 09/09/14 0410  WBC 11.5* 10.1 7.8  HGB 11.5* 10.8* 10.6*  PLT 297 276 304  CREATININE 0.44* 0.44* 0.46*   Estimated Creatinine Clearance: 53.9 ml/min (by C-G formula based on Cr of 0.46). No results found for this basename: Letta Median, VANCORANDOM, [place], GENTPEAK, GENTRANDOM, TOBRATROUGH, TOBRAPEAK, TOBRARND, AMIKACINPEAK, AMIKACINTROU, AMIKACIN,  in the last 72 hours   Microbiology: Recent Results (from the past 720 hour(s))  URINE CULTURE     Status: None   Collection Time    09/07/14  7:26 PM      Result Value Ref Range Status   Specimen Description URINE, CATHETERIZED   Final   Special Requests NONE   Final   Culture  Setup Time     Final   Value: 09/08/2014 08:45     Performed at [facility] PENDING   Incomplete   Culture     Final   Value: Culture reincubated for better growth     Performed at [facility]   Report Status PENDING   Incomplete  RAPID STREP SCREEN     Status: None   Collection Time    09/07/14  7:55 PM      Result Value Ref Range Status   Streptococcus, Group A Screen (Direct) NEGATIVE  NEGATIVE Final   Comment: (NOTE)     A Rapid Antigen test may result negative if the antigen level in the     sample is below the detection level of this  test. The FDA has not     cleared this test as a stand-alone test therefore the rapid antigen     negative result has reflexed to a Group A Strep culture.  CULTURE, GROUP A STREP     Status: None   Collection Time    09/07/14  7:55 PM      Result Value Ref Range Status   Specimen Description THROAT   Final   Special Requests NONE   Final   Culture     Final   Value: NO SUSPICIOUS COLONIES, CONTINUING TO HOLD     Performed at [facility]   Report Status PENDING   Incomplete  CULTURE, BLOOD (ROUTINE X 2)     Status: None   Collection Time    09/07/14 10:42 PM      Result Value Ref Range Status   Specimen Description BLOOD LEFT HAND   Final   Special Requests BOTTLES DRAWN AEROBIC AND ANAEROBIC 5CC   Final   Culture  Setup Time     Final   Value: 09/08/2014 02:37     Performed at [facility]   Culture     Final   Value:        BLOOD CULTURE RECEIVED NO GROWTH TO DATE CULTURE WILL BE HELD FOR 5  DAYS BEFORE ISSUING A FINAL NEGATIVE REPORT     Performed at [facility]   Report Status PENDING   Incomplete  CULTURE, BLOOD (ROUTINE X 2)     Status: None   Collection Time    09/07/14 10:45 PM      Result Value Ref Range Status   Specimen Description BLOOD RIGHT HAND   Final   Special Requests BOTTLES DRAWN AEROBIC AND ANAEROBIC 5CC   Final   Culture  Setup Time     Final   Value: 09/08/2014 02:37     Performed at [facility]   Culture     Final   Value:        BLOOD CULTURE RECEIVED NO GROWTH TO DATE CULTURE WILL BE HELD FOR 5 DAYS BEFORE ISSUING A FINAL NEGATIVE REPORT     Performed at [facility]   Report Status PENDING   Incomplete  URINE CULTURE     Status: None   Collection Time    09/08/14  1:08 AM      Result Value Ref Range Status   Specimen Description URINE, CLEAN CATCH   Final   Special Requests NONE   Final   Culture  Setup Time     Final   Value: 09/08/2014 10:33     Performed at [place]      Final   Value: NO GROWTH     Performed at [facility]   Culture     Final   Value: NO GROWTH     Performed at [facility]   Report Status 09/09/2014 FINAL   Final    Medical History: Past Medical History  Diagnosis Date  . Lung cancer     right lower lobe adenocarcinoma  . History of radiation therapy 05/02/11 to 06/08/11    right lung    Medications:  Scheduled:  . benzonatate  200 mg Oral TID  . enoxaparin (LOVENOX) injection  40 mg Subcutaneous Q24H  . erlotinib  150 mg Oral Daily  . naproxen  500 mg Oral BID WC   Infusions:    Assessment: 53 yr female with h/o lung cancer presents with complaint of shortness of breath. CT Chest concerning for pneumonia. Received Levofloxacin 750mg  IV x 1 in ED @ 22:03 on 10/11. Upon admission, pt to continue antibiotics with Cefepime 1gm IV q8h x 8 days and Vancomycin per pharmacy dosing x 8 days on 10/12. Pharmacy requested to adjust antibiotics based on renal function. Vanc/Cefepime d/c'd 10/13 and to start Levaquin per pharmacy dosing  10/11 >> Levaquin x 1 10/12 >> vanc >> 10/13 10/12 >> cefepime >> 10/13 10/13 >> Levaquin >>   Afebrile  WBC WNL, down  SCr stable, CrCl 54 ml/min  Cultures NGTD   Goal of Therapy:  treatment of PNA  Plan:  Levaquin 750mg  PO daily for CrCl > 50 ml/min   Adrian Saran, PharmD, BCPS Pager [phone] 09/09/2014 10:42 AM

## 2014-09-10 LAB — RESPIRATORY VIRUS PANEL
Adenovirus: NOT DETECTED
Influenza A H1: NOT DETECTED
Influenza A H3: NOT DETECTED
Influenza A: NOT DETECTED
Influenza B: NOT DETECTED
Metapneumovirus: NOT DETECTED
Parainfluenza 1: NOT DETECTED
Parainfluenza 2: NOT DETECTED
Parainfluenza 3: NOT DETECTED
Respiratory Syncytial Virus A: NOT DETECTED
Respiratory Syncytial Virus B: NOT DETECTED
Rhinovirus: NOT DETECTED

## 2014-09-11 LAB — URINE CULTURE: Colony Count: >=100000

## 2014-09-14 LAB — CULTURE, BLOOD (ROUTINE X 2)
Culture: NO GROWTH
Culture: NO GROWTH

## 2014-09-15 ENCOUNTER — Ambulatory Visit (HOSPITAL_BASED_OUTPATIENT_CLINIC_OR_DEPARTMENT_OTHER): Payer: 59 | Admitting: Internal Medicine

## 2014-09-15 ENCOUNTER — Encounter: Payer: Self-pay | Admitting: Internal Medicine

## 2014-09-15 ENCOUNTER — Telehealth: Payer: Self-pay | Admitting: Internal Medicine

## 2014-09-15 VITALS — BP 119/81 | HR 96 | Temp 98.4°F | Resp 18 | Ht <= 58 in | Wt 127.7 lb

## 2014-09-15 DIAGNOSIS — R197 Diarrhea, unspecified: Secondary | ICD-10-CM

## 2014-09-15 DIAGNOSIS — C3431 Malignant neoplasm of lower lobe, right bronchus or lung: Secondary | ICD-10-CM

## 2014-09-15 DIAGNOSIS — J150 Pneumonia due to Klebsiella pneumoniae: Secondary | ICD-10-CM

## 2014-09-15 DIAGNOSIS — R21 Rash and other nonspecific skin eruption: Secondary | ICD-10-CM

## 2014-09-15 DIAGNOSIS — D638 Anemia in other chronic diseases classified elsewhere: Secondary | ICD-10-CM

## 2014-09-15 DIAGNOSIS — J189 Pneumonia, unspecified organism: Secondary | ICD-10-CM

## 2014-09-15 NOTE — Telephone Encounter (Signed)
gv adn printed appt sched and avs for pt for NOV

## 2014-09-15 NOTE — Progress Notes (Signed)
[place] [phone]   [phone]  OFFICE PROGRESS NOTE  [name], MD [place] [address]  PRINCIPAL DIAGNOSIS: Local recurrence of non-small cell lung cancer, adenocarcinoma, initially diagnosed as stage IB (T2a N0 M0) in March of 2010.   PRIOR THERAPY:  1. Status post right lower lobectomy under the care of Dr. Cyndia Bent on 04/08/2009. 2. Status post palliative radiotherapy to the right lower lobe recurrent lung mass under the care of Dr. Lisbeth Renshaw, completed on June 08, 2011. 3. Systemic chemotherapy with carboplatin for AUC of 5 and Alimta 500 mg/M2 every 3 weeks. She is status post 3 cycles.  CURRENT THERAPY: Tarceva 150 mg by mouth daily, therapy beginning 07/24/2012. Status post approximately 22 months of therapy.   CHEMOTHERAPY INTENT: Palliative  CURRENT # OF CHEMOTHERAPY CYCLES: 23 CURRENT ANTIEMETICS: Compazine  CURRENT SMOKING STATUS: Nonsmoker  ORAL CHEMOTHERAPY AND CONSENT: Tarceva  CURRENT BISPHOSPHONATES USE: None  PAIN MANAGEMENT: None  NARCOTICS INDUCED CONSTIPATION: None  LIVING WILL AND CODE STATUS: Full code  INTERVAL HISTORY: [name] 53 y.o. female returns to the clinic today for follow up visit accompanied by her interpreter. She was recently admitted to [facility] with pneumonia of the right middle lobe. A urine culture showed Klebsiella pneumonia as well as Morganella morganii sensitive to Levaquin. The patient completed a course of Levaquin after discharge from the hospital. She is feeling good today except for occasional diarrhea. She continues to have skin rash on the eyebrows. She denied having any significant chest pain, shortness of breath,  no hemoptysis. The patient denied having any significant weight loss or night sweats. She denied having any nausea or vomiting , fever or chills. She is tolerating her treatment with Tarceva fairly well. She had CT scan of the chest performed  during her hospitalization that showed no evidence for disease progression.   MEDICAL HISTORY: Past Medical History  Diagnosis Date  . Lung cancer     right lower lobe adenocarcinoma  . History of radiation therapy 05/02/11 to 06/08/11    right lung    ALLERGIES:  is allergic to codeine and doxycycline.  MEDICATIONS:  Current Outpatient Prescriptions  Medication Sig Dispense Refill  . albuterol (PROVENTIL HFA;VENTOLIN HFA) 108 (90 BASE) MCG/ACT inhaler Inhale 2 puffs into the lungs every 6 (six) hours as needed for wheezing.  1 Inhaler  0  . benzonatate (TESSALON) 200 MG capsule Take 1 capsule (200 mg total) by mouth 3 (three) times daily.  20 capsule  0  . chlorpheniramine-HYDROcodone (TUSSIONEX) 10-8 MG/5ML LQCR Take 5 mLs by mouth every 12 (twelve) hours as needed for cough.  115 mL  0  . clindamycin (CLEOCIN T) 1 % lotion Apply topically 2 (two) times daily.  60 mL  0  . erlotinib (TARCEVA) 150 MG tablet Take 1 tablet (150 mg total) by mouth daily.  30 tablet  1  . fluticasone (CUTIVATE) 0.05 % cream Apply 1 application topically daily.       Marland Kitchen levofloxacin (LEVAQUIN) 750 MG tablet Take 1 tablet (750 mg total) by mouth daily.  5 tablet  0  . lidocaine-prilocaine (EMLA) cream Apply to Port-A-Cath prior to tx PRN  5 g  prn 1 year  . naproxen sodium (ANAPROX) 220 MG tablet Take 440 mg by mouth 2 (two) times daily with a meal.       No current facility-administered medications for this visit.    SURGICAL HISTORY:  Past Surgical History  Procedure Laterality Date  . Lung lobectomy  04/18/2009    RLL  . Portacath placement  02/29/2012    Procedure: INSERTION PORT-A-CATH;  Surgeon: Nicanor Alcon, MD;  Location: [place];  Service: Thoracic;  Laterality: Left;  PowerPort 8 F attachable in left internal jugular.    REVIEW OF SYSTEMS:  Constitutional: negative Eyes: negative Ears, nose, mouth, throat, and face: negative Respiratory: negative Cardiovascular: negative Gastrointestinal:  negative Genitourinary:negative Integument/breast: negative Hematologic/lymphatic: negative Musculoskeletal:negative Neurological: negative Behavioral/Psych: negative Endocrine: negative Allergic/Immunologic: negative   PHYSICAL EXAMINATION: General appearance: alert, cooperative and no distress Head: Normocephalic, without obvious abnormality, atraumatic Neck: no adenopathy, no JVD, supple, symmetrical, trachea midline and thyroid not enlarged, symmetric, no tenderness/mass/nodules Lymph nodes: Cervical, supraclavicular, and axillary nodes normal. Resp: wheezes bilaterally Back: symmetric, no curvature. ROM normal. No CVA tenderness. Cardio: regular rate and rhythm, S1, S2 normal, no murmur, click, rub or gallop GI: soft, non-tender; bowel sounds normal; no masses,  no organomegaly Extremities: extremities normal, atraumatic, no cyanosis or edema Neurologic: Alert and oriented X 3, normal strength and tone. Normal symmetric reflexes. Normal coordination and gait  ECOG PERFORMANCE STATUS: 1 - Symptomatic but completely ambulatory  Blood pressure 119/81, pulse 96, temperature 98.4 F (36.9 C), temperature source Oral, resp. rate 18, height 4\' 5"  (1.346 m), weight 127 lb 11.2 oz (57.924 kg), SpO2 99.00%.  LABORATORY DATA: Lab Results  Component Value Date   WBC 7.8 09/09/2014   HGB 10.6* 09/09/2014   HCT 33.3* 09/09/2014   MCV 88.1 09/09/2014   PLT 304 09/09/2014      Chemistry      Component Value Date/Time   NA 140 09/09/2014 0410   NA 142 08/11/2014 0912   NA 141 01/06/2012 0804   K 3.9 09/09/2014 0410   K 4.3 08/11/2014 0912   K 4.6 01/06/2012 0804   CL 102 09/09/2014 0410   CL 102 04/30/2013 1002   CL 98 01/06/2012 0804   CO2 28 09/09/2014 0410   CO2 26 08/11/2014 0912   CO2 28 01/06/2012 0804   BUN 8 09/09/2014 0410   BUN 9.0 08/11/2014 0912   BUN 10 01/06/2012 0804   CREATININE 0.46* 09/09/2014 0410   CREATININE 0.7 08/11/2014 0912   CREATININE 0.6 01/06/2012 0804        Component Value Date/Time   CALCIUM 8.7 09/09/2014 0410   CALCIUM 9.5 08/11/2014 0912   CALCIUM 8.9 01/06/2012 0804   ALKPHOS 93 08/11/2014 0912   ALKPHOS 93 07/12/2012 1121   ALKPHOS 64 01/06/2012 0804   AST 32 08/11/2014 0912   AST 44* 07/12/2012 1121   AST 27 01/06/2012 0804   ALT 35 08/11/2014 0912   ALT 49* 07/12/2012 1121   ALT 24 01/06/2012 0804   BILITOT 0.93 08/11/2014 0912   BILITOT 0.3 07/12/2012 1121   BILITOT 0.60 01/06/2012 0804       RADIOGRAPHIC STUDIES: Dg Chest 2 View  09/07/2014   CLINICAL DATA:  Generalized body aches, fever and cough for few days. History of lung cancer. Ongoing chemotherapy. Initial encounter.  EXAM: CHEST  2 VIEW  COMPARISON:  Chest CT- 06/04/2014  FINDINGS: Grossly unchanged cardiac silhouette and mediastinal contours. Stable postsurgical change of the right hilum with associated right-sided volume loss and chronic trace right-sided pleural effusion. Grossly unchanged right basilar heterogeneous opacities. No new focal airspace opacities. No evidence of edema. No pneumothorax. Unchanged bones.  IMPRESSION: 1.  No acute cardiopulmonary disease. 2. Stable postsurgical change of  the right lung with volume loss and chronic trace right-sided effusion.   Electronically Signed   By: Sandi Mariscal M.D.   On: 09/07/2014 19:08   Ct Chest W Contrast  09/07/2014   CLINICAL DATA:  Pleural effusion. Evaluate for underlying pneumonia due to arthralgias, cough, and fever. History of right-sided lung cancer treated surgically and with radiation therapy. Initial encounter  EXAM: CT CHEST WITH CONTRAST  TECHNIQUE: Multidetector CT imaging of the chest was performed during intravenous contrast administration.  CONTRAST:  39mL OMNIPAQUE IOHEXOL 300 MG/ML  SOLN  COMPARISON:  06/04/2014  FINDINGS: THORACIC INLET/BODY WALL:  No acute abnormality.  Porta catheter from the left, tip at the distal SVC. The left lower IJ and brachiocephalic vein appear chronically attenuated.  MEDIASTINUM:   Normal heart size. No pericardial effusion. No acute vascular findings. Chronic enlargement of the right anterior juxtadiaphragmatic lymph node, size similar to 11/22/2013.  LUNG WINDOWS:  Stable post treatment changes on the right, which chain sutures related to lower lobe resection. Nodularity and perihilar volume loss and opacification the stable over multiple examinations, and most compatible with radiation fibrosis. There is however new perihilar ground-glass density at the right base, suspicious for early infection given the clinical circumstances. Narrowing and distortion of the right middle lobe bronchi is chronic. No evidence of gross, obstructive mass lesion. Chronic, small right pleural effusion with stable thin posterior pleural enhancement.  UPPER ABDOMEN:  No acute findings.  OSSEOUS:  Remote posterior right sixth and seventh rib fractures.  IMPRESSION: 1. Right middle lobe pneumonia. 2. 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 3. Chronic small right pleural effusion.   Electronically Signed   By: Jorje Guild M.D.   On: 09/07/2014 21:34   ASSESSMENT AND PLAN: This is a very pleasant 54 years old Asian female with recurrent non-small cell lung cancer, adenocarcinoma. She is currently on treatment with oral Tarceva 150 mg by mouth daily status post 21 months of treatment. She is tolerating her treatment with Tarceva fairly well.  The recent CT scan of the chest showed no evidence for disease progression. I recommended for the patient to continue with the current treatment. For diarrhea, she will continue on Imodium on as-needed basis. For the bilateral wheezes, advice the patient will continue with her albuterol inhaler. She was advised to call immediately if she has any concerning symptoms in the interval. The patient voices understanding of current disease status and treatment options and is in agreement with the current care plan.  All questions were answered. The patient knows  to call the clinic with any problems, questions or concerns. We can certainly see the patient much sooner if necessary.  Disclaimer: This note was dictated with voice recognition software. Similar sounding words can inadvertently be transcribed and may not be corrected upon review.

## 2014-10-03 ENCOUNTER — Other Ambulatory Visit: Payer: Self-pay | Admitting: *Deleted

## 2014-10-03 NOTE — Telephone Encounter (Signed)
THIS REFILL REQUEST FOR TARCEVA WAS GIVEN TO DR.[name]'S NURSE, [place]

## 2014-10-05 ENCOUNTER — Other Ambulatory Visit: Payer: Self-pay | Admitting: Internal Medicine

## 2014-10-06 ENCOUNTER — Other Ambulatory Visit: Payer: Self-pay | Admitting: Medical Oncology

## 2014-10-06 DIAGNOSIS — C3491 Malignant neoplasm of unspecified part of right bronchus or lung: Secondary | ICD-10-CM

## 2014-10-06 MED ORDER — ERLOTINIB HCL 150 MG PO TABS: 150.0000 mg | ORAL_TABLET | Freq: Every day | ORAL | Status: DC

## 2014-10-13 ENCOUNTER — Encounter: Payer: Self-pay | Admitting: Internal Medicine

## 2014-10-13 ENCOUNTER — Telehealth: Payer: Self-pay | Admitting: Internal Medicine

## 2014-10-13 ENCOUNTER — Other Ambulatory Visit (HOSPITAL_BASED_OUTPATIENT_CLINIC_OR_DEPARTMENT_OTHER): Payer: 59

## 2014-10-13 ENCOUNTER — Ambulatory Visit (HOSPITAL_BASED_OUTPATIENT_CLINIC_OR_DEPARTMENT_OTHER): Payer: 59

## 2014-10-13 ENCOUNTER — Ambulatory Visit (HOSPITAL_BASED_OUTPATIENT_CLINIC_OR_DEPARTMENT_OTHER): Payer: 59 | Admitting: Internal Medicine

## 2014-10-13 VITALS — BP 126/90 | HR 99 | Temp 98.2°F | Resp 18 | Ht <= 58 in | Wt 127.2 lb

## 2014-10-13 DIAGNOSIS — C3491 Malignant neoplasm of unspecified part of right bronchus or lung: Secondary | ICD-10-CM

## 2014-10-13 DIAGNOSIS — C3431 Malignant neoplasm of lower lobe, right bronchus or lung: Secondary | ICD-10-CM

## 2014-10-13 DIAGNOSIS — D638 Anemia in other chronic diseases classified elsewhere: Secondary | ICD-10-CM

## 2014-10-13 DIAGNOSIS — Z95828 Presence of other vascular implants and grafts: Secondary | ICD-10-CM

## 2014-10-13 DIAGNOSIS — Z23 Encounter for immunization: Secondary | ICD-10-CM

## 2014-10-13 DIAGNOSIS — R197 Diarrhea, unspecified: Secondary | ICD-10-CM

## 2014-10-13 LAB — CBC WITH DIFFERENTIAL/PLATELET
BASO%: 0.5 % (ref 0.0–2.0)
Basophils Absolute: 0 10*3/uL (ref 0.0–0.1)
EOS%: 1.5 % (ref 0.0–7.0)
Eosinophils Absolute: 0.1 10*3/uL (ref 0.0–0.5)
HCT: 40.5 % (ref 34.8–46.6)
HGB: 13.2 g/dL (ref 11.6–15.9)
LYMPH%: 23.6 % (ref 14.0–49.7)
MCH: 27.9 pg (ref 25.1–34.0)
MCHC: 32.5 g/dL (ref 31.5–36.0)
MCV: 85.8 fL (ref 79.5–101.0)
MONO#: 0.4 10*3/uL (ref 0.1–0.9)
MONO%: 5.2 % (ref 0.0–14.0)
NEUT#: 5.9 10*3/uL (ref 1.5–6.5)
NEUT%: 69.2 % (ref 38.4–76.8)
Platelets: 304 10*3/uL (ref 145–400)
RBC: 4.72 10*6/uL (ref 3.70–5.45)
RDW: 14 % (ref 11.2–14.5)
WBC: 8.6 10*3/uL (ref 3.9–10.3)
lymph#: 2 10*3/uL (ref 0.9–3.3)

## 2014-10-13 LAB — COMPREHENSIVE METABOLIC PANEL (CC13)
ALT: 18 U/L (ref 0–55)
AST: 19 U/L (ref 5–34)
Albumin: 3.7 g/dL (ref 3.5–5.0)
Alkaline Phosphatase: 86 U/L (ref 40–150)
Anion Gap: 8 mEq/L (ref 3–11)
BUN: 13.5 mg/dL (ref 7.0–26.0)
CO2: 27 mEq/L (ref 22–29)
Calcium: 9.3 mg/dL (ref 8.4–10.4)
Chloride: 105 mEq/L (ref 98–109)
Creatinine: 0.6 mg/dL (ref 0.6–1.1)
Glucose: 102 mg/dl (ref 70–140)
Potassium: 3.8 mEq/L (ref 3.5–5.1)
Sodium: 140 mEq/L (ref 136–145)
Total Bilirubin: 0.46 mg/dL (ref 0.20–1.20)
Total Protein: 7.3 g/dL (ref 6.4–8.3)

## 2014-10-13 MED ORDER — ALBUTEROL SULFATE HFA 108 (90 BASE) MCG/ACT IN AERS: 2.0000 | INHALATION_SPRAY | Freq: Four times a day (QID) | RESPIRATORY_TRACT | Status: DC | PRN

## 2014-10-13 MED ORDER — BENZONATATE 200 MG PO CAPS: 200.0000 mg | ORAL_CAPSULE | Freq: Three times a day (TID) | ORAL | Status: DC

## 2014-10-13 MED ORDER — HEPARIN SOD (PORK) LOCK FLUSH 100 UNIT/ML IV SOLN
500.0000 [IU] | Freq: Once | INTRAVENOUS | Status: AC
  Administered 2014-10-13: 500 [IU] via INTRAVENOUS
  Filled 2014-10-13: qty 5

## 2014-10-13 MED ORDER — INFLUENZA VAC SPLIT QUAD 0.5 ML IM SUSY
0.5000 mL | PREFILLED_SYRINGE | Freq: Once | INTRAMUSCULAR | Status: AC
Start: 2014-10-13 — End: 2014-10-13
  Administered 2014-10-13: 0.5 mL via INTRAMUSCULAR
  Filled 2014-10-13: qty 0.5

## 2014-10-13 MED ORDER — SODIUM CHLORIDE 0.9 % IJ SOLN
10.0000 mL | INTRAMUSCULAR | Status: DC | PRN
  Administered 2014-10-13: 10 mL via INTRAVENOUS
  Filled 2014-10-13: qty 10

## 2014-10-13 NOTE — Patient Instructions (Signed)

## 2014-10-13 NOTE — Telephone Encounter (Signed)
Gave avs & cal for Dec. °

## 2014-10-13 NOTE — Patient Instructions (Signed)
Avian Influenza Viruses Avian influenza or "bird flu" is also known as the H5N1 virus. It occurs naturally in wild and domestic birds. Bird flu is easily spread (contagious) among birds and is deadly to them. Though rare, bird flu can cause disease in humans.  CAUSES  Infected birds can shed the H5N1 virus through:   Feces.  Nasal secretions.  Saliva. Birds become infected when they come into contact with infected birds or contaminated surfaces. The bird flu virus is spread from country to country through international poultry trade or by migrating birds.  MODES OF TRANSMISSION TO HUMANS The bird flu virus does not normally infect humans. However, the virus can infect humans who have contact with infected birds, breathe in dust or touch surfaces contaminated with the virus. Human-to-human transmission of the H5N1 virus has been rare. The virus lacks the ability to grow itself (replicate) in humans. However, because all influenza viruses can mutate, scientists are concerned the H5N1 virus will someday replicate itself and make human-to-human transmission easier. If this happens, an influenza "pandemic" could occur.  SYMPTOMS   Symptoms of H5N1 virus are similar to other influenza viruses:  Fever.  Cough.  Sore throat.  Nausea and vomiting.  Diarrhea.  Muscle aches.  Tiredness (malaise).  Some people may get inflammation or redness of the eyes (conjunctivitis).  Life-threatening complications may result in the death of the patient. These include:  Viral pneumonia.  Breathing (respiratory) distress syndrome.  Multi-organ failure. DIAGNOSIS   A person with a respiratory illness may be suffering from bird flu if direct or indirect contact has been made with infected birds. This includes handling or taking care of sick birds. The H5N1 virus may also be suspected if a person has breathed in particles or touched surfaces contaminated with the virus.  In addition to the above  symptoms, a chest X-ray is useful to detect pneumonia.  Fluid specimens such as a sputum sample may be sent to a laboratory for further investigation.  Blood tests may be done to help detect the H5N1 virus. TREATMENT   The H5N1 virus has shown resistance to amantadine and rimantadine, which are two antiviral drugs commonly used for other influenza viruses. However, two other antivirals, oseltamivir and zanamivir, seem to be effective against the H5N1 strain.  If bird flu is suspected in a person, treatment should start immediately without waiting for laboratory confirmation.  Treatment for the H5N1 strain is essentially the same as treating other influenza viruses. PREVENTION   Stay home from work, school, and errands when you are sick. Not being in contact with other people will help stop the spread of illness.  Cover your mouth and nose with your arm when coughing or sneezing. This may help keep those around you from getting sick.  Wash your hands often with warm water and soap. Illnesses are often spread when a person touches something that is contaminated with germs and then touches his or her eyes, nose, or mouth.  Antiviral medications can help prevent the flu.  For optimal health, get plenty of rest, eat a healthy diet, and exercise. Document Released: 11/17/2003 Document Revised: 03/31/2014 Document Reviewed: 06/12/2008 Exit[employer] Patient Information 2015 [employer]. This information is not intended to replace advice given to you by your health care provider. Make sure you discuss any questions you have with your health care provider.  

## 2014-10-13 NOTE — Progress Notes (Signed)
[facility] [phone]   [phone]  OFFICE PROGRESS NOTE  [name], MD [place] [address]  PRINCIPAL DIAGNOSIS: Local recurrence of non-small cell lung cancer, adenocarcinoma, initially diagnosed as stage IB (T2a N0 M0) in March of 2010.   PRIOR THERAPY:  1. Status post right lower lobectomy under the care of Dr. Cyndia Bent on 04/08/2009. 2. Status post palliative radiotherapy to the right lower lobe recurrent lung mass under the care of Dr. Lisbeth Renshaw, completed on June 08, 2011. 3. Systemic chemotherapy with carboplatin for AUC of 5 and Alimta 500 mg/M2 every 3 weeks. She is status post 3 cycles.  CURRENT THERAPY: Tarceva 150 mg by mouth daily, therapy beginning 07/24/2012. Status post approximately 23 months of therapy.   CHEMOTHERAPY INTENT: Palliative  CURRENT # OF CHEMOTHERAPY CYCLES: 24 CURRENT ANTIEMETICS: Compazine  CURRENT SMOKING STATUS: Nonsmoker  ORAL CHEMOTHERAPY AND CONSENT: Tarceva  CURRENT BISPHOSPHONATES USE: None  PAIN MANAGEMENT: None  NARCOTICS INDUCED CONSTIPATION: None  LIVING WILL AND CODE STATUS: Full code  INTERVAL HISTORY: [name] 53 y.o. female returns to the clinic today for follow up visit accompanied by her interpreter. She is tolerating her treatment with Tarceva fairly well with no significant adverse effects except for the mild skin rash and infrequent episodes of diarrhea. She denied having any significant chest pain, shortness of breath,  no hemoptysis. The patient denied having any significant weight loss or night sweats. She denied having any nausea or vomiting , fever or chills.   MEDICAL HISTORY: Past Medical History  Diagnosis Date  . Lung cancer     right lower lobe adenocarcinoma  . History of radiation therapy 05/02/11 to 06/08/11    right lung    ALLERGIES:  is allergic to codeine and doxycycline.  MEDICATIONS:  Current Outpatient Prescriptions  Medication Sig  Dispense Refill  . albuterol (PROVENTIL HFA;VENTOLIN HFA) 108 (90 BASE) MCG/ACT inhaler Inhale 2 puffs into the lungs every 6 (six) hours as needed for wheezing. 1 Inhaler 0  . benzonatate (TESSALON) 200 MG capsule Take 1 capsule (200 mg total) by mouth 3 (three) times daily. 20 capsule 0  . chlorpheniramine-HYDROcodone (TUSSIONEX) 10-8 MG/5ML LQCR Take 5 mLs by mouth every 12 (twelve) hours as needed for cough. 115 mL 0  . clindamycin (CLEOCIN T) 1 % lotion APPLY TOPICALLY 2 (TWO) TIMES DAILY. 60 mL 0  . erlotinib (TARCEVA) 150 MG tablet Take 1 tablet (150 mg total) by mouth daily. 90 tablet 0  . fluticasone (CUTIVATE) 0.05 % cream Apply 1 application topically daily.     Marland Kitchen lidocaine-prilocaine (EMLA) cream Apply to Port-A-Cath prior to tx PRN 5 g prn 1 year  . naproxen sodium (ANAPROX) 220 MG tablet Take 440 mg by mouth 2 (two) times daily with a meal.     No current facility-administered medications for this visit.    SURGICAL HISTORY:  Past Surgical History  Procedure Laterality Date  . Lung lobectomy  04/18/2009    RLL  . Portacath placement  02/29/2012    Procedure: INSERTION PORT-A-CATH;  Surgeon: Nicanor Alcon, MD;  Location: [facility];  Service: Thoracic;  Laterality: Left;  PowerPort 8 F attachable in left internal jugular.    REVIEW OF SYSTEMS:  Constitutional: negative Eyes: negative Ears, nose, mouth, throat, and face: negative Respiratory: negative Cardiovascular: negative Gastrointestinal: negative Genitourinary:negative Integument/breast: negative Hematologic/lymphatic: negative Musculoskeletal:negative Neurological: negative Behavioral/Psych: negative Endocrine: negative Allergic/Immunologic: negative   PHYSICAL EXAMINATION: General  appearance: alert, cooperative and no distress Head: Normocephalic, without obvious abnormality, atraumatic Neck: no adenopathy, no JVD, supple, symmetrical, trachea midline and thyroid not enlarged, symmetric, no  tenderness/mass/nodules Lymph nodes: Cervical, supraclavicular, and axillary nodes normal. Resp: wheezes bilaterally Back: symmetric, no curvature. ROM normal. No CVA tenderness. Cardio: regular rate and rhythm, S1, S2 normal, no murmur, click, rub or gallop GI: soft, non-tender; bowel sounds normal; no masses,  no organomegaly Extremities: extremities normal, atraumatic, no cyanosis or edema Neurologic: Alert and oriented X 3, normal strength and tone. Normal symmetric reflexes. Normal coordination and gait  ECOG PERFORMANCE STATUS: 1 - Symptomatic but completely ambulatory  Blood pressure 126/90, pulse 99, temperature 98.2 F (36.8 C), temperature source Oral, resp. rate 18, height 4\' 5"  (1.346 m), weight 127 lb 3.2 oz (57.698 kg), SpO2 99 %.  LABORATORY DATA: Lab Results  Component Value Date   WBC 8.6 10/13/2014   HGB 13.2 10/13/2014   HCT 40.5 10/13/2014   MCV 85.8 10/13/2014   PLT 304 10/13/2014      Chemistry      Component Value Date/Time   NA 140 10/13/2014 0943   NA 140 09/09/2014 0410   NA 141 01/06/2012 0804   K 3.8 10/13/2014 0943   K 3.9 09/09/2014 0410   K 4.6 01/06/2012 0804   CL 102 09/09/2014 0410   CL 102 04/30/2013 1002   CL 98 01/06/2012 0804   CO2 27 10/13/2014 0943   CO2 28 09/09/2014 0410   CO2 28 01/06/2012 0804   BUN 13.5 10/13/2014 0943   BUN 8 09/09/2014 0410   BUN 10 01/06/2012 0804   CREATININE 0.6 10/13/2014 0943   CREATININE 0.46* 09/09/2014 0410   CREATININE 0.6 01/06/2012 0804      Component Value Date/Time   CALCIUM 9.3 10/13/2014 0943   CALCIUM 8.7 09/09/2014 0410   CALCIUM 8.9 01/06/2012 0804   ALKPHOS 86 10/13/2014 0943   ALKPHOS 93 07/12/2012 1121   ALKPHOS 64 01/06/2012 0804   AST 19 10/13/2014 0943   AST 44* 07/12/2012 1121   AST 27 01/06/2012 0804   ALT 18 10/13/2014 0943   ALT 49* 07/12/2012 1121   ALT 24 01/06/2012 0804   BILITOT 0.46 10/13/2014 0943   BILITOT 0.3 07/12/2012 1121   BILITOT 0.60 01/06/2012 0804        RADIOGRAPHIC STUDIES: No results found.  ASSESSMENT AND PLAN: This is a very pleasant 53 years old Asian female with recurrent non-small cell lung cancer, adenocarcinoma. She is currently on treatment with oral Tarceva 150 mg by mouth daily status post 23 months of treatment. She is tolerating her treatment with Tarceva fairly well.  I recommended for the patient to continue with the current treatment. For diarrhea, she will continue on Imodium on as-needed basis. She was advised to call immediately if she has any concerning symptoms in the interval. The patient voices understanding of current disease status and treatment options and is in agreement with the current care plan.  All questions were answered. The patient knows to call the clinic with any problems, questions or concerns. We can certainly see the patient much sooner if necessary.  Disclaimer: This note was dictated with voice recognition software. Similar sounding words can inadvertently be transcribed and may not be corrected upon review.

## 2014-11-03 ENCOUNTER — Other Ambulatory Visit: Payer: Self-pay | Admitting: Internal Medicine

## 2014-11-10 ENCOUNTER — Ambulatory Visit: Payer: 59

## 2014-11-10 ENCOUNTER — Telehealth: Payer: Self-pay | Admitting: Physician Assistant

## 2014-11-10 ENCOUNTER — Encounter: Payer: Self-pay | Admitting: Physician Assistant

## 2014-11-10 ENCOUNTER — Other Ambulatory Visit (HOSPITAL_BASED_OUTPATIENT_CLINIC_OR_DEPARTMENT_OTHER): Payer: 59

## 2014-11-10 ENCOUNTER — Ambulatory Visit (HOSPITAL_BASED_OUTPATIENT_CLINIC_OR_DEPARTMENT_OTHER): Payer: 59 | Admitting: Physician Assistant

## 2014-11-10 VITALS — BP 122/89 | HR 98 | Temp 98.3°F | Resp 19 | Ht <= 58 in | Wt 127.2 lb

## 2014-11-10 DIAGNOSIS — C3491 Malignant neoplasm of unspecified part of right bronchus or lung: Secondary | ICD-10-CM

## 2014-11-10 DIAGNOSIS — C3431 Malignant neoplasm of lower lobe, right bronchus or lung: Secondary | ICD-10-CM

## 2014-11-10 DIAGNOSIS — Z95828 Presence of other vascular implants and grafts: Secondary | ICD-10-CM

## 2014-11-10 DIAGNOSIS — R05 Cough: Secondary | ICD-10-CM

## 2014-11-10 DIAGNOSIS — R197 Diarrhea, unspecified: Secondary | ICD-10-CM

## 2014-11-10 DIAGNOSIS — R21 Rash and other nonspecific skin eruption: Secondary | ICD-10-CM

## 2014-11-10 LAB — COMPREHENSIVE METABOLIC PANEL (CC13)
ALT: 15 U/L (ref 0–55)
AST: 18 U/L (ref 5–34)
Albumin: 3.8 g/dL (ref 3.5–5.0)
Alkaline Phosphatase: 86 U/L (ref 40–150)
Anion Gap: 12 mEq/L — ABNORMAL HIGH (ref 3–11)
BUN: 11.6 mg/dL (ref 7.0–26.0)
CO2: 25 mEq/L (ref 22–29)
Calcium: 9 mg/dL (ref 8.4–10.4)
Chloride: 104 mEq/L (ref 98–109)
Creatinine: 0.7 mg/dL (ref 0.6–1.1)
EGFR: >90 mL/min/{1.73_m2} (ref >90)
Glucose: 96 mg/dl (ref 70–140)
Potassium: 3.8 mEq/L (ref 3.5–5.1)
Sodium: 140 mEq/L (ref 136–145)
Total Bilirubin: 0.4 mg/dL (ref 0.20–1.20)
Total Protein: 7.3 g/dL (ref 6.4–8.3)

## 2014-11-10 LAB — CBC WITH DIFFERENTIAL/PLATELET
BASO%: 0.1 % (ref 0.0–2.0)
Basophils Absolute: 0 10*3/uL (ref 0.0–0.1)
EOS%: 1.1 % (ref 0.0–7.0)
Eosinophils Absolute: 0.1 10*3/uL (ref 0.0–0.5)
HCT: 41.3 % (ref 34.8–46.6)
HGB: 13.5 g/dL (ref 11.6–15.9)
LYMPH%: 14.3 % (ref 14.0–49.7)
MCH: 28.1 pg (ref 25.1–34.0)
MCHC: 32.7 g/dL (ref 31.5–36.0)
MCV: 86 fL (ref 79.5–101.0)
MONO#: 0.7 10*3/uL (ref 0.1–0.9)
MONO%: 7 % (ref 0.0–14.0)
NEUT#: 7.5 10*3/uL — ABNORMAL HIGH (ref 1.5–6.5)
NEUT%: 77.5 % — ABNORMAL HIGH (ref 38.4–76.8)
Platelets: 284 10*3/uL (ref 145–400)
RBC: 4.8 10*6/uL (ref 3.70–5.45)
RDW: 13.7 % (ref 11.2–14.5)
WBC: 9.6 10*3/uL (ref 3.9–10.3)
lymph#: 1.4 10*3/uL (ref 0.9–3.3)
nRBC: 0 % (ref 0–0)

## 2014-11-10 MED ORDER — SODIUM CHLORIDE 0.9 % IJ SOLN
10.0000 mL | INTRAMUSCULAR | Status: DC | PRN
  Administered 2014-11-10: 10 mL via INTRAVENOUS
  Filled 2014-11-10: qty 10

## 2014-11-10 MED ORDER — HEPARIN SOD (PORK) LOCK FLUSH 100 UNIT/ML IV SOLN
500.0000 [IU] | Freq: Once | INTRAVENOUS | Status: AC
  Administered 2014-11-10: 500 [IU] via INTRAVENOUS
  Filled 2014-11-10: qty 5

## 2014-11-10 MED ORDER — HYDROCOD POLST-CHLORPHEN POLST 10-8 MG/5ML PO LQCR: 5.0000 mL | Freq: Two times a day (BID) | ORAL | Status: DC | PRN

## 2014-11-10 NOTE — Patient Instructions (Signed)

## 2014-11-10 NOTE — Telephone Encounter (Signed)
Gave avs & cal for Jan.

## 2014-11-10 NOTE — Patient Instructions (Signed)
Continue Tarceva 150 mg by mouth daily Take Tussionex 1 teaspoon (5 ML) by mouth every 12 hours as needed for cough Follow-up in one month with a restaging CT scan of your chest to reevaluate your disease

## 2014-11-10 NOTE — Progress Notes (Addendum)
[facility] [phone]   Fax:([phone]  OFFICE PROGRESS NOTE  [name], MD [place] [address]  PRINCIPAL DIAGNOSIS: Local recurrence of non-small cell lung cancer, adenocarcinoma, initially diagnosed as stage IB (T2a N0 M0) in March of 2010.   PRIOR THERAPY:  1. Status post right lower lobectomy under the care of Dr. Cyndia Bent on 04/08/2009. 2. Status post palliative radiotherapy to the right lower lobe recurrent lung mass under the care of Dr. Lisbeth Renshaw, completed on June 08, 2011. 3. Systemic chemotherapy with carboplatin for AUC of 5 and Alimta 500 mg/M2 every 3 weeks. She is status post 3 cycles.  CURRENT THERAPY: Tarceva 150 mg by mouth daily, therapy beginning 07/24/2012. Status post approximately 24 months of therapy.   CHEMOTHERAPY INTENT: Palliative  CURRENT # OF CHEMOTHERAPY CYCLES: 25 CURRENT ANTIEMETICS: Compazine  CURRENT SMOKING STATUS: Nonsmoker  ORAL CHEMOTHERAPY AND CONSENT: Tarceva  CURRENT BISPHOSPHONATES USE: None  PAIN MANAGEMENT: None  NARCOTICS INDUCED CONSTIPATION: None  LIVING WILL AND CODE STATUS: Full code  INTERVAL HISTORY: [name] 53 y.o. female returns to the clinic today for follow up visit accompanied by her interpreter. She continues to tolerate her treatment with Tarceva fairly well with no significant adverse effects except for the mild skin rash and infrequent episodes of diarrhea. She denied having any significant chest pain, shortness of breath,  no hemoptysis. The patient denied having any significant weight loss or night sweats. She denied having any nausea or vomiting , fever or chills. She continues to have fairly frequent cough which she states is better manage with Tussionex cough syrup. She requests refill for this medication. She is no longer taking the [employer]  MEDICAL HISTORY: Past Medical History  Diagnosis Date  . Lung cancer     right lower lobe  adenocarcinoma  . History of radiation therapy 05/02/11 to 06/08/11    right lung    ALLERGIES:  is allergic to codeine and doxycycline.  MEDICATIONS:  Current Outpatient Prescriptions  Medication Sig Dispense Refill  . albuterol (PROVENTIL HFA;VENTOLIN HFA) 108 (90 BASE) MCG/ACT inhaler Inhale 2 puffs into the lungs every 6 (six) hours as needed for wheezing. 1 Inhaler 0  . chlorpheniramine-HYDROcodone (TUSSIONEX) 10-8 MG/5ML LQCR Take 5 mLs by mouth every 12 (twelve) hours as needed for cough. 115 mL 0  . clindamycin (CLEOCIN T) 1 % lotion APPLY TOPICALLY 2 (TWO) TIMES DAILY. 60 mL 0  . erlotinib (TARCEVA) 150 MG tablet Take 1 tablet (150 mg total) by mouth daily. 90 tablet 0  . fluticasone (CUTIVATE) 0.05 % cream Apply 1 application topically daily.     Marland Kitchen lidocaine-prilocaine (EMLA) cream Apply to Port-A-Cath prior to tx PRN 5 g prn 1 year  . naproxen sodium (ANAPROX) 220 MG tablet Take 440 mg by mouth 2 (two) times daily with a meal.    . chlorpheniramine-HYDROcodone (TUSSIONEX) 10-8 MG/5ML LQCR Take 5 mLs by mouth every 12 (twelve) hours as needed for cough. 473 mL 0   No current facility-administered medications for this visit.    SURGICAL HISTORY:  Past Surgical History  Procedure Laterality Date  . Lung lobectomy  04/18/2009    RLL  . Portacath placement  02/29/2012    Procedure: INSERTION PORT-A-CATH;  Surgeon: Nicanor Alcon, MD;  Location: [facility];  Service: Thoracic;  Laterality: Left;  PowerPort 8 F attachable in left internal jugular.    REVIEW OF SYSTEMS:  Constitutional: negative Eyes: negative  Ears, nose, mouth, throat, and face: negative Respiratory: positive for cough Cardiovascular: negative Gastrointestinal: positive for diarrhea and Occasional episodes Genitourinary:negative Integument/breast: positive for rash and Mild associated with Tarceva therapy Hematologic/lymphatic: negative Musculoskeletal:negative Neurological: negative Behavioral/Psych:  negative Endocrine: negative Allergic/Immunologic: negative   PHYSICAL EXAMINATION: General appearance: alert, cooperative and no distress Head: Normocephalic, without obvious abnormality, atraumatic Neck: no adenopathy, no JVD, supple, symmetrical, trachea midline and thyroid not enlarged, symmetric, no tenderness/mass/nodules Lymph nodes: Cervical, supraclavicular, and axillary nodes normal. Resp: wheezes bilaterally Back: symmetric, no curvature. ROM normal. No CVA tenderness. Cardio: regular rate and rhythm, S1, S2 normal, no murmur, click, rub or gallop GI: soft, non-tender; bowel sounds normal; no masses,  no organomegaly Extremities: extremities normal, atraumatic, no cyanosis or edema Neurologic: Alert and oriented X 3, normal strength and tone. Normal symmetric reflexes. Normal coordination and gait Skin: Grade 1 acneiform eruptions primarily along the eyebrow with  rare lesions on the remainder of the face.  ECOG PERFORMANCE STATUS: 1 - Symptomatic but completely ambulatory  Blood pressure 122/89, pulse 98, temperature 98.3 F (36.8 C), temperature source Oral, resp. rate 19, height 4\' 5"  (1.346 m), weight 127 lb 3.2 oz (57.698 kg).  LABORATORY DATA: Lab Results  Component Value Date   WBC 9.6 11/10/2014   HGB 13.5 11/10/2014   HCT 41.3 11/10/2014   MCV 86.0 11/10/2014   PLT 284 11/10/2014      Chemistry      Component Value Date/Time   NA 140 11/10/2014 0957   NA 140 09/09/2014 0410   NA 141 01/06/2012 0804   K 3.8 11/10/2014 0957   K 3.9 09/09/2014 0410   K 4.6 01/06/2012 0804   CL 102 09/09/2014 0410   CL 102 04/30/2013 1002   CL 98 01/06/2012 0804   CO2 25 11/10/2014 0957   CO2 28 09/09/2014 0410   CO2 28 01/06/2012 0804   BUN 11.6 11/10/2014 0957   BUN 8 09/09/2014 0410   BUN 10 01/06/2012 0804   CREATININE 0.7 11/10/2014 0957   CREATININE 0.46* 09/09/2014 0410   CREATININE 0.6 01/06/2012 0804      Component Value Date/Time   CALCIUM 9.0 11/10/2014  0957   CALCIUM 8.7 09/09/2014 0410   CALCIUM 8.9 01/06/2012 0804   ALKPHOS 86 11/10/2014 0957   ALKPHOS 93 07/12/2012 1121   ALKPHOS 64 01/06/2012 0804   AST 18 11/10/2014 0957   AST 44* 07/12/2012 1121   AST 27 01/06/2012 0804   ALT 15 11/10/2014 0957   ALT 49* 07/12/2012 1121   ALT 24 01/06/2012 0804   BILITOT 0.40 11/10/2014 0957   BILITOT 0.3 07/12/2012 1121   BILITOT 0.60 01/06/2012 0804       RADIOGRAPHIC STUDIES: No results found.  ASSESSMENT AND PLAN: This is a very pleasant 53 years old Asian female with recurrent non-small cell lung cancer, adenocarcinoma. She is currently on treatment with oral Tarceva 150 mg by mouth daily status post 24 months of treatment. She is tolerating her treatment with Tarceva fairly well. The patient was discussed with and also seen by Dr. Julien Nordmann. She will continue on Tarceva at 150 mg by mouth daily. She will follow-up in one month with a restaging CT scan of the chest with contrast to reevaluate her disease. For the cough she was given a refill prescription for Tussionex 5 ML's by mouth every 12 hours as needed for cough, total of 473 ML's with no refill. For diarrhea, she will continue on Imodium on as-needed basis.  She was advised to call immediately if she has any concerning symptoms in the interval. The patient voices understanding of current disease status and treatment options and is in agreement with the current care plan.  All questions were answered. The patient knows to call the clinic with any problems, questions or concerns. We can certainly see the patient much sooner if necessary.  Carlton Adam, PA-C 11/10/2014  ADDENDUM: Hematology/Oncology Attending: I had a face to face encounter with the patient today. I recommended her care plan. This is a very pleasant 53 years old Asian female with recurrent non-small cell lung cancer, adenocarcinoma currently undergoing treatment with oral Tarceva status post 24 cycles. The  patient is tolerating her treatment well except for mild skin rash and occasional episodes of diarrhea. She also has dry cough and requested refill of Tussionex. I recommended for the patient to continue his current treatment with Tarceva at the same doses. She will come back for follow-up visit in one month after repeating CT scan of the chest for restaging of her disease. She was advised to call immediately if she has any concerning symptoms in the interval.  Disclaimer: This note was dictated with voice recognition software. Similar sounding words can inadvertently be transcribed and may not be corrected upon review. [name], MD 11/10/2014

## 2014-11-29 ENCOUNTER — Other Ambulatory Visit: Payer: Self-pay | Admitting: Internal Medicine

## 2014-12-05 ENCOUNTER — Ambulatory Visit (HOSPITAL_COMMUNITY)
Admission: RE | Admit: 2014-12-05 | Discharge: 2014-12-05 | Disposition: A | Discharge status: Home / Self Care | Payer: 59 | Source: Ambulatory Visit | Attending: Physician Assistant | Admitting: Physician Assistant

## 2014-12-05 ENCOUNTER — Encounter (HOSPITAL_COMMUNITY): Payer: Self-pay

## 2014-12-05 DIAGNOSIS — C3431 Malignant neoplasm of lower lobe, right bronchus or lung: Secondary | ICD-10-CM

## 2014-12-05 DIAGNOSIS — J90 Pleural effusion, not elsewhere classified: Secondary | ICD-10-CM | POA: Diagnosis not present

## 2014-12-05 DIAGNOSIS — Z923 Personal history of irradiation: Secondary | ICD-10-CM | POA: Diagnosis not present

## 2014-12-05 DIAGNOSIS — K760 Fatty (change of) liver, not elsewhere classified: Secondary | ICD-10-CM | POA: Insufficient documentation

## 2014-12-05 MED ORDER — IOHEXOL 300 MG/ML  SOLN
80.0000 mL | Freq: Once | INTRAMUSCULAR | Status: AC | PRN
  Administered 2014-12-05: 80 mL via INTRAVENOUS

## 2014-12-08 ENCOUNTER — Ambulatory Visit (HOSPITAL_BASED_OUTPATIENT_CLINIC_OR_DEPARTMENT_OTHER): Payer: 59 | Admitting: Internal Medicine

## 2014-12-08 ENCOUNTER — Encounter: Payer: Self-pay | Admitting: Internal Medicine

## 2014-12-08 ENCOUNTER — Other Ambulatory Visit (HOSPITAL_BASED_OUTPATIENT_CLINIC_OR_DEPARTMENT_OTHER): Payer: 59

## 2014-12-08 ENCOUNTER — Telehealth: Payer: Self-pay | Admitting: Internal Medicine

## 2014-12-08 ENCOUNTER — Ambulatory Visit: Payer: 59

## 2014-12-08 VITALS — BP 129/83 | HR 96 | Temp 98.1°F | Resp 18 | Ht 60.0 in | Wt 128.0 lb

## 2014-12-08 DIAGNOSIS — R062 Wheezing: Secondary | ICD-10-CM

## 2014-12-08 DIAGNOSIS — C3431 Malignant neoplasm of lower lobe, right bronchus or lung: Secondary | ICD-10-CM

## 2014-12-08 DIAGNOSIS — R197 Diarrhea, unspecified: Secondary | ICD-10-CM

## 2014-12-08 DIAGNOSIS — Z95828 Presence of other vascular implants and grafts: Secondary | ICD-10-CM

## 2014-12-08 LAB — CBC WITH DIFFERENTIAL/PLATELET
BASO%: 0.5 % (ref 0.0–2.0)
Basophils Absolute: 0 10*3/uL (ref 0.0–0.1)
EOS%: 1.9 % (ref 0.0–7.0)
Eosinophils Absolute: 0.1 10*3/uL (ref 0.0–0.5)
HCT: 39.8 % (ref 34.8–46.6)
HGB: 12.7 g/dL (ref 11.6–15.9)
LYMPH%: 24 % (ref 14.0–49.7)
MCH: 27.5 pg (ref 25.1–34.0)
MCHC: 32 g/dL (ref 31.5–36.0)
MCV: 86 fL (ref 79.5–101.0)
MONO#: 0.4 10*3/uL (ref 0.1–0.9)
MONO%: 6.1 % (ref 0.0–14.0)
NEUT#: 4.8 10*3/uL (ref 1.5–6.5)
NEUT%: 67.5 % (ref 38.4–76.8)
Platelets: 305 10*3/uL (ref 145–400)
RBC: 4.63 10*6/uL (ref 3.70–5.45)
RDW: 14.4 % (ref 11.2–14.5)
WBC: 7 10*3/uL (ref 3.9–10.3)
lymph#: 1.7 10*3/uL (ref 0.9–3.3)

## 2014-12-08 LAB — COMPREHENSIVE METABOLIC PANEL (CC13)
ALT: 19 U/L (ref 0–55)
AST: 22 U/L (ref 5–34)
Albumin: 3.8 g/dL (ref 3.5–5.0)
Alkaline Phosphatase: 87 U/L (ref 40–150)
Anion Gap: 7 mEq/L (ref 3–11)
BUN: 11.2 mg/dL (ref 7.0–26.0)
CO2: 29 mEq/L (ref 22–29)
Calcium: 8.9 mg/dL (ref 8.4–10.4)
Chloride: 104 mEq/L (ref 98–109)
Creatinine: 0.6 mg/dL (ref 0.6–1.1)
EGFR: >90 mL/min/{1.73_m2} (ref >90)
Glucose: 102 mg/dl (ref 70–140)
Potassium: 3.8 mEq/L (ref 3.5–5.1)
Sodium: 140 mEq/L (ref 136–145)
Total Bilirubin: 0.62 mg/dL (ref 0.20–1.20)
Total Protein: 7.3 g/dL (ref 6.4–8.3)

## 2014-12-08 MED ORDER — SODIUM CHLORIDE 0.9 % IJ SOLN
10.0000 mL | INTRAMUSCULAR | Status: DC | PRN
Start: 2014-12-08 — End: 2014-12-08
  Administered 2014-12-08: 10 mL via INTRAVENOUS
  Filled 2014-12-08: qty 10

## 2014-12-08 MED ORDER — HEPARIN SOD (PORK) LOCK FLUSH 100 UNIT/ML IV SOLN
500.0000 [IU] | Freq: Once | INTRAVENOUS | Status: AC
  Administered 2014-12-08: 500 [IU] via INTRAVENOUS
  Filled 2014-12-08: qty 5

## 2014-12-08 NOTE — Progress Notes (Signed)
[facility] [phone]   Fax:([phone]  OFFICE PROGRESS NOTE  [name]., MD [place] [address]  PRINCIPAL DIAGNOSIS: Local recurrence of non-small cell lung cancer, adenocarcinoma, initially diagnosed as stage IB (T2a N0 M0) in March of 2010.   PRIOR THERAPY:  1. Status post right lower lobectomy under the care of Dr. Cyndia Bent on 04/08/2009. 2. Status post palliative radiotherapy to the right lower lobe recurrent lung mass under the care of Dr. Lisbeth Renshaw, completed on June 08, 2011. 3. Systemic chemotherapy with carboplatin for AUC of 5 and Alimta 500 mg/M2 every 3 weeks. She is status post 3 cycles.  CURRENT THERAPY: Tarceva 150 mg by mouth daily, therapy beginning 07/24/2012. Status post approximately 25 months of therapy.   CHEMOTHERAPY INTENT: Palliative  CURRENT # OF CHEMOTHERAPY CYCLES: 26 CURRENT ANTIEMETICS: Compazine  CURRENT SMOKING STATUS: Nonsmoker  ORAL CHEMOTHERAPY AND CONSENT: Tarceva  CURRENT BISPHOSPHONATES USE: None  PAIN MANAGEMENT: None  NARCOTICS INDUCED CONSTIPATION: None  LIVING WILL AND CODE STATUS: Full code  INTERVAL HISTORY: [name] 54 y.o. female returns to the clinic today for follow up visit accompanied by her son. She is tolerating her treatment with Tarceva fairly well with no significant adverse effects except for the mild skin rash and infrequent episodes of diarrhea. She also continues to have wheezing in her chest but she does not use her inhaler at regular basis. She denied having any significant chest pain, shortness of breath,  no hemoptysis. The patient denied having any significant weight loss or night sweats. She denied having any nausea or vomiting , fever or chills. She had repeat CT scan of the chest performed recently and she is here for evaluation and discussion of her scan results.  MEDICAL HISTORY: Past Medical History  Diagnosis Date  . History of radiation therapy  05/02/11 to 06/08/11    right lung  . Lung cancer     right lower lobe adenocarcinoma    ALLERGIES:  is allergic to codeine and doxycycline.  MEDICATIONS:  Current Outpatient Prescriptions  Medication Sig Dispense Refill  . chlorpheniramine-HYDROcodone (TUSSIONEX) 10-8 MG/5ML LQCR Take 5 mLs by mouth every 12 (twelve) hours as needed for cough. 115 mL 0  . clindamycin (CLEOCIN T) 1 % lotion APPLY TOPICALLY 2 (TWO) TIMES DAILY. 60 mL 0  . erlotinib (TARCEVA) 150 MG tablet Take 1 tablet (150 mg total) by mouth daily. 90 tablet 0  . fluticasone (CUTIVATE) 0.05 % cream Apply 1 application topically daily.     Marland Kitchen lidocaine-prilocaine (EMLA) cream Apply to Port-A-Cath prior to tx PRN 5 g prn 1 year  . naproxen sodium (ANAPROX) 220 MG tablet Take 440 mg by mouth 2 (two) times daily with a meal.    . albuterol (PROVENTIL HFA;VENTOLIN HFA) 108 (90 BASE) MCG/ACT inhaler Inhale 2 puffs into the lungs every 6 (six) hours as needed for wheezing. (Patient not taking: Reported on 12/08/2014) 1 Inhaler 0   No current facility-administered medications for this visit.    SURGICAL HISTORY:  Past Surgical History  Procedure Laterality Date  . Lung lobectomy  04/18/2009    RLL  . Portacath placement  02/29/2012    Procedure: INSERTION PORT-A-CATH;  Surgeon: Nicanor Alcon, MD;  Location: [facility];  Service: Thoracic;  Laterality: Left;  PowerPort 8 F attachable in left internal jugular.    REVIEW OF SYSTEMS:  Constitutional: negative Eyes: negative Ears, nose, mouth, throat, and face: negative Respiratory:  negative Cardiovascular: negative Gastrointestinal: negative Genitourinary:negative Integument/breast: negative Hematologic/lymphatic: negative Musculoskeletal:negative Neurological: negative Behavioral/Psych: negative Endocrine: negative Allergic/Immunologic: negative   PHYSICAL EXAMINATION: General appearance: alert, cooperative and no distress Head: Normocephalic, without obvious abnormality,  atraumatic Neck: no adenopathy, no JVD, supple, symmetrical, trachea midline and thyroid not enlarged, symmetric, no tenderness/mass/nodules Lymph nodes: Cervical, supraclavicular, and axillary nodes normal. Resp: wheezes bilaterally Back: symmetric, no curvature. ROM normal. No CVA tenderness. Cardio: regular rate and rhythm, S1, S2 normal, no murmur, click, rub or gallop GI: soft, non-tender; bowel sounds normal; no masses,  no organomegaly Extremities: extremities normal, atraumatic, no cyanosis or edema Neurologic: Alert and oriented X 3, normal strength and tone. Normal symmetric reflexes. Normal coordination and gait  ECOG PERFORMANCE STATUS: 1 - Symptomatic but completely ambulatory  Blood pressure 129/83, pulse 96, temperature 98.1 F (36.7 C), temperature source Oral, resp. rate 18, height 5' (1.524 m), weight 128 lb (58.06 kg), SpO2 100 %.  LABORATORY DATA: Lab Results  Component Value Date   WBC 7.0 12/08/2014   HGB 12.7 12/08/2014   HCT 39.8 12/08/2014   MCV 86.0 12/08/2014   PLT 305 12/08/2014      Chemistry      Component Value Date/Time   NA 140 12/08/2014 0948   NA 140 09/09/2014 0410   NA 141 01/06/2012 0804   K 3.8 12/08/2014 0948   K 3.9 09/09/2014 0410   K 4.6 01/06/2012 0804   CL 102 09/09/2014 0410   CL 102 04/30/2013 1002   CL 98 01/06/2012 0804   CO2 29 12/08/2014 0948   CO2 28 09/09/2014 0410   CO2 28 01/06/2012 0804   BUN 11.2 12/08/2014 0948   BUN 8 09/09/2014 0410   BUN 10 01/06/2012 0804   CREATININE 0.6 12/08/2014 0948   CREATININE 0.46* 09/09/2014 0410   CREATININE 0.6 01/06/2012 0804      Component Value Date/Time   CALCIUM 8.9 12/08/2014 0948   CALCIUM 8.7 09/09/2014 0410   CALCIUM 8.9 01/06/2012 0804   ALKPHOS 87 12/08/2014 0948   ALKPHOS 93 07/12/2012 1121   ALKPHOS 64 01/06/2012 0804   AST 22 12/08/2014 0948   AST 44* 07/12/2012 1121   AST 27 01/06/2012 0804   ALT 19 12/08/2014 0948   ALT 49* 07/12/2012 1121   ALT 24  01/06/2012 0804   BILITOT 0.62 12/08/2014 0948   BILITOT 0.3 07/12/2012 1121   BILITOT 0.60 01/06/2012 0804       RADIOGRAPHIC STUDIES: Ct Chest W Contrast  12/05/2014   CLINICAL DATA:  Right lower lobe primary bronchogenic carcinoma. Non-small-cell type. History of radiation therapy 05/02/2011-10/25/2011. Oral chemotherapy in progress. Partial right lower lobectomy. Cough.  EXAM: CT CHEST WITH CONTRAST  TECHNIQUE: Multidetector CT imaging of the chest was performed during intravenous contrast administration.  CONTRAST:  28mL OMNIPAQUE IOHEXOL 300 MG/ML  SOLN  COMPARISON:  09/07/2014 Chest CT.  FINDINGS: Mediastinum/Nodes: No supraclavicular adenopathy. A left-sided Port-A-Cath which terminates at the low SVC. Tortuous thoracic aorta. Mild cardiomegaly, without pericardial effusion. No central pulmonary embolism, on this non-dedicated study. No mediastinal or hilar adenopathy.  Lungs/Pleura: Surgical changes in the right lower lobe. Mild volume loss in the right hemi thorax. Vague right upper lobe pulmonary nodule measures 4-5 mm on image 17. Similar to on image 16 of the prior. Clear left lung.  Similar appearance of probable scarring with linear opacity about the right apex, lateral to surgical sutures. Example image 15 series 5. Right perihilar radiation fibrosis is similar and extends  in a similar configuration to the medial right lung base.  Right middle lobe pneumonia has resolved since the prior.  Small right-sided pleural effusion with mild anterior loculation. Similar.  Upper abdomen: Mild hepatic steatosis. Normal imaged portions of the spleen, stomach, pancreas, adrenal glands, kidneys.  Musculoskeletal: Remote left clavicular trauma.  IMPRESSION: 1. Similar appearance of surgical and radiation changes in the right lung. No locally recurrent or metastatic disease. 2. Minimal right upper lobe nodularity and probable posterior right upper lobe scarring. These are not significantly changed, but  warrant followup attention. 3. Similar small right pleural effusion with minimal loculation. 4. Mild hepatic steatosis.   Electronically Signed   By: Abigail Miyamoto M.D.   On: 12/05/2014 13:16    ASSESSMENT AND PLAN: This is a very pleasant 54 years old Asian female with recurrent non-small cell lung cancer, adenocarcinoma. She is currently on treatment with oral Tarceva 150 mg by mouth daily status post 25 months of treatment. She is tolerating her treatment with Tarceva fairly well.  The recent CT scan of the chest showed no evidence for disease progression. I discussed the scan results with the patient and her son. I recommended for the patient to continue with the current treatment with Tarceva 150 mg by mouth daily. For diarrhea, she will continue on Imodium on as-needed basis. For the chest wheezing, I advised the patient to continue with her inhaler every 6 hours as needed. She will come back for follow-up visit in one month's for reevaluation with repeat blood work. She was advised to call immediately if she has any concerning symptoms in the interval. The patient voices understanding of current disease status and treatment options and is in agreement with the current care plan.  All questions were answered. The patient knows to call the clinic with any problems, questions or concerns. We can certainly see the patient much sooner if necessary.  Disclaimer: This note was dictated with voice recognition software. Similar sounding words can inadvertently be transcribed and may not be corrected upon review.

## 2014-12-08 NOTE — Telephone Encounter (Signed)
Gave avs & cal for Feb. °

## 2015-01-04 ENCOUNTER — Other Ambulatory Visit: Payer: Self-pay | Admitting: Internal Medicine

## 2015-01-05 ENCOUNTER — Telehealth: Payer: Self-pay | Admitting: Physician Assistant

## 2015-01-05 ENCOUNTER — Other Ambulatory Visit: Payer: Self-pay

## 2015-01-05 ENCOUNTER — Ambulatory Visit: Payer: Self-pay | Admitting: Physician Assistant

## 2015-01-05 ENCOUNTER — Encounter: Payer: Self-pay | Admitting: Physician Assistant

## 2015-01-05 ENCOUNTER — Ambulatory Visit (HOSPITAL_BASED_OUTPATIENT_CLINIC_OR_DEPARTMENT_OTHER): Payer: 59 | Admitting: Physician Assistant

## 2015-01-05 ENCOUNTER — Other Ambulatory Visit (HOSPITAL_BASED_OUTPATIENT_CLINIC_OR_DEPARTMENT_OTHER): Payer: 59

## 2015-01-05 VITALS — BP 123/85 | HR 89 | Temp 98.7°F | Resp 18 | Ht 60.0 in | Wt 131.0 lb

## 2015-01-05 DIAGNOSIS — R062 Wheezing: Secondary | ICD-10-CM

## 2015-01-05 DIAGNOSIS — C3431 Malignant neoplasm of lower lobe, right bronchus or lung: Secondary | ICD-10-CM

## 2015-01-05 DIAGNOSIS — C3490 Malignant neoplasm of unspecified part of unspecified bronchus or lung: Secondary | ICD-10-CM

## 2015-01-05 LAB — COMPREHENSIVE METABOLIC PANEL (CC13)
ALT: 17 U/L (ref 0–55)
AST: 20 U/L (ref 5–34)
Albumin: 3.5 g/dL (ref 3.5–5.0)
Alkaline Phosphatase: 80 U/L (ref 40–150)
Anion Gap: 8 mEq/L (ref 3–11)
BUN: 14.5 mg/dL (ref 7.0–26.0)
CO2: 28 mEq/L (ref 22–29)
Calcium: 8.8 mg/dL (ref 8.4–10.4)
Chloride: 106 mEq/L (ref 98–109)
Creatinine: 0.7 mg/dL (ref 0.6–1.1)
EGFR: >90 mL/min/{1.73_m2} (ref >90)
Glucose: 90 mg/dl (ref 70–140)
Potassium: 4.2 mEq/L (ref 3.5–5.1)
Sodium: 142 mEq/L (ref 136–145)
Total Bilirubin: 0.3 mg/dL (ref 0.20–1.20)
Total Protein: 6.7 g/dL (ref 6.4–8.3)

## 2015-01-05 LAB — CBC WITH DIFFERENTIAL/PLATELET
BASO%: 0.4 % (ref 0.0–2.0)
Basophils Absolute: 0 10*3/uL (ref 0.0–0.1)
EOS%: 1.6 % (ref 0.0–7.0)
Eosinophils Absolute: 0.1 10*3/uL (ref 0.0–0.5)
HCT: 38.6 % (ref 34.8–46.6)
HGB: 12.5 g/dL (ref 11.6–15.9)
LYMPH%: 29 % (ref 14.0–49.7)
MCH: 27.9 pg (ref 25.1–34.0)
MCHC: 32.4 g/dL (ref 31.5–36.0)
MCV: 86.1 fL (ref 79.5–101.0)
MONO#: 0.6 10*3/uL (ref 0.1–0.9)
MONO%: 7.9 % (ref 0.0–14.0)
NEUT#: 4.6 10*3/uL (ref 1.5–6.5)
NEUT%: 61.1 % (ref 38.4–76.8)
Platelets: 309 10*3/uL (ref 145–400)
RBC: 4.48 10*6/uL (ref 3.70–5.45)
RDW: 14.1 % (ref 11.2–14.5)
WBC: 7.6 10*3/uL (ref 3.9–10.3)
lymph#: 2.2 10*3/uL (ref 0.9–3.3)

## 2015-01-05 NOTE — Progress Notes (Addendum)
[facility] Telephone:([phone]   Fax:([phone]  OFFICE PROGRESS NOTE  [name], MD [place] [address]  PRINCIPAL DIAGNOSIS: Local recurrence of non-small cell lung cancer, adenocarcinoma, initially diagnosed as stage IB (T2a N0 M0) in March of 2010.   PRIOR THERAPY:  1. Status post right lower lobectomy under the care of Dr. Cyndia Bent on 04/08/2009. 2. Status post palliative radiotherapy to the right lower lobe recurrent lung mass under the care of Dr. Lisbeth Renshaw, completed on June 08, 2011. 3. Systemic chemotherapy with carboplatin for AUC of 5 and Alimta 500 mg/M2 every 3 weeks. She is status post 3 cycles.  CURRENT THERAPY: Tarceva 150 mg by mouth daily, therapy beginning 07/24/2012. Status post approximately 26 months of therapy.   CHEMOTHERAPY INTENT: Palliative  CURRENT # OF CHEMOTHERAPY CYCLES: 27 CURRENT ANTIEMETICS: Compazine  CURRENT SMOKING STATUS: Nonsmoker  ORAL CHEMOTHERAPY AND CONSENT: Tarceva  CURRENT BISPHOSPHONATES USE: None  PAIN MANAGEMENT: None  NARCOTICS INDUCED CONSTIPATION: None  LIVING WILL AND CODE STATUS: Full code  INTERVAL HISTORY: [name] 54 y.o. female returns to the clinic today for follow up visit accompanied by her son. She is tolerating her treatment with Tarceva fairly well with no significant adverse effects except for the mild skin rash and infrequent episodes of diarrhea. She also continues to have occasional cough and wheezing in her chest, but she does not use her inhaler at regular basis. She denied having any significant chest pain, shortness of breath,  no hemoptysis. The patient denied having any significant weight loss or night sweats. She denied having any nausea or vomiting , fever or chills. She is planning to go to [place] in  April for 3 weeks. She also reprts that her husband will be out of a job at the end of February and they will be without health insurance.  MEDICAL  HISTORY: Past Medical History  Diagnosis Date  . History of radiation therapy 05/02/11 to 06/08/11    right lung  . Lung cancer     right lower lobe adenocarcinoma    ALLERGIES:  is allergic to codeine and doxycycline.  MEDICATIONS:  Current Outpatient Prescriptions  Medication Sig Dispense Refill  . chlorpheniramine-HYDROcodone (TUSSIONEX) 10-8 MG/5ML LQCR Take 5 mLs by mouth every 12 (twelve) hours as needed for cough. 115 mL 0  . clindamycin (CLEOCIN T) 1 % lotion APPLY TOPICALLY 2 (TWO) TIMES DAILY. 60 mL 0  . erlotinib (TARCEVA) 150 MG tablet Take 1 tablet (150 mg total) by mouth daily. 90 tablet 0  . fluticasone (CUTIVATE) 0.05 % cream Apply 1 application topically daily.     Marland Kitchen lidocaine-prilocaine (EMLA) cream Apply to Port-A-Cath prior to tx PRN 5 g prn 1 year  . naproxen sodium (ANAPROX) 220 MG tablet Take 440 mg by mouth 2 (two) times daily with a meal.    . albuterol (PROVENTIL HFA;VENTOLIN HFA) 108 (90 BASE) MCG/ACT inhaler Inhale 2 puffs into the lungs every 6 (six) hours as needed for wheezing. (Patient not taking: Reported on 01/05/2015) 1 Inhaler 0   No current facility-administered medications for this visit.    SURGICAL HISTORY:  Past Surgical History  Procedure Laterality Date  . Lung lobectomy  04/18/2009    RLL  . Portacath placement  02/29/2012    Procedure: INSERTION PORT-A-CATH;  Surgeon: Nicanor Alcon, MD;  Location: [facility];  Service: Thoracic;  Laterality: Left;  PowerPort 8 F attachable in left internal jugular.  REVIEW OF SYSTEMS:  Constitutional: negative Eyes: negative Ears, nose, mouth, throat, and face: negative Respiratory: positive for cough, wheezing and occasional epsiodes Cardiovascular: negative Gastrointestinal: negative Genitourinary:negative Integument/breast: negative Hematologic/lymphatic: negative Musculoskeletal:negative Neurological: negative Behavioral/Psych: negative Endocrine: negative Allergic/Immunologic: negative    PHYSICAL EXAMINATION: General appearance: alert, cooperative and no distress Head: Normocephalic, without obvious abnormality, atraumatic Neck: no adenopathy, no JVD, supple, symmetrical, trachea midline and thyroid not enlarged, symmetric, no tenderness/mass/nodules Lymph nodes: Cervical, supraclavicular, and axillary nodes normal. Resp: wheezes bilaterally Back: symmetric, no curvature. ROM normal. No CVA tenderness. Cardio: regular rate and rhythm, S1, S2 normal, no murmur, click, rub or gallop GI: soft, non-tender; bowel sounds normal; no masses,  no organomegaly Extremities: extremities normal, atraumatic, no cyanosis or edema Neurologic: Alert and oriented X 3, normal strength and tone. Normal symmetric reflexes. Normal coordination and gait  ECOG PERFORMANCE STATUS: 1 - Symptomatic but completely ambulatory  Blood pressure 123/85, pulse 89, temperature 98.7 F (37.1 C), temperature source Oral, resp. rate 18, height 5' (1.524 m), weight 131 lb (59.421 kg), SpO2 97 %.  LABORATORY DATA: Lab Results  Component Value Date   WBC 7.6 01/05/2015   HGB 12.5 01/05/2015   HCT 38.6 01/05/2015   MCV 86.1 01/05/2015   PLT 309 01/05/2015      Chemistry      Component Value Date/Time   NA 142 01/05/2015 0847   NA 140 09/09/2014 0410   NA 141 01/06/2012 0804   K 4.2 01/05/2015 0847   K 3.9 09/09/2014 0410   K 4.6 01/06/2012 0804   CL 102 09/09/2014 0410   CL 102 04/30/2013 1002   CL 98 01/06/2012 0804   CO2 28 01/05/2015 0847   CO2 28 09/09/2014 0410   CO2 28 01/06/2012 0804   BUN 14.5 01/05/2015 0847   BUN 8 09/09/2014 0410   BUN 10 01/06/2012 0804   CREATININE 0.7 01/05/2015 0847   CREATININE 0.46* 09/09/2014 0410   CREATININE 0.6 01/06/2012 0804      Component Value Date/Time   CALCIUM 8.8 01/05/2015 0847   CALCIUM 8.7 09/09/2014 0410   CALCIUM 8.9 01/06/2012 0804   ALKPHOS 80 01/05/2015 0847   ALKPHOS 93 07/12/2012 1121   ALKPHOS 64 01/06/2012 0804   AST 20  01/05/2015 0847   AST 44* 07/12/2012 1121   AST 27 01/06/2012 0804   ALT 17 01/05/2015 0847   ALT 49* 07/12/2012 1121   ALT 24 01/06/2012 0804   BILITOT 0.30 01/05/2015 0847   BILITOT 0.3 07/12/2012 1121   BILITOT 0.60 01/06/2012 0804       RADIOGRAPHIC STUDIES: No results found.  ASSESSMENT AND PLAN: This is a very pleasant 54 years old Asian female with recurrent non-small cell lung cancer, adenocarcinoma. She is currently on treatment with oral Tarceva 150 mg by mouth daily status post 26 months of treatment. She is tolerating her treatment with Tarceva fairly well.  The recent CT scan of the chest showed no evidence for disease progression. The patient was discussed with and also seen by Dr. Julien Nordmann. She will continue with the current treatment with Tarceva 150 mg by mouth daily. For diarrhea, she will continue on Imodium on as-needed basis. For the chest wheezing, she is advised to continue with her inhaler every 6 hours as needed. She will come back for follow-up visit in one month's for reevaluation with repeat blood work. She was advised to speak with our Managed Care department at the end of this month regarding her financial  status and lack of insurance coverage to see if there are programs available to cover her Tarceva. She will check with the Health Department to see if there are any immunizations she will need prior to going to [place]. She was advised to call immediately if she has any concerning symptoms in the interval. The patient voices understanding of current disease status and treatment options and is in agreement with the current care plan.  All questions were answered. The patient knows to call the clinic with any problems, questions or concerns. We can certainly see the patient much sooner if necessary.  Carlton Adam, PA-C 01/05/2015  ADDENDUM: Hematology/Oncology Attending: I had a face to face encounter with the patient today. I recommended her care  plan. This is a very pleasant 54 years old Asian female with recurrent non-small cell lung cancer, adenocarcinoma with currently on treatment with Tarceva status post 26 cycles. She is treating her treatment fairly well with no significant adverse effects. She denied having any significant skin rash or diarrhea. I recommended for her to continue her current treatment as scheduled. The patient has plans to travel to [place] in April 2016 for 3 weeks.  The patient also mentions that she may have issues with insurance coverage for her Tarceva after her husband quit his job which is expected and few weeks and they would not have any insurance coverage. The patient will be seen by the managed care department to help with her application for medication coverage with the pharmaceutical company. She would come back for follow-up visit in one month for evaluation. She was advised to call immediately if she has any concerning symptoms in the interval.  Disclaimer: This note was dictated with voice recognition software. Similar sounding words can inadvertently be transcribed and may not be corrected upon review. [name], MD 01/05/2015

## 2015-01-05 NOTE — Telephone Encounter (Signed)
Gave avs & calendar for March. °

## 2015-01-05 NOTE — Patient Instructions (Signed)
Continue Tarceva 150 mg by mouth daily Follow up in one month

## 2015-01-19 ENCOUNTER — Encounter: Payer: Self-pay | Admitting: *Deleted

## 2015-01-20 ENCOUNTER — Telehealth: Payer: Self-pay | Admitting: *Deleted

## 2015-01-20 NOTE — Telephone Encounter (Signed)
Call received from patient's son Ra Sin asking status of letter for Hepatitis vaccine to go out of the country March 01, 2015.  Letter is rady and reads sent.  This nurse printed per request and they will pick up a copy today.  Letter at front registation desk.

## 2015-01-26 ENCOUNTER — Telehealth: Payer: Self-pay | Admitting: Medical Oncology

## 2015-01-26 NOTE — Telephone Encounter (Signed)
Returned son's call. Pts insurance coverage had ended ( husbands job ended) . She needs to apply to medicaid. Note to  managed care.

## 2015-02-02 ENCOUNTER — Encounter: Payer: Self-pay | Admitting: Internal Medicine

## 2015-02-02 ENCOUNTER — Ambulatory Visit (HOSPITAL_BASED_OUTPATIENT_CLINIC_OR_DEPARTMENT_OTHER): Payer: 59 | Admitting: Internal Medicine

## 2015-02-02 ENCOUNTER — Other Ambulatory Visit (HOSPITAL_BASED_OUTPATIENT_CLINIC_OR_DEPARTMENT_OTHER): Payer: 59

## 2015-02-02 ENCOUNTER — Telehealth: Payer: Self-pay | Admitting: Internal Medicine

## 2015-02-02 VITALS — BP 119/85 | HR 91 | Temp 98.6°F | Resp 18 | Ht 60.0 in | Wt 128.4 lb

## 2015-02-02 DIAGNOSIS — R197 Diarrhea, unspecified: Secondary | ICD-10-CM

## 2015-02-02 DIAGNOSIS — C3491 Malignant neoplasm of unspecified part of right bronchus or lung: Secondary | ICD-10-CM

## 2015-02-02 DIAGNOSIS — R062 Wheezing: Secondary | ICD-10-CM

## 2015-02-02 DIAGNOSIS — C3431 Malignant neoplasm of lower lobe, right bronchus or lung: Secondary | ICD-10-CM

## 2015-02-02 LAB — CBC WITH DIFFERENTIAL/PLATELET
BASO%: 0.1 % (ref 0.0–2.0)
Basophils Absolute: 0 10*3/uL (ref 0.0–0.1)
EOS%: 1.3 % (ref 0.0–7.0)
Eosinophils Absolute: 0.1 10*3/uL (ref 0.0–0.5)
HCT: 40.1 % (ref 34.8–46.6)
HGB: 13 g/dL (ref 11.6–15.9)
LYMPH%: 26.7 % (ref 14.0–49.7)
MCH: 28.7 pg (ref 25.1–34.0)
MCHC: 32.4 g/dL (ref 31.5–36.0)
MCV: 88.5 fL (ref 79.5–101.0)
MONO#: 0.6 10*3/uL (ref 0.1–0.9)
MONO%: 8.2 % (ref 0.0–14.0)
NEUT#: 5 10*3/uL (ref 1.5–6.5)
NEUT%: 63.7 % (ref 38.4–76.8)
Platelets: 302 10*3/uL (ref 145–400)
RBC: 4.53 10*6/uL (ref 3.70–5.45)
RDW: 14.2 % (ref 11.2–14.5)
WBC: 7.8 10*3/uL (ref 3.9–10.3)
lymph#: 2.1 10*3/uL (ref 0.9–3.3)

## 2015-02-02 LAB — COMPREHENSIVE METABOLIC PANEL (CC13)
ALT: 17 U/L (ref 0–55)
AST: 20 U/L (ref 5–34)
Albumin: 3.6 g/dL (ref 3.5–5.0)
Alkaline Phosphatase: 71 U/L (ref 40–150)
Anion Gap: 10 mEq/L (ref 3–11)
BUN: 10.4 mg/dL (ref 7.0–26.0)
CO2: 25 mEq/L (ref 22–29)
Calcium: 8.8 mg/dL (ref 8.4–10.4)
Chloride: 106 mEq/L (ref 98–109)
Creatinine: 0.6 mg/dL (ref 0.6–1.1)
EGFR: >90 mL/min/{1.73_m2} (ref >90)
Glucose: 93 mg/dl (ref 70–140)
Potassium: 4 mEq/L (ref 3.5–5.1)
Sodium: 142 mEq/L (ref 136–145)
Total Bilirubin: 0.86 mg/dL (ref 0.20–1.20)
Total Protein: 6.9 g/dL (ref 6.4–8.3)

## 2015-02-02 MED ORDER — CLINDAMYCIN PHOSPHATE 1 % EX LOTN: TOPICAL_LOTION | Freq: Two times a day (BID) | CUTANEOUS | Status: DC

## 2015-02-02 MED ORDER — ALBUTEROL SULFATE HFA 108 (90 BASE) MCG/ACT IN AERS: 2.0000 | INHALATION_SPRAY | Freq: Four times a day (QID) | RESPIRATORY_TRACT | Status: DC | PRN

## 2015-02-02 MED ORDER — HYDROCODONE-HOMATROPINE 5-1.5 MG/5ML PO SYRP: 5.0000 mL | ORAL_SOLUTION | Freq: Four times a day (QID) | ORAL | Status: DC | PRN

## 2015-02-02 NOTE — Telephone Encounter (Signed)
gv and printed appt sched and avs for pt for April  °

## 2015-02-02 NOTE — Progress Notes (Signed)
[facility] [phone]   [phone]  OFFICE PROGRESS NOTE  [name], MD [place] [address]  PRINCIPAL DIAGNOSIS: Local recurrence of non-small cell lung cancer, adenocarcinoma, initially diagnosed as stage IB (T2a N0 M0) in March of 2010.   PRIOR THERAPY:  1. Status post right lower lobectomy under the care of Dr. Cyndia Bent on 04/08/2009. 2. Status post palliative radiotherapy to the right lower lobe recurrent lung mass under the care of Dr. Lisbeth Renshaw, completed on June 08, 2011. 3. Systemic chemotherapy with carboplatin for AUC of 5 and Alimta 500 mg/M2 every 3 weeks. She is status post 3 cycles.  CURRENT THERAPY: Tarceva 150 mg by mouth daily, therapy beginning 07/24/2012. Status post approximately 27 months of therapy.   CHEMOTHERAPY INTENT: Palliative  CURRENT # OF CHEMOTHERAPY CYCLES: 28 CURRENT ANTIEMETICS: Compazine  CURRENT SMOKING STATUS: Nonsmoker  ORAL CHEMOTHERAPY AND CONSENT: Tarceva  CURRENT BISPHOSPHONATES USE: None  PAIN MANAGEMENT: None  NARCOTICS INDUCED CONSTIPATION: None  LIVING WILL AND CODE STATUS: Full code  INTERVAL HISTORY: [name] 54 y.o. female returns to the clinic today for follow up visit accompanied by her interpreter. She is tolerating her treatment with Tarceva fairly well with no significant adverse effects except for the mild skin rash and infrequent episodes of diarrhea. She is traveling overseas to her native country in early April 2016. She received all the required vaccination from the health department. She is expected to come back on 03/20/2015. She is requesting refill of her medications until she comes back. She denied having any significant chest pain, shortness of breath,  no hemoptysis. The patient denied having any significant weight loss or night sweats. She denied having any nausea or vomiting , fever or chills.   MEDICAL HISTORY: Past Medical History  Diagnosis Date    . History of radiation therapy 05/02/11 to 06/08/11    right lung  . Lung cancer     right lower lobe adenocarcinoma    ALLERGIES:  is allergic to codeine and doxycycline.  MEDICATIONS:  Current Outpatient Prescriptions  Medication Sig Dispense Refill  . albuterol (PROVENTIL HFA;VENTOLIN HFA) 108 (90 BASE) MCG/ACT inhaler Inhale 2 puffs into the lungs every 6 (six) hours as needed for wheezing. 1 Inhaler 0  . chlorpheniramine-HYDROcodone (TUSSIONEX) 10-8 MG/5ML LQCR Take 5 mLs by mouth every 12 (twelve) hours as needed for cough. 115 mL 0  . clindamycin (CLEOCIN T) 1 % lotion APPLY TOPICALLY 2 (TWO) TIMES DAILY. 60 mL 0  . erlotinib (TARCEVA) 150 MG tablet Take 1 tablet (150 mg total) by mouth daily. 90 tablet 0  . fluticasone (CUTIVATE) 0.05 % cream Apply 1 application topically daily.     Marland Kitchen lidocaine-prilocaine (EMLA) cream Apply to Port-A-Cath prior to tx PRN 5 g prn 1 year  . naproxen sodium (ANAPROX) 220 MG tablet Take 440 mg by mouth 2 (two) times daily with a meal.     No current facility-administered medications for this visit.    SURGICAL HISTORY:  Past Surgical History  Procedure Laterality Date  . Lung lobectomy  04/18/2009    RLL  . Portacath placement  02/29/2012    Procedure: INSERTION PORT-A-CATH;  Surgeon: Nicanor Alcon, MD;  Location: [facility];  Service: Thoracic;  Laterality: Left;  PowerPort 8 F attachable in left internal jugular.    REVIEW OF SYSTEMS:  Constitutional: negative Eyes: negative Ears, nose, mouth, throat, and face: negative Respiratory: negative Cardiovascular: negative Gastrointestinal:  negative Genitourinary:negative Integument/breast: negative Hematologic/lymphatic: negative Musculoskeletal:negative Neurological: negative Behavioral/Psych: negative Endocrine: negative Allergic/Immunologic: negative   PHYSICAL EXAMINATION: General appearance: alert, cooperative and no distress Head: Normocephalic, without obvious abnormality,  atraumatic Neck: no adenopathy, no JVD, supple, symmetrical, trachea midline and thyroid not enlarged, symmetric, no tenderness/mass/nodules Lymph nodes: Cervical, supraclavicular, and axillary nodes normal. Resp: wheezes bilaterally Back: symmetric, no curvature. ROM normal. No CVA tenderness. Cardio: regular rate and rhythm, S1, S2 normal, no murmur, click, rub or gallop GI: soft, non-tender; bowel sounds normal; no masses,  no organomegaly Extremities: extremities normal, atraumatic, no cyanosis or edema Neurologic: Alert and oriented X 3, normal strength and tone. Normal symmetric reflexes. Normal coordination and gait  ECOG PERFORMANCE STATUS: 1 - Symptomatic but completely ambulatory  There were no vitals taken for this visit.  LABORATORY DATA: Lab Results  Component Value Date   WBC 7.8 02/02/2015   HGB 13.0 02/02/2015   HCT 40.1 02/02/2015   MCV 88.5 02/02/2015   PLT 302 02/02/2015      Chemistry      Component Value Date/Time   NA 142 01/05/2015 0847   NA 140 09/09/2014 0410   NA 141 01/06/2012 0804   K 4.2 01/05/2015 0847   K 3.9 09/09/2014 0410   K 4.6 01/06/2012 0804   CL 102 09/09/2014 0410   CL 102 04/30/2013 1002   CL 98 01/06/2012 0804   CO2 28 01/05/2015 0847   CO2 28 09/09/2014 0410   CO2 28 01/06/2012 0804   BUN 14.5 01/05/2015 0847   BUN 8 09/09/2014 0410   BUN 10 01/06/2012 0804   CREATININE 0.7 01/05/2015 0847   CREATININE 0.46* 09/09/2014 0410   CREATININE 0.6 01/06/2012 0804      Component Value Date/Time   CALCIUM 8.8 01/05/2015 0847   CALCIUM 8.7 09/09/2014 0410   CALCIUM 8.9 01/06/2012 0804   ALKPHOS 80 01/05/2015 0847   ALKPHOS 93 07/12/2012 1121   ALKPHOS 64 01/06/2012 0804   AST 20 01/05/2015 0847   AST 44* 07/12/2012 1121   AST 27 01/06/2012 0804   ALT 17 01/05/2015 0847   ALT 49* 07/12/2012 1121   ALT 24 01/06/2012 0804   BILITOT 0.30 01/05/2015 0847   BILITOT 0.3 07/12/2012 1121   BILITOT 0.60 01/06/2012 0804        RADIOGRAPHIC STUDIES: No results found.  ASSESSMENT AND PLAN: This is a very pleasant 53 years old Asian female with recurrent non-small cell lung cancer, adenocarcinoma. She is currently on treatment with oral Tarceva 150 mg by mouth daily status post 27 months of treatment. She is tolerating her treatment with Tarceva fairly well.  I recommended for the patient to continue with the current treatment with Tarceva 150 mg by mouth daily. For diarrhea, she will continue on Imodium on as-needed basis. For the chest wheezing, I advised the patient to continue with her inhaler every 6 hours as needed. She was given a refill today of albuterol, Hycodan and clindamycin lotion. She will come back for follow-up visit in 2 months for reevaluation with repeat blood work and repeat CT scan of the chest. She was advised to call immediately if she has any concerning symptoms in the interval. The patient voices understanding of current disease status and treatment options and is in agreement with the current care plan.  All questions were answered. The patient knows to call the clinic with any problems, questions or concerns. We can certainly see the patient much sooner if necessary.  Disclaimer:  This note was dictated with voice recognition software. Similar sounding words can inadvertently be transcribed and may not be corrected upon review.

## 2015-02-02 NOTE — Progress Notes (Signed)
Spoke w/ pt about financial concerns.  PT's insurance will end in a couple of months so I gave her a Medicaid application and the number to billing to set up payment arrangements for her balance.

## 2015-02-05 ENCOUNTER — Telehealth: Payer: Self-pay | Admitting: *Deleted

## 2015-02-05 ENCOUNTER — Other Ambulatory Visit: Payer: Self-pay | Admitting: *Deleted

## 2015-02-05 DIAGNOSIS — C3491 Malignant neoplasm of unspecified part of right bronchus or lung: Secondary | ICD-10-CM

## 2015-02-05 MED ORDER — HYDROCOD POLST-CHLORPHEN POLST 10-8 MG/5ML PO LQCR: 5.0000 mL | Freq: Two times a day (BID) | ORAL | Status: DC | PRN

## 2015-02-05 NOTE — Telephone Encounter (Signed)
PT. RECEIVED A PRESCRIPTION FOR THE HYDROCODONE-HOMATROPINE (HYCODAN) ON 02/02/15. HER SON SAID THE CHLORPHENIRAMINE-HYDROCODONE ([place]) WORKS BETTER FOR HIS MOTHER. PT.NEEDS A PRESCRIPTION FOR THE TUSSIONEX. THIS NOTE WAS SENT TO DR.[place]. CALL PT.'S SON WHEN PRESCRIPTION IS READY.

## 2015-02-05 NOTE — Telephone Encounter (Signed)
They will need to come back to have printed prescription

## 2015-02-05 NOTE — Telephone Encounter (Signed)
NOTIFIED PT.'S SON THAT HIS MOTHER'S PRESCRIPTION IS READY.

## 2015-02-16 ENCOUNTER — Telehealth: Payer: Self-pay | Admitting: Medical Oncology

## 2015-02-16 NOTE — Telephone Encounter (Signed)
Needs extra tarcevea-pt going to [place] on April 3 for 6 weeks. I instructed son to contact speciality pharmacy and let them know she needs extra tarceva

## 2015-02-18 ENCOUNTER — Telehealth: Payer: Self-pay | Admitting: Medical Oncology

## 2015-02-18 NOTE — Telephone Encounter (Signed)
Son was able to get enough tareva for his mom to take for 6 weeks in [place]

## 2015-03-20 ENCOUNTER — Telehealth: Payer: Self-pay | Admitting: Internal Medicine

## 2015-03-20 NOTE — Telephone Encounter (Signed)
pt son called to r/s moms appt due to being in [place]...transferred pt to radiology to r/s scan

## 2015-03-23 ENCOUNTER — Other Ambulatory Visit: Payer: Self-pay

## 2015-03-23 ENCOUNTER — Ambulatory Visit (HOSPITAL_COMMUNITY): Payer: Medicare Other

## 2015-03-25 ENCOUNTER — Ambulatory Visit: Payer: Self-pay | Admitting: Internal Medicine

## 2015-04-07 ENCOUNTER — Encounter (HOSPITAL_COMMUNITY): Payer: Self-pay

## 2015-04-07 ENCOUNTER — Ambulatory Visit (HOSPITAL_COMMUNITY)
Admission: RE | Admit: 2015-04-07 | Discharge: 2015-04-07 | Disposition: A | Discharge status: Home / Self Care | Payer: Medicare Other | Source: Ambulatory Visit | Attending: Internal Medicine | Admitting: Internal Medicine

## 2015-04-07 DIAGNOSIS — Z923 Personal history of irradiation: Secondary | ICD-10-CM | POA: Diagnosis not present

## 2015-04-07 DIAGNOSIS — R05 Cough: Secondary | ICD-10-CM | POA: Insufficient documentation

## 2015-04-07 DIAGNOSIS — Z85118 Personal history of other malignant neoplasm of bronchus and lung: Secondary | ICD-10-CM | POA: Diagnosis not present

## 2015-04-07 DIAGNOSIS — C3491 Malignant neoplasm of unspecified part of right bronchus or lung: Secondary | ICD-10-CM | POA: Diagnosis not present

## 2015-04-07 DIAGNOSIS — Z9221 Personal history of antineoplastic chemotherapy: Secondary | ICD-10-CM | POA: Insufficient documentation

## 2015-04-07 DIAGNOSIS — R0602 Shortness of breath: Secondary | ICD-10-CM | POA: Insufficient documentation

## 2015-04-07 DIAGNOSIS — Z9011 Acquired absence of right breast and nipple: Secondary | ICD-10-CM | POA: Diagnosis not present

## 2015-04-07 DIAGNOSIS — Z9889 Other specified postprocedural states: Secondary | ICD-10-CM | POA: Diagnosis not present

## 2015-04-07 MED ORDER — IOHEXOL 300 MG/ML  SOLN
100.0000 mL | Freq: Once | INTRAMUSCULAR | Status: AC | PRN
  Administered 2015-04-07: 80 mL via INTRAVENOUS

## 2015-04-14 ENCOUNTER — Other Ambulatory Visit (HOSPITAL_BASED_OUTPATIENT_CLINIC_OR_DEPARTMENT_OTHER): Payer: Medicare Other

## 2015-04-14 ENCOUNTER — Other Ambulatory Visit: Payer: Self-pay | Admitting: Medical Oncology

## 2015-04-14 ENCOUNTER — Ambulatory Visit (HOSPITAL_BASED_OUTPATIENT_CLINIC_OR_DEPARTMENT_OTHER): Payer: Medicare Other

## 2015-04-14 VITALS — BP 122/84 | HR 95 | Temp 98.6°F | Resp 17

## 2015-04-14 DIAGNOSIS — C3431 Malignant neoplasm of lower lobe, right bronchus or lung: Secondary | ICD-10-CM

## 2015-04-14 DIAGNOSIS — Z452 Encounter for adjustment and management of vascular access device: Secondary | ICD-10-CM

## 2015-04-14 DIAGNOSIS — Z95828 Presence of other vascular implants and grafts: Secondary | ICD-10-CM

## 2015-04-14 DIAGNOSIS — C3491 Malignant neoplasm of unspecified part of right bronchus or lung: Secondary | ICD-10-CM

## 2015-04-14 LAB — CBC WITH DIFFERENTIAL/PLATELET
BASO%: 0.8 % (ref 0.0–2.0)
Basophils Absolute: 0.1 10*3/uL (ref 0.0–0.1)
EOS%: 1.6 % (ref 0.0–7.0)
Eosinophils Absolute: 0.1 10*3/uL (ref 0.0–0.5)
HCT: 44 % (ref 34.8–46.6)
HGB: 14.7 g/dL (ref 11.6–15.9)
LYMPH%: 26.4 % (ref 14.0–49.7)
MCH: 28.1 pg (ref 25.1–34.0)
MCHC: 33.4 g/dL (ref 31.5–36.0)
MCV: 84.1 fL (ref 79.5–101.0)
MONO#: 0.5 10*3/uL (ref 0.1–0.9)
MONO%: 6.4 % (ref 0.0–14.0)
NEUT#: 5.4 10*3/uL (ref 1.5–6.5)
NEUT%: 64.8 % (ref 38.4–76.8)
Platelets: 346 10*3/uL (ref 145–400)
RBC: 5.24 10*6/uL (ref 3.70–5.45)
RDW: 14.1 % (ref 11.2–14.5)
WBC: 8.3 10*3/uL (ref 3.9–10.3)
lymph#: 2.2 10*3/uL (ref 0.9–3.3)

## 2015-04-14 LAB — COMPREHENSIVE METABOLIC PANEL (CC13)
ALT: 14 U/L (ref 0–55)
AST: 20 U/L (ref 5–34)
Albumin: 4.1 g/dL (ref 3.5–5.0)
Alkaline Phosphatase: 78 U/L (ref 40–150)
Anion Gap: 14 mEq/L — ABNORMAL HIGH (ref 3–11)
BUN: 9.8 mg/dL (ref 7.0–26.0)
CO2: 28 mEq/L (ref 22–29)
Calcium: 9.4 mg/dL (ref 8.4–10.4)
Chloride: 100 mEq/L (ref 98–109)
Creatinine: 0.6 mg/dL (ref 0.6–1.1)
EGFR: >90 mL/min/{1.73_m2} (ref >90)
Glucose: 82 mg/dl (ref 70–140)
Potassium: 3.9 mEq/L (ref 3.5–5.1)
Sodium: 142 mEq/L (ref 136–145)
Total Bilirubin: 0.95 mg/dL (ref 0.20–1.20)
Total Protein: 7.8 g/dL (ref 6.4–8.3)

## 2015-04-14 MED ORDER — ERLOTINIB HCL 150 MG PO TABS: 150.0000 mg | ORAL_TABLET | Freq: Every day | ORAL | Status: DC

## 2015-04-14 MED ORDER — HEPARIN SOD (PORK) LOCK FLUSH 100 UNIT/ML IV SOLN
500.0000 [IU] | Freq: Once | INTRAVENOUS | Status: AC
  Administered 2015-04-14: 500 [IU] via INTRAVENOUS
  Filled 2015-04-14: qty 5

## 2015-04-14 MED ORDER — LIDOCAINE-PRILOCAINE 2.5-2.5 % EX CREA: TOPICAL_CREAM | CUTANEOUS | Status: DC

## 2015-04-14 MED ORDER — SODIUM CHLORIDE 0.9 % IJ SOLN
10.0000 mL | INTRAMUSCULAR | Status: DC | PRN
Start: 2015-04-14 — End: 2015-04-14
  Administered 2015-04-14: 10 mL via INTRAVENOUS
  Filled 2015-04-14: qty 10

## 2015-04-14 NOTE — Progress Notes (Signed)
Pt requested refill for Tarceva and Emla cream. Pt has upcoming appt with Dr.[name] on 5/24. Request brought to Dr. Julien Nordmann nurse.

## 2015-04-14 NOTE — Patient Instructions (Signed)

## 2015-04-16 ENCOUNTER — Telehealth: Payer: Self-pay | Admitting: Medical Oncology

## 2015-04-16 NOTE — Telephone Encounter (Addendum)
Son notified. He said someone here told him pt does not qualify for pt assis stance for tarceva because pt has medicare. Son stated he cannot find her medicare card , Note sent to raquel to send copy of medicare card to pt or son.

## 2015-04-16 NOTE — Telephone Encounter (Signed)
-----   Message from Curt Bears, MD sent at 04/15/2015  5:55 PM EDT ----- Regarding: RE: no coverage for tarceva They need to keep trying with managed care to get the drug. ----- Message -----    From: Ardeen Garland, RN    Sent: 04/14/2015   8:56 AM      To: Curt Bears, MD Subject: no coverage for [place] lapsed. She cannot afford tarceva . She has 3 pills left. She is working on [place] for assistance and applying for [employer].. Son concerned if it is okay for her to be without medicine? Not sure what else to tell him.

## 2015-04-21 ENCOUNTER — Encounter: Payer: Self-pay | Admitting: Internal Medicine

## 2015-04-21 ENCOUNTER — Encounter: Payer: Self-pay | Admitting: *Deleted

## 2015-04-21 ENCOUNTER — Ambulatory Visit (HOSPITAL_BASED_OUTPATIENT_CLINIC_OR_DEPARTMENT_OTHER): Payer: Medicare Other | Admitting: Internal Medicine

## 2015-04-21 ENCOUNTER — Telehealth: Payer: Self-pay | Admitting: Internal Medicine

## 2015-04-21 ENCOUNTER — Ambulatory Visit (HOSPITAL_BASED_OUTPATIENT_CLINIC_OR_DEPARTMENT_OTHER): Payer: Medicare Other

## 2015-04-21 VITALS — BP 123/89 | HR 97 | Temp 97.9°F | Resp 18 | Ht 60.0 in | Wt 121.5 lb

## 2015-04-21 DIAGNOSIS — C3491 Malignant neoplasm of unspecified part of right bronchus or lung: Secondary | ICD-10-CM

## 2015-04-21 DIAGNOSIS — R05 Cough: Secondary | ICD-10-CM | POA: Diagnosis not present

## 2015-04-21 DIAGNOSIS — R197 Diarrhea, unspecified: Secondary | ICD-10-CM

## 2015-04-21 DIAGNOSIS — C3431 Malignant neoplasm of lower lobe, right bronchus or lung: Secondary | ICD-10-CM

## 2015-04-21 DIAGNOSIS — Z79899 Other long term (current) drug therapy: Secondary | ICD-10-CM | POA: Diagnosis present

## 2015-04-21 LAB — URINALYSIS, MICROSCOPIC - CHCC
Bilirubin (Urine): NEGATIVE
Glucose: NEGATIVE mg/dL
Ketones: NEGATIVE mg/dL
Nitrite: NEGATIVE
Protein: NEGATIVE mg/dL
Specific Gravity, Urine: 1.01 (ref 1.003–1.035)
Urobilinogen, UR: 0.2 mg/dL (ref 0.2–1)
pH: 6 (ref 4.6–8.0)

## 2015-04-21 MED ORDER — HYDROCOD POLST-CPM POLST ER 10-8 MG/5ML PO SUER: 5.0000 mL | Freq: Two times a day (BID) | ORAL | Status: DC | PRN

## 2015-04-21 NOTE — Progress Notes (Signed)
I placed genetech form for poss asst with Tarceva on desk of nurse for dr. Mckinley Jewel. I spoke with patient and interptr. She will fax or bring me bck proof of income from bank(statement)

## 2015-04-21 NOTE — [facility] Oncology Navigator Note (Unsigned)
Oncology Nurse Navigator Documentation  Oncology Nurse Navigator Flowsheets 04/21/2015  Patient Visit Type Follow-up  Treatment Phase Other patient is on observation for now.  She is in need of helping to figure out medicare concerns.  I asked Gaspar Bidding about speaking with patient and interpreter to help.  She did  Barriers/Navigation Needs Financial  Interventions Other  Time Spent with Patient 15

## 2015-04-21 NOTE — Progress Notes (Signed)
[facility] [phone]   Fax:(336) [phone]  OFFICE PROGRESS NOTE  [name], MD [place] [address]  PRINCIPAL DIAGNOSIS: Local recurrence of non-small cell lung cancer, adenocarcinoma, initially diagnosed as stage IB (T2a N0 M0) in March of 2010.   PRIOR THERAPY:  1. Status post right lower lobectomy under the care of Dr. Cyndia Bent on 04/08/2009. 2. Status post palliative radiotherapy to the right lower lobe recurrent lung mass under the care of Dr. Lisbeth Renshaw, completed on June 08, 2011. 3. Systemic chemotherapy with carboplatin for AUC of 5 and Alimta 500 mg/M2 every 3 weeks. She is status post 3 cycles.  CURRENT THERAPY: Tarceva 150 mg by mouth daily, therapy beginning 07/24/2012. Status post approximately 28 months of therapy.   CHEMOTHERAPY INTENT: Palliative  CURRENT # OF CHEMOTHERAPY CYCLES: 28 CURRENT ANTIEMETICS: Compazine  CURRENT SMOKING STATUS: Nonsmoker  ORAL CHEMOTHERAPY AND CONSENT: Tarceva  CURRENT BISPHOSPHONATES USE: None  PAIN MANAGEMENT: None  NARCOTICS INDUCED CONSTIPATION: None  LIVING WILL AND CODE STATUS: Full code  INTERVAL HISTORY: [name] 54 y.o. female returns to the clinic today for follow up visit accompanied by her interpreter. She has been tolerating her treatment with Tarceva fairly well with no significant adverse effects except for the mild skin rash and infrequent episodes of diarrhea. She came back from trip to her native country, [place]. She enjoyed her time there. Unfortunately she was unable to renew her treatment with Tarceva secondary to insurance issues after her husband lost his insurance coverage. She does not know if she is covered by Medicare at this point. She denied having any significant chest pain, shortness of breath,  no hemoptysis. The patient denied having any significant weight loss or night sweats. She denied having any nausea or vomiting , fever or chills. She had repeat  CT scan of the chest performed recently and she is here for evaluation and discussion of her scan results.  MEDICAL HISTORY: Past Medical History  Diagnosis Date  . History of radiation therapy 05/02/11 to 06/08/11    right lung  . Lung cancer     right lower lobe adenocarcinoma    ALLERGIES:  is allergic to codeine and doxycycline.  MEDICATIONS:  Current Outpatient Prescriptions  Medication Sig Dispense Refill  . albuterol (PROVENTIL HFA;VENTOLIN HFA) 108 (90 BASE) MCG/ACT inhaler Inhale 2 puffs into the lungs every 6 (six) hours as needed for wheezing. 1 Inhaler 0  . chlorpheniramine-HYDROcodone (TUSSIONEX) 10-8 MG/5ML LQCR Take 5 mLs by mouth every 12 (twelve) hours as needed for cough. 115 mL 0  . clindamycin (CLEOCIN T) 1 % lotion Apply topically 2 (two) times daily. 60 mL 0  . erlotinib (TARCEVA) 150 MG tablet Take 1 tablet (150 mg total) by mouth daily. 90 tablet 0  . fluticasone (CUTIVATE) 0.05 % cream Apply 1 application topically daily.     Marland Kitchen HYDROcodone-homatropine (HYCODAN) 5-1.5 MG/5ML syrup Take 5 mLs by mouth every 6 (six) hours as needed for cough. 120 mL 0  . lidocaine-prilocaine (EMLA) cream Apply to Port-A-Cath prior to tx PRN 5 g prn 1 year  . naproxen sodium (ANAPROX) 220 MG tablet Take 440 mg by mouth 2 (two) times daily with a meal.     No current facility-administered medications for this visit.    SURGICAL HISTORY:  Past Surgical History  Procedure Laterality Date  . Lung lobectomy  04/18/2009    RLL  . Portacath placement  02/29/2012  Procedure: INSERTION PORT-A-CATH;  Surgeon: Nicanor Alcon, MD;  Location: [facility];  Service: Thoracic;  Laterality: Left;  PowerPort 8 F attachable in left internal jugular.    REVIEW OF SYSTEMS:  Constitutional: negative Eyes: negative Ears, nose, mouth, throat, and face: negative Respiratory: negative Cardiovascular: negative Gastrointestinal: negative Genitourinary:negative Integument/breast:  negative Hematologic/lymphatic: negative Musculoskeletal:negative Neurological: negative Behavioral/Psych: negative Endocrine: negative Allergic/Immunologic: negative   PHYSICAL EXAMINATION: General appearance: alert, cooperative and no distress Head: Normocephalic, without obvious abnormality, atraumatic Neck: no adenopathy, no JVD, supple, symmetrical, trachea midline and thyroid not enlarged, symmetric, no tenderness/mass/nodules Lymph nodes: Cervical, supraclavicular, and axillary nodes normal. Resp: wheezes bilaterally Back: symmetric, no curvature. ROM normal. No CVA tenderness. Cardio: regular rate and rhythm, S1, S2 normal, no murmur, click, rub or gallop GI: soft, non-tender; bowel sounds normal; no masses,  no organomegaly Extremities: extremities normal, atraumatic, no cyanosis or edema Neurologic: Alert and oriented X 3, normal strength and tone. Normal symmetric reflexes. Normal coordination and gait  ECOG PERFORMANCE STATUS: 1 - Symptomatic but completely ambulatory  There were no vitals taken for this visit.  LABORATORY DATA: Lab Results  Component Value Date   WBC 8.3 04/14/2015   HGB 14.7 04/14/2015   HCT 44.0 04/14/2015   MCV 84.1 04/14/2015   PLT 346 04/14/2015      Chemistry      Component Value Date/Time   NA 142 04/14/2015 0803   NA 140 09/09/2014 0410   NA 141 01/06/2012 0804   K 3.9 04/14/2015 0803   K 3.9 09/09/2014 0410   K 4.6 01/06/2012 0804   CL 102 09/09/2014 0410   CL 102 04/30/2013 1002   CL 98 01/06/2012 0804   CO2 28 04/14/2015 0803   CO2 28 09/09/2014 0410   CO2 28 01/06/2012 0804   BUN 9.8 04/14/2015 0803   BUN 8 09/09/2014 0410   BUN 10 01/06/2012 0804   CREATININE 0.6 04/14/2015 0803   CREATININE 0.46* 09/09/2014 0410   CREATININE 0.6 01/06/2012 0804      Component Value Date/Time   CALCIUM 9.4 04/14/2015 0803   CALCIUM 8.7 09/09/2014 0410   CALCIUM 8.9 01/06/2012 0804   ALKPHOS 78 04/14/2015 0803   ALKPHOS 93  07/12/2012 1121   ALKPHOS 64 01/06/2012 0804   AST 20 04/14/2015 0803   AST 44* 07/12/2012 1121   AST 27 01/06/2012 0804   ALT 14 04/14/2015 0803   ALT 49* 07/12/2012 1121   ALT 24 01/06/2012 0804   BILITOT 0.95 04/14/2015 0803   BILITOT 0.3 07/12/2012 1121   BILITOT 0.60 01/06/2012 0804       RADIOGRAPHIC STUDIES: Ct Chest W Contrast  04/07/2015   CLINICAL DATA:  54 year old female with history of lung cancer diagnosed in March 2010 status post right lower lobectomy, chemotherapy and radiation therapy. Cough and shortness of breath.  EXAM: CT CHEST WITH CONTRAST  TECHNIQUE: Multidetector CT imaging of the chest was performed during intravenous contrast administration.  CONTRAST:  52m OMNIPAQUE IOHEXOL 300 MG/ML  SOLN  COMPARISON:  Chest CT 12/05/2014.  FINDINGS: Mediastinum/Lymph Nodes: Heart size is normal. There is no significant pericardial fluid, thickening or pericardial calcification. No pathologically enlarged mediastinal or hilar lymph nodes. Esophagus is unremarkable in appearance. No axillary lymphadenopathy. Left internal jugular single-lumen power porta cath with tip terminating in the right atrium.  Lungs/Pleura: Postoperative changes of right lower lobectomy are noted. There is compensatory hyperexpansion of the remaining right middle and upper lobes. Focal area of architectural distortion and  nodularity in the posterior aspect of the right upper lobe is similar to the prior examination, most compatible with an area of postradiation scarring. No other suspicious appearing pulmonary nodules or masses are otherwise noted. No acute consolidative airspace disease. Small chronic right-sided pleural effusion and pleural thickening is unchanged compared to prior examinations.  Upper Abdomen: Unremarkable.  Musculoskeletal/Soft Tissues: Healing fractures of the posterior aspects of the right sixth and seventh ribs, similar to the prior examination. There are no aggressive appearing lytic or  blastic lesions noted in the visualized portions of the skeleton.  IMPRESSION: 1. Stable postoperative and postradiation therapy related changes in the right hemithorax, as above, including a small chronic right-sided pleural effusion. No findings to suggest local recurrence of disease or metastatic disease in the thorax at this time. 2. Additional incidental findings, as above, similar prior examinations.   Electronically Signed   By: Vinnie Langton M.D.   On: 04/07/2015 10:18    ASSESSMENT AND PLAN: This is a very pleasant 54 years old Asian female with recurrent non-small cell lung cancer, adenocarcinoma. She is currently on treatment with oral Tarceva 150 mg by mouth daily status post 28 months of treatment. She is tolerating her treatment with Tarceva fairly well.  Her recent CT scan of the chest showed no evidence for disease progression. I discussed the scan results with the patient today. I recommended for the patient to continue with the current treatment with Tarceva 150 mg by mouth daily but unfortunately she has issues with insurance coverage for the drug after her husband lost his insurance. I referred the patient to the managed care and financial assistant office at the [facility] to help with her medication. For the dry cough, and she was given a refill of Hycodan. For diarrhea, she will continue on Imodium on as-needed basis. For the chest wheezing, I advised the patient to continue with her inhaler every 6 hours as needed. She will come back for follow-up visit in 1 month for reevaluation with repeat blood work.. She was advised to call immediately if she has any concerning symptoms in the interval. The patient voices understanding of current disease status and treatment options and is in agreement with the current care plan.  All questions were answered. The patient knows to call the clinic with any problems, questions or concerns. We can certainly see the patient much sooner if  necessary.  Disclaimer: This note was dictated with voice recognition software. Similar sounding words can inadvertently be transcribed and may not be corrected upon review.

## 2015-04-21 NOTE — Telephone Encounter (Signed)
s.w. pt son and advised on June appt....ok adn aware

## 2015-05-19 ENCOUNTER — Telehealth: Payer: Self-pay | Admitting: Internal Medicine

## 2015-05-19 ENCOUNTER — Encounter: Payer: Self-pay | Admitting: Internal Medicine

## 2015-05-19 ENCOUNTER — Ambulatory Visit (HOSPITAL_BASED_OUTPATIENT_CLINIC_OR_DEPARTMENT_OTHER): Payer: Medicare Other

## 2015-05-19 ENCOUNTER — Ambulatory Visit (HOSPITAL_BASED_OUTPATIENT_CLINIC_OR_DEPARTMENT_OTHER): Payer: Medicare Other | Admitting: Internal Medicine

## 2015-05-19 ENCOUNTER — Other Ambulatory Visit (HOSPITAL_BASED_OUTPATIENT_CLINIC_OR_DEPARTMENT_OTHER): Payer: Medicare Other

## 2015-05-19 VITALS — BP 123/86 | HR 91 | Temp 98.2°F | Resp 18 | Ht 60.0 in | Wt 125.3 lb

## 2015-05-19 DIAGNOSIS — Z95828 Presence of other vascular implants and grafts: Secondary | ICD-10-CM

## 2015-05-19 DIAGNOSIS — C3431 Malignant neoplasm of lower lobe, right bronchus or lung: Secondary | ICD-10-CM | POA: Diagnosis not present

## 2015-05-19 LAB — COMPREHENSIVE METABOLIC PANEL (CC13)
ALT: 20 U/L (ref 0–55)
AST: 22 U/L (ref 5–34)
Albumin: 3.9 g/dL (ref 3.5–5.0)
Alkaline Phosphatase: 96 U/L (ref 40–150)
Anion Gap: 7 mEq/L (ref 3–11)
BUN: 10.9 mg/dL (ref 7.0–26.0)
CO2: 31 mEq/L — ABNORMAL HIGH (ref 22–29)
Calcium: 9.3 mg/dL (ref 8.4–10.4)
Chloride: 102 mEq/L (ref 98–109)
Creatinine: 0.6 mg/dL (ref 0.6–1.1)
EGFR: >90 mL/min/{1.73_m2} (ref >90)
Glucose: 79 mg/dl (ref 70–140)
Potassium: 4.1 mEq/L (ref 3.5–5.1)
Sodium: 140 mEq/L (ref 136–145)
Total Bilirubin: 0.33 mg/dL (ref 0.20–1.20)
Total Protein: 7.5 g/dL (ref 6.4–8.3)

## 2015-05-19 LAB — CBC WITH DIFFERENTIAL/PLATELET
BASO%: 0.7 % (ref 0.0–2.0)
Basophils Absolute: 0.1 10*3/uL (ref 0.0–0.1)
EOS%: 1.4 % (ref 0.0–7.0)
Eosinophils Absolute: 0.1 10*3/uL (ref 0.0–0.5)
HCT: 41 % (ref 34.8–46.6)
HGB: 13.5 g/dL (ref 11.6–15.9)
LYMPH%: 28.4 % (ref 14.0–49.7)
MCH: 27.4 pg (ref 25.1–34.0)
MCHC: 32.8 g/dL (ref 31.5–36.0)
MCV: 83.6 fL (ref 79.5–101.0)
MONO#: 0.6 10*3/uL (ref 0.1–0.9)
MONO%: 6.7 % (ref 0.0–14.0)
NEUT#: 5.4 10*3/uL (ref 1.5–6.5)
NEUT%: 62.8 % (ref 38.4–76.8)
Platelets: 357 10*3/uL (ref 145–400)
RBC: 4.91 10*6/uL (ref 3.70–5.45)
RDW: 14.2 % (ref 11.2–14.5)
WBC: 8.5 10*3/uL (ref 3.9–10.3)
lymph#: 2.4 10*3/uL (ref 0.9–3.3)

## 2015-05-19 MED ORDER — HEPARIN SOD (PORK) LOCK FLUSH 100 UNIT/ML IV SOLN
500.0000 [IU] | Freq: Once | INTRAVENOUS | Status: AC
  Administered 2015-05-19: 500 [IU] via INTRAVENOUS
  Filled 2015-05-19: qty 5

## 2015-05-19 MED ORDER — SODIUM CHLORIDE 0.9 % IJ SOLN
10.0000 mL | INTRAMUSCULAR | Status: DC | PRN
  Administered 2015-05-19: 10 mL via INTRAVENOUS
  Filled 2015-05-19: qty 10

## 2015-05-19 NOTE — Telephone Encounter (Signed)
Gave and printed appt sched and avs for pt for July and Aug °

## 2015-05-19 NOTE — Patient Instructions (Signed)

## 2015-05-19 NOTE — Progress Notes (Signed)
[place] [phone]   Fax:([phone]  OFFICE PROGRESS NOTE  [name], MD [place] [address]  PRINCIPAL DIAGNOSIS: Local recurrence of non-small cell lung cancer, adenocarcinoma, initially diagnosed as stage IB (T2a N0 M0) in March of 2010.   PRIOR THERAPY:  1. Status post right lower lobectomy under the care of Dr. Cyndia Bent on 04/08/2009. 2. Status post palliative radiotherapy to the right lower lobe recurrent lung mass under the care of Dr. Lisbeth Renshaw, completed on June 08, 2011. 3. Systemic chemotherapy with carboplatin for AUC of 5 and Alimta 500 mg/M2 every 3 weeks. She is status post 3 cycles.  CURRENT THERAPY: Tarceva 150 mg by mouth daily, therapy beginning 07/24/2012. Status post approximately 28 months of therapy.   CHEMOTHERAPY INTENT: Palliative  CURRENT # OF CHEMOTHERAPY CYCLES: 28 CURRENT ANTIEMETICS: Compazine  CURRENT SMOKING STATUS: Nonsmoker  ORAL CHEMOTHERAPY AND CONSENT: Tarceva  CURRENT BISPHOSPHONATES USE: None  PAIN MANAGEMENT: None  NARCOTICS INDUCED CONSTIPATION: None  LIVING WILL AND CODE STATUS: Full code  INTERVAL HISTORY: [name] 54 y.o. female returns to the clinic today for follow up visit accompanied by her interpreter. The patient is doing fine today with no specific complaints. Unfortunately she was unable to renew her treatment with Tarceva secondary to insurance issues after her husband lost his insurance coverage. She applied for Medicare but has not received her Medicare card yet. She is very worried and concerned about inability to get her medication. She denied having any significant chest pain, shortness of breath,  no hemoptysis. The patient denied having any significant weight loss or night sweats. She denied having any nausea or vomiting , fever or chills.   MEDICAL HISTORY: Past Medical History  Diagnosis Date  . History of radiation therapy 05/02/11 to 06/08/11    right lung   . Lung cancer     right lower lobe adenocarcinoma    ALLERGIES:  is allergic to codeine and doxycycline.  MEDICATIONS:  Current Outpatient Prescriptions  Medication Sig Dispense Refill  . albuterol (PROVENTIL HFA;VENTOLIN HFA) 108 (90 BASE) MCG/ACT inhaler Inhale 2 puffs into the lungs every 6 (six) hours as needed for wheezing. 1 Inhaler 0  . chlorpheniramine-HYDROcodone (TUSSIONEX) 10-8 MG/5ML LQCR Take 5 mLs by mouth every 12 (twelve) hours as needed for cough. 115 mL 0  . chlorpheniramine-HYDROcodone (TUSSIONEX) 10-8 MG/5ML SUER Take 5 mLs by mouth every 12 (twelve) hours as needed for cough. 140 mL 0  . clindamycin (CLEOCIN T) 1 % lotion Apply topically 2 (two) times daily. 60 mL 0  . erlotinib (TARCEVA) 150 MG tablet Take 1 tablet (150 mg total) by mouth daily. 90 tablet 0  . fluticasone (CUTIVATE) 0.05 % cream Apply 1 application topically daily.     Marland Kitchen lidocaine-prilocaine (EMLA) cream Apply to Port-A-Cath prior to tx PRN 5 g prn 1 year  . naproxen sodium (ANAPROX) 220 MG tablet Take 440 mg by mouth 2 (two) times daily with a meal.     No current facility-administered medications for this visit.   Facility-Administered Medications Ordered in Other Visits  Medication Dose Route Frequency Provider Last Rate Last Dose  . sodium chloride 0.9 % injection 10 mL  10 mL Intravenous PRN Curt Bears, MD   10 mL at 05/19/15 1206    SURGICAL HISTORY:  Past Surgical History  Procedure Laterality Date  . Lung lobectomy  04/18/2009    RLL  . Portacath placement  02/29/2012  Procedure: INSERTION PORT-A-CATH;  Surgeon: Nicanor Alcon, MD;  Location: [place];  Service: Thoracic;  Laterality: Left;  PowerPort 8 F attachable in left internal jugular.    REVIEW OF SYSTEMS:  Constitutional: negative Eyes: negative Ears, nose, mouth, throat, and face: negative Respiratory: negative Cardiovascular: negative Gastrointestinal: negative Genitourinary:negative Integument/breast:  negative Hematologic/lymphatic: negative Musculoskeletal:negative Neurological: negative Behavioral/Psych: negative Endocrine: negative Allergic/Immunologic: negative   PHYSICAL EXAMINATION: General appearance: alert, cooperative and no distress Head: Normocephalic, without obvious abnormality, atraumatic Neck: no adenopathy, no JVD, supple, symmetrical, trachea midline and thyroid not enlarged, symmetric, no tenderness/mass/nodules Lymph nodes: Cervical, supraclavicular, and axillary nodes normal. Resp: wheezes bilaterally Back: symmetric, no curvature. ROM normal. No CVA tenderness. Cardio: regular rate and rhythm, S1, S2 normal, no murmur, click, rub or gallop GI: soft, non-tender; bowel sounds normal; no masses,  no organomegaly Extremities: extremities normal, atraumatic, no cyanosis or edema Neurologic: Alert and oriented X 3, normal strength and tone. Normal symmetric reflexes. Normal coordination and gait  ECOG PERFORMANCE STATUS: 1 - Symptomatic but completely ambulatory  Blood pressure 123/86, pulse 91, temperature 98.2 F (36.8 C), temperature source Oral, resp. rate 18, height 5' (1.524 m), weight 125 lb 4.8 oz (56.836 kg), SpO2 99 %.  LABORATORY DATA: Lab Results  Component Value Date   WBC 8.5 05/19/2015   HGB 13.5 05/19/2015   HCT 41.0 05/19/2015   MCV 83.6 05/19/2015   PLT 357 05/19/2015      Chemistry      Component Value Date/Time   NA 140 05/19/2015 1103   NA 140 09/09/2014 0410   NA 141 01/06/2012 0804   K 4.1 05/19/2015 1103   K 3.9 09/09/2014 0410   K 4.6 01/06/2012 0804   CL 102 09/09/2014 0410   CL 102 04/30/2013 1002   CL 98 01/06/2012 0804   CO2 31* 05/19/2015 1103   CO2 28 09/09/2014 0410   CO2 28 01/06/2012 0804   BUN 10.9 05/19/2015 1103   BUN 8 09/09/2014 0410   BUN 10 01/06/2012 0804   CREATININE 0.6 05/19/2015 1103   CREATININE 0.46* 09/09/2014 0410   CREATININE 0.6 01/06/2012 0804      Component Value Date/Time   CALCIUM 9.3  05/19/2015 1103   CALCIUM 8.7 09/09/2014 0410   CALCIUM 8.9 01/06/2012 0804   ALKPHOS 96 05/19/2015 1103   ALKPHOS 93 07/12/2012 1121   ALKPHOS 64 01/06/2012 0804   AST 22 05/19/2015 1103   AST 44* 07/12/2012 1121   AST 27 01/06/2012 0804   ALT 20 05/19/2015 1103   ALT 49* 07/12/2012 1121   ALT 24 01/06/2012 0804   BILITOT 0.33 05/19/2015 1103   BILITOT 0.3 07/12/2012 1121   BILITOT 0.60 01/06/2012 0804       RADIOGRAPHIC STUDIES: No results found.  ASSESSMENT AND PLAN: This is a very pleasant 54 years old Asian female with recurrent non-small cell lung cancer, adenocarcinoma. She is currently on treatment with oral Tarceva 150 mg by mouth daily status post 28 months of treatment. She is tolerating her treatment with Tarceva fairly well.  Her recent CT scan of the chest showed no evidence for disease progression. I discussed the scan results with the patient today. I recommended for the patient to continue with the current treatment with Tarceva 150 mg by mouth daily but unfortunately she has issues with insurance coverage for the drug after her husband lost his insurance. I referred the patient to the managed care and financial assistant office at the [facility]  to help with her medication but unfortunately the patient still unable to receive her medication. I will personally contact the [place] representative to see if they can help with her medication coverage. For the chest wheezing, I advised the patient to continue with her inhaler every 6 hours as needed. She will come back for follow-up visit in 6 weeks for reevaluation with repeat blood work as well as CT scan of the chest for restaging of her disease. She was advised to call immediately if she has any concerning symptoms in the interval. The patient voices understanding of current disease status and treatment options and is in agreement with the current care plan.  All questions were answered. The patient knows to call  the clinic with any problems, questions or concerns. We can certainly see the patient much sooner if necessary.  Disclaimer: This note was dictated with voice recognition software. Similar sounding words can inadvertently be transcribed and may not be corrected upon review.

## 2015-05-21 ENCOUNTER — Encounter: Payer: Self-pay | Admitting: Internal Medicine

## 2015-05-21 NOTE — Progress Notes (Signed)
The son Ra called back. Just his mom and dad----neither one of them are working. I advised him I will send in application for possible asst with Tarceva for his mom. U faxed to [employer]

## 2015-05-21 NOTE — Progress Notes (Signed)
I called [phone] and was not able to leave a message. No vmail has been set up. I need to get income amt, for possible asst with tarceva per [employer].

## 2015-06-22 ENCOUNTER — Other Ambulatory Visit: Payer: Self-pay | Admitting: Medical Oncology

## 2015-06-22 DIAGNOSIS — C3431 Malignant neoplasm of lower lobe, right bronchus or lung: Secondary | ICD-10-CM

## 2015-06-23 ENCOUNTER — Other Ambulatory Visit (HOSPITAL_BASED_OUTPATIENT_CLINIC_OR_DEPARTMENT_OTHER): Payer: Medicare Other

## 2015-06-23 DIAGNOSIS — C3431 Malignant neoplasm of lower lobe, right bronchus or lung: Secondary | ICD-10-CM | POA: Diagnosis present

## 2015-06-23 LAB — CBC WITH DIFFERENTIAL/PLATELET
BASO%: 0.4 % (ref 0.0–2.0)
Basophils Absolute: 0 10*3/uL (ref 0.0–0.1)
EOS%: 0.9 % (ref 0.0–7.0)
Eosinophils Absolute: 0.1 10*3/uL (ref 0.0–0.5)
HCT: 37.6 % (ref 34.8–46.6)
HGB: 12.3 g/dL (ref 11.6–15.9)
LYMPH%: 26.1 % (ref 14.0–49.7)
MCH: 27.3 pg (ref 25.1–34.0)
MCHC: 32.8 g/dL (ref 31.5–36.0)
MCV: 83.2 fL (ref 79.5–101.0)
MONO#: 0.4 10*3/uL (ref 0.1–0.9)
MONO%: 5.9 % (ref 0.0–14.0)
NEUT#: 5 10*3/uL (ref 1.5–6.5)
NEUT%: 66.7 % (ref 38.4–76.8)
Platelets: 287 10*3/uL (ref 145–400)
RBC: 4.53 10*6/uL (ref 3.70–5.45)
RDW: 14.5 % (ref 11.2–14.5)
WBC: 7.4 10*3/uL (ref 3.9–10.3)
lymph#: 1.9 10*3/uL (ref 0.9–3.3)

## 2015-06-23 LAB — COMPREHENSIVE METABOLIC PANEL (CC13)
ALT: 23 U/L (ref 0–55)
AST: 28 U/L (ref 5–34)
Albumin: 3.6 g/dL (ref 3.5–5.0)
Alkaline Phosphatase: 79 U/L (ref 40–150)
Anion Gap: 7 mEq/L (ref 3–11)
BUN: 10.4 mg/dL (ref 7.0–26.0)
CO2: 29 mEq/L (ref 22–29)
Calcium: 9.1 mg/dL (ref 8.4–10.4)
Chloride: 105 mEq/L (ref 98–109)
Creatinine: 0.7 mg/dL (ref 0.6–1.1)
EGFR: >90 mL/min/{1.73_m2} (ref >90)
Glucose: 94 mg/dl (ref 70–140)
Potassium: 4.3 mEq/L (ref 3.5–5.1)
Sodium: 141 mEq/L (ref 136–145)
Total Bilirubin: 0.84 mg/dL (ref 0.20–1.20)
Total Protein: 6.9 g/dL (ref 6.4–8.3)

## 2015-06-30 ENCOUNTER — Telehealth: Payer: Self-pay | Admitting: Medical Oncology

## 2015-06-30 ENCOUNTER — Encounter: Payer: Self-pay | Admitting: Internal Medicine

## 2015-06-30 ENCOUNTER — Ambulatory Visit (HOSPITAL_BASED_OUTPATIENT_CLINIC_OR_DEPARTMENT_OTHER): Payer: Medicare Other | Admitting: Internal Medicine

## 2015-06-30 ENCOUNTER — Other Ambulatory Visit: Payer: Self-pay | Admitting: Medical Oncology

## 2015-06-30 ENCOUNTER — Telehealth: Payer: Self-pay | Admitting: Internal Medicine

## 2015-06-30 VITALS — BP 130/86 | HR 92 | Temp 98.3°F | Resp 17 | Ht 60.0 in | Wt 123.7 lb

## 2015-06-30 DIAGNOSIS — C3431 Malignant neoplasm of lower lobe, right bronchus or lung: Secondary | ICD-10-CM

## 2015-06-30 DIAGNOSIS — R062 Wheezing: Secondary | ICD-10-CM

## 2015-06-30 DIAGNOSIS — C3491 Malignant neoplasm of unspecified part of right bronchus or lung: Secondary | ICD-10-CM

## 2015-06-30 DIAGNOSIS — Z95828 Presence of other vascular implants and grafts: Secondary | ICD-10-CM

## 2015-06-30 DIAGNOSIS — R05 Cough: Secondary | ICD-10-CM | POA: Diagnosis not present

## 2015-06-30 MED ORDER — HYDROCOD POLST-CPM POLST ER 10-8 MG/5ML PO SUER: 5.0000 mL | Freq: Two times a day (BID) | ORAL | Status: DC | PRN

## 2015-06-30 MED ORDER — ERLOTINIB HCL 150 MG PO TABS: 150.0000 mg | ORAL_TABLET | Freq: Every day | ORAL | Status: DC

## 2015-06-30 MED ORDER — LIDOCAINE-PRILOCAINE 2.5-2.5 % EX CREA: TOPICAL_CREAM | CUTANEOUS | Status: DC

## 2015-06-30 MED ORDER — CLINDAMYCIN PHOSPHATE 1 % EX LOTN: TOPICAL_LOTION | Freq: Two times a day (BID) | CUTANEOUS | Status: DC

## 2015-06-30 NOTE — Progress Notes (Signed)
[place] [phone]   Fax:([phone]  OFFICE PROGRESS NOTE  [name], MD [place] [address]  PRINCIPAL DIAGNOSIS: Local recurrence of non-small cell lung cancer, adenocarcinoma, initially diagnosed as stage IB (T2a N0 M0) in March of 2010.   PRIOR THERAPY:  1. Status post right lower lobectomy under the care of Dr. Cyndia Bent on 04/08/2009. 2. Status post palliative radiotherapy to the right lower lobe recurrent lung mass under the care of Dr. Lisbeth Renshaw, completed on June 08, 2011. 3. Systemic chemotherapy with carboplatin for AUC of 5 and Alimta 500 mg/M2 every 3 weeks. She is status post 3 cycles.  CURRENT THERAPY: Tarceva 150 mg by mouth daily, therapy beginning 07/24/2012. Status post approximately 29 months of therapy.   CHEMOTHERAPY INTENT: Palliative  CURRENT # OF CHEMOTHERAPY CYCLES: 30 CURRENT ANTIEMETICS: Compazine  CURRENT SMOKING STATUS: Nonsmoker  ORAL CHEMOTHERAPY AND CONSENT: Tarceva  CURRENT BISPHOSPHONATES USE: None  PAIN MANAGEMENT: None  NARCOTICS INDUCED CONSTIPATION: None  LIVING WILL AND CODE STATUS: Full code  INTERVAL HISTORY: [name] 54 y.o. female returns to the clinic today for follow up visit accompanied by her interpreter. The patient is doing fine today with no specific complaints. She resumed her treatment with Tarceva last months and tolerating it fairly well except for one or 2 episodes of diarrhea today. She does not take Imodium because of the low frequency of the diarrhea. She continues to have dry cough and she is requesting refill of Tussionex. She denied having any significant chest pain, shortness of breath,  no hemoptysis. The patient denied having any significant weight loss or night sweats. She denied having any nausea or vomiting , fever or chills.   MEDICAL HISTORY: Past Medical History  Diagnosis Date  . History of radiation therapy 05/02/11 to 06/08/11    right lung  .  Lung cancer     right lower lobe adenocarcinoma    ALLERGIES:  is allergic to codeine and doxycycline.  MEDICATIONS:  Current Outpatient Prescriptions  Medication Sig Dispense Refill  . albuterol (PROVENTIL HFA;VENTOLIN HFA) 108 (90 BASE) MCG/ACT inhaler Inhale 2 puffs into the lungs every 6 (six) hours as needed for wheezing. 1 Inhaler 0  . chlorpheniramine-HYDROcodone (TUSSIONEX) 10-8 MG/5ML LQCR Take 5 mLs by mouth every 12 (twelve) hours as needed for cough. 115 mL 0  . clindamycin (CLEOCIN T) 1 % lotion Apply topically 2 (two) times daily. 60 mL 0  . erlotinib (TARCEVA) 150 MG tablet Take 1 tablet (150 mg total) by mouth daily. 90 tablet 0  . fluticasone (CUTIVATE) 0.05 % cream Apply 1 application topically daily.     Marland Kitchen lidocaine-prilocaine (EMLA) cream Apply to Port-A-Cath prior to tx PRN 5 g prn 1 year  . naproxen sodium (ANAPROX) 220 MG tablet Take 440 mg by mouth 2 (two) times daily with a meal.     No current facility-administered medications for this visit.    SURGICAL HISTORY:  Past Surgical History  Procedure Laterality Date  . Lung lobectomy  04/18/2009    RLL  . Portacath placement  02/29/2012    Procedure: INSERTION PORT-A-CATH;  Surgeon: Nicanor Alcon, MD;  Location: [place];  Service: Thoracic;  Laterality: Left;  PowerPort 8 F attachable in left internal jugular.    REVIEW OF SYSTEMS:  A comprehensive review of systems was negative except for: Respiratory: positive for cough   PHYSICAL EXAMINATION: General appearance: alert, cooperative and no distress Head:  Normocephalic, without obvious abnormality, atraumatic Neck: no adenopathy, no JVD, supple, symmetrical, trachea midline and thyroid not enlarged, symmetric, no tenderness/mass/nodules Lymph nodes: Cervical, supraclavicular, and axillary nodes normal. Resp: wheezes bilaterally Back: symmetric, no curvature. ROM normal. No CVA tenderness. Cardio: regular rate and rhythm, S1, S2 normal, no murmur, click, rub or  gallop GI: soft, non-tender; bowel sounds normal; no masses,  no organomegaly Extremities: extremities normal, atraumatic, no cyanosis or edema Neurologic: Alert and oriented X 3, normal strength and tone. Normal symmetric reflexes. Normal coordination and gait  ECOG PERFORMANCE STATUS: 1 - Symptomatic but completely ambulatory  Blood pressure 130/86, pulse 92, temperature 98.3 F (36.8 C), temperature source Oral, resp. rate 17, height 5' (1.524 m), weight 123 lb 11.2 oz (56.11 kg), SpO2 98 %.  LABORATORY DATA: Lab Results  Component Value Date   WBC 7.4 06/23/2015   HGB 12.3 06/23/2015   HCT 37.6 06/23/2015   MCV 83.2 06/23/2015   PLT 287 06/23/2015      Chemistry      Component Value Date/Time   NA 141 06/23/2015 0941   NA 140 09/09/2014 0410   NA 141 01/06/2012 0804   K 4.3 06/23/2015 0941   K 3.9 09/09/2014 0410   K 4.6 01/06/2012 0804   CL 102 09/09/2014 0410   CL 102 04/30/2013 1002   CL 98 01/06/2012 0804   CO2 29 06/23/2015 0941   CO2 28 09/09/2014 0410   CO2 28 01/06/2012 0804   BUN 10.4 06/23/2015 0941   BUN 8 09/09/2014 0410   BUN 10 01/06/2012 0804   CREATININE 0.7 06/23/2015 0941   CREATININE 0.46* 09/09/2014 0410   CREATININE 0.6 01/06/2012 0804      Component Value Date/Time   CALCIUM 9.1 06/23/2015 0941   CALCIUM 8.7 09/09/2014 0410   CALCIUM 8.9 01/06/2012 0804   ALKPHOS 79 06/23/2015 0941   ALKPHOS 93 07/12/2012 1121   ALKPHOS 64 01/06/2012 0804   AST 28 06/23/2015 0941   AST 44* 07/12/2012 1121   AST 27 01/06/2012 0804   ALT 23 06/23/2015 0941   ALT 49* 07/12/2012 1121   ALT 24 01/06/2012 0804   BILITOT 0.84 06/23/2015 0941   BILITOT 0.3 07/12/2012 1121   BILITOT 0.60 01/06/2012 0804       RADIOGRAPHIC STUDIES: No results found.  ASSESSMENT AND PLAN: This is a very pleasant 54 years old Asian female with recurrent non-small cell lung cancer, adenocarcinoma. She is currently on treatment with oral Tarceva 150 mg by mouth daily status  post 29 months of treatment. She is tolerating her treatment with Tarceva fairly well.  I recommended for the patient to continue with the current treatment with Tarceva 150 mg by mouth daily  For the chest wheezing, I advised the patient to continue with her inhaler every 6 hours as needed. For the cough, she was giving a refill of Tussionex. She will come back for follow-up visit in 4 weeks for reevaluation with repeat blood work as well as CT scan of the chest for restaging of her disease. She was advised to call immediately if she has any concerning symptoms in the interval. The patient voices understanding of current disease status and treatment options and is in agreement with the current care plan.  All questions were answered. The patient knows to call the clinic with any problems, questions or concerns. We can certainly see the patient much sooner if necessary.  Disclaimer: This note was dictated with voice recognition software. Similar sounding  words can inadvertently be transcribed and may not be corrected upon review.

## 2015-06-30 NOTE — Telephone Encounter (Signed)
Called in emla cream 

## 2015-06-30 NOTE — Telephone Encounter (Signed)
Pt confirmed labs/ov per 08/02 POF, gave pt avs and calendar.... KJ

## 2015-07-31 ENCOUNTER — Encounter (HOSPITAL_COMMUNITY): Payer: Self-pay

## 2015-07-31 ENCOUNTER — Ambulatory Visit (HOSPITAL_COMMUNITY)
Admission: RE | Admit: 2015-07-31 | Discharge: 2015-07-31 | Disposition: A | Discharge status: Home / Self Care | Payer: Medicare Other | Source: Ambulatory Visit | Attending: Internal Medicine | Admitting: Internal Medicine

## 2015-07-31 ENCOUNTER — Other Ambulatory Visit: Payer: Self-pay

## 2015-07-31 DIAGNOSIS — C3491 Malignant neoplasm of unspecified part of right bronchus or lung: Secondary | ICD-10-CM | POA: Insufficient documentation

## 2015-07-31 DIAGNOSIS — C799 Secondary malignant neoplasm of unspecified site: Secondary | ICD-10-CM | POA: Diagnosis not present

## 2015-07-31 DIAGNOSIS — R911 Solitary pulmonary nodule: Secondary | ICD-10-CM | POA: Diagnosis not present

## 2015-07-31 DIAGNOSIS — Z9889 Other specified postprocedural states: Secondary | ICD-10-CM | POA: Insufficient documentation

## 2015-07-31 DIAGNOSIS — K760 Fatty (change of) liver, not elsewhere classified: Secondary | ICD-10-CM | POA: Insufficient documentation

## 2015-07-31 DIAGNOSIS — Z95828 Presence of other vascular implants and grafts: Secondary | ICD-10-CM

## 2015-07-31 MED ORDER — IOHEXOL 300 MG/ML  SOLN
75.0000 mL | Freq: Once | INTRAMUSCULAR | Status: AC | PRN
  Administered 2015-07-31: 75 mL via INTRAVENOUS

## 2015-08-04 ENCOUNTER — Other Ambulatory Visit: Payer: Self-pay

## 2015-08-04 ENCOUNTER — Ambulatory Visit (HOSPITAL_COMMUNITY): Payer: Medicare Other

## 2015-08-10 DIAGNOSIS — Z01419 Encounter for gynecological examination (general) (routine) without abnormal findings: Secondary | ICD-10-CM | POA: Diagnosis not present

## 2015-08-10 DIAGNOSIS — Z1231 Encounter for screening mammogram for malignant neoplasm of breast: Secondary | ICD-10-CM | POA: Diagnosis not present

## 2015-08-10 DIAGNOSIS — Z13220 Encounter for screening for lipoid disorders: Secondary | ICD-10-CM | POA: Diagnosis not present

## 2015-08-10 DIAGNOSIS — Z6824 Body mass index (BMI) 24.0-24.9, adult: Secondary | ICD-10-CM | POA: Diagnosis not present

## 2015-08-11 ENCOUNTER — Telehealth: Payer: Self-pay | Admitting: Internal Medicine

## 2015-08-11 ENCOUNTER — Other Ambulatory Visit (HOSPITAL_BASED_OUTPATIENT_CLINIC_OR_DEPARTMENT_OTHER): Payer: Medicare Other

## 2015-08-11 ENCOUNTER — Ambulatory Visit (HOSPITAL_BASED_OUTPATIENT_CLINIC_OR_DEPARTMENT_OTHER): Payer: Medicare Other | Admitting: Internal Medicine

## 2015-08-11 ENCOUNTER — Ambulatory Visit (HOSPITAL_BASED_OUTPATIENT_CLINIC_OR_DEPARTMENT_OTHER): Payer: Medicare Other

## 2015-08-11 ENCOUNTER — Encounter: Payer: Self-pay | Admitting: Internal Medicine

## 2015-08-11 VITALS — BP 137/88 | HR 95 | Temp 98.8°F | Resp 18 | Ht 60.0 in | Wt 125.2 lb

## 2015-08-11 DIAGNOSIS — C3431 Malignant neoplasm of lower lobe, right bronchus or lung: Secondary | ICD-10-CM

## 2015-08-11 DIAGNOSIS — Z95828 Presence of other vascular implants and grafts: Secondary | ICD-10-CM

## 2015-08-11 DIAGNOSIS — C3491 Malignant neoplasm of unspecified part of right bronchus or lung: Secondary | ICD-10-CM

## 2015-08-11 LAB — CBC WITH DIFFERENTIAL/PLATELET
BASO%: 0.2 % (ref 0.0–2.0)
Basophils Absolute: 0 10*3/uL (ref 0.0–0.1)
EOS%: 0.8 % (ref 0.0–7.0)
Eosinophils Absolute: 0.1 10*3/uL (ref 0.0–0.5)
HCT: 40.3 % (ref 34.8–46.6)
HGB: 12.9 g/dL (ref 11.6–15.9)
LYMPH%: 23.1 % (ref 14.0–49.7)
MCH: 27.8 pg (ref 25.1–34.0)
MCHC: 32 g/dL (ref 31.5–36.0)
MCV: 86.9 fL (ref 79.5–101.0)
MONO#: 0.4 10*3/uL (ref 0.1–0.9)
MONO%: 7 % (ref 0.0–14.0)
NEUT#: 4.4 10*3/uL (ref 1.5–6.5)
NEUT%: 68.9 % (ref 38.4–76.8)
Platelets: 293 10*3/uL (ref 145–400)
RBC: 4.64 10*6/uL (ref 3.70–5.45)
RDW: 14.3 % (ref 11.2–14.5)
WBC: 6.3 10*3/uL (ref 3.9–10.3)
lymph#: 1.5 10*3/uL (ref 0.9–3.3)

## 2015-08-11 LAB — COMPREHENSIVE METABOLIC PANEL (CC13)
ALT: 26 U/L (ref 0–55)
AST: 29 U/L (ref 5–34)
Albumin: 3.9 g/dL (ref 3.5–5.0)
Alkaline Phosphatase: 89 U/L (ref 40–150)
Anion Gap: 7 mEq/L (ref 3–11)
BUN: 9.8 mg/dL (ref 7.0–26.0)
CO2: 29 mEq/L (ref 22–29)
Calcium: 9.4 mg/dL (ref 8.4–10.4)
Chloride: 105 mEq/L (ref 98–109)
Creatinine: 0.6 mg/dL (ref 0.6–1.1)
EGFR: >90 mL/min/{1.73_m2} (ref >90)
Glucose: 97 mg/dl (ref 70–140)
Potassium: 4 mEq/L (ref 3.5–5.1)
Sodium: 142 mEq/L (ref 136–145)
Total Bilirubin: 0.61 mg/dL (ref 0.20–1.20)
Total Protein: 7.4 g/dL (ref 6.4–8.3)

## 2015-08-11 MED ORDER — HEPARIN SOD (PORK) LOCK FLUSH 100 UNIT/ML IV SOLN
500.0000 [IU] | Freq: Once | INTRAVENOUS | Status: AC
  Administered 2015-08-11: 500 [IU] via INTRAVENOUS
  Filled 2015-08-11: qty 5

## 2015-08-11 MED ORDER — LIDOCAINE-PRILOCAINE 2.5-2.5 % EX CREA: TOPICAL_CREAM | CUTANEOUS | Status: DC

## 2015-08-11 MED ORDER — SODIUM CHLORIDE 0.9 % IJ SOLN
10.0000 mL | INTRAMUSCULAR | Status: DC | PRN
  Administered 2015-08-11: 10 mL via INTRAVENOUS
  Filled 2015-08-11: qty 10

## 2015-08-11 NOTE — Patient Instructions (Signed)

## 2015-08-11 NOTE — Progress Notes (Signed)
[place] [phone]   [phone]  OFFICE PROGRESS NOTE  [name], MD [place] [address]  PRINCIPAL DIAGNOSIS: Local recurrence of non-small cell lung cancer, adenocarcinoma, initially diagnosed as stage IB (T2a N0 M0) in March of 2010.   PRIOR THERAPY:  1. Status post right lower lobectomy under the care of Dr. Cyndia Bent on 04/08/2009. 2. Status post palliative radiotherapy to the right lower lobe recurrent lung mass under the care of Dr. Lisbeth Renshaw, completed on June 08, 2011. 3. Systemic chemotherapy with carboplatin for AUC of 5 and Alimta 500 mg/M2 every 3 weeks. She is status post 3 cycles.  CURRENT THERAPY: Tarceva 150 mg by mouth daily, therapy beginning 07/24/2012. Status post approximately 30 months of therapy.   CHEMOTHERAPY INTENT: Palliative  CURRENT # OF CHEMOTHERAPY CYCLES: 31 CURRENT ANTIEMETICS: Compazine  CURRENT SMOKING STATUS: Nonsmoker  ORAL CHEMOTHERAPY AND CONSENT: Tarceva  CURRENT BISPHOSPHONATES USE: None  PAIN MANAGEMENT: None  NARCOTICS INDUCED CONSTIPATION: None  LIVING WILL AND CODE STATUS: Full code  INTERVAL HISTORY: [name] 54 y.o. female returns to the clinic today for follow up visit accompanied by her son. The patient is doing fine today with no specific complaints except for dry skin and dry nostrils. She is currently on treatment with Tarceva 150 mg by mouth daily and is tolerating it fairly well except for occasional diarrhea. Her cough is much better compared to months ago. She denied having any significant chest pain, shortness of breath,  no hemoptysis. The patient denied having any significant weight loss or night sweats. She denied having any nausea or vomiting , fever or chills. She had repeat CT scan of the chest performed recently and she is here for evaluation and discussion of her scan results.  MEDICAL HISTORY: Past Medical History  Diagnosis Date  . History of  radiation therapy 05/02/11 to 06/08/11    right lung  . Lung cancer     right lower lobe adenocarcinoma    ALLERGIES:  is allergic to codeine and doxycycline.  MEDICATIONS:  Current Outpatient Prescriptions  Medication Sig Dispense Refill  . albuterol (PROVENTIL HFA;VENTOLIN HFA) 108 (90 BASE) MCG/ACT inhaler Inhale 2 puffs into the lungs every 6 (six) hours as needed for wheezing. 1 Inhaler 0  . chlorpheniramine-HYDROcodone (TUSSIONEX) 10-8 MG/5ML SUER Take 5 mLs by mouth every 12 (twelve) hours as needed for cough. 140 mL 0  . clindamycin (CLEOCIN T) 1 % lotion Apply topically 2 (two) times daily. 60 mL 0  . erlotinib (TARCEVA) 150 MG tablet Take 1 tablet (150 mg total) by mouth daily. 90 tablet 0  . fluticasone (CUTIVATE) 0.05 % cream Apply 1 application topically daily.     Marland Kitchen lidocaine-prilocaine (EMLA) cream Apply to Port-A-Cath prior to tx PRN 5 g prn 1 year  . naproxen sodium (ANAPROX) 220 MG tablet Take 440 mg by mouth 2 (two) times daily with a meal.     No current facility-administered medications for this visit.    SURGICAL HISTORY:  Past Surgical History  Procedure Laterality Date  . Lung lobectomy  04/18/2009    RLL  . Portacath placement  02/29/2012    Procedure: INSERTION PORT-A-CATH;  Surgeon: Nicanor Alcon, MD;  Location: [facility];  Service: Thoracic;  Laterality: Left;  PowerPort 8 F attachable in left internal jugular.    REVIEW OF SYSTEMS:  Constitutional: negative Eyes: negative Ears, nose, mouth, throat, and face: negative Respiratory: positive for  cough Cardiovascular: negative Gastrointestinal: negative Genitourinary:negative Integument/breast: positive for dryness Hematologic/lymphatic: negative Musculoskeletal:negative Neurological: negative Behavioral/Psych: negative Endocrine: negative Allergic/Immunologic: negative   PHYSICAL EXAMINATION: General appearance: alert, cooperative and no distress Head: Normocephalic, without obvious abnormality,  atraumatic Neck: no adenopathy, no JVD, supple, symmetrical, trachea midline and thyroid not enlarged, symmetric, no tenderness/mass/nodules Lymph nodes: Cervical, supraclavicular, and axillary nodes normal. Resp: wheezes bilaterally Back: symmetric, no curvature. ROM normal. No CVA tenderness. Cardio: regular rate and rhythm, S1, S2 normal, no murmur, click, rub or gallop GI: soft, non-tender; bowel sounds normal; no masses,  no organomegaly Extremities: extremities normal, atraumatic, no cyanosis or edema Neurologic: Alert and oriented X 3, normal strength and tone. Normal symmetric reflexes. Normal coordination and gait  ECOG PERFORMANCE STATUS: 1 - Symptomatic but completely ambulatory  Blood pressure 137/88, pulse 95, temperature 98.8 F (37.1 C), temperature source Oral, resp. rate 18, height 5' (1.524 m), weight 125 lb 3.2 oz (56.79 kg), SpO2 100 %.  LABORATORY DATA: Lab Results  Component Value Date   WBC 6.3 08/11/2015   HGB 12.9 08/11/2015   HCT 40.3 08/11/2015   MCV 86.9 08/11/2015   PLT 293 08/11/2015      Chemistry      Component Value Date/Time   NA 141 06/23/2015 0941   NA 140 09/09/2014 0410   NA 141 01/06/2012 0804   K 4.3 06/23/2015 0941   K 3.9 09/09/2014 0410   K 4.6 01/06/2012 0804   CL 102 09/09/2014 0410   CL 102 04/30/2013 1002   CL 98 01/06/2012 0804   CO2 29 06/23/2015 0941   CO2 28 09/09/2014 0410   CO2 28 01/06/2012 0804   BUN 10.4 06/23/2015 0941   BUN 8 09/09/2014 0410   BUN 10 01/06/2012 0804   CREATININE 0.7 06/23/2015 0941   CREATININE 0.46* 09/09/2014 0410   CREATININE 0.6 01/06/2012 0804      Component Value Date/Time   CALCIUM 9.1 06/23/2015 0941   CALCIUM 8.7 09/09/2014 0410   CALCIUM 8.9 01/06/2012 0804   ALKPHOS 79 06/23/2015 0941   ALKPHOS 93 07/12/2012 1121   ALKPHOS 64 01/06/2012 0804   AST 28 06/23/2015 0941   AST 44* 07/12/2012 1121   AST 27 01/06/2012 0804   ALT 23 06/23/2015 0941   ALT 49* 07/12/2012 1121   ALT  24 01/06/2012 0804   BILITOT 0.84 06/23/2015 0941   BILITOT 0.3 07/12/2012 1121   BILITOT 0.60 01/06/2012 0804       RADIOGRAPHIC STUDIES: Ct Chest W Contrast  07/31/2015   CLINICAL DATA:  Right lung cancer restaging. Original diagnosis in 2010. Chemotherapy in progress. Prior right lower lobectomy. Prior radiation therapy.  EXAM: CT CHEST WITH CONTRAST  TECHNIQUE: Multidetector CT imaging of the chest was performed during intravenous contrast administration.  CONTRAST:  26m OMNIPAQUE IOHEXOL 300 MG/ML  SOLN  COMPARISON:  04/07/2015  FINDINGS: Mediastinum/Nodes: Stable stranding in the mediastinum, likely therapy related. Right epicardial lymph node 0.5 cm in short axis, stable. Scattered small mediastinal lymph nodes.  Lungs/Pleura: Chronic right pleural effusion with enhancing pleural margin. Chronic right perihilar volume loss and radiation fibrosis related findings.  On image 27 series 5, we demonstrate day 1.0 by 0.6 by 0.5 cm right middle lobe nodule which is increasing in prominence. There may have been some flat linear density resembling scarring in this location on the prior exam but the appearance is progressive.  Upper abdomen: Diffuse hepatic steatosis.  Musculoskeletal: Old left clavicular deformity likely from an old  fracture. Right sixth and seventh rib fractures as shown on prior exams.  IMPRESSION: 1. Appearance concerning for developing pulmonary nodule in the right middle lobe, 1.0 by 0.6 by 0.5 cm, visible on image 27 series 5 and image 33 series 603. Although possibly inflammatory or infectious, the possibility of recurrent malignancy is not excluded. Careful surveillance recommended. 2. Otherwise stable post therapy related findings. 3. Diffuse hepatic steatosis.   Electronically Signed   By: Van Clines M.D.   On: 07/31/2015 11:28    ASSESSMENT AND PLAN: This is a very pleasant 54 years old Asian female with recurrent non-small cell lung cancer, adenocarcinoma. She is  currently on treatment with oral Tarceva 150 mg by mouth daily status post 30 months of treatment. She is tolerating her treatment with Tarceva fairly well.  The recent CT scan of the chest showed no significant evidence for disease progression except for developing pulmonary nodule in the right middle lobe concerning for inflammatory process but the current malignancy is not excluded. I discussed the scan results and showed the images to the patient and her son. I recommended for her to continue her current treatment with Tarceva with the same dose. We will continue to monitor the new nodule on upcoming scan closely. For the chest wheezing, I advised the patient to continue with her inhaler every 6 hours as needed. For the cough, she was giving a refill of Tussionex. For the dry skin, the patient was advised to use lotions. She will come back for follow-up visit in 4 weeks for reevaluation with repeat blood work. She was advised to call immediately if she has any concerning symptoms in the interval. The patient voices understanding of current disease status and treatment options and is in agreement with the current care plan.  All questions were answered. The patient knows to call the clinic with any problems, questions or concerns. We can certainly see the patient much sooner if necessary.  Disclaimer: This note was dictated with voice recognition software. Similar sounding words can inadvertently be transcribed and may not be corrected upon review.

## 2015-08-11 NOTE — Telephone Encounter (Signed)
Pt confirmed labs/ov per 09/13 POF, gave pt AVS and Calendar.... KJ °

## 2015-09-03 ENCOUNTER — Other Ambulatory Visit: Payer: Self-pay | Admitting: Medical Oncology

## 2015-09-03 DIAGNOSIS — Z95828 Presence of other vascular implants and grafts: Secondary | ICD-10-CM

## 2015-09-03 DIAGNOSIS — C3491 Malignant neoplasm of unspecified part of right bronchus or lung: Secondary | ICD-10-CM

## 2015-09-03 MED ORDER — ERLOTINIB HCL 150 MG PO TABS: 150.0000 mg | ORAL_TABLET | Freq: Every day | ORAL | Status: DC

## 2015-09-03 NOTE — Progress Notes (Signed)
Son stated that [facility] called him and said they need rx for refill.

## 2015-09-15 ENCOUNTER — Ambulatory Visit (HOSPITAL_BASED_OUTPATIENT_CLINIC_OR_DEPARTMENT_OTHER): Payer: Medicare Other

## 2015-09-15 ENCOUNTER — Ambulatory Visit (HOSPITAL_BASED_OUTPATIENT_CLINIC_OR_DEPARTMENT_OTHER): Payer: Medicare Other | Admitting: Internal Medicine

## 2015-09-15 ENCOUNTER — Encounter: Payer: Self-pay | Admitting: *Deleted

## 2015-09-15 ENCOUNTER — Telehealth: Payer: Self-pay | Admitting: Internal Medicine

## 2015-09-15 ENCOUNTER — Other Ambulatory Visit (HOSPITAL_BASED_OUTPATIENT_CLINIC_OR_DEPARTMENT_OTHER): Payer: Medicare Other

## 2015-09-15 ENCOUNTER — Encounter: Payer: Self-pay | Admitting: Internal Medicine

## 2015-09-15 ENCOUNTER — Other Ambulatory Visit: Payer: Self-pay | Admitting: Medical Oncology

## 2015-09-15 VITALS — BP 120/76 | HR 100 | Temp 98.6°F | Resp 17 | Ht 60.0 in | Wt 126.7 lb

## 2015-09-15 DIAGNOSIS — Z95828 Presence of other vascular implants and grafts: Secondary | ICD-10-CM

## 2015-09-15 DIAGNOSIS — C3491 Malignant neoplasm of unspecified part of right bronchus or lung: Secondary | ICD-10-CM

## 2015-09-15 DIAGNOSIS — Z23 Encounter for immunization: Secondary | ICD-10-CM

## 2015-09-15 DIAGNOSIS — C3431 Malignant neoplasm of lower lobe, right bronchus or lung: Secondary | ICD-10-CM

## 2015-09-15 DIAGNOSIS — R05 Cough: Secondary | ICD-10-CM

## 2015-09-15 LAB — COMPREHENSIVE METABOLIC PANEL (CC13)
ALT: 24 U/L (ref 0–55)
AST: 25 U/L (ref 5–34)
Albumin: 3.9 g/dL (ref 3.5–5.0)
Alkaline Phosphatase: 82 U/L (ref 40–150)
Anion Gap: 9 mEq/L (ref 3–11)
BUN: 12.8 mg/dL (ref 7.0–26.0)
CO2: 29 mEq/L (ref 22–29)
Calcium: 9.2 mg/dL (ref 8.4–10.4)
Chloride: 103 mEq/L (ref 98–109)
Creatinine: 0.7 mg/dL (ref 0.6–1.1)
EGFR: >90 mL/min/{1.73_m2} (ref >90)
Glucose: 104 mg/dl (ref 70–140)
Potassium: 4 mEq/L (ref 3.5–5.1)
Sodium: 141 mEq/L (ref 136–145)
Total Bilirubin: 0.61 mg/dL (ref 0.20–1.20)
Total Protein: 7.5 g/dL (ref 6.4–8.3)

## 2015-09-15 LAB — CBC WITH DIFFERENTIAL/PLATELET
BASO%: 0.3 % (ref 0.0–2.0)
Basophils Absolute: 0 10*3/uL (ref 0.0–0.1)
EOS%: 1 % (ref 0.0–7.0)
Eosinophils Absolute: 0.1 10*3/uL (ref 0.0–0.5)
HCT: 41.4 % (ref 34.8–46.6)
HGB: 13.4 g/dL (ref 11.6–15.9)
LYMPH%: 25.2 % (ref 14.0–49.7)
MCH: 28.5 pg (ref 25.1–34.0)
MCHC: 32.4 g/dL (ref 31.5–36.0)
MCV: 87.9 fL (ref 79.5–101.0)
MONO#: 0.5 10*3/uL (ref 0.1–0.9)
MONO%: 6.7 % (ref 0.0–14.0)
NEUT#: 4.9 10*3/uL (ref 1.5–6.5)
NEUT%: 66.8 % (ref 38.4–76.8)
Platelets: 304 10*3/uL (ref 145–400)
RBC: 4.71 10*6/uL (ref 3.70–5.45)
RDW: 14.2 % (ref 11.2–14.5)
WBC: 7.3 10*3/uL (ref 3.9–10.3)
lymph#: 1.8 10*3/uL (ref 0.9–3.3)
nRBC: 0 % (ref 0–0)

## 2015-09-15 MED ORDER — HYDROCOD POLST-CPM POLST ER 10-8 MG/5ML PO SUER: 5.0000 mL | Freq: Two times a day (BID) | ORAL | Status: DC | PRN

## 2015-09-15 MED ORDER — SODIUM CHLORIDE 0.9 % IJ SOLN
10.0000 mL | INTRAMUSCULAR | Status: DC | PRN
  Administered 2015-09-15: 10 mL via INTRAVENOUS
  Filled 2015-09-15: qty 10

## 2015-09-15 MED ORDER — HEPARIN SOD (PORK) LOCK FLUSH 100 UNIT/ML IV SOLN
500.0000 [IU] | Freq: Once | INTRAVENOUS | Status: AC
  Administered 2015-09-15: 500 [IU] via INTRAVENOUS
  Filled 2015-09-15: qty 5

## 2015-09-15 MED ORDER — ALBUTEROL SULFATE HFA 108 (90 BASE) MCG/ACT IN AERS: 2.0000 | INHALATION_SPRAY | Freq: Four times a day (QID) | RESPIRATORY_TRACT | Status: DC | PRN

## 2015-09-15 MED ORDER — INFLUENZA VAC SPLIT QUAD 0.5 ML IM SUSY
0.5000 mL | PREFILLED_SYRINGE | Freq: Once | INTRAMUSCULAR | Status: AC
  Administered 2015-09-15: 0.5 mL via INTRAMUSCULAR
  Filled 2015-09-15: qty 0.5

## 2015-09-15 NOTE — Patient Instructions (Signed)

## 2015-09-15 NOTE — Progress Notes (Signed)
[place] [phone]   Fax:(336) [phone]  OFFICE PROGRESS NOTE  [name], MD [place] [id number]  PRINCIPAL DIAGNOSIS: Local recurrence of non-small cell lung cancer, adenocarcinoma, initially diagnosed as stage IB (T2a N0 M0) in March of 2010.   PRIOR THERAPY:  1. Status post right lower lobectomy under the care of Dr. Cyndia Bent on 04/08/2009. 2. Status post palliative radiotherapy to the right lower lobe recurrent lung mass under the care of Dr. Lisbeth Renshaw, completed on June 08, 2011. 3. Systemic chemotherapy with carboplatin for AUC of 5 and Alimta 500 mg/M2 every 3 weeks. She is status post 3 cycles.  CURRENT THERAPY: Tarceva 150 mg by mouth daily, therapy beginning 07/24/2012. Status post approximately 31 months of therapy.   CHEMOTHERAPY INTENT: Palliative  CURRENT # OF CHEMOTHERAPY CYCLES: 32 CURRENT ANTIEMETICS: Compazine  CURRENT SMOKING STATUS: Nonsmoker  ORAL CHEMOTHERAPY AND CONSENT: Tarceva  CURRENT BISPHOSPHONATES USE: None  PAIN MANAGEMENT: None  NARCOTICS INDUCED CONSTIPATION: None  LIVING WILL AND CODE STATUS: Full code  INTERVAL HISTORY: [name] 54 y.o. female returns to the clinic today for follow up visit accompanied by her interpreter. The patient continues to complain of dry cough and she is requesting refill of Tussionex. She is currently on treatment with Tarceva 150 mg by mouth daily and is tolerating it fairly well except for occasional diarrhea. She missed a few days of the treatment because refill was not on time. She denied having any significant chest pain, shortness of breath,  no hemoptysis. The patient denied having any significant weight loss or night sweats. She denied having any nausea or vomiting , fever or chills. She has repeat CBC and compliance metabolic panel performed earlier today and she is here for evaluation and discussion of her lab results.  MEDICAL HISTORY: Past Medical  History  Diagnosis Date  . History of radiation therapy 05/02/11 to 06/08/11    right lung  . Lung cancer ([facility])     right lower lobe adenocarcinoma    ALLERGIES:  is allergic to codeine and doxycycline.  MEDICATIONS:  Current Outpatient Prescriptions  Medication Sig Dispense Refill  . albuterol (PROVENTIL HFA;VENTOLIN HFA) 108 (90 BASE) MCG/ACT inhaler Inhale 2 puffs into the lungs every 6 (six) hours as needed for wheezing. 1 Inhaler 0  . chlorpheniramine-HYDROcodone (TUSSIONEX) 10-8 MG/5ML SUER Take 5 mLs by mouth every 12 (twelve) hours as needed for cough. 140 mL 0  . clindamycin (CLEOCIN T) 1 % lotion Apply topically 2 (two) times daily. 60 mL 0  . erlotinib (TARCEVA) 150 MG tablet Take 1 tablet (150 mg total) by mouth daily. 90 tablet 0  . lidocaine-prilocaine (EMLA) cream Apply to Port-A-Cath prior to tx PRN 5 g prn 1 year  . naproxen sodium (ANAPROX) 220 MG tablet Take 440 mg by mouth 2 (two) times daily with a meal.     No current facility-administered medications for this visit.    SURGICAL HISTORY:  Past Surgical History  Procedure Laterality Date  . Lung lobectomy  04/18/2009    RLL  . Portacath placement  02/29/2012    Procedure: INSERTION PORT-A-CATH;  Surgeon: Nicanor Alcon, MD;  Location: [facility];  Service: Thoracic;  Laterality: Left;  PowerPort 8 F attachable in left internal jugular.    REVIEW OF SYSTEMS:  Constitutional: negative Eyes: negative Ears, nose, mouth, throat, and face: negative Respiratory: positive for cough Cardiovascular: negative Gastrointestinal: negative Genitourinary:negative Integument/breast: positive for dryness  Hematologic/lymphatic: negative Musculoskeletal:negative Neurological: negative Behavioral/Psych: negative Endocrine: negative Allergic/Immunologic: negative   PHYSICAL EXAMINATION: General appearance: alert, cooperative and no distress Head: Normocephalic, without obvious abnormality, atraumatic Neck: no adenopathy, no JVD,  supple, symmetrical, trachea midline and thyroid not enlarged, symmetric, no tenderness/mass/nodules Lymph nodes: Cervical, supraclavicular, and axillary nodes normal. Resp: wheezes bilaterally Back: symmetric, no curvature. ROM normal. No CVA tenderness. Cardio: regular rate and rhythm, S1, S2 normal, no murmur, click, rub or gallop GI: soft, non-tender; bowel sounds normal; no masses,  no organomegaly Extremities: extremities normal, atraumatic, no cyanosis or edema Neurologic: Alert and oriented X 3, normal strength and tone. Normal symmetric reflexes. Normal coordination and gait  ECOG PERFORMANCE STATUS: 1 - Symptomatic but completely ambulatory  Blood pressure 120/76, pulse 100, temperature 98.6 F (37 C), temperature source Oral, resp. rate 17, height 5' (1.524 m), weight 126 lb 11.2 oz (57.471 kg), SpO2 98 %.  LABORATORY DATA: Lab Results  Component Value Date   WBC 7.3 09/15/2015   HGB 13.4 09/15/2015   HCT 41.4 09/15/2015   MCV 87.9 09/15/2015   PLT 304 09/15/2015      Chemistry      Component Value Date/Time   NA 142 08/11/2015 0900   NA 140 09/09/2014 0410   NA 141 01/06/2012 0804   K 4.0 08/11/2015 0900   K 3.9 09/09/2014 0410   K 4.6 01/06/2012 0804   CL 102 09/09/2014 0410   CL 102 04/30/2013 1002   CL 98 01/06/2012 0804   CO2 29 08/11/2015 0900   CO2 28 09/09/2014 0410   CO2 28 01/06/2012 0804   BUN 9.8 08/11/2015 0900   BUN 8 09/09/2014 0410   BUN 10 01/06/2012 0804   CREATININE 0.6 08/11/2015 0900   CREATININE 0.46* 09/09/2014 0410   CREATININE 0.6 01/06/2012 0804      Component Value Date/Time   CALCIUM 9.4 08/11/2015 0900   CALCIUM 8.7 09/09/2014 0410   CALCIUM 8.9 01/06/2012 0804   ALKPHOS 89 08/11/2015 0900   ALKPHOS 93 07/12/2012 1121   ALKPHOS 64 01/06/2012 0804   AST 29 08/11/2015 0900   AST 44* 07/12/2012 1121   AST 27 01/06/2012 0804   ALT 26 08/11/2015 0900   ALT 49* 07/12/2012 1121   ALT 24 01/06/2012 0804   BILITOT 0.61  08/11/2015 0900   BILITOT 0.3 07/12/2012 1121   BILITOT 0.60 01/06/2012 0804       RADIOGRAPHIC STUDIES: No results found.  ASSESSMENT AND PLAN: This is a very pleasant 53 years old Asian female with recurrent non-small cell lung cancer, adenocarcinoma. She is currently on treatment with oral Tarceva 150 mg by mouth daily status post 31 months of treatment. She is tolerating her treatment with Tarceva fairly well.  For the chest wheezing, I advised the patient to continue with her inhaler every 6 hours as needed. For the cough, she was giving a refill of Tussionex. For the dry skin, the patient was advised to use lotions. She will receive flu shot today. She will come back for follow-up visit in 4 weeks for reevaluation with repeat blood work. She was advised to call immediately if she has any concerning symptoms in the interval. The patient voices understanding of current disease status and treatment options and is in agreement with the current care plan.  All questions were answered. The patient knows to call the clinic with any problems, questions or concerns. We can certainly see the patient much sooner if necessary.  Disclaimer: This note  was dictated with voice recognition software. Similar sounding words can inadvertently be transcribed and may not be corrected upon review.

## 2015-09-15 NOTE — Progress Notes (Signed)
Oncology Nurse Navigator Documentation  Oncology Nurse Navigator Flowsheets 09/15/2015  Navigator Encounter Type Clinic/MDC/spoke with patient today with interpretor.  Patient is feeling well today.  She is wheezing and non productive cough.   She had some confusion about her medication. She ran out of medication.  Both Dr. Julien Nordmann and I spoke to her about how when to call to get refill on medication .    Patient Visit Type Follow-up  Treatment Phase Treatment  Barriers/Navigation Needs Education  Education Other  Time Spent with Patient 30

## 2015-09-15 NOTE — Telephone Encounter (Signed)
Gave adn printed appt sched and avs for pt for NOV.Kathryn Kitchenthe patient want to see Dr. Tyrell Antonio push out a week so she could see him

## 2015-10-09 ENCOUNTER — Other Ambulatory Visit: Payer: Self-pay | Admitting: *Deleted

## 2015-10-09 DIAGNOSIS — Z95828 Presence of other vascular implants and grafts: Secondary | ICD-10-CM

## 2015-10-09 DIAGNOSIS — C3491 Malignant neoplasm of unspecified part of right bronchus or lung: Secondary | ICD-10-CM

## 2015-10-09 MED ORDER — ERLOTINIB HCL 150 MG PO TABS: 150.0000 mg | ORAL_TABLET | Freq: Every day | ORAL | Status: DC

## 2015-10-09 NOTE — Telephone Encounter (Signed)
TC from pt's son requesting refill on pt's Tarceva through "[facility]". Son states she is out of her medication, though med list has it filled as of 09/03/15 for 90 tabs @ 1 x a day. Son states a prescription needs to be fax'd to [facility].

## 2015-10-10 NOTE — Telephone Encounter (Signed)
OK to refill

## 2015-10-13 NOTE — Telephone Encounter (Signed)
Pt's medication has been escribed on 11/11, faxed to [facility] 11/11 and refaxed 11/15. Confirmation received

## 2015-10-19 ENCOUNTER — Encounter: Payer: Self-pay | Admitting: Internal Medicine

## 2015-10-19 ENCOUNTER — Telehealth: Payer: Self-pay | Admitting: Internal Medicine

## 2015-10-19 ENCOUNTER — Ambulatory Visit (HOSPITAL_BASED_OUTPATIENT_CLINIC_OR_DEPARTMENT_OTHER): Payer: Medicare Other | Admitting: Internal Medicine

## 2015-10-19 ENCOUNTER — Other Ambulatory Visit (HOSPITAL_BASED_OUTPATIENT_CLINIC_OR_DEPARTMENT_OTHER): Payer: Medicare Other

## 2015-10-19 VITALS — BP 133/96 | HR 99 | Temp 98.9°F | Resp 18 | Ht 60.0 in | Wt 125.4 lb

## 2015-10-19 DIAGNOSIS — C3431 Malignant neoplasm of lower lobe, right bronchus or lung: Secondary | ICD-10-CM | POA: Diagnosis not present

## 2015-10-19 DIAGNOSIS — C3491 Malignant neoplasm of unspecified part of right bronchus or lung: Secondary | ICD-10-CM | POA: Diagnosis not present

## 2015-10-19 LAB — COMPREHENSIVE METABOLIC PANEL (CC13)
ALT: 65 U/L — ABNORMAL HIGH (ref 0–55)
AST: 53 U/L — ABNORMAL HIGH (ref 5–34)
Albumin: 4 g/dL (ref 3.5–5.0)
Alkaline Phosphatase: 113 U/L (ref 40–150)
Anion Gap: 10 mEq/L (ref 3–11)
BUN: 11.7 mg/dL (ref 7.0–26.0)
CO2: 28 mEq/L (ref 22–29)
Calcium: 9.8 mg/dL (ref 8.4–10.4)
Chloride: 103 mEq/L (ref 98–109)
Creatinine: 0.7 mg/dL (ref 0.6–1.1)
EGFR: >90 mL/min/{1.73_m2} (ref >90)
Glucose: 99 mg/dl (ref 70–140)
Potassium: 4.5 mEq/L (ref 3.5–5.1)
Sodium: 140 mEq/L (ref 136–145)
Total Bilirubin: 1.02 mg/dL (ref 0.20–1.20)
Total Protein: 7.9 g/dL (ref 6.4–8.3)

## 2015-10-19 LAB — CBC WITH DIFFERENTIAL/PLATELET
BASO%: 0.7 % (ref 0.0–2.0)
Basophils Absolute: 0.1 10*3/uL (ref 0.0–0.1)
EOS%: 0.9 % (ref 0.0–7.0)
Eosinophils Absolute: 0.1 10*3/uL (ref 0.0–0.5)
HCT: 43.2 % (ref 34.8–46.6)
HGB: 14 g/dL (ref 11.6–15.9)
LYMPH%: 27.1 % (ref 14.0–49.7)
MCH: 27.8 pg (ref 25.1–34.0)
MCHC: 32.5 g/dL (ref 31.5–36.0)
MCV: 85.6 fL (ref 79.5–101.0)
MONO#: 0.4 10*3/uL (ref 0.1–0.9)
MONO%: 5.4 % (ref 0.0–14.0)
NEUT#: 5.2 10*3/uL (ref 1.5–6.5)
NEUT%: 65.9 % (ref 38.4–76.8)
Platelets: 338 10*3/uL (ref 145–400)
RBC: 5.05 10*6/uL (ref 3.70–5.45)
RDW: 14.6 % — ABNORMAL HIGH (ref 11.2–14.5)
WBC: 7.8 10*3/uL (ref 3.9–10.3)
lymph#: 2.1 10*3/uL (ref 0.9–3.3)

## 2015-10-19 MED ORDER — DOXYCYCLINE HYCLATE 100 MG PO TABS: 100.0000 mg | ORAL_TABLET | Freq: Two times a day (BID) | ORAL | Status: DC

## 2015-10-19 MED ORDER — ALBUTEROL SULFATE HFA 108 (90 BASE) MCG/ACT IN AERS
2.0000 | INHALATION_SPRAY | Freq: Four times a day (QID) | RESPIRATORY_TRACT | Status: DC | PRN
Start: 2015-10-19 — End: 2016-04-18

## 2015-10-19 NOTE — Telephone Encounter (Signed)
per pof to sch pt appt-gave pt copy of avs-adv [facility] to call and make scaqn appt

## 2015-10-19 NOTE — Progress Notes (Signed)
[place] Telephone:([phone]) [phone]   [phone]  OFFICE PROGRESS NOTE  [name], MD [place] [id number]  PRINCIPAL DIAGNOSIS: Local recurrence of non-small cell lung cancer, adenocarcinoma, initially diagnosed as stage IB (T2a N0 M0) in March of 2010.   PRIOR THERAPY:  1. Status post right lower lobectomy under the care of Dr. Cyndia Bent on 04/08/2009. 2. Status post palliative radiotherapy to the right lower lobe recurrent lung mass under the care of Dr. Lisbeth Renshaw, completed on June 08, 2011. 3. Systemic chemotherapy with carboplatin for AUC of 5 and Alimta 500 mg/M2 every 3 weeks. She is status post 3 cycles.  CURRENT THERAPY: Tarceva 150 mg by mouth daily, therapy beginning 07/24/2012. Status post approximately 32 months of therapy.   CHEMOTHERAPY INTENT: Palliative  CURRENT # OF CHEMOTHERAPY CYCLES: 33 CURRENT ANTIEMETICS: Compazine  CURRENT SMOKING STATUS: Nonsmoker  ORAL CHEMOTHERAPY AND CONSENT: Tarceva  CURRENT BISPHOSPHONATES USE: None  PAIN MANAGEMENT: None  NARCOTICS INDUCED CONSTIPATION: None  LIVING WILL AND CODE STATUS: Full code  INTERVAL HISTORY: [name] 54 y.o. female returns to the clinic today for follow up visit accompanied by her interpreter. Her cough is better with to the next and ventolin. She is currently on treatment with Tarceva 150 mg by mouth daily and is tolerating it fairly well except for occasional diarrhea. She denied having any significant chest pain, shortness of breath,  no hemoptysis. The patient denied having any significant weight loss or night sweats. She denied having any nausea or vomiting , fever or chills. She has some pimples at the eyebrows. She has repeat CBC and compliance metabolic panel performed earlier today and she is here for evaluation and discussion of her lab results.  MEDICAL HISTORY: Past Medical History  Diagnosis Date  . History of radiation therapy 05/02/11 to 06/08/11      right lung  . Lung cancer ([place])     right lower lobe adenocarcinoma    ALLERGIES:  is allergic to codeine and doxycycline.  MEDICATIONS:  Current Outpatient Prescriptions  Medication Sig Dispense Refill  . albuterol (PROVENTIL HFA;VENTOLIN HFA) 108 (90 BASE) MCG/ACT inhaler Inhale 2 puffs into the lungs every 6 (six) hours as needed for wheezing. 1 Inhaler 0  . chlorpheniramine-HYDROcodone (TUSSIONEX) 10-8 MG/5ML SUER Take 5 mLs by mouth every 12 (twelve) hours as needed for cough. 140 mL 0  . clindamycin (CLEOCIN T) 1 % lotion Apply topically 2 (two) times daily. 60 mL 0  . erlotinib (TARCEVA) 150 MG tablet Take 1 tablet (150 mg total) by mouth daily. 30 tablet 0  . lidocaine-prilocaine (EMLA) cream Apply to Port-A-Cath prior to tx PRN 5 g prn 1 year  . naproxen sodium (ANAPROX) 220 MG tablet Take 440 mg by mouth 2 (two) times daily with a meal.     No current facility-administered medications for this visit.    SURGICAL HISTORY:  Past Surgical History  Procedure Laterality Date  . Lung lobectomy  04/18/2009    RLL  . Portacath placement  02/29/2012    Procedure: INSERTION PORT-A-CATH;  Surgeon: Nicanor Alcon, MD;  Location: [place];  Service: Thoracic;  Laterality: Left;  PowerPort 8 F attachable in left internal jugular.    REVIEW OF SYSTEMS:  Constitutional: negative Eyes: negative Ears, nose, mouth, throat, and face: negative Respiratory: positive for cough Cardiovascular: negative Gastrointestinal: negative Genitourinary:negative Integument/breast: positive for dryness Hematologic/lymphatic: negative Musculoskeletal:negative Neurological: negative Behavioral/Psych: negative Endocrine: negative Allergic/Immunologic: negative  PHYSICAL EXAMINATION: General appearance: alert, cooperative and no distress Head: Normocephalic, without obvious abnormality, atraumatic Neck: no adenopathy, no JVD, supple, symmetrical, trachea midline and thyroid not enlarged, symmetric,  no tenderness/mass/nodules Lymph nodes: Cervical, supraclavicular, and axillary nodes normal. Resp: wheezes bilaterally Back: symmetric, no curvature. ROM normal. No CVA tenderness. Cardio: regular rate and rhythm, S1, S2 normal, no murmur, click, rub or gallop GI: soft, non-tender; bowel sounds normal; no masses,  no organomegaly Extremities: extremities normal, atraumatic, no cyanosis or edema Neurologic: Alert and oriented X 3, normal strength and tone. Normal symmetric reflexes. Normal coordination and gait  ECOG PERFORMANCE STATUS: 1 - Symptomatic but completely ambulatory  Blood pressure 133/96, pulse 99, temperature 98.9 F (37.2 C), temperature source Oral, resp. rate 18, height 5' (1.524 m), weight 125 lb 6.4 oz (56.881 kg), SpO2 99 %.  LABORATORY DATA: Lab Results  Component Value Date   WBC 7.8 10/19/2015   HGB 14.0 10/19/2015   HCT 43.2 10/19/2015   MCV 85.6 10/19/2015   PLT 338 10/19/2015      Chemistry      Component Value Date/Time   NA 141 09/15/2015 0812   NA 140 09/09/2014 0410   NA 141 01/06/2012 0804   K 4.0 09/15/2015 0812   K 3.9 09/09/2014 0410   K 4.6 01/06/2012 0804   CL 102 09/09/2014 0410   CL 102 04/30/2013 1002   CL 98 01/06/2012 0804   CO2 29 09/15/2015 0812   CO2 28 09/09/2014 0410   CO2 28 01/06/2012 0804   BUN 12.8 09/15/2015 0812   BUN 8 09/09/2014 0410   BUN 10 01/06/2012 0804   CREATININE 0.7 09/15/2015 0812   CREATININE 0.46* 09/09/2014 0410   CREATININE 0.6 01/06/2012 0804      Component Value Date/Time   CALCIUM 9.2 09/15/2015 0812   CALCIUM 8.7 09/09/2014 0410   CALCIUM 8.9 01/06/2012 0804   ALKPHOS 82 09/15/2015 0812   ALKPHOS 93 07/12/2012 1121   ALKPHOS 64 01/06/2012 0804   AST 25 09/15/2015 0812   AST 44* 07/12/2012 1121   AST 27 01/06/2012 0804   ALT 24 09/15/2015 0812   ALT 49* 07/12/2012 1121   ALT 24 01/06/2012 0804   BILITOT 0.61 09/15/2015 0812   BILITOT 0.3 07/12/2012 1121   BILITOT 0.60 01/06/2012 0804        RADIOGRAPHIC STUDIES: No results found.  ASSESSMENT AND PLAN: This is a very pleasant 54 years old Asian female with recurrent non-small cell lung cancer, adenocarcinoma. She is currently on treatment with oral Tarceva 150 mg by mouth daily status post 32 months of treatment. She is tolerating her treatment with Tarceva fairly well.  For the chest wheezing, I advised the patient to continue with her inhaler every 6 hours as needed. For the cough, she was giving a refill of albuterol. For the dry skin, the patient was advised to use lotions. For the skin inflammation at the eyebrows, I started the patient on doxycycline 100 mg by mouth twice a day for 10 days. She will come back for follow-up visit in 4 weeks for reevaluation with repeat blood work as well as repeat CT scan of the chest. She was advised to call immediately if she has any concerning symptoms in the interval. The patient voices understanding of current disease status and treatment options and is in agreement with the current care plan.  All questions were answered. The patient knows to call the clinic with any problems, questions or concerns. We  can certainly see the patient much sooner if necessary.  Disclaimer: This note was dictated with voice recognition software. Similar sounding words can inadvertently be transcribed and may not be corrected upon review.

## 2015-11-04 ENCOUNTER — Other Ambulatory Visit: Payer: Self-pay | Admitting: Medical Oncology

## 2015-11-04 DIAGNOSIS — C3491 Malignant neoplasm of unspecified part of right bronchus or lung: Secondary | ICD-10-CM

## 2015-11-04 DIAGNOSIS — Z95828 Presence of other vascular implants and grafts: Secondary | ICD-10-CM

## 2015-11-06 ENCOUNTER — Other Ambulatory Visit: Payer: Self-pay | Admitting: Medical Oncology

## 2015-11-06 ENCOUNTER — Telehealth: Payer: Self-pay | Admitting: Medical Oncology

## 2015-11-06 DIAGNOSIS — C3491 Malignant neoplasm of unspecified part of right bronchus or lung: Secondary | ICD-10-CM

## 2015-11-06 DIAGNOSIS — Z95828 Presence of other vascular implants and grafts: Secondary | ICD-10-CM

## 2015-11-06 MED ORDER — ERLOTINIB HCL 150 MG PO TABS: 150.0000 mg | ORAL_TABLET | Freq: Every day | ORAL | Status: DC

## 2015-11-06 NOTE — Telephone Encounter (Signed)
Tarceva rx faxed to [facility]

## 2015-11-06 NOTE — Telephone Encounter (Signed)
rx sent to [facility] to sign

## 2015-11-16 ENCOUNTER — Ambulatory Visit (HOSPITAL_BASED_OUTPATIENT_CLINIC_OR_DEPARTMENT_OTHER): Payer: Medicare Other

## 2015-11-16 ENCOUNTER — Other Ambulatory Visit: Payer: Self-pay

## 2015-11-16 ENCOUNTER — Other Ambulatory Visit (HOSPITAL_BASED_OUTPATIENT_CLINIC_OR_DEPARTMENT_OTHER): Payer: Medicare Other

## 2015-11-16 ENCOUNTER — Encounter (HOSPITAL_COMMUNITY): Payer: Self-pay

## 2015-11-16 ENCOUNTER — Ambulatory Visit (HOSPITAL_COMMUNITY)
Admission: RE | Admit: 2015-11-16 | Discharge: 2015-11-16 | Disposition: A | Discharge status: Home / Self Care | Payer: Medicare Other | Source: Ambulatory Visit | Attending: Internal Medicine | Admitting: Internal Medicine

## 2015-11-16 VITALS — BP 125/76 | HR 95 | Temp 97.9°F | Resp 16

## 2015-11-16 DIAGNOSIS — C3431 Malignant neoplasm of lower lobe, right bronchus or lung: Secondary | ICD-10-CM

## 2015-11-16 DIAGNOSIS — Z9221 Personal history of antineoplastic chemotherapy: Secondary | ICD-10-CM | POA: Diagnosis not present

## 2015-11-16 DIAGNOSIS — Z9889 Other specified postprocedural states: Secondary | ICD-10-CM | POA: Insufficient documentation

## 2015-11-16 DIAGNOSIS — Z95828 Presence of other vascular implants and grafts: Secondary | ICD-10-CM

## 2015-11-16 DIAGNOSIS — J90 Pleural effusion, not elsewhere classified: Secondary | ICD-10-CM | POA: Insufficient documentation

## 2015-11-16 DIAGNOSIS — R911 Solitary pulmonary nodule: Secondary | ICD-10-CM | POA: Diagnosis not present

## 2015-11-16 LAB — CBC WITH DIFFERENTIAL/PLATELET
BASO%: 0.2 % (ref 0.0–2.0)
Basophils Absolute: 0 10e3/uL (ref 0.0–0.1)
EOS%: 0.9 % (ref 0.0–7.0)
Eosinophils Absolute: 0.1 10e3/uL (ref 0.0–0.5)
HCT: 40.5 % (ref 34.8–46.6)
HGB: 13.3 g/dL (ref 11.6–15.9)
LYMPH%: 23.4 % (ref 14.0–49.7)
MCH: 28.8 pg (ref 25.1–34.0)
MCHC: 32.8 g/dL (ref 31.5–36.0)
MCV: 87.7 fL (ref 79.5–101.0)
MONO#: 0.6 10e3/uL (ref 0.1–0.9)
MONO%: 7 % (ref 0.0–14.0)
NEUT#: 5.5 10e3/uL (ref 1.5–6.5)
NEUT%: 68.5 % (ref 38.4–76.8)
Platelets: 284 10e3/uL (ref 145–400)
RBC: 4.62 10e6/uL (ref 3.70–5.45)
RDW: 13.9 % (ref 11.2–14.5)
WBC: 8 10e3/uL (ref 3.9–10.3)
lymph#: 1.9 10e3/uL (ref 0.9–3.3)

## 2015-11-16 LAB — COMPREHENSIVE METABOLIC PANEL WITH GFR
ALT: 21 U/L (ref 0–55)
AST: 26 U/L (ref 5–34)
Albumin: 4 g/dL (ref 3.5–5.0)
Alkaline Phosphatase: 87 U/L (ref 40–150)
Anion Gap: 9 meq/L (ref 3–11)
BUN: 15 mg/dL (ref 7.0–26.0)
CO2: 29 meq/L (ref 22–29)
Calcium: 9.5 mg/dL (ref 8.4–10.4)
Chloride: 101 meq/L (ref 98–109)
Creatinine: 0.7 mg/dL (ref 0.6–1.1)
EGFR: >90 ml/min/1.73 m2
Glucose: 89 mg/dL (ref 70–140)
Potassium: 3.9 meq/L (ref 3.5–5.1)
Sodium: 138 meq/L (ref 136–145)
Total Bilirubin: 0.66 mg/dL (ref 0.20–1.20)
Total Protein: 7.7 g/dL (ref 6.4–8.3)

## 2015-11-16 MED ORDER — IOHEXOL 300 MG/ML  SOLN
75.0000 mL | Freq: Once | INTRAMUSCULAR | Status: AC | PRN
  Administered 2015-11-16: 75 mL via INTRAVENOUS

## 2015-11-16 MED ORDER — SODIUM CHLORIDE 0.9 % IJ SOLN
10.0000 mL | INTRAMUSCULAR | Status: DC | PRN
  Administered 2015-11-16: 10 mL via INTRAVENOUS
  Filled 2015-11-16: qty 10

## 2015-11-16 NOTE — Patient Instructions (Signed)

## 2015-11-16 NOTE — Progress Notes (Signed)
Patient in for labs and PAC flush today. All labs drawn from Patient's PAC. Patient's PAC left accessed for CT scan today. Instructed Patient and Patient's Interpreter to have PAC needle removed after CT scan today. Patient and Patient's Interpreter verbalized understanding.

## 2015-11-24 ENCOUNTER — Ambulatory Visit (HOSPITAL_BASED_OUTPATIENT_CLINIC_OR_DEPARTMENT_OTHER): Payer: Medicare Other | Admitting: Internal Medicine

## 2015-11-24 ENCOUNTER — Telehealth: Payer: Self-pay | Admitting: Internal Medicine

## 2015-11-24 ENCOUNTER — Encounter: Payer: Self-pay | Admitting: Internal Medicine

## 2015-11-24 VITALS — BP 139/81 | HR 104 | Temp 98.0°F | Resp 18 | Ht 60.0 in | Wt 126.9 lb

## 2015-11-24 DIAGNOSIS — R062 Wheezing: Secondary | ICD-10-CM | POA: Diagnosis not present

## 2015-11-24 DIAGNOSIS — C3431 Malignant neoplasm of lower lobe, right bronchus or lung: Secondary | ICD-10-CM | POA: Diagnosis not present

## 2015-11-24 DIAGNOSIS — R05 Cough: Secondary | ICD-10-CM

## 2015-11-24 NOTE — Telephone Encounter (Signed)
GAVE SON AVS REPORT AND APPOINTMENTS FOR January.

## 2015-11-24 NOTE — Progress Notes (Signed)
[place] Telephone:([phone]   Fax:([phone]  OFFICE PROGRESS NOTE  [name]., MD [place] [id number]  PRINCIPAL DIAGNOSIS: Local recurrence of non-small cell lung cancer, adenocarcinoma, initially diagnosed as stage IB (T2a N0 M0) in March of 2010.   PRIOR THERAPY:  1. Status post right lower lobectomy under the care of Dr. Cyndia Bent on 04/08/2009. 2. Status post palliative radiotherapy to the right lower lobe recurrent lung mass under the care of Dr. Lisbeth Renshaw, completed on June 08, 2011. 3. Systemic chemotherapy with carboplatin for AUC of 5 and Alimta 500 mg/M2 every 3 weeks. She is status post 3 cycles.  CURRENT THERAPY: Tarceva 150 mg by mouth daily, therapy beginning 07/24/2012. Status post approximately 33 months of therapy.   CHEMOTHERAPY INTENT: Palliative  CURRENT # OF CHEMOTHERAPY CYCLES: 34 CURRENT ANTIEMETICS: Compazine  CURRENT SMOKING STATUS: Nonsmoker  ORAL CHEMOTHERAPY AND CONSENT: Tarceva  CURRENT BISPHOSPHONATES USE: None  PAIN MANAGEMENT: None  NARCOTICS INDUCED CONSTIPATION: None  LIVING WILL AND CODE STATUS: Full code  INTERVAL HISTORY: [name] 54 y.o. female returns to the clinic today for follow up visit accompanied by her son. The patient is feeling fine today with no specific complaints except for the persistent dry cough which improves with Tussionex. She is currently on treatment with Tarceva 150 mg by mouth daily and is tolerating it fairly well except for occasional diarrhea. She denied having any significant chest pain, shortness of breath,  no hemoptysis. The patient denied having any significant weight loss or night sweats. She denied having any nausea or vomiting , fever or chills. She has some pimples at the eyebrows. She has repeat CBC and comprehensive metabolic panel as well as CT scan of the chest performed recently and she is here for evaluation and discussion of her lab and his scan  results.  MEDICAL HISTORY: Past Medical History  Diagnosis Date  . History of radiation therapy 05/02/11 to 06/08/11    right lung  . Lung cancer ([place])     right lower lobe adenocarcinoma    ALLERGIES:  is allergic to codeine and doxycycline.  MEDICATIONS:  Current Outpatient Prescriptions  Medication Sig Dispense Refill  . albuterol (PROVENTIL HFA;VENTOLIN HFA) 108 (90 BASE) MCG/ACT inhaler Inhale 2 puffs into the lungs every 6 (six) hours as needed for wheezing. 1 Inhaler 0  . chlorpheniramine-HYDROcodone (TUSSIONEX) 10-8 MG/5ML SUER Take 5 mLs by mouth every 12 (twelve) hours as needed for cough. 140 mL 0  . clindamycin (CLEOCIN T) 1 % lotion Apply topically 2 (two) times daily. 60 mL 0  . doxycycline (VIBRA-TABS) 100 MG tablet Take 1 tablet (100 mg total) by mouth 2 (two) times daily. 20 tablet 0  . erlotinib (TARCEVA) 150 MG tablet Take 1 tablet (150 mg total) by mouth daily. 30 tablet 2  . lidocaine-prilocaine (EMLA) cream Apply to Port-A-Cath prior to tx PRN 5 g prn 1 year  . naproxen sodium (ANAPROX) 220 MG tablet Take 440 mg by mouth 2 (two) times daily with a meal.     No current facility-administered medications for this visit.    SURGICAL HISTORY:  Past Surgical History  Procedure Laterality Date  . Lung lobectomy  04/18/2009    RLL  . Portacath placement  02/29/2012    Procedure: INSERTION PORT-A-CATH;  Surgeon: Nicanor Alcon, MD;  Location: [place];  Service: Thoracic;  Laterality: Left;  PowerPort 8 F attachable in left internal jugular.  REVIEW OF SYSTEMS:  Constitutional: negative Eyes: negative Ears, nose, mouth, throat, and face: negative Respiratory: positive for cough Cardiovascular: negative Gastrointestinal: negative Genitourinary:negative Integument/breast: positive for dryness Hematologic/lymphatic: negative Musculoskeletal:negative Neurological: negative Behavioral/Psych: negative Endocrine: negative Allergic/Immunologic: negative   PHYSICAL  EXAMINATION: General appearance: alert, cooperative and no distress Head: Normocephalic, without obvious abnormality, atraumatic Neck: no adenopathy, no JVD, supple, symmetrical, trachea midline and thyroid not enlarged, symmetric, no tenderness/mass/nodules Lymph nodes: Cervical, supraclavicular, and axillary nodes normal. Resp: wheezes bilaterally Back: symmetric, no curvature. ROM normal. No CVA tenderness. Cardio: regular rate and rhythm, S1, S2 normal, no murmur, click, rub or gallop GI: soft, non-tender; bowel sounds normal; no masses,  no organomegaly Extremities: extremities normal, atraumatic, no cyanosis or edema Neurologic: Alert and oriented X 3, normal strength and tone. Normal symmetric reflexes. Normal coordination and gait  ECOG PERFORMANCE STATUS: 1 - Symptomatic but completely ambulatory  Blood pressure 139/81, pulse 104, temperature 98 F (36.7 C), temperature source Oral, resp. rate 18, height 5' (1.524 m), weight 126 lb 14.4 oz (57.561 kg), SpO2 98 %.  LABORATORY DATA: Lab Results  Component Value Date   WBC 8.0 11/16/2015   HGB 13.3 11/16/2015   HCT 40.5 11/16/2015   MCV 87.7 11/16/2015   PLT 284 11/16/2015      Chemistry      Component Value Date/Time   NA 138 11/16/2015 1115   NA 140 09/09/2014 0410   NA 141 01/06/2012 0804   K 3.9 11/16/2015 1115   K 3.9 09/09/2014 0410   K 4.6 01/06/2012 0804   CL 102 09/09/2014 0410   CL 102 04/30/2013 1002   CL 98 01/06/2012 0804   CO2 29 11/16/2015 1115   CO2 28 09/09/2014 0410   CO2 28 01/06/2012 0804   BUN 15.0 11/16/2015 1115   BUN 8 09/09/2014 0410   BUN 10 01/06/2012 0804   CREATININE 0.7 11/16/2015 1115   CREATININE 0.46* 09/09/2014 0410   CREATININE 0.6 01/06/2012 0804      Component Value Date/Time   CALCIUM 9.5 11/16/2015 1115   CALCIUM 8.7 09/09/2014 0410   CALCIUM 8.9 01/06/2012 0804   ALKPHOS 87 11/16/2015 1115   ALKPHOS 93 07/12/2012 1121   ALKPHOS 64 01/06/2012 0804   AST 26 11/16/2015  1115   AST 44* 07/12/2012 1121   AST 27 01/06/2012 0804   ALT 21 11/16/2015 1115   ALT 49* 07/12/2012 1121   ALT 24 01/06/2012 0804   BILITOT 0.66 11/16/2015 1115   BILITOT 0.3 07/12/2012 1121   BILITOT 0.60 01/06/2012 0804       RADIOGRAPHIC STUDIES: Ct Chest W Contrast  11/16/2015  CLINICAL DATA:  Lung cancer, cough and shortness of breath. Chemotherapy pill in progress. EXAM: CT CHEST WITH CONTRAST TECHNIQUE: Multidetector CT imaging of the chest was performed during intravenous contrast administration. CONTRAST:  67m OMNIPAQUE IOHEXOL 300 MG/ML  SOLN COMPARISON:  07/31/2015. FINDINGS: Mediastinum/Nodes: Left IJ Port-A-Cath with terminates in the high right atrium. No pathologically enlarged mediastinal, left hilar or axillary lymph nodes. Heart size normal. No pericardial effusion. Juxta diaphragmatic lymph node is sub cm in short axis size. Lungs/Pleura: Postoperative and radiation changes in the posteromedial right hemi thorax are generally stable. There is a 13 mm nodular component inferiorly (image 23), which may be slightly larger than on 07/31/2015, at which time it measured measured approximately 9 mm. Chronic appearing small right pleural effusion, stable. Left lung is clear. Airway is otherwise unremarkable. Upper abdomen: Visualized portions of the liver,  gallbladder, adrenal glands, kidneys, spleen, pancreas, stomach and bowel are grossly unremarkable. No upper abdominal adenopathy. Musculoskeletal: No worrisome lytic or sclerotic lesions. Degenerative changes are seen in the spine. Thoracotomy changes on the right. Old left clavicle fracture. IMPRESSION: 1. Enlarging right middle lobe nodule, worrisome for disease recurrence. 2. Postsurgical and post treatment changes in the posteromedial right hemi thorax, with a stable appearing chronic right pleural effusion. Electronically Signed   By: Lorin Picket M.D.   On: 11/16/2015 14:27    ASSESSMENT AND PLAN: This is a very pleasant  54 years old Asian female with recurrent non-small cell lung cancer, adenocarcinoma. She is currently on treatment with oral Tarceva 150 mg by mouth daily status post 33 months of treatment. She is tolerating her treatment with Tarceva fairly well.  The recent CT scan of the chest showed no significant evidence for disease progression except for a slight increase of the right middle lobe nodule which increased by only 2-3 millimeter. I personally reviewed the images of the scan and compared to the previous one. I did not see any concerning findings to change her treatment at this point. I discussed the scan results and showed the images to the patient and her son. I recommended for her to continue her current treatment with Tarceva. For the chest wheezing, I advised the patient to continue with her inhaler every 6 hours as needed. For the cough, she will continue on albuterol. For the dry skin, the patient was advised to use lotions. She will come back for follow-up visit in 4 weeks for reevaluation with repeat blood work. She was advised to call immediately if she has any concerning symptoms in the interval. The patient voices understanding of current disease status and treatment options and is in agreement with the current care plan.  All questions were answered. The patient knows to call the clinic with any problems, questions or concerns. We can certainly see the patient much sooner if necessary.  Disclaimer: This note was dictated with voice recognition software. Similar sounding words can inadvertently be transcribed and may not be corrected upon review.

## 2015-12-01 ENCOUNTER — Telehealth: Payer: Self-pay | Admitting: Medical Oncology

## 2015-12-01 NOTE — Telephone Encounter (Signed)
Son reports [facility] is asking for med records . I gave son the fax number to our  Med records to  send [facility] request for records.

## 2015-12-15 ENCOUNTER — Encounter: Payer: Self-pay | Admitting: Internal Medicine

## 2015-12-15 NOTE — Progress Notes (Signed)
forms from state of [place] left in my chair 12/14/15.Notes from Jan 2016-present are to be faxed [phone].

## 2015-12-22 ENCOUNTER — Encounter: Payer: Self-pay | Admitting: Internal Medicine

## 2015-12-22 ENCOUNTER — Ambulatory Visit (HOSPITAL_BASED_OUTPATIENT_CLINIC_OR_DEPARTMENT_OTHER): Payer: Medicare Other | Admitting: Internal Medicine

## 2015-12-22 ENCOUNTER — Other Ambulatory Visit (HOSPITAL_BASED_OUTPATIENT_CLINIC_OR_DEPARTMENT_OTHER): Payer: Medicare Other

## 2015-12-22 ENCOUNTER — Telehealth: Payer: Self-pay | Admitting: Internal Medicine

## 2015-12-22 VITALS — BP 102/63 | HR 101 | Temp 98.3°F | Resp 18 | Ht 60.0 in | Wt 128.5 lb

## 2015-12-22 DIAGNOSIS — Z95828 Presence of other vascular implants and grafts: Secondary | ICD-10-CM

## 2015-12-22 DIAGNOSIS — R05 Cough: Secondary | ICD-10-CM

## 2015-12-22 DIAGNOSIS — C3431 Malignant neoplasm of lower lobe, right bronchus or lung: Secondary | ICD-10-CM | POA: Diagnosis not present

## 2015-12-22 DIAGNOSIS — C3491 Malignant neoplasm of unspecified part of right bronchus or lung: Secondary | ICD-10-CM

## 2015-12-22 LAB — CBC WITH DIFFERENTIAL/PLATELET
BASO%: 0.3 % (ref 0.0–2.0)
Basophils Absolute: 0 10*3/uL (ref 0.0–0.1)
EOS%: 1.3 % (ref 0.0–7.0)
Eosinophils Absolute: 0.1 10*3/uL (ref 0.0–0.5)
HCT: 41.3 % (ref 34.8–46.6)
HGB: 13.4 g/dL (ref 11.6–15.9)
LYMPH%: 24.4 % (ref 14.0–49.7)
MCH: 27.9 pg (ref 25.1–34.0)
MCHC: 32.4 g/dL (ref 31.5–36.0)
MCV: 86.1 fL (ref 79.5–101.0)
MONO#: 0.5 10*3/uL (ref 0.1–0.9)
MONO%: 6.9 % (ref 0.0–14.0)
NEUT#: 5.3 10*3/uL (ref 1.5–6.5)
NEUT%: 67.1 % (ref 38.4–76.8)
Platelets: 317 10*3/uL (ref 145–400)
RBC: 4.8 10*6/uL (ref 3.70–5.45)
RDW: 14 % (ref 11.2–14.5)
WBC: 7.8 10*3/uL (ref 3.9–10.3)
lymph#: 1.9 10*3/uL (ref 0.9–3.3)

## 2015-12-22 LAB — COMPREHENSIVE METABOLIC PANEL
ALT: 30 U/L (ref 0–55)
AST: 35 U/L — ABNORMAL HIGH (ref 5–34)
Albumin: 3.9 g/dL (ref 3.5–5.0)
Alkaline Phosphatase: 86 U/L (ref 40–150)
Anion Gap: 8 mEq/L (ref 3–11)
BUN: 11.6 mg/dL (ref 7.0–26.0)
CO2: 30 mEq/L — ABNORMAL HIGH (ref 22–29)
Calcium: 9.4 mg/dL (ref 8.4–10.4)
Chloride: 102 mEq/L (ref 98–109)
Creatinine: 0.7 mg/dL (ref 0.6–1.1)
EGFR: >90 mL/min/{1.73_m2} (ref >90)
Glucose: 94 mg/dl (ref 70–140)
Potassium: 4.3 mEq/L (ref 3.5–5.1)
Sodium: 141 mEq/L (ref 136–145)
Total Bilirubin: 0.59 mg/dL (ref 0.20–1.20)
Total Protein: 7.6 g/dL (ref 6.4–8.3)

## 2015-12-22 MED ORDER — DOXYCYCLINE HYCLATE 100 MG PO TABS: 100.0000 mg | ORAL_TABLET | Freq: Two times a day (BID) | ORAL | Status: DC

## 2015-12-22 MED ORDER — HYDROCOD POLST-CPM POLST ER 10-8 MG/5ML PO SUER: 5.0000 mL | Freq: Two times a day (BID) | ORAL | Status: DC | PRN

## 2015-12-22 NOTE — Telephone Encounter (Signed)
per pof to sch pt appt-gave pt copy of avs °

## 2015-12-22 NOTE — Progress Notes (Signed)
[facility] [phone]   [phone]  OFFICE PROGRESS NOTE  [name], MD [place] [address]  PRINCIPAL DIAGNOSIS: Local recurrence of non-small cell lung cancer, adenocarcinoma, initially diagnosed as stage IB (T2a N0 M0) in March of 2010.   PRIOR THERAPY:  1. Status post right lower lobectomy under the care of Dr. Cyndia Bent on 04/08/2009. 2. Status post palliative radiotherapy to the right lower lobe recurrent lung mass under the care of Dr. Lisbeth Renshaw, completed on June 08, 2011. 3. Systemic chemotherapy with carboplatin for AUC of 5 and Alimta 500 mg/M2 every 3 weeks. She is status post 3 cycles.  CURRENT THERAPY: Tarceva 150 mg by mouth daily, therapy beginning 07/24/2012. Status post approximately 34 months of therapy.   CHEMOTHERAPY INTENT: Palliative  CURRENT # OF CHEMOTHERAPY CYCLES: 35 CURRENT ANTIEMETICS: Compazine  CURRENT SMOKING STATUS: Nonsmoker  ORAL CHEMOTHERAPY AND CONSENT: Tarceva  CURRENT BISPHOSPHONATES USE: None  PAIN MANAGEMENT: None  NARCOTICS INDUCED CONSTIPATION: None  LIVING WILL AND CODE STATUS: Full code  INTERVAL HISTORY: [name] 55 y.o. female returns to the clinic today for follow up visit accompanied by her son and interpreter. The patient is feeling fine today with no specific complaints except for the persistent dry cough and she has some cold symptoms with runny nose. She is requesting refill of Tussionex and doxycycline. She is currently on treatment with Tarceva 150 mg by mouth daily and is tolerating it fairly well except for occasional diarrhea. She denied having any significant chest pain, shortness of breath,  no hemoptysis. The patient denied having any significant weight loss or night sweats. She denied having any nausea or vomiting , fever or chills. She had repeat CBC and comprehensive metabolic panel performed earlier today and she is here for evaluation and discussion of her  lab results.  MEDICAL HISTORY: Past Medical History  Diagnosis Date  . History of radiation therapy 05/02/11 to 06/08/11    right lung  . Lung cancer ([place])     right lower lobe adenocarcinoma    ALLERGIES:  is allergic to codeine and doxycycline.  MEDICATIONS:  Current Outpatient Prescriptions  Medication Sig Dispense Refill  . albuterol (PROVENTIL HFA;VENTOLIN HFA) 108 (90 BASE) MCG/ACT inhaler Inhale 2 puffs into the lungs every 6 (six) hours as needed for wheezing. 1 Inhaler 0  . chlorpheniramine-HYDROcodone (TUSSIONEX) 10-8 MG/5ML SUER Take 5 mLs by mouth every 12 (twelve) hours as needed for cough. 140 mL 0  . clindamycin (CLEOCIN T) 1 % lotion Apply topically 2 (two) times daily. 60 mL 0  . erlotinib (TARCEVA) 150 MG tablet Take 1 tablet (150 mg total) by mouth daily. 30 tablet 2  . lidocaine-prilocaine (EMLA) cream Apply to Port-A-Cath prior to tx PRN 5 g prn 1 year   No current facility-administered medications for this visit.    SURGICAL HISTORY:  Past Surgical History  Procedure Laterality Date  . Lung lobectomy  04/18/2009    RLL  . Portacath placement  02/29/2012    Procedure: INSERTION PORT-A-CATH;  Surgeon: Nicanor Alcon, MD;  Location: [facility];  Service: Thoracic;  Laterality: Left;  PowerPort 8 F attachable in left internal jugular.    REVIEW OF SYSTEMS:  A comprehensive review of systems was negative except for: Ears, nose, mouth, throat, and face: positive for nasal congestion Respiratory: positive for cough   PHYSICAL EXAMINATION: General appearance: alert, cooperative and no distress Head: Normocephalic, without obvious abnormality, atraumatic  Neck: no adenopathy, no JVD, supple, symmetrical, trachea midline and thyroid not enlarged, symmetric, no tenderness/mass/nodules Lymph nodes: Cervical, supraclavicular, and axillary nodes normal. Resp: wheezes bilaterally Back: symmetric, no curvature. ROM normal. No CVA tenderness. Cardio: regular rate and rhythm, S1,  S2 normal, no murmur, click, rub or gallop GI: soft, non-tender; bowel sounds normal; no masses,  no organomegaly Extremities: extremities normal, atraumatic, no cyanosis or edema Neurologic: Alert and oriented X 3, normal strength and tone. Normal symmetric reflexes. Normal coordination and gait  ECOG PERFORMANCE STATUS: 1 - Symptomatic but completely ambulatory  Blood pressure 102/63, pulse 101, temperature 98.3 F (36.8 C), temperature source Oral, resp. rate 18, height 5' (1.524 m), weight 128 lb 8 oz (58.287 kg), SpO2 99 %.  LABORATORY DATA: Lab Results  Component Value Date   WBC 7.8 12/22/2015   HGB 13.4 12/22/2015   HCT 41.3 12/22/2015   MCV 86.1 12/22/2015   PLT 317 12/22/2015      Chemistry      Component Value Date/Time   NA 141 12/22/2015 1002   NA 140 09/09/2014 0410   NA 141 01/06/2012 0804   K 4.3 12/22/2015 1002   K 3.9 09/09/2014 0410   K 4.6 01/06/2012 0804   CL 102 09/09/2014 0410   CL 102 04/30/2013 1002   CL 98 01/06/2012 0804   CO2 30* 12/22/2015 1002   CO2 28 09/09/2014 0410   CO2 28 01/06/2012 0804   BUN 11.6 12/22/2015 1002   BUN 8 09/09/2014 0410   BUN 10 01/06/2012 0804   CREATININE 0.7 12/22/2015 1002   CREATININE 0.46* 09/09/2014 0410   CREATININE 0.6 01/06/2012 0804      Component Value Date/Time   CALCIUM 9.4 12/22/2015 1002   CALCIUM 8.7 09/09/2014 0410   CALCIUM 8.9 01/06/2012 0804   ALKPHOS 86 12/22/2015 1002   ALKPHOS 93 07/12/2012 1121   ALKPHOS 64 01/06/2012 0804   AST 35* 12/22/2015 1002   AST 44* 07/12/2012 1121   AST 27 01/06/2012 0804   ALT 30 12/22/2015 1002   ALT 49* 07/12/2012 1121   ALT 24 01/06/2012 0804   BILITOT 0.59 12/22/2015 1002   BILITOT 0.3 07/12/2012 1121   BILITOT 0.60 01/06/2012 0804       RADIOGRAPHIC STUDIES: No results found.  ASSESSMENT AND PLAN: This is a very pleasant 55 years old Asian female with recurrent non-small cell lung cancer, adenocarcinoma. She is currently on treatment with oral  Tarceva 150 mg by mouth daily status post 34 months of treatment. She is tolerating her treatment with Tarceva fairly well.  I recommended for her to continue her current treatment with Tarceva. For the chest wheezing, I advised the patient to continue with her inhaler every 6 hours as needed. For the cough, she will continue on Tussionex. I also given her prescription for doxycycline 100 mg by mouth twice a day for 10 days. For the dry skin, the patient was advised to use lotions. She will come back for follow-up visit in 4 weeks for reevaluation with repeat blood work. She was advised to call immediately if she has any concerning symptoms in the interval. The patient voices understanding of current disease status and treatment options and is in agreement with the current care plan.  All questions were answered. The patient knows to call the clinic with any problems, questions or concerns. We can certainly see the patient much sooner if necessary.  Disclaimer: This note was dictated with voice recognition software. Similar sounding  words can inadvertently be transcribed and may not be corrected upon review.

## 2015-12-28 ENCOUNTER — Other Ambulatory Visit: Payer: Self-pay | Admitting: Medical Oncology

## 2015-12-28 DIAGNOSIS — Z95828 Presence of other vascular implants and grafts: Secondary | ICD-10-CM

## 2015-12-28 DIAGNOSIS — C3491 Malignant neoplasm of unspecified part of right bronchus or lung: Secondary | ICD-10-CM

## 2015-12-28 MED ORDER — ERLOTINIB HCL 150 MG PO TABS: 150.0000 mg | ORAL_TABLET | Freq: Every day | ORAL | Status: DC

## 2016-01-19 ENCOUNTER — Ambulatory Visit (HOSPITAL_BASED_OUTPATIENT_CLINIC_OR_DEPARTMENT_OTHER): Payer: Medicare Other

## 2016-01-19 ENCOUNTER — Ambulatory Visit (HOSPITAL_BASED_OUTPATIENT_CLINIC_OR_DEPARTMENT_OTHER): Payer: Medicare Other | Admitting: Internal Medicine

## 2016-01-19 ENCOUNTER — Telehealth: Payer: Self-pay | Admitting: Internal Medicine

## 2016-01-19 ENCOUNTER — Encounter: Payer: Self-pay | Admitting: Internal Medicine

## 2016-01-19 ENCOUNTER — Other Ambulatory Visit (HOSPITAL_BASED_OUTPATIENT_CLINIC_OR_DEPARTMENT_OTHER): Payer: Medicare Other

## 2016-01-19 VITALS — BP 132/83 | HR 99 | Temp 98.8°F | Resp 17 | Ht 60.0 in | Wt 129.0 lb

## 2016-01-19 DIAGNOSIS — Z95828 Presence of other vascular implants and grafts: Secondary | ICD-10-CM

## 2016-01-19 DIAGNOSIS — Z5111 Encounter for antineoplastic chemotherapy: Secondary | ICD-10-CM

## 2016-01-19 DIAGNOSIS — C3431 Malignant neoplasm of lower lobe, right bronchus or lung: Secondary | ICD-10-CM

## 2016-01-19 DIAGNOSIS — R062 Wheezing: Secondary | ICD-10-CM

## 2016-01-19 DIAGNOSIS — R05 Cough: Secondary | ICD-10-CM

## 2016-01-19 DIAGNOSIS — C3491 Malignant neoplasm of unspecified part of right bronchus or lung: Secondary | ICD-10-CM

## 2016-01-19 HISTORY — DX: Encounter for antineoplastic chemotherapy: Z51.11

## 2016-01-19 LAB — COMPREHENSIVE METABOLIC PANEL
ALT: 22 U/L (ref 0–55)
AST: 24 U/L (ref 5–34)
Albumin: 3.7 g/dL (ref 3.5–5.0)
Alkaline Phosphatase: 77 U/L (ref 40–150)
Anion Gap: 8 mEq/L (ref 3–11)
BUN: 12.1 mg/dL (ref 7.0–26.0)
CO2: 29 mEq/L (ref 22–29)
Calcium: 8.9 mg/dL (ref 8.4–10.4)
Chloride: 104 mEq/L (ref 98–109)
Creatinine: 0.6 mg/dL (ref 0.6–1.1)
EGFR: >90 mL/min/{1.73_m2} (ref >90)
Glucose: 95 mg/dl (ref 70–140)
Potassium: 3.7 mEq/L (ref 3.5–5.1)
Sodium: 141 mEq/L (ref 136–145)
Total Bilirubin: 0.67 mg/dL (ref 0.20–1.20)
Total Protein: 7.2 g/dL (ref 6.4–8.3)

## 2016-01-19 LAB — CBC WITH DIFFERENTIAL/PLATELET
BASO%: 0.1 % (ref 0.0–2.0)
Basophils Absolute: 0 10*3/uL (ref 0.0–0.1)
EOS%: 1.1 % (ref 0.0–7.0)
Eosinophils Absolute: 0.1 10*3/uL (ref 0.0–0.5)
HCT: 39.9 % (ref 34.8–46.6)
HGB: 13.2 g/dL (ref 11.6–15.9)
LYMPH%: 24.8 % (ref 14.0–49.7)
MCH: 28.9 pg (ref 25.1–34.0)
MCHC: 33.1 g/dL (ref 31.5–36.0)
MCV: 87.5 fL (ref 79.5–101.0)
MONO#: 0.6 10*3/uL (ref 0.1–0.9)
MONO%: 8 % (ref 0.0–14.0)
NEUT#: 4.7 10*3/uL (ref 1.5–6.5)
NEUT%: 66 % (ref 38.4–76.8)
Platelets: 296 10*3/uL (ref 145–400)
RBC: 4.56 10*6/uL (ref 3.70–5.45)
RDW: 13.5 % (ref 11.2–14.5)
WBC: 7.2 10*3/uL (ref 3.9–10.3)
lymph#: 1.8 10*3/uL (ref 0.9–3.3)

## 2016-01-19 MED ORDER — HEPARIN SOD (PORK) LOCK FLUSH 100 UNIT/ML IV SOLN
500.0000 [IU] | Freq: Once | INTRAVENOUS | Status: AC
  Administered 2016-01-19: 500 [IU] via INTRAVENOUS
  Filled 2016-01-19: qty 5

## 2016-01-19 MED ORDER — SODIUM CHLORIDE 0.9% FLUSH
10.0000 mL | INTRAVENOUS | Status: DC | PRN
  Administered 2016-01-19: 10 mL via INTRAVENOUS
  Filled 2016-01-19: qty 10

## 2016-01-19 NOTE — Progress Notes (Signed)
[facility] [phone]   [phone]  OFFICE PROGRESS NOTE  [name], MD [place] [address]  PRINCIPAL DIAGNOSIS: Local recurrence of non-small cell lung cancer, adenocarcinoma, initially diagnosed as stage IB (T2a N0 M0) in March of 2010.   PRIOR THERAPY:  1. Status post right lower lobectomy under the care of Dr. Cyndia Bent on 04/08/2009. 2. Status post palliative radiotherapy to the right lower lobe recurrent lung mass under the care of Dr. Lisbeth Renshaw, completed on June 08, 2011. 3. Systemic chemotherapy with carboplatin for AUC of 5 and Alimta 500 mg/M2 every 3 weeks. She is status post 3 cycles.  CURRENT THERAPY: Tarceva 150 mg by mouth daily, therapy beginning 07/24/2012. Status post approximately 35 months of therapy.   CHEMOTHERAPY INTENT: Palliative  CURRENT # OF CHEMOTHERAPY CYCLES: 36 CURRENT ANTIEMETICS: Compazine  CURRENT SMOKING STATUS: Nonsmoker  ORAL CHEMOTHERAPY AND CONSENT: Tarceva  CURRENT BISPHOSPHONATES USE: None  PAIN MANAGEMENT: None  NARCOTICS INDUCED CONSTIPATION: None  LIVING WILL AND CODE STATUS: Full code  INTERVAL HISTORY: [name] 55 y.o. female returns to the clinic today for follow up visit accompanied by her interpreter. The patient is feeling fine today with no specific complaints except for the persistent dry cough and she takes Tussionex with improvement of her cough. She is currently on treatment with Tarceva 150 mg by mouth daily and is tolerating it fairly well except for occasional diarrhea every other day, improved with Imodium. She denied having any significant chest pain, shortness of breath,  no hemoptysis. The patient denied having any significant weight loss or night sweats. She denied having any nausea or vomiting , fever or chills. She had repeat CBC and comprehensive metabolic panel performed earlier today and she is here for evaluation and discussion of her lab  results.  MEDICAL HISTORY: Past Medical History  Diagnosis Date  . History of radiation therapy 05/02/11 to 06/08/11    right lung  . Lung cancer ([place])     right lower lobe adenocarcinoma    ALLERGIES:  is allergic to codeine and doxycycline.  MEDICATIONS:  Current Outpatient Prescriptions  Medication Sig Dispense Refill  . albuterol (PROVENTIL HFA;VENTOLIN HFA) 108 (90 BASE) MCG/ACT inhaler Inhale 2 puffs into the lungs every 6 (six) hours as needed for wheezing. 1 Inhaler 0  . chlorpheniramine-HYDROcodone (TUSSIONEX) 10-8 MG/5ML SUER Take 5 mLs by mouth every 12 (twelve) hours as needed for cough. 140 mL 0  . clindamycin (CLEOCIN T) 1 % lotion Apply topically 2 (two) times daily. 60 mL 0  . doxycycline (VIBRA-TABS) 100 MG tablet Take 1 tablet (100 mg total) by mouth 2 (two) times daily. 20 tablet 0  . erlotinib (TARCEVA) 150 MG tablet Take 1 tablet (150 mg total) by mouth daily. 30 tablet 1  . lidocaine-prilocaine (EMLA) cream Apply to Port-A-Cath prior to tx PRN 5 g prn 1 year   No current facility-administered medications for this visit.    SURGICAL HISTORY:  Past Surgical History  Procedure Laterality Date  . Lung lobectomy  04/18/2009    RLL  . Portacath placement  02/29/2012    Procedure: INSERTION PORT-A-CATH;  Surgeon: Nicanor Alcon, MD;  Location: [facility];  Service: Thoracic;  Laterality: Left;  PowerPort 8 F attachable in left internal jugular.    REVIEW OF SYSTEMS:  A comprehensive review of systems was negative except for: Respiratory: positive for cough Gastrointestinal: positive for diarrhea   PHYSICAL EXAMINATION: General  appearance: alert, cooperative and no distress Head: Normocephalic, without obvious abnormality, atraumatic Neck: no adenopathy, no JVD, supple, symmetrical, trachea midline and thyroid not enlarged, symmetric, no tenderness/mass/nodules Lymph nodes: Cervical, supraclavicular, and axillary nodes normal. Resp: wheezes bilaterally Back: symmetric,  no curvature. ROM normal. No CVA tenderness. Cardio: regular rate and rhythm, S1, S2 normal, no murmur, click, rub or gallop GI: soft, non-tender; bowel sounds normal; no masses,  no organomegaly Extremities: extremities normal, atraumatic, no cyanosis or edema Neurologic: Alert and oriented X 3, normal strength and tone. Normal symmetric reflexes. Normal coordination and gait  ECOG PERFORMANCE STATUS: 1 - Symptomatic but completely ambulatory  Blood pressure 132/83, pulse 99, temperature 98.8 F (37.1 C), temperature source Oral, resp. rate 17, height 5' (1.524 m), weight 129 lb (58.514 kg), SpO2 99 %.  LABORATORY DATA: Lab Results  Component Value Date   WBC 7.2 01/19/2016   HGB 13.2 01/19/2016   HCT 39.9 01/19/2016   MCV 87.5 01/19/2016   PLT 296 01/19/2016      Chemistry      Component Value Date/Time   NA 141 01/19/2016 0908   NA 140 09/09/2014 0410   NA 141 01/06/2012 0804   K 3.7 01/19/2016 0908   K 3.9 09/09/2014 0410   K 4.6 01/06/2012 0804   CL 102 09/09/2014 0410   CL 102 04/30/2013 1002   CL 98 01/06/2012 0804   CO2 29 01/19/2016 0908   CO2 28 09/09/2014 0410   CO2 28 01/06/2012 0804   BUN 12.1 01/19/2016 0908   BUN 8 09/09/2014 0410   BUN 10 01/06/2012 0804   CREATININE 0.6 01/19/2016 0908   CREATININE 0.46* 09/09/2014 0410   CREATININE 0.6 01/06/2012 0804      Component Value Date/Time   CALCIUM 8.9 01/19/2016 0908   CALCIUM 8.7 09/09/2014 0410   CALCIUM 8.9 01/06/2012 0804   ALKPHOS 77 01/19/2016 0908   ALKPHOS 93 07/12/2012 1121   ALKPHOS 64 01/06/2012 0804   AST 24 01/19/2016 0908   AST 44* 07/12/2012 1121   AST 27 01/06/2012 0804   ALT 22 01/19/2016 0908   ALT 49* 07/12/2012 1121   ALT 24 01/06/2012 0804   BILITOT 0.67 01/19/2016 0908   BILITOT 0.3 07/12/2012 1121   BILITOT 0.60 01/06/2012 0804       RADIOGRAPHIC STUDIES: No results found.  ASSESSMENT AND PLAN: This is a very pleasant 55 years old Asian female with recurrent  non-small cell lung cancer, adenocarcinoma. She is currently on treatment with oral Tarceva 150 mg by mouth daily status post 35 months of treatment. She is tolerating her treatment with Tarceva fairly well.  I recommended for her to continue her current treatment with Tarceva. For the chest wheezing, I advised the patient to continue with her inhaler every 6 hours as needed. For the cough, she will continue on Tussionex.  For the dry skin, the patient was advised to use lotions. She will come back for follow-up visit in 4 weeks for reevaluation with repeat blood work as well as CT scan of the chest for restaging of her disease. She was advised to call immediately if she has any concerning symptoms in the interval. The patient voices understanding of current disease status and treatment options and is in agreement with the current care plan.  All questions were answered. The patient knows to call the clinic with any problems, questions or concerns. We can certainly see the patient much sooner if necessary.  Disclaimer: This note was  dictated with voice recognition software. Similar sounding words can inadvertently be transcribed and may not be corrected upon review.

## 2016-01-19 NOTE — Patient Instructions (Signed)

## 2016-01-19 NOTE — Telephone Encounter (Signed)
Pt confirmed labs/ov per 02/21 POF, gave pt AVS and Calendar... KJ °

## 2016-01-25 ENCOUNTER — Telehealth: Payer: Self-pay | Admitting: Medical Oncology

## 2016-01-25 NOTE — Telephone Encounter (Signed)
Doxycycline helped the facial rash but not her sore throat. Her throat is sore. Per Julien Nordmann I told rah that pt can take otc cough drop , see PCP for further care.

## 2016-02-15 ENCOUNTER — Encounter (HOSPITAL_COMMUNITY): Payer: Self-pay

## 2016-02-15 ENCOUNTER — Ambulatory Visit (HOSPITAL_BASED_OUTPATIENT_CLINIC_OR_DEPARTMENT_OTHER): Payer: Medicare Other

## 2016-02-15 ENCOUNTER — Other Ambulatory Visit (HOSPITAL_BASED_OUTPATIENT_CLINIC_OR_DEPARTMENT_OTHER): Payer: Medicare Other

## 2016-02-15 ENCOUNTER — Ambulatory Visit (HOSPITAL_COMMUNITY)
Admission: RE | Admit: 2016-02-15 | Discharge: 2016-02-15 | Disposition: A | Discharge status: Home / Self Care | Payer: Medicare Other | Source: Ambulatory Visit | Attending: Internal Medicine | Admitting: Internal Medicine

## 2016-02-15 VITALS — BP 130/83 | HR 93 | Temp 98.2°F | Resp 16

## 2016-02-15 DIAGNOSIS — C3431 Malignant neoplasm of lower lobe, right bronchus or lung: Secondary | ICD-10-CM

## 2016-02-15 DIAGNOSIS — Z5111 Encounter for antineoplastic chemotherapy: Secondary | ICD-10-CM | POA: Insufficient documentation

## 2016-02-15 DIAGNOSIS — J90 Pleural effusion, not elsewhere classified: Secondary | ICD-10-CM | POA: Diagnosis not present

## 2016-02-15 DIAGNOSIS — Z95828 Presence of other vascular implants and grafts: Secondary | ICD-10-CM

## 2016-02-15 LAB — COMPREHENSIVE METABOLIC PANEL
ALT: 26 U/L (ref 0–55)
AST: 34 U/L (ref 5–34)
Albumin: 3.7 g/dL (ref 3.5–5.0)
Alkaline Phosphatase: 91 U/L (ref 40–150)
Anion Gap: 7 mEq/L (ref 3–11)
BUN: 11.4 mg/dL (ref 7.0–26.0)
CO2: 30 mEq/L — ABNORMAL HIGH (ref 22–29)
Calcium: 9.2 mg/dL (ref 8.4–10.4)
Chloride: 103 mEq/L (ref 98–109)
Creatinine: 0.6 mg/dL (ref 0.6–1.1)
EGFR: >90 mL/min/{1.73_m2} (ref >90)
Glucose: 93 mg/dl (ref 70–140)
Potassium: 3.7 mEq/L (ref 3.5–5.1)
Sodium: 140 mEq/L (ref 136–145)
Total Bilirubin: 0.74 mg/dL (ref 0.20–1.20)
Total Protein: 7.2 g/dL (ref 6.4–8.3)

## 2016-02-15 LAB — CBC WITH DIFFERENTIAL/PLATELET
BASO%: 0.1 % (ref 0.0–2.0)
Basophils Absolute: 0 10*3/uL (ref 0.0–0.1)
EOS%: 1.2 % (ref 0.0–7.0)
Eosinophils Absolute: 0.1 10*3/uL (ref 0.0–0.5)
HCT: 38.7 % (ref 34.8–46.6)
HGB: 12.7 g/dL (ref 11.6–15.9)
LYMPH%: 23.8 % (ref 14.0–49.7)
MCH: 28.5 pg (ref 25.1–34.0)
MCHC: 32.8 g/dL (ref 31.5–36.0)
MCV: 87 fL (ref 79.5–101.0)
MONO#: 0.3 10*3/uL (ref 0.1–0.9)
MONO%: 4.8 % (ref 0.0–14.0)
NEUT#: 4.9 10*3/uL (ref 1.5–6.5)
NEUT%: 70.1 % (ref 38.4–76.8)
Platelets: 310 10*3/uL (ref 145–400)
RBC: 4.45 10*6/uL (ref 3.70–5.45)
RDW: 13.8 % (ref 11.2–14.5)
WBC: 6.9 10*3/uL (ref 3.9–10.3)
lymph#: 1.7 10*3/uL (ref 0.9–3.3)

## 2016-02-15 MED ORDER — IOHEXOL 300 MG/ML  SOLN
75.0000 mL | Freq: Once | INTRAMUSCULAR | Status: AC | PRN
  Administered 2016-02-15: 75 mL via INTRAVENOUS

## 2016-02-15 MED ORDER — HEPARIN SOD (PORK) LOCK FLUSH 100 UNIT/ML IV SOLN
500.0000 [IU] | Freq: Once | INTRAVENOUS | Status: AC
  Administered 2016-02-15: 500 [IU] via INTRAVENOUS
  Filled 2016-02-15: qty 5

## 2016-02-15 MED ORDER — SODIUM CHLORIDE 0.9% FLUSH
10.0000 mL | INTRAVENOUS | Status: DC | PRN
  Administered 2016-02-15: 10 mL via INTRAVENOUS
  Filled 2016-02-15: qty 10

## 2016-02-15 NOTE — Patient Instructions (Signed)

## 2016-02-22 ENCOUNTER — Telehealth: Payer: Self-pay | Admitting: Internal Medicine

## 2016-02-22 ENCOUNTER — Ambulatory Visit (HOSPITAL_BASED_OUTPATIENT_CLINIC_OR_DEPARTMENT_OTHER): Payer: Medicare Other | Admitting: Internal Medicine

## 2016-02-22 ENCOUNTER — Encounter: Payer: Self-pay | Admitting: Internal Medicine

## 2016-02-22 VITALS — BP 129/89 | HR 97 | Temp 98.3°F | Resp 18 | Ht 60.0 in | Wt 128.7 lb

## 2016-02-22 DIAGNOSIS — C3431 Malignant neoplasm of lower lobe, right bronchus or lung: Secondary | ICD-10-CM | POA: Diagnosis not present

## 2016-02-22 DIAGNOSIS — C3491 Malignant neoplasm of unspecified part of right bronchus or lung: Secondary | ICD-10-CM

## 2016-02-22 DIAGNOSIS — Z95828 Presence of other vascular implants and grafts: Secondary | ICD-10-CM

## 2016-02-22 MED ORDER — HYDROCOD POLST-CPM POLST ER 10-8 MG/5ML PO SUER: 5.0000 mL | Freq: Two times a day (BID) | ORAL | Status: DC | PRN

## 2016-02-22 MED ORDER — DOXYCYCLINE HYCLATE 100 MG PO TABS: 100.0000 mg | ORAL_TABLET | Freq: Two times a day (BID) | ORAL | Status: DC

## 2016-02-22 NOTE — Progress Notes (Signed)
[facility] [phone]   [phone]  OFFICE PROGRESS NOTE  [name], MD [place] [id number]  PRINCIPAL DIAGNOSIS: Local recurrence of non-small cell lung cancer, adenocarcinoma, initially diagnosed as stage IB (T2a N0 M0) in March of 2010.   PRIOR THERAPY:  1. Status post right lower lobectomy under the care of Dr. Cyndia Bent on 04/08/2009. 2. Status post palliative radiotherapy to the right lower lobe recurrent lung mass under the care of Dr. Lisbeth Renshaw, completed on June 08, 2011. 3. Systemic chemotherapy with carboplatin for AUC of 5 and Alimta 500 mg/M2 every 3 weeks. She is status post 3 cycles.  CURRENT THERAPY: Tarceva 150 mg by mouth daily, therapy beginning 07/24/2012. Status post approximately 36 months of therapy.   CHEMOTHERAPY INTENT: Palliative  CURRENT # OF CHEMOTHERAPY CYCLES: 37 CURRENT ANTIEMETICS: Compazine  CURRENT SMOKING STATUS: Nonsmoker  ORAL CHEMOTHERAPY AND CONSENT: Tarceva  CURRENT BISPHOSPHONATES USE: None  PAIN MANAGEMENT: None  NARCOTICS INDUCED CONSTIPATION: None  LIVING WILL AND CODE STATUS: Full code  INTERVAL HISTORY: [name] 55 y.o. female returns to the clinic today for follow up visit accompanied by her interpreter. The patient is feeling fine today with no specific complaints except for the persistent dry cough and she is currently on Tussionex with improvement of her cough. She is requesting refill of this medication. She is currently on treatment with Tarceva 150 mg by mouth daily and is tolerating it fairly well except for occasional diarrhea every other day, improved with Imodium. She denied having any significant chest pain, shortness of breath,  no hemoptysis. The patient denied having any significant weight loss or night sweats. She denied having any nausea or vomiting , fever or chills. She has mild back pain between the shoulder blades relieved with Tylenol. She also has  inflammation in the right big toe. She had repeat CBC and comprehensive metabolic panel as well as CT scan of the chest performed recently and she is here for evaluation and discussion of her lab and scan results.  MEDICAL HISTORY: Past Medical History  Diagnosis Date  . History of radiation therapy 05/02/11 to 06/08/11    right lung  . Lung cancer ([place])     right lower lobe adenocarcinoma  . Encounter for antineoplastic chemotherapy 01/19/2016    ALLERGIES:  is allergic to codeine and doxycycline.  MEDICATIONS:  Current Outpatient Prescriptions  Medication Sig Dispense Refill  . albuterol (PROVENTIL HFA;VENTOLIN HFA) 108 (90 BASE) MCG/ACT inhaler Inhale 2 puffs into the lungs every 6 (six) hours as needed for wheezing. 1 Inhaler 0  . chlorpheniramine-HYDROcodone (TUSSIONEX) 10-8 MG/5ML SUER Take 5 mLs by mouth every 12 (twelve) hours as needed for cough. 140 mL 0  . clindamycin (CLEOCIN T) 1 % lotion Apply topically 2 (two) times daily. 60 mL 0  . doxycycline (VIBRA-TABS) 100 MG tablet Take 1 tablet (100 mg total) by mouth 2 (two) times daily. 20 tablet 0  . erlotinib (TARCEVA) 150 MG tablet Take 1 tablet (150 mg total) by mouth daily. 30 tablet 1  . lidocaine-prilocaine (EMLA) cream Apply to Port-A-Cath prior to tx PRN 5 g prn 1 year   No current facility-administered medications for this visit.    SURGICAL HISTORY:  Past Surgical History  Procedure Laterality Date  . Lung lobectomy  04/18/2009    RLL  . Portacath placement  02/29/2012    Procedure: INSERTION PORT-A-CATH;  Surgeon: Nicanor Alcon, MD;  Location:  [facility] OR;  Service: Thoracic;  Laterality: Left;  PowerPort 8 F attachable in left internal jugular.    REVIEW OF SYSTEMS:  Constitutional: positive for fatigue Eyes: negative Ears, nose, mouth, throat, and face: negative Respiratory: positive for cough and dyspnea on exertion Cardiovascular: negative Gastrointestinal: negative Genitourinary:negative Integument/breast:  negative Hematologic/lymphatic: negative Musculoskeletal:negative Neurological: negative Behavioral/Psych: negative Endocrine: negative Allergic/Immunologic: negative   PHYSICAL EXAMINATION: General appearance: alert, cooperative and no distress Head: Normocephalic, without obvious abnormality, atraumatic Neck: no adenopathy, no JVD, supple, symmetrical, trachea midline and thyroid not enlarged, symmetric, no tenderness/mass/nodules Lymph nodes: Cervical, supraclavicular, and axillary nodes normal. Resp: wheezes bilaterally Back: symmetric, no curvature. ROM normal. No CVA tenderness. Cardio: regular rate and rhythm, S1, S2 normal, no murmur, click, rub or gallop GI: soft, non-tender; bowel sounds normal; no masses,  no organomegaly Extremities: extremities normal, atraumatic, no cyanosis or edema Neurologic: Alert and oriented X 3, normal strength and tone. Normal symmetric reflexes. Normal coordination and gait  ECOG PERFORMANCE STATUS: 1 - Symptomatic but completely ambulatory  Blood pressure 129/89, pulse 97, temperature 98.3 F (36.8 C), temperature source Oral, resp. rate 18, height 5' (1.524 m), weight 128 lb 11.2 oz (58.378 kg), last menstrual period 02/10/2012, SpO2 98 %.  LABORATORY DATA: Lab Results  Component Value Date   WBC 6.9 02/15/2016   HGB 12.7 02/15/2016   HCT 38.7 02/15/2016   MCV 87.0 02/15/2016   PLT 310 02/15/2016      Chemistry      Component Value Date/Time   NA 140 02/15/2016 0955   NA 140 09/09/2014 0410   NA 141 01/06/2012 0804   K 3.7 02/15/2016 0955   K 3.9 09/09/2014 0410   K 4.6 01/06/2012 0804   CL 102 09/09/2014 0410   CL 102 04/30/2013 1002   CL 98 01/06/2012 0804   CO2 30* 02/15/2016 0955   CO2 28 09/09/2014 0410   CO2 28 01/06/2012 0804   BUN 11.4 02/15/2016 0955   BUN 8 09/09/2014 0410   BUN 10 01/06/2012 0804   CREATININE 0.6 02/15/2016 0955   CREATININE 0.46* 09/09/2014 0410   CREATININE 0.6 01/06/2012 0804        Component Value Date/Time   CALCIUM 9.2 02/15/2016 0955   CALCIUM 8.7 09/09/2014 0410   CALCIUM 8.9 01/06/2012 0804   ALKPHOS 91 02/15/2016 0955   ALKPHOS 93 07/12/2012 1121   ALKPHOS 64 01/06/2012 0804   AST 34 02/15/2016 0955   AST 44* 07/12/2012 1121   AST 27 01/06/2012 0804   ALT 26 02/15/2016 0955   ALT 49* 07/12/2012 1121   ALT 24 01/06/2012 0804   BILITOT 0.74 02/15/2016 0955   BILITOT 0.3 07/12/2012 1121   BILITOT 0.60 01/06/2012 0804       RADIOGRAPHIC STUDIES: Ct Chest W Contrast  02/15/2016  CLINICAL DATA:  Status post right lung surgery for right lung cancer initially diagnosed in 2010. Status post radiation therapy. Ongoing Oral chemotherapy. EXAM: CT CHEST WITH CONTRAST TECHNIQUE: Multidetector CT imaging of the chest was performed during intravenous contrast administration. CONTRAST:  32m OMNIPAQUE IOHEXOL 300 MG/ML  SOLN COMPARISON:  11/16/2015 FINDINGS: Mediastinum/Nodes: No breast masses, supraclavicular or axillary lymphadenopathy. Small scattered lymph nodes are stable. Stable left-sided Port-A-Cath. The thyroid gland appears normal. The heart is normal in size and stable. No pericardial effusion. The aorta is normal in caliber. No dissection. Slightly prominent pulmonary arteries. Stable small scattered mediastinal and hilar lymph nodes. 7.5 mm precarinal lymph node on image number 16 is  stable. New line the esophagus is grossly normal. The Lungs/Pleura: Stable surgical and changes involving the right paramediastinal lung and right hilum. No obvious recurrent tumor. There is also a stable right-sided pleural effusion. Surgical changes in the right lung apex posteriorly with stable surrounding soft tissue density. The left lung remains clear. No worrisome pulmonary nodules or acute pulmonary findings. Enlarging semi-solid nodular density in the right lower lobe on image number 26. This measures 14 x 13 mm and previously measured 12.5 x 10 mm. Upper abdomen: No significant  upper abdominal findings. No evidence of metastatic disease involving the liver or adrenal glands. Diffuse fatty infiltration of the liver is noted. Musculoskeletal: No significant osseous findings. IMPRESSION: 1. Stable surgical and radiation changes involving the right lung as described above. 2. Stable right-sided pleural effusion. 3. Slight interval enlargement of the semi-solid nodular density in the right lower lobe measuring a maximum of 14 x 13 mm. Upper PET-CT may be helpful for further evaluation. 4. No new pulmonary lesions. 5. No findings for upper abdominal metastatic disease. Electronically Signed   By: Marijo Sanes M.D.   On: 02/15/2016 09:45    ASSESSMENT AND PLAN: This is a very pleasant 55 years old Asian female with recurrent non-small cell lung cancer, adenocarcinoma. She is currently on treatment with oral Tarceva 150 mg by mouth daily status post 36 months of treatment. She is tolerating her treatment with Tarceva fairly well.  The recent CT scan of the chest showed no significant evidence for disease progression except for slight enlargement of the semisolid nodular density in the right lower lobe. I discussed the scan results and showed the images to the patient.  I recommended for her to continue her current treatment with Tarceva but I will also refer her to Dr. Lisbeth Renshaw for evaluation and consideration of palliative radiotherapy to the enlarging right lower lobe nodule. For the chest wheezing, I advised the patient to continue with her inhaler every 6 hours as needed. For the cough, she will continue on Tussionex.  For the dry skin, the patient was advised to use lotions. For the perineum care of the right big toe, I started the patient on doxycycline 100 mg by mouth twice a day. She will come back for follow-up visit in 4 weeks for reevaluation with repeat blood work. She was advised to call immediately if she has any concerning symptoms in the interval. The patient voices  understanding of current disease status and treatment options and is in agreement with the current care plan.  All questions were answered. The patient knows to call the clinic with any problems, questions or concerns. We can certainly see the patient much sooner if necessary.  Disclaimer: This note was dictated with voice recognition software. Similar sounding words can inadvertently be transcribed and may not be corrected upon review.

## 2016-02-22 NOTE — Telephone Encounter (Signed)
per pof to sch pt appt-gave pt copy of avs °

## 2016-02-25 ENCOUNTER — Other Ambulatory Visit: Payer: Self-pay | Admitting: Medical Oncology

## 2016-02-25 DIAGNOSIS — Z95828 Presence of other vascular implants and grafts: Secondary | ICD-10-CM

## 2016-02-25 DIAGNOSIS — C3491 Malignant neoplasm of unspecified part of right bronchus or lung: Secondary | ICD-10-CM

## 2016-02-25 MED ORDER — ERLOTINIB HCL 150 MG PO TABS: 150.0000 mg | ORAL_TABLET | Freq: Every day | ORAL | Status: DC

## 2016-02-26 NOTE — Progress Notes (Signed)
Follow Up Recon Lung = Recurrent non-small cell carcinoma,/Adenocarcinoma  Radiation  Palliative to right lower lobe lung 05/02/11-06/08/11 with Dr. Lisbeth Renshaw  Dr. Julien Nordmann last seen 02/22/16  Patient's current therapy : Tarceve  '150mg'$  by mouth daily,beginning 07/24/12 s/p 36 months of therapy referral back top Dr. Lisbeth Renshaw for palliative radiation to the enlarging right lower lobe lung nodule, f/u 4 weeks   Interpreter Beatrix Shipper with patient who is [place], no pain, has dry cough,  No nausea, appetiote good,  At times difficulty  swallowinggfoods, has to have water to help get this down, regular diet, tired at times 2:45 PM Vitals,  117/80,P=100,RR=20,T=98.2, Oxygen sats=100% room air Wt Readings from Last 3 Encounters:  02/29/16 130 lb 9.6 oz (59.24 kg)  02/22/16 128 lb 11.2 oz (58.378 kg)  01/19/16 129 lb (58.514 kg)

## 2016-02-29 ENCOUNTER — Encounter: Payer: Self-pay | Admitting: Radiation Oncology

## 2016-02-29 ENCOUNTER — Ambulatory Visit
Admission: RE | Admit: 2016-02-29 | Discharge: 2016-02-29 | Disposition: A | Discharge status: Home / Self Care | Payer: Medicare Other | Source: Ambulatory Visit | Attending: Radiation Oncology | Admitting: Radiation Oncology

## 2016-02-29 VITALS — BP 117/80 | HR 100 | Temp 98.2°F | Resp 16 | Ht 60.0 in | Wt 130.6 lb

## 2016-02-29 DIAGNOSIS — C3431 Malignant neoplasm of lower lobe, right bronchus or lung: Secondary | ICD-10-CM

## 2016-02-29 DIAGNOSIS — Z510 Encounter for antineoplastic radiation therapy: Secondary | ICD-10-CM | POA: Insufficient documentation

## 2016-02-29 NOTE — Progress Notes (Signed)
Radiation Oncology         [phone] ________________________________  Name: [name] MRN: [id number]  Date: 02/29/2016  DOB: Aug 25, 1961  Re-Consultation Visit Note  CC: [name], MD  Curt Bears, MD  Diagnosis: Primary cancer of right lower lobe of lung [facility])   Staging form: Lung, AJCC 7th Edition     Clinical: Recurrent NSCLC, Adenocarcinoma - Signed by Curt Bears, MD on 02/23/2012  Interval Since Last Radiation: 4 years and 9 months  05/02/11 - 06/08/11: Palliative radiation to the right lower lobe.  Narrative:  The patient returns today for routine follow-up. The patient is being followed by Dr. Julien Nordmann with her current therapy consisting of Tarceva 150 mg by mouth daily since 07/24/12. Recent CT scan of the chest, on 02/15/16, showed no significant evidence for disease progression, except for slight enlargement of the semisolid nodular density in the right lower lobe measuring a maximum of 1.4 x 1.3 cm. Dr. Julien Nordmann discussed the scan results and showed the images to the patient. He has recommended for her to continue her current treatment with Tarceva, but has referred the patient back to me for evaluation and consideration of palliative radiotherapy to the enlarging right lower lobe nodule. A translator was present during this encounter.  Review of Symptoms: She reports some wheezing when she breathes or coughs.  ALLERGIES:  is allergic to codeine and doxycycline.  Meds: Current Outpatient Prescriptions  Medication Sig Dispense Refill  . albuterol (PROVENTIL HFA;VENTOLIN HFA) 108 (90 BASE) MCG/ACT inhaler Inhale 2 puffs into the lungs every 6 (six) hours as needed for wheezing. 1 Inhaler 0  . chlorpheniramine-HYDROcodone (TUSSIONEX) 10-8 MG/5ML SUER Take 5 mLs by mouth every 12 (twelve) hours as needed for cough. 140 mL 0  . clindamycin (CLEOCIN T) 1 % lotion Apply topically 2 (two) times daily. 60 mL 0  . doxycycline (VIBRA-TABS) 100 MG tablet Take 1 tablet (100 mg  total) by mouth 2 (two) times daily. 20 tablet 0  . erlotinib (TARCEVA) 150 MG tablet Take 1 tablet (150 mg total) by mouth daily. 30 tablet 0  . lidocaine-prilocaine (EMLA) cream Apply to Port-A-Cath prior to tx PRN 5 g prn 1 year   No current facility-administered medications for this encounter.    Physical Findings: The patient is in no acute distress. Patient is alert and oriented.  vitals were not taken for this visit. Heart has regular rate and rhythm. Some wheezing on the right lung, diffusely clear on the left.  Lab Findings: Lab Results  Component Value Date   WBC 6.9 02/15/2016   HGB 12.7 02/15/2016   HCT 38.7 02/15/2016   MCV 87.0 02/15/2016   PLT 310 02/15/2016     Radiographic Findings: Ct Chest W Contrast  02/15/2016  CLINICAL DATA:  Status post right lung surgery for right lung cancer initially diagnosed in 2010. Status post radiation therapy. Ongoing Oral chemotherapy. EXAM: CT CHEST WITH CONTRAST TECHNIQUE: Multidetector CT imaging of the chest was performed during intravenous contrast administration. CONTRAST:  29m OMNIPAQUE IOHEXOL 300 MG/ML  SOLN COMPARISON:  11/16/2015 FINDINGS: Mediastinum/Nodes: No breast masses, supraclavicular or axillary lymphadenopathy. Small scattered lymph nodes are stable. Stable left-sided Port-A-Cath. The thyroid gland appears normal. The heart is normal in size and stable. No pericardial effusion. The aorta is normal in caliber. No dissection. Slightly prominent pulmonary arteries. Stable small scattered mediastinal and hilar lymph nodes. 7.5 mm precarinal lymph node on image number 16 is stable. New line the esophagus is grossly normal. The  Lungs/Pleura: Stable surgical and changes involving the right paramediastinal lung and right hilum. No obvious recurrent tumor. There is also a stable right-sided pleural effusion. Surgical changes in the right lung apex posteriorly with stable surrounding soft tissue density. The left lung remains  clear. No worrisome pulmonary nodules or acute pulmonary findings. Enlarging semi-solid nodular density in the right lower lobe on image number 26. This measures 14 x 13 mm and previously measured 12.5 x 10 mm. Upper abdomen: No significant upper abdominal findings. No evidence of metastatic disease involving the liver or adrenal glands. Diffuse fatty infiltration of the liver is noted. Musculoskeletal: No significant osseous findings. IMPRESSION: 1. Stable surgical and radiation changes involving the right lung as described above. 2. Stable right-sided pleural effusion. 3. Slight interval enlargement of the semi-solid nodular density in the right lower lobe measuring a maximum of 14 x 13 mm. Upper PET-CT may be helpful for further evaluation. 4. No new pulmonary lesions. 5. No findings for upper abdominal metastatic disease. Electronically Signed   By: Marijo Sanes M.D.   On: 02/15/2016 09:45    Impression: Recurrent NSCLC. The patient is a good candidate for a one week course of palliative SBRT to the right lower lobe given her lack of other significant diseast. I explained the process of CT simulation and the placement of tattoos. I explained the process of treatment and side effects that may arise.  Plan: CT simulation would be scheduled in the near future with treatment afterwards. I would anticipate treating the right lower lobe lesion measuring 1.4 cm with a 3 fraction course of stereotactic body radiation treatment. I believe that this can be completed when taking into consideration the patient's prior treatment. We will review these records carefully during the treatment planning process. We will plan to proceed with a simulation in 2 weeks given her schedule.  Jodelle Gross, M.D., Ph.D.  This document serves as a record of services personally performed by Kyung Rudd, MD. It was created on his behalf by Darcus Austin, a trained medical scribe. The creation of this record is based on the scribe's  personal observations and the provider's statements to them. This document has been checked and approved by the attending provider.

## 2016-03-08 ENCOUNTER — Telehealth: Payer: Self-pay | Admitting: *Deleted

## 2016-03-08 DIAGNOSIS — Z95828 Presence of other vascular implants and grafts: Secondary | ICD-10-CM

## 2016-03-08 DIAGNOSIS — C3491 Malignant neoplasm of unspecified part of right bronchus or lung: Secondary | ICD-10-CM

## 2016-03-08 MED ORDER — DOXYCYCLINE HYCLATE 100 MG PO TABS: 100.0000 mg | ORAL_TABLET | Freq: Two times a day (BID) | ORAL | Status: DC

## 2016-03-08 NOTE — Telephone Encounter (Signed)
TC from pt's son stating that he went to pick up prescription for antibiotic for his mother @ [facility] [address] but it was not there.  Upon review-this prescription was called in to [facility].  Son prefers to have this @ [facility].  Cancelled prescription @ [place] and escribed to [facility].

## 2016-03-14 ENCOUNTER — Ambulatory Visit
Admission: RE | Admit: 2016-03-14 | Discharge: 2016-03-14 | Disposition: A | Discharge status: Home / Self Care | Payer: Medicare Other | Source: Ambulatory Visit | Attending: Radiation Oncology | Admitting: Radiation Oncology

## 2016-03-14 DIAGNOSIS — C3431 Malignant neoplasm of lower lobe, right bronchus or lung: Secondary | ICD-10-CM | POA: Diagnosis not present

## 2016-03-14 DIAGNOSIS — Z510 Encounter for antineoplastic radiation therapy: Secondary | ICD-10-CM | POA: Diagnosis not present

## 2016-03-18 DIAGNOSIS — C3431 Malignant neoplasm of lower lobe, right bronchus or lung: Secondary | ICD-10-CM | POA: Diagnosis not present

## 2016-03-18 DIAGNOSIS — Z510 Encounter for antineoplastic radiation therapy: Secondary | ICD-10-CM | POA: Diagnosis not present

## 2016-03-21 ENCOUNTER — Ambulatory Visit (HOSPITAL_BASED_OUTPATIENT_CLINIC_OR_DEPARTMENT_OTHER): Payer: Medicare Other | Admitting: Internal Medicine

## 2016-03-21 ENCOUNTER — Encounter: Payer: Self-pay | Admitting: Internal Medicine

## 2016-03-21 ENCOUNTER — Telehealth: Payer: Self-pay | Admitting: Internal Medicine

## 2016-03-21 ENCOUNTER — Other Ambulatory Visit (HOSPITAL_BASED_OUTPATIENT_CLINIC_OR_DEPARTMENT_OTHER): Payer: Medicare Other

## 2016-03-21 VITALS — BP 123/78 | HR 102 | Temp 98.3°F | Resp 18 | Ht 60.0 in | Wt 127.3 lb

## 2016-03-21 DIAGNOSIS — C3431 Malignant neoplasm of lower lobe, right bronchus or lung: Secondary | ICD-10-CM | POA: Diagnosis not present

## 2016-03-21 DIAGNOSIS — C3491 Malignant neoplasm of unspecified part of right bronchus or lung: Secondary | ICD-10-CM

## 2016-03-21 DIAGNOSIS — Z95828 Presence of other vascular implants and grafts: Secondary | ICD-10-CM

## 2016-03-21 DIAGNOSIS — R05 Cough: Secondary | ICD-10-CM

## 2016-03-21 LAB — COMPREHENSIVE METABOLIC PANEL
ALT: 15 U/L (ref 0–55)
AST: 19 U/L (ref 5–34)
Albumin: 3.7 g/dL (ref 3.5–5.0)
Alkaline Phosphatase: 78 U/L (ref 40–150)
Anion Gap: 9 mEq/L (ref 3–11)
BUN: 11.4 mg/dL (ref 7.0–26.0)
CO2: 28 mEq/L (ref 22–29)
Calcium: 9.4 mg/dL (ref 8.4–10.4)
Chloride: 104 mEq/L (ref 98–109)
Creatinine: 0.7 mg/dL (ref 0.6–1.1)
EGFR: >90 mL/min/{1.73_m2} (ref >90)
Glucose: 97 mg/dl (ref 70–140)
Potassium: 4.6 mEq/L (ref 3.5–5.1)
Sodium: 141 mEq/L (ref 136–145)
Total Bilirubin: 0.69 mg/dL (ref 0.20–1.20)
Total Protein: 7.5 g/dL (ref 6.4–8.3)

## 2016-03-21 LAB — CBC WITH DIFFERENTIAL/PLATELET
BASO%: 0.4 % (ref 0.0–2.0)
Basophils Absolute: 0 10*3/uL (ref 0.0–0.1)
EOS%: 1.5 % (ref 0.0–7.0)
Eosinophils Absolute: 0.1 10*3/uL (ref 0.0–0.5)
HCT: 43.3 % (ref 34.8–46.6)
HGB: 14.1 g/dL (ref 11.6–15.9)
LYMPH%: 23.9 % (ref 14.0–49.7)
MCH: 28.1 pg (ref 25.1–34.0)
MCHC: 32.5 g/dL (ref 31.5–36.0)
MCV: 86.5 fL (ref 79.5–101.0)
MONO#: 0.6 10*3/uL (ref 0.1–0.9)
MONO%: 7.9 % (ref 0.0–14.0)
NEUT#: 5.3 10*3/uL (ref 1.5–6.5)
NEUT%: 66.3 % (ref 38.4–76.8)
Platelets: 326 10*3/uL (ref 145–400)
RBC: 5.01 10*6/uL (ref 3.70–5.45)
RDW: 13.7 % (ref 11.2–14.5)
WBC: 8 10*3/uL (ref 3.9–10.3)
lymph#: 1.9 10*3/uL (ref 0.9–3.3)

## 2016-03-21 MED ORDER — DOXYCYCLINE HYCLATE 100 MG PO TABS: 100.0000 mg | ORAL_TABLET | Freq: Two times a day (BID) | ORAL | Status: DC

## 2016-03-21 MED ORDER — PROCHLORPERAZINE MALEATE 10 MG PO TABS: 10.0000 mg | ORAL_TABLET | Freq: Four times a day (QID) | ORAL | Status: DC | PRN

## 2016-03-21 NOTE — Progress Notes (Signed)
[facility] Telephone:(336) [phone]   Fax:([phone]  OFFICE PROGRESS NOTE  [name]., MD [place] [address]  PRINCIPAL DIAGNOSIS: Local recurrence of non-small cell lung cancer, adenocarcinoma, initially diagnosed as stage IB (T2a N0 M0) in March of 2010.   PRIOR THERAPY:  1. Status post right lower lobectomy under the care of Dr. Cyndia Bent on 04/08/2009. 2. Status post palliative radiotherapy to the right lower lobe recurrent lung mass under the care of Dr. Lisbeth Renshaw, completed on June 08, 2011. 3. Systemic chemotherapy with carboplatin for AUC of 5 and Alimta 500 mg/M2 every 3 weeks. She is status post 3 cycles.  CURRENT THERAPY: Tarceva 150 mg by mouth daily, therapy beginning 07/24/2012. Status post approximately 37 months of therapy.   CHEMOTHERAPY INTENT: Palliative  CURRENT # OF CHEMOTHERAPY CYCLES: 38 CURRENT ANTIEMETICS: Compazine  CURRENT SMOKING STATUS: Nonsmoker  ORAL CHEMOTHERAPY AND CONSENT: Tarceva  CURRENT BISPHOSPHONATES USE: None  PAIN MANAGEMENT: None  NARCOTICS INDUCED CONSTIPATION: None  LIVING WILL AND CODE STATUS: Full code  INTERVAL HISTORY: [name] 55 y.o. female returns to the clinic today for follow up visit accompanied by her interpreter. The patient is feeling fine today with no specific complaints except for the persistent dry cough and she is currently on Tussionex with improvement of her cough. She also continues to have wheezes but she does not use her inhaler as prescribed. She had paronychia of the Right Big Toe which is better after 8 treatment with doxycycline and she is requesting refill of this medication. She has occasional episodes of diarrhea. She is tolerating her treatment with Tarceva fairly well. She has repeat bloodwork performed earlier today and she is here for evaluation and discussion of her lab results.  MEDICAL HISTORY: Past Medical History  Diagnosis Date  . History of radiation  therapy 05/02/11 to 06/08/11    right lung  . Lung cancer ([facility])     right lower lobe adenocarcinoma  . Encounter for antineoplastic chemotherapy 01/19/2016    ALLERGIES:  is allergic to codeine and doxycycline.  MEDICATIONS:  Current Outpatient Prescriptions  Medication Sig Dispense Refill  . albuterol (PROVENTIL HFA;VENTOLIN HFA) 108 (90 BASE) MCG/ACT inhaler Inhale 2 puffs into the lungs every 6 (six) hours as needed for wheezing. 1 Inhaler 0  . chlorpheniramine-HYDROcodone (TUSSIONEX) 10-8 MG/5ML SUER Take 5 mLs by mouth every 12 (twelve) hours as needed for cough. 140 mL 0  . clindamycin (CLEOCIN T) 1 % lotion Apply topically 2 (two) times daily. 60 mL 0  . doxycycline (VIBRA-TABS) 100 MG tablet Take 1 tablet (100 mg total) by mouth 2 (two) times daily. 20 tablet 0  . erlotinib (TARCEVA) 150 MG tablet Take 1 tablet (150 mg total) by mouth daily. 30 tablet 0  . lidocaine-prilocaine (EMLA) cream Apply to Port-A-Cath prior to tx PRN 5 g prn 1 year   No current facility-administered medications for this visit.    SURGICAL HISTORY:  Past Surgical History  Procedure Laterality Date  . Lung lobectomy  04/18/2009    RLL  . Portacath placement  02/29/2012    Procedure: INSERTION PORT-A-CATH;  Surgeon: Nicanor Alcon, MD;  Location: [facility];  Service: Thoracic;  Laterality: Left;  PowerPort 8 F attachable in left internal jugular.    REVIEW OF SYSTEMS:  A comprehensive review of systems was negative except for: Constitutional: positive for fatigue Gastrointestinal: positive for diarrhea and nausea   PHYSICAL EXAMINATION: General appearance: alert,  cooperative and no distress [name]: Normocephalic, without obvious abnormality, atraumatic Neck: no adenopathy, no JVD, supple, symmetrical, trachea midline and thyroid not enlarged, symmetric, no tenderness/mass/nodules Lymph nodes: Cervical, supraclavicular, and axillary nodes normal. Resp: wheezes bilaterally Back: symmetric, no curvature. ROM  normal. No CVA tenderness. Cardio: regular rate and rhythm, S1, S2 normal, no murmur, click, rub or gallop GI: soft, non-tender; bowel sounds normal; no masses,  no organomegaly Extremities: extremities normal, atraumatic, no cyanosis or edema Neurologic: Alert and oriented X 3, normal strength and tone. Normal symmetric reflexes. Normal coordination and gait  ECOG PERFORMANCE STATUS: 1 - Symptomatic but completely ambulatory  Blood pressure 123/78, pulse 102, temperature 98.3 F (36.8 C), temperature source Oral, resp. rate 18, height 5' (1.524 m), weight 127 lb 4.8 oz (57.743 kg), last menstrual period 02/10/2012, SpO2 100 %.  LABORATORY DATA: Lab Results  Component Value Date   WBC 8.0 03/21/2016   HGB 14.1 03/21/2016   HCT 43.3 03/21/2016   MCV 86.5 03/21/2016   PLT 326 03/21/2016      Chemistry      Component Value Date/Time   NA 140 02/15/2016 0955   NA 140 09/09/2014 0410   NA 141 01/06/2012 0804   K 3.7 02/15/2016 0955   K 3.9 09/09/2014 0410   K 4.6 01/06/2012 0804   CL 102 09/09/2014 0410   CL 102 04/30/2013 1002   CL 98 01/06/2012 0804   CO2 30* 02/15/2016 0955   CO2 28 09/09/2014 0410   CO2 28 01/06/2012 0804   BUN 11.4 02/15/2016 0955   BUN 8 09/09/2014 0410   BUN 10 01/06/2012 0804   CREATININE 0.6 02/15/2016 0955   CREATININE 0.46* 09/09/2014 0410   CREATININE 0.6 01/06/2012 0804      Component Value Date/Time   CALCIUM 9.2 02/15/2016 0955   CALCIUM 8.7 09/09/2014 0410   CALCIUM 8.9 01/06/2012 0804   ALKPHOS 91 02/15/2016 0955   ALKPHOS 93 07/12/2012 1121   ALKPHOS 64 01/06/2012 0804   AST 34 02/15/2016 0955   AST 44* 07/12/2012 1121   AST 27 01/06/2012 0804   ALT 26 02/15/2016 0955   ALT 49* 07/12/2012 1121   ALT 24 01/06/2012 0804   BILITOT 0.74 02/15/2016 0955   BILITOT 0.3 07/12/2012 1121   BILITOT 0.60 01/06/2012 0804       RADIOGRAPHIC STUDIES: No results found.  ASSESSMENT AND PLAN: This is a very pleasant 55 years old Asian  female with recurrent non-small cell lung cancer, adenocarcinoma. She is currently on treatment with oral Tarceva 150 mg by mouth daily status post 36 months of treatment. She is tolerating her treatment with Tarceva fairly well.  I recommended for her to continue her current treatment with Tarceva. She is expected to start palliative radiotherapy to the enlarging right lung nodule under the care of Dr. Lisbeth Renshaw soon. For the chest wheezing, I advised the patient to continue with her inhaler every 6 hours as needed. For the cough, she will continue on Tussionex.  For the dry skin, the patient was advised to use lotions. For the paronychia of the right big toe, she was given a refill of doxycycline 100 mg by mouth twice a day. I also gave the patient prescription for Compazine 10 mg by mouth every 6 hours as needed for nausea. She will come back for follow-up visit in 4 weeks for reevaluation with repeat blood work. She was advised to call immediately if she has any concerning symptoms in the interval. The  patient voices understanding of current disease status and treatment options and is in agreement with the current care plan.  All questions were answered. The patient knows to call the clinic with any problems, questions or concerns. We can certainly see the patient much sooner if necessary.  Disclaimer: This note was dictated with voice recognition software. Similar sounding words can inadvertently be transcribed and may not be corrected upon review.

## 2016-03-21 NOTE — Telephone Encounter (Signed)
Gave and printed appt sched and avs fo rpt for May °

## 2016-03-23 ENCOUNTER — Ambulatory Visit
Admission: RE | Admit: 2016-03-23 | Discharge: 2016-03-23 | Disposition: A | Discharge status: Home / Self Care | Payer: Medicare Other | Source: Ambulatory Visit | Attending: Radiation Oncology | Admitting: Radiation Oncology

## 2016-03-23 DIAGNOSIS — Z510 Encounter for antineoplastic radiation therapy: Secondary | ICD-10-CM | POA: Diagnosis not present

## 2016-03-23 DIAGNOSIS — C3431 Malignant neoplasm of lower lobe, right bronchus or lung: Secondary | ICD-10-CM | POA: Diagnosis not present

## 2016-03-23 NOTE — Addendum Note (Signed)
Encounter addended by: Doreen Beam, RN on: 03/23/2016 11:17 AM<BR>     Documentation filed: Charges VN

## 2016-03-24 ENCOUNTER — Ambulatory Visit: Payer: Medicare Other | Admitting: Radiation Oncology

## 2016-03-25 ENCOUNTER — Ambulatory Visit: Payer: Medicare Other | Admitting: Radiation Oncology

## 2016-03-28 ENCOUNTER — Ambulatory Visit
Admission: RE | Admit: 2016-03-28 | Discharge: 2016-03-28 | Disposition: A | Discharge status: Home / Self Care | Payer: Medicare Other | Source: Ambulatory Visit | Attending: Radiation Oncology | Admitting: Radiation Oncology

## 2016-03-28 DIAGNOSIS — C3431 Malignant neoplasm of lower lobe, right bronchus or lung: Secondary | ICD-10-CM | POA: Diagnosis not present

## 2016-03-28 DIAGNOSIS — Z510 Encounter for antineoplastic radiation therapy: Secondary | ICD-10-CM | POA: Diagnosis not present

## 2016-03-31 ENCOUNTER — Encounter: Payer: Self-pay | Admitting: Radiation Oncology

## 2016-03-31 ENCOUNTER — Ambulatory Visit
Admission: RE | Admit: 2016-03-31 | Discharge: 2016-03-31 | Disposition: A | Discharge status: Home / Self Care | Payer: Medicare Other | Source: Ambulatory Visit | Attending: Radiation Oncology | Admitting: Radiation Oncology

## 2016-03-31 VITALS — BP 120/79 | HR 102 | Temp 98.3°F | Resp 16 | Ht <= 58 in | Wt 126.2 lb

## 2016-03-31 DIAGNOSIS — Z510 Encounter for antineoplastic radiation therapy: Secondary | ICD-10-CM | POA: Diagnosis not present

## 2016-03-31 DIAGNOSIS — C3431 Malignant neoplasm of lower lobe, right bronchus or lung: Secondary | ICD-10-CM | POA: Diagnosis not present

## 2016-03-31 NOTE — Progress Notes (Signed)
Mrs. Tally has received 3 SBRT treatments to her RLL.  No change in ski color to right chest.  Appetite is altered a few days.  Having some fatigue yesterday and day all day.  Denies pain. Coughing nonproductive, SOB at times,some problems swallowing no pain. Wt Readings from Last 3 Encounters:  03/31/16 126 lb 3.2 oz (57.244 kg)  03/21/16 127 lb 4.8 oz (57.743 kg)  02/29/16 130 lb 9.6 oz (59.24 kg)  BP 120/79 mmHg  Pulse 102  Temp(Src) 98.3 F (36.8 C) (Oral)  Resp 16  Ht 5" (0.127 m)  Wt 126 lb 3.2 oz (57.244 kg)  BMI 3549.14 kg/m2  SpO2 98%  LMP 02/10/2012  Weight down 4 pounds since 02-29-16 encouraged to continue to eat and drink to stay hydrated and keep up go0d nutritional intake.  EOT instructions given. BP 120/79 mmHg  Pulse 102  Temp(Src) 98.3 F (36.8 C) (Oral)  Resp 16  Ht 5" (0.127 m)  Wt 126 lb 3.2 oz (57.244 kg)  BMI 3549.14 kg/m2  SpO2 98%  LMP 02/10/2012

## 2016-03-31 NOTE — Progress Notes (Signed)
  Radiation Oncology         [phone] ________________________________  Name: [name] MRN: [id number]  Date: 03/31/2016  DOB: 1961-02-12  Weekly Radiation Therapy Management    ICD-9-CM ICD-10-CM   1. Primary cancer of right lower lobe of lung (HCC) 162.5 C34.31     Current Dose: 54 Gy     Planned Dose:  54 Gy  Narrative . . . . . . . . The patient presents for the final under treatment assessment.   [name] has received 3 SBRT treatments to her RLL. No change in ski color to right chest. Appetite is altered a few days. Having some fatigue yesterday and day all day. Denies pain. Coughing nonproductive, SOB at times,some problems swallowing no pain.                                 Set-up films were reviewed.                                 The chart was checked. Physical Findings. . . Weight essentially stable.  No significant changes. Impression . . . . . . . The patient tolerated radiation relatively well. Plan . . . . . . . . . . . . Complete radiation today as scheduled, and follow-up in one month. The patient was encouraged to call or return to the clinic in the interim for any worsening symptoms.  ________________________________  Sheral Apley Tammi Klippel, M.D.  This document serves as a record of services personally performed by Tyler Pita, MD. It was created on his behalf by Derek Mound, a trained medical scribe. The creation of this record is based on the scribe's personal observations and the provider's statements to them. This document has been checked and approved by the attending provider.

## 2016-04-18 ENCOUNTER — Encounter: Payer: Self-pay | Admitting: Oncology

## 2016-04-18 ENCOUNTER — Ambulatory Visit (HOSPITAL_BASED_OUTPATIENT_CLINIC_OR_DEPARTMENT_OTHER): Payer: Medicare Other | Admitting: Oncology

## 2016-04-18 ENCOUNTER — Other Ambulatory Visit (HOSPITAL_BASED_OUTPATIENT_CLINIC_OR_DEPARTMENT_OTHER): Payer: Medicare Other

## 2016-04-18 ENCOUNTER — Telehealth: Payer: Self-pay | Admitting: Oncology

## 2016-04-18 ENCOUNTER — Encounter: Payer: Self-pay | Admitting: *Deleted

## 2016-04-18 ENCOUNTER — Other Ambulatory Visit: Payer: Self-pay | Admitting: *Deleted

## 2016-04-18 VITALS — BP 130/74 | HR 96 | Temp 98.6°F | Resp 18 | Ht 60.0 in | Wt 129.7 lb

## 2016-04-18 DIAGNOSIS — R197 Diarrhea, unspecified: Secondary | ICD-10-CM

## 2016-04-18 DIAGNOSIS — R05 Cough: Secondary | ICD-10-CM | POA: Diagnosis not present

## 2016-04-18 DIAGNOSIS — Z95828 Presence of other vascular implants and grafts: Secondary | ICD-10-CM

## 2016-04-18 DIAGNOSIS — C3431 Malignant neoplasm of lower lobe, right bronchus or lung: Secondary | ICD-10-CM

## 2016-04-18 DIAGNOSIS — K219 Gastro-esophageal reflux disease without esophagitis: Secondary | ICD-10-CM

## 2016-04-18 DIAGNOSIS — C3491 Malignant neoplasm of unspecified part of right bronchus or lung: Secondary | ICD-10-CM

## 2016-04-18 LAB — CBC WITH DIFFERENTIAL/PLATELET
BASO%: 0.2 % (ref 0.0–2.0)
Basophils Absolute: 0 10*3/uL (ref 0.0–0.1)
EOS%: 1.1 % (ref 0.0–7.0)
Eosinophils Absolute: 0.1 10*3/uL (ref 0.0–0.5)
HCT: 41.1 % (ref 34.8–46.6)
HGB: 13.5 g/dL (ref 11.6–15.9)
LYMPH%: 19.1 % (ref 14.0–49.7)
MCH: 29 pg (ref 25.1–34.0)
MCHC: 32.8 g/dL (ref 31.5–36.0)
MCV: 88.2 fL (ref 79.5–101.0)
MONO#: 0.5 10*3/uL (ref 0.1–0.9)
MONO%: 5.6 % (ref 0.0–14.0)
NEUT#: 7 10*3/uL — ABNORMAL HIGH (ref 1.5–6.5)
NEUT%: 74 % (ref 38.4–76.8)
Platelets: 310 10*3/uL (ref 145–400)
RBC: 4.66 10*6/uL (ref 3.70–5.45)
RDW: 13.9 % (ref 11.2–14.5)
WBC: 9.4 10*3/uL (ref 3.9–10.3)
lymph#: 1.8 10*3/uL (ref 0.9–3.3)

## 2016-04-18 LAB — COMPREHENSIVE METABOLIC PANEL
ALT: 34 U/L (ref 0–55)
AST: 30 U/L (ref 5–34)
Albumin: 3.9 g/dL (ref 3.5–5.0)
Alkaline Phosphatase: 87 U/L (ref 40–150)
Anion Gap: 12 mEq/L — ABNORMAL HIGH (ref 3–11)
BUN: 9.1 mg/dL (ref 7.0–26.0)
CO2: 26 mEq/L (ref 22–29)
Calcium: 9.2 mg/dL (ref 8.4–10.4)
Chloride: 101 mEq/L (ref 98–109)
Creatinine: 0.7 mg/dL (ref 0.6–1.1)
EGFR: >90 mL/min/{1.73_m2} (ref >90)
Glucose: 104 mg/dl (ref 70–140)
Potassium: 4.1 mEq/L (ref 3.5–5.1)
Sodium: 139 mEq/L (ref 136–145)
Total Bilirubin: 0.52 mg/dL (ref 0.20–1.20)
Total Protein: 7.6 g/dL (ref 6.4–8.3)

## 2016-04-18 MED ORDER — HYDROCOD POLST-CPM POLST ER 10-8 MG/5ML PO SUER: 5.0000 mL | Freq: Two times a day (BID) | ORAL | Status: DC | PRN

## 2016-04-18 MED ORDER — ALBUTEROL SULFATE HFA 108 (90 BASE) MCG/ACT IN AERS: 2.0000 | INHALATION_SPRAY | Freq: Four times a day (QID) | RESPIRATORY_TRACT | Status: DC | PRN

## 2016-04-18 MED ORDER — OMEPRAZOLE 20 MG PO CPDR: 20.0000 mg | DELAYED_RELEASE_CAPSULE | Freq: Every day | ORAL | Status: DC

## 2016-04-18 MED ORDER — ERLOTINIB HCL 150 MG PO TABS: 150.0000 mg | ORAL_TABLET | Freq: Every day | ORAL | Status: DC

## 2016-04-18 NOTE — Progress Notes (Signed)
[facility] [phone]   Fax:(336) [phone]  OFFICE PROGRESS NOTE  [name], MD [place] [address]  PRINCIPAL DIAGNOSIS: Local recurrence of non-small cell lung cancer, adenocarcinoma, initially diagnosed as stage IB (T2a N0 M0) in March of 2010.   PRIOR THERAPY:  1. Status post right lower lobectomy under the care of Dr. Cyndia Bent on 04/08/2009. 2. Status post palliative radiotherapy to the right lower lobe recurrent lung mass under the care of Dr. Lisbeth Renshaw, completed on June 08, 2011. 3. Systemic chemotherapy with carboplatin for AUC of 5 and Alimta 500 mg/M2 every 3 weeks. She is status post 3 cycles. 4. Received 54Gy to her right lower lobe mass under the direction of Dr. Tammi Klippel. Competed on 03/31/2016.  CURRENT THERAPY: Tarceva 150 mg by mouth daily, therapy beginning 07/24/2012. Status post approximately 38 months of therapy.   CHEMOTHERAPY INTENT: Palliative  CURRENT # OF CHEMOTHERAPY CYCLES: 39 CURRENT ANTIEMETICS: Compazine  CURRENT SMOKING STATUS: Nonsmoker  ORAL CHEMOTHERAPY AND CONSENT: Tarceva  CURRENT BISPHOSPHONATES USE: None  PAIN MANAGEMENT: None  NARCOTICS INDUCED CONSTIPATION: None  LIVING WILL AND CODE STATUS: Full code  INTERVAL HISTORY: [name] 55 y.o. female returns to the clinic today for follow up visit accompanied by her interpreter. The patient is feeling fine today with no specific complaints except for the persistent dry cough and she is currently on Tussionex with improvement of her cough. Also uses her Albuterol inhaler as prescribed. Reports burning in her throat and esophagus. She has occasional episodes of diarrhea. She is tolerating her treatment with Tarceva fairly well. She has repeat bloodwork performed earlier today and she is here for evaluation and discussion of her lab results.  MEDICAL HISTORY: Past Medical History  Diagnosis Date  . History of radiation therapy 05/02/11 to 06/08/11      right lung  . Lung cancer ([facility])     right lower lobe adenocarcinoma  . Encounter for antineoplastic chemotherapy 01/19/2016    ALLERGIES:  is allergic to codeine and doxycycline.  MEDICATIONS:  Current Outpatient Prescriptions  Medication Sig Dispense Refill  . albuterol (PROVENTIL HFA;VENTOLIN HFA) 108 (90 Base) MCG/ACT inhaler Inhale 2 puffs into the lungs every 6 (six) hours as needed for wheezing. 1 Inhaler 1  . chlorpheniramine-HYDROcodone (TUSSIONEX) 10-8 MG/5ML SUER Take 5 mLs by mouth every 12 (twelve) hours as needed for cough. 140 mL 0  . clindamycin (CLEOCIN T) 1 % lotion Apply topically 2 (two) times daily. 60 mL 0  . lidocaine-prilocaine (EMLA) cream Apply to Port-A-Cath prior to tx PRN 5 g prn 1 year  . prochlorperazine (COMPAZINE) 10 MG tablet Take 1 tablet (10 mg total) by mouth every 6 (six) hours as needed for nausea or vomiting. 30 tablet 0  . erlotinib (TARCEVA) 150 MG tablet Take 1 tablet (150 mg total) by mouth daily. 30 tablet 0  . omeprazole (PRILOSEC) 20 MG capsule Take 1 capsule (20 mg total) by mouth daily. 30 capsule 1   No current facility-administered medications for this visit.    SURGICAL HISTORY:  Past Surgical History  Procedure Laterality Date  . Lung lobectomy  04/18/2009    RLL  . Portacath placement  02/29/2012    Procedure: INSERTION PORT-A-CATH;  Surgeon: Nicanor Alcon, MD;  Location: [facility];  Service: Thoracic;  Laterality: Left;  PowerPort 8 F attachable in left internal jugular.    REVIEW OF SYSTEMS:  A comprehensive review of systems was negative  except for: Constitutional: positive for fatigue Gastrointestinal: positive for diarrhea and nausea   PHYSICAL EXAMINATION: General appearance: alert, cooperative and no distress Head: Normocephalic, without obvious abnormality, atraumatic Neck: no adenopathy, no JVD, supple, symmetrical, trachea midline and thyroid not enlarged, symmetric, no tenderness/mass/nodules Lymph nodes: Cervical,  supraclavicular, and axillary nodes normal. Resp: wheezes bilaterally Back: symmetric, no curvature. ROM normal. No CVA tenderness. Cardio: regular rate and rhythm, S1, S2 normal, no murmur, click, rub or gallop GI: soft, non-tender; bowel sounds normal; no masses,  no organomegaly Extremities: extremities normal, atraumatic, no cyanosis or edema Neurologic: Alert and oriented X 3, normal strength and tone. Normal symmetric reflexes. Normal coordination and gait  ECOG PERFORMANCE STATUS: 1 - Symptomatic but completely ambulatory  Blood pressure 130/74, pulse 96, temperature 98.6 F (37 C), temperature source Oral, resp. rate 18, height 5' (1.524 m), weight 129 lb 11.2 oz (58.832 kg), last menstrual period 02/10/2012, SpO2 100 %.  LABORATORY DATA: Lab Results  Component Value Date   WBC 9.4 04/18/2016   HGB 13.5 04/18/2016   HCT 41.1 04/18/2016   MCV 88.2 04/18/2016   PLT 310 04/18/2016      Chemistry      Component Value Date/Time   NA 139 04/18/2016 0909   NA 140 09/09/2014 0410   NA 141 01/06/2012 0804   K 4.1 04/18/2016 0909   K 3.9 09/09/2014 0410   K 4.6 01/06/2012 0804   CL 102 09/09/2014 0410   CL 102 04/30/2013 1002   CL 98 01/06/2012 0804   CO2 26 04/18/2016 0909   CO2 28 09/09/2014 0410   CO2 28 01/06/2012 0804   BUN 9.1 04/18/2016 0909   BUN 8 09/09/2014 0410   BUN 10 01/06/2012 0804   CREATININE 0.7 04/18/2016 0909   CREATININE 0.46* 09/09/2014 0410   CREATININE 0.6 01/06/2012 0804      Component Value Date/Time   CALCIUM 9.2 04/18/2016 0909   CALCIUM 8.7 09/09/2014 0410   CALCIUM 8.9 01/06/2012 0804   ALKPHOS 87 04/18/2016 0909   ALKPHOS 93 07/12/2012 1121   ALKPHOS 64 01/06/2012 0804   AST 30 04/18/2016 0909   AST 44* 07/12/2012 1121   AST 27 01/06/2012 0804   ALT 34 04/18/2016 0909   ALT 49* 07/12/2012 1121   ALT 24 01/06/2012 0804   BILITOT 0.52 04/18/2016 0909   BILITOT 0.3 07/12/2012 1121   BILITOT 0.60 01/06/2012 0804        RADIOGRAPHIC STUDIES: No results found.  ASSESSMENT AND PLAN: This is a very pleasant 55 year old Asian female with recurrent non-small cell lung cancer, adenocarcinoma. She is currently on treatment with oral Tarceva 150 mg by mouth daily status post 38 months of treatment. She is tolerating her treatment with Tarceva fairly well.  I recommended for her to continue her current treatment with Tarceva.  For the chest wheezing, I advised the patient to continue with her inhaler every 6 hours as needed. Refill was given today. Of note, her albuterol inhaler was completely empty.   For the cough, she will continue on Tussionex. Refill given today. Will also try her on a PPI due to complaints of burning in her throat and esophagus as this might also help her cough.   She will come back for follow-up visit in 4 weeks for reevaluation with repeat blood work. She was advised to call immediately if she has any concerning symptoms in the interval. The patient voices understanding of current disease status and treatment options  and is in agreement with the current care plan.  All questions were answered. The patient knows to call the clinic with any problems, questions or concerns. We can certainly see the patient much sooner if necessary.  Mikey Bussing, DNP, AGPCNP-BC, AOCNP

## 2016-04-18 NOTE — Telephone Encounter (Signed)
per pof to sch pt appt-gave pt copy of avs °

## 2016-04-18 NOTE — Progress Notes (Signed)
Oncology Nurse Navigator Documentation  Oncology Nurse Navigator Flowsheets 04/18/2016  Navigator Location [facility]  Navigator Encounter Type Clinic/[facility]  Treatment Phase Treatment  Barriers/Navigation Needs No barriers at this time  Interventions Other  Acuity Level 1  Acuity Level 1 Minimal follow up required  Time Spent with Patient 15   Spoke with patient and interpretor today.  Patient is doing well.  No barriers at this time just support given.

## 2016-04-20 ENCOUNTER — Encounter: Payer: Self-pay | Admitting: Internal Medicine

## 2016-04-20 NOTE — Progress Notes (Signed)
Faxed [employer] for tarceva

## 2016-05-05 ENCOUNTER — Encounter: Payer: Self-pay | Admitting: Internal Medicine

## 2016-05-05 NOTE — Progress Notes (Signed)
Per [employer] the patient has [employer] now? They need confirmation. I have note to get card when he comes in again

## 2016-05-05 NOTE — Progress Notes (Signed)
I told York Cerise we are waiting for patient to come in to get the [employer] card#. They found out she had-she told them

## 2016-05-10 ENCOUNTER — Encounter: Payer: Self-pay | Admitting: Radiation Oncology

## 2016-05-10 ENCOUNTER — Ambulatory Visit
Admission: RE | Admit: 2016-05-10 | Discharge: 2016-05-10 | Disposition: A | Discharge status: Home / Self Care | Payer: Medicare Other | Source: Ambulatory Visit | Attending: Radiation Oncology | Admitting: Radiation Oncology

## 2016-05-10 VITALS — BP 111/74 | HR 98 | Temp 98.4°F | Resp 16 | Ht 60.0 in | Wt 129.2 lb

## 2016-05-10 DIAGNOSIS — L0889 Other specified local infections of the skin and subcutaneous tissue: Secondary | ICD-10-CM | POA: Diagnosis not present

## 2016-05-10 DIAGNOSIS — Z923 Personal history of irradiation: Secondary | ICD-10-CM | POA: Insufficient documentation

## 2016-05-10 DIAGNOSIS — C3431 Malignant neoplasm of lower lobe, right bronchus or lung: Secondary | ICD-10-CM | POA: Insufficient documentation

## 2016-05-10 DIAGNOSIS — Z885 Allergy status to narcotic agent status: Secondary | ICD-10-CM | POA: Diagnosis not present

## 2016-05-10 DIAGNOSIS — R110 Nausea: Secondary | ICD-10-CM | POA: Diagnosis not present

## 2016-05-10 DIAGNOSIS — R05 Cough: Secondary | ICD-10-CM | POA: Insufficient documentation

## 2016-05-10 DIAGNOSIS — K219 Gastro-esophageal reflux disease without esophagitis: Secondary | ICD-10-CM | POA: Insufficient documentation

## 2016-05-10 DIAGNOSIS — Z881 Allergy status to other antibiotic agents status: Secondary | ICD-10-CM | POA: Diagnosis not present

## 2016-05-10 NOTE — Progress Notes (Addendum)
Ms. Shrestha here for reassessment x/pm XRT to her right lower lobe. VSS.  She denies fatigue, but reports intermittent abdominal pain.   BP 111/74 mmHg  Pulse 98  Temp(Src) 98.4 F (36.9 C)  Resp 16  Ht 5' (1.524 m)  Wt 129 lb 3.2 oz (58.605 kg)  BMI 25.23 kg/m2  SpO2 99%  LMP 02/10/2012   Wt Readings from Last 3 Encounters:  05/10/16 129 lb 3.2 oz (58.605 kg)  04/18/16 129 lb 11.2 oz (58.832 kg)  03/31/16 126 lb 3.2 oz (57.244 kg)

## 2016-05-10 NOTE — Progress Notes (Signed)
Radiation Oncology         [phone] ________________________________  Name: [name] MRN: [id number]  Date: 05/10/2016  DOB: 1961/01/17  Follow-Up Visit Note  CC: [name], MD  Curt Bears, MD  Diagnosis:   Recurrent stage IB (T2aN0M0) NSCLC, adenocarcinoma of the right lower lobe with recurrence in 2017  Interval Since Last Radiation:  5 weeks  03/23/16-03/31/16: SBRT to the right lower lobe, 54 Gy in 3 fractions.  05/02/11-06/08/11: palliative radiotherapy to the right lower lobe.    Narrative:  The patient returns today for routine follow-up.  Apparently the patient is continued on her Tarceva and follow up with Dr. Julien Nordmann. She is been treated for an infection in the skin her eyebrows and is currently on doxycycline. She comes today for follow-up since completing her SBRT.                             On review of systems, the patient states that she is doing well overall. She denies any shortness of breath. She continues to have a dry nonproductive cough and states that this is more noticeable when it is humid or when she is showering. She continues to take cough syrup for her symptoms. She denies any fevers or chills. She reports that her dysphagia during radiation has completely resolved. Continues to take Pepcid for acid reflux. She does describe nausea and a few episodes of emesis while trying to take doxycycline for her eyebrows infection she requests recommendations on how this should be managed. No other complaints or verbalized.  ALLERGIES:  is allergic to codeine and doxycycline.  Meds: Current Outpatient Prescriptions  Medication Sig Dispense Refill  . albuterol (PROVENTIL HFA;VENTOLIN HFA) 108 (90 Base) MCG/ACT inhaler Inhale 2 puffs into the lungs every 6 (six) hours as needed for wheezing. 1 Inhaler 1  . chlorpheniramine-HYDROcodone (TUSSIONEX) 10-8 MG/5ML SUER Take 5 mLs by mouth every 12 (twelve) hours as needed for cough. 140 mL 0  . clindamycin (CLEOCIN  T) 1 % lotion Apply topically 2 (two) times daily. 60 mL 0  . erlotinib (TARCEVA) 150 MG tablet Take 1 tablet (150 mg total) by mouth daily. 30 tablet 0  . lidocaine-prilocaine (EMLA) cream Apply to Port-A-Cath prior to tx PRN 5 g prn 1 year  . prochlorperazine (COMPAZINE) 10 MG tablet Take 1 tablet (10 mg total) by mouth every 6 (six) hours as needed for nausea or vomiting. 30 tablet 0  . omeprazole (PRILOSEC) 20 MG capsule Take 1 capsule (20 mg total) by mouth daily. (Patient not taking: Reported on 05/10/2016) 30 capsule 1   No current facility-administered medications for this encounter.    Physical Findings:  height is 5' (1.524 m) and weight is 129 lb 3.2 oz (58.605 kg). Her temperature is 98.4 F (36.9 C). Her blood pressure is 111/74 and her pulse is 98. Her respiration is 16 and oxygen saturation is 99%.   In general this is a well appearing asian female in no acute distress. She is alert and oriented x4 and appropriate throughout the examination. HEENT reveals that the patient is normocephalic, atraumatic. EOMs are intact. PERRLA. There are erythematous changes of the skin of her eyebrows bilaterally. Cardiovascular exam reveals a regular rate and rhythm, no clicks rubs or murmurs are auscultated. Chest is clear to auscultation bilaterally.   Lab Findings: Lab Results  Component Value Date   WBC 9.4 04/18/2016   HGB 13.5 04/18/2016  HCT 41.1 04/18/2016   MCV 88.2 04/18/2016   PLT 310 04/18/2016     Radiographic Findings: No results found.  Impression/Plan: 1. Recurrent right lower lobe non-small cell carcinoma of the lung. The patient appears to be stable and doing well since her radiotherapy was completed. We will communicate with Dr. Julien Nordmann to determine when her next CT scan will be performed, and we would recommend that she have her next scan from radiation perspective in approximately 2-3 weeks. If he is in agreement, we will continue to follow along peripherally when he  orders CT scan that she is currently on systemic therapy. She is encouraged to call if she has any questions or concerns prior to her next visit with Dr. Julien Nordmann. 2. Soft tissue infection of the skin of the eyebrows. The patient is encouraged to continue her discussion about her antibiotic therapy with Dr. Julien Nordmann. Although initially she was concerned that she was having allergic reaction, it sounds as though she was having gastrointestinal upset due to her antibiotic therapy versus symptoms related transverse therapy. She will keep Dr. Julien Nordmann informed, and in the meantime if she is not interested in taking her antibiotics, I suggested that she use an over-the-counter antibiotic ointment. She states agreement.     Carola Rhine, PAC

## 2016-05-11 NOTE — Addendum Note (Signed)
Encounter addended by: Benn Moulder, RN on: 05/11/2016  6:53 PM<BR>     Documentation filed: Charges VN

## 2016-05-16 ENCOUNTER — Encounter: Payer: Self-pay | Admitting: Internal Medicine

## 2016-05-16 ENCOUNTER — Other Ambulatory Visit (HOSPITAL_BASED_OUTPATIENT_CLINIC_OR_DEPARTMENT_OTHER): Payer: Medicare Other

## 2016-05-16 ENCOUNTER — Ambulatory Visit (HOSPITAL_BASED_OUTPATIENT_CLINIC_OR_DEPARTMENT_OTHER): Payer: Self-pay | Admitting: Internal Medicine

## 2016-05-16 ENCOUNTER — Telehealth: Payer: Self-pay | Admitting: Internal Medicine

## 2016-05-16 VITALS — BP 124/82 | HR 90 | Temp 98.4°F | Resp 18 | Ht 60.0 in | Wt 129.7 lb

## 2016-05-16 DIAGNOSIS — C3431 Malignant neoplasm of lower lobe, right bronchus or lung: Secondary | ICD-10-CM

## 2016-05-16 DIAGNOSIS — R21 Rash and other nonspecific skin eruption: Secondary | ICD-10-CM

## 2016-05-16 DIAGNOSIS — C3491 Malignant neoplasm of unspecified part of right bronchus or lung: Secondary | ICD-10-CM

## 2016-05-16 DIAGNOSIS — C3490 Malignant neoplasm of unspecified part of unspecified bronchus or lung: Secondary | ICD-10-CM

## 2016-05-16 DIAGNOSIS — Z5111 Encounter for antineoplastic chemotherapy: Secondary | ICD-10-CM

## 2016-05-16 DIAGNOSIS — Z95828 Presence of other vascular implants and grafts: Secondary | ICD-10-CM

## 2016-05-16 DIAGNOSIS — R05 Cough: Secondary | ICD-10-CM

## 2016-05-16 LAB — CBC WITH DIFFERENTIAL/PLATELET
BASO%: 0.5 % (ref 0.0–2.0)
Basophils Absolute: 0 10*3/uL (ref 0.0–0.1)
EOS%: 1 % (ref 0.0–7.0)
Eosinophils Absolute: 0.1 10*3/uL (ref 0.0–0.5)
HCT: 42 % (ref 34.8–46.6)
HGB: 13.7 g/dL (ref 11.6–15.9)
LYMPH%: 20.8 % (ref 14.0–49.7)
MCH: 28.2 pg (ref 25.1–34.0)
MCHC: 32.7 g/dL (ref 31.5–36.0)
MCV: 86.5 fL (ref 79.5–101.0)
MONO#: 0.6 10*3/uL (ref 0.1–0.9)
MONO%: 7.5 % (ref 0.0–14.0)
NEUT#: 5.3 10*3/uL (ref 1.5–6.5)
NEUT%: 70.2 % (ref 38.4–76.8)
Platelets: 325 10*3/uL (ref 145–400)
RBC: 4.85 10*6/uL (ref 3.70–5.45)
RDW: 14 % (ref 11.2–14.5)
WBC: 7.6 10*3/uL (ref 3.9–10.3)
lymph#: 1.6 10*3/uL (ref 0.9–3.3)

## 2016-05-16 LAB — COMPREHENSIVE METABOLIC PANEL
ALT: 35 U/L (ref 0–55)
AST: 31 U/L (ref 5–34)
Albumin: 3.8 g/dL (ref 3.5–5.0)
Alkaline Phosphatase: 101 U/L (ref 40–150)
Anion Gap: 9 mEq/L (ref 3–11)
BUN: 13.7 mg/dL (ref 7.0–26.0)
CO2: 30 mEq/L — ABNORMAL HIGH (ref 22–29)
Calcium: 9.5 mg/dL (ref 8.4–10.4)
Chloride: 104 mEq/L (ref 98–109)
Creatinine: 0.8 mg/dL (ref 0.6–1.1)
EGFR: 87 mL/min/{1.73_m2} — ABNORMAL LOW (ref >90)
Glucose: 115 mg/dl (ref 70–140)
Potassium: 4.4 mEq/L (ref 3.5–5.1)
Sodium: 142 mEq/L (ref 136–145)
Total Bilirubin: 0.38 mg/dL (ref 0.20–1.20)
Total Protein: 7.7 g/dL (ref 6.4–8.3)

## 2016-05-16 MED ORDER — CLINDAMYCIN PHOSPHATE 1 % EX LOTN: TOPICAL_LOTION | Freq: Two times a day (BID) | CUTANEOUS | Status: DC

## 2016-05-16 NOTE — Telephone Encounter (Signed)
Gave pt apt & avs °

## 2016-05-16 NOTE — Progress Notes (Signed)
[facility] [phone]   [phone]  OFFICE PROGRESS NOTE  [name], MD [place] [id number]  PRINCIPAL DIAGNOSIS: Local recurrence of non-small cell lung cancer, adenocarcinoma, initially diagnosed as stage IB (T2a N0 M0) in March of 2010.   PRIOR THERAPY:  1. Status post right lower lobectomy under the care of Dr. Cyndia Bent on 04/08/2009. 2. Status post palliative radiotherapy to the right lower lobe recurrent lung mass under the care of Dr. Lisbeth Renshaw, completed on June 08, 2011. 3. Systemic chemotherapy with carboplatin for AUC of 5 and Alimta 500 mg/M2 every 3 weeks. She is status post 3 cycles.  CURRENT THERAPY: Tarceva 150 mg by mouth daily, therapy beginning 07/24/2012. Status post approximately 39 months of therapy.   CHEMOTHERAPY INTENT: Palliative  CURRENT # OF CHEMOTHERAPY CYCLES: 40 CURRENT ANTIEMETICS: Compazine  CURRENT SMOKING STATUS: Nonsmoker  ORAL CHEMOTHERAPY AND CONSENT: Tarceva  CURRENT BISPHOSPHONATES USE: None  PAIN MANAGEMENT: None  NARCOTICS INDUCED CONSTIPATION: None  LIVING WILL AND CODE STATUS: Full code  INTERVAL HISTORY: [name] 55 y.o. female returns to the clinic today for follow up visit accompanied by her son. The patient is feeling fine today with no specific complaints except for the persistent dry cough and she is currently on Tussionex with improvement of her cough. She continues to have skin rash involving mainly the right eyebrow. She has occasional episodes of diarrhea. She is tolerating her treatment with Tarceva fairly well. She has repeat bloodwork performed earlier today and she is here for evaluation and discussion of her lab results.  MEDICAL HISTORY: Past Medical History  Diagnosis Date  . History of radiation therapy 05/02/11 to 06/08/11    right lung  . Lung cancer ([place])     right lower lobe adenocarcinoma  . Encounter for antineoplastic chemotherapy 01/19/2016     ALLERGIES:  is allergic to codeine and doxycycline.  MEDICATIONS:  Current Outpatient Prescriptions  Medication Sig Dispense Refill  . albuterol (PROVENTIL HFA;VENTOLIN HFA) 108 (90 Base) MCG/ACT inhaler Inhale 2 puffs into the lungs every 6 (six) hours as needed for wheezing. 1 Inhaler 1  . chlorpheniramine-HYDROcodone (TUSSIONEX) 10-8 MG/5ML SUER Take 5 mLs by mouth every 12 (twelve) hours as needed for cough. 140 mL 0  . clindamycin (CLEOCIN T) 1 % lotion Apply topically 2 (two) times daily. 60 mL 0  . erlotinib (TARCEVA) 150 MG tablet Take 1 tablet (150 mg total) by mouth daily. 30 tablet 0  . lidocaine-prilocaine (EMLA) cream Apply to Port-A-Cath prior to tx PRN 5 g prn 1 year  . omeprazole (PRILOSEC) 20 MG capsule Take 1 capsule (20 mg total) by mouth daily. (Patient not taking: Reported on 05/10/2016) 30 capsule 1  . prochlorperazine (COMPAZINE) 10 MG tablet Take 1 tablet (10 mg total) by mouth every 6 (six) hours as needed for nausea or vomiting. 30 tablet 0   No current facility-administered medications for this visit.    SURGICAL HISTORY:  Past Surgical History  Procedure Laterality Date  . Lung lobectomy  04/18/2009    RLL  . Portacath placement  02/29/2012    Procedure: INSERTION PORT-A-CATH;  Surgeon: Nicanor Alcon, MD;  Location: [facility];  Service: Thoracic;  Laterality: Left;  PowerPort 8 F attachable in left internal jugular.    REVIEW OF SYSTEMS:  A comprehensive review of systems was negative except for: Gastrointestinal: positive for diarrhea Integument/breast: positive for rash   PHYSICAL EXAMINATION: General appearance:  alert, cooperative and no distress Head: Normocephalic, without obvious abnormality, atraumatic Neck: no adenopathy, no JVD, supple, symmetrical, trachea midline and thyroid not enlarged, symmetric, no tenderness/mass/nodules Lymph nodes: Cervical, supraclavicular, and axillary nodes normal. Resp: wheezes bilaterally Back: symmetric, no  curvature. ROM normal. No CVA tenderness. Cardio: regular rate and rhythm, S1, S2 normal, no murmur, click, rub or gallop GI: soft, non-tender; bowel sounds normal; no masses,  no organomegaly Extremities: extremities normal, atraumatic, no cyanosis or edema Neurologic: Alert and oriented X 3, normal strength and tone. Normal symmetric reflexes. Normal coordination and gait  ECOG PERFORMANCE STATUS: 1 - Symptomatic but completely ambulatory  Last menstrual period 02/10/2012.  LABORATORY DATA: Lab Results  Component Value Date   WBC 9.4 04/18/2016   HGB 13.5 04/18/2016   HCT 41.1 04/18/2016   MCV 88.2 04/18/2016   PLT 310 04/18/2016      Chemistry      Component Value Date/Time   NA 139 04/18/2016 0909   NA 140 09/09/2014 0410   NA 141 01/06/2012 0804   K 4.1 04/18/2016 0909   K 3.9 09/09/2014 0410   K 4.6 01/06/2012 0804   CL 102 09/09/2014 0410   CL 102 04/30/2013 1002   CL 98 01/06/2012 0804   CO2 26 04/18/2016 0909   CO2 28 09/09/2014 0410   CO2 28 01/06/2012 0804   BUN 9.1 04/18/2016 0909   BUN 8 09/09/2014 0410   BUN 10 01/06/2012 0804   CREATININE 0.7 04/18/2016 0909   CREATININE 0.46* 09/09/2014 0410   CREATININE 0.6 01/06/2012 0804      Component Value Date/Time   CALCIUM 9.2 04/18/2016 0909   CALCIUM 8.7 09/09/2014 0410   CALCIUM 8.9 01/06/2012 0804   ALKPHOS 87 04/18/2016 0909   ALKPHOS 93 07/12/2012 1121   ALKPHOS 64 01/06/2012 0804   AST 30 04/18/2016 0909   AST 44* 07/12/2012 1121   AST 27 01/06/2012 0804   ALT 34 04/18/2016 0909   ALT 49* 07/12/2012 1121   ALT 24 01/06/2012 0804   BILITOT 0.52 04/18/2016 0909   BILITOT 0.3 07/12/2012 1121   BILITOT 0.60 01/06/2012 0804       RADIOGRAPHIC STUDIES: No results found.  ASSESSMENT AND PLAN: This is a very pleasant 55 years old Asian female with recurrent non-small cell lung cancer, adenocarcinoma. She is currently on treatment with oral Tarceva 150 mg by mouth daily status post 39 months of  treatment. She is tolerating her treatment with Tarceva fairly well.  I recommended for her to continue her current treatment with Tarceva. For the chest wheezing, I advised the patient to continue with her inhaler every 6 hours as needed. For the cough, she will continue on Tussionex.  For the dry skin, the patient was advised to use lotions. I will send refill of clindamycin lotion to her pharmacy. She will come back for follow-up visit in 4 weeks for reevaluation with repeat blood work as well as CT scan of the chest for restaging of her disease. She was advised to call immediately if she has any concerning symptoms in the interval. The patient voices understanding of current disease status and treatment options and is in agreement with the current care plan.  All questions were answered. The patient knows to call the clinic with any problems, questions or concerns. We can certainly see the patient much sooner if necessary.  Disclaimer: This note was dictated with voice recognition software. Similar sounding words can inadvertently be transcribed and may not  be corrected upon review.

## 2016-05-23 ENCOUNTER — Telehealth: Payer: Self-pay | Admitting: Medical Oncology

## 2016-05-23 ENCOUNTER — Other Ambulatory Visit: Payer: Self-pay | Admitting: Medical Oncology

## 2016-05-23 DIAGNOSIS — C3491 Malignant neoplasm of unspecified part of right bronchus or lung: Secondary | ICD-10-CM

## 2016-05-23 DIAGNOSIS — Z95828 Presence of other vascular implants and grafts: Secondary | ICD-10-CM

## 2016-05-23 DIAGNOSIS — C3431 Malignant neoplasm of lower lobe, right bronchus or lung: Secondary | ICD-10-CM

## 2016-05-23 MED ORDER — ERLOTINIB HCL 150 MG PO TABS: 150.0000 mg | ORAL_TABLET | Freq: Every day | ORAL | Status: DC

## 2016-05-23 NOTE — Telephone Encounter (Signed)
Tarceva now being filled by [facility] fax [phone]

## 2016-05-24 NOTE — Progress Notes (Signed)
  Radiation Oncology         [phone]) [phone] ________________________________  Name: [name] MRN: [id number]  Date: 03/31/2016  DOB: [birth year]  End of Treatment Note  Diagnosis:   Non-small cell lung cancer     Indication for treatment:  Curative       Radiation treatment dates:   03/23/2016 through 03/31/2016  Site/dose:   The tumor in the right lower lobe was treated with a course of stereotactic body radiation treatment. The patient received 54 Gy In 3 fractions at 18 G per fraction.  Narrative: The patient tolerated radiation treatment relatively well.   The patient did not have any signs of acute toxicity during treatment.  Plan: The patient has completed radiation treatment. The patient will return to radiation oncology clinic for routine followup in one month. I advised the patient to call or return sooner if they have any questions or concerns related to their recovery or treatment.   ------------------------------------------------  Jodelle Gross, MD, PhD

## 2016-05-24 NOTE — Progress Notes (Signed)
[facility] Simulation and Treatment Planning Note   Name:  '@PATNAME'$ @ MRN: [id number]   Date: 05/24/2016  DOB: [birth year]  Status:outpatient    DIAGNOSIS: '@CURRDX'$ @   CONSENT VERIFIED:yes   SET UP: Patient is setup supine   IMMOBILIZATION: The patient was immobilized using a Vac Loc bag and Abdominal Compression.   NARRATIVE:The patient was brought to the [facility].  Identity was confirmed.  All relevant records and images related to the planned course of therapy were reviewed.  Then, the patient was positioned in a stable reproducible clinical set-up for radiation therapy. Abdominal compression was applied by me.  4D CT images were obtained and reproducible breathing pattern was confirmed. Free breathing CT images were obtained.  Skin markings were placed.  The CT images were loaded into the planning software where the target and avoidance structures were contoured.  The radiation prescription was entered and confirmed.    TREATMENT PLANNING NOTE:  Treatment planning then occurred. I have requested : MLC's, isodose plan, basic dose calculation.  3 dimensional simulation is performed and dose volume histogram of the gross tumor volume, planning tumor volume and criticial normal structures including the spinal cord and lungs were analyzed and requested.  Special treatment procedure was performed due to high dose per fraction.  The patient will be monitored for increased risk of toxicity.  Daily imaging using cone beam CT will be used for target localization.  ------------------------------------------------  Jodelle Gross, MD, PhD

## 2016-05-24 NOTE — Addendum Note (Signed)
Encounter addended by: Kyung Rudd, MD on: 05/24/2016  8:31 PM<BR>     Documentation filed: Notes Section, Visit Diagnoses

## 2016-05-25 ENCOUNTER — Other Ambulatory Visit: Payer: Self-pay | Admitting: Medical Oncology

## 2016-05-25 ENCOUNTER — Telehealth: Payer: Self-pay | Admitting: Medical Oncology

## 2016-05-25 DIAGNOSIS — C3491 Malignant neoplasm of unspecified part of right bronchus or lung: Secondary | ICD-10-CM

## 2016-05-25 DIAGNOSIS — C3431 Malignant neoplasm of lower lobe, right bronchus or lung: Secondary | ICD-10-CM

## 2016-05-25 DIAGNOSIS — Z95828 Presence of other vascular implants and grafts: Secondary | ICD-10-CM

## 2016-05-25 MED ORDER — ERLOTINIB HCL 150 MG PO TABS: 150.0000 mg | ORAL_TABLET | Freq: Every day | ORAL | Status: DC

## 2016-05-25 NOTE — Telephone Encounter (Signed)
refaxed to [facility] .

## 2016-05-27 ENCOUNTER — Telehealth: Payer: Self-pay | Admitting: Medical Oncology

## 2016-05-27 NOTE — Telephone Encounter (Addendum)
Spoke to [place] at [facility]. Prior auth number obtained for 6 months -auth # [place] will fax auth to [facility] rx speciality [phone].

## 2016-06-06 ENCOUNTER — Ambulatory Visit (HOSPITAL_BASED_OUTPATIENT_CLINIC_OR_DEPARTMENT_OTHER): Payer: PRIVATE HEALTH INSURANCE | Admitting: Nurse Practitioner

## 2016-06-06 ENCOUNTER — Telehealth: Payer: Self-pay

## 2016-06-06 ENCOUNTER — Telehealth: Payer: Self-pay | Admitting: Nurse Practitioner

## 2016-06-06 ENCOUNTER — Other Ambulatory Visit (HOSPITAL_BASED_OUTPATIENT_CLINIC_OR_DEPARTMENT_OTHER): Payer: PRIVATE HEALTH INSURANCE

## 2016-06-06 ENCOUNTER — Encounter: Payer: Self-pay | Admitting: Nurse Practitioner

## 2016-06-06 VITALS — BP 117/77 | HR 94 | Temp 98.3°F | Resp 17 | Ht 60.0 in | Wt 128.9 lb

## 2016-06-06 DIAGNOSIS — C3431 Malignant neoplasm of lower lobe, right bronchus or lung: Secondary | ICD-10-CM

## 2016-06-06 DIAGNOSIS — C3491 Malignant neoplasm of unspecified part of right bronchus or lung: Secondary | ICD-10-CM

## 2016-06-06 DIAGNOSIS — Z95828 Presence of other vascular implants and grafts: Secondary | ICD-10-CM

## 2016-06-06 DIAGNOSIS — L600 Ingrowing nail: Secondary | ICD-10-CM | POA: Diagnosis not present

## 2016-06-06 DIAGNOSIS — Z5111 Encounter for antineoplastic chemotherapy: Secondary | ICD-10-CM

## 2016-06-06 LAB — COMPREHENSIVE METABOLIC PANEL
ALT: 36 U/L (ref 0–55)
AST: 36 U/L — ABNORMAL HIGH (ref 5–34)
Albumin: 3.6 g/dL (ref 3.5–5.0)
Alkaline Phosphatase: 83 U/L (ref 40–150)
Anion Gap: 9 mEq/L (ref 3–11)
BUN: 11.6 mg/dL (ref 7.0–26.0)
CO2: 26 mEq/L (ref 22–29)
Calcium: 9 mg/dL (ref 8.4–10.4)
Chloride: 106 mEq/L (ref 98–109)
Creatinine: 0.6 mg/dL (ref 0.6–1.1)
EGFR: >90 mL/min/{1.73_m2} (ref >90)
Glucose: 107 mg/dl (ref 70–140)
Potassium: 4.1 mEq/L (ref 3.5–5.1)
Sodium: 141 mEq/L (ref 136–145)
Total Bilirubin: 0.5 mg/dL (ref 0.20–1.20)
Total Protein: 7.1 g/dL (ref 6.4–8.3)

## 2016-06-06 LAB — CBC WITH DIFFERENTIAL/PLATELET
BASO%: 0.5 % (ref 0.0–2.0)
Basophils Absolute: 0 10*3/uL (ref 0.0–0.1)
EOS%: 0.5 % (ref 0.0–7.0)
Eosinophils Absolute: 0 10*3/uL (ref 0.0–0.5)
HCT: 38.8 % (ref 34.8–46.6)
HGB: 12.7 g/dL (ref 11.6–15.9)
LYMPH%: 17.7 % (ref 14.0–49.7)
MCH: 28.1 pg (ref 25.1–34.0)
MCHC: 32.8 g/dL (ref 31.5–36.0)
MCV: 85.8 fL (ref 79.5–101.0)
MONO#: 0.4 10*3/uL (ref 0.1–0.9)
MONO%: 5.7 % (ref 0.0–14.0)
NEUT#: 5.7 10*3/uL (ref 1.5–6.5)
NEUT%: 75.6 % (ref 38.4–76.8)
Platelets: 275 10*3/uL (ref 145–400)
RBC: 4.52 10*6/uL (ref 3.70–5.45)
RDW: 13.7 % (ref 11.2–14.5)
WBC: 7.5 10*3/uL (ref 3.9–10.3)
lymph#: 1.3 10*3/uL (ref 0.9–3.3)

## 2016-06-06 MED ORDER — HYDROCOD POLST-CPM POLST ER 10-8 MG/5ML PO SUER: 5.0000 mL | Freq: Two times a day (BID) | ORAL | Status: DC | PRN

## 2016-06-06 MED ORDER — CEPHALEXIN 500 MG PO CAPS: 500.0000 mg | ORAL_CAPSULE | Freq: Four times a day (QID) | ORAL | Status: DC

## 2016-06-06 NOTE — Progress Notes (Signed)
SYMPTOM MANAGEMENT CLINIC    Chief Complaint: Ingrown toenail  HPI:  [name] 55 y.o. female diagnosed with lung cancer.  Currently undergoing Tarceva oral chemotherapy.   Patient presents to the [facility] today with complaint of a right big toe ingrown toenail.  She states that he has been bothering her intermittently for the last 2 months.  She states that it occasionally drains from the right corner of the toenail.  She denies any recent fevers or chills.  Exam today reveals that the right great toe is edematous and mildly tender with palpation to the right corner of the nail itself.  It does appear that the toenail has been clipped to the deep right corner of the nail; and it does appear that there is some dried drainage at that site as well.  The air is no erythema, warmth, or red streaks to the site.  Also, there is no edema to the foot itself.  Reviewed all findings with Dr. Julien Nordmann; he recommended patient be referred to a podiatrist.  Was able to arrange for patient to see a podiatrist, Dr[facility] [place] on 06/22/2016 at 9:30 in the morning.  Advised patient tells very important that patient go to the podiatrist appointment; even if her toe is looking better or resolved following treatment with antibiotics.  Also, will need a translator at this visit.  This provider has requested a translator be present.  Patient was also advised to do warm salt or absence salt soaks of her toe several times throughout the day if at all possible.  Patient will also be prescribed Keflex antibiotics for treatment of the infection as well.  She was encouraged to call/return or go directly to the emergency department with any worsening   No history exists.    Review of Systems  Musculoskeletal:       Complaint of right great toe ingrown.  All other systems reviewed and are negative.   Past Medical History  Diagnosis Date  . History of radiation therapy 05/02/11  to 06/08/11    right lung  . Lung cancer ([place])     right lower lobe adenocarcinoma  . Encounter for antineoplastic chemotherapy 01/19/2016    Past Surgical History  Procedure Laterality Date  . Lung lobectomy  04/18/2009    RLL  . Portacath placement  02/29/2012    Procedure: INSERTION PORT-A-CATH;  Surgeon: Nicanor Alcon, MD;  Location: [facility];  Service: Thoracic;  Laterality: Left;  PowerPort 8 F attachable in left internal jugular.    has Primary cancer of right lower lobe of lung ([place]); LEIOMYOMA, UTERUS; GERD (gastroesophageal reflux disease); and Ingrown right big toenail on her problem list.    is allergic to codeine and doxycycline.    Medication List       This list is accurate as of: 06/06/16 12:23 PM.  Always use your most recent med list.               albuterol 108 (90 Base) MCG/ACT inhaler  Commonly known as:  PROVENTIL HFA;VENTOLIN HFA  Inhale 2 puffs into the lungs every 6 (six) hours as needed for wheezing.     cephALEXin 500 MG capsule  Commonly known as:  KEFLEX  Take 1 capsule (500 mg total) by mouth 4 (four) times daily.     chlorpheniramine-HYDROcodone 10-8 MG/5ML Suer  Commonly known as:  TUSSIONEX  Take 5 mLs by mouth every 12 (twelve) hours as needed  for cough.     clindamycin 1 % lotion  Commonly known as:  CLEOCIN T  Apply topically 2 (two) times daily.     erlotinib 150 MG tablet  Commonly known as:  TARCEVA  Take 1 tablet (150 mg total) by mouth daily.     lidocaine-prilocaine cream  Commonly known as:  EMLA  Apply to Port-A-Cath prior to tx PRN     omeprazole 20 MG capsule  Commonly known as:  PRILOSEC  Take 1 capsule (20 mg total) by mouth daily.     prochlorperazine 10 MG tablet  Commonly known as:  COMPAZINE  Take 1 tablet (10 mg total) by mouth every 6 (six) hours as needed for nausea or vomiting.         PHYSICAL EXAMINATION  Oncology Vitals 06/06/2016 05/16/2016  Height 152 cm 152 cm  Weight 58.469 kg 58.832 kg  Weight  (lbs) 128 lbs 14 oz 129 lbs 11 oz  BMI (kg/m2) 25.17 kg/m2 25.33 kg/m2  Temp 98.3 98.4  Pulse 94 90  Resp 17 18  SpO2 98 100  BSA (m2) 1.57 m2 1.58 m2   BP Readings from Last 2 Encounters:  06/06/16 117/77  05/16/16 124/82    Physical Exam  Constitutional: She is oriented to person, place, and time and well-developed, well-nourished, and in no distress.  HENT:  Head: Normocephalic and atraumatic.  Eyes: Conjunctivae and EOM are normal. Pupils are equal, round, and reactive to light. Right eye exhibits no discharge. Left eye exhibits no discharge. No scleral icterus.  Neck: Normal range of motion.  Pulmonary/Chest: Effort normal. No respiratory distress.  Musculoskeletal: Normal range of motion. She exhibits no edema or tenderness.  Exam today reveals that the right great toe is edematous and mildly tender with palpation to the right corner of the nail itself.  It does appear that the toenail has been clipped to the deep right corner of the nail; and it does appear that there is some dried drainage at that site as well.  The air is no erythema, warmth, or red streaks to the site.  Also, there is no edema to the foot itself.     Neurological: She is alert and oriented to person, place, and time. Gait normal.  Skin: Skin is warm and dry. No rash noted. No erythema. No pallor.  See toe note.   Psychiatric: Affect normal.  Nursing note and vitals reviewed.   LABORATORY DATA:. Appointment on 06/06/2016  Component Date Value Ref Range Status  . WBC 06/06/2016 7.5  3.9 - 10.3 10e3/uL Final  . NEUT# 06/06/2016 5.7  1.5 - 6.5 10e3/uL Final  . HGB 06/06/2016 12.7  11.6 - 15.9 g/dL Final  . HCT 06/06/2016 38.8  34.8 - 46.6 % Final  . Platelets 06/06/2016 275  145 - 400 10e3/uL Final  . MCV 06/06/2016 85.8  79.5 - 101.0 fL Final  . MCH 06/06/2016 28.1  25.1 - 34.0 pg Final  . MCHC 06/06/2016 32.8  31.5 - 36.0 g/dL Final  . RBC 06/06/2016 4.52  3.70 - 5.45 10e6/uL Final  . RDW  06/06/2016 13.7  11.2 - 14.5 % Final  . lymph# 06/06/2016 1.3  0.9 - 3.3 10e3/uL Final  . MONO# 06/06/2016 0.4  0.1 - 0.9 10e3/uL Final  . Eosinophils Absolute 06/06/2016 0.0  0.0 - 0.5 10e3/uL Final  . Basophils Absolute 06/06/2016 0.0  0.0 - 0.1 10e3/uL Final  . NEUT% 06/06/2016 75.6  38.4 - 76.8 % Final  .  LYMPH% 06/06/2016 17.7  14.0 - 49.7 % Final  . MONO% 06/06/2016 5.7  0.0 - 14.0 % Final  . EOS% 06/06/2016 0.5  0.0 - 7.0 % Final  . BASO% 06/06/2016 0.5  0.0 - 2.0 % Final  . Sodium 06/06/2016 141  136 - 145 mEq/L Final  . Potassium 06/06/2016 4.1  3.5 - 5.1 mEq/L Final  . Chloride 06/06/2016 106  98 - 109 mEq/L Final  . CO2 06/06/2016 26  22 - 29 mEq/L Final  . Glucose 06/06/2016 107  70 - 140 mg/dl Final   Glucose reference range is for nonfasting patients. Fasting glucose reference range is 70- 100.  Marland Kitchen BUN 06/06/2016 11.6  7.0 - 26.0 mg/dL Final  . Creatinine 06/06/2016 0.6  0.6 - 1.1 mg/dL Final  . Total Bilirubin 06/06/2016 0.50  0.20 - 1.20 mg/dL Final  . Alkaline Phosphatase 06/06/2016 83  40 - 150 U/L Final  . AST 06/06/2016 36* 5 - 34 U/L Final  . ALT 06/06/2016 36  0 - 55 U/L Final  . Total Protein 06/06/2016 7.1  6.4 - 8.3 g/dL Final  . Albumin 06/06/2016 3.6  3.5 - 5.0 g/dL Final  . Calcium 06/06/2016 9.0  8.4 - 10.4 mg/dL Final  . Anion Gap 06/06/2016 9  3 - 11 mEq/L Final  . EGFR 06/06/2016 >90  >90 ml/min/1.73 m2 Final   eGFR is calculated using the CKD-EPI Creatinine Equation (2009)       RADIOGRAPHIC STUDIES: No results found.  ASSESSMENT/PLAN:    Primary cancer of right lower lobe of lung [facility]) Patient continues to take her oral Tarceva chemotherapy as directed.  Labs obtained today were all within normal limits.  Patient is scheduled to return for a follow-up visit only with Dr. Julien Nordmann on 06/13/2016.  Ingrown right big toenail Patient presents to the [facility] today with complaint of a right big toe ingrown toenail.  She states that he has  been bothering her intermittently for the last 2 months.  She states that it occasionally drains from the right corner of the toenail.  She denies any recent fevers or chills.  Exam today reveals that the right great toe is edematous and mildly tender with palpation to the right corner of the nail itself.  It does appear that the toenail has been clipped to the deep right corner of the nail; and it does appear that there is some dried drainage at that site as well.  The air is no erythema, warmth, or red streaks to the site.  Also, there is no edema to the foot itself.  Reviewed all findings with Dr. Julien Nordmann; he recommended patient be referred to a podiatrist.  Was able to arrange for patient to see a podiatrist, Dr[facility] [place] on 06/22/2016 at 9:30 in the morning.  Advised patient tells very important that patient go to the podiatrist appointment; even if her toe is looking better or resolved following treatment with antibiotics.  Also, will need a translator at this visit.  This provider has requested a translator be present.  Patient was also advised to do warm salt or absence salt soaks of her toe several times throughout the day if at all possible.  Patient will also be prescribed Keflex antibiotics for treatment of the infection as well.  She was encouraged to call/return or go directly to the emergency department with any worsening symptoms whatsoever.   Patient stated understanding of all instructions; and was  in agreement with this plan of care. The patient knows to call the clinic with any problems, questions or concerns.   Total time spent with patient was 25 minutes;  with greater than 75 percent of that time spent in face to face counseling regarding patient's symptoms,  and coordination of care and follow up.  Disclaimer:This dictation was prepared with Dragon/digital dictation along with [employer]. Any transcriptional errors that result  from this process are unintentional.  Drue Second, NP 06/06/2016

## 2016-06-06 NOTE — Telephone Encounter (Signed)
per pfo to sch pt appt-gave pt copy of avs-pt here-sent [facility] staff message to [place] [facility]

## 2016-06-06 NOTE — Assessment & Plan Note (Signed)
Patient presents to the [facility] today with complaint of a right big toe ingrown toenail.  She states that he has been bothering her intermittently for the last 2 months.  She states that it occasionally drains from the right corner of the toenail.  She denies any recent fevers or chills.  Exam today reveals that the right great toe is edematous and mildly tender with palpation to the right corner of the nail itself.  It does appear that the toenail has been clipped to the deep right corner of the nail; and it does appear that there is some dried drainage at that site as well.  The air is no erythema, warmth, or red streaks to the site.  Also, there is no edema to the foot itself.  Reviewed all findings with Dr. Julien Nordmann; he recommended patient be referred to a podiatrist.  Was able to arrange for patient to see a podiatrist, Dr[facility] Paulla [facility] [place] on 06/22/2016 at 9:30 in the morning.  Advised patient tells very important that patient go to the podiatrist appointment; even if her toe is looking better or resolved following treatment with antibiotics.  Also, will need a translator at this visit.  This provider has requested a translator be present.  Patient was also advised to do warm salt or absence salt soaks of her toe several times throughout the day if at all possible.  Patient will also be prescribed Keflex antibiotics for treatment of the infection as well.  She was encouraged to call/return or go directly to the emergency department with any worsening symptoms whatsoever.

## 2016-06-06 NOTE — Assessment & Plan Note (Signed)
Patient continues to take her oral Tarceva chemotherapy as directed.  Labs obtained today were all within normal limits.  Patient is scheduled to return for a follow-up visit only with Dr. Julien Nordmann on 06/13/2016.

## 2016-06-06 NOTE — Telephone Encounter (Signed)
Pt walked in w/ c/o Right big toe swollen. [facility] receptionist in lab visualized swollen toe. Per Dr Julien Nordmann pt to see [facility].

## 2016-06-07 ENCOUNTER — Ambulatory Visit (HOSPITAL_COMMUNITY)
Admission: RE | Admit: 2016-06-07 | Discharge: 2016-06-07 | Disposition: A | Discharge status: Home / Self Care | Payer: PRIVATE HEALTH INSURANCE | Source: Ambulatory Visit | Attending: Internal Medicine | Admitting: Internal Medicine

## 2016-06-07 ENCOUNTER — Encounter (HOSPITAL_COMMUNITY): Payer: Self-pay

## 2016-06-07 DIAGNOSIS — K760 Fatty (change of) liver, not elsewhere classified: Secondary | ICD-10-CM | POA: Insufficient documentation

## 2016-06-07 DIAGNOSIS — Z5111 Encounter for antineoplastic chemotherapy: Secondary | ICD-10-CM

## 2016-06-07 DIAGNOSIS — J90 Pleural effusion, not elsewhere classified: Secondary | ICD-10-CM | POA: Insufficient documentation

## 2016-06-07 DIAGNOSIS — C3490 Malignant neoplasm of unspecified part of unspecified bronchus or lung: Secondary | ICD-10-CM | POA: Diagnosis not present

## 2016-06-07 DIAGNOSIS — C3431 Malignant neoplasm of lower lobe, right bronchus or lung: Secondary | ICD-10-CM | POA: Insufficient documentation

## 2016-06-07 DIAGNOSIS — Z9221 Personal history of antineoplastic chemotherapy: Secondary | ICD-10-CM | POA: Insufficient documentation

## 2016-06-07 MED ORDER — IOPAMIDOL (ISOVUE-300) INJECTION 61%
75.0000 mL | Freq: Once | INTRAVENOUS | Status: AC | PRN
  Administered 2016-06-07: 75 mL via INTRAVENOUS

## 2016-06-13 ENCOUNTER — Ambulatory Visit (HOSPITAL_BASED_OUTPATIENT_CLINIC_OR_DEPARTMENT_OTHER): Payer: PRIVATE HEALTH INSURANCE | Admitting: Internal Medicine

## 2016-06-13 ENCOUNTER — Encounter: Payer: Self-pay | Admitting: *Deleted

## 2016-06-13 ENCOUNTER — Telehealth: Payer: Self-pay | Admitting: Internal Medicine

## 2016-06-13 ENCOUNTER — Encounter: Payer: Self-pay | Admitting: Internal Medicine

## 2016-06-13 VITALS — BP 115/84 | HR 90 | Temp 98.3°F | Resp 18 | Ht 60.0 in | Wt 129.3 lb

## 2016-06-13 DIAGNOSIS — L853 Xerosis cutis: Secondary | ICD-10-CM | POA: Diagnosis not present

## 2016-06-13 DIAGNOSIS — C3431 Malignant neoplasm of lower lobe, right bronchus or lung: Secondary | ICD-10-CM

## 2016-06-13 DIAGNOSIS — R062 Wheezing: Secondary | ICD-10-CM | POA: Diagnosis not present

## 2016-06-13 DIAGNOSIS — R05 Cough: Secondary | ICD-10-CM

## 2016-06-13 DIAGNOSIS — Z5111 Encounter for antineoplastic chemotherapy: Secondary | ICD-10-CM

## 2016-06-13 NOTE — Progress Notes (Signed)
[name]:(336) [phone]   Fax:(336) [phone]  OFFICE PROGRESS NOTE  [name]., MD [name] [id number]  PRINCIPAL DIAGNOSIS: Local recurrence of non-small cell lung cancer, adenocarcinoma, initially diagnosed as stage IB (T2a N0 M0) in March of 2010.   PRIOR THERAPY:  1. Status post right lower lobectomy under the care of Dr. Cyndia Bent on 04/08/2009. 2. Status post palliative radiotherapy to the right lower lobe recurrent lung mass under the care of Dr. Lisbeth Renshaw, completed on June 08, 2011. 3. Systemic chemotherapy with carboplatin for AUC of 5 and Alimta 500 mg/M2 every 3 weeks. She is status post 3 cycles.  CURRENT THERAPY: Tarceva 150 mg by mouth daily, therapy beginning 07/24/2012. Status post approximately 40 months of therapy.   CHEMOTHERAPY INTENT: Palliative  CURRENT # OF CHEMOTHERAPY CYCLES: 41 CURRENT ANTIEMETICS: Compazine  CURRENT SMOKING STATUS: Nonsmoker  ORAL CHEMOTHERAPY AND CONSENT: Tarceva  CURRENT BISPHOSPHONATES USE: None  PAIN MANAGEMENT: None  NARCOTICS INDUCED CONSTIPATION: None  LIVING WILL AND CODE STATUS: Full code  INTERVAL HISTORY: [name] 55 y.o. female returns to the clinic today for follow up visit accompanied by her son. The patient is feeling fine today with no specific complaints except for the persistent dry cough and she is currently on Tussionex. She was treated recently for ingrown toenail. She continues to have skin rash involving mainly the right eyebrow. She has occasional episodes of diarrhea. She is tolerating her treatment with Tarceva fairly well. She had repeat CT scan of the chest performed recently and she is here for evaluation and discussion of her scan results.  MEDICAL HISTORY: Past Medical History  Diagnosis Date  . History of radiation therapy 05/02/11 to 06/08/11    right lung  . Lung cancer ([place])     right lower lobe adenocarcinoma  . Encounter for antineoplastic  chemotherapy 01/19/2016    ALLERGIES:  is allergic to codeine and doxycycline.  MEDICATIONS:  Current Outpatient Prescriptions  Medication Sig Dispense Refill  . albuterol (PROVENTIL HFA;VENTOLIN HFA) 108 (90 Base) MCG/ACT inhaler Inhale 2 puffs into the lungs every 6 (six) hours as needed for wheezing. 1 Inhaler 1  . cephALEXin (KEFLEX) 500 MG capsule Take 1 capsule (500 mg total) by mouth 4 (four) times daily. 40 capsule 0  . chlorpheniramine-HYDROcodone (TUSSIONEX) 10-8 MG/5ML SUER Take 5 mLs by mouth every 12 (twelve) hours as needed for cough. 140 mL 0  . clindamycin (CLEOCIN T) 1 % lotion Apply topically 2 (two) times daily. (Patient taking differently: Apply topically 2 (two) times daily. ) 60 mL 0  . erlotinib (TARCEVA) 150 MG tablet Take 1 tablet (150 mg total) by mouth daily. 30 tablet 2  . lidocaine-prilocaine (EMLA) cream Apply to Port-A-Cath prior to tx PRN 5 g prn 1 year  . omeprazole (PRILOSEC) 20 MG capsule Take 1 capsule (20 mg total) by mouth daily. 30 capsule 1  . prochlorperazine (COMPAZINE) 10 MG tablet Take 1 tablet (10 mg total) by mouth every 6 (six) hours as needed for nausea or vomiting. 30 tablet 0   No current facility-administered medications for this visit.    SURGICAL HISTORY:  Past Surgical History  Procedure Laterality Date  . Lung lobectomy  04/18/2009    RLL  . Portacath placement  02/29/2012    Procedure: INSERTION PORT-A-CATH;  Surgeon: Nicanor Alcon, MD;  Location: [place];  Service: Thoracic;  Laterality: Left;  PowerPort 8 F attachable in left internal  jugular.    REVIEW OF SYSTEMS:  Constitutional: negative Eyes: negative Ears, nose, mouth, throat, and face: negative Respiratory: positive for cough Cardiovascular: negative Gastrointestinal: negative Genitourinary:negative Integument/breast: negative Hematologic/lymphatic: negative Musculoskeletal:negative Neurological: negative Behavioral/Psych: negative Endocrine:  negative Allergic/Immunologic: negative   PHYSICAL EXAMINATION: General appearance: alert, cooperative and no distress Head: Normocephalic, without obvious abnormality, atraumatic Neck: no adenopathy, no JVD, supple, symmetrical, trachea midline and thyroid not enlarged, symmetric, no tenderness/mass/nodules Lymph nodes: Cervical, supraclavicular, and axillary nodes normal. Resp: wheezes bilaterally Back: symmetric, no curvature. ROM normal. No CVA tenderness. Cardio: regular rate and rhythm, S1, S2 normal, no murmur, click, rub or gallop GI: soft, non-tender; bowel sounds normal; no masses,  no organomegaly Extremities: extremities normal, atraumatic, no cyanosis or edema Neurologic: Alert and oriented X 3, normal strength and tone. Normal symmetric reflexes. Normal coordination and gait  ECOG PERFORMANCE STATUS: 1 - Symptomatic but completely ambulatory  Blood pressure 115/84, pulse 90, temperature 98.3 F (36.8 C), temperature source Oral, resp. rate 18, height 5' (1.524 m), weight 129 lb 4.8 oz (58.65 kg), last menstrual period 02/10/2012, SpO2 98 %.  LABORATORY DATA: Lab Results  Component Value Date   WBC 7.5 06/06/2016   HGB 12.7 06/06/2016   HCT 38.8 06/06/2016   MCV 85.8 06/06/2016   PLT 275 06/06/2016      Chemistry      Component Value Date/Time   NA 141 06/06/2016 1027   NA 140 09/09/2014 0410   NA 141 01/06/2012 0804   K 4.1 06/06/2016 1027   K 3.9 09/09/2014 0410   K 4.6 01/06/2012 0804   CL 102 09/09/2014 0410   CL 102 04/30/2013 1002   CL 98 01/06/2012 0804   CO2 26 06/06/2016 1027   CO2 28 09/09/2014 0410   CO2 28 01/06/2012 0804   BUN 11.6 06/06/2016 1027   BUN 8 09/09/2014 0410   BUN 10 01/06/2012 0804   CREATININE 0.6 06/06/2016 1027   CREATININE 0.46* 09/09/2014 0410   CREATININE 0.6 01/06/2012 0804      Component Value Date/Time   CALCIUM 9.0 06/06/2016 1027   CALCIUM 8.7 09/09/2014 0410   CALCIUM 8.9 01/06/2012 0804   ALKPHOS 83 06/06/2016  1027   ALKPHOS 93 07/12/2012 1121   ALKPHOS 64 01/06/2012 0804   AST 36* 06/06/2016 1027   AST 44* 07/12/2012 1121   AST 27 01/06/2012 0804   ALT 36 06/06/2016 1027   ALT 49* 07/12/2012 1121   ALT 24 01/06/2012 0804   BILITOT 0.50 06/06/2016 1027   BILITOT 0.3 07/12/2012 1121   BILITOT 0.60 01/06/2012 0804       RADIOGRAPHIC STUDIES: Ct Chest W Contrast  06/07/2016  CLINICAL DATA:  55 year old bold female with history of lung cancer status post right lower lobectomy and radiation therapy (completed). Chemotherapy in progress. Restaging examination. EXAM: CT CHEST WITH CONTRAST TECHNIQUE: Multidetector CT imaging of the chest was performed during intravenous contrast administration. CONTRAST:  30m ISOVUE-300 IOPAMIDOL (ISOVUE-300) INJECTION 61% COMPARISON:  Chest CT 02/15/2016. FINDINGS: Cardiovascular: Heart size is normal. There is no significant pericardial fluid, thickening or pericardial calcification. Left-sided internal jugular single-lumen porta cath with tip terminating in the right atrium. Mediastinum/Nodes: No pathologically enlarged mediastinal or hilar lymph nodes. Esophagus is unremarkable in appearance. No axillary lymphadenopathy. Lungs/Pleura: Postoperative changes of right lower lobectomy are again noted. In the perihilar aspects of the right upper and middle lobes there is persistent mass-like architectural distortion which is very similar to prior examinations, most compatible with chronic  mass-like fibrosis. There is a suture line in the posterior aspect of the right upper lobe, presumably from prior wedge resection. Some linear and nodular thickening along this suture line extending toward the right apex is very similar to the prior study, again favored to reflect chronic scarring. The previously noted subsolid nodule in the right middle lobe is no longer confidently identified, and is likely obscured by a greater degree of volume loss noted in the right middle lobe on today's  examination when compared to the prior study (this is best appreciated when viewing sagittal reconstructions). No definite new suspicious appearing pulmonary nodules or masses are noted. Chronic small to moderate right pleural effusion with some chronic pleural thickening and enhancement, similar to the prior examination. Upper Abdomen: Diffuse low attenuation throughout the hepatic parenchyma, compatible with hepatic steatosis. Bilateral adrenal glands are well visualized and are normal in appearance. Musculoskeletal: There are no aggressive appearing lytic or blastic lesions noted in the visualized portions of the skeleton. Postoperative changes of right-sided thoracotomy are again noted. IMPRESSION: 1. Overall, today's study appears very similar to the prior examination, with exception of increased volume loss in the right middle lobe. This volume loss is now obscuring the previously noted right middle lobe subsolid nodule. No definite new nodules or lymphadenopathy identified on today's examination. Chronic small to moderate enhancing right pleural effusion is unchanged. 2. Hepatic steatosis. Electronically Signed   By: Vinnie Langton M.D.   On: 06/07/2016 17:43    ASSESSMENT AND PLAN: This is a very pleasant 55 years old Asian female with recurrent non-small cell lung cancer, adenocarcinoma. She is currently on treatment with oral Tarceva 150 mg by mouth daily status post 40 months of treatment. She is tolerating her treatment with Tarceva fairly well.  The recent CT scan of the chest showed no evidence for disease progression. I discussed the scan results with the patient and her son. I recommended for her to continue her current treatment with Tarceva. For the chest wheezing, I advised the patient to continue with her inhaler every 6 hours as needed. For the cough, she will continue on Tussionex.  For the dry skin, the patient was advised to use lotions.  I will see the patient back for follow-up  visit in one months with repeat blood work. She was advised to call immediately if she has any concerning symptoms in the interval. The patient voices understanding of current disease status and treatment options and is in agreement with the current care plan.  All questions were answered. The patient knows to call the clinic with any problems, questions or concerns. We can certainly see the patient much sooner if necessary.  Disclaimer: This note was dictated with voice recognition software. Similar sounding words can inadvertently be transcribed and may not be corrected upon review.

## 2016-06-13 NOTE — Telephone Encounter (Signed)
per pof to sch pt appt-gave pt copy of avs °

## 2016-06-13 NOTE — Progress Notes (Signed)
Oncology Nurse Navigator Documentation  Oncology Nurse Navigator Flowsheets 06/13/2016  Patient Visit Type MedOnc  Treatment Phase Treatment  Barriers/Navigation Needs No barriers at this time  Interventions None required  Acuity Level 1  Time Spent with Patient 15

## 2016-06-15 ENCOUNTER — Encounter: Payer: Self-pay | Admitting: *Deleted

## 2016-06-15 ENCOUNTER — Other Ambulatory Visit: Payer: Self-pay | Admitting: Radiation Oncology

## 2016-06-15 ENCOUNTER — Other Ambulatory Visit: Payer: Self-pay | Admitting: *Deleted

## 2016-06-15 DIAGNOSIS — C3431 Malignant neoplasm of lower lobe, right bronchus or lung: Secondary | ICD-10-CM

## 2016-06-19 ENCOUNTER — Other Ambulatory Visit: Payer: Self-pay | Admitting: Oncology

## 2016-06-22 ENCOUNTER — Ambulatory Visit: Payer: PRIVATE HEALTH INSURANCE | Admitting: Podiatry

## 2016-06-23 ENCOUNTER — Encounter: Payer: Self-pay | Admitting: Podiatry

## 2016-06-23 ENCOUNTER — Ambulatory Visit (INDEPENDENT_AMBULATORY_CARE_PROVIDER_SITE_OTHER): Payer: Medicare Other | Admitting: Podiatry

## 2016-06-23 VITALS — BP 122/88 | HR 74

## 2016-06-23 DIAGNOSIS — L600 Ingrowing nail: Secondary | ICD-10-CM | POA: Diagnosis not present

## 2016-06-23 NOTE — Progress Notes (Signed)
   Subjective:    Patient ID: [name], female    DOB: 09/02/1961, 55 y.o.   MRN: [id number]  HPI    Review of Systems  All other systems reviewed and are negative.      Objective:   Physical Exam        Assessment & Plan:

## 2016-06-23 NOTE — Patient Instructions (Signed)

## 2016-06-27 NOTE — Progress Notes (Signed)
Subjective:     Patient ID: [name], female   DOB: [birth year], 55 y.o.   MRN: [id number]  HPI patient presents with an ingrown toenail the right big toe medial lateral borders that are painful and she cannot cut. She has had previous chemotherapy for cancer but right now has been doing stable and presents with caregiver   Review of Systems  All other systems reviewed and are negative.      Objective:   Physical Exam  Constitutional: She is oriented to person, place, and time.  Cardiovascular: Intact distal pulses.   Musculoskeletal: Normal range of motion.  Neurological: She is oriented to person, place, and time.  Skin: Skin is warm.  Nursing note and vitals reviewed.  Neurovascular status intact muscle strength adequate range of motion within normal limits with patient found to have incurvated right hallux nail with distal irritation medial lateral border that's moderately painful when pressed with no active drainage or proximal edema erythema drainage noted. Patient's found have good digital perfusion and is well oriented 3     Assessment:     Ingrown toenail deformity right hallux distal medial lateral border    Plan:     H&P condition reviewed and recommended removal of the nail corners. Explained procedure and risk and today I infiltrated the right hallux 60 mg Xylocaine Marcaine mixture removed the medial and lateral borders exposed matrix and applied phenol 3 applications 30 seconds followed by alcohol lavage and sterile dressing. Gave instructions on soaks and reappoint

## 2016-07-11 ENCOUNTER — Encounter: Payer: Self-pay | Admitting: Internal Medicine

## 2016-07-11 ENCOUNTER — Other Ambulatory Visit (HOSPITAL_BASED_OUTPATIENT_CLINIC_OR_DEPARTMENT_OTHER): Payer: PRIVATE HEALTH INSURANCE

## 2016-07-11 ENCOUNTER — Ambulatory Visit (HOSPITAL_BASED_OUTPATIENT_CLINIC_OR_DEPARTMENT_OTHER): Payer: PRIVATE HEALTH INSURANCE | Admitting: Internal Medicine

## 2016-07-11 ENCOUNTER — Telehealth: Payer: Self-pay | Admitting: Internal Medicine

## 2016-07-11 VITALS — BP 113/78 | HR 94 | Temp 98.7°F | Resp 18 | Ht 60.0 in | Wt 126.3 lb

## 2016-07-11 DIAGNOSIS — R05 Cough: Secondary | ICD-10-CM | POA: Diagnosis not present

## 2016-07-11 DIAGNOSIS — C3431 Malignant neoplasm of lower lobe, right bronchus or lung: Secondary | ICD-10-CM

## 2016-07-11 DIAGNOSIS — R197 Diarrhea, unspecified: Secondary | ICD-10-CM

## 2016-07-11 DIAGNOSIS — Z5111 Encounter for antineoplastic chemotherapy: Secondary | ICD-10-CM

## 2016-07-11 LAB — CBC WITH DIFFERENTIAL/PLATELET
BASO%: 0.3 % (ref 0.0–2.0)
Basophils Absolute: 0 10*3/uL (ref 0.0–0.1)
EOS%: 0.8 % (ref 0.0–7.0)
Eosinophils Absolute: 0.1 10*3/uL (ref 0.0–0.5)
HCT: 41.7 % (ref 34.8–46.6)
HGB: 13.5 g/dL (ref 11.6–15.9)
LYMPH%: 17.2 % (ref 14.0–49.7)
MCH: 27.6 pg (ref 25.1–34.0)
MCHC: 32.5 g/dL (ref 31.5–36.0)
MCV: 85.1 fL (ref 79.5–101.0)
MONO#: 0.6 10*3/uL (ref 0.1–0.9)
MONO%: 7.6 % (ref 0.0–14.0)
NEUT#: 6 10*3/uL (ref 1.5–6.5)
NEUT%: 74.1 % (ref 38.4–76.8)
Platelets: 307 10*3/uL (ref 145–400)
RBC: 4.89 10*6/uL (ref 3.70–5.45)
RDW: 13.8 % (ref 11.2–14.5)
WBC: 8.1 10*3/uL (ref 3.9–10.3)
lymph#: 1.4 10*3/uL (ref 0.9–3.3)

## 2016-07-11 LAB — COMPREHENSIVE METABOLIC PANEL
ALT: 25 U/L (ref 0–55)
AST: 27 U/L (ref 5–34)
Albumin: 3.9 g/dL (ref 3.5–5.0)
Alkaline Phosphatase: 98 U/L (ref 40–150)
Anion Gap: 9 mEq/L (ref 3–11)
BUN: 16.5 mg/dL (ref 7.0–26.0)
CO2: 28 mEq/L (ref 22–29)
Calcium: 9.7 mg/dL (ref 8.4–10.4)
Chloride: 103 mEq/L (ref 98–109)
Creatinine: 0.7 mg/dL (ref 0.6–1.1)
EGFR: >90 mL/min/{1.73_m2} (ref >90)
Glucose: 109 mg/dl (ref 70–140)
Potassium: 4.6 mEq/L (ref 3.5–5.1)
Sodium: 140 mEq/L (ref 136–145)
Total Bilirubin: 0.58 mg/dL (ref 0.20–1.20)
Total Protein: 7.8 g/dL (ref 6.4–8.3)

## 2016-07-11 MED ORDER — DOXYCYCLINE HYCLATE 100 MG PO TABS: 100.0000 mg | ORAL_TABLET | Freq: Two times a day (BID) | ORAL | 0 refills | Status: DC

## 2016-07-11 NOTE — Progress Notes (Signed)
[name]:(336) [phone]   Fax:(336) [phone]  OFFICE PROGRESS NOTE  Kathryn Kempf., MD [place] [address]  PRINCIPAL DIAGNOSIS: Local recurrence of non-small cell lung cancer, adenocarcinoma, initially diagnosed as stage IB (T2a N0 M0) in March of 2010.   PRIOR THERAPY:  1. Status post right lower lobectomy under the care of Dr. Cyndia Bent on 04/08/2009. 2. Status post palliative radiotherapy to the right lower lobe recurrent lung mass under the care of Dr. Lisbeth Renshaw, completed on June 08, 2011. 3. Systemic chemotherapy with carboplatin for AUC of 5 and Alimta 500 mg/M2 every 3 weeks. She is status post 3 cycles.  CURRENT THERAPY: Tarceva 150 mg by mouth daily, therapy beginning 07/24/2012. Status post approximately 41 months of therapy.   CHEMOTHERAPY INTENT: Palliative  CURRENT # OF CHEMOTHERAPY CYCLES: 42 CURRENT ANTIEMETICS: Compazine  CURRENT SMOKING STATUS: Nonsmoker  ORAL CHEMOTHERAPY AND CONSENT: Tarceva  CURRENT BISPHOSPHONATES USE: None  PAIN MANAGEMENT: None  NARCOTICS INDUCED CONSTIPATION: None  LIVING WILL AND CODE STATUS: Full code  INTERVAL HISTORY: [name] 55 y.o. female returns to the clinic today for follow up visit accompanied by her interpreter. The patient is feeling fine today with no specific complaints except for the persistent dry cough and she is currently on Tussionex and occasional shortness breath with exertion. She continues to have skin rash involving mainly the right eyebrow. She has occasional episodes of diarrhea. She is tolerating her treatment with Tarceva fairly well. She had repeat CBC and compliance metabolic panel performed earlier today and she is here for evaluation and discussion of her lab results.  MEDICAL HISTORY: Past Medical History:  Diagnosis Date  . Encounter for antineoplastic chemotherapy 01/19/2016  . History of radiation therapy 05/02/11 to 06/08/11   right lung  . Lung cancer  ([place])    right lower lobe adenocarcinoma    ALLERGIES:  is allergic to codeine and doxycycline.  MEDICATIONS:  Current Outpatient Prescriptions  Medication Sig Dispense Refill  . albuterol (PROVENTIL HFA;VENTOLIN HFA) 108 (90 Base) MCG/ACT inhaler Inhale 2 puffs into the lungs every 6 (six) hours as needed for wheezing. 1 Inhaler 1  . cephALEXin (KEFLEX) 500 MG capsule Take 1 capsule (500 mg total) by mouth 4 (four) times daily. 40 capsule 0  . chlorpheniramine-HYDROcodone (TUSSIONEX) 10-8 MG/5ML SUER Take 5 mLs by mouth every 12 (twelve) hours as needed for cough. 140 mL 0  . clindamycin (CLEOCIN T) 1 % lotion Apply topically 2 (two) times daily. (Patient taking differently: Apply topically 2 (two) times daily. ) 60 mL 0  . erlotinib (TARCEVA) 150 MG tablet Take 1 tablet (150 mg total) by mouth daily. 30 tablet 2  . lidocaine-prilocaine (EMLA) cream Apply to Port-A-Cath prior to tx PRN 5 g prn 1 year  . omeprazole (PRILOSEC) 20 MG capsule Take 1 capsule (20 mg total) by mouth daily. 30 capsule 1  . prochlorperazine (COMPAZINE) 10 MG tablet Take 1 tablet (10 mg total) by mouth every 6 (six) hours as needed for nausea or vomiting. 30 tablet 0   No current facility-administered medications for this visit.     SURGICAL HISTORY:  Past Surgical History:  Procedure Laterality Date  . LUNG LOBECTOMY  04/18/2009   RLL  . PORTACATH PLACEMENT  02/29/2012   Procedure: INSERTION PORT-A-CATH;  Surgeon: Nicanor Alcon, MD;  Location: [place];  Service: Thoracic;  Laterality: Left;  PowerPort 8 F attachable in left internal jugular.  REVIEW OF SYSTEMS:  A comprehensive review of systems was negative except for: Constitutional: positive for fatigue Respiratory: positive for dyspnea on exertion Integument/breast: positive for rash   PHYSICAL EXAMINATION: General appearance: alert, cooperative and no distress Head: Normocephalic, without obvious abnormality, atraumatic Neck: no adenopathy, no JVD,  supple, symmetrical, trachea midline and thyroid not enlarged, symmetric, no tenderness/mass/nodules Lymph nodes: Cervical, supraclavicular, and axillary nodes normal. Resp: wheezes bilaterally Back: symmetric, no curvature. ROM normal. No CVA tenderness. Cardio: regular rate and rhythm, S1, S2 normal, no murmur, click, rub or gallop GI: soft, non-tender; bowel sounds normal; no masses,  no organomegaly Extremities: extremities normal, atraumatic, no cyanosis or edema Neurologic: Alert and oriented X 3, normal strength and tone. Normal symmetric reflexes. Normal coordination and gait  ECOG PERFORMANCE STATUS: 1 - Symptomatic but completely ambulatory  Blood pressure 113/78, pulse 94, temperature 98.7 F (37.1 C), temperature source Oral, resp. rate 18, height 5' (1.524 m), weight 126 lb 4.8 oz (57.3 kg), last menstrual period 02/10/2012, SpO2 99 %.  LABORATORY DATA: Lab Results  Component Value Date   WBC 8.1 07/11/2016   HGB 13.5 07/11/2016   HCT 41.7 07/11/2016   MCV 85.1 07/11/2016   PLT 307 07/11/2016      Chemistry      Component Value Date/Time   NA 141 06/06/2016 1027   K 4.1 06/06/2016 1027   CL 102 09/09/2014 0410   CL 102 04/30/2013 1002   CO2 26 06/06/2016 1027   BUN 11.6 06/06/2016 1027   CREATININE 0.6 06/06/2016 1027      Component Value Date/Time   CALCIUM 9.0 06/06/2016 1027   ALKPHOS 83 06/06/2016 1027   AST 36 (H) 06/06/2016 1027   ALT 36 06/06/2016 1027   BILITOT 0.50 06/06/2016 1027       RADIOGRAPHIC STUDIES: No results found.  ASSESSMENT AND PLAN: This is a very pleasant 55 years old Asian female with recurrent non-small cell lung cancer, adenocarcinoma. She is currently on treatment with oral Tarceva 150 mg by mouth daily status post 40 months of treatment. She is tolerating her treatment with Tarceva fairly well.  I recommended for her to continue her current treatment with Tarceva. For the chest wheezing, I advised the patient to continue  with her inhaler every 6 hours as needed. For the cough, she will continue on Tussionex.  For the dry skin, the patient was advised to use lotions. I will also give her a refill of doxycycline as needed for the eyelid rash. I will see the patient back for follow-up visit in one month with repeat blood work. She was advised to call immediately if she has any concerning symptoms in the interval. The patient voices understanding of current disease status and treatment options and is in agreement with the current care plan.  All questions were answered. The patient knows to call the clinic with any problems, questions or concerns. We can certainly see the patient much sooner if necessary.  Disclaimer: This note was dictated with voice recognition software. Similar sounding words can inadvertently be transcribed and may not be corrected upon review.

## 2016-07-11 NOTE — Telephone Encounter (Signed)
GAVE PATIENT AVS REPORT AND APPOINTMENTS FOR September.  °

## 2016-07-12 ENCOUNTER — Telehealth: Payer: Self-pay | Admitting: *Deleted

## 2016-07-12 NOTE — Telephone Encounter (Signed)
Called patient at [phone] (Home #) to check to see how they were doing from their ingrown toenail procedure that was performed on Thursday, June 23, 2016. I talked to patient's son due to his mom's lack of speaking [place]. The son stated, "Mom is doing okay and swelling has gone down; she is not in any pain".

## 2016-08-08 ENCOUNTER — Ambulatory Visit (HOSPITAL_BASED_OUTPATIENT_CLINIC_OR_DEPARTMENT_OTHER): Payer: Self-pay | Admitting: Internal Medicine

## 2016-08-08 ENCOUNTER — Encounter: Payer: Self-pay | Admitting: Internal Medicine

## 2016-08-08 ENCOUNTER — Other Ambulatory Visit (HOSPITAL_BASED_OUTPATIENT_CLINIC_OR_DEPARTMENT_OTHER): Payer: PRIVATE HEALTH INSURANCE

## 2016-08-08 ENCOUNTER — Telehealth: Payer: Self-pay | Admitting: Internal Medicine

## 2016-08-08 DIAGNOSIS — C3491 Malignant neoplasm of unspecified part of right bronchus or lung: Secondary | ICD-10-CM

## 2016-08-08 DIAGNOSIS — Z95828 Presence of other vascular implants and grafts: Secondary | ICD-10-CM

## 2016-08-08 DIAGNOSIS — R062 Wheezing: Secondary | ICD-10-CM

## 2016-08-08 DIAGNOSIS — L600 Ingrowing nail: Secondary | ICD-10-CM

## 2016-08-08 DIAGNOSIS — C3431 Malignant neoplasm of lower lobe, right bronchus or lung: Secondary | ICD-10-CM | POA: Diagnosis not present

## 2016-08-08 DIAGNOSIS — Z5111 Encounter for antineoplastic chemotherapy: Secondary | ICD-10-CM

## 2016-08-08 DIAGNOSIS — R05 Cough: Secondary | ICD-10-CM

## 2016-08-08 LAB — COMPREHENSIVE METABOLIC PANEL
ALT: 23 U/L (ref 0–55)
AST: 22 U/L (ref 5–34)
Albumin: 3.7 g/dL (ref 3.5–5.0)
Alkaline Phosphatase: 92 U/L (ref 40–150)
Anion Gap: 9 mEq/L (ref 3–11)
BUN: 7.9 mg/dL (ref 7.0–26.0)
CO2: 28 mEq/L (ref 22–29)
Calcium: 9.2 mg/dL (ref 8.4–10.4)
Chloride: 105 mEq/L (ref 98–109)
Creatinine: 0.7 mg/dL (ref 0.6–1.1)
EGFR: >90 mL/min/{1.73_m2} (ref >90)
Glucose: 111 mg/dl (ref 70–140)
Potassium: 3.7 mEq/L (ref 3.5–5.1)
Sodium: 142 mEq/L (ref 136–145)
Total Bilirubin: 0.62 mg/dL (ref 0.20–1.20)
Total Protein: 7.6 g/dL (ref 6.4–8.3)

## 2016-08-08 LAB — CBC WITH DIFFERENTIAL/PLATELET
BASO%: 0.1 % (ref 0.0–2.0)
Basophils Absolute: 0 10*3/uL (ref 0.0–0.1)
EOS%: 0.5 % (ref 0.0–7.0)
Eosinophils Absolute: 0 10*3/uL (ref 0.0–0.5)
HCT: 39.2 % (ref 34.8–46.6)
HGB: 12.7 g/dL (ref 11.6–15.9)
LYMPH%: 20.4 % (ref 14.0–49.7)
MCH: 27.9 pg (ref 25.1–34.0)
MCHC: 32.4 g/dL (ref 31.5–36.0)
MCV: 86.2 fL (ref 79.5–101.0)
MONO#: 0.5 10*3/uL (ref 0.1–0.9)
MONO%: 5.6 % (ref 0.0–14.0)
NEUT#: 6 10*3/uL (ref 1.5–6.5)
NEUT%: 73.4 % (ref 38.4–76.8)
Platelets: 304 10*3/uL (ref 145–400)
RBC: 4.55 10*6/uL (ref 3.70–5.45)
RDW: 13.8 % (ref 11.2–14.5)
WBC: 8.2 10*3/uL (ref 3.9–10.3)
lymph#: 1.7 10*3/uL (ref 0.9–3.3)

## 2016-08-08 MED ORDER — HYDROCOD POLST-CPM POLST ER 10-8 MG/5ML PO SUER: 5.0000 mL | Freq: Two times a day (BID) | ORAL | 0 refills | Status: DC | PRN

## 2016-08-08 MED ORDER — ALBUTEROL SULFATE HFA 108 (90 BASE) MCG/ACT IN AERS: 2.0000 | INHALATION_SPRAY | Freq: Four times a day (QID) | RESPIRATORY_TRACT | 1 refills | Status: DC | PRN

## 2016-08-08 NOTE — Telephone Encounter (Signed)
Gave patient avs report and appointments for October. [facility] radiology will call re scan and will contact [facility] re establishing primary care. Patient did not want to try internal medicine or family practice as she wanted to stay in this area.

## 2016-08-08 NOTE — Progress Notes (Signed)
[place] [phone]   [phone]  OFFICE PROGRESS NOTE  [name], MD [place] [address]  PRINCIPAL DIAGNOSIS: Local recurrence of non-small cell lung cancer, adenocarcinoma, initially diagnosed as stage IB (T2a N0 M0) in March of 2010.   PRIOR THERAPY:  1. Status post right lower lobectomy under the care of Dr. Cyndia Bent on 04/08/2009. 2. Status post palliative radiotherapy to the right lower lobe recurrent lung mass under the care of Dr. Lisbeth Renshaw, completed on June 08, 2011. 3. Systemic chemotherapy with carboplatin for AUC of 5 and Alimta 500 mg/M2 every 3 weeks. She is status post 3 cycles.  CURRENT THERAPY: Tarceva 150 mg by mouth daily, therapy beginning 07/24/2012. Status post approximately 42 months of therapy.   CHEMOTHERAPY INTENT: Palliative  CURRENT # OF CHEMOTHERAPY CYCLES: 43 CURRENT ANTIEMETICS: Compazine  CURRENT SMOKING STATUS: Nonsmoker  ORAL CHEMOTHERAPY AND CONSENT: Tarceva  CURRENT BISPHOSPHONATES USE: None  PAIN MANAGEMENT: None  NARCOTICS INDUCED CONSTIPATION: None  LIVING WILL AND CODE STATUS: Full code  INTERVAL HISTORY: [name] 55 y.o. female returns to the clinic today for follow up visit accompanied by her interpreter. The patient is feeling fine today with no specific complaints except for the persistent dry cough and she is currently on Tussionex and requesting refill of her medication. She continues to have mild skin rash of the right eyebrow. She has occasional episodes of diarrhea. She is tolerating her treatment with Tarceva fairly well. She had repeat CBC and comprehensive metabolic panel performed earlier today and she is here for evaluation and discussion of her lab results.  MEDICAL HISTORY: Past Medical History:  Diagnosis Date  . Encounter for antineoplastic chemotherapy 01/19/2016  . History of radiation therapy 05/02/11 to 06/08/11   right lung  . Lung cancer ([place])    right lower lobe adenocarcinoma    ALLERGIES:  is allergic to codeine and doxycycline.  MEDICATIONS:  Current Outpatient Prescriptions  Medication Sig Dispense Refill  . albuterol (PROVENTIL HFA;VENTOLIN HFA) 108 (90 Base) MCG/ACT inhaler Inhale 2 puffs into the lungs every 6 (six) hours as needed for wheezing. 1 Inhaler 1  . cephALEXin (KEFLEX) 500 MG capsule Take 1 capsule (500 mg total) by mouth 4 (four) times daily. 40 capsule 0  . chlorpheniramine-HYDROcodone (TUSSIONEX) 10-8 MG/5ML SUER Take 5 mLs by mouth every 12 (twelve) hours as needed for cough. 140 mL 0  . clindamycin (CLEOCIN T) 1 % lotion Apply topically 2 (two) times daily. (Patient taking differently: Apply topically 2 (two) times daily. ) 60 mL 0  . doxycycline (VIBRA-TABS) 100 MG tablet Take 1 tablet (100 mg total) by mouth 2 (two) times daily. 14 tablet 0  . erlotinib (TARCEVA) 150 MG tablet Take 1 tablet (150 mg total) by mouth daily. 30 tablet 2  . lidocaine-prilocaine (EMLA) cream Apply to Port-A-Cath prior to tx PRN 5 g prn 1 year  . omeprazole (PRILOSEC) 20 MG capsule Take 1 capsule (20 mg total) by mouth daily. 30 capsule 1  . prochlorperazine (COMPAZINE) 10 MG tablet Take 1 tablet (10 mg total) by mouth every 6 (six) hours as needed for nausea or vomiting. 30 tablet 0   No current facility-administered medications for this visit.     SURGICAL HISTORY:  Past Surgical History:  Procedure Laterality Date  . LUNG LOBECTOMY  04/18/2009   RLL  . PORTACATH PLACEMENT  02/29/2012   Procedure: INSERTION PORT-A-CATH;  Surgeon: Nicanor Alcon, MD;  Location: [facility];  Service: Thoracic;  Laterality: Left;  PowerPort 8 F attachable in left internal jugular.    REVIEW OF SYSTEMS:  A comprehensive review of systems was negative except for: Respiratory: positive for cough Integument/breast: positive for rash   PHYSICAL EXAMINATION: General appearance: alert, cooperative and no distress Head: Normocephalic, without obvious  abnormality, atraumatic Neck: no adenopathy, no JVD, supple, symmetrical, trachea midline and thyroid not enlarged, symmetric, no tenderness/mass/nodules Lymph nodes: Cervical, supraclavicular, and axillary nodes normal. Resp: wheezes bilaterally Back: symmetric, no curvature. ROM normal. No CVA tenderness. Cardio: regular rate and rhythm, S1, S2 normal, no murmur, click, rub or gallop GI: soft, non-tender; bowel sounds normal; no masses,  no organomegaly Extremities: extremities normal, atraumatic, no cyanosis or edema Neurologic: Alert and oriented X 3, normal strength and tone. Normal symmetric reflexes. Normal coordination and gait  ECOG PERFORMANCE STATUS: 1 - Symptomatic but completely ambulatory  Blood pressure 133/84, pulse 91, temperature 98.8 F (37.1 C), temperature source Oral, resp. rate 18, height 5' (1.524 m), weight 127 lb 8 oz (57.8 kg), last menstrual period 02/10/2012, SpO2 99 %.  LABORATORY DATA: Lab Results  Component Value Date   WBC 8.2 08/08/2016   HGB 12.7 08/08/2016   HCT 39.2 08/08/2016   MCV 86.2 08/08/2016   PLT 304 08/08/2016      Chemistry      Component Value Date/Time   NA 140 07/11/2016 0947   K 4.6 07/11/2016 0947   CL 102 09/09/2014 0410   CL 102 04/30/2013 1002   CO2 28 07/11/2016 0947   BUN 16.5 07/11/2016 0947   CREATININE 0.7 07/11/2016 0947      Component Value Date/Time   CALCIUM 9.7 07/11/2016 0947   ALKPHOS 98 07/11/2016 0947   AST 27 07/11/2016 0947   ALT 25 07/11/2016 0947   BILITOT 0.58 07/11/2016 0947       RADIOGRAPHIC STUDIES: No results found.  ASSESSMENT AND PLAN: This is a very pleasant 55 years old Asian female with recurrent non-small cell lung cancer, adenocarcinoma. She is currently on treatment with oral Tarceva 150 mg by mouth daily status post 42 months of treatment. She is tolerating her treatment with Tarceva fairly well.  I recommended for her to continue her current treatment with Tarceva. For the chest  wheezing, I advised the patient to continue with her inhaler every 6 hours as needed. She was given a refill of her inhaler For the cough, she will continue on Tussionex. She was also given a refill of her cough medicine. For the dry skin, the patient was advised to use clindamycin lotions.  I will see the patient back for follow-up visit in one month with repeat blood work as well as CT scan of the chest for restaging of her disease. I will refer the patient to [facility] family practice to establish care with a primary care physician. She was advised to call immediately if she has any concerning symptoms in the interval. The patient voices understanding of current disease status and treatment options and is in agreement with the current care plan.  All questions were answered. The patient knows to call the clinic with any problems, questions or concerns. We can certainly see the patient much sooner if necessary.  Disclaimer: This note was dictated with voice recognition software. Similar sounding words can inadvertently be transcribed and may not be corrected upon review.

## 2016-08-14 ENCOUNTER — Other Ambulatory Visit: Payer: Self-pay | Admitting: Internal Medicine

## 2016-08-14 DIAGNOSIS — C3491 Malignant neoplasm of unspecified part of right bronchus or lung: Secondary | ICD-10-CM

## 2016-08-14 DIAGNOSIS — Z95828 Presence of other vascular implants and grafts: Secondary | ICD-10-CM

## 2016-08-14 DIAGNOSIS — C3431 Malignant neoplasm of lower lobe, right bronchus or lung: Secondary | ICD-10-CM

## 2016-09-05 ENCOUNTER — Other Ambulatory Visit: Payer: Self-pay

## 2016-09-09 ENCOUNTER — Other Ambulatory Visit (HOSPITAL_BASED_OUTPATIENT_CLINIC_OR_DEPARTMENT_OTHER): Payer: PRIVATE HEALTH INSURANCE

## 2016-09-09 ENCOUNTER — Ambulatory Visit (HOSPITAL_COMMUNITY)
Admission: RE | Admit: 2016-09-09 | Discharge: 2016-09-09 | Disposition: A | Discharge status: Home / Self Care | Payer: PRIVATE HEALTH INSURANCE | Source: Ambulatory Visit | Attending: Internal Medicine | Admitting: Internal Medicine

## 2016-09-09 DIAGNOSIS — C3431 Malignant neoplasm of lower lobe, right bronchus or lung: Secondary | ICD-10-CM

## 2016-09-09 DIAGNOSIS — R911 Solitary pulmonary nodule: Secondary | ICD-10-CM | POA: Diagnosis not present

## 2016-09-09 DIAGNOSIS — L600 Ingrowing nail: Secondary | ICD-10-CM

## 2016-09-09 DIAGNOSIS — C3491 Malignant neoplasm of unspecified part of right bronchus or lung: Secondary | ICD-10-CM | POA: Diagnosis not present

## 2016-09-09 DIAGNOSIS — C3411 Malignant neoplasm of upper lobe, right bronchus or lung: Secondary | ICD-10-CM | POA: Diagnosis not present

## 2016-09-09 DIAGNOSIS — Z95828 Presence of other vascular implants and grafts: Secondary | ICD-10-CM

## 2016-09-09 DIAGNOSIS — J90 Pleural effusion, not elsewhere classified: Secondary | ICD-10-CM | POA: Insufficient documentation

## 2016-09-09 LAB — CBC WITH DIFFERENTIAL/PLATELET
BASO%: 0.4 % (ref 0.0–2.0)
Basophils Absolute: 0 10*3/uL (ref 0.0–0.1)
EOS%: 1.1 % (ref 0.0–7.0)
Eosinophils Absolute: 0.1 10*3/uL (ref 0.0–0.5)
HCT: 43.1 % (ref 34.8–46.6)
HGB: 14 g/dL (ref 11.6–15.9)
LYMPH%: 16.7 % (ref 14.0–49.7)
MCH: 28 pg (ref 25.1–34.0)
MCHC: 32.6 g/dL (ref 31.5–36.0)
MCV: 85.7 fL (ref 79.5–101.0)
MONO#: 0.5 10*3/uL (ref 0.1–0.9)
MONO%: 5.7 % (ref 0.0–14.0)
NEUT#: 7.2 10*3/uL — ABNORMAL HIGH (ref 1.5–6.5)
NEUT%: 76.1 % (ref 38.4–76.8)
Platelets: 381 10*3/uL (ref 145–400)
RBC: 5.02 10*6/uL (ref 3.70–5.45)
RDW: 14.5 % (ref 11.2–14.5)
WBC: 9.4 10*3/uL (ref 3.9–10.3)
lymph#: 1.6 10*3/uL (ref 0.9–3.3)

## 2016-09-09 LAB — COMPREHENSIVE METABOLIC PANEL
ALT: 21 U/L (ref 0–55)
AST: 25 U/L (ref 5–34)
Albumin: 3.9 g/dL (ref 3.5–5.0)
Alkaline Phosphatase: 110 U/L (ref 40–150)
Anion Gap: 9 mEq/L (ref 3–11)
BUN: 11.9 mg/dL (ref 7.0–26.0)
CO2: 29 mEq/L (ref 22–29)
Calcium: 9.6 mg/dL (ref 8.4–10.4)
Chloride: 104 mEq/L (ref 98–109)
Creatinine: 0.7 mg/dL (ref 0.6–1.1)
EGFR: >90 mL/min/{1.73_m2} (ref >90)
Glucose: 109 mg/dl (ref 70–140)
Potassium: 4.5 mEq/L (ref 3.5–5.1)
Sodium: 142 mEq/L (ref 136–145)
Total Bilirubin: 0.65 mg/dL (ref 0.20–1.20)
Total Protein: 8.3 g/dL (ref 6.4–8.3)

## 2016-09-09 MED ORDER — IOPAMIDOL (ISOVUE-300) INJECTION 61%
75.0000 mL | Freq: Once | INTRAVENOUS | Status: AC | PRN
  Administered 2016-09-09: 75 mL via INTRAVENOUS

## 2016-09-12 ENCOUNTER — Encounter: Payer: Self-pay | Admitting: Internal Medicine

## 2016-09-12 ENCOUNTER — Ambulatory Visit (HOSPITAL_BASED_OUTPATIENT_CLINIC_OR_DEPARTMENT_OTHER): Payer: PRIVATE HEALTH INSURANCE | Admitting: Internal Medicine

## 2016-09-12 ENCOUNTER — Telehealth: Payer: Self-pay | Admitting: Internal Medicine

## 2016-09-12 VITALS — BP 111/75 | HR 86 | Temp 98.3°F | Resp 16 | Ht 60.0 in | Wt 126.5 lb

## 2016-09-12 DIAGNOSIS — R062 Wheezing: Secondary | ICD-10-CM | POA: Diagnosis not present

## 2016-09-12 DIAGNOSIS — C3431 Malignant neoplasm of lower lobe, right bronchus or lung: Secondary | ICD-10-CM | POA: Diagnosis not present

## 2016-09-12 DIAGNOSIS — Z5111 Encounter for antineoplastic chemotherapy: Secondary | ICD-10-CM

## 2016-09-12 NOTE — Telephone Encounter (Signed)
Avs report and appointment schedule given to patient,per 09/12/16 los.

## 2016-09-12 NOTE — Progress Notes (Signed)
[name] [phone]   Fax:(336) [phone]  OFFICE PROGRESS NOTE  [name], MD [place] [address]  PRINCIPAL DIAGNOSIS: Local recurrence of non-small cell lung cancer, adenocarcinoma, initially diagnosed as stage IB (T2a N0 M0) in March of 2010.   PRIOR THERAPY:  1. Status post right lower lobectomy under the care of Dr. Cyndia Bent on 04/08/2009. 2. Status post palliative radiotherapy to the right lower lobe recurrent lung mass under the care of Dr. Lisbeth Renshaw, completed on June 08, 2011. 3. Systemic chemotherapy with carboplatin for AUC of 5 and Alimta 500 mg/M2 every 3 weeks. She is status post 3 cycles.  CURRENT THERAPY: Tarceva 150 mg by mouth daily, therapy beginning 07/24/2012. Status post approximately 43 months of therapy.   CHEMOTHERAPY INTENT: Palliative  CURRENT # OF CHEMOTHERAPY CYCLES: 44 CURRENT ANTIEMETICS: Compazine  CURRENT SMOKING STATUS: Nonsmoker  ORAL CHEMOTHERAPY AND CONSENT: Tarceva  CURRENT BISPHOSPHONATES USE: None  PAIN MANAGEMENT: None  NARCOTICS INDUCED CONSTIPATION: None  LIVING WILL AND CODE STATUS: Full code  INTERVAL HISTORY: [name] 55 y.o. female returns to the clinic today for follow up visit accompanied by her interpreter. The patient is feeling fine today with no specific complaints except for the persistent dry cough and she is currently on Tussionex. She continues to have mild skin rash of the right eyebrow. She has occasional episodes of diarrhea. She is tolerating her treatment with Tarceva fairly well. She had repeat CBC and comprehensive metabolic panel as well as CT scan of the chest performed recently and she is here today for evaluation and discussion of her scan results.  MEDICAL HISTORY: Past Medical History:  Diagnosis Date  . Encounter for antineoplastic chemotherapy 01/19/2016  . History of radiation therapy 05/02/11 to 06/08/11   right lung  . Lung cancer ([place])    right  lower lobe adenocarcinoma    ALLERGIES:  is allergic to codeine and doxycycline.  MEDICATIONS:  Current Outpatient Prescriptions  Medication Sig Dispense Refill  . albuterol (PROVENTIL HFA;VENTOLIN HFA) 108 (90 Base) MCG/ACT inhaler Inhale 2 puffs into the lungs every 6 (six) hours as needed for wheezing. 1 Inhaler 1  . cephALEXin (KEFLEX) 500 MG capsule Take 1 capsule (500 mg total) by mouth 4 (four) times daily. 40 capsule 0  . chlorpheniramine-HYDROcodone (TUSSIONEX) 10-8 MG/5ML SUER Take 5 mLs by mouth every 12 (twelve) hours as needed for cough. 140 mL 0  . clindamycin (CLEOCIN T) 1 % lotion Apply topically 2 (two) times daily. (Patient taking differently: Apply topically 2 (two) times daily. ) 60 mL 0  . doxycycline (VIBRA-TABS) 100 MG tablet Take 1 tablet (100 mg total) by mouth 2 (two) times daily. 14 tablet 0  . lidocaine-prilocaine (EMLA) cream Apply to Port-A-Cath prior to tx PRN 5 g prn 1 year  . omeprazole (PRILOSEC) 20 MG capsule Take 1 capsule (20 mg total) by mouth daily. 30 capsule 1  . TARCEVA 150 MG tablet Take 1 tablet ( 150 mg total )  by mouth daily 30 tablet 5  . prochlorperazine (COMPAZINE) 10 MG tablet Take 1 tablet (10 mg total) by mouth every 6 (six) hours as needed for nausea or vomiting. (Patient not taking: Reported on 09/12/2016) 30 tablet 0   No current facility-administered medications for this visit.     SURGICAL HISTORY:  Past Surgical History:  Procedure Laterality Date  . LUNG LOBECTOMY  04/18/2009   RLL  . PORTACATH PLACEMENT  02/29/2012  Procedure: INSERTION PORT-A-CATH;  Surgeon: Nicanor Alcon, MD;  Location: [facility];  Service: Thoracic;  Laterality: Left;  PowerPort 8 F attachable in left internal jugular.    REVIEW OF SYSTEMS:  Constitutional: negative Eyes: negative Ears, nose, mouth, throat, and face: negative Respiratory: positive for cough Cardiovascular: negative Gastrointestinal: negative Genitourinary:negative Integument/breast:  negative Hematologic/lymphatic: negative Musculoskeletal:negative Neurological: negative Behavioral/Psych: negative Endocrine: negative Allergic/Immunologic: negative   PHYSICAL EXAMINATION: General appearance: alert, cooperative and no distress Head: Normocephalic, without obvious abnormality, atraumatic Neck: no adenopathy, no JVD, supple, symmetrical, trachea midline and thyroid not enlarged, symmetric, no tenderness/mass/nodules Lymph nodes: Cervical, supraclavicular, and axillary nodes normal. Resp: wheezes bilaterally Back: symmetric, no curvature. ROM normal. No CVA tenderness. Cardio: regular rate and rhythm, S1, S2 normal, no murmur, click, rub or gallop GI: soft, non-tender; bowel sounds normal; no masses,  no organomegaly Extremities: extremities normal, atraumatic, no cyanosis or edema Neurologic: Alert and oriented X 3, normal strength and tone. Normal symmetric reflexes. Normal coordination and gait  ECOG PERFORMANCE STATUS: 1 - Symptomatic but completely ambulatory  Blood pressure 111/75, pulse 86, temperature 98.3 F (36.8 C), temperature source Oral, resp. rate 16, height 5' (1.524 m), weight 126 lb 8 oz (57.4 kg), last menstrual period 02/10/2012, SpO2 100 %.  LABORATORY DATA: Lab Results  Component Value Date   WBC 9.4 09/09/2016   HGB 14.0 09/09/2016   HCT 43.1 09/09/2016   MCV 85.7 09/09/2016   PLT 381 09/09/2016      Chemistry      Component Value Date/Time   NA 142 09/09/2016 1505   K 4.5 09/09/2016 1505   CL 102 09/09/2014 0410   CL 102 04/30/2013 1002   CO2 29 09/09/2016 1505   BUN 11.9 09/09/2016 1505   CREATININE 0.7 09/09/2016 1505      Component Value Date/Time   CALCIUM 9.6 09/09/2016 1505   ALKPHOS 110 09/09/2016 1505   AST 25 09/09/2016 1505   ALT 21 09/09/2016 1505   BILITOT 0.65 09/09/2016 1505       RADIOGRAPHIC STUDIES: Ct Chest W Contrast  Result Date: 09/09/2016 CLINICAL DATA:  Right lung cancer restaging. EXAM: CT CHEST  WITH CONTRAST TECHNIQUE: Multidetector CT imaging of the chest was performed during intravenous contrast administration. CONTRAST:  41m ISOVUE-300 IOPAMIDOL (ISOVUE-300) INJECTION 61% COMPARISON:  06/07/2016. FINDINGS: Cardiovascular: Left Port-A-Cath tip is positioned at the SVC/ RA junction. Heart size is upper normal. No pericardial effusion. Coronary artery calcification is noted. No thoracic aortic aneurysm. Mediastinum/Nodes: No mediastinal lymphadenopathy. No left hilar lymphadenopathy. Postsurgical/treatment changes noted in the right hilum. The esophagus has normal imaging features. There is no axillary lymphadenopathy. 7 mm short axis anterior right juxta diaphragmatic lymph node is stable. Lungs/Pleura: Stable appearance small right pleural effusion. Stable appearance post radiation fibrosis in the medial right lung. 5 mm right lung nodule seen image 47 series 5 is stable in the interval. No change and right hemithoracic volume loss. Left lung remains clear. Upper Abdomen: Unremarkable. Musculoskeletal: Bone windows reveal no worrisome lytic or sclerotic osseous lesions. Nonunion posterior right ribs stable and likely postsurgical. IMPRESSION: 1. Stable exam.  No new or progressive findings. 2. Post radiation changes medial right lung with stable appearance small right pleural effusion. 3. No change 5 mm right lung nodule. Electronically Signed   By: EMisty StanleyM.D.   On: 09/09/2016 17:31    ASSESSMENT AND PLAN: This is a very pleasant 55years old Asian female with recurrent non-small cell lung cancer, adenocarcinoma. She  is currently on treatment with oral Tarceva 150 mg by mouth daily status post 43 months of treatment. She is tolerating her treatment with Tarceva fairly well.  The recent CT scan of the chest showed no evidence for disease progression. I discussed the scan results with the patient today. I recommended for her to continue her current treatment with Tarceva. For the chest  wheezing, I advised the patient to continue with her inhaler every 6 hours as needed. She was given a refill of her inhaler For the cough, she will continue on Tussionex. For the dry skin, the patient was advised to use clindamycin lotions.  I will see the patient back for follow-up visit in one month with repeat blood work. She was advised to call immediately if she has any concerning symptoms in the interval. The patient voices understanding of current disease status and treatment options and is in agreement with the current care plan.  All questions were answered. The patient knows to call the clinic with any problems, questions or concerns. We can certainly see the patient much sooner if necessary.  Disclaimer: This note was dictated with voice recognition software. Similar sounding words can inadvertently be transcribed and may not be corrected upon review.

## 2016-10-03 DIAGNOSIS — Z01419 Encounter for gynecological examination (general) (routine) without abnormal findings: Secondary | ICD-10-CM | POA: Diagnosis not present

## 2016-10-03 DIAGNOSIS — Z1231 Encounter for screening mammogram for malignant neoplasm of breast: Secondary | ICD-10-CM | POA: Diagnosis not present

## 2016-10-03 DIAGNOSIS — Z124 Encounter for screening for malignant neoplasm of cervix: Secondary | ICD-10-CM | POA: Diagnosis not present

## 2016-10-03 DIAGNOSIS — Z6824 Body mass index (BMI) 24.0-24.9, adult: Secondary | ICD-10-CM | POA: Diagnosis not present

## 2016-10-11 ENCOUNTER — Ambulatory Visit: Payer: Self-pay | Admitting: Internal Medicine

## 2016-10-11 ENCOUNTER — Other Ambulatory Visit: Payer: Self-pay

## 2016-11-02 ENCOUNTER — Encounter: Payer: Self-pay | Admitting: *Deleted

## 2016-11-02 ENCOUNTER — Other Ambulatory Visit: Payer: Self-pay | Admitting: *Deleted

## 2016-11-02 ENCOUNTER — Encounter: Payer: Self-pay | Admitting: Nurse Practitioner

## 2016-11-02 ENCOUNTER — Encounter: Payer: Self-pay | Admitting: Internal Medicine

## 2016-11-02 ENCOUNTER — Ambulatory Visit (HOSPITAL_BASED_OUTPATIENT_CLINIC_OR_DEPARTMENT_OTHER): Payer: PRIVATE HEALTH INSURANCE | Admitting: Nurse Practitioner

## 2016-11-02 ENCOUNTER — Emergency Department (HOSPITAL_COMMUNITY)
Admission: EM | Admit: 2016-11-02 | Discharge: 2016-11-02 | Disposition: A | Discharge status: Home / Self Care | Payer: PRIVATE HEALTH INSURANCE | Attending: Emergency Medicine | Admitting: Emergency Medicine

## 2016-11-02 ENCOUNTER — Encounter (HOSPITAL_COMMUNITY): Payer: Self-pay | Admitting: Emergency Medicine

## 2016-11-02 DIAGNOSIS — J705 Respiratory conditions due to smoke inhalation: Secondary | ICD-10-CM | POA: Insufficient documentation

## 2016-11-02 DIAGNOSIS — C3431 Malignant neoplasm of lower lobe, right bronchus or lung: Secondary | ICD-10-CM | POA: Diagnosis not present

## 2016-11-02 DIAGNOSIS — Z95828 Presence of other vascular implants and grafts: Secondary | ICD-10-CM

## 2016-11-02 DIAGNOSIS — Z79899 Other long term (current) drug therapy: Secondary | ICD-10-CM | POA: Insufficient documentation

## 2016-11-02 DIAGNOSIS — C3491 Malignant neoplasm of unspecified part of right bronchus or lung: Secondary | ICD-10-CM

## 2016-11-02 DIAGNOSIS — T59811A Toxic effect of smoke, accidental (unintentional), initial encounter: Secondary | ICD-10-CM

## 2016-11-02 DIAGNOSIS — L600 Ingrowing nail: Secondary | ICD-10-CM

## 2016-11-02 DIAGNOSIS — Z85118 Personal history of other malignant neoplasm of bronchus and lung: Secondary | ICD-10-CM | POA: Insufficient documentation

## 2016-11-02 LAB — BASIC METABOLIC PANEL
Anion gap: 9 (ref 5–15)
BUN: 12 mg/dL (ref 6–20)
CO2: 29 mmol/L (ref 22–32)
Calcium: 9.3 mg/dL (ref 8.9–10.3)
Chloride: 100 mmol/L — ABNORMAL LOW (ref 101–111)
Creatinine, Ser: 0.45 mg/dL (ref 0.44–1.00)
GFR calc Af Amer: >60 mL/min (ref >60)
GFR calc non Af Amer: >60 mL/min (ref >60)
Glucose, Bld: 98 mg/dL (ref 65–99)
Potassium: 4.2 mmol/L (ref 3.5–5.1)
Sodium: 138 mmol/L (ref 135–145)

## 2016-11-02 LAB — CBC WITH DIFFERENTIAL/PLATELET
Basophils Absolute: 0 10*3/uL (ref 0.0–0.1)
Basophils Relative: 0 %
Eosinophils Absolute: 0 10*3/uL (ref 0.0–0.7)
Eosinophils Relative: 0 %
HCT: 42.6 % (ref 36.0–46.0)
Hemoglobin: 13.8 g/dL (ref 12.0–15.0)
Lymphocytes Relative: 11 %
Lymphs Abs: 1.5 10*3/uL (ref 0.7–4.0)
MCH: 28.6 pg (ref 26.0–34.0)
MCHC: 32.4 g/dL (ref 30.0–36.0)
MCV: 88.2 fL (ref 78.0–100.0)
Monocytes Absolute: 0.9 10*3/uL (ref 0.1–1.0)
Monocytes Relative: 7 %
Neutro Abs: 11 10*3/uL — ABNORMAL HIGH (ref 1.7–7.7)
Neutrophils Relative %: 82 %
Platelets: 358 10*3/uL (ref 150–400)
RBC: 4.83 MIL/uL (ref 3.87–5.11)
RDW: 14.2 % (ref 11.5–15.5)
WBC: 13.3 10*3/uL — ABNORMAL HIGH (ref 4.0–10.5)

## 2016-11-02 LAB — CARBOXYHEMOGLOBIN - COOX: Carboxyhemoglobin: 2.2 % — ABNORMAL HIGH (ref 0.5–1.5)

## 2016-11-02 MED ORDER — CLINDAMYCIN PHOSPHATE 1 % EX LOTN
TOPICAL_LOTION | Freq: Two times a day (BID) | CUTANEOUS | 0 refills | Status: DC
Start: 2016-11-02 — End: 2017-05-21

## 2016-11-02 MED ORDER — DOXYCYCLINE HYCLATE 100 MG PO TABS: 100.0000 mg | ORAL_TABLET | Freq: Two times a day (BID) | ORAL | 0 refills | Status: DC

## 2016-11-02 MED ORDER — CLINDAMYCIN PHOSPHATE 1 % EX LOTN: TOPICAL_LOTION | Freq: Two times a day (BID) | CUTANEOUS | 0 refills | Status: DC

## 2016-11-02 MED ORDER — HYDROCOD POLST-CPM POLST ER 10-8 MG/5ML PO SUER: 5.0000 mL | Freq: Two times a day (BID) | ORAL | 0 refills | Status: DC | PRN

## 2016-11-02 MED FILL — CLINDAMYCIN PHOSP 1% LOTION: 1 | 30 days supply | Qty: 60 | Fill #0

## 2016-11-02 MED FILL — HYDROCODONE-CHLORPHENIRAM S: 10-8 | 14 days supply | Qty: 140 | Fill #0

## 2016-11-02 MED FILL — DOXYCYCLINE HYCLATE 100 MG: 100 | 7 days supply | Qty: 14 | Fill #0
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
## 2016-11-02 NOTE — Progress Notes (Signed)
Received PA request form for Tarceva. Completed and had Mechele Claude to advise how to answer questions. Obtained physician signature. Faxed to [employer]. Fax received ok per confirmation sheet.

## 2016-11-02 NOTE — Discharge Instructions (Signed)
Your exam, labs were all very reassuring. It is important for you to continue taking deep respirations. Follow-up with your doctor in the next week for reevaluation as needed. Return to ED for new or worsening symptoms.

## 2016-11-02 NOTE — ED Notes (Signed)
Bed: TV81 Expected date:  Expected time:  Means of arrival:  Comments: [facility] pt- [place]

## 2016-11-02 NOTE — ED Notes (Signed)
CSW at bedside.
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
## 2016-11-03 ENCOUNTER — Encounter: Payer: Self-pay | Admitting: *Deleted
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
## 2016-11-07 ENCOUNTER — Telehealth: Payer: Self-pay | Admitting: Medical Oncology

## 2016-11-07 ENCOUNTER — Encounter: Payer: Self-pay | Admitting: Internal Medicine

## 2016-11-07 NOTE — Telephone Encounter (Signed)
I returned Marks call re Tarceva. Left message to call me back

## 2016-11-07 NOTE — Progress Notes (Signed)
Received PA approval from [employer] for [place] for 2018.  PA approved 11/29/16-05/29/17.

## 2016-11-07 NOTE — Telephone Encounter (Signed)
[name] from 11/29/16-05/29/17

## 2016-12-01 ENCOUNTER — Telehealth: Payer: Self-pay | Admitting: Medical Oncology

## 2016-12-01 DIAGNOSIS — L600 Ingrowing nail: Secondary | ICD-10-CM

## 2016-12-01 DIAGNOSIS — C3491 Malignant neoplasm of unspecified part of right bronchus or lung: Secondary | ICD-10-CM

## 2016-12-01 DIAGNOSIS — Z95828 Presence of other vascular implants and grafts: Secondary | ICD-10-CM

## 2016-12-01 DIAGNOSIS — C3431 Malignant neoplasm of lower lobe, right bronchus or lung: Secondary | ICD-10-CM

## 2016-12-01 MED ORDER — HYDROCOD POLST-CPM POLST ER 10-8 MG/5ML PO SUER: 5.0000 mL | Freq: Two times a day (BID) | ORAL | 0 refills | Status: DC | PRN

## 2016-12-01 NOTE — Telephone Encounter (Signed)
Son notified rx ready for pick up.

## 2016-12-07 ENCOUNTER — Telehealth: Payer: Self-pay | Admitting: Internal Medicine

## 2016-12-07 NOTE — Telephone Encounter (Signed)
Patient son saw and missed call on his phone and called back to confirm appointments for 1/30 at 12:30 pm. Patient son aware patient will have lab 1st, then radonc and then MM.

## 2016-12-07 NOTE — Telephone Encounter (Signed)
Received message from switchboard re patient son calling to rescheduling missed appointments. Not able to reach patient or son at any of the numbers listed in EPIC or leave message.   Scheduled lab/fu for 1/30 same as radiation oncology visit and mailed schedule.

## 2016-12-16 NOTE — Progress Notes (Addendum)
Mrs. [name] 56  y.o. woman with  Non-small cell lung cancer Right lung Completed 03-31-16 right lung site one month FU.  Weight changes, if any: Respiratory complaints, if any:  Hemoptysis, if any:   Swallowing Problems/Pain/Difficulty swallowing: Smoking Tobacco/Marijuana/Snuff/ETOH use: Never a smoker no alcohol or drug usuage Appetite : Pain:  When is next chemo scheduled?: Lab work from of chart: Imaging:

## 2016-12-26 ENCOUNTER — Encounter: Payer: Self-pay | Admitting: *Deleted

## 2016-12-26 NOTE — Progress Notes (Signed)
[name] for [employer] after numerous attempts today to cancel the Radiation oncology follow up appointment with Shona Simpson, PA-C for Tuesday, 12-27-16 at 1315; It is fine for his mother just to have her appointment with Dr. Julien Nordmann at 1400.  Her son [name] was able to repeat the information back and replied that he would tell his mother that she has only one appointment tomorrow with Dr. Julien Nordmann.

## 2016-12-27 ENCOUNTER — Ambulatory Visit (HOSPITAL_BASED_OUTPATIENT_CLINIC_OR_DEPARTMENT_OTHER): Payer: PRIVATE HEALTH INSURANCE | Admitting: Internal Medicine

## 2016-12-27 ENCOUNTER — Telehealth: Payer: Self-pay | Admitting: Internal Medicine

## 2016-12-27 ENCOUNTER — Ambulatory Visit
Admission: RE | Admit: 2016-12-27 | Discharge: 2016-12-27 | Disposition: A | Discharge status: Home / Self Care | Payer: PRIVATE HEALTH INSURANCE | Source: Ambulatory Visit | Attending: Radiation Oncology | Admitting: Radiation Oncology

## 2016-12-27 ENCOUNTER — Ambulatory Visit (HOSPITAL_BASED_OUTPATIENT_CLINIC_OR_DEPARTMENT_OTHER): Payer: PRIVATE HEALTH INSURANCE

## 2016-12-27 ENCOUNTER — Encounter: Payer: Self-pay | Admitting: Internal Medicine

## 2016-12-27 ENCOUNTER — Other Ambulatory Visit (HOSPITAL_BASED_OUTPATIENT_CLINIC_OR_DEPARTMENT_OTHER): Payer: PRIVATE HEALTH INSURANCE

## 2016-12-27 DIAGNOSIS — Z95828 Presence of other vascular implants and grafts: Secondary | ICD-10-CM

## 2016-12-27 DIAGNOSIS — C3431 Malignant neoplasm of lower lobe, right bronchus or lung: Secondary | ICD-10-CM

## 2016-12-27 DIAGNOSIS — R05 Cough: Secondary | ICD-10-CM

## 2016-12-27 DIAGNOSIS — L600 Ingrowing nail: Secondary | ICD-10-CM

## 2016-12-27 DIAGNOSIS — C3491 Malignant neoplasm of unspecified part of right bronchus or lung: Secondary | ICD-10-CM

## 2016-12-27 DIAGNOSIS — Z5111 Encounter for antineoplastic chemotherapy: Secondary | ICD-10-CM

## 2016-12-27 LAB — CBC WITH DIFFERENTIAL/PLATELET
BASO%: 0.2 % (ref 0.0–2.0)
Basophils Absolute: 0 10*3/uL (ref 0.0–0.1)
EOS%: 1.6 % (ref 0.0–7.0)
Eosinophils Absolute: 0.1 10*3/uL (ref 0.0–0.5)
HCT: 42.3 % (ref 34.8–46.6)
HGB: 13.6 g/dL (ref 11.6–15.9)
LYMPH%: 20.4 % (ref 14.0–49.7)
MCH: 28.2 pg (ref 25.1–34.0)
MCHC: 32.2 g/dL (ref 31.5–36.0)
MCV: 87.8 fL (ref 79.5–101.0)
MONO#: 0.7 10*3/uL (ref 0.1–0.9)
MONO%: 7.6 % (ref 0.0–14.0)
NEUT#: 6.1 10*3/uL (ref 1.5–6.5)
NEUT%: 70.2 % (ref 38.4–76.8)
Platelets: 321 10*3/uL (ref 145–400)
RBC: 4.82 10*6/uL (ref 3.70–5.45)
RDW: 14.3 % (ref 11.2–14.5)
WBC: 8.7 10*3/uL (ref 3.9–10.3)
lymph#: 1.8 10*3/uL (ref 0.9–3.3)

## 2016-12-27 LAB — COMPREHENSIVE METABOLIC PANEL
ALT: 17 U/L (ref 0–55)
AST: 23 U/L (ref 5–34)
Albumin: 4.1 g/dL (ref 3.5–5.0)
Alkaline Phosphatase: 89 U/L (ref 40–150)
Anion Gap: 7 mEq/L (ref 3–11)
BUN: 10.3 mg/dL (ref 7.0–26.0)
CO2: 31 mEq/L — ABNORMAL HIGH (ref 22–29)
Calcium: 9.6 mg/dL (ref 8.4–10.4)
Chloride: 104 mEq/L (ref 98–109)
Creatinine: 0.7 mg/dL (ref 0.6–1.1)
EGFR: >90 mL/min/{1.73_m2} (ref >90)
Glucose: 71 mg/dl (ref 70–140)
Potassium: 4.7 mEq/L (ref 3.5–5.1)
Sodium: 141 mEq/L (ref 136–145)
Total Bilirubin: 0.72 mg/dL (ref 0.20–1.20)
Total Protein: 8 g/dL (ref 6.4–8.3)

## 2016-12-27 MED ORDER — LIDOCAINE-PRILOCAINE 2.5-2.5 % EX CREA: TOPICAL_CREAM | CUTANEOUS | Status: DC

## 2016-12-27 MED ORDER — SODIUM CHLORIDE 0.9% FLUSH
10.0000 mL | INTRAVENOUS | Status: DC | PRN
  Administered 2016-12-27: 10 mL via INTRAVENOUS
  Filled 2016-12-27: qty 10

## 2016-12-27 MED ORDER — HEPARIN SOD (PORK) LOCK FLUSH 100 UNIT/ML IV SOLN
500.0000 [IU] | Freq: Once | INTRAVENOUS | Status: AC | PRN
  Administered 2016-12-27: 500 [IU] via INTRAVENOUS
  Filled 2016-12-27: qty 5

## 2016-12-27 MED ORDER — HYDROCOD POLST-CPM POLST ER 10-8 MG/5ML PO SUER: 5.0000 mL | Freq: Two times a day (BID) | ORAL | 0 refills | Status: DC | PRN

## 2016-12-27 NOTE — Telephone Encounter (Signed)
Gave patient avs report and appointments for February and March. Central radiology will call re scan.
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
## 2016-12-28 ENCOUNTER — Other Ambulatory Visit: Payer: Self-pay

## 2016-12-28 ENCOUNTER — Ambulatory Visit: Payer: Self-pay | Admitting: Internal Medicine

## 2016-12-31 ENCOUNTER — Other Ambulatory Visit: Payer: Self-pay

## 2016-12-31 ENCOUNTER — Emergency Department (HOSPITAL_COMMUNITY)
Admission: EM | Admit: 2016-12-31 | Discharge: 2016-12-31 | Disposition: A | Discharge status: Home / Self Care | Payer: PRIVATE HEALTH INSURANCE | Attending: Emergency Medicine | Admitting: Emergency Medicine

## 2016-12-31 ENCOUNTER — Emergency Department (HOSPITAL_COMMUNITY): Payer: PRIVATE HEALTH INSURANCE

## 2016-12-31 ENCOUNTER — Encounter (HOSPITAL_COMMUNITY): Payer: Self-pay | Admitting: Emergency Medicine

## 2016-12-31 DIAGNOSIS — R0781 Pleurodynia: Secondary | ICD-10-CM

## 2016-12-31 DIAGNOSIS — Z85118 Personal history of other malignant neoplasm of bronchus and lung: Secondary | ICD-10-CM | POA: Diagnosis not present

## 2016-12-31 DIAGNOSIS — R079 Chest pain, unspecified: Secondary | ICD-10-CM | POA: Diagnosis not present

## 2016-12-31 DIAGNOSIS — J111 Influenza due to unidentified influenza virus with other respiratory manifestations: Secondary | ICD-10-CM | POA: Diagnosis not present

## 2016-12-31 DIAGNOSIS — R0602 Shortness of breath: Secondary | ICD-10-CM | POA: Diagnosis not present

## 2016-12-31 DIAGNOSIS — R0789 Other chest pain: Secondary | ICD-10-CM | POA: Diagnosis not present

## 2016-12-31 DIAGNOSIS — Z79899 Other long term (current) drug therapy: Secondary | ICD-10-CM | POA: Insufficient documentation

## 2016-12-31 DIAGNOSIS — R05 Cough: Secondary | ICD-10-CM | POA: Diagnosis present

## 2016-12-31 DIAGNOSIS — R69 Illness, unspecified: Secondary | ICD-10-CM

## 2016-12-31 DIAGNOSIS — J90 Pleural effusion, not elsewhere classified: Secondary | ICD-10-CM | POA: Diagnosis not present

## 2016-12-31 LAB — COMPREHENSIVE METABOLIC PANEL
ALT: 23 U/L (ref 14–54)
AST: 30 U/L (ref 15–41)
Albumin: 4.2 g/dL (ref 3.5–5.0)
Alkaline Phosphatase: 76 U/L (ref 38–126)
Anion gap: 7 (ref 5–15)
BUN: 9 mg/dL (ref 6–20)
CO2: 28 mmol/L (ref 22–32)
Calcium: 8.7 mg/dL — ABNORMAL LOW (ref 8.9–10.3)
Chloride: 104 mmol/L (ref 101–111)
Creatinine, Ser: 0.62 mg/dL (ref 0.44–1.00)
GFR calc Af Amer: >60 mL/min (ref >60)
GFR calc non Af Amer: >60 mL/min (ref >60)
Glucose, Bld: 91 mg/dL (ref 65–99)
Potassium: 3.6 mmol/L (ref 3.5–5.1)
Sodium: 139 mmol/L (ref 135–145)
Total Bilirubin: 0.9 mg/dL (ref 0.3–1.2)
Total Protein: 7.5 g/dL (ref 6.5–8.1)

## 2016-12-31 LAB — CBC WITH DIFFERENTIAL/PLATELET
Basophils Absolute: 0 10*3/uL (ref 0.0–0.1)
Basophils Relative: 0 %
Eosinophils Absolute: 0 10*3/uL (ref 0.0–0.7)
Eosinophils Relative: 0 %
HCT: 37.9 % (ref 36.0–46.0)
Hemoglobin: 12.3 g/dL (ref 12.0–15.0)
Lymphocytes Relative: 17 %
Lymphs Abs: 1.6 10*3/uL (ref 0.7–4.0)
MCH: 27.5 pg (ref 26.0–34.0)
MCHC: 32.5 g/dL (ref 30.0–36.0)
MCV: 84.8 fL (ref 78.0–100.0)
Monocytes Absolute: 0.5 10*3/uL (ref 0.1–1.0)
Monocytes Relative: 6 %
Neutro Abs: 7.4 10*3/uL (ref 1.7–7.7)
Neutrophils Relative %: 77 %
Platelets: 318 10*3/uL (ref 150–400)
RBC: 4.47 MIL/uL (ref 3.87–5.11)
RDW: 14.3 % (ref 11.5–15.5)
WBC: 9.7 10*3/uL (ref 4.0–10.5)

## 2016-12-31 LAB — INFLUENZA PANEL BY PCR (TYPE A & B)
Influenza A By PCR: NEGATIVE
Influenza B By PCR: NEGATIVE

## 2016-12-31 LAB — URINALYSIS, ROUTINE W REFLEX MICROSCOPIC
Bilirubin Urine: NEGATIVE
Glucose, UA: NEGATIVE mg/dL
Hgb urine dipstick: NEGATIVE
Ketones, ur: NEGATIVE mg/dL
Leukocytes, UA: NEGATIVE
Nitrite: NEGATIVE
Protein, ur: NEGATIVE mg/dL
Specific Gravity, Urine: 1.005 (ref 1.005–1.030)
pH: 8 (ref 5.0–8.0)

## 2016-12-31 LAB — I-STAT TROPONIN, ED: Troponin i, poc: 0.01 ng/mL (ref 0.00–0.08)

## 2016-12-31 LAB — BRAIN NATRIURETIC PEPTIDE: B Natriuretic Peptide: 30.2 pg/mL (ref 0.0–100.0)

## 2016-12-31 MED ORDER — IOPAMIDOL (ISOVUE-370) INJECTION 76%
INTRAVENOUS | Status: AC
  Administered 2016-12-31: 100 mL via INTRAVENOUS
  Filled 2016-12-31: qty 100

## 2016-12-31 MED ORDER — HYDROCODONE-ACETAMINOPHEN 5-325 MG PO TABS
1.0000 | ORAL_TABLET | Freq: Once | ORAL | Status: AC
  Administered 2016-12-31: 1 via ORAL
  Filled 2016-12-31: qty 1

## 2016-12-31 MED ORDER — SODIUM CHLORIDE 0.9 % IV BOLUS (SEPSIS)
500.0000 mL | Freq: Once | INTRAVENOUS | Status: AC
  Administered 2016-12-31: 500 mL via INTRAVENOUS

## 2016-12-31 MED ORDER — IBUPROFEN 600 MG PO TABS: 600.0000 mg | ORAL_TABLET | Freq: Four times a day (QID) | ORAL | 0 refills | Status: DC | PRN

## 2016-12-31 MED ORDER — OSELTAMIVIR PHOSPHATE 75 MG PO CAPS: 75.0000 mg | ORAL_CAPSULE | Freq: Two times a day (BID) | ORAL | 0 refills | Status: DC

## 2016-12-31 MED ORDER — HYDROCODONE-ACETAMINOPHEN 5-325 MG PO TABS: 1.0000 | ORAL_TABLET | ORAL | 0 refills | Status: DC | PRN

## 2016-12-31 MED ORDER — HEPARIN SOD (PORK) LOCK FLUSH 100 UNIT/ML IV SOLN
500.0000 [IU] | Freq: Once | INTRAVENOUS | Status: AC
  Administered 2016-12-31: 500 [IU]
  Filled 2016-12-31: qty 5

## 2016-12-31 MED ORDER — OSELTAMIVIR PHOSPHATE 75 MG PO CAPS
75.0000 mg | ORAL_CAPSULE | Freq: Once | ORAL | Status: AC
  Administered 2016-12-31: 75 mg via ORAL
  Filled 2016-12-31: qty 1

## 2016-12-31 NOTE — ED Triage Notes (Signed)
Per son, states flu like symptoms for 2 days-chills, body aches-unsure of if she has had a fever-states sometimes hard to breath
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
## 2017-01-23 ENCOUNTER — Ambulatory Visit (HOSPITAL_BASED_OUTPATIENT_CLINIC_OR_DEPARTMENT_OTHER): Payer: PRIVATE HEALTH INSURANCE

## 2017-01-23 ENCOUNTER — Other Ambulatory Visit (HOSPITAL_BASED_OUTPATIENT_CLINIC_OR_DEPARTMENT_OTHER): Payer: PRIVATE HEALTH INSURANCE

## 2017-01-23 DIAGNOSIS — C3431 Malignant neoplasm of lower lobe, right bronchus or lung: Secondary | ICD-10-CM

## 2017-01-23 DIAGNOSIS — Z95828 Presence of other vascular implants and grafts: Secondary | ICD-10-CM

## 2017-01-23 DIAGNOSIS — L600 Ingrowing nail: Secondary | ICD-10-CM

## 2017-01-23 DIAGNOSIS — C3491 Malignant neoplasm of unspecified part of right bronchus or lung: Secondary | ICD-10-CM

## 2017-01-23 LAB — COMPREHENSIVE METABOLIC PANEL
ALT: 12 U/L (ref 0–55)
AST: 18 U/L (ref 5–34)
Albumin: 4 g/dL (ref 3.5–5.0)
Alkaline Phosphatase: 85 U/L (ref 40–150)
Anion Gap: 8 mEq/L (ref 3–11)
BUN: 13.1 mg/dL (ref 7.0–26.0)
CO2: 28 mEq/L (ref 22–29)
Calcium: 9.3 mg/dL (ref 8.4–10.4)
Chloride: 103 mEq/L (ref 98–109)
Creatinine: 0.7 mg/dL (ref 0.6–1.1)
EGFR: >90 mL/min/{1.73_m2} (ref >90)
Glucose: 112 mg/dl (ref 70–140)
Potassium: 3.8 mEq/L (ref 3.5–5.1)
Sodium: 140 mEq/L (ref 136–145)
Total Bilirubin: 0.62 mg/dL (ref 0.20–1.20)
Total Protein: 7.7 g/dL (ref 6.4–8.3)

## 2017-01-23 LAB — CBC WITH DIFFERENTIAL/PLATELET
BASO%: 0.2 % (ref 0.0–2.0)
Basophils Absolute: 0 10*3/uL (ref 0.0–0.1)
EOS%: 0.9 % (ref 0.0–7.0)
Eosinophils Absolute: 0.1 10*3/uL (ref 0.0–0.5)
HCT: 40.5 % (ref 34.8–46.6)
HGB: 13.2 g/dL (ref 11.6–15.9)
LYMPH%: 21.4 % (ref 14.0–49.7)
MCH: 28.1 pg (ref 25.1–34.0)
MCHC: 32.6 g/dL (ref 31.5–36.0)
MCV: 86.2 fL (ref 79.5–101.0)
MONO#: 0.5 10*3/uL (ref 0.1–0.9)
MONO%: 5.8 % (ref 0.0–14.0)
NEUT#: 5.8 10*3/uL (ref 1.5–6.5)
NEUT%: 71.7 % (ref 38.4–76.8)
Platelets: 301 10*3/uL (ref 145–400)
RBC: 4.7 10*6/uL (ref 3.70–5.45)
RDW: 14.4 % (ref 11.2–14.5)
WBC: 8.1 10*3/uL (ref 3.9–10.3)
lymph#: 1.7 10*3/uL (ref 0.9–3.3)

## 2017-01-23 MED ORDER — SODIUM CHLORIDE 0.9% FLUSH
10.0000 mL | INTRAVENOUS | Status: DC | PRN
  Administered 2017-01-23: 10 mL via INTRAVENOUS
  Filled 2017-01-23: qty 10

## 2017-01-23 MED ORDER — HEPARIN SOD (PORK) LOCK FLUSH 100 UNIT/ML IV SOLN
500.0000 [IU] | Freq: Once | INTRAVENOUS | Status: AC | PRN
  Administered 2017-01-23: 500 [IU] via INTRAVENOUS
  Filled 2017-01-23: qty 5

## 2017-01-30 ENCOUNTER — Ambulatory Visit (HOSPITAL_BASED_OUTPATIENT_CLINIC_OR_DEPARTMENT_OTHER): Payer: PRIVATE HEALTH INSURANCE | Admitting: Internal Medicine

## 2017-01-30 ENCOUNTER — Encounter: Payer: Self-pay | Admitting: Internal Medicine

## 2017-01-30 ENCOUNTER — Telehealth: Payer: Self-pay | Admitting: Internal Medicine

## 2017-01-30 VITALS — BP 122/71 | HR 88 | Temp 98.0°F | Resp 19 | Ht 59.0 in | Wt 126.7 lb

## 2017-01-30 DIAGNOSIS — R197 Diarrhea, unspecified: Secondary | ICD-10-CM | POA: Diagnosis not present

## 2017-01-30 DIAGNOSIS — Z95828 Presence of other vascular implants and grafts: Secondary | ICD-10-CM

## 2017-01-30 DIAGNOSIS — C3491 Malignant neoplasm of unspecified part of right bronchus or lung: Secondary | ICD-10-CM

## 2017-01-30 DIAGNOSIS — Z5111 Encounter for antineoplastic chemotherapy: Secondary | ICD-10-CM

## 2017-01-30 DIAGNOSIS — C3431 Malignant neoplasm of lower lobe, right bronchus or lung: Secondary | ICD-10-CM

## 2017-01-30 DIAGNOSIS — L600 Ingrowing nail: Secondary | ICD-10-CM

## 2017-01-30 MED ORDER — DOXYCYCLINE HYCLATE 100 MG PO TABS: 100.0000 mg | ORAL_TABLET | Freq: Two times a day (BID) | ORAL | 0 refills | Status: DC

## 2017-01-30 MED ORDER — HYDROCOD POLST-CPM POLST ER 10-8 MG/5ML PO SUER: 5.0000 mL | Freq: Two times a day (BID) | ORAL | 0 refills | Status: DC | PRN

## 2017-01-30 NOTE — Telephone Encounter (Signed)
Appointments scheduled per 3/5 LOS. Patient given AVS report and calendars with future scheduled appointments.

## 2017-01-30 NOTE — Progress Notes (Signed)
[facility] [phone]   [phone]  OFFICE PROGRESS NOTE  [name]., MD [place] [address]  PRINCIPAL DIAGNOSIS: Local recurrence of non-small cell lung cancer, adenocarcinoma, initially diagnosed as stage IB (T2a N0 M0) in March of 2010.   PRIOR THERAPY:  1. Status post right lower lobectomy under the care of Dr. Cyndia Bent on 04/08/2009. 2. Status post palliative radiotherapy to the right lower lobe recurrent lung mass under the care of Dr. Lisbeth Renshaw, completed on June 08, 2011. 3. Systemic chemotherapy with carboplatin for AUC of 5 and Alimta 500 mg/M2 every 3 weeks. She is status post 3 cycles.  CURRENT THERAPY: Tarceva 150 mg by mouth daily, therapy beginning 07/24/2012. Status post approximately 47 months of therapy.   CHEMOTHERAPY INTENT: Palliative  CURRENT # OF CHEMOTHERAPY CYCLES: 48 CURRENT ANTIEMETICS: Compazine  CURRENT SMOKING STATUS: Nonsmoker  ORAL CHEMOTHERAPY AND CONSENT: Tarceva  CURRENT BISPHOSPHONATES USE: None  PAIN MANAGEMENT: None  NARCOTICS INDUCED CONSTIPATION: None  LIVING WILL AND CODE STATUS: Full code  INTERVAL HISTORY: [name] 56 y.o. female came to the clinic today for follow-up visit accompanied by her interpreter. The patient is feeling fine today with no specific complaints except for the mild cough and few episodes of diarrhea. She also has some sores in her nose. She is requesting refill of doxycycline and cough medication. She has no significant weight loss or night sweats. She has no chest pain, shortness of breath or hemoptysis. She denied having any fever or chills. She has no nausea or vomiting. She was seen in the hospital recently for flulike symptoms but she did not require any admission. She is here today for evaluation and repeat blood work.  MEDICAL HISTORY: Past Medical History:  Diagnosis Date  . Encounter for antineoplastic chemotherapy 01/19/2016  . History  of radiation therapy 05/02/11 to 06/08/11   right lung  . Lung cancer ([place])    right lower lobe adenocarcinoma    ALLERGIES:  is allergic to codeine and doxycycline.  MEDICATIONS:  Current Outpatient Prescriptions  Medication Sig Dispense Refill  . albuterol (PROVENTIL HFA;VENTOLIN HFA) 108 (90 Base) MCG/ACT inhaler Inhale 2 puffs into the lungs every 6 (six) hours as needed for wheezing. 1 Inhaler 1  . chlorpheniramine-HYDROcodone (TUSSIONEX) 10-8 MG/5ML SUER Take 5 mLs by mouth every 12 (twelve) hours as needed for cough. 280 mL 0  . clindamycin (CLEOCIN T) 1 % lotion Apply topically 2 (two) times daily. 60 mL 0  . lidocaine-prilocaine (EMLA) cream Apply to Port-A-Cath prior to tx PRN 5 g prn 1 year  . omeprazole (PRILOSEC) 20 MG capsule Take 1 capsule (20 mg total) by mouth daily. 30 capsule 1  . TARCEVA 150 MG tablet Take 1 tablet ( 150 mg total )  by mouth daily 30 tablet 5  . doxycycline (VIBRA-TABS) 100 MG tablet Take 1 tablet (100 mg total) by mouth 2 (two) times daily. (Patient not taking: Reported on 12/27/2016) 14 tablet 0  . HYDROcodone-acetaminophen (NORCO) 5-325 MG tablet Take 1 tablet by mouth every 4 (four) hours as needed for severe pain. (Patient not taking: Reported on 01/30/2017) 10 tablet 0  . ibuprofen (ADVIL,MOTRIN) 600 MG tablet Take 1 tablet (600 mg total) by mouth every 6 (six) hours as needed. (Patient not taking: Reported on 01/30/2017) 30 tablet 0  . loperamide (IMODIUM) 2 MG capsule Take 2 mg by mouth as needed for diarrhea or loose stools.    Marland Kitchen  prochlorperazine (COMPAZINE) 10 MG tablet Take 1 tablet (10 mg total) by mouth every 6 (six) hours as needed for nausea or vomiting. (Patient not taking: Reported on 09/12/2016) 30 tablet 0   No current facility-administered medications for this visit.     SURGICAL HISTORY:  Past Surgical History:  Procedure Laterality Date  . LUNG LOBECTOMY  04/18/2009   RLL  . PORTACATH PLACEMENT  02/29/2012   Procedure: INSERTION  PORT-A-CATH;  Surgeon: Nicanor Alcon, MD;  Location: [facility];  Service: Thoracic;  Laterality: Left;  PowerPort 8 F attachable in left internal jugular.    REVIEW OF SYSTEMS:  A comprehensive review of systems was negative except for: Constitutional: positive for fatigue Respiratory: positive for cough Gastrointestinal: positive for diarrhea   PHYSICAL EXAMINATION: General appearance: alert, cooperative and no distress Head: Normocephalic, without obvious abnormality, atraumatic Neck: no adenopathy, no JVD, supple, symmetrical, trachea midline and thyroid not enlarged, symmetric, no tenderness/mass/nodules Lymph nodes: Cervical, supraclavicular, and axillary nodes normal. Resp: clear to auscultation bilaterally Back: symmetric, no curvature. ROM normal. No CVA tenderness. Cardio: regular rate and rhythm, S1, S2 normal, no murmur, click, rub or gallop GI: soft, non-tender; bowel sounds normal; no masses,  no organomegaly Extremities: extremities normal, atraumatic, no cyanosis or edema  ECOG PERFORMANCE STATUS: 1 - Symptomatic but completely ambulatory  Blood pressure 122/71, pulse 88, temperature 98 F (36.7 C), temperature source Oral, resp. rate 19, height '4\' 11"'$  (1.499 m), weight 126 lb 11.2 oz (57.5 kg), last menstrual period 02/10/2012, SpO2 100 %.  LABORATORY DATA: Lab Results  Component Value Date   WBC 8.1 01/23/2017   HGB 13.2 01/23/2017   HCT 40.5 01/23/2017   MCV 86.2 01/23/2017   PLT 301 01/23/2017      Chemistry      Component Value Date/Time   NA 140 01/23/2017 1006   K 3.8 01/23/2017 1006   CL 104 12/31/2016 1647   CL 102 04/30/2013 1002   CO2 28 01/23/2017 1006   BUN 13.1 01/23/2017 1006   CREATININE 0.7 01/23/2017 1006      Component Value Date/Time   CALCIUM 9.3 01/23/2017 1006   ALKPHOS 85 01/23/2017 1006   AST 18 01/23/2017 1006   ALT 12 01/23/2017 1006   BILITOT 0.62 01/23/2017 1006       RADIOGRAPHIC STUDIES: Ct Angio Chest Pe W And/or Wo  Contrast  Result Date: 12/31/2016 CLINICAL DATA:  Flu-like symptoms for several days with right-sided chest pain EXAM: CT ANGIOGRAPHY CHEST WITH CONTRAST TECHNIQUE: Multidetector CT imaging of the chest was performed using the standard protocol during bolus administration of intravenous contrast. Multiplanar CT image reconstructions and MIPs were obtained to evaluate the vascular anatomy. CONTRAST:  100 mL Isovue 370. COMPARISON:  12/31/2016 FINDINGS: Cardiovascular: Thoracic aorta shows atherosclerotic calcifications although no aneurysm or dissection is identified. No significant coronary calcifications are seen. The pulmonary artery demonstrates a normal branching pattern. No filling defect to suggest pulmonary embolism is identified. Mediastinum/Nodes: The thoracic inlet is within normal limits. No significant mediastinal adenopathy is noted. Lungs/Pleura: There are changes consistent with right lower lobectomy. Some stable consolidation and post radiation changes are noted on the right. A right-sided pleural effusion is seen which is stable from the prior exam. The previously seen 5 mm right upper lobe nodule is again identified and stable although less well visualized on the current exam due to slight differences in slice thickness. Upper Abdomen: Within normal limits. Musculoskeletal: Postsurgical changes are noted in the right chest wall.  No acute bony abnormality is seen. Review of the MIP images confirms the above findings. IMPRESSION: Postsurgical changes on the right. Stable consolidation in the right lung base with post radiation changes are noted. Stable right-sided pleural effusion is seen. No evidence of pulmonary emboli. Stable 5 mm right upper lobe nodule. Electronically Signed   By: Inez Catalina M.D.   On: 12/31/2016 20:05    ASSESSMENT AND PLAN:  This is a very pleasant 56 years old Asian female with recurrent non-small cell lung cancer, adenocarcinoma. She is currently on treatment with  Tarceva 150 mg by mouth daily status post 47 months of treatment. She is tolerating her treatment well with no significant adverse effect except for mild diarrhea and skin rash. I recommended for the patient to continue her treatment with Tarceva with the same dose. I will see her back for follow-up visit in one month's for reevaluation and repeat blood work. I gave the patient referral for doxycycline for the skin rash and soreness in her nose in addition to a refill of her cough medication. She was advised to call immediately if she has any concerning symptoms in the interval. The patient voices understanding of current disease status and treatment options and is in agreement with the current care plan.  All questions were answered. The patient knows to call the clinic with any problems, questions or concerns. We can certainly see the patient much sooner if necessary. I spent 10 minutes counseling the patient face to face. The total time spent in the appointment was 15 minutes.   Disclaimer: This note was dictated with voice recognition software. Similar sounding words can inadvertently be transcribed and may not be corrected upon review.

## 2017-02-27 ENCOUNTER — Other Ambulatory Visit (HOSPITAL_BASED_OUTPATIENT_CLINIC_OR_DEPARTMENT_OTHER): Payer: PRIVATE HEALTH INSURANCE

## 2017-02-27 ENCOUNTER — Telehealth: Payer: Self-pay | Admitting: Internal Medicine

## 2017-02-27 ENCOUNTER — Encounter: Payer: Self-pay | Admitting: Internal Medicine

## 2017-02-27 ENCOUNTER — Ambulatory Visit (HOSPITAL_BASED_OUTPATIENT_CLINIC_OR_DEPARTMENT_OTHER): Payer: PRIVATE HEALTH INSURANCE | Admitting: Internal Medicine

## 2017-02-27 VITALS — BP 134/79 | HR 92 | Temp 98.2°F | Resp 18 | Ht 59.0 in | Wt 124.7 lb

## 2017-02-27 DIAGNOSIS — L270 Generalized skin eruption due to drugs and medicaments taken internally: Secondary | ICD-10-CM | POA: Insufficient documentation

## 2017-02-27 DIAGNOSIS — R05 Cough: Secondary | ICD-10-CM

## 2017-02-27 DIAGNOSIS — C3431 Malignant neoplasm of lower lobe, right bronchus or lung: Secondary | ICD-10-CM

## 2017-02-27 DIAGNOSIS — Z5111 Encounter for antineoplastic chemotherapy: Secondary | ICD-10-CM

## 2017-02-27 HISTORY — DX: Generalized skin eruption due to drugs and medicaments taken internally: L27.0

## 2017-02-27 LAB — COMPREHENSIVE METABOLIC PANEL
ALT: 32 U/L (ref 0–55)
AST: 32 U/L (ref 5–34)
Albumin: 3.9 g/dL (ref 3.5–5.0)
Alkaline Phosphatase: 96 U/L (ref 40–150)
Anion Gap: 9 mEq/L (ref 3–11)
BUN: 10.3 mg/dL (ref 7.0–26.0)
CO2: 29 mEq/L (ref 22–29)
Calcium: 9.6 mg/dL (ref 8.4–10.4)
Chloride: 104 mEq/L (ref 98–109)
Creatinine: 0.7 mg/dL (ref 0.6–1.1)
EGFR: >90 mL/min/{1.73_m2} (ref >90)
Glucose: 95 mg/dl (ref 70–140)
Potassium: 4.4 mEq/L (ref 3.5–5.1)
Sodium: 142 mEq/L (ref 136–145)
Total Bilirubin: 0.73 mg/dL (ref 0.20–1.20)
Total Protein: 7.6 g/dL (ref 6.4–8.3)

## 2017-02-27 LAB — CBC WITH DIFFERENTIAL/PLATELET
BASO%: 0.2 % (ref 0.0–2.0)
Basophils Absolute: 0 10*3/uL (ref 0.0–0.1)
EOS%: 1.6 % (ref 0.0–7.0)
Eosinophils Absolute: 0.1 10*3/uL (ref 0.0–0.5)
HCT: 40.2 % (ref 34.8–46.6)
HGB: 12.9 g/dL (ref 11.6–15.9)
LYMPH%: 18.5 % (ref 14.0–49.7)
MCH: 28.3 pg (ref 25.1–34.0)
MCHC: 32.1 g/dL (ref 31.5–36.0)
MCV: 88.2 fL (ref 79.5–101.0)
MONO#: 0.6 10*3/uL (ref 0.1–0.9)
MONO%: 6.8 % (ref 0.0–14.0)
NEUT#: 6.6 10*3/uL — ABNORMAL HIGH (ref 1.5–6.5)
NEUT%: 72.9 % (ref 38.4–76.8)
Platelets: 303 10*3/uL (ref 145–400)
RBC: 4.56 10*6/uL (ref 3.70–5.45)
RDW: 14.3 % (ref 11.2–14.5)
WBC: 9 10*3/uL (ref 3.9–10.3)
lymph#: 1.7 10*3/uL (ref 0.9–3.3)

## 2017-02-27 MED ORDER — HYDROCORTISONE 1 % EX OINT: 1.0000 "application " | TOPICAL_OINTMENT | Freq: Two times a day (BID) | CUTANEOUS | 0 refills | Status: DC

## 2017-02-27 NOTE — Progress Notes (Signed)
[facility] [phone]   Fax:(336) [phone]  OFFICE PROGRESS NOTE  [name], MD [place] [address]  PRINCIPAL DIAGNOSIS: Local recurrence of non-small cell lung cancer, adenocarcinoma, initially diagnosed as stage IB (T2a N0 M0) in March of 2010.   PRIOR THERAPY:  1. Status post right lower lobectomy under the care of Dr. Cyndia Bent on 04/08/2009. 2. Status post palliative radiotherapy to the right lower lobe recurrent lung mass under the care of Dr. Lisbeth Renshaw, completed on June 08, 2011. 3. Systemic chemotherapy with carboplatin for AUC of 5 and Alimta 500 mg/M2 every 3 weeks. She is status post 3 cycles.  CURRENT THERAPY: Tarceva 150 mg by mouth daily, therapy beginning 07/24/2012. Status post approximately 48 months of therapy.   INTERVAL HISTORY: [name] 56 y.o. female returns to the clinic today for follow-up visit accompanied by her interpreter. The patient is feeling fine today with no specific complaints except for mild cough. She also has mild skin rash on the left forearm developed a few days ago. She denied having any chest pain, shortness breath or hemoptysis. She has no fever or chills. She has no significant weight loss or night sweats. Her diarrhea is well controlled with Imodium. She is here today for evaluation and repeat blood work.  MEDICAL HISTORY: Past Medical History:  Diagnosis Date  . Encounter for antineoplastic chemotherapy 01/19/2016  . History of radiation therapy 05/02/11 to 06/08/11   right lung  . Lung cancer ([place])    right lower lobe adenocarcinoma    ALLERGIES:  is allergic to codeine and doxycycline.  MEDICATIONS:  Current Outpatient Prescriptions  Medication Sig Dispense Refill  . chlorpheniramine-HYDROcodone (TUSSIONEX) 10-8 MG/5ML SUER Take 5 mLs by mouth every 12 (twelve) hours as needed for cough. 280 mL 0  . clindamycin (CLEOCIN T) 1 % lotion Apply topically 2 (two) times daily.  60 mL 0  . doxycycline (VIBRA-TABS) 100 MG tablet Take 1 tablet (100 mg total) by mouth 2 (two) times daily. 14 tablet 0  . ibuprofen (ADVIL,MOTRIN) 600 MG tablet Take 1 tablet (600 mg total) by mouth every 6 (six) hours as needed. 30 tablet 0  . lidocaine-prilocaine (EMLA) cream Apply to Port-A-Cath prior to tx PRN 5 g prn 1 year  . loperamide (IMODIUM) 2 MG capsule Take 2 mg by mouth as needed for diarrhea or loose stools.    Marland Kitchen omeprazole (PRILOSEC) 20 MG capsule Take 1 capsule (20 mg total) by mouth daily. 30 capsule 1  . TARCEVA 150 MG tablet Take 1 tablet ( 150 mg total )  by mouth daily 30 tablet 5  . albuterol (PROVENTIL HFA;VENTOLIN HFA) 108 (90 Base) MCG/ACT inhaler Inhale 2 puffs into the lungs every 6 (six) hours as needed for wheezing. (Patient not taking: Reported on 02/27/2017) 1 Inhaler 1  . HYDROcodone-acetaminophen (NORCO) 5-325 MG tablet Take 1 tablet by mouth every 4 (four) hours as needed for severe pain. (Patient not taking: Reported on 02/27/2017) 10 tablet 0  . prochlorperazine (COMPAZINE) 10 MG tablet Take 1 tablet (10 mg total) by mouth every 6 (six) hours as needed for nausea or vomiting. (Patient not taking: Reported on 02/27/2017) 30 tablet 0   No current facility-administered medications for this visit.     SURGICAL HISTORY:  Past Surgical History:  Procedure Laterality Date  . LUNG LOBECTOMY  04/18/2009   RLL  . PORTACATH PLACEMENT  02/29/2012   Procedure: INSERTION PORT-A-CATH;  Surgeon:  Nicanor Alcon, MD;  [facility];  Service: Thoracic;  Laterality: Left;  PowerPort 8 F attachable in left internal jugular.    REVIEW OF SYSTEMS:  A comprehensive review of systems was negative except for: Respiratory: positive for cough Integument/breast: positive for rash   PHYSICAL EXAMINATION: General appearance: alert, cooperative and no distress Head: Normocephalic, without obvious abnormality, atraumatic Neck: no adenopathy, no JVD, supple, symmetrical, trachea midline  and thyroid not enlarged, symmetric, no tenderness/mass/nodules Lymph nodes: Cervical, supraclavicular, and axillary nodes normal. Resp: clear to auscultation bilaterally Back: symmetric, no curvature. ROM normal. No CVA tenderness. Cardio: regular rate and rhythm, S1, S2 normal, no murmur, click, rub or gallop GI: soft, non-tender; bowel sounds normal; no masses,  no organomegaly Extremities: extremities normal, atraumatic, no cyanosis or edema  ECOG PERFORMANCE STATUS: 1 - Symptomatic but completely ambulatory  Blood pressure 134/79, pulse 92, temperature 98.2 F (36.8 C), temperature source Oral, resp. rate 18, height '4\' 11"'$  (1.499 m), weight 124 lb 11.2 oz (56.6 kg), last menstrual period 02/10/2012, SpO2 99 %.  LABORATORY DATA: Lab Results  Component Value Date   WBC 9.0 02/27/2017   HGB 12.9 02/27/2017   HCT 40.2 02/27/2017   MCV 88.2 02/27/2017   PLT 303 02/27/2017      Chemistry      Component Value Date/Time   NA 142 02/27/2017 0948   K 4.4 02/27/2017 0948   CL 104 12/31/2016 1647   CL 102 04/30/2013 1002   CO2 29 02/27/2017 0948   BUN 10.3 02/27/2017 0948   CREATININE 0.7 02/27/2017 0948      Component Value Date/Time   CALCIUM 9.6 02/27/2017 0948   ALKPHOS 96 02/27/2017 0948   AST 32 02/27/2017 0948   ALT 32 02/27/2017 0948   BILITOT 0.73 02/27/2017 0948       RADIOGRAPHIC STUDIES: No results found.  ASSESSMENT AND PLAN:  This is a very pleasant 56 years old Asian female with recurrent non-small cell lung cancer, adenocarcinoma status post surgical resection followed by previous chemotherapy with carboplatin and Alimta . She is currently on treatment with Tarceva 150 mg by mouth daily status post 48 months of treatment and she is tolerating the treatment well except for occasional diarrhea and skin rash. I recommended for the patient to continue her treatment with Tarceva with the same dose for now. For the skin rash, I will start the patient on  hydrocortisone cream 1% twice a day as needed. I will see her back for follow-up visit in one month's for reevaluation with repeat blood work as well as CT scan of the chest for restaging of her disease. She was advised to call immediately if she has any concerning symptoms in the interval. The patient voices understanding of current disease status and treatment options and is in agreement with the current care plan. All questions were answered. The patient knows to call the clinic with any problems, questions or concerns. We can certainly see the patient much sooner if necessary. I spent 10 minutes counseling the patient face to face. The total time spent in the appointment was 15 minutes.  Disclaimer: This note was dictated with voice recognition software. Similar sounding words can inadvertently be transcribed and may not be corrected upon review.

## 2017-02-27 NOTE — Telephone Encounter (Signed)
Gave patient AVS and calender per 02/27/2017 los. [facility] to contact patient with CT schedule.

## 2017-03-02 ENCOUNTER — Other Ambulatory Visit: Payer: Self-pay | Admitting: Internal Medicine

## 2017-03-02 DIAGNOSIS — C3431 Malignant neoplasm of lower lobe, right bronchus or lung: Secondary | ICD-10-CM

## 2017-03-02 DIAGNOSIS — C3491 Malignant neoplasm of unspecified part of right bronchus or lung: Secondary | ICD-10-CM

## 2017-03-02 DIAGNOSIS — Z95828 Presence of other vascular implants and grafts: Secondary | ICD-10-CM

## 2017-03-30 ENCOUNTER — Telehealth: Payer: Self-pay | Admitting: *Deleted

## 2017-03-30 NOTE — Telephone Encounter (Signed)
FYI "This is [employer] on behalf of my mother.  She thought she was to have scans before she sees Dr.[name]."  Current order for CT Chest W Contrast reads pending at this time.  Radiology schedulers awaiting authorization.  Provided Radiology scheduling number to call later today to allow time for insurance authorization.  "What about the appointment with Dr. Julien Nordmann if scan not performed?"   Will notify provider.

## 2017-03-31 ENCOUNTER — Other Ambulatory Visit: Payer: Self-pay

## 2017-04-03 ENCOUNTER — Telehealth: Payer: Self-pay | Admitting: Internal Medicine

## 2017-04-03 ENCOUNTER — Other Ambulatory Visit (HOSPITAL_BASED_OUTPATIENT_CLINIC_OR_DEPARTMENT_OTHER): Payer: Medicare Other

## 2017-04-03 ENCOUNTER — Encounter (HOSPITAL_COMMUNITY): Payer: Self-pay

## 2017-04-03 ENCOUNTER — Encounter: Payer: Self-pay | Admitting: Internal Medicine

## 2017-04-03 ENCOUNTER — Ambulatory Visit (HOSPITAL_BASED_OUTPATIENT_CLINIC_OR_DEPARTMENT_OTHER): Payer: PRIVATE HEALTH INSURANCE | Admitting: Internal Medicine

## 2017-04-03 ENCOUNTER — Ambulatory Visit (HOSPITAL_BASED_OUTPATIENT_CLINIC_OR_DEPARTMENT_OTHER): Payer: Medicare Other

## 2017-04-03 ENCOUNTER — Ambulatory Visit (HOSPITAL_COMMUNITY)
Admission: RE | Admit: 2017-04-03 | Discharge: 2017-04-03 | Disposition: A | Discharge status: Home / Self Care | Payer: Medicare Other | Source: Ambulatory Visit | Attending: Internal Medicine | Admitting: Internal Medicine

## 2017-04-03 ENCOUNTER — Encounter: Payer: Self-pay | Admitting: *Deleted

## 2017-04-03 VITALS — BP 132/97 | HR 125 | Temp 99.2°F | Resp 18 | Ht 59.0 in | Wt 123.1 lb

## 2017-04-03 DIAGNOSIS — C3491 Malignant neoplasm of unspecified part of right bronchus or lung: Secondary | ICD-10-CM

## 2017-04-03 DIAGNOSIS — I700 Atherosclerosis of aorta: Secondary | ICD-10-CM | POA: Insufficient documentation

## 2017-04-03 DIAGNOSIS — L270 Generalized skin eruption due to drugs and medicaments taken internally: Secondary | ICD-10-CM

## 2017-04-03 DIAGNOSIS — C3431 Malignant neoplasm of lower lobe, right bronchus or lung: Secondary | ICD-10-CM

## 2017-04-03 DIAGNOSIS — R21 Rash and other nonspecific skin eruption: Secondary | ICD-10-CM | POA: Diagnosis not present

## 2017-04-03 DIAGNOSIS — Z95828 Presence of other vascular implants and grafts: Secondary | ICD-10-CM

## 2017-04-03 DIAGNOSIS — Z5111 Encounter for antineoplastic chemotherapy: Secondary | ICD-10-CM

## 2017-04-03 DIAGNOSIS — K219 Gastro-esophageal reflux disease without esophagitis: Secondary | ICD-10-CM

## 2017-04-03 DIAGNOSIS — R000 Tachycardia, unspecified: Secondary | ICD-10-CM

## 2017-04-03 DIAGNOSIS — L600 Ingrowing nail: Secondary | ICD-10-CM

## 2017-04-03 DIAGNOSIS — C50911 Malignant neoplasm of unspecified site of right female breast: Secondary | ICD-10-CM | POA: Diagnosis not present

## 2017-04-03 LAB — CBC WITH DIFFERENTIAL/PLATELET
BASO%: 0.5 % (ref 0.0–2.0)
Basophils Absolute: 0 10*3/uL (ref 0.0–0.1)
EOS%: 1.8 % (ref 0.0–7.0)
Eosinophils Absolute: 0.1 10*3/uL (ref 0.0–0.5)
HCT: 40 % (ref 34.8–46.6)
HGB: 13.5 g/dL (ref 11.6–15.9)
LYMPH%: 18 % (ref 14.0–49.7)
MCH: 28.5 pg (ref 25.1–34.0)
MCHC: 33.6 g/dL (ref 31.5–36.0)
MCV: 84.9 fL (ref 79.5–101.0)
MONO#: 0.5 10*3/uL (ref 0.1–0.9)
MONO%: 7 % (ref 0.0–14.0)
NEUT#: 5.3 10*3/uL (ref 1.5–6.5)
NEUT%: 72.7 % (ref 38.4–76.8)
Platelets: 290 10*3/uL (ref 145–400)
RBC: 4.71 10*6/uL (ref 3.70–5.45)
RDW: 13.8 % (ref 11.2–14.5)
WBC: 7.3 10*3/uL (ref 3.9–10.3)
lymph#: 1.3 10*3/uL (ref 0.9–3.3)

## 2017-04-03 LAB — COMPREHENSIVE METABOLIC PANEL
ALT: 21 U/L (ref 0–55)
AST: 22 U/L (ref 5–34)
Albumin: 3.9 g/dL (ref 3.5–5.0)
Alkaline Phosphatase: 77 U/L (ref 40–150)
Anion Gap: 10 mEq/L (ref 3–11)
BUN: 13.1 mg/dL (ref 7.0–26.0)
CO2: 28 mEq/L (ref 22–29)
Calcium: 9 mg/dL (ref 8.4–10.4)
Chloride: 103 mEq/L (ref 98–109)
Creatinine: 0.6 mg/dL (ref 0.6–1.1)
EGFR: >90 mL/min/{1.73_m2} (ref >90)
Glucose: 105 mg/dl (ref 70–140)
Potassium: 3.9 mEq/L (ref 3.5–5.1)
Sodium: 141 mEq/L (ref 136–145)
Total Bilirubin: 0.76 mg/dL (ref 0.20–1.20)
Total Protein: 7.6 g/dL (ref 6.4–8.3)

## 2017-04-03 MED ORDER — HEPARIN SOD (PORK) LOCK FLUSH 100 UNIT/ML IV SOLN: 500.0000 [IU] | Freq: Once | INTRAVENOUS | Status: DC

## 2017-04-03 MED ORDER — IOPAMIDOL (ISOVUE-300) INJECTION 61%
75.0000 mL | Freq: Once | INTRAVENOUS | Status: AC | PRN
  Administered 2017-04-03: 75 mL via INTRAVENOUS

## 2017-04-03 MED ORDER — IOPAMIDOL (ISOVUE-300) INJECTION 61%
INTRAVENOUS | Status: AC
  Filled 2017-04-03: qty 75

## 2017-04-03 MED ORDER — HEPARIN SOD (PORK) LOCK FLUSH 100 UNIT/ML IV SOLN
500.0000 [IU] | Freq: Once | INTRAVENOUS | Status: AC | PRN
  Administered 2017-04-03: 500 [IU] via INTRAVENOUS
  Filled 2017-04-03: qty 5

## 2017-04-03 MED ORDER — HEPARIN SOD (PORK) LOCK FLUSH 100 UNIT/ML IV SOLN
INTRAVENOUS | Status: AC
  Administered 2017-04-03: 500 [IU]
  Filled 2017-04-03: qty 5

## 2017-04-03 MED ORDER — SODIUM CHLORIDE 0.9% FLUSH
10.0000 mL | INTRAVENOUS | Status: DC | PRN
  Administered 2017-04-03: 10 mL via INTRAVENOUS
  Filled 2017-04-03: qty 10

## 2017-04-03 MED ORDER — HYDROCOD POLST-CPM POLST ER 10-8 MG/5ML PO SUER: 5.0000 mL | Freq: Two times a day (BID) | ORAL | 0 refills | Status: DC | PRN

## 2017-04-03 NOTE — Progress Notes (Signed)
[facility] Telephone:(336) [phone]   Fax:(336) [place], MD [place] [address]  PRINCIPAL DIAGNOSIS: Local recurrence of non-small cell lung cancer, adenocarcinoma, initially diagnosed as stage IB (T2a N0 M0) in March of 2010.   PRIOR THERAPY:  1. Status post right lower lobectomy under the care of Dr. Cyndia Bent on 04/08/2009. 2. Status post palliative radiotherapy to the right lower lobe recurrent lung mass under the care of Dr. Lisbeth Renshaw, completed on June 08, 2011. 3. Systemic chemotherapy with carboplatin for AUC of 5 and Alimta 500 mg/M2 every 3 weeks. She is status post 3 cycles.  CURRENT THERAPY: Tarceva 150 mg by mouth daily, therapy beginning 07/24/2012. Status post approximately 49 months of therapy.   INTERVAL HISTORY: [name] 56 y.o. female returns to the clinic today for follow-up visit accompanied by her interpreter. The patient is feeling fine today with no specific complaints except for being anxious about her scan results. She denied having any chest pain or shortness of breath but continues to have dry cough and she is requesting refill of her cough medication. She has no hemoptysis. She also complains of tickling in her throat. The patient denied having any recent weight loss or night sweats. She has no nausea or vomiting. She has no fever or chills. She had repeat CT scan of the chest performed earlier today and she is here for evaluation and discussion of her scan results.  MEDICAL HISTORY: Past Medical History:  Diagnosis Date  . Drug-induced skin rash 02/27/2017  . Encounter for antineoplastic chemotherapy 01/19/2016  . History of radiation therapy 05/02/11 to 06/08/11   right lung  . Lung cancer ([place])    right lower lobe adenocarcinoma    ALLERGIES:  is allergic to codeine and doxycycline.  MEDICATIONS:  Current Outpatient Prescriptions  Medication Sig Dispense Refill  .  albuterol (PROVENTIL HFA;VENTOLIN HFA) 108 (90 Base) MCG/ACT inhaler Inhale 2 puffs into the lungs every 6 (six) hours as needed for wheezing. 1 Inhaler 1  . chlorpheniramine-HYDROcodone (TUSSIONEX) 10-8 MG/5ML SUER Take 5 mLs by mouth every 12 (twelve) hours as needed for cough. 280 mL 0  . clindamycin (CLEOCIN T) 1 % lotion Apply topically 2 (two) times daily. 60 mL 0  . HYDROcodone-acetaminophen (NORCO) 5-325 MG tablet Take 1 tablet by mouth every 4 (four) hours as needed for severe pain. 10 tablet 0  . hydrocortisone 1 % ointment Apply 1 application topically 2 (two) times daily. 30 g 0  . ibuprofen (ADVIL,MOTRIN) 600 MG tablet Take 1 tablet (600 mg total) by mouth every 6 (six) hours as needed. 30 tablet 0  . lidocaine-prilocaine (EMLA) cream Apply to Port-A-Cath prior to tx PRN 5 g prn 1 year  . loperamide (IMODIUM) 2 MG capsule Take 2 mg by mouth as needed for diarrhea or loose stools.    Marland Kitchen omeprazole (PRILOSEC) 20 MG capsule Take 1 capsule (20 mg total) by mouth daily. 30 capsule 1  . TARCEVA 150 MG tablet Take 1 tablet ( 150 mg total )  by mouth daily 30 tablet 10  . prochlorperazine (COMPAZINE) 10 MG tablet Take 1 tablet (10 mg total) by mouth every 6 (six) hours as needed for nausea or vomiting. (Patient not taking: Reported on 02/27/2017) 30 tablet 0   No current facility-administered medications for this visit.    Facility-Administered Medications Ordered in Other Visits  Medication Dose Route Frequency Provider Last Rate  Last Dose  . heparin lock flush 100 unit/mL  500 Units Intravenous Once Curt Bears, MD      . iopamidol (ISOVUE-300) 61 % injection             SURGICAL HISTORY:  Past Surgical History:  Procedure Laterality Date  . LUNG LOBECTOMY  04/18/2009   RLL  . PORTACATH PLACEMENT  02/29/2012   Procedure: INSERTION PORT-A-CATH;  Surgeon: Nicanor Alcon, MD;  Location: [facility];  Service: Thoracic;  Laterality: Left;  PowerPort 8 F attachable in left internal jugular.     REVIEW OF SYSTEMS:  Constitutional: negative Eyes: negative Ears, nose, mouth, throat, and face: positive for sore throat Respiratory: positive for cough Cardiovascular: negative Gastrointestinal: negative Genitourinary:negative Integument/breast: negative Hematologic/lymphatic: negative Musculoskeletal:negative Neurological: negative Behavioral/Psych: negative Endocrine: negative Allergic/Immunologic: negative   PHYSICAL EXAMINATION: General appearance: alert, cooperative and no distress Head: Normocephalic, without obvious abnormality, atraumatic Neck: no adenopathy, no JVD, supple, symmetrical, trachea midline and thyroid not enlarged, symmetric, no tenderness/mass/nodules Lymph nodes: Cervical, supraclavicular, and axillary nodes normal. Resp: clear to auscultation bilaterally Back: symmetric, no curvature. ROM normal. No CVA tenderness. Cardio: regular rate and rhythm, S1, S2 normal, no murmur, click, rub or gallop GI: soft, non-tender; bowel sounds normal; no masses,  no organomegaly Extremities: extremities normal, atraumatic, no cyanosis or edema Neurologic: Alert and oriented X 3, normal strength and tone. Normal symmetric reflexes. Normal coordination and gait  ECOG PERFORMANCE STATUS: 1 - Symptomatic but completely ambulatory  Blood pressure (!) 132/97, pulse (!) 125, temperature 99.2 F (37.3 C), temperature source Oral, resp. rate 18, height '4\' 11"'$  (1.499 m), weight 123 lb 1.6 oz (55.8 kg), last menstrual period 02/10/2012, SpO2 99 %.  LABORATORY DATA: Lab Results  Component Value Date   WBC 7.3 04/03/2017   HGB 13.5 04/03/2017   HCT 40.0 04/03/2017   MCV 84.9 04/03/2017   PLT 290 04/03/2017      Chemistry      Component Value Date/Time   NA 142 02/27/2017 0948   K 4.4 02/27/2017 0948   CL 104 12/31/2016 1647   CL 102 04/30/2013 1002   CO2 29 02/27/2017 0948   BUN 10.3 02/27/2017 0948   CREATININE 0.7 02/27/2017 0948      Component Value  Date/Time   CALCIUM 9.6 02/27/2017 0948   ALKPHOS 96 02/27/2017 0948   AST 32 02/27/2017 0948   ALT 32 02/27/2017 0948   BILITOT 0.73 02/27/2017 0948       RADIOGRAPHIC STUDIES: Ct Chest W Contrast  Result Date: 04/03/2017 CLINICAL DATA:  Right lung cancer. EXAM: CT CHEST WITH CONTRAST TECHNIQUE: Multidetector CT imaging of the chest was performed during intravenous contrast administration. CONTRAST:  42m ISOVUE-300 IOPAMIDOL (ISOVUE-300) INJECTION 61% COMPARISON:  12/31/2016 FINDINGS: Cardiovascular: Normal heart size. No pericardial effusion. Aortic atherosclerosis noted. Calcification and the LAD coronary artery noted. Mediastinum/Nodes: Normal appearance of the esophagus. No enlarged mediastinal or hilar lymph nodes. No supraclavicular or axillary adenopathy. Stable cardio phrenic node with a short axis of 7 mm. Lungs/Pleura: There is a moderate right pleural effusion. Unchanged from previous exam. Stable chronic paramediastinal masslike architectural distortion within the right lung compatible with changes of external beam radiation. Scar like density within the medial right upper lung adjacent to suture line is identified which also appears stable. No new pulmonary nodules or masses identified to suggest residual/ recurrence of disease. Upper Abdomen: No acute abnormality. Musculoskeletal: Chronic healed fracture of the left clavicle noted. Chronic right posterior rib deformities appears  similar to previous exam. IMPRESSION: 1. Stable post treatment changes involving the right hemithorax. 2. No specific findings identified to suggest residual or recurrent tumor. 3.  Aortic Atherosclerosis (ICD10-I70.0). Electronically Signed   By: Kerby Moors M.D.   On: 04/03/2017 08:57    ASSESSMENT AND PLAN:  This is a very pleasant 56 years old Asian female with recurrent non-small cell lung cancer, adenocarcinoma status post surgical resection followed by chemotherapy with carboplatin and Alimta and she  is currently on treatment with Tarceva 150 mg by mouth daily status post 49 months. She has been tolerating her treatment well with no significant adverse effects except for mild skin rash and occasional episodes of diarrhea. The patient had repeat CT scan of the chest performed earlier today. I personally and independently reviewed the scan and discuss the results with the patient and her interpreter. Her scan showed no evidence for disease progression. I recommended for the patient to continue her current treatment with Tarceva with the same dose. For the dry cough, I given a refill of Tussionex. For the tachycardia and slightly elevated blood pressure, the patient is very anxious about her scan results and this is most likely secondary to anxiety. For the skin rash, the patient will continue with hydrocortisone cream as needed. I will see her back for follow-up visit in one month for evaluation and repeat blood work. She was advised to call immediately if she has any concerning symptoms in the interval. The patient voices understanding of current disease status and treatment options and is in agreement with the current care plan. All questions were answered. The patient knows to call the clinic with any problems, questions or concerns. We can certainly see the patient much sooner if necessary.  Disclaimer: This note was dictated with voice recognition software. Similar sounding words can inadvertently be transcribed and may not be corrected upon review.

## 2017-04-03 NOTE — Progress Notes (Signed)
Oncology Nurse Navigator Documentation  Oncology Nurse Navigator Flowsheets 04/03/2017  Navigator Location [facility]  Navigator Encounter Type Clinic/MDC/I spoke with Ms. Hargrove today at clinic.  She is doing well and updated me on her upcoming trip out of the country.  I listened as the interpretor explained.  It is great seeing Ms. Nilsen today and so happy she is doing well.   Treatment Phase Other  Barriers/Navigation Needs No barriers at this time  Interventions None required  Acuity Level 1  Time Spent with Patient 15

## 2017-04-03 NOTE — Telephone Encounter (Signed)
Appointments scheduled per 5.7.18 LOS. Patient given AVS report and calendars with future scheduled appointments.

## 2017-04-05 ENCOUNTER — Encounter: Payer: Self-pay | Admitting: Internal Medicine

## 2017-04-05 ENCOUNTER — Telehealth: Payer: Self-pay | Admitting: Medical Oncology

## 2017-04-05 DIAGNOSIS — R05 Cough: Secondary | ICD-10-CM

## 2017-04-05 DIAGNOSIS — C3490 Malignant neoplasm of unspecified part of unspecified bronchus or lung: Secondary | ICD-10-CM

## 2017-04-05 DIAGNOSIS — R059 Cough, unspecified: Secondary | ICD-10-CM

## 2017-04-05 MED ORDER — HYDROCODONE-HOMATROPINE 5-1.5 MG/5ML PO SYRP: 5.0000 mL | ORAL_SOLUTION | Freq: Four times a day (QID) | ORAL | 0 refills | Status: DC | PRN

## 2017-04-05 NOTE — Telephone Encounter (Signed)
Hycodan called to pharmacy.

## 2017-04-05 NOTE — Telephone Encounter (Signed)
Daughter called an she is not sure what insurance her mother has, but they cannot afford out of pocket for tussionex cough syrup.Tussionex discontinued.  Requests alternative. Per Julien Nordmann he ordered hycodan and to get delysm to use if hycodan not effective.

## 2017-04-05 NOTE — Addendum Note (Signed)
Addended by: Ardeen Garland on: 04/05/2017 02:57 PM   Modules accepted: Orders

## 2017-04-05 NOTE — Progress Notes (Signed)
Received staff message from [place] to get PA for Tussinex. Called pharmacy to find out rx coverage ran through. Receive information from Maria'@[employer]'$ . Called provider listed([facility](Nicole) who states coverage showing termed on 03/27/17 and was through an employer. Called Medicare(Mark) was unable to locate patient in the system by name and date of birth or id number provided. Attempted to contact patient at both numbers with no success and left a message with my name and number for her to return my call. No ROI on file for [facility] to call anyone else for patient. If patient calls back, will need updated coverage information for rx. Called [facility] pharmacy([facility]) to find out cost for uninsured and was given $46.60.

## 2017-04-05 NOTE — Progress Notes (Signed)
Patient's daughter-Melissa Sinn returned call. She wasn't sure about Medicare but thought it was active because she states CT was approved through Medicare. She does not know about rx drug coverage. She would like to have rx filled at [facility] and will pay cash price for it to help with the cough an to havbe pharmacy call her at [phone] when ready. She had questions regarding Medicare. Advised her to leave message for Ander Purpura in social work. Advised her I can also mail a Medicaid application for them to complete.

## 2017-04-06 ENCOUNTER — Other Ambulatory Visit: Payer: Self-pay | Admitting: Medical Oncology

## 2017-04-06 ENCOUNTER — Telehealth: Payer: Self-pay | Admitting: Medical Oncology

## 2017-04-06 DIAGNOSIS — R05 Cough: Secondary | ICD-10-CM

## 2017-04-06 DIAGNOSIS — C3490 Malignant neoplasm of unspecified part of unspecified bronchus or lung: Secondary | ICD-10-CM

## 2017-04-06 DIAGNOSIS — R059 Cough, unspecified: Secondary | ICD-10-CM

## 2017-04-06 MED ORDER — HYDROCODONE-HOMATROPINE 5-1.5 MG/5ML PO SYRP: 5.0000 mL | ORAL_SOLUTION | Freq: Four times a day (QID) | ORAL | 0 refills | Status: DC | PRN

## 2017-04-06 NOTE — Telephone Encounter (Signed)
Left message to pick up rx at cancer center between [phone] with picture id.

## 2017-04-07 ENCOUNTER — Encounter: Payer: Self-pay | Admitting: *Deleted

## 2017-04-07 ENCOUNTER — Other Ambulatory Visit: Payer: Self-pay | Admitting: *Deleted

## 2017-04-07 MED ORDER — HYDROCOD POLST-CPM POLST ER 10-8 MG/5ML PO SUER: ORAL | 0 refills | Status: DC

## 2017-04-07 MED FILL — HYDROCODONE-CHLORPHENIRAM S: 10-8 | 14 days supply | Qty: 140 | Fill #0

## 2017-04-07 NOTE — Telephone Encounter (Signed)
Call received from pt's daugher, Lenna Sciara, stating need to resume the chlorpheniramine-hydrocodone per " other cough syrup is causing her to be too drowsy "  Note reason for change in above medication was due to cost concerns " but due to the side effects we would rather stay with the chlorpheniramine- hycodan ".  Prescription can be obtained today post scheduled visit with the Education officer, museum.

## 2017-04-07 NOTE — Progress Notes (Signed)
[facility] Work  Clinical Social Work received phone call from patient's daughter, concerned that patient does not have insurance coverage. Clinical Social Worker met with patient and daughter, Roslyn Smiling, to offer support and assess for needs.  CSW and patient/family called Social Security and was informed patient has had Medicare Part A & B effective October 2015. CSW assisted patient/family in contacting [place] office to apply for Medicare part D for prescription coverage.  Patient selected [employer] plan and it will be effective April 28, 2017. CSW explored forms of assistance such as Medicaid and EXTRA HELP.  Patient and daughter plan to acquire additional documents needed and will follow up with CSW.  Mrs. Curd reported feeling extremely overwhelmed to know she was insured and have a plan in place to cover medications.  Patient's daughter concerned about cost of Tarceva and copay coverage- CSW provided contact information for Stefanie Libel, financial advocate.    Maryjean Morn, MSW, LCSW, OSW-C Clinical Social Worker [facility] [phone]

## 2017-04-10 ENCOUNTER — Encounter: Payer: Self-pay | Admitting: Internal Medicine

## 2017-04-10 NOTE — Progress Notes (Signed)
Received email from [employer] approving patient for grant if withing income guidelines. Called patient's daughter Roslyn Smiling to advise and schedule an appointment. Left voicemail with my contact name and number. [facility] ROI is on file with Molisa"s name.

## 2017-04-10 NOTE — Progress Notes (Signed)
Patient's daughter Salvadore Dom returned my call. Advised her patient is eligible to apply for the one-time $400 [place] and what was needed. She will relay this information to her mom and call back to make an appointment to apply.

## 2017-04-26 ENCOUNTER — Telehealth: Payer: Self-pay | Admitting: Pharmacist

## 2017-04-26 NOTE — Telephone Encounter (Signed)
Oral Chemotherapy Pharmacist Encounter  Received call from patient's daughter, Lenna Sciara, today with questions about patient's prescription insurance coverage since her mom's old insurance termed on 03/27/17. Patient has enrolled in an [employer] Medicare prescription insurance plan which is to start on 04/28/17. Patient will likely have a high Tarceva copayment and assistance will be needed.There are currently no foundation grants available for patient's diagnosis.  Patient currently has 5 pills left.  [place] [employer] ([employer]) application completed and faxed to [phone].  This encounter will continue to be updated until final determination.  Oral Oncology Clinic will continue to follow.   Johny Drilling, PharmD, BCPS, BCOP 04/26/2017  2:46 PM Oral Oncology Clinic [phone]

## 2017-05-01 ENCOUNTER — Other Ambulatory Visit: Payer: Self-pay

## 2017-05-01 ENCOUNTER — Ambulatory Visit: Payer: Self-pay | Admitting: Internal Medicine

## 2017-05-01 NOTE — Telephone Encounter (Signed)
Oral Chemotherapy Pharmacist Encounter  Received notification from [place] that patient's Tarceva prescription would require prior authorization as patient's new prescription insurance coverage started 04/28/17 I called [employer] at [phone] to initiate PA Case ID: [id number] Status is pending.  Oral oncology Clinic will continue to follow.  Johny Drilling, PharmD, BCPS, BCOP 05/01/2017  3:26 PM Oral Oncology Clinic [phone]

## 2017-05-02 ENCOUNTER — Telehealth: Payer: Self-pay | Admitting: Medical Oncology

## 2017-05-02 NOTE — Telephone Encounter (Signed)
PA form for tarceva put in Managed care mailbox.

## 2017-05-03 NOTE — Telephone Encounter (Signed)
Oral Chemotherapy Pharmacist Encounter  Received notification from [place] that prior authorization for [place] has been approved Effective dates: 04/01/17-04/30/2020 Case ID: [id number]  I will fax PA approval to [place] at [phone]  This encounter will continue to be updated until final determination.  Oral Oncology Clinic will continue to follow.   Johny Drilling, PharmD, BCPS, BCOP 05/03/2017  2:11 PM Oral Oncology Clinic [phone]

## 2017-05-11 ENCOUNTER — Ambulatory Visit (HOSPITAL_BASED_OUTPATIENT_CLINIC_OR_DEPARTMENT_OTHER): Payer: PRIVATE HEALTH INSURANCE | Admitting: Internal Medicine

## 2017-05-11 ENCOUNTER — Telehealth: Payer: Self-pay | Admitting: Internal Medicine

## 2017-05-11 ENCOUNTER — Other Ambulatory Visit (HOSPITAL_BASED_OUTPATIENT_CLINIC_OR_DEPARTMENT_OTHER): Payer: PRIVATE HEALTH INSURANCE

## 2017-05-11 ENCOUNTER — Encounter: Payer: Self-pay | Admitting: Internal Medicine

## 2017-05-11 ENCOUNTER — Other Ambulatory Visit: Payer: Self-pay | Admitting: *Deleted

## 2017-05-11 VITALS — BP 126/94 | HR 77 | Temp 98.5°F | Resp 18 | Ht 59.0 in | Wt 124.5 lb

## 2017-05-11 DIAGNOSIS — C3431 Malignant neoplasm of lower lobe, right bronchus or lung: Secondary | ICD-10-CM

## 2017-05-11 DIAGNOSIS — C3490 Malignant neoplasm of unspecified part of unspecified bronchus or lung: Secondary | ICD-10-CM

## 2017-05-11 DIAGNOSIS — Z5111 Encounter for antineoplastic chemotherapy: Secondary | ICD-10-CM

## 2017-05-11 DIAGNOSIS — K219 Gastro-esophageal reflux disease without esophagitis: Secondary | ICD-10-CM

## 2017-05-11 DIAGNOSIS — R05 Cough: Secondary | ICD-10-CM | POA: Diagnosis not present

## 2017-05-11 DIAGNOSIS — R059 Cough, unspecified: Secondary | ICD-10-CM

## 2017-05-11 LAB — CBC WITH DIFFERENTIAL/PLATELET
BASO%: 0.4 % (ref 0.0–2.0)
Basophils Absolute: 0 10*3/uL (ref 0.0–0.1)
EOS%: 0.7 % (ref 0.0–7.0)
Eosinophils Absolute: 0.1 10*3/uL (ref 0.0–0.5)
HCT: 42.3 % (ref 34.8–46.6)
HGB: 14 g/dL (ref 11.6–15.9)
LYMPH%: 23.1 % (ref 14.0–49.7)
MCH: 28.2 pg (ref 25.1–34.0)
MCHC: 33.1 g/dL (ref 31.5–36.0)
MCV: 85.3 fL (ref 79.5–101.0)
MONO#: 0.6 10*3/uL (ref 0.1–0.9)
MONO%: 7.6 % (ref 0.0–14.0)
NEUT#: 5.3 10*3/uL (ref 1.5–6.5)
NEUT%: 68.2 % (ref 38.4–76.8)
Platelets: 319 10*3/uL (ref 145–400)
RBC: 4.96 10*6/uL (ref 3.70–5.45)
RDW: 14.3 % (ref 11.2–14.5)
WBC: 7.8 10*3/uL (ref 3.9–10.3)
lymph#: 1.8 10*3/uL (ref 0.9–3.3)

## 2017-05-11 LAB — COMPREHENSIVE METABOLIC PANEL
ALT: 27 U/L (ref 0–55)
AST: 34 U/L (ref 5–34)
Albumin: 3.8 g/dL (ref 3.5–5.0)
Alkaline Phosphatase: 107 U/L (ref 40–150)
Anion Gap: 10 mEq/L (ref 3–11)
BUN: 10.3 mg/dL (ref 7.0–26.0)
CO2: 30 mEq/L — ABNORMAL HIGH (ref 22–29)
Calcium: 9.8 mg/dL (ref 8.4–10.4)
Chloride: 104 mEq/L (ref 98–109)
Creatinine: 0.7 mg/dL (ref 0.6–1.1)
EGFR: >90 mL/min/{1.73_m2} (ref >90)
Glucose: 109 mg/dl (ref 70–140)
Potassium: 4.9 mEq/L (ref 3.5–5.1)
Sodium: 144 mEq/L (ref 136–145)
Total Bilirubin: 0.3 mg/dL (ref 0.20–1.20)
Total Protein: 7.9 g/dL (ref 6.4–8.3)

## 2017-05-11 MED ORDER — HYDROCODONE-HOMATROPINE 5-1.5 MG/5ML PO SYRP: 5.0000 mL | ORAL_SOLUTION | Freq: Four times a day (QID) | ORAL | 0 refills | Status: DC | PRN

## 2017-05-11 MED ORDER — HYDROCOD POLST-CPM POLST ER 10-8 MG/5ML PO SUER: ORAL | 0 refills | Status: DC

## 2017-05-11 MED FILL — HYDROCODONE-CHLORPHENIRAM S: 10-8 | 14 days supply | Qty: 140 | Fill #0

## 2017-05-11 NOTE — Progress Notes (Signed)
[facility] [phone]   Fax:([phone]) [place], [place] [address]  PRINCIPAL DIAGNOSIS: Local recurrence of non-small cell lung cancer, adenocarcinoma, initially diagnosed as stage IB (T2a N0 M0) in March of 2010.   PRIOR THERAPY:  1. Status post right lower lobectomy under the care of Dr. Cyndia Bent on 04/08/2009. 2. Status post palliative radiotherapy to the right lower lobe recurrent lung mass under the care of Dr. Lisbeth Renshaw, completed on June 08, 2011. 3. Systemic chemotherapy with carboplatin for AUC of 5 and Alimta 500 mg/M2 every 3 weeks. She is status post 3 cycles.  CURRENT THERAPY: Tarceva 150 mg by mouth daily, therapy beginning 07/24/2012. Status post approximately 50 months of therapy.   INTERVAL HISTORY: [name] 56 y.o. female returns to the clinic today for follow-up visit accompanied by her interpreter. The patient is feeling fine today with no specific complaints except for the persistent dry cough and occasional shortness of breath. She denied having any chest pain or hemoptysis. She denied having any recent weight loss or night sweats. She has no fever or chills. She denied having any significant nausea, vomiting, diarrhea or constipation. She has no significant skin rash. She has been tolerating her treatment was Tarceva fairly well but she has been off treatment for the last 2 weeks because of change in her insurance coverage. She is expected to receive the new shipment in the next few days. She is here today for evaluation and repeat blood work.  MEDICAL HISTORY: Past Medical History:  Diagnosis Date  . Drug-induced skin rash 02/27/2017  . Encounter for antineoplastic chemotherapy 01/19/2016  . History of radiation therapy 05/02/11 to 06/08/11   right lung  . Lung cancer ([place])    right lower lobe adenocarcinoma    ALLERGIES:  is allergic to codeine and  doxycycline.  MEDICATIONS:  Current Outpatient Prescriptions  Medication Sig Dispense Refill  . albuterol (PROVENTIL HFA;VENTOLIN HFA) 108 (90 Base) MCG/ACT inhaler Inhale 2 puffs into the lungs every 6 (six) hours as needed for wheezing. 1 Inhaler 1  . chlorpheniramine-HYDROcodone (TUSSIONEX) 10-8 MG/5ML SUER TAKE 5ML EVERY 12 HOURS AS NEEDED FOR COUGH 140 mL 0  . clindamycin (CLEOCIN T) 1 % lotion Apply topically 2 (two) times daily. 60 mL 0  . HYDROcodone-acetaminophen (NORCO) 5-325 MG tablet Take 1 tablet by mouth every 4 (four) hours as needed for severe pain. 10 tablet 0  . HYDROcodone-homatropine (HYCODAN) 5-1.5 MG/5ML syrup Take 5 mLs by mouth every 6 (six) hours as needed for cough. 120 mL 0  . hydrocortisone 1 % ointment Apply 1 application topically 2 (two) times daily. 30 g 0  . ibuprofen (ADVIL,MOTRIN) 600 MG tablet Take 1 tablet (600 mg total) by mouth every 6 (six) hours as needed. 30 tablet 0  . lidocaine-prilocaine (EMLA) cream Apply to Port-A-Cath prior to tx PRN 5 g prn 1 year  . loperamide (IMODIUM) 2 MG capsule Take 2 mg by mouth as needed for diarrhea or loose stools.    Marland Kitchen omeprazole (PRILOSEC) 20 MG capsule Take 1 capsule (20 mg total) by mouth daily. 30 capsule 1  . prochlorperazine (COMPAZINE) 10 MG tablet Take 1 tablet (10 mg total) by mouth every 6 (six) hours as needed for nausea or vomiting. (Patient not taking: Reported on 02/27/2017) 30 tablet 0  . TARCEVA 150 MG tablet Take 1 tablet ( 150 mg total )  by mouth  daily 30 tablet 10   No current facility-administered medications for this visit.     SURGICAL HISTORY:  Past Surgical History:  Procedure Laterality Date  . LUNG LOBECTOMY  04/18/2009   RLL  . PORTACATH PLACEMENT  02/29/2012   Procedure: INSERTION PORT-A-CATH;  Surgeon: Nicanor Alcon, MD;  Location: [facility];  Service: Thoracic;  Laterality: Left;  PowerPort 8 F attachable in left internal jugular.    REVIEW OF SYSTEMS:  Constitutional: positive for  fatigue Eyes: negative Ears, nose, mouth, throat, and face: positive for sore throat Respiratory: positive for cough and dyspnea on exertion Cardiovascular: negative Gastrointestinal: negative Genitourinary:negative Integument/breast: negative Hematologic/lymphatic: negative Musculoskeletal:negative Neurological: negative Behavioral/Psych: negative Endocrine: negative Allergic/Immunologic: negative   PHYSICAL EXAMINATION: General appearance: alert, cooperative, fatigued and no distress Head: Normocephalic, without obvious abnormality, atraumatic Neck: no adenopathy, no JVD, supple, symmetrical, trachea midline and thyroid not enlarged, symmetric, no tenderness/mass/nodules Lymph nodes: Cervical, supraclavicular, and axillary nodes normal. Resp: clear to auscultation bilaterally Back: symmetric, no curvature. ROM normal. No CVA tenderness. Cardio: regular rate and rhythm, S1, S2 normal, no murmur, click, rub or gallop GI: soft, non-tender; bowel sounds normal; no masses,  no organomegaly Extremities: extremities normal, atraumatic, no cyanosis or edema Neurologic: Alert and oriented X 3, normal strength and tone. Normal symmetric reflexes. Normal coordination and gait  ECOG PERFORMANCE STATUS: 1 - Symptomatic but completely ambulatory  Blood pressure (!) 126/94, pulse 77, temperature 98.5 F (36.9 C), temperature source Oral, resp. rate 18, height 4\' 11"  (1.499 m), weight 124 lb 8 oz (56.5 kg), last menstrual period 02/10/2012, SpO2 99 %.  LABORATORY DATA: Lab Results  Component Value Date   WBC 7.8 05/11/2017   HGB 14.0 05/11/2017   HCT 42.3 05/11/2017   MCV 85.3 05/11/2017   PLT 319 05/11/2017      Chemistry      Component Value Date/Time   NA 141 04/03/2017 0856   K 3.9 04/03/2017 0856   CL 104 12/31/2016 1647   CL 102 04/30/2013 1002   CO2 28 04/03/2017 0856   BUN 13.1 04/03/2017 0856   CREATININE 0.6 04/03/2017 0856      Component Value Date/Time   CALCIUM 9.0  04/03/2017 0856   ALKPHOS 77 04/03/2017 0856   AST 22 04/03/2017 0856   ALT 21 04/03/2017 0856   BILITOT 0.76 04/03/2017 0856       RADIOGRAPHIC STUDIES: No results found.  ASSESSMENT AND PLAN:  This is a very pleasant 56 years old Asian female with recurrent non-small cell lung cancer, adenocarcinoma status post surgical resection followed by systemic chemotherapy with carboplatin and Alimta followed by disease progression and the patient was started on treatment with Tarceva 150 mg by mouth daily status post 50 months of treatment. She has been tolerating the treatment well with no significant adverse effects. Her CBC is unremarkable today. I recommended for the patient to continue her current treatment with Tarceva with the same dose. For the dry cough she was given a refill of Hycodan. For the hypertension, I will refer the patient her primary care physician to establish care and management of blood pressure. She will come back for follow-up visit in one month's for reevaluation and repeat blood work. She was advised to call immediately if she has any concerning symptoms in the interval. The patient voices understanding of current disease status and treatment options and is in agreement with the current care plan. All questions were answered. The patient knows to call the clinic with  any problems, questions or concerns. We can certainly see the patient much sooner if necessary. I spent 15 minutes counseling the patient face to face. The total time spent in the appointment was 25 minutes.  Disclaimer: This note was dictated with voice recognition software. Similar sounding words can inadvertently be transcribed and may not be corrected upon review.

## 2017-05-11 NOTE — Telephone Encounter (Signed)
Appointments scheduled per 05/11/17 los. Patient was given a copy of the AVS report and appointment schedule, per 05/11/17 los.

## 2017-05-21 ENCOUNTER — Other Ambulatory Visit: Payer: Self-pay

## 2017-05-21 ENCOUNTER — Observation Stay (HOSPITAL_COMMUNITY)
Admission: EM | Admit: 2017-05-21 | Discharge: 2017-05-23 | Disposition: A | Discharge status: Home / Self Care | Payer: Medicare Other | Attending: Internal Medicine | Admitting: Internal Medicine

## 2017-05-21 ENCOUNTER — Emergency Department (HOSPITAL_COMMUNITY): Payer: Medicare Other

## 2017-05-21 ENCOUNTER — Encounter (HOSPITAL_COMMUNITY): Payer: Self-pay | Admitting: Emergency Medicine

## 2017-05-21 DIAGNOSIS — Z885 Allergy status to narcotic agent status: Secondary | ICD-10-CM | POA: Insufficient documentation

## 2017-05-21 DIAGNOSIS — I498 Other specified cardiac arrhythmias: Secondary | ICD-10-CM | POA: Diagnosis not present

## 2017-05-21 DIAGNOSIS — R0602 Shortness of breath: Secondary | ICD-10-CM | POA: Insufficient documentation

## 2017-05-21 DIAGNOSIS — J069 Acute upper respiratory infection, unspecified: Secondary | ICD-10-CM | POA: Diagnosis not present

## 2017-05-21 DIAGNOSIS — I499 Cardiac arrhythmia, unspecified: Secondary | ICD-10-CM | POA: Diagnosis not present

## 2017-05-21 DIAGNOSIS — Z881 Allergy status to other antibiotic agents status: Secondary | ICD-10-CM | POA: Diagnosis not present

## 2017-05-21 DIAGNOSIS — C3491 Malignant neoplasm of unspecified part of right bronchus or lung: Secondary | ICD-10-CM | POA: Diagnosis not present

## 2017-05-21 DIAGNOSIS — J029 Acute pharyngitis, unspecified: Secondary | ICD-10-CM | POA: Insufficient documentation

## 2017-05-21 DIAGNOSIS — Z923 Personal history of irradiation: Secondary | ICD-10-CM | POA: Insufficient documentation

## 2017-05-21 DIAGNOSIS — Z902 Acquired absence of lung [part of]: Secondary | ICD-10-CM | POA: Insufficient documentation

## 2017-05-21 DIAGNOSIS — I471 Supraventricular tachycardia, unspecified: Secondary | ICD-10-CM

## 2017-05-21 DIAGNOSIS — Z79899 Other long term (current) drug therapy: Secondary | ICD-10-CM | POA: Insufficient documentation

## 2017-05-21 DIAGNOSIS — I10 Essential (primary) hypertension: Secondary | ICD-10-CM

## 2017-05-21 DIAGNOSIS — R0600 Dyspnea, unspecified: Secondary | ICD-10-CM | POA: Diagnosis present

## 2017-05-21 DIAGNOSIS — I455 Other specified heart block: Secondary | ICD-10-CM

## 2017-05-21 DIAGNOSIS — I4891 Unspecified atrial fibrillation: Principal | ICD-10-CM | POA: Insufficient documentation

## 2017-05-21 DIAGNOSIS — R05 Cough: Secondary | ICD-10-CM | POA: Diagnosis not present

## 2017-05-21 DIAGNOSIS — C3431 Malignant neoplasm of lower lobe, right bronchus or lung: Secondary | ICD-10-CM | POA: Diagnosis not present

## 2017-05-21 LAB — CBC
HCT: 40.2 % (ref 36.0–46.0)
Hemoglobin: 13.4 g/dL (ref 12.0–15.0)
MCH: 28.9 pg (ref 26.0–34.0)
MCHC: 33.3 g/dL (ref 30.0–36.0)
MCV: 86.8 fL (ref 78.0–100.0)
Platelets: 283 10*3/uL (ref 150–400)
RBC: 4.63 MIL/uL (ref 3.87–5.11)
RDW: 14.4 % (ref 11.5–15.5)
WBC: 8.6 10*3/uL (ref 4.0–10.5)

## 2017-05-21 LAB — BASIC METABOLIC PANEL
Anion gap: 8 (ref 5–15)
BUN: 11 mg/dL (ref 6–20)
CO2: 27 mmol/L (ref 22–32)
Calcium: 9 mg/dL (ref 8.9–10.3)
Chloride: 103 mmol/L (ref 101–111)
Creatinine, Ser: 0.54 mg/dL (ref 0.44–1.00)
GFR calc Af Amer: >60 mL/min (ref >60)
GFR calc non Af Amer: >60 mL/min (ref >60)
Glucose, Bld: 94 mg/dL (ref 65–99)
Potassium: 4.4 mmol/L (ref 3.5–5.1)
Sodium: 138 mmol/L (ref 135–145)

## 2017-05-21 LAB — RAPID STREP SCREEN (MED CTR MEBANE ONLY): Streptococcus, Group A Screen (Direct): NEGATIVE

## 2017-05-21 LAB — MAGNESIUM: Magnesium: 2.1 mg/dL (ref 1.7–2.4)

## 2017-05-21 LAB — CG4 I-STAT (LACTIC ACID)
Lactic Acid, Venous: 1.34 mmol/L (ref 0.5–1.9)
Lactic Acid, Venous: 0.71 mmol/L (ref 0.5–1.9)

## 2017-05-21 LAB — POCT I-STAT TROPONIN I: Troponin i, poc: 0 ng/mL (ref 0.00–0.08)

## 2017-05-21 MED ORDER — POLYETHYLENE GLYCOL 3350 17 G PO PACK: 17.0000 g | PACK | Freq: Every day | ORAL | Status: DC | PRN

## 2017-05-21 MED ORDER — HYDROCORTISONE 1 % EX OINT
1.0000 "application " | TOPICAL_OINTMENT | Freq: Two times a day (BID) | CUTANEOUS | Status: DC
  Administered 2017-05-22 (×2): 1 via TOPICAL
  Filled 2017-05-21 (×2): qty 28.35

## 2017-05-21 MED ORDER — ENOXAPARIN SODIUM 40 MG/0.4ML ~~LOC~~ SOLN
40.0000 mg | SUBCUTANEOUS | Status: DC
  Administered 2017-05-22 – 2017-05-23 (×2): 40 mg via SUBCUTANEOUS
  Filled 2017-05-21 (×2): qty 0.4

## 2017-05-21 MED ORDER — HYDROCOD POLST-CPM POLST ER 10-8 MG/5ML PO SUER
5.0000 mL | Freq: Two times a day (BID) | ORAL | Status: DC | PRN
  Administered 2017-05-22 – 2017-05-23 (×2): 5 mL via ORAL
  Filled 2017-05-21 (×2): qty 5

## 2017-05-21 MED ORDER — HYDROCODONE-ACETAMINOPHEN 5-325 MG PO TABS: 1.0000 | ORAL_TABLET | ORAL | Status: DC | PRN

## 2017-05-21 MED ORDER — HYDRALAZINE HCL 20 MG/ML IJ SOLN
10.0000 mg | INTRAMUSCULAR | Status: DC | PRN
  Filled 2017-05-21: qty 0.5

## 2017-05-21 MED ORDER — SODIUM CHLORIDE 0.9 % IV BOLUS (SEPSIS)
1000.0000 mL | Freq: Once | INTRAVENOUS | Status: AC
  Administered 2017-05-21: 1000 mL via INTRAVENOUS

## 2017-05-21 MED ORDER — ONDANSETRON HCL 4 MG PO TABS: 4.0000 mg | ORAL_TABLET | Freq: Four times a day (QID) | ORAL | Status: DC | PRN

## 2017-05-21 MED ORDER — ONDANSETRON HCL 4 MG/2ML IJ SOLN: 4.0000 mg | Freq: Four times a day (QID) | INTRAMUSCULAR | Status: DC | PRN

## 2017-05-21 MED ORDER — ACETAMINOPHEN 650 MG RE SUPP: 650.0000 mg | Freq: Four times a day (QID) | RECTAL | Status: DC | PRN

## 2017-05-21 MED ORDER — ACETAMINOPHEN 325 MG PO TABS: 650.0000 mg | ORAL_TABLET | Freq: Four times a day (QID) | ORAL | Status: DC | PRN

## 2017-05-21 MED ORDER — IOPAMIDOL (ISOVUE-370) INJECTION 76%
INTRAVENOUS | Status: AC
  Filled 2017-05-21: qty 100

## 2017-05-21 MED ORDER — ERLOTINIB HCL 150 MG PO TABS
150.0000 mg | ORAL_TABLET | Freq: Every day | ORAL | Status: DC
  Administered 2017-05-22: 150 mg via ORAL
  Filled 2017-05-21 (×4): qty 1

## 2017-05-21 MED ORDER — IOPAMIDOL (ISOVUE-370) INJECTION 76%
100.0000 mL | Freq: Once | INTRAVENOUS | Status: AC | PRN
  Administered 2017-05-21: 60 mL via INTRAVENOUS

## 2017-05-21 MED ORDER — SODIUM CHLORIDE 0.9% FLUSH
3.0000 mL | Freq: Two times a day (BID) | INTRAVENOUS | Status: DC
  Administered 2017-05-22 (×2): 3 mL via INTRAVENOUS

## 2017-05-21 MED ORDER — SODIUM CHLORIDE 0.9 % IV SOLN
1.0000 g | Freq: Once | INTRAVENOUS | Status: AC
  Administered 2017-05-21: 1 g via INTRAVENOUS
  Filled 2017-05-21: qty 10

## 2017-05-21 MED ORDER — SODIUM CHLORIDE 0.9 % IV SOLN
INTRAVENOUS | Status: AC
  Administered 2017-05-21: via INTRAVENOUS

## 2017-05-21 NOTE — ED Notes (Signed)
Patient transported to CT 

## 2017-05-21 NOTE — ED Notes (Signed)
[facility] called, Report given to Rhohina, RN @ [facility].

## 2017-05-21 NOTE — ED Notes (Signed)
Pt has been having sinus arrhythmia with intermittent sinus pauses > 3 secs  and sinus tachycardia up to HR in the 140's BPM. Dr, Myrene Buddy has been made aware, and made reassessment.
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
## 2017-05-21 NOTE — ED Notes (Signed)
Attempted to give Report RN unavailable, will call back.

## 2017-05-21 NOTE — H&P (Signed)
History and Physical    [name] LNL:[id number] DOB: February 28, 1961 DOA: 05/21/2017  PCP: Curt Bears, MD   Patient coming from: Home  Chief Complaint: Dyspnea, sore throat  HPI: [name] is a 56 y.o. female with medical history significant for recurrent non-small cell lung cancer of the right lung status post lobectomy, radiation, and chemotherapy, now presenting to the emergency department for evaluation of dyspnea and sore throat for the past 3 days. Patient continues to follow with oncology, managed with Tarceva, and has been doing well until approximately 3 days ago when she noted the insidious development of dyspnea and sore throat. She denies any recent long distance travel or sick contacts, denies fevers per se, but does note intermittent chills. There has been a minimal cough associated with this and it has been occasionally productive of thick yellow sputum. She denies any chest pain or palpitations. She does describe some vague chest discomfort which is intermittent, fleeting, and associated with worsening dyspnea. She has not attempted any interventions for these complaints prior to coming in.  ED Course: Upon arrival to the ED, patient is found to be afebrile, saturating well on room air, tachycardic in the 130s initially, but with PACs and sinus pauses noted on the monitor. Blood pressure and respirations have remained stable. EKG features a sinus tachycardia with rate 137 and a second tracing reveals a sinus rhythm with sinus arrhythmia, and PACs. Chest x-ray is negative for acute cardiopulmonary disease. Chemistry panels unremarkable and CBC is entirely within the normal limits. Lactic acid is reassuring at 1.34, and troponin is undetectable. Patient was given a liter of normal saline and cardiology was consulted by the ED physician. Cardiology has kindly evaluated the patient and advise as a medical admission over to [facility] for EP consultation in the morning. Patient has  remained stable in terms of her blood pressure and respiratory status. She will be observed on the telemetry unit at [facility] for ongoing evaluation and management of dyspnea, sore throat, and arrhythmia with sinus pauses.   Interview performed with interpreter.  Review of Systems:  All other systems reviewed and apart from HPI, are negative.  Past Medical History:  Diagnosis Date  . Drug-induced skin rash 02/27/2017  . Encounter for antineoplastic chemotherapy 01/19/2016  . History of radiation therapy 05/02/11 to 06/08/11   right lung  . Lung cancer ([place])    right lower lobe adenocarcinoma    Past Surgical History:  Procedure Laterality Date  . LUNG LOBECTOMY  04/18/2009   RLL  . PORTACATH PLACEMENT  02/29/2012   Procedure: INSERTION PORT-A-CATH;  Surgeon: Nicanor Alcon, MD;  Location: [place];  Service: Thoracic;  Laterality: Left;  PowerPort 8 F attachable in left internal jugular.     reports that she has never smoked. She has never used smokeless tobacco. She reports that she does not drink alcohol or use drugs.  Allergies  Allergen Reactions  . Codeine Nausea Only  . Doxycycline Nausea And Vomiting    vomiting and headache    Family History  Problem Relation Age of Onset  . Heart Problems Mother   . Cancer Neg Hx      Prior to Admission medications   Medication Sig Start Date End Date Taking? Authorizing Provider  albuterol (PROVENTIL HFA;VENTOLIN HFA) 108 (90 Base) MCG/ACT inhaler Inhale 2 puffs into the lungs every 6 (six) hours as needed for wheezing. 08/08/16  Yes Curt Bears, MD  chlorpheniramine-HYDROcodone ([place]) 10-8 MG/5ML SUER TAKE  5ML EVERY 12 HOURS AS NEEDED FOR COUGH 05/11/17  Yes Curt Bears, MD  hydrocortisone 1 % ointment Apply 1 application topically 2 (two) times daily. 02/27/17  Yes Curt Bears, MD  TARCEVA 150 MG tablet Take 1 tablet ( 150 mg total )  by mouth daily 03/02/17  Yes Curt Bears, MD  lidocaine-prilocaine  (EMLA) cream Apply to Port-A-Cath prior to tx PRN Patient not taking: Reported on 05/21/2017 12/27/16   Curt Bears, MD    Physical Exam: Vitals:   05/21/17 1652 05/21/17 1930 05/21/17 2149  BP: 112/81  (!) 152/101  Pulse: (!) 139  88  Resp: 18  (!) 26  Temp: 98.4 F (36.9 C)    TempSrc: Oral    SpO2: 99% 98% 96%      Constitutional: No acute distress. Anxious. No pallor, no diaphoresis.  Eyes: PERTLA, lids and conjunctivae normal ENMT: Mucous membranes are moist. Posterior pharynx clear of any exudate or lesions.   Neck: normal, supple, no masses, no thyromegaly Respiratory: clear to auscultation bilaterally, no wheezing, no crackles. Normal respiratory effort.    Cardiovascular: S1 & S2 heard, regular rate and rhythm, no significant murmur. No extremity edema. No significant JVD. Abdomen: No distension, no tenderness, no masses palpated. Bowel sounds normal.  Musculoskeletal: no clubbing / cyanosis. No joint deformity upper and lower extremities.    Skin: no significant rashes, lesions, ulcers. Warm, dry, well-perfused. Neurologic: CN 2-12 grossly intact. Sensation intact, DTR normal. Strength 5/5 in all 4 limbs.  Psychiatric: Alert and oriented x 3. Anxious, pleasant, cooperative.     Labs on Admission: I have personally reviewed following labs and imaging studies  CBC:  Recent Labs Lab 05/21/17 1734  WBC 8.6  HGB 13.4  HCT 40.2  MCV 86.8  PLT 756   Basic Metabolic Panel:  Recent Labs Lab 05/21/17 1734 05/21/17 2044  NA 138  --   K 4.4  --   CL 103  --   CO2 27  --   GLUCOSE 94  --   BUN 11  --   CREATININE 0.54  --   CALCIUM 9.0  --   MG  --  2.1   GFR: Estimated Creatinine Clearance: 60.8 mL/min (by C-G formula based on SCr of 0.54 mg/dL). Liver Function Tests: No results for input(s): AST, ALT, ALKPHOS, BILITOT, PROT, ALBUMIN in the last 168 hours. No results for input(s): LIPASE, AMYLASE in the last 168 hours. No results for input(s):  AMMONIA in the last 168 hours. Coagulation Profile: No results for input(s): INR, PROTIME in the last 168 hours. Cardiac Enzymes: No results for input(s): CKTOTAL, CKMB, CKMBINDEX, TROPONINI in the last 168 hours. BNP (last 3 results) No results for input(s): PROBNP in the last 8760 hours. HbA1C: No results for input(s): HGBA1C in the last 72 hours. CBG: No results for input(s): GLUCAP in the last 168 hours. Lipid Profile: No results for input(s): CHOL, HDL, LDLCALC, TRIG, CHOLHDL, LDLDIRECT in the last 72 hours. Thyroid Function Tests: No results for input(s): TSH, T4TOTAL, FREET4, T3FREE, THYROIDAB in the last 72 hours. Anemia Panel: No results for input(s): VITAMINB12, FOLATE, FERRITIN, TIBC, IRON, RETICCTPCT in the last 72 hours. Urine analysis:    Component Value Date/Time   COLORURINE STRAW (A) 12/31/2016 1648   APPEARANCEUR CLEAR 12/31/2016 1648   LABSPEC 1.005 12/31/2016 1648   LABSPEC 1.010 04/21/2015 1047   PHURINE 8.0 12/31/2016 1648   GLUCOSEU NEGATIVE 12/31/2016 1648   GLUCOSEU Negative 04/21/2015 1047   HGBUR NEGATIVE  12/31/2016 1648   BILIRUBINUR NEGATIVE 12/31/2016 1648   BILIRUBINUR Negative 04/21/2015 [place] 12/31/2016 1648   PROTEINUR NEGATIVE 12/31/2016 1648   UROBILINOGEN 0.2 04/21/2015 1047   NITRITE NEGATIVE 12/31/2016 1648   LEUKOCYTESUR NEGATIVE 12/31/2016 1648   LEUKOCYTESUR Large 04/21/2015 1047   Sepsis Labs: @LABRCNTIP (procalcitonin:4,lacticidven:4) ) Recent Results (from the past 240 hour(s))  Rapid strep screen     Status: None   Collection Time: 05/21/17  6:50 PM  Result Value Ref Range Status   Streptococcus, Group A Screen (Direct) NEGATIVE NEGATIVE Final    Comment: (NOTE) A Rapid Antigen test may result negative if the antigen level in the sample is below the detection level of this test. The FDA has not cleared this test as a stand-alone test therefore the rapid antigen negative result has reflexed to a Group A  Strep culture.      Radiological Exams on Admission: Dg Chest 2 View  Result Date: 05/21/2017 CLINICAL DATA:  Pt c/o cp, burning throat pain, sob, chest and throat tightness w/productive cough x 1wk EXAM: CHEST  2 VIEW COMPARISON:  Chest x-ray dated 12/31/2016. Chest CT dated 04/03/2017. FINDINGS: Heart size is stable. Overall cardiomediastinal silhouette appears stable in size and configuration. Left chest wall Port-A-Cath is stable in position with tip at the level of the lower SVC/cavoatrial junction. Layering pleural effusion at the right lung base, moderate in size, stable amount compared to the appearance on recent chest CT. Dense opacity overlying the right heart border corresponds to the radiation change described on recent chest CT. Left lung remains clear. No pneumothorax. Osseous structures about the chest are unremarkable. IMPRESSION: 1. No acute findings. 2. Stable appearance of the pleural effusion and post radiation change within the right lung. Electronically Signed   By: Franki Cabot M.D.   On: 05/21/2017 17:56   Ct Angio Chest Pe W And/or Wo Contrast  Result Date: 05/21/2017 CLINICAL DATA:  Pt reports productive cough, and sore throat or the past 3 days. Has had non-productive cough for the past few years, but productive cough with chills is new. Also has feeling of SOB, but no obvious distress noted. No CP. EXAM: CT ANGIOGRAPHY CHEST WITH CONTRAST TECHNIQUE: Multidetector CT imaging of the chest was performed using the standard protocol during bolus administration of intravenous contrast. Multiplanar CT image reconstructions and MIPs were obtained to evaluate the vascular anatomy. CONTRAST:  60 mL of Isovue 370 intravenous contrast COMPARISON:  Current chest radiograph.  Chest CT, 04/03/2017 FINDINGS: Cardiovascular: There is satisfactory opacification of the pulmonary arteries. Respiratory motion somewhat degrades images particularly the segmental and subsegmental branches to the  lower lobes. Allowing for the respiratory motion, there is no evidence of a pulmonary embolism. The aorta is normal in caliber. No aortic dissection. No significant atherosclerosis. Heart is normal in size. No discrete coronary artery calcifications. Mediastinum/Nodes: No neck base or axillary masses or adenopathy. No discrete mediastinal or hilar masses or enlarged lymph nodes. Lungs/Pleura: There is soft tissue surrounding the right hilar structures, stable from the prior CT consistent with post surgical and treatment related changes. There is a small right pleural effusion has also stable. No left pleural effusion. Irregular scarring in the right upper lobe posteriorly is stable. The degree of volume loss on the right is unchanged. Left lung is relatively hyperexpanded. Left lung is clear. No pneumothorax. Upper Abdomen: No acute abnormality. Musculoskeletal: No fracture or acute finding. No osteoblastic or osteolytic lesions. Review of the MIP images confirms  the above findings. IMPRESSION: 1. No evidence of a pulmonary embolism. 2. Postsurgical and treatment related changes on the right, as well as a small right pleural effusion, are stable when compared to the prior CT. There is no convincing recurrent lung carcinoma or metastatic disease. 3. No evidence of pneumonia or pulmonary edema. No emphysema.  No aortic atherosclerosis. Electronically Signed   By: Lajean Manes M.D.   On: 05/21/2017 20:55    EKG: Independently reviewed. Sinus rhythm, PAC's, sinus pause.   Assessment/Plan  1. Sinus arrhythmia  - Pt presents with dyspnea, sore throat, chills, suggestive of acute viral URI, but with recurrent fleeting episodes of acute dyspnea that correspond with sinus pauses on the cardiac monitor  - Cardiology is consulting and much appreciated; there is concern for possible tumor invasion into pericardium; advised to admit to [facility] for EP eval in am, keep K >4, Mg >2, and Ca >9, check TSH, continue cardiac  monitoring, repeat EKG's prn rhythm change or chest pain    2. Dyspnea, sore throat  - Pt reports 3 days of sore throat, SOB, productive cough  - No fever or leukocytosis, lactate wnl  - CTA negative for PE or acute cardiopulmonary disease, notable for stable small right pleural effusion and stable post-radiation changes  - Strep negative in ED, resp virus panel pending  - Supportive care with cough syrup, prn supplemental O2    3. Recurrent NCSLC  - Pt continues to follow with oncology and is managed with daily Tarceva, will continue  - Status-post right lower lobectomy in 2010, radiation to recurrent RLL mass in 2012, chemo, now on Tarceva since August '13    DVT prophylaxis: sq Lovenox Code Status: Full  Family Communication: Husband updated at bedside Disposition Plan: Observe on telemetry at [facility] Consults called: Cardiology Admission status: Observation    Vianne Bulls, MD [facility] Pager [phone]  If 7PM-7AM, please contact night-coverage [email or web address] Password [facility]  05/21/2017, 10:28 PM

## 2017-05-21 NOTE — ED Notes (Signed)
Pt is a alert and oriented x 4 and is verbally responsive. Pt is accompanied with her son. Pt denies pain at this time.

## 2017-05-21 NOTE — Consult Note (Signed)
CARDIOLOGY INPATIENT CONSULTATION NOTE  Patient ID: [name] MRN: [id number], DOB/AGE: February 03, 1961   Admit date: 05/21/2017   Primary Physician: Curt Bears, MD Primary Cardiologist: new  Reason for Consult:   Sinus pauses and SVT  Requesting Physician: Duffy Bruce MD  HPI: This is a 56 y.o. female with no prior history of arrhythmia presented to [facility] with SOB. Patient has significant history of NSCLC of the right hilar area with recent recurrence on palliative chemo/radiotherapy with Alimta and carboplatin.  Patient was found to have URI symptoms on presentation and was found to have asymptomatic sinus pauses up to 4s. She did feel some SOB when the sinus pauses occur. She continues to be in sinus arrhythmia. She denied syncope, dizziness, falls, presyncope otherwise. She was asked to be transferred to [facility] for overnight telemetry monitoring. Here she had a longest pause of 4.2 s which occurred after conversion to sinus rhythm from SVT or ectopic atrial tachycardia.  Interpretor was used to communicate with the patient.   Problem List: Past Medical History:  Diagnosis Date  . Drug-induced skin rash 02/27/2017  . Encounter for antineoplastic chemotherapy 01/19/2016  . History of radiation therapy 05/02/11 to 06/08/11   right lung  . Lung cancer ([place])    right lower lobe adenocarcinoma    Past Surgical History:  Procedure Laterality Date  . LUNG LOBECTOMY  04/18/2009   RLL  . PORTACATH PLACEMENT  02/29/2012   Procedure: INSERTION PORT-A-CATH;  Surgeon: Nicanor Alcon, MD;  Location: [place];  Service: Thoracic;  Laterality: Left;  PowerPort 8 F attachable in left internal jugular.     Allergies:  Allergies  Allergen Reactions  . Codeine Nausea Only  . Doxycycline Nausea And Vomiting    vomiting and headache     Home Medications Current Facility-Administered Medications  Medication Dose Route Frequency Provider Last Rate Last Dose  . iopamidol  (ISOVUE-370) 76 % injection           . iopamidol (ISOVUE-370) 76 % injection            Current Outpatient Prescriptions  Medication Sig Dispense Refill  . albuterol (PROVENTIL HFA;VENTOLIN HFA) 108 (90 Base) MCG/ACT inhaler Inhale 2 puffs into the lungs every 6 (six) hours as needed for wheezing. 1 Inhaler 1  . chlorpheniramine-HYDROcodone (TUSSIONEX) 10-8 MG/5ML SUER TAKE 5ML EVERY 12 HOURS AS NEEDED FOR COUGH 140 mL 0  . hydrocortisone 1 % ointment Apply 1 application topically 2 (two) times daily. 30 g 0  . TARCEVA 150 MG tablet Take 1 tablet ( 150 mg total )  by mouth daily 30 tablet 10  . clindamycin (CLEOCIN T) 1 % lotion Apply topically 2 (two) times daily. (Patient not taking: Reported on 05/21/2017) 60 mL 0  . HYDROcodone-acetaminophen (NORCO) 5-325 MG tablet Take 1 tablet by mouth every 4 (four) hours as needed for severe pain. (Patient not taking: Reported on 05/21/2017) 10 tablet 0  . ibuprofen (ADVIL,MOTRIN) 600 MG tablet Take 1 tablet (600 mg total) by mouth every 6 (six) hours as needed. (Patient not taking: Reported on 05/21/2017) 30 tablet 0  . lidocaine-prilocaine (EMLA) cream Apply to Port-A-Cath prior to tx PRN (Patient not taking: Reported on 05/21/2017) 5 g prn 1 year  . omeprazole (PRILOSEC) 20 MG capsule Take 1 capsule (20 mg total) by mouth daily. (Patient not taking: Reported on 05/21/2017) 30 capsule 1  . prochlorperazine (COMPAZINE) 10 MG tablet Take 1 tablet (10 mg total) by mouth every  6 (six) hours as needed for nausea or vomiting. (Patient not taking: Reported on 02/27/2017) 30 tablet 0     Family History  Problem Relation Age of Onset  . Heart Problems Mother   . Cancer Neg Hx      Social History   Social History  . Marital status: Married    Spouse name: N/A  . Number of children: 7  . Years of education: N/A   Occupational History  . Not on file.   Social History Main Topics  . Smoking status: Never Smoker  . Smokeless tobacco: Never Used  .  Alcohol use No  . Drug use: No  . Sexual activity: Yes   Other Topics Concern  . Not on file   Social History Narrative   09/01/2014   The patient is a 56 year old [place] woman.   The patient is married and has 7 children. One child passed away.   The patient came to the [place] in approximately 1990. They were in [place] for 10-11 years and then moved to [place], [place] since then.   Patient does not smoke, drink, or use illicit drugs.   Patient has not used smokeless tobacco products.     Review of Systems: General: fatigue  Cardiovascular: dyspnea  Dermatological: negative for rash Respiratory: dyspnea  Urologic: negative for hematuria Abdominal: negative for nausea, vomiting, diarrhea, bright red blood per rectum, melena, or hematemesis Neurologic: due to dyspnea had bilateral upper and lower extremity numbness/tingling, no seizures Hematology: mild anemia Psychiatry: non suicidal/homicidal  Musculoskeletal: no leg swelling  Physical Exam: Vitals: BP 112/81 (BP Location: Right Arm)   Pulse (!) 139   Temp 98.4 F (36.9 C) (Oral)   Resp 18   LMP 02/10/2012   SpO2 98%  General: not in acute distress Neck: JVP flat, neck supple Heart: irregular rate and rhythm, S1, S2, no murmurs  Lungs: CTAB  GI: non tender, non distended, bowel sounds present Extremities: no edema Neuro: AAO x 3  Psych: normal affect, no anxiety   Labs:   Results for orders placed or performed during the hospital encounter of 05/21/17 (from the past 24 hour(s))  Basic metabolic panel     Status: None   Collection Time: 05/21/17  5:34 PM  Result Value Ref Range   Sodium 138 135 - 145 mmol/L   Potassium 4.4 3.5 - 5.1 mmol/L   Chloride 103 101 - 111 mmol/L   CO2 27 22 - 32 mmol/L   Glucose, Bld 94 65 - 99 mg/dL   BUN 11 6 - 20 mg/dL   Creatinine, Ser 0.54 0.44 - 1.00 mg/dL   Calcium 9.0 8.9 - 10.3 mg/dL   GFR calc non Af Amer >60 >60 mL/min   GFR calc Af Amer >60 >60  mL/min   Anion gap 8 5 - 15  CBC     Status: None   Collection Time: 05/21/17  5:34 PM  Result Value Ref Range   WBC 8.6 4.0 - 10.5 K/uL   RBC 4.63 3.87 - 5.11 MIL/uL   Hemoglobin 13.4 12.0 - 15.0 g/dL   HCT 40.2 36.0 - 46.0 %   MCV 86.8 78.0 - 100.0 fL   MCH 28.9 26.0 - 34.0 pg   MCHC 33.3 30.0 - 36.0 g/dL   RDW 14.4 11.5 - 15.5 %   Platelets 283 150 - 400 K/uL  POCT i-Stat troponin I     Status: None   Collection Time: 05/21/17  5:44  PM  Result Value Ref Range   Troponin i, poc 0.00 0.00 - 0.08 ng/mL   Comment 3          Rapid strep screen     Status: None   Collection Time: 05/21/17  6:50 PM  Result Value Ref Range   Streptococcus, Group A Screen (Direct) NEGATIVE NEGATIVE  CG4 I-STAT (Lactic acid)     Status: None   Collection Time: 05/21/17  7:01 PM  Result Value Ref Range   Lactic Acid, Venous 1.34 0.5 - 1.9 mmol/L     Radiology/Studies: Dg Chest 2 View  Result Date: 05/21/2017 CLINICAL DATA:  Pt c/o cp, burning throat pain, sob, chest and throat tightness w/productive cough x 1wk EXAM: CHEST  2 VIEW COMPARISON:  Chest x-ray dated 12/31/2016. Chest CT dated 04/03/2017. FINDINGS: Heart size is stable. Overall cardiomediastinal silhouette appears stable in size and configuration. Left chest wall Port-A-Cath is stable in position with tip at the level of the lower SVC/cavoatrial junction. Layering pleural effusion at the right lung base, moderate in size, stable amount compared to the appearance on recent chest CT. Dense opacity overlying the right heart border corresponds to the radiation change described on recent chest CT. Left lung remains clear. No pneumothorax. Osseous structures about the chest are unremarkable. IMPRESSION: 1. No acute findings. 2. Stable appearance of the pleural effusion and post radiation change within the right lung. Electronically Signed   By: Franki Cabot M.D.   On: 05/21/2017 17:56   Ct Angio Chest Pe W And/or Wo Contrast  Result Date:  05/21/2017 CLINICAL DATA:  Pt reports productive cough, and sore throat or the past 3 days. Has had non-productive cough for the past few years, but productive cough with chills is new. Also has feeling of SOB, but no obvious distress noted. No CP. EXAM: CT ANGIOGRAPHY CHEST WITH CONTRAST TECHNIQUE: Multidetector CT imaging of the chest was performed using the standard protocol during bolus administration of intravenous contrast. Multiplanar CT image reconstructions and MIPs were obtained to evaluate the vascular anatomy. CONTRAST:  60 mL of Isovue 370 intravenous contrast COMPARISON:  Current chest radiograph.  Chest CT, 04/03/2017 FINDINGS: Cardiovascular: There is satisfactory opacification of the pulmonary arteries. Respiratory motion somewhat degrades images particularly the segmental and subsegmental branches to the lower lobes. Allowing for the respiratory motion, there is no evidence of a pulmonary embolism. The aorta is normal in caliber. No aortic dissection. No significant atherosclerosis. Heart is normal in size. No discrete coronary artery calcifications. Mediastinum/Nodes: No neck base or axillary masses or adenopathy. No discrete mediastinal or hilar masses or enlarged lymph nodes. Lungs/Pleura: There is soft tissue surrounding the right hilar structures, stable from the prior CT consistent with post surgical and treatment related changes. There is a small right pleural effusion has also stable. No left pleural effusion. Irregular scarring in the right upper lobe posteriorly is stable. The degree of volume loss on the right is unchanged. Left lung is relatively hyperexpanded. Left lung is clear. No pneumothorax. Upper Abdomen: No acute abnormality. Musculoskeletal: No fracture or acute finding. No osteoblastic or osteolytic lesions. Review of the MIP images confirms the above findings. IMPRESSION: 1. No evidence of a pulmonary embolism. 2. Postsurgical and treatment related changes on the right, as  well as a small right pleural effusion, are stable when compared to the prior CT. There is no convincing recurrent lung carcinoma or metastatic disease. 3. No evidence of pneumonia or pulmonary edema. No emphysema.  No aortic  atherosclerosis. Electronically Signed   By: Lajean Manes M.D.   On: 05/21/2017 20:55    EKG: normal sinus rhythm with PACs  Echo: pending  Medical decision making:  Discussed care with the patient and the family  Discussed care with the physician on the phone Reviewed labs and imaging personally Reviewed prior records  ASSESSMENT AND PLAN:  This is a 56 y.o. female with recurrence of non small cell lung cancer possibly invading pericardium presented with sinus pauses of 4s.    Principal Problem:   Sinus arrhythmia seen on electrocardiogram Active Problems:   Primary cancer of right lower lobe of lung (HCC)   Dyspnea   Acute upper respiratory infection  Post conversion asymptomatic sinus pauses in the setting of supraventricular tachycardia Patient develops SVT and has post conversion pauses. Longest occurred at 8:30 PM on 05/21/2017. She is not on beta blockers and this occurred in the setting possible URI infection and possible invasion of pericardium with NSCLC PLAN:  - conservative management. Monitor on tele, Keep potassium >4, calcium >9, magnesium >2. Check TSH,  EP consult in the AM to discuss need to suppress SVT with antiarrhythmic vs. need for permanent pacemaker. Echocardiogram to evaluate for pericardial effusion. Observe on telemetry.    Shireen Quan, MD MS 05/21/2017, 9:17 PM

## 2017-05-21 NOTE — ED Triage Notes (Addendum)
Pt reports productive cough, and sore throat or the past 3 days. Has had non-productive cough for the past few years, but productive cough with chills is new. Also has feeling of SOB, but no obvious distress noted. No CP. Endorses dizziness and lightheadedness upon asking. Hx of lung cancer. On home chemo pill, last dose this am. Triage completed via [place] interpreter

## 2017-05-22 ENCOUNTER — Observation Stay (HOSPITAL_BASED_OUTPATIENT_CLINIC_OR_DEPARTMENT_OTHER): Payer: Medicare Other

## 2017-05-22 ENCOUNTER — Encounter (HOSPITAL_COMMUNITY): Payer: Self-pay | Admitting: General Practice

## 2017-05-22 DIAGNOSIS — J069 Acute upper respiratory infection, unspecified: Secondary | ICD-10-CM | POA: Diagnosis not present

## 2017-05-22 DIAGNOSIS — I360 Nonrheumatic tricuspid (valve) stenosis: Secondary | ICD-10-CM | POA: Diagnosis not present

## 2017-05-22 DIAGNOSIS — I498 Other specified cardiac arrhythmias: Secondary | ICD-10-CM | POA: Diagnosis not present

## 2017-05-22 DIAGNOSIS — I471 Supraventricular tachycardia: Secondary | ICD-10-CM

## 2017-05-22 DIAGNOSIS — I455 Other specified heart block: Secondary | ICD-10-CM

## 2017-05-22 LAB — BASIC METABOLIC PANEL
Anion gap: 7 (ref 5–15)
BUN: 7 mg/dL (ref 6–20)
CO2: 26 mmol/L (ref 22–32)
Calcium: 9 mg/dL (ref 8.9–10.3)
Chloride: 106 mmol/L (ref 101–111)
Creatinine, Ser: 0.56 mg/dL (ref 0.44–1.00)
GFR calc Af Amer: >60 mL/min (ref >60)
GFR calc non Af Amer: >60 mL/min (ref >60)
Glucose, Bld: 137 mg/dL — ABNORMAL HIGH (ref 65–99)
Potassium: 4.1 mmol/L (ref 3.5–5.1)
Sodium: 139 mmol/L (ref 135–145)

## 2017-05-22 LAB — CBC
HCT: 40.5 % (ref 36.0–46.0)
Hemoglobin: 12.8 g/dL (ref 12.0–15.0)
MCH: 27.6 pg (ref 26.0–34.0)
MCHC: 31.6 g/dL (ref 30.0–36.0)
MCV: 87.3 fL (ref 78.0–100.0)
Platelets: 287 10*3/uL (ref 150–400)
RBC: 4.64 MIL/uL (ref 3.87–5.11)
RDW: 14.4 % (ref 11.5–15.5)
WBC: 7.8 10*3/uL (ref 4.0–10.5)

## 2017-05-22 LAB — HIV ANTIBODY (ROUTINE TESTING W REFLEX): HIV Screen 4th Generation wRfx: NONREACTIVE

## 2017-05-22 LAB — ECHOCARDIOGRAM COMPLETE
Height: 57 in
Weight: 1984 oz

## 2017-05-22 LAB — MAGNESIUM: Magnesium: 1.9 mg/dL (ref 1.7–2.4)

## 2017-05-22 LAB — TSH: TSH: 0.713 u[IU]/mL (ref 0.350–4.500)

## 2017-05-22 MED ORDER — DILTIAZEM HCL ER COATED BEADS 120 MG PO CP24
120.0000 mg | ORAL_CAPSULE | Freq: Every day | ORAL | Status: DC
  Administered 2017-05-23: 120 mg via ORAL
  Filled 2017-05-22 (×2): qty 1

## 2017-05-22 MED ORDER — MENTHOL 3 MG MT LOZG
1.0000 | LOZENGE | OROMUCOSAL | Status: DC | PRN
  Administered 2017-05-22: 3 mg via ORAL
  Filled 2017-05-22: qty 9

## 2017-05-22 MED ORDER — IBUPROFEN 600 MG PO TABS
600.0000 mg | ORAL_TABLET | Freq: Four times a day (QID) | ORAL | Status: DC | PRN
  Administered 2017-05-22: 600 mg via ORAL
  Filled 2017-05-22: qty 1

## 2017-05-22 NOTE — Progress Notes (Signed)
   05/22/17 0118  Vitals  Temp 98.1 F (36.7 C)  Temp Source Oral  BP 123/77  BP Location Left Arm  BP Method Automatic  Patient Position (if appropriate) Lying  Pulse Rate 86  Pulse Rate Source Dinamap  Resp 20  Oxygen Therapy  SpO2 99 %  O2 Device Room Air  Pain Assessment  Pain Assessment No/denies pain  Height and Weight  Height 4\' 9"  (1.448 m)  Weight 56.2 kg (124 lb)  Type of Scale Used Standing  BSA (Calculated - sq m) 1.5 sq meters  BMI (Calculated) 26.9  Weight in (lb) to have BMI = 25 115.3   Pt. Arrived from [facility]. Txtpaged triad admission. Cardiac monitor applied and verified. Denies any pain. VSS, Pt educated regarding room, unit and call bell. Given [place] sandwhich and sprite. Husband and daughter at bedside. No further needs at this time. Will monitor.

## 2017-05-22 NOTE — Progress Notes (Signed)
PROGRESS NOTE [facility]   [name]   YQI:[id number] DOB: [birth year]  DOA: 05/21/2017 PCP: Kathryn Bears, MD   Brief Narrative:  [name] is a 56 year old female with history of NSCLC w/ ongoing chemotherapy and status post radiation who presented to the emergency department complaining of cough, shortness of breath and sore throat. Patient was found to have URI symptoms and on telemetry was found to have symptomatic sinus pauses, therefore she was referred to [facility] for [facility] evaluation.  Subjective: Patient seen and examined. Report feeling better, although sore throat still bothering. No chest pain or palpitations. Daughter at bedside, translated conversation  Assessment & Plan: Arrhythmia - SVT  Cardiology and Electrophysiology recommendations appreciated  Post termination pauses longest pause 4.5 seconds  Awaiting on ECHO for treatment - possible CCB and outpatient follow up   URI - sore throat and cough, symptoms started 4 days ago  If symptoms doesn't improve with NSAIDs and supportive treatment may need eval for possible Radiation esophagitis.   [facility]  No tumor invasion of pericardium  Follow up with Oncology as outpatient  DVT prophylaxis: Lovenox  Code Status: FULL  Family Communication: Family at bedside  Disposition Plan: Home when cleared by cardio    Consultants:   Cardiology   Electrophysiology   Procedures:   ECHO - pending   Antimicrobials: Anti-infectives    None        Objective: Vitals:   05/21/17 2254 05/22/17 0118 05/22/17 0545 05/22/17 1317  BP:  123/77 137/84 125/79  Pulse:  86 87 91  Resp: (!) 22 20 16 18   Temp:  98.1 F (36.7 C) 98.2 F (36.8 C) 98.3 F (36.8 C)  TempSrc:  Oral Oral Oral  SpO2:  99% 98% 98%  Weight:  56.2 kg (124 lb)    Height:  4\' 9"  (1.448 m)      Intake/Output Summary (Last 24 hours) at 05/22/17 1452 Last data filed at 05/22/17 0730  Gross per 24 hour  Intake             1433 ml  Output                 0 ml  Net             1433 ml   Filed Weights   05/22/17 0118  Weight: 56.2 kg (124 lb)    Examination:  General exam: Appears calm and comfortable  HEENT: AC/AT, PERRLA, OP moist and clear, TM mild erythema b/l no effusion, translucent  Respiratory system: Clear to auscultation. No wheezes,crackle or rhonchi Cardiovascular system: S1 & S2 heard, RRR. No JVD, murmurs, rubs or gallops Gastrointestinal system: Abdomen is nondistended, soft and nontender.  Central nervous system: Alert and oriented. No focal neurological deficits. Extremities: No pedal edema.  Skin: No rashes, lesions or ulcers Psychiatry: Judgement and insight appear normal. Mood & affect appropriate.    Data Reviewed: I have personally reviewed following labs and imaging studies  CBC:  Recent Labs Lab 05/21/17 1734 05/22/17 0233  WBC 8.6 7.8  HGB 13.4 12.8  HCT 40.2 40.5  MCV 86.8 87.3  PLT 283 387   Basic Metabolic Panel:  Recent Labs Lab 05/21/17 1734 05/21/17 2044 05/22/17 0233  NA 138  --  139  K 4.4  --  4.1  CL 103  --  106  CO2 27  --  26  GLUCOSE 94  --  137*  BUN 11  --  7  CREATININE 0.54  --  0.56  CALCIUM 9.0  --  9.0  MG  --  2.1 1.9    Thyroid Function Tests:  Recent Labs  05/22/17 0233  TSH 0.713   Anemia Panel: No results for input(s): VITAMINB12, FOLATE, FERRITIN, TIBC, IRON, RETICCTPCT in the last 72 hours. Sepsis Labs:  Recent Labs Lab 05/21/17 1901 05/21/17 2132  LATICACIDVEN 1.34 0.71    Recent Results (from the past 240 hour(s))  Rapid strep screen     Status: None   Collection Time: 05/21/17  6:50 PM  Result Value Ref Range Status   Streptococcus, Group A Screen (Direct) NEGATIVE NEGATIVE Final    Comment: (NOTE) A Rapid Antigen test may result negative if the antigen level in the sample is below the detection level of this test. The FDA has not cleared this test as a stand-alone test therefore the rapid antigen negative result has reflexed  to a Group A Strep culture.       Radiology Studies: Dg Chest 2 View  Result Date: 05/21/2017 CLINICAL DATA:  Pt c/o cp, burning throat pain, sob, chest and throat tightness w/productive cough x 1wk EXAM: CHEST  2 VIEW COMPARISON:  Chest x-ray dated 12/31/2016. Chest CT dated 04/03/2017. FINDINGS: Heart size is stable. Overall cardiomediastinal silhouette appears stable in size and configuration. Left chest wall Port-A-Cath is stable in position with tip at the level of the lower SVC/cavoatrial junction. Layering pleural effusion at the right lung base, moderate in size, stable amount compared to the appearance on recent chest CT. Dense opacity overlying the right heart border corresponds to the radiation change described on recent chest CT. Left lung remains clear. No pneumothorax. Osseous structures about the chest are unremarkable. IMPRESSION: 1. No acute findings. 2. Stable appearance of the pleural effusion and post radiation change within the right lung. Electronically Signed   By: Franki Cabot M.D.   On: 05/21/2017 17:56   Ct Angio Chest Pe W And/or Wo Contrast  Result Date: 05/21/2017 CLINICAL DATA:  Pt reports productive cough, and sore throat or the past 3 days. Has had non-productive cough for the past few years, but productive cough with chills is new. Also has feeling of SOB, but no obvious distress noted. No CP. EXAM: CT ANGIOGRAPHY CHEST WITH CONTRAST TECHNIQUE: Multidetector CT imaging of the chest was performed using the standard protocol during bolus administration of intravenous contrast. Multiplanar CT image reconstructions and MIPs were obtained to evaluate the vascular anatomy. CONTRAST:  60 mL of Isovue 370 intravenous contrast COMPARISON:  Current chest radiograph.  Chest CT, 04/03/2017 FINDINGS: Cardiovascular: There is satisfactory opacification of the pulmonary arteries. Respiratory motion somewhat degrades images particularly the segmental and subsegmental branches to the  lower lobes. Allowing for the respiratory motion, there is no evidence of a pulmonary embolism. The aorta is normal in caliber. No aortic dissection. No significant atherosclerosis. Heart is normal in size. No discrete coronary artery calcifications. Mediastinum/Nodes: No neck base or axillary masses or adenopathy. No discrete mediastinal or hilar masses or enlarged lymph nodes. Lungs/Pleura: There is soft tissue surrounding the right hilar structures, stable from the prior CT consistent with post surgical and treatment related changes. There is a small right pleural effusion has also stable. No left pleural effusion. Irregular scarring in the right upper lobe posteriorly is stable. The degree of volume loss on the right is unchanged. Left lung is relatively hyperexpanded. Left lung is clear. No pneumothorax. Upper Abdomen: No acute abnormality. Musculoskeletal: No fracture or acute finding. No osteoblastic  or osteolytic lesions. Review of the MIP images confirms the above findings. IMPRESSION: 1. No evidence of a pulmonary embolism. 2. Postsurgical and treatment related changes on the right, as well as a small right pleural effusion, are stable when compared to the prior CT. There is no convincing recurrent lung carcinoma or metastatic disease. 3. No evidence of pneumonia or pulmonary edema. No emphysema.  No aortic atherosclerosis. Electronically Signed   By: Lajean Manes M.D.   On: 05/21/2017 20:55     Scheduled Meds: . enoxaparin (LOVENOX) injection  40 mg Subcutaneous Q24H  . erlotinib  150 mg Oral Daily  . hydrocortisone  1 application Topical BID  . sodium chloride flush  3 mL Intravenous Q12H   Continuous Infusions:   LOS: 0 days    Time spent: Total of 25 minutes spent with pt, greater than 50% of which was spent in discussion of  treatment, counseling and coordination of care    Chipper Oman, MD Pager: Text Page via [email or web address]  [phone]  If 7PM-7AM, please contact  night-coverage [email or web address] Password [email or web address] 05/22/2017, 2:52 PM

## 2017-05-22 NOTE — Progress Notes (Signed)
  Echocardiogram 2D Echocardiogram has been performed.  [name] Sermon 05/22/2017, 2:16 PM
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
## 2017-05-23 DIAGNOSIS — I498 Other specified cardiac arrhythmias: Secondary | ICD-10-CM | POA: Diagnosis not present

## 2017-05-23 DIAGNOSIS — I471 Supraventricular tachycardia: Secondary | ICD-10-CM | POA: Diagnosis not present

## 2017-05-23 DIAGNOSIS — I10 Essential (primary) hypertension: Secondary | ICD-10-CM

## 2017-05-23 DIAGNOSIS — J069 Acute upper respiratory infection, unspecified: Secondary | ICD-10-CM | POA: Diagnosis not present

## 2017-05-23 MED ORDER — DILTIAZEM HCL ER COATED BEADS 120 MG PO CP24: 120.0000 mg | ORAL_CAPSULE | Freq: Every day | ORAL | 0 refills | Status: DC

## 2017-05-23 MED ORDER — AZITHROMYCIN 500 MG PO TABS: 500.0000 mg | ORAL_TABLET | Freq: Every day | ORAL | 0 refills | Status: DC

## 2017-05-23 MED ORDER — HEPARIN SOD (PORK) LOCK FLUSH 100 UNIT/ML IV SOLN
500.0000 [IU] | INTRAVENOUS | Status: DC | PRN
  Administered 2017-05-23: 500 [IU]

## 2017-05-23 MED ORDER — AZITHROMYCIN 500 MG PO TABS
500.0000 mg | ORAL_TABLET | Freq: Every day | ORAL | Status: DC
  Administered 2017-05-23: 500 mg via ORAL
  Filled 2017-05-23: qty 1

## 2017-05-23 MED ORDER — HEPARIN SOD (PORK) LOCK FLUSH 100 UNIT/ML IV SOLN: 500.0000 [IU] | INTRAVENOUS | Status: DC

## 2017-05-23 MED FILL — AZITHROMYCIN 500 MG TABLET: 500 | 5 days supply | Qty: 5 | Fill #0

## 2017-05-23 MED FILL — DILT XR 120 MG CAPSULE: 120 | 30 days supply | Qty: 30 | Fill #0

## 2017-05-23 NOTE — Progress Notes (Addendum)
Patient Name: [name]      SUBJECTIVE:    Echo  Normal LV function  Past Medical History:  Diagnosis Date  . Drug-induced skin rash 02/27/2017  . Encounter for antineoplastic chemotherapy 01/19/2016  . History of radiation therapy 05/02/11 to 06/08/11   right lung  . Lung cancer ([place])    right lower lobe adenocarcinoma    Scheduled Meds:  Scheduled Meds: . diltiazem  120 mg Oral Daily  . enoxaparin (LOVENOX) injection  40 mg Subcutaneous Q24H  . erlotinib  150 mg Oral Daily  . hydrocortisone  1 application Topical BID  . sodium chloride flush  3 mL Intravenous Q12H   Continuous Infusions: acetaminophen **OR** acetaminophen, chlorpheniramine-HYDROcodone, hydrALAZINE, HYDROcodone-acetaminophen, ibuprofen, menthol-cetylpyridinium, ondansetron **OR** ondansetron (ZOFRAN) IV, polyethylene glycol    PHYSICAL EXAM Vitals:   05/22/17 0545 05/22/17 1317 05/22/17 2054 05/23/17 0525  BP: 137/84 125/79 (!) 145/92 116/77  Pulse: 87 91 89 79  Resp: 16 18 18 18   Temp: 98.2 F (36.8 C) 98.3 F (36.8 C) 98 F (36.7 C) 97.7 F (36.5 C)  TempSrc: Oral Oral Oral Oral  SpO2: 98% 98% 99% 97%  Weight:      Height:       Well developed and nourished in no acute distress HENT normal Neck supple with JVP-flat Clear Regular rate and rhythm, no murmurs or gallops Abd-soft with active BS No Clubbing cyanosis edema Skin-warm and dry A & Oriented  Grossly normal sensory and motor function  TELEMETRY: Reviewed personnally pt in some ectopic atrial rhtyhms  Nothing fast and no significant pauses:     Intake/Output Summary (Last 24 hours) at 05/23/17 0914 Last data filed at 05/22/17 1630  Gross per 24 hour  Intake              240 ml  Output                0 ml  Net              240 ml    LABS: Basic Metabolic Panel:  Recent Labs Lab 05/21/17 1734 05/21/17 2044 05/22/17 0233  NA 138  --  139  K 4.4  --  4.1  CL 103  --  106  CO2 27  --  26  GLUCOSE 94  --  137*   BUN 11  --  7  CREATININE 0.54  --  0.56  CALCIUM 9.0  --  9.0  MG  --  2.1 1.9   Cardiac Enzymes: No results for input(s): CKTOTAL, CKMB, CKMBINDEX, TROPONINI in the last 72 hours. CBC:  Recent Labs Lab 05/21/17 1734 05/22/17 0233  WBC 8.6 7.8  HGB 13.4 12.8  HCT 40.2 40.5  MCV 86.8 87.3  PLT 283 287   PROTIME: No results for input(s): LABPROT, INR in the last 72 hours. Liver Function Tests: No results for input(s): AST, ALT, ALKPHOS, BILITOT, PROT, ALBUMIN in the last 72 hours. No results for input(s): LIPASE, AMYLASE in the last 72 hours. BNP: BNP (last 3 results)  Recent Labs  12/31/16 1647  BNP 30.2    ProBNP (last 3 results) No results for input(s): PROBNP in the last 8760 hours.  D-Dimer: No results for input(s): DDIMER in the last 72 hours. Hemoglobin A1C: No results for input(s): HGBA1C in the last 72 hours. Fasting Lipid Panel: No results for input(s): CHOL, HDL, LDLCALC, TRIG, CHOLHDL, LDLDIRECT in the last 72 hours. Thyroid Function Tests:  Recent Labs  05/22/17 0233  TSH 0.713     ASSESSMENT AND PLAN:  Principal Problem:   Atrial arrhythmia Active Problems:   Primary cancer of right lower lobe of lung (HCC)   Dyspnea   Acute upper respiratory infection   Essential hypertension  Reviewed things with pt using daughter as translator Normal LV function Would discharge to home on Dilt and will arrange office f/u 3-4 weeks with [facility] Side effects reviewed. This should also help with BP management   Thanks for consult   Signed, Virl Axe MD  05/23/2017

## 2017-05-23 NOTE — Discharge Summary (Signed)
Physician Discharge Summary  Sallyann Kinnaird ZJI:[id number] DOB: 07/04/1961 DOA: 05/21/2017  PCP: Curt Bears, MD  Admit date: 05/21/2017 Discharge date: 05/23/2017  Admitted From: Home Disposition:  Home  Recommendations for Outpatient Follow-up:  1. Follow up with PCP in 1-2 weeks 2. Please obtain BMP/CBC in one week 3. Please follow up with Dr Caryl Comes in 3 to 4 weeks.    Discharge Condition:stable.  CODE STATUS:full code.  Diet recommendation: Heart Healthy   Brief/Interim Summary: [name] is a 56 year old female with history of NSCLC w/ ongoing chemotherapy and status post radiation who presented to the emergency department complaining of cough, shortness of breath and sore throat. Patient was found to have URI symptoms and on telemetry was found to have symptomatic sinus pauses, therefore she was referred to [facility] for [facility] evaluation. Dr Caryl Comes saw the patient, recommended echocardiogram, which showed normal LV function, suggested to be discharged on diltiazem and outpatient follow up with with EP in 3 to 4 weeks.   Discharge Diagnoses:  Principal Problem:   Atrial arrhythmia Active Problems:   Primary cancer of right lower lobe of lung (HCC)   Dyspnea   Acute upper respiratory infection   Essential hypertension  SVT:  arrythmia with pauses.  Echo showed normal LVEF, and on diltiazem.  outpatinet EP follow up.    Bronchitis vs radiation esophagitis:  Started her zithromax .  Outpatient follow up with PCP.    NSCLC:  Follow up with oncology as outpatient.   Discharge Instructions  Discharge Instructions    Diet - low sodium heart healthy    Complete by:  As directed    Discharge instructions    Complete by:  As directed    Please follow up with oncology Dr Julien Nordmann as recommended.     Allergies as of 05/23/2017      Reactions   Codeine Nausea Only   Doxycycline Nausea And Vomiting   vomiting and headache      Medication List    STOP taking these medications    lidocaine-prilocaine cream Commonly known as:  EMLA     TAKE these medications   albuterol 108 (90 Base) MCG/ACT inhaler Commonly known as:  PROVENTIL HFA;VENTOLIN HFA Inhale 2 puffs into the lungs every 6 (six) hours as needed for wheezing.   azithromycin 500 MG tablet Commonly known as:  ZITHROMAX Take 1 tablet (500 mg total) by mouth daily.   chlorpheniramine-HYDROcodone 10-8 MG/5ML Suer Commonly known as:  TUSSIONEX TAKE 5ML EVERY 12 HOURS AS NEEDED FOR COUGH   diltiazem 120 MG 24 hr capsule Commonly known as:  CARDIZEM CD Take 1 capsule (120 mg total) by mouth daily. Start taking on:  05/24/2017   hydrocortisone 1 % ointment Apply 1 application topically 2 (two) times daily.   TARCEVA 150 MG tablet Generic drug:  erlotinib Take 1 tablet ( 150 mg total )  by mouth daily      Follow-up Information    Baldwin Jamaica, PA-C Follow up on 06/19/2017.   Specialty:  Cardiology Why:  2:30PM Contact information: [address] [place] [address] [phone]        Curt Bears, MD. Schedule an appointment as soon as possible for a visit in 1 week(s).   Specialty:  Oncology Contact information: [address] [place]  [address] [phone]          Allergies  Allergen Reactions  . Codeine Nausea Only  . Doxycycline Nausea And Vomiting  vomiting and headache    Consultations:  Cardiology  EP   Procedures/Studies: Dg Chest 2 View  Result Date: 05/21/2017 CLINICAL DATA:  Pt c/o cp, burning throat pain, sob, chest and throat tightness w/productive cough x 1wk EXAM: CHEST  2 VIEW COMPARISON:  Chest x-ray dated 12/31/2016. Chest CT dated 04/03/2017. FINDINGS: Heart size is stable. Overall cardiomediastinal silhouette appears stable in size and configuration. Left chest wall Port-A-Cath is stable in position with tip at the level of the lower SVC/cavoatrial junction. Layering pleural effusion at the right lung base, moderate  in size, stable amount compared to the appearance on recent chest CT. Dense opacity overlying the right heart border corresponds to the radiation change described on recent chest CT. Left lung remains clear. No pneumothorax. Osseous structures about the chest are unremarkable. IMPRESSION: 1. No acute findings. 2. Stable appearance of the pleural effusion and post radiation change within the right lung. Electronically Signed   By: Franki Cabot M.D.   On: 05/21/2017 17:56   Ct Angio Chest Pe W And/or Wo Contrast  Result Date: 05/21/2017 CLINICAL DATA:  Pt reports productive cough, and sore throat or the past 3 days. Has had non-productive cough for the past few years, but productive cough with chills is new. Also has feeling of SOB, but no obvious distress noted. No CP. EXAM: CT ANGIOGRAPHY CHEST WITH CONTRAST TECHNIQUE: Multidetector CT imaging of the chest was performed using the standard protocol during bolus administration of intravenous contrast. Multiplanar CT image reconstructions and MIPs were obtained to evaluate the vascular anatomy. CONTRAST:  60 mL of Isovue 370 intravenous contrast COMPARISON:  Current chest radiograph.  Chest CT, 04/03/2017 FINDINGS: Cardiovascular: There is satisfactory opacification of the pulmonary arteries. Respiratory motion somewhat degrades images particularly the segmental and subsegmental branches to the lower lobes. Allowing for the respiratory motion, there is no evidence of a pulmonary embolism. The aorta is normal in caliber. No aortic dissection. No significant atherosclerosis. Heart is normal in size. No discrete coronary artery calcifications. Mediastinum/Nodes: No neck base or axillary masses or adenopathy. No discrete mediastinal or hilar masses or enlarged lymph nodes. Lungs/Pleura: There is soft tissue surrounding the right hilar structures, stable from the prior CT consistent with post surgical and treatment related changes. There is a small right pleural  effusion has also stable. No left pleural effusion. Irregular scarring in the right upper lobe posteriorly is stable. The degree of volume loss on the right is unchanged. Left lung is relatively hyperexpanded. Left lung is clear. No pneumothorax. Upper Abdomen: No acute abnormality. Musculoskeletal: No fracture or acute finding. No osteoblastic or osteolytic lesions. Review of the MIP images confirms the above findings. IMPRESSION: 1. No evidence of a pulmonary embolism. 2. Postsurgical and treatment related changes on the right, as well as a small right pleural effusion, are stable when compared to the prior CT. There is no convincing recurrent lung carcinoma or metastatic disease. 3. No evidence of pneumonia or pulmonary edema. No emphysema.  No aortic atherosclerosis. Electronically Signed   By: Lajean Manes M.D.   On: 05/21/2017 20:55    (Echo, Carotid, EGD, Colonoscopy, ERCP)    Subjective:   Discharge Exam: Vitals:   05/23/17 0525 05/23/17 1004  BP: 116/77 120/78  Pulse: 79   Resp: 18   Temp: 97.7 F (36.5 C)    Vitals:   05/22/17 1317 05/22/17 2054 05/23/17 0525 05/23/17 1004  BP: 125/79 (!) 145/92 116/77 120/78  Pulse: 91 89 79   Resp:  18 18 18    Temp: 98.3 F (36.8 C) 98 F (36.7 C) 97.7 F (36.5 C)   TempSrc: Oral Oral Oral   SpO2: 98% 99% 97%   Weight:      Height:        General: Pt is alert, awake, not in acute distress Cardiovascular: RRR, S1/S2 +, no rubs, no gallops Respiratory: CTA bilaterally, no wheezing, no rhonchi Abdominal: Soft, NT, ND, bowel sounds + Extremities: no edema, no cyanosis    The results of significant diagnostics from this hospitalization (including imaging, microbiology, ancillary and laboratory) are listed below for reference.     Microbiology: Recent Results (from the past 240 hour(s))  Rapid strep screen     Status: None   Collection Time: 05/21/17  6:50 PM  Result Value Ref Range Status   Streptococcus, Group A Screen  (Direct) NEGATIVE NEGATIVE Final    Comment: (NOTE) A Rapid Antigen test may result negative if the antigen level in the sample is below the detection level of this test. The FDA has not cleared this test as a stand-alone test therefore the rapid antigen negative result has reflexed to a Group A Strep culture.      Labs: BNP (last 3 results)  Recent Labs  12/31/16 1647  BNP 93.8   Basic Metabolic Panel:  Recent Labs Lab 05/21/17 1734 05/21/17 2044 05/22/17 0233  NA 138  --  139  K 4.4  --  4.1  CL 103  --  106  CO2 27  --  26  GLUCOSE 94  --  137*  BUN 11  --  7  CREATININE 0.54  --  0.56  CALCIUM 9.0  --  9.0  MG  --  2.1 1.9   Liver Function Tests: No results for input(s): AST, ALT, ALKPHOS, BILITOT, PROT, ALBUMIN in the last 168 hours. No results for input(s): LIPASE, AMYLASE in the last 168 hours. No results for input(s): AMMONIA in the last 168 hours. CBC:  Recent Labs Lab 05/21/17 1734 05/22/17 0233  WBC 8.6 7.8  HGB 13.4 12.8  HCT 40.2 40.5  MCV 86.8 87.3  PLT 283 287   Cardiac Enzymes: No results for input(s): CKTOTAL, CKMB, CKMBINDEX, TROPONINI in the last 168 hours. BNP: Invalid input(s): POCBNP CBG: No results for input(s): GLUCAP in the last 168 hours. D-Dimer No results for input(s): DDIMER in the last 72 hours. Hgb A1c No results for input(s): HGBA1C in the last 72 hours. Lipid Profile No results for input(s): CHOL, HDL, LDLCALC, TRIG, CHOLHDL, LDLDIRECT in the last 72 hours. Thyroid function studies  Recent Labs  05/22/17 0233  TSH 0.713   Anemia work up No results for input(s): VITAMINB12, FOLATE, FERRITIN, TIBC, IRON, RETICCTPCT in the last 72 hours. Urinalysis    Component Value Date/Time   COLORURINE STRAW (A) 12/31/2016 1648   APPEARANCEUR CLEAR 12/31/2016 1648   LABSPEC 1.005 12/31/2016 1648   LABSPEC 1.010 04/21/2015 1047   PHURINE 8.0 12/31/2016 1648   GLUCOSEU NEGATIVE 12/31/2016 1648   GLUCOSEU Negative  04/21/2015 1047   HGBUR NEGATIVE 12/31/2016 1648   BILIRUBINUR NEGATIVE 12/31/2016 1648   BILIRUBINUR Negative 04/21/2015 1047   KETONESUR NEGATIVE 12/31/2016 1648   PROTEINUR NEGATIVE 12/31/2016 1648   UROBILINOGEN 0.2 04/21/2015 1047   NITRITE NEGATIVE 12/31/2016 1648   LEUKOCYTESUR NEGATIVE 12/31/2016 1648   LEUKOCYTESUR Large 04/21/2015 1047   Sepsis Labs Invalid input(s): PROCALCITONIN,  WBC,  LACTICIDVEN Microbiology Recent Results (from the past 240 hour(s))  Rapid  strep screen     Status: None   Collection Time: 05/21/17  6:50 PM  Result Value Ref Range Status   Streptococcus, Group A Screen (Direct) NEGATIVE NEGATIVE Final    Comment: (NOTE) A Rapid Antigen test may result negative if the antigen level in the sample is below the detection level of this test. The FDA has not cleared this test as a stand-alone test therefore the rapid antigen negative result has reflexed to a Group A Strep culture.      Time coordinating discharge: Over 30 minutes  SIGNED:   Hosie Poisson, MD  [facility] 05/23/2017, 10:12 AM Pager   If 7PM-7AM, please contact night-coverage [email or web address] Password [email or web address]

## 2017-05-23 NOTE — Telephone Encounter (Signed)
Oral Chemotherapy Pharmacist Encounter  Received notification from [employer] that patient has been successfully enrolled into their program to receive Tarceva at $0 out of pocket cost from the manufacturer until medication discontinuation.  Patient has been notified and 1st shipment of Tarceva is on the way. Due to patient's recent admission, 1st Tarceva shipment is at a [employer] holding center and I have advised [employer] to reach out to patient's son to coordinate picking up this shipment.  Oral Oncology Clinic will continue to follow  Johny Drilling, PharmD, BCPS, BCOP 05/23/2017  3:14 PM Oral Oncology Clinic [phone]

## 2017-05-24 LAB — CULTURE, GROUP A STREP (THRC)

## 2017-06-14 ENCOUNTER — Ambulatory Visit (HOSPITAL_BASED_OUTPATIENT_CLINIC_OR_DEPARTMENT_OTHER): Payer: Medicare Other | Admitting: Internal Medicine

## 2017-06-14 ENCOUNTER — Other Ambulatory Visit: Payer: Self-pay | Admitting: Medical Oncology

## 2017-06-14 ENCOUNTER — Other Ambulatory Visit (HOSPITAL_BASED_OUTPATIENT_CLINIC_OR_DEPARTMENT_OTHER): Payer: Medicare Other

## 2017-06-14 ENCOUNTER — Encounter: Payer: Self-pay | Admitting: Internal Medicine

## 2017-06-14 ENCOUNTER — Telehealth: Payer: Self-pay | Admitting: Internal Medicine

## 2017-06-14 DIAGNOSIS — C3490 Malignant neoplasm of unspecified part of unspecified bronchus or lung: Secondary | ICD-10-CM

## 2017-06-14 DIAGNOSIS — I498 Other specified cardiac arrhythmias: Secondary | ICD-10-CM | POA: Diagnosis not present

## 2017-06-14 DIAGNOSIS — R059 Cough, unspecified: Secondary | ICD-10-CM

## 2017-06-14 DIAGNOSIS — R05 Cough: Secondary | ICD-10-CM

## 2017-06-14 DIAGNOSIS — C3431 Malignant neoplasm of lower lobe, right bronchus or lung: Secondary | ICD-10-CM

## 2017-06-14 DIAGNOSIS — C3491 Malignant neoplasm of unspecified part of right bronchus or lung: Secondary | ICD-10-CM

## 2017-06-14 DIAGNOSIS — Z95828 Presence of other vascular implants and grafts: Secondary | ICD-10-CM

## 2017-06-14 DIAGNOSIS — Z5111 Encounter for antineoplastic chemotherapy: Secondary | ICD-10-CM

## 2017-06-14 DIAGNOSIS — L600 Ingrowing nail: Secondary | ICD-10-CM

## 2017-06-14 LAB — CBC WITH DIFFERENTIAL/PLATELET
BASO%: 0.4 % (ref 0.0–2.0)
Basophils Absolute: 0 10*3/uL (ref 0.0–0.1)
EOS%: 1.1 % (ref 0.0–7.0)
Eosinophils Absolute: 0.1 10*3/uL (ref 0.0–0.5)
HCT: 40.1 % (ref 34.8–46.6)
HGB: 13.4 g/dL (ref 11.6–15.9)
LYMPH%: 13.6 % — ABNORMAL LOW (ref 14.0–49.7)
MCH: 28.4 pg (ref 25.1–34.0)
MCHC: 33.5 g/dL (ref 31.5–36.0)
MCV: 84.7 fL (ref 79.5–101.0)
MONO#: 0.5 10*3/uL (ref 0.1–0.9)
MONO%: 5 % (ref 0.0–14.0)
NEUT#: 8.8 10*3/uL — ABNORMAL HIGH (ref 1.5–6.5)
NEUT%: 79.9 % — ABNORMAL HIGH (ref 38.4–76.8)
Platelets: 306 10*3/uL (ref 145–400)
RBC: 4.73 10*6/uL (ref 3.70–5.45)
RDW: 14.2 % (ref 11.2–14.5)
WBC: 11 10*3/uL — ABNORMAL HIGH (ref 3.9–10.3)
lymph#: 1.5 10*3/uL (ref 0.9–3.3)

## 2017-06-14 LAB — COMPREHENSIVE METABOLIC PANEL
ALT: 28 U/L (ref 0–55)
AST: 30 U/L (ref 5–34)
Albumin: 3.8 g/dL (ref 3.5–5.0)
Alkaline Phosphatase: 93 U/L (ref 40–150)
Anion Gap: 10 mEq/L (ref 3–11)
BUN: 12.3 mg/dL (ref 7.0–26.0)
CO2: 31 mEq/L — ABNORMAL HIGH (ref 22–29)
Calcium: 9.6 mg/dL (ref 8.4–10.4)
Chloride: 101 mEq/L (ref 98–109)
Creatinine: 0.7 mg/dL (ref 0.6–1.1)
EGFR: >90 mL/min/{1.73_m2} (ref >90)
Glucose: 85 mg/dl (ref 70–140)
Potassium: 4.3 mEq/L (ref 3.5–5.1)
Sodium: 141 mEq/L (ref 136–145)
Total Bilirubin: 0.98 mg/dL (ref 0.20–1.20)
Total Protein: 7.8 g/dL (ref 6.4–8.3)

## 2017-06-14 MED ORDER — HYDROCOD POLST-CPM POLST ER 10-8 MG/5ML PO SUER: ORAL | 0 refills | Status: DC

## 2017-06-14 MED ORDER — ALBUTEROL SULFATE HFA 108 (90 BASE) MCG/ACT IN AERS: 2.0000 | INHALATION_SPRAY | Freq: Four times a day (QID) | RESPIRATORY_TRACT | 1 refills | Status: DC | PRN

## 2017-06-14 MED FILL — VENTOLIN HFA 90 MCG INHALER: 108 (90 BAS | 25 days supply | Qty: 18 | Fill #0

## 2017-06-14 MED FILL — HYDROCODONE-CHLORPHENIRAM S: 10-8 | 14 days supply | Qty: 140 | Fill #0

## 2017-06-14 NOTE — Progress Notes (Signed)
[place] [phone]   Fax:([phone]) [place], [place] [address]  PRINCIPAL DIAGNOSIS: Local recurrence of non-small cell lung cancer, adenocarcinoma, initially diagnosed as stage IB (T2a N0 M0) in March of 2010.   PRIOR THERAPY:  1. Status post right lower lobectomy under the care of Dr. Cyndia Bent on 04/08/2009. 2. Status post palliative radiotherapy to the right lower lobe recurrent lung mass under the care of Dr. Lisbeth Renshaw, completed on June 08, 2011. 3. Systemic chemotherapy with carboplatin for AUC of 5 and Alimta 500 mg/M2 every 3 weeks. She is status post 3 cycles.  CURRENT THERAPY: Tarceva 150 mg by mouth daily, therapy beginning 07/24/2012. Status post approximately 51 months of therapy.   INTERVAL HISTORY: [name] 56 y.o. female returns to the clinic today for follow-up visit accompanied by her interpreter. The patient is feeling fine today with no specific complaints except for intermittent cough. She was seen recently at [facility] with shortness breath and she was found to have atrial arrhythmia. She was started on diltiazem. She is followed by cardiology. She had CT angiogram of the chest at that time that showed no evidence of pulmonary embolism or disease progression. The patient is feeling fine today with no specific complaints. She continues to tolerate her treatment with Tarceva fairly well. She is here today for evaluation and repeat blood work.   MEDICAL HISTORY: Past Medical History:  Diagnosis Date  . Drug-induced skin rash 02/27/2017  . Encounter for antineoplastic chemotherapy 01/19/2016  . History of radiation therapy 05/02/11 to 06/08/11   right lung  . Lung cancer ([place])    right lower lobe adenocarcinoma    ALLERGIES:  is allergic to codeine and doxycycline.  MEDICATIONS:  Current Outpatient Prescriptions  Medication Sig Dispense Refill  . albuterol  (PROVENTIL HFA;VENTOLIN HFA) 108 (90 Base) MCG/ACT inhaler Inhale 2 puffs into the lungs every 6 (six) hours as needed for wheezing. 1 Inhaler 1  . chlorpheniramine-HYDROcodone (TUSSIONEX) 10-8 MG/5ML SUER TAKE 5ML EVERY 12 HOURS AS NEEDED FOR COUGH 140 mL 0  . diltiazem (CARDIZEM CD) 120 MG 24 hr capsule Take 1 capsule (120 mg total) by mouth daily. 30 capsule 0  . hydrocortisone 1 % ointment Apply 1 application topically 2 (two) times daily. 30 g 0  . TARCEVA 150 MG tablet Take 1 tablet ( 150 mg total )  by mouth daily 30 tablet 10   No current facility-administered medications for this visit.     SURGICAL HISTORY:  Past Surgical History:  Procedure Laterality Date  . LUNG LOBECTOMY  04/18/2009   RLL  . PORTACATH PLACEMENT  02/29/2012   Procedure: INSERTION PORT-A-CATH;  Surgeon: Nicanor Alcon, MD;  Location: [facility];  Service: Thoracic;  Laterality: Left;  PowerPort 8 F attachable in left internal jugular.    REVIEW OF SYSTEMS:  A comprehensive review of systems was negative except for: Respiratory: positive for cough   PHYSICAL EXAMINATION: General appearance: alert, cooperative, fatigued and no distress Head: Normocephalic, without obvious abnormality, atraumatic Neck: no adenopathy, no JVD, supple, symmetrical, trachea midline and thyroid not enlarged, symmetric, no tenderness/mass/nodules Lymph nodes: Cervical, supraclavicular, and axillary nodes normal. Resp: clear to auscultation bilaterally Back: symmetric, no curvature. ROM normal. No CVA tenderness. Cardio: regular rate and rhythm, S1, S2 normal, no murmur, click, rub or gallop GI: soft, non-tender; bowel sounds normal; no masses,  no organomegaly Extremities: extremities  normal, atraumatic, no cyanosis or edema  ECOG PERFORMANCE STATUS: 1 - Symptomatic but completely ambulatory  Blood pressure 132/85, pulse 100, temperature 98.4 F (36.9 C), temperature source Oral, resp. rate 18, height 4\' 9"  (1.448 m), weight 124 lb 6.4  oz (56.4 kg), last menstrual period 02/10/2012, SpO2 99 %.  LABORATORY DATA: Lab Results  Component Value Date   WBC 11.0 (H) 06/14/2017   HGB 13.4 06/14/2017   HCT 40.1 06/14/2017   MCV 84.7 06/14/2017   PLT 306 06/14/2017      Chemistry      Component Value Date/Time   NA 141 06/14/2017 1025   K 4.3 06/14/2017 1025   CL 106 05/22/2017 0233   CL 102 04/30/2013 1002   CO2 31 (H) 06/14/2017 1025   BUN 12.3 06/14/2017 1025   CREATININE 0.7 06/14/2017 1025      Component Value Date/Time   CALCIUM 9.6 06/14/2017 1025   ALKPHOS 93 06/14/2017 1025   AST 30 06/14/2017 1025   ALT 28 06/14/2017 1025   BILITOT 0.98 06/14/2017 1025       RADIOGRAPHIC STUDIES: Dg Chest 2 View  Result Date: 05/21/2017 CLINICAL DATA:  Pt c/o cp, burning throat pain, sob, chest and throat tightness w/productive cough x 1wk EXAM: CHEST  2 VIEW COMPARISON:  Chest x-ray dated 12/31/2016. Chest CT dated 04/03/2017. FINDINGS: Heart size is stable. Overall cardiomediastinal silhouette appears stable in size and configuration. Left chest wall Port-A-Cath is stable in position with tip at the level of the lower SVC/cavoatrial junction. Layering pleural effusion at the right lung base, moderate in size, stable amount compared to the appearance on recent chest CT. Dense opacity overlying the right heart border corresponds to the radiation change described on recent chest CT. Left lung remains clear. No pneumothorax. Osseous structures about the chest are unremarkable. IMPRESSION: 1. No acute findings. 2. Stable appearance of the pleural effusion and post radiation change within the right lung. Electronically Signed   By: Franki Cabot M.D.   On: 05/21/2017 17:56   Ct Angio Chest Pe W And/or Wo Contrast  Result Date: 05/21/2017 CLINICAL DATA:  Pt reports productive cough, and sore throat or the past 3 days. Has had non-productive cough for the past few years, but productive cough with chills is new. Also has feeling of  SOB, but no obvious distress noted. No CP. EXAM: CT ANGIOGRAPHY CHEST WITH CONTRAST TECHNIQUE: Multidetector CT imaging of the chest was performed using the standard protocol during bolus administration of intravenous contrast. Multiplanar CT image reconstructions and MIPs were obtained to evaluate the vascular anatomy. CONTRAST:  60 mL of Isovue 370 intravenous contrast COMPARISON:  Current chest radiograph.  Chest CT, 04/03/2017 FINDINGS: Cardiovascular: There is satisfactory opacification of the pulmonary arteries. Respiratory motion somewhat degrades images particularly the segmental and subsegmental branches to the lower lobes. Allowing for the respiratory motion, there is no evidence of a pulmonary embolism. The aorta is normal in caliber. No aortic dissection. No significant atherosclerosis. Heart is normal in size. No discrete coronary artery calcifications. Mediastinum/Nodes: No neck base or axillary masses or adenopathy. No discrete mediastinal or hilar masses or enlarged lymph nodes. Lungs/Pleura: There is soft tissue surrounding the right hilar structures, stable from the prior CT consistent with post surgical and treatment related changes. There is a small right pleural effusion has also stable. No left pleural effusion. Irregular scarring in the right upper lobe posteriorly is stable. The degree of volume loss on the right is unchanged. Left  lung is relatively hyperexpanded. Left lung is clear. No pneumothorax. Upper Abdomen: No acute abnormality. Musculoskeletal: No fracture or acute finding. No osteoblastic or osteolytic lesions. Review of the MIP images confirms the above findings. IMPRESSION: 1. No evidence of a pulmonary embolism. 2. Postsurgical and treatment related changes on the right, as well as a small right pleural effusion, are stable when compared to the prior CT. There is no convincing recurrent lung carcinoma or metastatic disease. 3. No evidence of pneumonia or pulmonary edema. No  emphysema.  No aortic atherosclerosis. Electronically Signed   By: Lajean Manes M.D.   On: 05/21/2017 20:55    ASSESSMENT AND PLAN:  This is a very pleasant 56 years old Asian female with recurrent non-small cell lung cancer, adenocarcinoma status post surgical resection followed by systemic chemotherapy with carboplatin and Alimta followed by disease progression and the patient was started on treatment with Tarceva 150 mg by mouth daily status post 51 months of treatment. She has been tolerating the treatment well with no significant adverse effects. Her recent CT angiogram of the chest showed no clear evidence for disease progression. I discussed the scan results with the patient today. I recommended for her to continue on treatment with Tarceva with the same dose. I will see her back for follow-up visit in one month for evaluation and repeat blood work. I reviewed with the patient referral for Tussionex and albuterol. For the cardiac arrest not, she will continue her treatment with diltiazem and follow-up with cardiology. The patient voices understanding of current disease status and treatment options and is in agreement with the current care plan. All questions were answered. The patient knows to call the clinic with any problems, questions or concerns. We can certainly see the patient much sooner if necessary. I spent 10 minutes counseling the patient face to face. The total time spent in the appointment was 15 minutes.  Disclaimer: This note was dictated with voice recognition software. Similar sounding words can inadvertently be transcribed and may not be corrected upon review.

## 2017-06-14 NOTE — Telephone Encounter (Signed)
Scheduled appt per 7/18 los - Gave patient AVS and calender per los. Lab and f/u in one month.

## 2017-06-15 ENCOUNTER — Encounter: Payer: Self-pay | Admitting: Family

## 2017-06-15 ENCOUNTER — Ambulatory Visit (INDEPENDENT_AMBULATORY_CARE_PROVIDER_SITE_OTHER): Payer: PRIVATE HEALTH INSURANCE | Admitting: Family

## 2017-06-15 VITALS — BP 122/74 | HR 93 | Temp 98.6°F | Resp 16 | Ht <= 58 in | Wt 125.4 lb

## 2017-06-15 DIAGNOSIS — J301 Allergic rhinitis due to pollen: Secondary | ICD-10-CM | POA: Diagnosis not present

## 2017-06-15 DIAGNOSIS — I498 Other specified cardiac arrhythmias: Secondary | ICD-10-CM | POA: Diagnosis not present

## 2017-06-15 NOTE — Assessment & Plan Note (Signed)
Symptoms and exam consistent with seasonal allergic rhinitis. Start second-generation antihistamine and nasal corticosteroid. Follow-up if symptoms worsen or do not improve.

## 2017-06-15 NOTE — Patient Instructions (Addendum)
Thank you for choosing [facility].  SUMMARY AND INSTRUCTIONS:  Please continue to take your diltiazem as prescribed for your heart.   For your cough and sneezing try Allegra, Claratin, Zyrtec or Xyzal.  Also add Flonase, Rhinocort or Nasacort as needed.   Follow up with cardiology as scheduled.   Schedule at time for your wellness exam at your convenience.  Follow up if symptoms worsen or do not improve.    Follow up:  If your symptoms worsen or fail to improve, please contact our office for further instruction, or in case of emergency go directly to the emergency room at the closest medical facility.

## 2017-06-15 NOTE — Progress Notes (Signed)
Subjective:    Patient ID: [name], female    DOB: 09-14-1961, 56 y.o.   MRN: [id number]  Chief Complaint  Patient presents with  . Establish Care    cough and sneezing since this morning    HPI:  [name] is a 56 y.o. female who  has a past medical history of Drug-induced skin rash (02/27/2017); Encounter for antineoplastic chemotherapy (01/19/2016); History of radiation therapy (05/02/11 to 06/08/11); and Lung cancer ([place]). and presents today for an office visit to establish care. A medical translator is present as the patient speaks primarily [place].   1.) Cough/Sneezing - This is a new problem. Associated symptoms of cough and sneezing has been going on since earlier this morning. No fevers. Does have itchy/watery eyes. No modifying factors or attempted treatments. Severity of the symptoms is mild.   2.) Atrial arrythemia - Previously diagnosed with atrial arrythmia and maintained on diltiazem. Reports taking the medication as prescribed and denies adverse side effects. No chest pain, shortness of breath or paroxysmal nocturnal dyspnea.    Allergies  Allergen Reactions  . Codeine Nausea Only  . Doxycycline Nausea And Vomiting    vomiting and headache      Outpatient Medications Prior to Visit  Medication Sig Dispense Refill  . albuterol (PROVENTIL HFA;VENTOLIN HFA) 108 (90 Base) MCG/ACT inhaler Inhale 2 puffs into the lungs every 6 (six) hours as needed for wheezing. 1 Inhaler 1  . diltiazem (CARDIZEM CD) 120 MG 24 hr capsule Take 1 capsule (120 mg total) by mouth daily. 30 capsule 0  . hydrocortisone 1 % ointment Apply 1 application topically 2 (two) times daily. 30 g 0  . TARCEVA 150 MG tablet Take 1 tablet ( 150 mg total )  by mouth daily 30 tablet 10  . chlorpheniramine-HYDROcodone (TUSSIONEX) 10-8 MG/5ML SUER TAKE 5ML EVERY 12 HOURS AS NEEDED FOR COUGH 140 mL 0   No facility-administered medications prior to visit.      Past Medical History:  Diagnosis Date  .  Drug-induced skin rash 02/27/2017  . Encounter for antineoplastic chemotherapy 01/19/2016  . History of radiation therapy 05/02/11 to 06/08/11   right lung  . Lung cancer ([place])    right lower lobe adenocarcinoma      Past Surgical History:  Procedure Laterality Date  . LUNG LOBECTOMY  04/18/2009   RLL  . PORTACATH PLACEMENT  02/29/2012   Procedure: INSERTION PORT-A-CATH;  Surgeon: Nicanor Alcon, MD;  Location: [facility];  Service: Thoracic;  Laterality: Left;  PowerPort 8 F attachable in left internal jugular.      Family History  Problem Relation Age of Onset  . Heart Problems Mother   . Cancer Neg Hx       Social History   Social History  . Marital status: Married    Spouse name: N/A  . Number of children: 7  . Years of education: N/A   Occupational History  . Not on file.   Social History Main Topics  . Smoking status: Never Smoker  . Smokeless tobacco: Never Used  . Alcohol use No  . Drug use: No  . Sexual activity: Yes   Other Topics Concern  . Not on file   Social History Narrative   09/01/2014   The patient is a 56 year old [place] woman.   The patient is married and has 7 children. One child passed away.   The patient came to the [place] in approximately 1990. They were in [place]  for 10-11 years and then moved to [place], [place] since then.   Patient does not smoke, drink, or use illicit drugs.   Patient has not used smokeless tobacco products.   Fun/Hobby: Go shopping       Review of Systems  Constitutional: Negative for chills and fever.  HENT: Positive for congestion, rhinorrhea and sneezing.   Eyes: Positive for itching.  Respiratory: Positive for cough. Negative for chest tightness and shortness of breath.   Cardiovascular: Negative for chest pain, palpitations and leg swelling.       Objective:    BP 122/74 (BP Location: Left Arm, Patient Position: Sitting, Cuff Size: Normal)   Pulse 93   Temp 98.6 F (37 C) (Oral)    Resp 16   Ht 4\' 9"  (1.448 m)   Wt 125 lb 6.4 oz (56.9 kg)   LMP 02/10/2012   SpO2 96%   BMI 27.14 kg/m  Nursing note and vital signs reviewed.  Physical Exam  Constitutional: She is oriented to person, place, and time. She appears well-developed and well-nourished. No distress.  HENT:  Right Ear: Hearing, tympanic membrane, external ear and ear canal normal.  Left Ear: Hearing, tympanic membrane, external ear and ear canal normal.  Nose: Nose normal.  Mouth/Throat: Uvula is midline, oropharynx is clear and moist and mucous membranes are normal.  Cardiovascular: Normal rate, regular rhythm, normal heart sounds and intact distal pulses.   Pulmonary/Chest: Effort normal and breath sounds normal.  Neurological: She is alert and oriented to person, place, and time.  Skin: Skin is warm and dry.  Psychiatric: She has a normal mood and affect. Her behavior is normal. Judgment and thought content normal.        Assessment & Plan:   Problem List Items Addressed This Visit      Cardiovascular and Mediastinum   Atrial arrhythmia    Cardiac exam with tachycardia noted on auscultation. Appears stable with current dosage of diltiazem with no adverse side effects. Continue to follow-up with cardiology as scheduled.        Respiratory   Seasonal allergic rhinitis due to pollen - Primary    Symptoms and exam consistent with seasonal allergic rhinitis. Start second-generation antihistamine and nasal corticosteroid. Follow-up if symptoms worsen or do not improve.          I have discontinued Ms. Neuzil chlorpheniramine-HYDROcodone. I am also having her maintain her hydrocortisone, TARCEVA, diltiazem, and albuterol.   Follow-up: Return in about 3 months (around 09/15/2017), or if symptoms worsen or fail to improve.  Mauricio Po, FNP

## 2017-06-15 NOTE — Assessment & Plan Note (Signed)
Cardiac exam with tachycardia noted on auscultation. Appears stable with current dosage of diltiazem with no adverse side effects. Continue to follow-up with cardiology as scheduled.

## 2017-06-16 ENCOUNTER — Telehealth: Payer: Self-pay | Admitting: Family

## 2017-06-16 NOTE — Telephone Encounter (Signed)
Needing updated Medicare card. Called and spoke with the pts son. He said that he would get in touch with her to see if they could fax, email or bring the card by to update her chart.
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
## 2017-06-19 ENCOUNTER — Encounter (INDEPENDENT_AMBULATORY_CARE_PROVIDER_SITE_OTHER): Payer: Self-pay

## 2017-06-19 ENCOUNTER — Ambulatory Visit (INDEPENDENT_AMBULATORY_CARE_PROVIDER_SITE_OTHER): Payer: Medicare Other | Admitting: Physician Assistant

## 2017-06-19 VITALS — BP 126/90 | HR 96 | Ht <= 58 in | Wt 123.0 lb

## 2017-06-19 DIAGNOSIS — I471 Supraventricular tachycardia: Secondary | ICD-10-CM

## 2017-06-19 DIAGNOSIS — I498 Other specified cardiac arrhythmias: Secondary | ICD-10-CM

## 2017-06-19 DIAGNOSIS — I10 Essential (primary) hypertension: Secondary | ICD-10-CM | POA: Diagnosis not present

## 2017-06-19 MED ORDER — DILTIAZEM HCL ER COATED BEADS 120 MG PO CP24: 120.0000 mg | ORAL_CAPSULE | Freq: Every day | ORAL | 11 refills | Status: DC

## 2017-06-19 MED FILL — CARTIA XT 120 MG CAPSULE SA: 120 | 30 days supply | Qty: 30 | Fill #0

## 2017-06-19 NOTE — Patient Instructions (Signed)
Medication Instructions:  Your physician recommends that you continue on your current medications as directed. Please refer to the Current Medication list given to you today.   Labwork: None Ordered   Testing/Procedures: None Ordered   Follow-Up: Your physician recommends that you schedule a follow-up appointment on Tuesday September 4th at 2:00  Any Other Special Instructions Will Be Listed Below (If Applicable).     If you need a refill on your cardiac medications before your next appointment, please call your pharmacy.

## 2017-06-27 ENCOUNTER — Telehealth: Payer: Self-pay | Admitting: Medical Oncology

## 2017-06-27 NOTE — Telephone Encounter (Signed)
Per [employer] pt insurance wil cover Proair as replacement for ventolin. Note to [facility]

## 2017-06-28 ENCOUNTER — Other Ambulatory Visit: Payer: Self-pay | Admitting: Medical Oncology

## 2017-06-28 DIAGNOSIS — C3431 Malignant neoplasm of lower lobe, right bronchus or lung: Secondary | ICD-10-CM

## 2017-06-28 MED ORDER — ALBUTEROL SULFATE HFA 108 (90 BASE) MCG/ACT IN AERS: 1.0000 | INHALATION_SPRAY | Freq: Four times a day (QID) | RESPIRATORY_TRACT | 2 refills | Status: DC | PRN

## 2017-07-03 ENCOUNTER — Ambulatory Visit: Payer: Self-pay | Admitting: Physician Assistant

## 2017-07-07 ENCOUNTER — Other Ambulatory Visit: Payer: Self-pay | Admitting: Medical Oncology

## 2017-07-18 ENCOUNTER — Other Ambulatory Visit (HOSPITAL_BASED_OUTPATIENT_CLINIC_OR_DEPARTMENT_OTHER): Payer: Medicare Other

## 2017-07-18 ENCOUNTER — Telehealth: Payer: Self-pay | Admitting: Internal Medicine

## 2017-07-18 ENCOUNTER — Encounter: Payer: Self-pay | Admitting: Internal Medicine

## 2017-07-18 ENCOUNTER — Ambulatory Visit (HOSPITAL_BASED_OUTPATIENT_CLINIC_OR_DEPARTMENT_OTHER): Payer: PRIVATE HEALTH INSURANCE | Admitting: Internal Medicine

## 2017-07-18 VITALS — BP 124/81 | HR 91 | Temp 98.6°F | Resp 18 | Ht <= 58 in | Wt 124.0 lb

## 2017-07-18 DIAGNOSIS — C3431 Malignant neoplasm of lower lobe, right bronchus or lung: Secondary | ICD-10-CM

## 2017-07-18 DIAGNOSIS — C3491 Malignant neoplasm of unspecified part of right bronchus or lung: Secondary | ICD-10-CM

## 2017-07-18 DIAGNOSIS — L600 Ingrowing nail: Secondary | ICD-10-CM

## 2017-07-18 DIAGNOSIS — Z5111 Encounter for antineoplastic chemotherapy: Secondary | ICD-10-CM

## 2017-07-18 DIAGNOSIS — Z95828 Presence of other vascular implants and grafts: Secondary | ICD-10-CM

## 2017-07-18 LAB — CBC WITH DIFFERENTIAL/PLATELET
BASO%: 0.3 % (ref 0.0–2.0)
Basophils Absolute: 0 10*3/uL (ref 0.0–0.1)
EOS%: 1.2 % (ref 0.0–7.0)
Eosinophils Absolute: 0.1 10*3/uL (ref 0.0–0.5)
HCT: 40.8 % (ref 34.8–46.6)
HGB: 13.5 g/dL (ref 11.6–15.9)
LYMPH%: 18.8 % (ref 14.0–49.7)
MCH: 28.3 pg (ref 25.1–34.0)
MCHC: 33.1 g/dL (ref 31.5–36.0)
MCV: 85.3 fL (ref 79.5–101.0)
MONO#: 0.4 10*3/uL (ref 0.1–0.9)
MONO%: 6.2 % (ref 0.0–14.0)
NEUT#: 5.1 10*3/uL (ref 1.5–6.5)
NEUT%: 73.5 % (ref 38.4–76.8)
Platelets: 291 10*3/uL (ref 145–400)
RBC: 4.79 10*6/uL (ref 3.70–5.45)
RDW: 14.4 % (ref 11.2–14.5)
WBC: 7 10*3/uL (ref 3.9–10.3)
lymph#: 1.3 10*3/uL (ref 0.9–3.3)

## 2017-07-18 LAB — COMPREHENSIVE METABOLIC PANEL
ALT: 20 U/L (ref 0–55)
AST: 25 U/L (ref 5–34)
Albumin: 3.8 g/dL (ref 3.5–5.0)
Alkaline Phosphatase: 86 U/L (ref 40–150)
Anion Gap: 7 mEq/L (ref 3–11)
BUN: 12.6 mg/dL (ref 7.0–26.0)
CO2: 31 mEq/L — ABNORMAL HIGH (ref 22–29)
Calcium: 9.8 mg/dL (ref 8.4–10.4)
Chloride: 103 mEq/L (ref 98–109)
Creatinine: 0.7 mg/dL (ref 0.6–1.1)
EGFR: >90 mL/min/{1.73_m2} (ref >90)
Glucose: 97 mg/dl (ref 70–140)
Potassium: 4.6 mEq/L (ref 3.5–5.1)
Sodium: 141 mEq/L (ref 136–145)
Total Bilirubin: 0.74 mg/dL (ref 0.20–1.20)
Total Protein: 7.6 g/dL (ref 6.4–8.3)

## 2017-07-18 MED ORDER — HYDROCOD POLST-CPM POLST ER 10-8 MG/5ML PO SUER: 5.0000 mL | Freq: Two times a day (BID) | ORAL | 0 refills | Status: DC

## 2017-07-18 MED ORDER — CLINDAMYCIN PHOSPHATE 1 % EX LOTN: TOPICAL_LOTION | Freq: Two times a day (BID) | CUTANEOUS | 0 refills | Status: DC

## 2017-07-18 MED ORDER — LIDOCAINE-PRILOCAINE 2.5-2.5 % EX CREA: 1.0000 "application " | TOPICAL_CREAM | CUTANEOUS | 0 refills | Status: DC | PRN

## 2017-07-18 MED ORDER — DOXYCYCLINE HYCLATE 100 MG PO TABS: 100.0000 mg | ORAL_TABLET | Freq: Two times a day (BID) | ORAL | 0 refills | Status: DC

## 2017-07-18 MED FILL — HYDROCODONE-CHLORPHENIRAM S: 10-8 | 14 days supply | Qty: 140 | Fill #0

## 2017-07-18 MED FILL — DOXYCYCLINE HYC 100 MG TAB: 100 | 7 days supply | Qty: 14 | Fill #0

## 2017-07-18 MED FILL — LIDOCAINE-PRILOCAINE CREAM: 2.5-2.5 | 30 days supply | Qty: 30 | Fill #0

## 2017-07-18 NOTE — Progress Notes (Signed)
[place] [phone]   Fax:([phone]  OFFICE PROGRESS NOTE  Golden Circle, FNP [place] [address]  PRINCIPAL DIAGNOSIS: Local recurrence of non-small cell lung cancer, adenocarcinoma, initially diagnosed as stage IB (T2a N0 M0) in March of 2010.   PRIOR THERAPY:  1. Status post right lower lobectomy under the care of Dr. Cyndia Bent on 04/08/2009. 2. Status post palliative radiotherapy to the right lower lobe recurrent lung mass under the care of Dr. Lisbeth Renshaw, completed on June 08, 2011. 3. Systemic chemotherapy with carboplatin for AUC of 5 and Alimta 500 mg/M2 every 3 weeks. She is status post 3 cycles.  CURRENT THERAPY: Tarceva 150 mg by mouth daily, therapy beginning 07/24/2012. Status post approximately 52 months of therapy.   INTERVAL HISTORY: [name] 56 y.o. female returns to the clinic today for follow-up visit accompanied by her interpreter. The patient is feeling fine today with no specific complaints except for the skin pimples on the eyebrows and nose. She also continues to have dry cough requesting refill of Tussionex. She denied having any chest pain, shortness of breath or hemoptysis. She denied having any fever or chills. She has no nausea, vomiting, diarrhea or constipation. She denied having any weight loss or night sweats. She is tolerating her treatment with Tarceva fairly well.   MEDICAL HISTORY: Past Medical History:  Diagnosis Date  . Drug-induced skin rash 02/27/2017  . Encounter for antineoplastic chemotherapy 01/19/2016  . History of radiation therapy 05/02/11 to 06/08/11   right lung  . Lung cancer ([place])    right lower lobe adenocarcinoma    ALLERGIES:  is allergic to codeine and doxycycline.  MEDICATIONS:  Current Outpatient Prescriptions  Medication Sig Dispense Refill  . albuterol (PROAIR HFA) 108 (90 Base) MCG/ACT inhaler Inhale 1-2 puffs into the lungs every 6 (six) hours as needed for wheezing or  shortness of breath. 1 Inhaler 2  . chlorpheniramine-HYDROcodone (TUSSIONEX) 10-8 MG/5ML SUER   0  . clindamycin (CLEOCIN T) 1 % lotion Apply topically 2 (two) times daily.    Marland Kitchen diltiazem (CARDIZEM CD) 120 MG 24 hr capsule Take 1 capsule (120 mg total) by mouth daily. 30 capsule 11  . lidocaine-prilocaine (EMLA) cream Apply 1 application topically as needed.    Marland Kitchen TARCEVA 150 MG tablet Take 1 tablet ( 150 mg total )  by mouth daily 30 tablet 10  . hydrocortisone 1 % ointment Apply 1 application topically 2 (two) times daily. 30 g 0   No current facility-administered medications for this visit.     SURGICAL HISTORY:  Past Surgical History:  Procedure Laterality Date  . LUNG LOBECTOMY  04/18/2009   RLL  . PORTACATH PLACEMENT  02/29/2012   Procedure: INSERTION PORT-A-CATH;  Surgeon: Nicanor Alcon, MD;  Location: [place];  Service: Thoracic;  Laterality: Left;  PowerPort 8 F attachable in left internal jugular.    REVIEW OF SYSTEMS:  A comprehensive review of systems was negative except for: Respiratory: positive for cough   PHYSICAL EXAMINATION: General appearance: alert, cooperative and no distress Head: Normocephalic, without obvious abnormality, atraumatic Neck: no adenopathy, no JVD, supple, symmetrical, trachea midline and thyroid not enlarged, symmetric, no tenderness/mass/nodules Lymph nodes: Cervical, supraclavicular, and axillary nodes normal. Resp: clear to auscultation bilaterally Back: symmetric, no curvature. ROM normal. No CVA tenderness. Cardio: regular rate and rhythm, S1, S2 normal, no murmur, click, rub or gallop GI: soft, non-tender; bowel sounds normal; no masses,  no organomegaly Extremities: extremities normal, atraumatic, no cyanosis or edema  ECOG PERFORMANCE STATUS: 1 - Symptomatic but completely ambulatory  Blood pressure 124/81, pulse 91, temperature 98.6 F (37 C), temperature source Oral, resp. rate 18, height 4\' 9"  (1.448 m), weight 124 lb (56.2 kg), last  menstrual period 02/10/2012, SpO2 99 %.  LABORATORY DATA: Lab Results  Component Value Date   WBC 7.0 07/18/2017   HGB 13.5 07/18/2017   HCT 40.8 07/18/2017   MCV 85.3 07/18/2017   PLT 291 07/18/2017      Chemistry      Component Value Date/Time   NA 141 06/14/2017 1025   K 4.3 06/14/2017 1025   CL 106 05/22/2017 0233   CL 102 04/30/2013 1002   CO2 31 (H) 06/14/2017 1025   BUN 12.3 06/14/2017 1025   CREATININE 0.7 06/14/2017 1025      Component Value Date/Time   CALCIUM 9.6 06/14/2017 1025   ALKPHOS 93 06/14/2017 1025   AST 30 06/14/2017 1025   ALT 28 06/14/2017 1025   BILITOT 0.98 06/14/2017 1025       RADIOGRAPHIC STUDIES: No results found.  ASSESSMENT AND PLAN:  This is a very pleasant 56 years old Asian female with recurrent non-small cell lung cancer, adenocarcinoma status post surgical resection followed by systemic chemotherapy with carboplatin and Alimta followed by disease progression and the patient was started on treatment with Tarceva 150 mg by mouth daily status post 52 months of treatment. The patient continues to tolerate her treatment well with no significant adverse effects. I recommended for her to continue her current treatment with Tarceva. I will see her back for follow-up visit in one month's for evaluation after repeating CT scan of the chest for restaging of her disease. She requested a refill of several of her medication including Tussionex, clindamycin lotion, Emla cream as well as doxycycline. The patient was advised to call immediately if she has any concerning symptoms in the interval. The patient voices understanding of current disease status and treatment options and is in agreement with the current care plan. All questions were answered. The patient knows to call the clinic with any problems, questions or concerns. We can certainly see the patient much sooner if necessary. I spent 10 minutes counseling the patient face to face. The total  time spent in the appointment was 15 minutes.  Disclaimer: This note was dictated with voice recognition software. Similar sounding words can inadvertently be transcribed and may not be corrected upon review.

## 2017-07-18 NOTE — Telephone Encounter (Signed)
Scheduled appt per 8/21 os - Gave patient AVS and calender per los. Central radiology to contact patient with ct schedule.

## 2017-07-21 ENCOUNTER — Encounter: Payer: Self-pay | Admitting: Physician Assistant

## 2017-08-01 ENCOUNTER — Ambulatory Visit: Payer: Self-pay | Admitting: Physician Assistant
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
## 2017-08-03 ENCOUNTER — Ambulatory Visit (INDEPENDENT_AMBULATORY_CARE_PROVIDER_SITE_OTHER): Payer: Medicare Other | Admitting: Physician Assistant

## 2017-08-03 VITALS — BP 117/82 | HR 89 | Ht <= 58 in | Wt 122.0 lb

## 2017-08-03 DIAGNOSIS — I471 Supraventricular tachycardia: Secondary | ICD-10-CM

## 2017-08-03 MED ORDER — DILTIAZEM HCL ER COATED BEADS 120 MG PO CP24: 120.0000 mg | ORAL_CAPSULE | Freq: Every day | ORAL | 4 refills | Status: DC

## 2017-08-03 MED FILL — CARTIA XT 120 MG CAPSULE SA: 120 | 90 days supply | Qty: 90 | Fill #0

## 2017-08-03 NOTE — Patient Instructions (Signed)
Medication Instructions:   Your physician recommends that you continue on your current medications as directed. Please refer to the Current Medication list given to you today.   If you need a refill on your cardiac medications before your next appointment, please call your pharmacy.  Labwork: NONE ORDERED  TODAY    Testing/Procedures: NONE ORDERED  TODAY    Follow-Up: Your physician wants you to follow-up in:  [place]  [place] will receive a reminder letter in the mail two months in advance. If you don't receive a letter, please call our office to schedule the follow-up appointment.      Any Other Special Instructions Will Be Listed Below (If Applicable).

## 2017-08-03 NOTE — Addendum Note (Signed)
Addended by: Claude Manges on: 08/03/2017 10:53 AM   Modules accepted: Orders

## 2017-08-11 ENCOUNTER — Telehealth: Payer: Self-pay

## 2017-08-11 DIAGNOSIS — C3431 Malignant neoplasm of lower lobe, right bronchus or lung: Secondary | ICD-10-CM

## 2017-08-11 DIAGNOSIS — Z5111 Encounter for antineoplastic chemotherapy: Secondary | ICD-10-CM

## 2017-08-11 MED ORDER — CLINDAMYCIN PHOSPHATE 1 % EX LOTN: TOPICAL_LOTION | Freq: Two times a day (BID) | CUTANEOUS | 0 refills | Status: DC

## 2017-08-11 NOTE — Telephone Encounter (Signed)
Melissa called for refill of her cream for her eyebrows. reorderd clindamycin per protocol.

## 2017-08-17 ENCOUNTER — Ambulatory Visit (HOSPITAL_COMMUNITY)
Admission: RE | Admit: 2017-08-17 | Discharge: 2017-08-17 | Disposition: A | Discharge status: Home / Self Care | Payer: Medicare Other | Source: Ambulatory Visit | Attending: Internal Medicine | Admitting: Internal Medicine

## 2017-08-17 ENCOUNTER — Telehealth: Payer: Self-pay | Admitting: *Deleted

## 2017-08-17 DIAGNOSIS — J439 Emphysema, unspecified: Secondary | ICD-10-CM | POA: Diagnosis not present

## 2017-08-17 DIAGNOSIS — I700 Atherosclerosis of aorta: Secondary | ICD-10-CM | POA: Insufficient documentation

## 2017-08-17 DIAGNOSIS — J90 Pleural effusion, not elsewhere classified: Secondary | ICD-10-CM | POA: Insufficient documentation

## 2017-08-17 DIAGNOSIS — C3431 Malignant neoplasm of lower lobe, right bronchus or lung: Secondary | ICD-10-CM | POA: Diagnosis not present

## 2017-08-17 DIAGNOSIS — Z5111 Encounter for antineoplastic chemotherapy: Secondary | ICD-10-CM

## 2017-08-17 MED ORDER — HEPARIN SOD (PORK) LOCK FLUSH 100 UNIT/ML IV SOLN
500.0000 [IU] | Freq: Once | INTRAVENOUS | Status: AC
  Administered 2017-08-17: 500 [IU] via INTRAVENOUS

## 2017-08-17 MED ORDER — HEPARIN SOD (PORK) LOCK FLUSH 100 UNIT/ML IV SOLN
INTRAVENOUS | Status: AC
  Filled 2017-08-17: qty 5

## 2017-08-17 MED ORDER — IOPAMIDOL (ISOVUE-300) INJECTION 61%
INTRAVENOUS | Status: AC
  Filled 2017-08-17: qty 75

## 2017-08-17 MED ORDER — IOPAMIDOL (ISOVUE-300) INJECTION 61%
75.0000 mL | Freq: Once | INTRAVENOUS | Status: AC | PRN
  Administered 2017-08-17: 75 mL via INTRAVENOUS

## 2017-08-17 NOTE — Telephone Encounter (Signed)
Oncology Nurse Navigator Documentation  Oncology Nurse Navigator Flowsheets 08/17/2017  Navigator Location [facility]  Navigator Encounter Type Telephone/I followed up on [name] schedule. She needs CT scan before she sees Dr. Julien Nordmann on 9/24.  I called and updated.  I also gave the number to central scheduling to call and make an appt for CT scan   Telephone Outgoing Call  Barriers/Navigation Needs Coordination of Care;Education  Education Other  Interventions Coordination of Care;Education  Coordination of Care Other  Education Method Verbal  Acuity Level 2  Time Spent with Patient 30

## 2017-08-18 ENCOUNTER — Other Ambulatory Visit: Payer: Self-pay

## 2017-08-18 ENCOUNTER — Other Ambulatory Visit: Payer: Self-pay | Admitting: *Deleted

## 2017-08-18 DIAGNOSIS — Z5111 Encounter for antineoplastic chemotherapy: Secondary | ICD-10-CM

## 2017-08-18 DIAGNOSIS — C3431 Malignant neoplasm of lower lobe, right bronchus or lung: Secondary | ICD-10-CM

## 2017-08-18 MED ORDER — CLINDAMYCIN PHOSPHATE 1 % EX LOTN: TOPICAL_LOTION | Freq: Two times a day (BID) | CUTANEOUS | 1 refills | Status: DC

## 2017-08-21 ENCOUNTER — Encounter: Payer: Self-pay | Admitting: Internal Medicine

## 2017-08-21 ENCOUNTER — Telehealth: Payer: Self-pay | Admitting: Internal Medicine

## 2017-08-21 ENCOUNTER — Ambulatory Visit (HOSPITAL_BASED_OUTPATIENT_CLINIC_OR_DEPARTMENT_OTHER): Payer: Medicare Other | Admitting: Internal Medicine

## 2017-08-21 DIAGNOSIS — C3491 Malignant neoplasm of unspecified part of right bronchus or lung: Secondary | ICD-10-CM

## 2017-08-21 DIAGNOSIS — C3431 Malignant neoplasm of lower lobe, right bronchus or lung: Secondary | ICD-10-CM

## 2017-08-21 DIAGNOSIS — R05 Cough: Secondary | ICD-10-CM

## 2017-08-21 DIAGNOSIS — Z95828 Presence of other vascular implants and grafts: Secondary | ICD-10-CM | POA: Diagnosis not present

## 2017-08-21 DIAGNOSIS — Z5111 Encounter for antineoplastic chemotherapy: Secondary | ICD-10-CM

## 2017-08-21 MED ORDER — HYDROCOD POLST-CPM POLST ER 10-8 MG/5ML PO SUER: 5.0000 mL | Freq: Two times a day (BID) | ORAL | 0 refills | Status: DC

## 2017-08-21 MED FILL — HYDROCODONE-CHLORPHENIRAM S: 10-8 | 28 days supply | Qty: 280 | Fill #0

## 2017-08-21 NOTE — Telephone Encounter (Signed)
Scheduled appt per 9/24 los - per patient request one week after 6 week . Gave patient AVS and calender per los.

## 2017-08-21 NOTE — Progress Notes (Signed)
[name] Telephone:([phone]) [phone]   Fax:(336) [phone]  OFFICE PROGRESS NOTE  Golden Circle, FNP [place] [address]  PRINCIPAL DIAGNOSIS: Local recurrence of non-small cell lung cancer, adenocarcinoma, initially diagnosed as stage IB (T2a N0 M0) in March of 2010.   PRIOR THERAPY:  1. Status post right lower lobectomy under the care of Dr. Cyndia Bent on 04/08/2009. 2. Status post palliative radiotherapy to the right lower lobe recurrent lung mass under the care of Dr. Lisbeth Renshaw, completed on June 08, 2011. 3. Systemic chemotherapy with carboplatin for AUC of 5 and Alimta 500 mg/M2 every 3 weeks. She is status post 3 cycles.  CURRENT THERAPY: Tarceva 150 mg by mouth daily, therapy beginning 07/24/2012. Status post approximately 53 months of therapy.   INTERVAL HISTORY: [name] 56 y.o. female returns to the clinic today for follow-up visit accompanied by her interpreter. The patient is feeling fine today with no specific complaints except for the dry cough and she is requesting refill of Tussionex. She denied having any chest pain, shortness of breath, or hemoptysis. She has no recent weight loss or night sweats. She denied having any nausea, vomiting, diarrhea or constipation. She continues to have mild skin rash. She is tolerating her treatment with Tarceva fairly well. The patient had repeat CT scan of the chest performed recently and she is here for evaluation and discussion of her scan results.    MEDICAL HISTORY: Past Medical History:  Diagnosis Date  . Drug-induced skin rash 02/27/2017  . Encounter for antineoplastic chemotherapy 01/19/2016  . History of radiation therapy 05/02/11 to 06/08/11   right lung  . Lung cancer ([place])    right lower lobe adenocarcinoma    ALLERGIES:  is allergic to codeine and doxycycline.  MEDICATIONS:  Current Outpatient Prescriptions  Medication Sig Dispense Refill  . albuterol (PROAIR HFA) 108 (90 Base) MCG/ACT  inhaler Inhale 1-2 puffs into the lungs every 6 (six) hours as needed for wheezing or shortness of breath. 1 Inhaler 2  . chlorpheniramine-HYDROcodone (TUSSIONEX) 10-8 MG/5ML SUER Take 5 mLs by mouth 2 (two) times daily. 140 mL 0  . clindamycin (CLEOCIN T) 1 % lotion Apply topically 2 (two) times daily. 120 mL 1  . diltiazem (CARDIZEM CD) 120 MG 24 hr capsule Take 1 capsule (120 mg total) by mouth daily. 90 capsule 4  . doxycycline (VIBRA-TABS) 100 MG tablet Take 1 tablet (100 mg total) by mouth 2 (two) times daily. 14 tablet 0  . hydrocortisone 1 % ointment Apply 1 application topically 2 (two) times daily. 30 g 0  . lidocaine-prilocaine (EMLA) cream Apply 1 application topically as needed. 30 g 0  . TARCEVA 150 MG tablet Take 1 tablet ( 150 mg total )  by mouth daily 30 tablet 10   No current facility-administered medications for this visit.     SURGICAL HISTORY:  Past Surgical History:  Procedure Laterality Date  . LUNG LOBECTOMY  04/18/2009   RLL  . PORTACATH PLACEMENT  02/29/2012   Procedure: INSERTION PORT-A-CATH;  Surgeon: Nicanor Alcon, MD;  Location: [place];  Service: Thoracic;  Laterality: Left;  PowerPort 8 F attachable in left internal jugular.    REVIEW OF SYSTEMS:  Constitutional: positive for fatigue Eyes: negative Ears, nose, mouth, throat, and face: negative Respiratory: positive for cough Cardiovascular: negative Gastrointestinal: negative Genitourinary:negative Integument/breast: negative Hematologic/lymphatic: negative Musculoskeletal:negative Neurological: negative Behavioral/Psych: negative Endocrine: negative Allergic/Immunologic: negative   PHYSICAL EXAMINATION: General appearance:  alert, cooperative and no distress Head: Normocephalic, without obvious abnormality, atraumatic Neck: no adenopathy, no JVD, supple, symmetrical, trachea midline and thyroid not enlarged, symmetric, no tenderness/mass/nodules Lymph nodes: Cervical, supraclavicular, and axillary  nodes normal. Resp: clear to auscultation bilaterally Back: symmetric, no curvature. ROM normal. No CVA tenderness. Cardio: regular rate and rhythm, S1, S2 normal, no murmur, click, rub or gallop GI: soft, non-tender; bowel sounds normal; no masses,  no organomegaly Extremities: extremities normal, atraumatic, no cyanosis or edema Neurologic: Alert and oriented X 3, normal strength and tone. Normal symmetric reflexes. Normal coordination and gait  ECOG PERFORMANCE STATUS: 1 - Symptomatic but completely ambulatory  Blood pressure 120/78, pulse 93, temperature 98.3 F (36.8 C), temperature source Oral, resp. rate 18, height 4\' 9"  (1.448 m), weight 125 lb 1.6 oz (56.7 kg), last menstrual period 02/10/2012, SpO2 100 %.  LABORATORY DATA: Lab Results  Component Value Date   WBC 7.0 07/18/2017   HGB 13.5 07/18/2017   HCT 40.8 07/18/2017   MCV 85.3 07/18/2017   PLT 291 07/18/2017      Chemistry      Component Value Date/Time   NA 141 07/18/2017 0935   K 4.6 07/18/2017 0935   CL 106 05/22/2017 0233   CL 102 04/30/2013 1002   CO2 31 (H) 07/18/2017 0935   BUN 12.6 07/18/2017 0935   CREATININE 0.7 07/18/2017 0935      Component Value Date/Time   CALCIUM 9.8 07/18/2017 0935   ALKPHOS 86 07/18/2017 0935   AST 25 07/18/2017 0935   ALT 20 07/18/2017 0935   BILITOT 0.74 07/18/2017 0935       RADIOGRAPHIC STUDIES: Ct Chest W Contrast  Result Date: 08/18/2017 CLINICAL DATA:  Right lung cancer diagnosed 3/10. Right lobectomy. Radiation therapy complete. Ongoing oral chemotherapy. EXAM: CT CHEST WITH CONTRAST TECHNIQUE: Multidetector CT imaging of the chest was performed during intravenous contrast administration. CONTRAST:  34mL ISOVUE-300 IOPAMIDOL (ISOVUE-300) INJECTION 61% COMPARISON:  05/21/2017.  04/03/2017. FINDINGS: Cardiovascular: A left-sided Port-A-Cath which terminates at the high right atrium. Tortuous thoracic aorta. Mild cardiomegaly, without pericardial effusion. No central  pulmonary embolism, on this non-dedicated study. Mediastinum/Nodes: No supraclavicular adenopathy. No mediastinal or hilar adenopathy. A right cardiophrenic angle node is mildly enlarged but similar at 8 mm on image 84/series 2. Lungs/Pleura: Small right-sided pleural effusion is similar. Minimal loculation anteriorly. Smooth parietal pleural thickening is likely related to chronicity. This is similar. No nodular areas of pleural thickening or irregularity. At least partial right lower lobectomy. Mild centrilobular emphysema. Surgical sutures in the right apex with similar surrounding soft tissue thickening, including on image 31/series 5. A 3 mm right lung pulmonary nodule on image 47/series 5 is unchanged. Clear left lung. Presumed radiation fibrosis within the medial inferior right lung is again identified. This is similar in distribution and configuration. A somewhat more masslike component inferiorly measures 3.1 x 3.6 cm on image 65/series 2. Compare 3.6 x 3.5 cm on 04/03/2017 (when remeasured). Upper Abdomen: Normal imaged portions of the liver, spleen, stomach, pancreas, adrenal glands, kidneys. Musculoskeletal: Remote left clavicular trauma. Postsurgical and/or radiation induced defects within posterior upper right ribs. IMPRESSION: 1. Treatment changes throughout the right hemithorax. No interval change or evidence of metastatic disease. 2. Right pleural effusion with mild loculation and pleural thickening, not significantly changed. 3.  Emphysema (ICD10-J43.9). 4.  Aortic Atherosclerosis (ICD10-I70.0). Electronically Signed   By: Abigail Miyamoto M.D.   On: 08/18/2017 09:05    ASSESSMENT AND PLAN:  This is a very  pleasant 56 years old Asian female with recurrent non-small cell lung cancer, adenocarcinoma status post surgical resection followed by systemic chemotherapy with carboplatin and Alimta followed by disease progression and the patient was started on treatment with Tarceva 150 mg by mouth daily  status post 53 months of treatment. The patient continues to tolerate her treatment fairly well. She had repeat CT scan of the chest performed recently. I personally and independently reviewed the scan and discuss the results with the patient today. Her scan showed no evidence for disease progression. I recommended for the patient to continue her current treatment with Tarceva for now. I will see her back for follow-up visit in 6 weeks after she returned from her trip to [place].  For the dry cough, I gave the patient refill of Tussionex today. She was advised to call immediately if she has any concerning symptoms in the interval.. The patient voices understanding of current disease status and treatment options and is in agreement with the current care plan. All questions were answered. The patient knows to call the clinic with any problems, questions or concerns. We can certainly see the patient much sooner if necessary.  Disclaimer: This note was dictated with voice recognition software. Similar sounding words can inadvertently be transcribed and may not be corrected upon review.

## 2017-10-05 ENCOUNTER — Encounter (HOSPITAL_COMMUNITY): Payer: Self-pay | Admitting: *Deleted

## 2017-10-05 ENCOUNTER — Telehealth: Payer: Self-pay

## 2017-10-05 ENCOUNTER — Emergency Department (HOSPITAL_COMMUNITY)
Admission: EM | Admit: 2017-10-05 | Discharge: 2017-10-05 | Disposition: A | Discharge status: Home / Self Care | Payer: Medicare Other | Attending: Emergency Medicine | Admitting: Emergency Medicine

## 2017-10-05 ENCOUNTER — Emergency Department (HOSPITAL_COMMUNITY): Payer: Medicare Other

## 2017-10-05 DIAGNOSIS — H66003 Acute suppurative otitis media without spontaneous rupture of ear drum, bilateral: Secondary | ICD-10-CM | POA: Diagnosis not present

## 2017-10-05 DIAGNOSIS — I10 Essential (primary) hypertension: Secondary | ICD-10-CM | POA: Diagnosis not present

## 2017-10-05 DIAGNOSIS — Z79899 Other long term (current) drug therapy: Secondary | ICD-10-CM | POA: Diagnosis not present

## 2017-10-05 DIAGNOSIS — R05 Cough: Secondary | ICD-10-CM | POA: Diagnosis not present

## 2017-10-05 LAB — COMPREHENSIVE METABOLIC PANEL
ALT: 25 U/L (ref 14–54)
AST: 27 U/L (ref 15–41)
Albumin: 3.7 g/dL (ref 3.5–5.0)
Alkaline Phosphatase: 93 U/L (ref 38–126)
Anion gap: 10 (ref 5–15)
BUN: 8 mg/dL (ref 6–20)
CO2: 28 mmol/L (ref 22–32)
Calcium: 8.6 mg/dL — ABNORMAL LOW (ref 8.9–10.3)
Chloride: 103 mmol/L (ref 101–111)
Creatinine, Ser: 0.57 mg/dL (ref 0.44–1.00)
GFR calc Af Amer: >60 mL/min (ref >60)
GFR calc non Af Amer: >60 mL/min (ref >60)
Glucose, Bld: 90 mg/dL (ref 65–99)
Potassium: 3.5 mmol/L (ref 3.5–5.1)
Sodium: 141 mmol/L (ref 135–145)
Total Bilirubin: 0.8 mg/dL (ref 0.3–1.2)
Total Protein: 7.3 g/dL (ref 6.5–8.1)

## 2017-10-05 LAB — CBC WITH DIFFERENTIAL/PLATELET
Basophils Absolute: 0 10*3/uL (ref 0.0–0.1)
Basophils Relative: 0 %
Eosinophils Absolute: 0.1 10*3/uL (ref 0.0–0.7)
Eosinophils Relative: 1 %
HCT: 39.3 % (ref 36.0–46.0)
Hemoglobin: 12.7 g/dL (ref 12.0–15.0)
Lymphocytes Relative: 16 %
Lymphs Abs: 1 10*3/uL (ref 0.7–4.0)
MCH: 28.3 pg (ref 26.0–34.0)
MCHC: 32.3 g/dL (ref 30.0–36.0)
MCV: 87.7 fL (ref 78.0–100.0)
Monocytes Absolute: 0.7 10*3/uL (ref 0.1–1.0)
Monocytes Relative: 11 %
Neutro Abs: 4.5 10*3/uL (ref 1.7–7.7)
Neutrophils Relative %: 72 %
Platelets: 278 10*3/uL (ref 150–400)
RBC: 4.48 MIL/uL (ref 3.87–5.11)
RDW: 14.3 % (ref 11.5–15.5)
WBC: 6.3 10*3/uL (ref 4.0–10.5)

## 2017-10-05 MED ORDER — AMOXICILLIN 500 MG PO CAPS: 1000.0000 mg | ORAL_CAPSULE | Freq: Two times a day (BID) | ORAL | 0 refills | Status: DC

## 2017-10-05 MED ORDER — OSELTAMIVIR PHOSPHATE 75 MG PO CAPS: 75.0000 mg | ORAL_CAPSULE | Freq: Two times a day (BID) | ORAL | 0 refills | Status: DC

## 2017-10-05 MED ORDER — BACITRACIN ZINC 500 UNIT/GM EX OINT
TOPICAL_OINTMENT | CUTANEOUS | Status: AC
  Filled 2017-10-05: qty 0.9

## 2017-10-05 MED ORDER — SODIUM CHLORIDE 0.9 % IV BOLUS (SEPSIS)
1000.0000 mL | Freq: Once | INTRAVENOUS | Status: AC
Start: 2017-10-05 — End: 2017-10-05
  Administered 2017-10-05: 1000 mL via INTRAVENOUS

## 2017-10-05 MED ORDER — HEPARIN SOD (PORK) LOCK FLUSH 100 UNIT/ML IV SOLN
500.0000 [IU] | Freq: Once | INTRAVENOUS | Status: AC
Start: 2017-10-05 — End: 2017-10-05
  Administered 2017-10-05: 500 [IU]
  Filled 2017-10-05: qty 5

## 2017-10-05 NOTE — ED Triage Notes (Signed)
Pt complains of nasal congestion, cough, sore throat x 3 days. Pt is on oral chemo.

## 2017-10-05 NOTE — ED Provider Notes (Signed)
[place] DEPT Provider Note   CSN: [id number] Arrival date & time: 10/05/17  1704     History   Chief Complaint Chief Complaint  Patient presents with  . URI    on oral chemo    HPI [name] is a 56 y.o. female.  56 yo F with a chief complaint of cough congestion fevers chills.  Going on for the past couple days.  The patient has been taking care of 2 small children that were both independently tested and found to have influenza.  The patient is on an oral chemotherapy agent for lung cancer.  She denies nausea vomiting or diarrhea.  Denies abdominal pain.  Denies myalgias.  Denies ear pain.  Denies sinus pain.  Patient has felt mildly lightheaded today.   The history is provided by the patient.  Illness  This is a new problem. The current episode started 2 days ago. The problem occurs constantly. The problem has not changed since onset.Pertinent negatives include no chest pain, no headaches and no shortness of breath. Nothing aggravates the symptoms. Nothing relieves the symptoms. She has tried nothing for the symptoms. The treatment provided no relief.    Past Medical History:  Diagnosis Date  . Drug-induced skin rash 02/27/2017  . Encounter for antineoplastic chemotherapy 01/19/2016  . History of radiation therapy 05/02/11 to 06/08/11   right lung  . Lung cancer [facility])    right lower lobe adenocarcinoma    Patient Active Problem List   Diagnosis Date Noted  . Seasonal allergic rhinitis due to pollen 06/15/2017  . Essential hypertension 05/23/2017  . Dyspnea 05/21/2017  . Acute upper respiratory infection 05/21/2017  . Atrial arrhythmia 05/21/2017  . Drug-induced skin rash 02/27/2017  . Port catheter in place 12/27/2016  . Encounter for antineoplastic chemotherapy 06/13/2016  . GERD (gastroesophageal reflux disease) 04/18/2016  . Primary cancer of right lower lobe of lung ([place]) 05/06/2009  . LEIOMYOMA, UTERUS 05/06/2009    Past Surgical  History:  Procedure Laterality Date  . LUNG LOBECTOMY  04/18/2009   RLL    OB History    No data available       Home Medications    Prior to Admission medications   Medication Sig Start Date End Date Taking? Authorizing Provider  acetaminophen (TYLENOL) 500 MG tablet Take 500 mg every 6 (six) hours as needed by mouth.   Yes [provider]  diltiazem (CARDIZEM CD) 120 MG 24 hr capsule Take 1 capsule (120 mg total) by mouth daily. 08/03/17  Yes Baldwin [place], PA-C  TARCEVA 150 MG tablet Take 1 tablet ( 150 mg total )  by mouth daily 03/02/17  Yes Curt Bears, MD  albuterol [facility] HFA) 108 (90 Base) MCG/ACT inhaler Inhale 1-2 puffs into the lungs every 6 (six) hours as needed for wheezing or shortness of breath. 06/28/17   Curt Bears, MD  amoxicillin (AMOXIL) 500 MG capsule Take 2 capsules (1,000 mg total) 2 (two) times daily by mouth. 10/05/17   Deno Etienne, DO  chlorpheniramine-HYDROcodone (TUSSIONEX) 10-8 MG/5ML SUER Take 5 mLs by mouth 2 (two) times daily. Patient not taking: Reported on 10/05/2017 08/21/17   Curt Bears, MD  clindamycin (CLEOCIN T) 1 % lotion Apply topically 2 (two) times daily. Patient not taking: Reported on 10/05/2017 08/18/17   Curt Bears, MD  doxycycline (VIBRA-TABS) 100 MG tablet Take 1 tablet (100 mg total) by mouth 2 (two) times daily. Patient not taking: Reported on 10/05/2017 07/18/17  Curt Bears, MD  hydrocortisone 1 % ointment Apply 1 application topically 2 (two) times daily. Patient not taking: Reported on 10/05/2017 02/27/17   Curt Bears, MD  lidocaine-prilocaine (EMLA) cream Apply 1 application topically as needed. Patient not taking: Reported on 10/05/2017 07/18/17   Curt Bears, MD  oseltamivir (TAMIFLU) 75 MG capsule Take 1 capsule (75 mg total) every 12 (twelve) hours by mouth. 10/05/17   Deno Etienne, DO    Family History Family History  Problem Relation Age of Onset  . Heart Problems Mother   . Cancer Neg  Hx     Social History Social History   Tobacco Use  . Smoking status: Never Smoker  . Smokeless tobacco: Never Used  Substance Use Topics  . Alcohol use: No  . Drug use: No     Allergies   Codeine and Doxycycline   Review of Systems Review of Systems  Constitutional: Positive for chills and fever.  HENT: Positive for congestion. Negative for rhinorrhea.   Eyes: Negative for redness and visual disturbance.  Respiratory: Positive for cough. Negative for shortness of breath and wheezing.   Cardiovascular: Negative for chest pain and palpitations.  Gastrointestinal: Negative for nausea and vomiting.  Genitourinary: Negative for dysuria and urgency.  Musculoskeletal: Negative for arthralgias and myalgias.  Skin: Negative for pallor and wound.  Neurological: Negative for dizziness and headaches.     Physical Exam Updated Vital Signs BP 129/81   Pulse 88   Temp 97.7 F (36.5 C) (Oral)   Resp 18   LMP 02/10/2012   SpO2 100%   Physical Exam  Constitutional: She is oriented to person, place, and time. She appears well-developed and well-nourished. No distress.  HENT:  Head: Normocephalic and atraumatic.  TMs with bilateral purulent effusions and erythema.Swollen turbinates, posterior nasal drip, no noted sinus ttp, tm normal bilaterally.    Eyes: EOM are normal. Pupils are equal, round, and reactive to light.  Neck: Normal range of motion. Neck supple.  Cardiovascular: Normal rate and regular rhythm. Exam reveals no gallop and no friction rub.  No murmur heard. Pulmonary/Chest: Effort normal. She has no wheezes. She has no rales.  Diminished breath sounds in the right lower lobe  Abdominal: Soft. She exhibits no distension. There is no tenderness.  Musculoskeletal: She exhibits no edema or tenderness.  Neurological: She is alert and oriented to person, place, and time.  Skin: Skin is warm and dry. She is not diaphoretic.  Psychiatric: She has a normal mood and  affect. Her behavior is normal.  Nursing note and vitals reviewed.    ED Treatments / Results  Labs (all labs ordered are listed, but only abnormal results are displayed) Labs Reviewed  COMPREHENSIVE METABOLIC PANEL - Abnormal; Notable for the following components:      Result Value   Calcium 8.6 (*)    All other components within normal limits  CULTURE, BLOOD (ROUTINE X 2)  CULTURE, BLOOD (ROUTINE X 2)  CBC WITH DIFFERENTIAL/PLATELET    EKG  EKG Interpretation None       Radiology Dg Chest 2 View  Result Date: 10/05/2017 CLINICAL DATA:  Pts Grandson states he has influenza A. Pt was exposed. Pt states she has a cough and congestion for 3 days. Pt has no other chest complaints at this time. Pt taking oral chemo due to lung CA PT HX: non smoker, port placement 2013, lung lobectomy RLL 2010 EXAM: CHEST  2 VIEW COMPARISON:  Chest x-ray dated 05/21/2017. FINDINGS: Stable right  pleural effusion, small to moderate in size. Lungs remain otherwise clear. Heart size and mediastinal contours are stable. Left chest wall Port-A-Cath is stable in position with tip at the level of the lower SVC. No acute or suspicious osseous finding. IMPRESSION: No active cardiopulmonary disease. No evidence of pneumonia or pulmonary edema. Stable right pleural effusion, small to moderate in size. Electronically Signed   By: Franki Cabot M.D.   On: 10/05/2017 19:16    Procedures Procedures (including critical care time)  Medications Ordered in ED Medications  sodium chloride 0.9 % bolus 1,000 mL (1,000 mLs Intravenous New Bag/Given 10/05/17 2000)     Initial Impression / Assessment and Plan / ED Course  I have reviewed the triage vital signs and the nursing notes.  Pertinent labs & imaging results that were available during my care of the patient were reviewed by me and considered in my medical decision making (see chart for details).     56 yo F with a chief complaint of flulike symptoms.  She was  also recently exposed to the flu.  She is currently on a chemotherapy bioagent.  Will obtain labs, cxr, cx.   Treat for otitis and flu.  D/c home.   10:06 PM:  I have discussed the diagnosis/risks/treatment options with the patient and family and believe the pt to be eligible for discharge home to follow-up with PCP. We also discussed returning to the ED immediately if new or worsening sx occur. We discussed the sx which are most concerning (e.g., sudden worsening pain, fever, inability to tolerate by mouth) that necessitate immediate return. Medications administered to the patient during their visit and any new prescriptions provided to the patient are listed below.  Medications given during this visit Medications  sodium chloride 0.9 % bolus 1,000 mL (1,000 mLs Intravenous New Bag/Given 10/05/17 2000)     The patient appears reasonably screen and/or stabilized for discharge and I doubt any other medical condition or other [facility] requiring further screening, evaluation, or treatment in the ED at this time prior to discharge.    Final Clinical Impressions(s) / ED Diagnoses   Final diagnoses:  Acute suppurative otitis media of both ears without spontaneous rupture of tympanic membranes, recurrence not specified    ED Discharge Orders        Ordered    amoxicillin (AMOXIL) 500 MG capsule  2 times daily     10/05/17 2201    oseltamivir (TAMIFLU) 75 MG capsule  Every 12 hours     10/05/17 [place], Angelys Yetman, DO 10/05/17 2206

## 2017-10-05 NOTE — Telephone Encounter (Signed)
Grandson has influenza A. Pt was exposed. Called daughter back and she said pt was already going to ER b/c she does not feel well. Explained to tell ER doctor about exposure and they will address the issue.

## 2017-10-05 NOTE — Discharge Instructions (Signed)
Take tylenol 2 pills 4 times a day and motrin 4 pills 3 times a day.  Drink plenty of fluids.  Return for worsening shortness of breath, headache, confusion. Follow up with your family doctor.   

## 2017-10-05 NOTE — ED Notes (Addendum)
Interpreter used and patient verbalized understanding of D/C. [place] audio interpretation via stratus.

## 2017-10-06 ENCOUNTER — Other Ambulatory Visit: Payer: Self-pay | Admitting: Internal Medicine

## 2017-10-06 DIAGNOSIS — Z5111 Encounter for antineoplastic chemotherapy: Secondary | ICD-10-CM

## 2017-10-06 DIAGNOSIS — C3431 Malignant neoplasm of lower lobe, right bronchus or lung: Secondary | ICD-10-CM

## 2017-10-09 ENCOUNTER — Telehealth: Payer: Self-pay | Admitting: Internal Medicine

## 2017-10-09 ENCOUNTER — Encounter: Payer: Self-pay | Admitting: Internal Medicine

## 2017-10-09 ENCOUNTER — Ambulatory Visit (HOSPITAL_BASED_OUTPATIENT_CLINIC_OR_DEPARTMENT_OTHER): Payer: Medicare Other | Admitting: Internal Medicine

## 2017-10-09 DIAGNOSIS — C3491 Malignant neoplasm of unspecified part of right bronchus or lung: Secondary | ICD-10-CM

## 2017-10-09 DIAGNOSIS — Z5111 Encounter for antineoplastic chemotherapy: Secondary | ICD-10-CM

## 2017-10-09 DIAGNOSIS — Z923 Personal history of irradiation: Secondary | ICD-10-CM | POA: Diagnosis not present

## 2017-10-09 DIAGNOSIS — Z95828 Presence of other vascular implants and grafts: Secondary | ICD-10-CM

## 2017-10-09 DIAGNOSIS — R05 Cough: Secondary | ICD-10-CM

## 2017-10-09 DIAGNOSIS — C3431 Malignant neoplasm of lower lobe, right bronchus or lung: Secondary | ICD-10-CM

## 2017-10-09 DIAGNOSIS — Z9221 Personal history of antineoplastic chemotherapy: Secondary | ICD-10-CM | POA: Diagnosis not present

## 2017-10-09 DIAGNOSIS — C3490 Malignant neoplasm of unspecified part of unspecified bronchus or lung: Secondary | ICD-10-CM

## 2017-10-09 MED ORDER — HYDROCOD POLST-CPM POLST ER 10-8 MG/5ML PO SUER: 5.0000 mL | Freq: Two times a day (BID) | ORAL | 0 refills | Status: DC

## 2017-10-09 MED FILL — HYDROCODONE-CHLORPHENIRAM S: 10-8 | 28 days supply | Qty: 280 | Fill #0

## 2017-10-09 NOTE — Progress Notes (Signed)
[facility] Telephone:([phone]) [phone]   [phone]) [phone]  OFFICE PROGRESS NOTE  Golden Circle, FNP [address]  [place] [address]  PRINCIPAL DIAGNOSIS: Local recurrence of non-small cell lung cancer, adenocarcinoma, initially diagnosed as stage IB (T2a N0 M0) in March of 2010.   PRIOR THERAPY:  1. Status post right lower lobectomy under the care of Dr. Cyndia Bent on 04/08/2009. 2. Status post palliative radiotherapy to the right lower lobe recurrent lung mass under the care of Dr. Lisbeth Renshaw, completed on June 08, 2011. 3. Systemic chemotherapy with carboplatin for AUC of 5 and Alimta 500 mg/M2 every 3 weeks. She is status post 3 cycles.  CURRENT THERAPY: Tarceva 150 mg by mouth daily, therapy beginning 07/24/2012. Status post approximately 55 months of therapy.   INTERVAL HISTORY: [name] 56 y.o. female returns to the clinic today for follow-up visit accompanied by her interpreter.  The patient is feeling fine today with no specific complaints except for recent right ear infection and she was seen at the emergency department and received treatment with antibiotics.  She denied having any fever or chills.  She has no nausea, vomiting, diarrhea or constipation.  She denied having any chest pain, shortness of breath but continues to have dry cough with no hemoptysis.  She is here today for evaluation and repeat blood work.   MEDICAL HISTORY: Past Medical History:  Diagnosis Date  . Drug-induced skin rash 02/27/2017  . Encounter for antineoplastic chemotherapy 01/19/2016  . History of radiation therapy 05/02/11 to 06/08/11   right lung  . Lung cancer ([place])    right lower lobe adenocarcinoma    ALLERGIES:  is allergic to codeine and doxycycline.  MEDICATIONS:  Current Outpatient Medications  Medication Sig Dispense Refill  . acetaminophen (TYLENOL) 500 MG tablet Take 500 mg every 6 (six) hours as needed by mouth.    Marland Kitchen albuterol (PROAIR HFA) 108 (90  Base) MCG/ACT inhaler Inhale 1-2 puffs into the lungs every 6 (six) hours as needed for wheezing or shortness of breath. 1 Inhaler 2  . amoxicillin (AMOXIL) 500 MG capsule Take 2 capsules (1,000 mg total) 2 (two) times daily by mouth. 40 capsule 0  . chlorpheniramine-HYDROcodone (TUSSIONEX) 10-8 MG/5ML SUER Take 5 mLs by mouth 2 (two) times daily. (Patient not taking: Reported on 10/05/2017) 280 mL 0  . clindamycin (CLEOCIN T) 1 % lotion Apply topically 2 (two) times daily. (Patient not taking: Reported on 10/05/2017) 120 mL 1  . clindamycin (CLEOCIN T) 1 % lotion APPLY TO AFFECTED AREA TWICE A DAY 60 mL 0  . diltiazem (CARDIZEM CD) 120 MG 24 hr capsule Take 1 capsule (120 mg total) by mouth daily. 90 capsule 4  . doxycycline (VIBRA-TABS) 100 MG tablet Take 1 tablet (100 mg total) by mouth 2 (two) times daily. (Patient not taking: Reported on 10/05/2017) 14 tablet 0  . hydrocortisone 1 % ointment Apply 1 application topically 2 (two) times daily. (Patient not taking: Reported on 10/05/2017) 30 g 0  . lidocaine-prilocaine (EMLA) cream Apply 1 application topically as needed. (Patient not taking: Reported on 10/05/2017) 30 g 0  . oseltamivir (TAMIFLU) 75 MG capsule Take 1 capsule (75 mg total) every 12 (twelve) hours by mouth. 10 capsule 0  . TARCEVA 150 MG tablet Take 1 tablet ( 150 mg total )  by mouth daily 30 tablet 10   No current facility-administered medications for this visit.     SURGICAL HISTORY:  Past Surgical History:  Procedure Laterality Date  . LUNG LOBECTOMY  04/18/2009   RLL    REVIEW OF SYSTEMS:  A comprehensive review of systems was negative except for: Respiratory: positive for cough   PHYSICAL EXAMINATION: General appearance: alert, cooperative and no distress Head: Normocephalic, without obvious abnormality, atraumatic Neck: no adenopathy, no JVD, supple, symmetrical, trachea midline and thyroid not enlarged, symmetric, no tenderness/mass/nodules Lymph nodes: Cervical,  supraclavicular, and axillary nodes normal. Resp: clear to auscultation bilaterally Back: symmetric, no curvature. ROM normal. No CVA tenderness. Cardio: regular rate and rhythm, S1, S2 normal, no murmur, click, rub or gallop GI: soft, non-tender; bowel sounds normal; no masses,  no organomegaly Extremities: extremities normal, atraumatic, no cyanosis or edema  ECOG PERFORMANCE STATUS: 1 - Symptomatic but completely ambulatory  Blood pressure 129/87, pulse 93, temperature 97.6 F (36.4 C), temperature source Oral, resp. rate 18, height 4\' 9"  (1.448 m), weight 120 lb 9.6 oz (54.7 kg), last menstrual period 02/10/2012, SpO2 99 %.  LABORATORY DATA: Lab Results  Component Value Date   WBC 6.3 10/05/2017   HGB 12.7 10/05/2017   HCT 39.3 10/05/2017   MCV 87.7 10/05/2017   PLT 278 10/05/2017      Chemistry      Component Value Date/Time   NA 141 10/05/2017 2001   NA 141 07/18/2017 0935   K 3.5 10/05/2017 2001   K 4.6 07/18/2017 0935   CL 103 10/05/2017 2001   CL 102 04/30/2013 1002   CO2 28 10/05/2017 2001   CO2 31 (H) 07/18/2017 0935   BUN 8 10/05/2017 2001   BUN 12.6 07/18/2017 0935   CREATININE 0.57 10/05/2017 2001   CREATININE 0.7 07/18/2017 0935      Component Value Date/Time   CALCIUM 8.6 (L) 10/05/2017 2001   CALCIUM 9.8 07/18/2017 0935   ALKPHOS 93 10/05/2017 2001   ALKPHOS 86 07/18/2017 0935   AST 27 10/05/2017 2001   AST 25 07/18/2017 0935   ALT 25 10/05/2017 2001   ALT 20 07/18/2017 0935   BILITOT 0.8 10/05/2017 2001   BILITOT 0.74 07/18/2017 0935       RADIOGRAPHIC STUDIES: Dg Chest 2 View  Result Date: 10/05/2017 CLINICAL DATA:  Pts Grandson states he has influenza A. Pt was exposed. Pt states she has a cough and congestion for 3 days. Pt has no other chest complaints at this time. Pt taking oral chemo due to lung CA PT HX: non smoker, port placement 2013, lung lobectomy RLL 2010 EXAM: CHEST  2 VIEW COMPARISON:  Chest x-ray dated 05/21/2017. FINDINGS:  Stable right pleural effusion, small to moderate in size. Lungs remain otherwise clear. Heart size and mediastinal contours are stable. Left chest wall Port-A-Cath is stable in position with tip at the level of the lower SVC. No acute or suspicious osseous finding. IMPRESSION: No active cardiopulmonary disease. No evidence of pneumonia or pulmonary edema. Stable right pleural effusion, small to moderate in size. Electronically Signed   By: Franki Cabot M.D.   On: 10/05/2017 19:16    ASSESSMENT AND PLAN:  This is a very pleasant 56 years old Asian female with recurrent non-small cell lung cancer, adenocarcinoma status post surgical resection followed by systemic chemotherapy with carboplatin and Alimta followed by disease progression and the patient was started on treatment with Tarceva 150 mg by mouth daily status post 55 months of treatment. She continues to tolerate fairly well with no significant adverse effects. I recommended for the patient to continue her treatment  with Tarceva. I will see her back for follow-up visit in 1 month after repeating CT scan of the chest for restaging of her disease. For the dry cough, I gave the patient refill of Tussionex today. She was advised to call immediately if she has any concerning symptoms in the interval. The patient voices understanding of current disease status and treatment options and is in agreement with the current care plan. All questions were answered. The patient knows to call the clinic with any problems, questions or concerns. We can certainly see the patient much sooner if necessary.  Disclaimer: This note was dictated with voice recognition software. Similar sounding words can inadvertently be transcribed and may not be corrected upon review.

## 2017-10-09 NOTE — Telephone Encounter (Signed)
Scheduled appt per 11/12 los - Gave patient AVS and calender per  [place].

## 2017-10-10 LAB — CULTURE, BLOOD (ROUTINE X 2)
Culture: NO GROWTH
Culture: NO GROWTH
Special Requests: ADEQUATE
Special Requests: ADEQUATE

## 2017-10-25 ENCOUNTER — Other Ambulatory Visit: Payer: Self-pay | Admitting: Medical Oncology

## 2017-10-25 DIAGNOSIS — C3431 Malignant neoplasm of lower lobe, right bronchus or lung: Secondary | ICD-10-CM

## 2017-10-25 DIAGNOSIS — Z95828 Presence of other vascular implants and grafts: Secondary | ICD-10-CM

## 2017-10-25 DIAGNOSIS — C3491 Malignant neoplasm of unspecified part of right bronchus or lung: Secondary | ICD-10-CM

## 2017-10-25 MED ORDER — ERLOTINIB HCL 150 MG PO TABS: ORAL_TABLET | ORAL | 10 refills | Status: DC

## 2017-10-27 ENCOUNTER — Telehealth: Payer: Self-pay | Admitting: *Deleted

## 2017-10-27 NOTE — Telephone Encounter (Signed)
Oncology Nurse Navigator Documentation  Oncology Nurse Navigator Flowsheets 10/27/2017  Navigator Location [facility]  Navigator Encounter Type Telephone/I followed up on [name] schedule. She needs CT scan before she sees Dr. Julien Nordmann next week. I called her daughter and left her a vm message of who to call and schedule with. I also left vm message to call if needed more assistance.  Telephone Outgoing Call  Barriers/Navigation Needs Coordination of Care;Education  Education Other  Interventions Coordination of Care;Education  Coordination of Care Other  Education Method Verbal  Acuity Level 1  Time Spent with Patient 15

## 2017-10-30 ENCOUNTER — Telehealth: Payer: Self-pay | Admitting: *Deleted

## 2017-10-30 ENCOUNTER — Ambulatory Visit (HOSPITAL_COMMUNITY)
Admission: RE | Admit: 2017-10-30 | Discharge: 2017-10-30 | Disposition: A | Discharge status: Home / Self Care | Payer: Medicare Other | Source: Ambulatory Visit | Attending: Internal Medicine | Admitting: Internal Medicine

## 2017-10-30 ENCOUNTER — Other Ambulatory Visit (HOSPITAL_BASED_OUTPATIENT_CLINIC_OR_DEPARTMENT_OTHER): Payer: Medicare Other

## 2017-10-30 DIAGNOSIS — C3490 Malignant neoplasm of unspecified part of unspecified bronchus or lung: Secondary | ICD-10-CM

## 2017-10-30 DIAGNOSIS — C3491 Malignant neoplasm of unspecified part of right bronchus or lung: Secondary | ICD-10-CM | POA: Diagnosis not present

## 2017-10-30 DIAGNOSIS — C3431 Malignant neoplasm of lower lobe, right bronchus or lung: Secondary | ICD-10-CM | POA: Diagnosis present

## 2017-10-30 DIAGNOSIS — J90 Pleural effusion, not elsewhere classified: Secondary | ICD-10-CM | POA: Insufficient documentation

## 2017-10-30 DIAGNOSIS — J439 Emphysema, unspecified: Secondary | ICD-10-CM | POA: Diagnosis not present

## 2017-10-30 DIAGNOSIS — I700 Atherosclerosis of aorta: Secondary | ICD-10-CM | POA: Diagnosis not present

## 2017-10-30 DIAGNOSIS — Z95828 Presence of other vascular implants and grafts: Secondary | ICD-10-CM

## 2017-10-30 DIAGNOSIS — Z5111 Encounter for antineoplastic chemotherapy: Secondary | ICD-10-CM

## 2017-10-30 LAB — COMPREHENSIVE METABOLIC PANEL
ALT: 14 U/L (ref 0–55)
AST: 20 U/L (ref 5–34)
Albumin: 3.9 g/dL (ref 3.5–5.0)
Alkaline Phosphatase: 103 U/L (ref 40–150)
Anion Gap: 9 mEq/L (ref 3–11)
BUN: 14.6 mg/dL (ref 7.0–26.0)
CO2: 29 mEq/L (ref 22–29)
Calcium: 9.2 mg/dL (ref 8.4–10.4)
Chloride: 106 mEq/L (ref 98–109)
Creatinine: 0.7 mg/dL (ref 0.6–1.1)
EGFR: >60 mL/min/{1.73_m2} (ref >60)
Glucose: 89 mg/dl (ref 70–140)
Potassium: 4.3 mEq/L (ref 3.5–5.1)
Sodium: 143 mEq/L (ref 136–145)
Total Bilirubin: 0.79 mg/dL (ref 0.20–1.20)
Total Protein: 7.7 g/dL (ref 6.4–8.3)

## 2017-10-30 LAB — CBC WITH DIFFERENTIAL/PLATELET
BASO%: 0.3 % (ref 0.0–2.0)
Basophils Absolute: 0 10*3/uL (ref 0.0–0.1)
EOS%: 1.5 % (ref 0.0–7.0)
Eosinophils Absolute: 0.1 10*3/uL (ref 0.0–0.5)
HCT: 40.2 % (ref 34.8–46.6)
HGB: 13.2 g/dL (ref 11.6–15.9)
LYMPH%: 18.6 % (ref 14.0–49.7)
MCH: 28.3 pg (ref 25.1–34.0)
MCHC: 32.8 g/dL (ref 31.5–36.0)
MCV: 86.1 fL (ref 79.5–101.0)
MONO#: 0.5 10*3/uL (ref 0.1–0.9)
MONO%: 7.1 % (ref 0.0–14.0)
NEUT#: 5 10*3/uL (ref 1.5–6.5)
NEUT%: 72.5 % (ref 38.4–76.8)
Platelets: 306 10*3/uL (ref 145–400)
RBC: 4.67 10*6/uL (ref 3.70–5.45)
RDW: 14.5 % (ref 11.2–14.5)
WBC: 6.9 10*3/uL (ref 3.9–10.3)
lymph#: 1.3 10*3/uL (ref 0.9–3.3)

## 2017-10-30 MED ORDER — IOPAMIDOL (ISOVUE-300) INJECTION 61%
INTRAVENOUS | Status: AC
  Administered 2017-10-30: 75 mL
  Filled 2017-10-30: qty 75

## 2017-10-30 NOTE — Telephone Encounter (Signed)
Oncology Nurse Navigator Documentation  Oncology Nurse Navigator Flowsheets 10/27/2017  Navigator Location [facility]  Navigator Encounter Type Telephone  Telephone Outgoing Call/I called patient's daughter to follow up on appt for labs and ct.  Stanton Kidney RN has been working on ct while I was talking with the daughter.  Patient is getting her labs and ct today.   Barriers/Navigation Needs Coordination of Care;Education  Education Other  Interventions Coordination of Care;Education  Coordination of Care Other  Education Method Verbal  Acuity Level 1  Time Spent with Patient 15

## 2017-11-02 ENCOUNTER — Telehealth: Payer: Self-pay | Admitting: Internal Medicine

## 2017-11-02 ENCOUNTER — Ambulatory Visit (HOSPITAL_BASED_OUTPATIENT_CLINIC_OR_DEPARTMENT_OTHER): Payer: Medicare Other | Admitting: Internal Medicine

## 2017-11-02 ENCOUNTER — Ambulatory Visit (HOSPITAL_BASED_OUTPATIENT_CLINIC_OR_DEPARTMENT_OTHER): Payer: Medicare Other

## 2017-11-02 ENCOUNTER — Encounter: Payer: Self-pay | Admitting: Internal Medicine

## 2017-11-02 VITALS — BP 107/68 | HR 96 | Temp 98.3°F | Resp 18 | Ht <= 58 in | Wt 121.4 lb

## 2017-11-02 DIAGNOSIS — R197 Diarrhea, unspecified: Secondary | ICD-10-CM | POA: Diagnosis not present

## 2017-11-02 DIAGNOSIS — Z23 Encounter for immunization: Secondary | ICD-10-CM | POA: Diagnosis not present

## 2017-11-02 DIAGNOSIS — R05 Cough: Secondary | ICD-10-CM

## 2017-11-02 DIAGNOSIS — C3491 Malignant neoplasm of unspecified part of right bronchus or lung: Secondary | ICD-10-CM

## 2017-11-02 DIAGNOSIS — C3431 Malignant neoplasm of lower lobe, right bronchus or lung: Secondary | ICD-10-CM | POA: Diagnosis not present

## 2017-11-02 DIAGNOSIS — Z95828 Presence of other vascular implants and grafts: Secondary | ICD-10-CM

## 2017-11-02 DIAGNOSIS — Z5111 Encounter for antineoplastic chemotherapy: Secondary | ICD-10-CM

## 2017-11-02 MED ORDER — HYDROCOD POLST-CPM POLST ER 10-8 MG/5ML PO SUER: 5.0000 mL | Freq: Two times a day (BID) | ORAL | 0 refills | Status: DC

## 2017-11-02 MED ORDER — INFLUENZA VAC SPLIT QUAD 0.5 ML IM SUSY
0.5000 mL | PREFILLED_SYRINGE | Freq: Once | INTRAMUSCULAR | Status: AC
  Administered 2017-11-02: 0.5 mL via INTRAMUSCULAR
  Filled 2017-11-02: qty 0.5

## 2017-11-02 NOTE — Patient Instructions (Signed)

## 2017-11-02 NOTE — Progress Notes (Signed)
[place] [phone]   Fax:(336) [phone]  OFFICE PROGRESS NOTE  Golden Circle, FNP [place] [address] [name] [address]  PRINCIPAL DIAGNOSIS: Local recurrence of non-small cell lung cancer, adenocarcinoma, initially diagnosed as stage IB (T2a N0 M0) in March of 2010.   PRIOR THERAPY:  1. Status post right lower lobectomy under the care of Dr. Cyndia Bent on 04/08/2009. 2. Status post palliative radiotherapy to the right lower lobe recurrent lung mass under the care of Dr. Lisbeth Renshaw, completed on June 08, 2011. 3. Systemic chemotherapy with carboplatin for AUC of 5 and Alimta 500 mg/M2 every 3 weeks. She is status post 3 cycles.  CURRENT THERAPY: Tarceva 150 mg by mouth daily, therapy beginning 07/24/2012. Status post approximately 56 months of therapy.   INTERVAL HISTORY: [name] 56 y.o. female  returns to the clinic today for follow-up visit accompanied by her interpreter.  The patient is feeling fine today with no specific complaints except for the dry cough and occasional episodes of diarrhea every other day.  She denied having any chest pain, shortness of breath or hemoptysis.  She denied having any recent weight loss or night sweats.  She has no fever or chills.  She had flulike symptoms a few weeks ago when she received Tamiflu at the emergency department.  The patient had repeat CT scan of the chest performed recently and she is here for evaluation and discussion of her discuss results.  MEDICAL HISTORY: Past Medical History:  Diagnosis Date  . Drug-induced skin rash 02/27/2017  . Encounter for antineoplastic chemotherapy 01/19/2016  . History of radiation therapy 05/02/11 to 06/08/11   right lung  . Lung cancer ([name])    right lower lobe adenocarcinoma    ALLERGIES:  is allergic to codeine and doxycycline.  MEDICATIONS:  Current Outpatient Medications  Medication Sig Dispense Refill  . acetaminophen (TYLENOL) 500 MG tablet Take 500 mg  every 6 (six) hours as needed by mouth.    Marland Kitchen albuterol (PROAIR HFA) 108 (90 Base) MCG/ACT inhaler Inhale 1-2 puffs into the lungs every 6 (six) hours as needed for wheezing or shortness of breath. 1 Inhaler 2  . chlorpheniramine-HYDROcodone (TUSSIONEX) 10-8 MG/5ML SUER Take 5 mLs 2 (two) times daily by mouth. 280 mL 0  . clindamycin (CLEOCIN T) 1 % lotion APPLY TO AFFECTED AREA TWICE A DAY 60 mL 0  . diltiazem (CARDIZEM CD) 120 MG 24 hr capsule Take 1 capsule (120 mg total) by mouth daily. 90 capsule 4  . doxycycline (VIBRA-TABS) 100 MG tablet Take 1 tablet (100 mg total) by mouth 2 (two) times daily. 14 tablet 0  . erlotinib (TARCEVA) 150 MG tablet Take 1 tablet ( 150 mg total )  by mouth daily 30 tablet 10  . hydrocortisone 1 % ointment Apply 1 application topically 2 (two) times daily. 30 g 0  . lidocaine-prilocaine (EMLA) cream Apply 1 application topically as needed. 30 g 0  . amoxicillin (AMOXIL) 500 MG capsule Take 2 capsules (1,000 mg total) 2 (two) times daily by mouth. 40 capsule 0   No current facility-administered medications for this visit.     SURGICAL HISTORY:  Past Surgical History:  Procedure Laterality Date  . LUNG LOBECTOMY  04/18/2009   RLL  . PORTACATH PLACEMENT  02/29/2012   Procedure: INSERTION PORT-A-CATH;  Surgeon: Nicanor Alcon, MD;  Location: [facility];  Service: Thoracic;  Laterality: Left;  PowerPort 8 F attachable in left  internal jugular.    REVIEW OF SYSTEMS:  Constitutional: negative Eyes: negative Ears, nose, mouth, throat, and face: negative Respiratory: positive for cough Cardiovascular: negative Gastrointestinal: positive for diarrhea Genitourinary:negative Integument/breast: negative Hematologic/lymphatic: negative Musculoskeletal:negative Neurological: negative Behavioral/Psych: negative Endocrine: negative Allergic/Immunologic: negative   PHYSICAL EXAMINATION: General appearance: alert, cooperative and no distress Head: Normocephalic, without  obvious abnormality, atraumatic Neck: no adenopathy, no JVD, supple, symmetrical, trachea midline and thyroid not enlarged, symmetric, no tenderness/mass/nodules Lymph nodes: Cervical, supraclavicular, and axillary nodes normal. Resp: clear to auscultation bilaterally Back: symmetric, no curvature. ROM normal. No CVA tenderness. Cardio: regular rate and rhythm, S1, S2 normal, no murmur, click, rub or gallop GI: soft, non-tender; bowel sounds normal; no masses,  no organomegaly Extremities: extremities normal, atraumatic, no cyanosis or edema Neurologic: Alert and oriented X 3, normal strength and tone. Normal symmetric reflexes. Normal coordination and gait  ECOG PERFORMANCE STATUS: 1 - Symptomatic but completely ambulatory  Blood pressure 107/68, pulse 96, temperature 98.3 F (36.8 C), temperature source Oral, resp. rate 18, height 4\' 9"  (1.448 m), weight 121 lb 6.4 oz (55.1 kg), last menstrual period 02/10/2012, SpO2 100 %.  LABORATORY DATA: Lab Results  Component Value Date   WBC 6.9 10/30/2017   HGB 13.2 10/30/2017   HCT 40.2 10/30/2017   MCV 86.1 10/30/2017   PLT 306 10/30/2017      Chemistry      Component Value Date/Time   NA 143 10/30/2017 1031   K 4.3 10/30/2017 1031   CL 103 10/05/2017 2001   CL 102 04/30/2013 1002   CO2 29 10/30/2017 1031   BUN 14.6 10/30/2017 1031   CREATININE 0.7 10/30/2017 1031      Component Value Date/Time   CALCIUM 9.2 10/30/2017 1031   ALKPHOS 103 10/30/2017 1031   AST 20 10/30/2017 1031   ALT 14 10/30/2017 1031   BILITOT 0.79 10/30/2017 1031       RADIOGRAPHIC STUDIES: Dg Chest 2 View  Result Date: 10/05/2017 CLINICAL DATA:  Pts Grandson states he has influenza A. Pt was exposed. Pt states she has a cough and congestion for 3 days. Pt has no other chest complaints at this time. Pt taking oral chemo due to lung CA PT HX: non smoker, port placement 2013, lung lobectomy RLL 2010 EXAM: CHEST  2 VIEW COMPARISON:  Chest x-ray dated  05/21/2017. FINDINGS: Stable right pleural effusion, small to moderate in size. Lungs remain otherwise clear. Heart size and mediastinal contours are stable. Left chest wall Port-A-Cath is stable in position with tip at the level of the lower SVC. No acute or suspicious osseous finding. IMPRESSION: No active cardiopulmonary disease. No evidence of pneumonia or pulmonary edema. Stable right pleural effusion, small to moderate in size. Electronically Signed   By: Franki Cabot M.D.   On: 10/05/2017 19:16   Ct Chest W Contrast  Result Date: 10/30/2017 CLINICAL DATA:  Followup right lung cancer. EXAM: CT CHEST WITH CONTRAST TECHNIQUE: Multidetector CT imaging of the chest was performed during intravenous contrast administration. CONTRAST:  29mL ISOVUE-300 IOPAMIDOL (ISOVUE-300) INJECTION 61% COMPARISON:  08/17/2017 FINDINGS: Cardiovascular: There is mild cardiac enlargement. No pericardial effusion. Aortic atherosclerosis. Mediastinum/Nodes: The trachea appears patent and is midline. Normal appearance of the esophagus. No enlarged mediastinal or hilar lymph nodes. Lungs/Pleura: Postsurgical and volume loss from the right lung compatible with partial right lower lobectomy. Stable loculated right pleural effusion and thickening. Masslike architectural distortion within the central right lung is again noted representing radiation change. Central enhancing component  referenced on the previous examination is similar to the previous exam measuring 3.4 by 2.8 by 1.7 cm (volume = 8.5 cm^3). On the previous exam this area measured 3.1 x 3.6 by 1.3 cm (volume = 7.6 cm^3). Focal area of architectural distortion within the medial right apex is unchanged, image 31 of series 7. Stable appearance of right upper lung 3 mm nodule, image 48 of series 7. Upper Abdomen: No acute abnormality. Musculoskeletal: Postsurgical changes involving the left posterior ribs noted. No aggressive lytic or sclerotic bone lesions. IMPRESSION: 1. No  significant change in post treatment changes throughout the right lung. 2. Aortic Atherosclerosis (ICD10-I70.0) and Emphysema (ICD10-J43.9). 3. Stable loculated right pleural effusion and thickening. Electronically Signed   By: Kerby Moors M.D.   On: 10/30/2017 15:49    ASSESSMENT AND PLAN:  This is a very pleasant 56 years old Asian female with recurrent non-small cell lung cancer, adenocarcinoma status post surgical resection followed by systemic chemotherapy with carboplatin and Alimta followed by disease progression and the patient was started on treatment with Tarceva 150 mg by mouth daily status post 56 months of treatment. The patient has no complaints today except for the persistent dry cough and few episodes of diarrhea.  She is otherwise tolerating her treatment well. Her recent CT scan of the chest showed no evidence for disease progression. I personally and independently reviewed the scan images and discussed the results with the patient today. I recommended for her to continue on Tarceva 150 mg p.o. Daily. I would see her back for follow-up visit in 6 weeks for evaluation and repeat blood work. For the persistent dry cough, I gave the patient refill of Tussionex today. For the diarrhea I recommended for the patient to use Imodium on as-needed basis. The patient was advised to call immediately if she has any concerning symptoms in the interval.  All questions were answered. The patient knows to call the clinic with any problems, questions or concerns. We can certainly see the patient much sooner if necessary.  Disclaimer: This note was dictated with voice recognition software. Similar sounding words can inadvertently be transcribed and may not be corrected upon review.

## 2017-11-02 NOTE — Telephone Encounter (Signed)
Scheduled appt per 12/6 los - Gave patient AVS and calender per los 

## 2017-11-17 ENCOUNTER — Other Ambulatory Visit: Payer: Self-pay | Admitting: Medical Oncology

## 2017-11-17 DIAGNOSIS — C3431 Malignant neoplasm of lower lobe, right bronchus or lung: Secondary | ICD-10-CM

## 2017-11-17 DIAGNOSIS — Z95828 Presence of other vascular implants and grafts: Secondary | ICD-10-CM

## 2017-11-17 DIAGNOSIS — C3491 Malignant neoplasm of unspecified part of right bronchus or lung: Secondary | ICD-10-CM

## 2017-11-17 MED ORDER — ERLOTINIB HCL 150 MG PO TABS: ORAL_TABLET | ORAL | 10 refills | Status: DC

## 2017-12-11 ENCOUNTER — Inpatient Hospital Stay: Payer: Medicare Other

## 2017-12-11 ENCOUNTER — Telehealth: Payer: Self-pay | Admitting: Internal Medicine

## 2017-12-11 ENCOUNTER — Encounter: Payer: Self-pay | Admitting: Internal Medicine

## 2017-12-11 ENCOUNTER — Inpatient Hospital Stay: Payer: Medicare Other | Attending: Internal Medicine | Admitting: Internal Medicine

## 2017-12-11 ENCOUNTER — Encounter: Payer: Self-pay | Admitting: *Deleted

## 2017-12-11 VITALS — BP 130/87 | HR 95 | Temp 97.8°F | Resp 18 | Ht <= 58 in | Wt 123.1 lb

## 2017-12-11 DIAGNOSIS — C3431 Malignant neoplasm of lower lobe, right bronchus or lung: Secondary | ICD-10-CM | POA: Diagnosis not present

## 2017-12-11 DIAGNOSIS — R21 Rash and other nonspecific skin eruption: Secondary | ICD-10-CM

## 2017-12-11 DIAGNOSIS — Z923 Personal history of irradiation: Secondary | ICD-10-CM | POA: Insufficient documentation

## 2017-12-11 LAB — COMPREHENSIVE METABOLIC PANEL
ALT: 23 U/L (ref 0–55)
AST: 23 U/L (ref 5–34)
Albumin: 3.9 g/dL (ref 3.5–5.0)
Alkaline Phosphatase: 86 U/L (ref 40–150)
Anion gap: 7 (ref 3–11)
BUN: 9 mg/dL (ref 7–26)
CO2: 31 mmol/L — ABNORMAL HIGH (ref 22–29)
Calcium: 9.2 mg/dL (ref 8.4–10.4)
Chloride: 105 mmol/L (ref 98–109)
Creatinine, Ser: 0.66 mg/dL (ref 0.60–1.10)
GFR calc Af Amer: >60 mL/min (ref >60)
GFR calc non Af Amer: >60 mL/min (ref >60)
Glucose, Bld: 81 mg/dL (ref 70–140)
Potassium: 4.2 mmol/L (ref 3.3–4.7)
Sodium: 143 mmol/L (ref 136–145)
Total Bilirubin: 1 mg/dL (ref 0.2–1.2)
Total Protein: 7.6 g/dL (ref 6.4–8.3)

## 2017-12-11 LAB — CBC WITH DIFFERENTIAL/PLATELET
Basophils Absolute: 0 10*3/uL (ref 0.0–0.1)
Basophils Relative: 0 %
Eosinophils Absolute: 0.1 10*3/uL (ref 0.0–0.5)
Eosinophils Relative: 1 %
HCT: 40.9 % (ref 34.8–46.6)
Hemoglobin: 13.1 g/dL (ref 11.6–15.9)
Lymphocytes Relative: 25 %
Lymphs Abs: 1.9 10*3/uL (ref 0.9–3.3)
MCH: 28.6 pg (ref 25.1–34.0)
MCHC: 32 g/dL (ref 31.5–36.0)
MCV: 89.3 fL (ref 79.5–101.0)
Monocytes Absolute: 0.6 10*3/uL (ref 0.1–0.9)
Monocytes Relative: 7 %
Neutro Abs: 5 10*3/uL (ref 1.5–6.5)
Neutrophils Relative %: 67 %
Platelets: 310 10*3/uL (ref 145–400)
RBC: 4.58 MIL/uL (ref 3.70–5.45)
RDW: 14.8 % (ref 11.2–16.1)
WBC: 7.6 10*3/uL (ref 3.9–10.3)

## 2017-12-11 MED FILL — HYDROCODONE-CHLORPHENIRAM S: 10-8 | 28 days supply | Qty: 280 | Fill #0

## 2017-12-11 NOTE — Telephone Encounter (Signed)
Gave avs and calendar for february °

## 2017-12-11 NOTE — Progress Notes (Signed)
Oncology Nurse Navigator Documentation  Oncology Nurse Navigator Flowsheets 12/11/2017  Navigator Location [facility]-  Navigator Encounter Type Clinic/MDC/I spoke with patient and the interpretor today.  She is having complaints of dry nose.  Per Dr. Julien Nordmann, I updated her on nasal saline spray to help. I also updated her on follow up in 6 weeks.   Patient Visit Type MedOnc  Treatment Phase Treatment  Barriers/Navigation Needs Education  Education Other  Interventions Education  Education Method Verbal  Acuity Level 2  Time Spent with Patient 15

## 2017-12-11 NOTE — Progress Notes (Signed)
[facility] Telephone:(336) [phone]   Fax:(336) [phone]  OFFICE PROGRESS NOTE  Golden Circle, FNP [place] [address] [place] [address]  PRINCIPAL DIAGNOSIS: Local recurrence of non-small cell lung cancer, adenocarcinoma, initially diagnosed as stage IB (T2a N0 M0) in March of 2010.   PRIOR THERAPY:  1. Status post right lower lobectomy under the care of Dr. Cyndia Bent on 04/08/2009. 2. Status post palliative radiotherapy to the right lower lobe recurrent lung mass under the care of Dr. Lisbeth Renshaw, completed on June 08, 2011. 3. Systemic chemotherapy with carboplatin for AUC of 5 and Alimta 500 mg/M2 every 3 weeks. She is status post 3 cycles.  CURRENT THERAPY: Tarceva 150 mg by mouth daily, therapy beginning 07/24/2012. Status post approximately 57 months of therapy.   INTERVAL HISTORY: [name] 57 y.o. female  returns to the clinic today for follow-up visit accompanied by her [place] interpreter.  The patient is feeling fine today with no specific complaints except for some sore spots in the nose and at the eyebrows.  She denied having any current chest pain, shortness of breath, cough or hemoptysis.  She denied having any nausea, vomiting, diarrhea or constipation.  The patient denied having any recent weight loss or night sweats.  She is here today for evaluation with repeat blood work.  MEDICAL HISTORY: Past Medical History:  Diagnosis Date  . Drug-induced skin rash 02/27/2017  . Encounter for antineoplastic chemotherapy 01/19/2016  . History of radiation therapy 05/02/11 to 06/08/11   right lung  . Lung cancer ([place])    right lower lobe adenocarcinoma    ALLERGIES:  is allergic to codeine and doxycycline.  MEDICATIONS:  Current Outpatient Medications  Medication Sig Dispense Refill  . acetaminophen (TYLENOL) 500 MG tablet Take 500 mg every 6 (six) hours as needed by mouth.    Marland Kitchen albuterol (PROAIR HFA) 108 (90 Base) MCG/ACT inhaler Inhale 1-2 puffs  into the lungs every 6 (six) hours as needed for wheezing or shortness of breath. 1 Inhaler 2  . chlorpheniramine-HYDROcodone (TUSSIONEX) 10-8 MG/5ML SUER Take 5 mLs by mouth 2 (two) times daily. 280 mL 0  . clindamycin (CLEOCIN T) 1 % lotion APPLY TO AFFECTED AREA TWICE A DAY 60 mL 0  . diltiazem (CARDIZEM CD) 120 MG 24 hr capsule Take 1 capsule (120 mg total) by mouth daily. 90 capsule 4  . erlotinib (TARCEVA) 150 MG tablet Take 1 tablet ( 150 mg total )  by mouth daily 30 tablet 10  . hydrocortisone 1 % ointment Apply 1 application topically 2 (two) times daily. 30 g 0  . lidocaine-prilocaine (EMLA) cream Apply 1 application topically as needed. 30 g 0   No current facility-administered medications for this visit.     SURGICAL HISTORY:  Past Surgical History:  Procedure Laterality Date  . LUNG LOBECTOMY  04/18/2009   RLL  . PORTACATH PLACEMENT  02/29/2012   Procedure: INSERTION PORT-A-CATH;  Surgeon: Nicanor Alcon, MD;  Location: [facility];  Service: Thoracic;  Laterality: Left;  PowerPort 8 F attachable in left internal jugular.    REVIEW OF SYSTEMS:  A comprehensive review of systems was negative except for: Integument/breast: positive for rash   PHYSICAL EXAMINATION: General appearance: alert, cooperative and no distress Head: Normocephalic, without obvious abnormality, atraumatic Neck: no adenopathy, no JVD, supple, symmetrical, trachea midline and thyroid not enlarged, symmetric, no tenderness/mass/nodules Lymph nodes: Cervical, supraclavicular, and axillary nodes normal. Resp: clear to auscultation bilaterally  Back: symmetric, no curvature. ROM normal. No CVA tenderness. Cardio: regular rate and rhythm, S1, S2 normal, no murmur, click, rub or gallop GI: soft, non-tender; bowel sounds normal; no masses,  no organomegaly Extremities: extremities normal, atraumatic, no cyanosis or edema  ECOG PERFORMANCE STATUS: 1 - Symptomatic but completely ambulatory  Blood pressure 130/87,  pulse 95, temperature 97.8 F (36.6 C), temperature source Oral, resp. rate 18, height 4\' 9"  (1.448 m), weight 123 lb 1.6 oz (55.8 kg), last menstrual period 02/10/2012, SpO2 99 %.  LABORATORY DATA: Lab Results  Component Value Date   WBC 7.6 12/11/2017   HGB 13.1 12/11/2017   HCT 40.9 12/11/2017   MCV 89.3 12/11/2017   PLT 310 12/11/2017      Chemistry      Component Value Date/Time   NA 143 12/11/2017 0950   NA 143 10/30/2017 1031   K 4.2 12/11/2017 0950   K 4.3 10/30/2017 1031   CL 105 12/11/2017 0950   CL 102 04/30/2013 1002   CO2 31 (H) 12/11/2017 0950   CO2 29 10/30/2017 1031   BUN 9 12/11/2017 0950   BUN 14.6 10/30/2017 1031   CREATININE 0.66 12/11/2017 0950   CREATININE 0.7 10/30/2017 1031      Component Value Date/Time   CALCIUM 9.2 12/11/2017 0950   CALCIUM 9.2 10/30/2017 1031   ALKPHOS 86 12/11/2017 0950   ALKPHOS 103 10/30/2017 1031   AST 23 12/11/2017 0950   AST 20 10/30/2017 1031   ALT 23 12/11/2017 0950   ALT 14 10/30/2017 1031   BILITOT 1.0 12/11/2017 0950   BILITOT 0.79 10/30/2017 1031       RADIOGRAPHIC STUDIES: No results found.  ASSESSMENT AND PLAN:  This is a very pleasant 57 years old Asian female with recurrent non-small cell lung cancer, adenocarcinoma status post surgical resection followed by systemic chemotherapy with carboplatin and Alimta followed by disease progression and the patient was started on treatment with Tarceva 150 mg by mouth daily status post 57 months of treatment. The patient continues to tolerate this treatment well with no concerning complaints except for mild skin rash. I recommended for her to proceed with her treatment with Tarceva with the same dose. For the dryness of the nose, she will use saline drops.  She was also advised to use clindamycin lotion for the other areas of his skin rash. She will come back for follow-up visit in 6 weeks for evaluation with repeat blood work. The patient was advised to call  immediately if she has any concerning symptoms in the interval. All questions were answered. The patient knows to call the clinic with any problems, questions or concerns. We can certainly see the patient much sooner if necessary.  Disclaimer: This note was dictated with voice recognition software. Similar sounding words can inadvertently be transcribed and may not be corrected upon review.

## 2018-01-02 ENCOUNTER — Telehealth: Payer: Self-pay | Admitting: Medical Oncology

## 2018-01-02 ENCOUNTER — Other Ambulatory Visit: Payer: Self-pay | Admitting: Medical Oncology

## 2018-01-02 DIAGNOSIS — L270 Generalized skin eruption due to drugs and medicaments taken internally: Secondary | ICD-10-CM

## 2018-01-02 MED ORDER — DOXYCYCLINE HYCLATE 100 MG PO TABS: 100.0000 mg | ORAL_TABLET | Freq: Two times a day (BID) | ORAL | 0 refills | Status: DC

## 2018-01-02 MED FILL — DOXYCYCLINE HYCLATE 100 MG: 100 | 5 days supply | Qty: 10 | Fill #0

## 2018-01-02 NOTE — Telephone Encounter (Signed)
Rash on face around nose- Per [name] doxycycline sent to [facility]. Urinary frequency- Per Julien Nordmann , pt needs to see PCP. Dtr notified of all this.

## 2018-01-22 ENCOUNTER — Other Ambulatory Visit: Payer: Self-pay

## 2018-01-22 ENCOUNTER — Ambulatory Visit: Payer: Self-pay | Admitting: Internal Medicine

## 2018-01-29 ENCOUNTER — Inpatient Hospital Stay (HOSPITAL_BASED_OUTPATIENT_CLINIC_OR_DEPARTMENT_OTHER): Payer: Medicare Other | Admitting: Internal Medicine

## 2018-01-29 ENCOUNTER — Telehealth: Payer: Self-pay | Admitting: Internal Medicine

## 2018-01-29 ENCOUNTER — Encounter: Payer: Self-pay | Admitting: Internal Medicine

## 2018-01-29 ENCOUNTER — Inpatient Hospital Stay: Payer: Medicare Other | Attending: Internal Medicine

## 2018-01-29 DIAGNOSIS — C3491 Malignant neoplasm of unspecified part of right bronchus or lung: Secondary | ICD-10-CM

## 2018-01-29 DIAGNOSIS — C3431 Malignant neoplasm of lower lobe, right bronchus or lung: Secondary | ICD-10-CM

## 2018-01-29 DIAGNOSIS — Z9221 Personal history of antineoplastic chemotherapy: Secondary | ICD-10-CM

## 2018-01-29 DIAGNOSIS — C3490 Malignant neoplasm of unspecified part of unspecified bronchus or lung: Secondary | ICD-10-CM

## 2018-01-29 DIAGNOSIS — Z5111 Encounter for antineoplastic chemotherapy: Secondary | ICD-10-CM

## 2018-01-29 DIAGNOSIS — R21 Rash and other nonspecific skin eruption: Secondary | ICD-10-CM

## 2018-01-29 DIAGNOSIS — Z923 Personal history of irradiation: Secondary | ICD-10-CM | POA: Insufficient documentation

## 2018-01-29 DIAGNOSIS — Z95828 Presence of other vascular implants and grafts: Secondary | ICD-10-CM

## 2018-01-29 LAB — CMP (CANCER CENTER ONLY)
ALT: 18 U/L (ref 0–55)
AST: 23 U/L (ref 5–34)
Albumin: 3.8 g/dL (ref 3.5–5.0)
Alkaline Phosphatase: 83 U/L (ref 40–150)
Anion gap: 8 (ref 3–11)
BUN: 12 mg/dL (ref 7–26)
CO2: 30 mmol/L — ABNORMAL HIGH (ref 22–29)
Calcium: 9.5 mg/dL (ref 8.4–10.4)
Chloride: 105 mmol/L (ref 98–109)
Creatinine: 0.66 mg/dL (ref 0.60–1.10)
GFR, Est AFR Am: >60 mL/min (ref >60)
GFR, Estimated: >60 mL/min (ref >60)
Glucose, Bld: 90 mg/dL (ref 70–140)
Potassium: 4.4 mmol/L (ref 3.5–5.1)
Sodium: 143 mmol/L (ref 136–145)
Total Bilirubin: 0.8 mg/dL (ref 0.2–1.2)
Total Protein: 7.5 g/dL (ref 6.4–8.3)

## 2018-01-29 LAB — CBC WITH DIFFERENTIAL (CANCER CENTER ONLY)
Basophils Absolute: 0 10*3/uL (ref 0.0–0.1)
Basophils Relative: 0 %
Eosinophils Absolute: 0.1 10*3/uL (ref 0.0–0.5)
Eosinophils Relative: 1 %
HCT: 39.7 % (ref 34.8–46.6)
Hemoglobin: 12.8 g/dL (ref 11.6–15.9)
Lymphocytes Relative: 22 %
Lymphs Abs: 1.4 10*3/uL (ref 0.9–3.3)
MCH: 28.7 pg (ref 25.1–34.0)
MCHC: 32.2 g/dL (ref 31.5–36.0)
MCV: 89 fL (ref 79.5–101.0)
Monocytes Absolute: 0.4 10*3/uL (ref 0.1–0.9)
Monocytes Relative: 7 %
Neutro Abs: 4.4 10*3/uL (ref 1.5–6.5)
Neutrophils Relative %: 70 %
Platelet Count: 289 10*3/uL (ref 145–400)
RBC: 4.46 MIL/uL (ref 3.70–5.45)
RDW: 14.3 % (ref 11.2–14.5)
WBC Count: 6.3 10*3/uL (ref 3.9–10.3)

## 2018-01-29 MED ORDER — CLINDAMYCIN PHOSPHATE 1 % EX LOTN
TOPICAL_LOTION | CUTANEOUS | 0 refills | Status: DC
Start: 2018-01-29 — End: 2018-11-12

## 2018-01-29 MED ORDER — HYDROCOD POLST-CPM POLST ER 10-8 MG/5ML PO SUER: 5.0000 mL | Freq: Two times a day (BID) | ORAL | 0 refills | Status: DC

## 2018-01-29 MED FILL — CLINDAMYCIN PHOSP 1% LOTION: 1 | 30 days supply | Qty: 60 | Fill #0

## 2018-01-29 NOTE — Progress Notes (Signed)
[facility] Telephone:(336) [phone]   Fax:(336) [phone]  OFFICE PROGRESS NOTE  Golden Circle, FNP [place] [address] [place] [address]  PRINCIPAL DIAGNOSIS: Local recurrence of non-small cell lung cancer, adenocarcinoma, initially diagnosed as stage IB (T2a N0 M0) in March of 2010.   PRIOR THERAPY:  1. Status post right lower lobectomy under the care of Dr. Cyndia Bent on 04/08/2009. 2. Status post palliative radiotherapy to the right lower lobe recurrent lung mass under the care of Dr. Lisbeth Renshaw, completed on June 08, 2011. 3. Systemic chemotherapy with carboplatin for AUC of 5 and Alimta 500 mg/M2 every 3 weeks. She is status post 3 cycles.  CURRENT THERAPY: Tarceva 150 mg by mouth daily, therapy beginning 07/24/2012. Status post approximately 59 months of therapy.   INTERVAL HISTORY: [name] 57 y.o. female returns to the clinic today for follow-up visit accompanied by her daughter.  The patient is feeling fine today with no specific complaints except for some inflammation of the eyebrows and she is requesting refill of the clindamycin lotion.  She also had occasional episodes of diarrhea.  She denied having any recent chest pain but continues to have dry cough with no shortness breath or hemoptysis.  She has no recent weight loss or night sweats.  The patient is here today for evaluation and repeat blood work.  MEDICAL HISTORY: Past Medical History:  Diagnosis Date  . Drug-induced skin rash 02/27/2017  . Encounter for antineoplastic chemotherapy 01/19/2016  . History of radiation therapy 05/02/11 to 06/08/11   right lung  . Lung cancer ([facility])    right lower lobe adenocarcinoma    ALLERGIES:  is allergic to codeine and doxycycline.  MEDICATIONS:  Current Outpatient Medications  Medication Sig Dispense Refill  . acetaminophen (TYLENOL) 500 MG tablet Take 500 mg every 6 (six) hours as needed by mouth.    Marland Kitchen albuterol (PROAIR HFA) 108 (90 Base) MCG/ACT  inhaler Inhale 1-2 puffs into the lungs every 6 (six) hours as needed for wheezing or shortness of breath. 1 Inhaler 2  . chlorpheniramine-HYDROcodone (TUSSIONEX) 10-8 MG/5ML SUER Take 5 mLs by mouth 2 (two) times daily. 280 mL 0  . clindamycin (CLEOCIN T) 1 % lotion APPLY TO AFFECTED AREA TWICE A DAY 60 mL 0  . diltiazem (CARDIZEM CD) 120 MG 24 hr capsule Take 1 capsule (120 mg total) by mouth daily. 90 capsule 4  . doxycycline (VIBRA-TABS) 100 MG tablet Take 1 tablet (100 mg total) by mouth 2 (two) times daily. 10 tablet 0  . erlotinib (TARCEVA) 150 MG tablet Take 1 tablet ( 150 mg total )  by mouth daily 30 tablet 10  . hydrocortisone 1 % ointment Apply 1 application topically 2 (two) times daily. 30 g 0  . lidocaine-prilocaine (EMLA) cream Apply 1 application topically as needed. 30 g 0   No current facility-administered medications for this visit.     SURGICAL HISTORY:  Past Surgical History:  Procedure Laterality Date  . LUNG LOBECTOMY  04/18/2009   RLL  . PORTACATH PLACEMENT  02/29/2012   Procedure: INSERTION PORT-A-CATH;  Surgeon: Nicanor Alcon, MD;  Location: [facility];  Service: Thoracic;  Laterality: Left;  PowerPort 8 F attachable in left internal jugular.    REVIEW OF SYSTEMS:  A comprehensive review of systems was negative except for: Integument/breast: positive for rash   PHYSICAL EXAMINATION: General appearance: alert, cooperative and no distress Head: Normocephalic, without obvious abnormality, atraumatic Neck: no  adenopathy, no JVD, supple, symmetrical, trachea midline and thyroid not enlarged, symmetric, no tenderness/mass/nodules Lymph nodes: Cervical, supraclavicular, and axillary nodes normal. Resp: clear to auscultation bilaterally Back: symmetric, no curvature. ROM normal. No CVA tenderness. Cardio: regular rate and rhythm, S1, S2 normal, no murmur, click, rub or gallop GI: soft, non-tender; bowel sounds normal; no masses,  no organomegaly Extremities: extremities  normal, atraumatic, no cyanosis or edema  ECOG PERFORMANCE STATUS: 1 - Symptomatic but completely ambulatory  Blood pressure (!) 130/91, pulse 97, temperature 98.3 F (36.8 C), temperature source Oral, resp. rate 18, height 4\' 9"  (1.448 m), weight 122 lb 9.6 oz (55.6 kg), last menstrual period 02/10/2012, SpO2 98 %.  LABORATORY DATA: Lab Results  Component Value Date   WBC 6.3 01/29/2018   HGB 13.1 12/11/2017   HCT 39.7 01/29/2018   MCV 89.0 01/29/2018   PLT 289 01/29/2018      Chemistry      Component Value Date/Time   NA 143 12/11/2017 0950   NA 143 10/30/2017 1031   K 4.2 12/11/2017 0950   K 4.3 10/30/2017 1031   CL 105 12/11/2017 0950   CL 102 04/30/2013 1002   CO2 31 (H) 12/11/2017 0950   CO2 29 10/30/2017 1031   BUN 9 12/11/2017 0950   BUN 14.6 10/30/2017 1031   CREATININE 0.66 12/11/2017 0950   CREATININE 0.7 10/30/2017 1031      Component Value Date/Time   CALCIUM 9.2 12/11/2017 0950   CALCIUM 9.2 10/30/2017 1031   ALKPHOS 86 12/11/2017 0950   ALKPHOS 103 10/30/2017 1031   AST 23 12/11/2017 0950   AST 20 10/30/2017 1031   ALT 23 12/11/2017 0950   ALT 14 10/30/2017 1031   BILITOT 1.0 12/11/2017 0950   BILITOT 0.79 10/30/2017 1031       RADIOGRAPHIC STUDIES: No results found.  ASSESSMENT AND PLAN:  This is a very pleasant 57 years old Asian female with recurrent non-small cell lung cancer, adenocarcinoma status post surgical resection followed by systemic chemotherapy with carboplatin and Alimta followed by disease progression and the patient was started on treatment with Tarceva 150 mg by mouth daily status post 59 months of treatment. The patient continues to tolerate this treatment well with no concerning complaints except for mild skin rash. The patient will continue her current treatment with Tarceva with the same dose. For the skin rash, I gave her a refill of clindamycin. For the cough she was given refill of Tussionex. I will see her back for  follow-up visit in 6 weeks for evaluation after repeating CT scan of the chest for restaging of her disease. She was advised to call immediately if she has any concerning symptoms in the interval. All questions were answered. The patient knows to call the clinic with any problems, questions or concerns. We can certainly see the patient much sooner if necessary.  Disclaimer: This note was dictated with voice recognition software. Similar sounding words can inadvertently be transcribed and may not be corrected upon review.

## 2018-01-29 NOTE — Telephone Encounter (Signed)
Appointments scheduled/ AVS/Calendar/ phone number for radiology scheduling was also given to patient per 3/4 los

## 2018-01-30 MED FILL — HYDROCODONE-CHLORPHENIRAM S: 10-8 | 28 days supply | Qty: 280 | Fill #0

## 2018-03-09 ENCOUNTER — Ambulatory Visit (HOSPITAL_COMMUNITY)
Admission: RE | Admit: 2018-03-09 | Discharge: 2018-03-09 | Disposition: A | Discharge status: Home / Self Care | Payer: Medicare Other | Source: Ambulatory Visit | Attending: Internal Medicine | Admitting: Internal Medicine

## 2018-03-09 ENCOUNTER — Inpatient Hospital Stay: Payer: Medicare Other | Attending: Internal Medicine

## 2018-03-09 DIAGNOSIS — R197 Diarrhea, unspecified: Secondary | ICD-10-CM | POA: Diagnosis not present

## 2018-03-09 DIAGNOSIS — C3490 Malignant neoplasm of unspecified part of unspecified bronchus or lung: Secondary | ICD-10-CM | POA: Diagnosis not present

## 2018-03-09 DIAGNOSIS — R911 Solitary pulmonary nodule: Secondary | ICD-10-CM | POA: Insufficient documentation

## 2018-03-09 DIAGNOSIS — J90 Pleural effusion, not elsewhere classified: Secondary | ICD-10-CM | POA: Diagnosis not present

## 2018-03-09 DIAGNOSIS — H6690 Otitis media, unspecified, unspecified ear: Secondary | ICD-10-CM | POA: Diagnosis not present

## 2018-03-09 DIAGNOSIS — C3431 Malignant neoplasm of lower lobe, right bronchus or lung: Secondary | ICD-10-CM | POA: Diagnosis not present

## 2018-03-09 DIAGNOSIS — R05 Cough: Secondary | ICD-10-CM | POA: Diagnosis not present

## 2018-03-09 DIAGNOSIS — I700 Atherosclerosis of aorta: Secondary | ICD-10-CM | POA: Insufficient documentation

## 2018-03-09 DIAGNOSIS — R21 Rash and other nonspecific skin eruption: Secondary | ICD-10-CM | POA: Diagnosis not present

## 2018-03-09 DIAGNOSIS — C3491 Malignant neoplasm of unspecified part of right bronchus or lung: Secondary | ICD-10-CM | POA: Diagnosis not present

## 2018-03-09 DIAGNOSIS — I2510 Atherosclerotic heart disease of native coronary artery without angina pectoris: Secondary | ICD-10-CM | POA: Diagnosis not present

## 2018-03-09 LAB — CBC WITH DIFFERENTIAL (CANCER CENTER ONLY)
Basophils Absolute: 0 10*3/uL (ref 0.0–0.1)
Basophils Relative: 0 %
Eosinophils Absolute: 0.1 10*3/uL (ref 0.0–0.5)
Eosinophils Relative: 1 %
HCT: 37 % (ref 34.8–46.6)
Hemoglobin: 12 g/dL (ref 11.6–15.9)
Lymphocytes Relative: 10 %
Lymphs Abs: 1.3 10*3/uL (ref 0.9–3.3)
MCH: 28.8 pg (ref 25.1–34.0)
MCHC: 32.4 g/dL (ref 31.5–36.0)
MCV: 88.9 fL (ref 79.5–101.0)
Monocytes Absolute: 0.9 10*3/uL (ref 0.1–0.9)
Monocytes Relative: 7 %
Neutro Abs: 10.5 10*3/uL — ABNORMAL HIGH (ref 1.5–6.5)
Neutrophils Relative %: 82 %
Platelet Count: 300 10*3/uL (ref 145–400)
RBC: 4.16 MIL/uL (ref 3.70–5.45)
RDW: 13.7 % (ref 11.2–14.5)
WBC Count: 12.9 10*3/uL — ABNORMAL HIGH (ref 3.9–10.3)

## 2018-03-09 LAB — CMP (CANCER CENTER ONLY)
ALT: 39 U/L (ref 0–55)
AST: 36 U/L — ABNORMAL HIGH (ref 5–34)
Albumin: 3.5 g/dL (ref 3.5–5.0)
Alkaline Phosphatase: 117 U/L (ref 40–150)
Anion gap: 9 (ref 3–11)
BUN: 8 mg/dL (ref 7–26)
CO2: 29 mmol/L (ref 22–29)
Calcium: 9.2 mg/dL (ref 8.4–10.4)
Chloride: 103 mmol/L (ref 98–109)
Creatinine: 0.66 mg/dL (ref 0.60–1.10)
GFR, Est AFR Am: >60 mL/min (ref >60)
GFR, Estimated: >60 mL/min (ref >60)
Glucose, Bld: 127 mg/dL (ref 70–140)
Potassium: 3.2 mmol/L — ABNORMAL LOW (ref 3.5–5.1)
Sodium: 141 mmol/L (ref 136–145)
Total Bilirubin: 1 mg/dL (ref 0.2–1.2)
Total Protein: 7.7 g/dL (ref 6.4–8.3)

## 2018-03-09 MED ORDER — IOHEXOL 300 MG/ML  SOLN
75.0000 mL | Freq: Once | INTRAMUSCULAR | Status: AC | PRN
  Administered 2018-03-09: 75 mL via INTRAVENOUS

## 2018-03-12 ENCOUNTER — Ambulatory Visit: Payer: Self-pay | Admitting: Internal Medicine

## 2018-03-13 ENCOUNTER — Telehealth: Payer: Self-pay | Admitting: Oncology

## 2018-03-13 ENCOUNTER — Inpatient Hospital Stay (HOSPITAL_BASED_OUTPATIENT_CLINIC_OR_DEPARTMENT_OTHER): Payer: Medicare Other | Admitting: Oncology

## 2018-03-13 ENCOUNTER — Encounter: Payer: Self-pay | Admitting: Oncology

## 2018-03-13 ENCOUNTER — Ambulatory Visit: Payer: Self-pay | Admitting: Internal Medicine

## 2018-03-13 VITALS — BP 113/73 | HR 99 | Temp 98.2°F | Resp 17 | Ht <= 58 in | Wt 121.1 lb

## 2018-03-13 DIAGNOSIS — R21 Rash and other nonspecific skin eruption: Secondary | ICD-10-CM | POA: Diagnosis not present

## 2018-03-13 DIAGNOSIS — Z5111 Encounter for antineoplastic chemotherapy: Secondary | ICD-10-CM

## 2018-03-13 DIAGNOSIS — R05 Cough: Secondary | ICD-10-CM

## 2018-03-13 DIAGNOSIS — H6690 Otitis media, unspecified, unspecified ear: Secondary | ICD-10-CM | POA: Diagnosis not present

## 2018-03-13 DIAGNOSIS — C3431 Malignant neoplasm of lower lobe, right bronchus or lung: Secondary | ICD-10-CM | POA: Diagnosis not present

## 2018-03-13 DIAGNOSIS — R197 Diarrhea, unspecified: Secondary | ICD-10-CM

## 2018-03-13 MED ORDER — AMOXICILLIN-POT CLAVULANATE 875-125 MG PO TABS
1.0000 | ORAL_TABLET | Freq: Two times a day (BID) | ORAL | 0 refills | Status: DC
Start: 2018-03-13 — End: 2018-04-30

## 2018-03-13 MED FILL — AMOX TR-K CLV 875-125 MG TA: 875-125 | 7 days supply | Qty: 14 | Fill #0

## 2018-03-13 NOTE — Telephone Encounter (Signed)
Scheduled appt per 4/16 los - per patient wanted a Monday - unable to come in 6 wks on a Monday due to [place] day - scheduled on following week. Gave patient aVS and calender per los.

## 2018-03-13 NOTE — Telephone Encounter (Signed)
Scheduled appt per 4/16 los -Gave patient AVS and calender per los.  

## 2018-03-13 NOTE — Assessment & Plan Note (Signed)
This is a very pleasant 57 year old Asian female with recurrent non-small cell lung cancer, adenocarcinoma status post surgical resection followed by systemic chemotherapy with carboplatin and Alimta followed by disease progression and the patient was started on treatment with Tarceva 150 mg by mouth daily status post 60 months of treatment. The patient continues to tolerate this treatment well with no concerning complaints except for mild skin rash. She had a recent restaging CT scan to discuss results.  Patient was seen with Dr. Julien Nordmann we discussed that the CT scan shows no evidence of disease progression.  Recommend she continue current treatment Tarceva at the same dose.  Her skin rash is improved today.  She has clindamycin at home if she needs it.  For the cough, she may continue Tussionex.  The patient has left acute otitis media.  A prescription for Augmentin 875 mg twice daily times 1 week was sent to her pharmacy.  The patient will follow-up in approximately 6 weeks for evaluation and repeat lab work.  She was advised to call immediately if she has any concerning symptoms in the interval. All questions were answered. The patient knows to call the clinic with any problems, questions or concerns. We can certainly see the patient much sooner if necessary.

## 2018-03-13 NOTE — Progress Notes (Signed)
[facility] OFFICE PROGRESS NOTE  Golden Circle, FNP [address] [place] [address]  DIAGNOSIS: Local recurrence of non-small cell lung cancer, adenocarcinoma, initially diagnosed as stage IB (T2a N0 M0) in March of 2010.   PRIOR THERAPY:  1. Status post right lower lobectomy under the care of Dr. Cyndia Bent on 04/08/2009. 2. Status post palliative radiotherapy to the right lower lobe recurrent lung mass under the care of Dr. Lisbeth Renshaw, completed on June 08, 2011. 3. Systemic chemotherapy with carboplatin for AUC of 5 and Alimta 500 mg/M2 every 3 weeks. She is status post 3 cycles.   CURRENT THERAPY: Tarceva 150 mg by mouth daily, therapy beginning 07/24/2012. Status post approximately 60 months of therapy.   INTERVAL HISTORY: [name] 57 y.o. female returns for routine follow-up visit accompanied by an interpreter.  The patient is having an increased cough, ear pain, and a sore throat.  Reports that she is been having low-grade fevers on and off and has been taking Tylenol.  Her throat and other symptoms have been present for approximately 5 or 6 days.  She denies chills.  Denies chest pain, shortness of breath, hemoptysis.  Denies nausea, vomiting, constipation, diarrhea.  Denies skin rashes.  The patient is here for discussion of her recent restaging CT scan results.  MEDICAL HISTORY: Past Medical History:  Diagnosis Date  . Drug-induced skin rash 02/27/2017  . Encounter for antineoplastic chemotherapy 01/19/2016  . History of radiation therapy 05/02/11 to 06/08/11   right lung  . Lung cancer ([place])    right lower lobe adenocarcinoma    ALLERGIES:  is allergic to codeine and doxycycline.  MEDICATIONS:  Current Outpatient Medications  Medication Sig Dispense Refill  . acetaminophen (TYLENOL) 500 MG tablet Take 500 mg every 6 (six) hours as needed by mouth.    Marland Kitchen albuterol (PROAIR HFA) 108 (90 Base) MCG/ACT inhaler Inhale 1-2 puffs into the lungs every 6 (six)  hours as needed for wheezing or shortness of breath. 1 Inhaler 2  . amoxicillin-clavulanate (AUGMENTIN) 875-125 MG tablet Take 1 tablet by mouth 2 (two) times daily. 14 tablet 0  . chlorpheniramine-HYDROcodone (TUSSIONEX) 10-8 MG/5ML SUER Take 5 mLs by mouth 2 (two) times daily. 280 mL 0  . clindamycin (CLEOCIN T) 1 % lotion APPLY TO AFFECTED AREA TWICE A DAY 60 mL 0  . diltiazem (CARDIZEM CD) 120 MG 24 hr capsule Take 1 capsule (120 mg total) by mouth daily. 90 capsule 4  . doxycycline (VIBRA-TABS) 100 MG tablet Take 1 tablet (100 mg total) by mouth 2 (two) times daily. 10 tablet 0  . erlotinib (TARCEVA) 150 MG tablet Take 1 tablet ( 150 mg total )  by mouth daily 30 tablet 10  . hydrocortisone 1 % ointment Apply 1 application topically 2 (two) times daily. 30 g 0  . lidocaine-prilocaine (EMLA) cream Apply 1 application topically as needed. 30 g 0   No current facility-administered medications for this visit.     SURGICAL HISTORY:  Past Surgical History:  Procedure Laterality Date  . LUNG LOBECTOMY  04/18/2009   RLL  . PORTACATH PLACEMENT  02/29/2012   Procedure: INSERTION PORT-A-CATH;  Surgeon: Nicanor Alcon, MD;  Location: [facility];  Service: Thoracic;  Laterality: Left;  PowerPort 8 F attachable in left internal jugular.    REVIEW OF SYSTEMS:   Review of Systems  Constitutional: Negative for appetite change, chills, fatigue, and unexpected weight change. Positive for intermittent low-grade fevers.  Using  Tylenol. HENT:   Negative for mouth sores, nosebleeds, and trouble swallowing.  Positive for sore throat and ear pain. Eyes: Negative for eye problems and icterus.  Respiratory: Negative for hemoptysis, shortness of breath and wheezing.  Positive for cough.  Cardiovascular: Negative for chest pain and leg swelling.  Gastrointestinal: Negative for abdominal pain, constipation, diarrhea, nausea and vomiting.  Genitourinary: Negative for bladder incontinence, difficulty urinating, dysuria,  frequency and hematuria.   Musculoskeletal: Negative for back pain, gait problem, neck pain and neck stiffness.  Skin: Negative for itching and rash.  Neurological: Negative for dizziness, extremity weakness, gait problem, headaches, light-headedness and seizures.  Hematological: Negative for adenopathy. Does not bruise/bleed easily.  Psychiatric/Behavioral: Negative for confusion, depression and sleep disturbance. The patient is not nervous/anxious.     PHYSICAL EXAMINATION:  Blood pressure 113/73, pulse 99, temperature 98.2 F (36.8 C), temperature source Oral, resp. rate 17, height 4\' 9"  (1.448 m), weight 121 lb 1.6 oz (54.9 kg), last menstrual period 02/10/2012, SpO2 96 %.  ECOG PERFORMANCE STATUS: 1 - Symptomatic but completely ambulatory  Physical Exam  Constitutional: Oriented to person, place, and time and well-developed, well-nourished, and in no distress. No distress.  HENT:  Head: Normocephalic and atraumatic.  Mouth/Throat: Oropharynx is clear and moist. No oropharyngeal exudate.  Eyes: Conjunctivae are normal. Right eye exhibits no discharge. Left eye exhibits no discharge. No scleral icterus.  Ears: Right tympanic membrane pearly gray.  Left tympanic membrane reddened.  No drainage noted. Neck: Normal range of motion. Neck supple.  Cardiovascular: Normal rate, regular rhythm, normal heart sounds and intact distal pulses.   Pulmonary/Chest: Effort normal and breath sounds normal. No respiratory distress. No wheezes. No rales.  Abdominal: Soft. Bowel sounds are normal. Exhibits no distension and no mass. There is no tenderness.  Musculoskeletal: Normal range of motion. Exhibits no edema.  Lymphadenopathy:    No cervical adenopathy.  Neurological: Alert and oriented to person, place, and time. Exhibits normal muscle tone. Gait normal. Coordination normal.  Skin: Skin is warm and dry. No rash noted. Not diaphoretic. No erythema. No pallor.  Psychiatric: Mood, memory and  judgment normal.  Vitals reviewed.  LABORATORY DATA: Lab Results  Component Value Date   WBC 12.9 (H) 03/09/2018   HGB 13.1 12/11/2017   HCT 37.0 03/09/2018   MCV 88.9 03/09/2018   PLT 300 03/09/2018      Chemistry      Component Value Date/Time   NA 141 03/09/2018 1301   NA 143 10/30/2017 1031   K 3.2 (L) 03/09/2018 1301   K 4.3 10/30/2017 1031   CL 103 03/09/2018 1301   CL 102 04/30/2013 1002   CO2 29 03/09/2018 1301   CO2 29 10/30/2017 1031   BUN 8 03/09/2018 1301   BUN 14.6 10/30/2017 1031   CREATININE 0.66 03/09/2018 1301   CREATININE 0.7 10/30/2017 1031      Component Value Date/Time   CALCIUM 9.2 03/09/2018 1301   CALCIUM 9.2 10/30/2017 1031   ALKPHOS 117 03/09/2018 1301   ALKPHOS 103 10/30/2017 1031   AST 36 (H) 03/09/2018 1301   AST 20 10/30/2017 1031   ALT 39 03/09/2018 1301   ALT 14 10/30/2017 1031   BILITOT 1.0 03/09/2018 1301   BILITOT 0.79 10/30/2017 1031       RADIOGRAPHIC STUDIES:  Ct Chest W Contrast  Result Date: 03/09/2018 CLINICAL DATA:  Followup right lung adenocarcinoma. Previous surgery, radiation therapy, and chemotherapy. EXAM: CT CHEST WITH CONTRAST TECHNIQUE: Multidetector CT imaging of  the chest was performed during intravenous contrast administration. CONTRAST:  63mL OMNIPAQUE IOHEXOL 300 MG/ML  SOLN COMPARISON:  10/30/2017 FINDINGS: Cardiovascular: No acute findings. Aortic and coronary artery atherosclerosis. Mediastinum/Nodes: No masses or pathologically enlarged lymph nodes identified. Lungs/Pleura: Posttreatment changes again seen in the right perihilar region. Enhancing masslike opacity in the posterior right lower lung measures 3.5 x 2.5 cm on image 63/2, without significant change compared to prior exam. Small right pleural effusion or pleural thickening with peripheral enhancement is also stable. Irregular nodular opacity in the right lung apex measuring 1.4 x 0.6 cm on image 20/7 is also stable. No new or enlarging areas of  pulmonary opacity. Left lung is clear. Upper Abdomen:  Unremarkable. Musculoskeletal:  No suspicious bone lesions. IMPRESSION: Stable right lung post treatment changes and enhancing masslike opacity in the posterior right lower lung. Right apical pulmonary nodule is also stable. Stable small right pleural effusion or thickening. No new or progressive disease within the thorax. Aortic Atherosclerosis (ICD10-I70.0). Coronary artery calcification. Electronically Signed   By: Earle Gell M.D.   On: 03/09/2018 19:22     ASSESSMENT/PLAN:  Primary cancer of right lower lobe of lung [facility]) This is a very pleasant 57 year old Asian female with recurrent non-small cell lung cancer, adenocarcinoma status post surgical resection followed by systemic chemotherapy with carboplatin and Alimta followed by disease progression and the patient was started on treatment with Tarceva 150 mg by mouth daily status post 60 months of treatment. The patient continues to tolerate this treatment well with no concerning complaints except for mild skin rash. She had a recent restaging CT scan to discuss results.  Patient was seen with Dr. Julien Nordmann we discussed that the CT scan shows no evidence of disease progression.  Recommend she continue current treatment Tarceva at the same dose.  Her skin rash is improved today.  She has clindamycin at home if she needs it.  For the cough, she may continue Tussionex.  The patient has left acute otitis media.  A prescription for Augmentin 875 mg twice daily times 1 week was sent to her pharmacy.  The patient will follow-up in approximately 6 weeks for evaluation and repeat lab work.  She was advised to call immediately if she has any concerning symptoms in the interval. All questions were answered. The patient knows to call the clinic with any problems, questions or concerns. We can certainly see the patient much sooner if necessary.   Orders Placed This Encounter  Procedures  . CBC  with Differential (Cancer Center Only)    Standing Status:   Future    Standing Expiration Date:   03/14/2019  . CMP ([facility] only)    Standing Status:   Future    Standing Expiration Date:   03/14/2019   Mikey Bussing, DNP, AGPCNP-BC, AOCNP 03/13/18   ADDENDUM: Hematology/Oncology Attending: I had a face-to-face encounter with the patient.  I recommended her care plan.  This is a very pleasant 58 years old Asian female with recurrent non-small cell lung cancer, adenocarcinoma.  The patient is currently on treatment with Tarceva 150 mg p.o. daily status post 5 years of treatment and has been tolerating it fairly well except for mild to skin rash and occasional episodes of diarrhea.  She is suffering recently from cold symptoms and chest congestion.  She also has left acute otitis media. The patient had repeat CT scan of the chest performed recently.  I personally and independently reviewed the scans and discussed the  results with the patient today.  Her scan showed no evidence for disease progression.  I recommended for the patient to continue her treatment with Tarceva with the same dose. For the acute otitis, we started the patient on Augmentin 875 mg p.o. twice daily for 1 week. She will come back for follow-up visit in 6 weeks for evaluation with repeat blood work. The patient was advised to call immediately if she has any concerning symptoms in the interval.  Disclaimer: This note was dictated with voice recognition software. Similar sounding words can inadvertently be transcribed and may be missed upon review. [name], MD 03/15/18

## 2018-03-29 ENCOUNTER — Telehealth: Payer: Self-pay | Admitting: Medical Oncology

## 2018-03-29 NOTE — Telephone Encounter (Signed)
Pt  still is not feeling well and has a sore throat and coughs all the time.  Pt finished antibiotic for ear infection. Next appt 6/3.

## 2018-03-29 NOTE — Telephone Encounter (Signed)
Her last scan was good.  She may need to see her family doctor for evaluation of the bronchitis.

## 2018-03-30 NOTE — Telephone Encounter (Signed)
[facility] notified.

## 2018-04-20 ENCOUNTER — Other Ambulatory Visit: Payer: Self-pay | Admitting: Internal Medicine

## 2018-04-20 DIAGNOSIS — C3431 Malignant neoplasm of lower lobe, right bronchus or lung: Secondary | ICD-10-CM

## 2018-04-20 DIAGNOSIS — Z95828 Presence of other vascular implants and grafts: Secondary | ICD-10-CM

## 2018-04-20 DIAGNOSIS — Z5111 Encounter for antineoplastic chemotherapy: Secondary | ICD-10-CM

## 2018-04-20 DIAGNOSIS — C3491 Malignant neoplasm of unspecified part of right bronchus or lung: Secondary | ICD-10-CM

## 2018-04-20 MED FILL — HYDROCODONE-CHLORPHENIRAM S: 10-8 | 28 days supply | Qty: 280 | Fill #0

## 2018-04-30 ENCOUNTER — Telehealth: Payer: Self-pay

## 2018-04-30 ENCOUNTER — Encounter: Payer: Self-pay | Admitting: Internal Medicine

## 2018-04-30 ENCOUNTER — Inpatient Hospital Stay: Payer: Medicare Other | Attending: Internal Medicine | Admitting: Internal Medicine

## 2018-04-30 ENCOUNTER — Inpatient Hospital Stay: Payer: Medicare Other

## 2018-04-30 DIAGNOSIS — C3431 Malignant neoplasm of lower lobe, right bronchus or lung: Secondary | ICD-10-CM | POA: Diagnosis not present

## 2018-04-30 DIAGNOSIS — R05 Cough: Secondary | ICD-10-CM | POA: Insufficient documentation

## 2018-04-30 DIAGNOSIS — Z9221 Personal history of antineoplastic chemotherapy: Secondary | ICD-10-CM | POA: Insufficient documentation

## 2018-04-30 DIAGNOSIS — Z95828 Presence of other vascular implants and grafts: Secondary | ICD-10-CM

## 2018-04-30 DIAGNOSIS — Z5111 Encounter for antineoplastic chemotherapy: Secondary | ICD-10-CM

## 2018-04-30 DIAGNOSIS — C3491 Malignant neoplasm of unspecified part of right bronchus or lung: Secondary | ICD-10-CM

## 2018-04-30 LAB — CBC WITH DIFFERENTIAL (CANCER CENTER ONLY)
Basophils Absolute: 0 10*3/uL (ref 0.0–0.1)
Basophils Relative: 0 %
Eosinophils Absolute: 0.1 10*3/uL (ref 0.0–0.5)
Eosinophils Relative: 1 %
HCT: 39.8 % (ref 34.8–46.6)
Hemoglobin: 12.7 g/dL (ref 11.6–15.9)
Lymphocytes Relative: 21 %
Lymphs Abs: 1.4 10*3/uL (ref 0.9–3.3)
MCH: 28.3 pg (ref 25.1–34.0)
MCHC: 31.9 g/dL (ref 31.5–36.0)
MCV: 88.6 fL (ref 79.5–101.0)
Monocytes Absolute: 0.6 10*3/uL (ref 0.1–0.9)
Monocytes Relative: 9 %
Neutro Abs: 4.8 10*3/uL (ref 1.5–6.5)
Neutrophils Relative %: 69 %
Platelet Count: 301 10*3/uL (ref 145–400)
RBC: 4.49 MIL/uL (ref 3.70–5.45)
RDW: 14.2 % (ref 11.2–14.5)
WBC Count: 6.9 10*3/uL (ref 3.9–10.3)

## 2018-04-30 LAB — CMP (CANCER CENTER ONLY)
ALT: 17 U/L (ref 0–55)
AST: 25 U/L (ref 5–34)
Albumin: 4 g/dL (ref 3.5–5.0)
Alkaline Phosphatase: 79 U/L (ref 40–150)
Anion gap: 6 (ref 3–11)
BUN: 11 mg/dL (ref 7–26)
CO2: 30 mmol/L — ABNORMAL HIGH (ref 22–29)
Calcium: 9.3 mg/dL (ref 8.4–10.4)
Chloride: 104 mmol/L (ref 98–109)
Creatinine: 0.65 mg/dL (ref 0.60–1.10)
GFR, Est AFR Am: >60 mL/min (ref >60)
GFR, Estimated: >60 mL/min (ref >60)
Glucose, Bld: 96 mg/dL (ref 70–140)
Potassium: 4.1 mmol/L (ref 3.5–5.1)
Sodium: 140 mmol/L (ref 136–145)
Total Bilirubin: 1.1 mg/dL (ref 0.2–1.2)
Total Protein: 7.6 g/dL (ref 6.4–8.3)

## 2018-04-30 MED ORDER — ALBUTEROL SULFATE HFA 108 (90 BASE) MCG/ACT IN AERS: 1.0000 | INHALATION_SPRAY | Freq: Four times a day (QID) | RESPIRATORY_TRACT | 2 refills | Status: DC | PRN

## 2018-04-30 MED ORDER — LIDOCAINE-PRILOCAINE 2.5-2.5 % EX CREA: 1.0000 "application " | TOPICAL_CREAM | CUTANEOUS | 0 refills | Status: DC | PRN

## 2018-04-30 MED ORDER — HYDROCOD POLST-CPM POLST ER 10-8 MG/5ML PO SUER: ORAL | 0 refills | Status: DC

## 2018-04-30 MED FILL — VENTOLIN HFA 90 MCG INHALER: 108 (90 BAS | 25 days supply | Qty: 18 | Fill #0

## 2018-04-30 MED FILL — LIDOCAINE-PRILOCAINE CREAM: 2.5-2.5 | 10 days supply | Qty: 30 | Fill #0

## 2018-04-30 NOTE — Telephone Encounter (Signed)
Printed avs and calender of upcoming appointment. Per 6/3 los. Patient can only schedule on Monday's due to work

## 2018-04-30 NOTE — Progress Notes (Signed)
[facility] [phone]   [phone]  OFFICE PROGRESS NOTE  Golden Circle, FNP [address] [place] [address]  PRINCIPAL DIAGNOSIS: Local recurrence of non-small cell lung cancer, adenocarcinoma, initially diagnosed as stage IB (T2a N0 M0) in March of 2010.   PRIOR THERAPY:  1. Status post right lower lobectomy under the care of Dr. Cyndia Bent on 04/08/2009. 2. Status post palliative radiotherapy to the right lower lobe recurrent lung mass under the care of Dr. Lisbeth Renshaw, completed on June 08, 2011. 3. Systemic chemotherapy with carboplatin for AUC of 5 and Alimta 500 mg/M2 every 3 weeks. She is status post 3 cycles.  CURRENT THERAPY: Tarceva 150 mg by mouth daily, therapy beginning 07/24/2012. Status post approximately 60 months of therapy.   INTERVAL HISTORY: [name] 57 y.o. female returns to the clinic today for 6 weeks follow-up visit.  The patient is feeling fine today with no specific complaints except for the dry cough and intermittent wheezes.  She also has 1 or 2 episodes of crampy abdominal pain in the last few weeks.  She denied having any skin rash or diarrhea.  She denied having any recent weight loss or night sweats.  She has no nausea, vomiting, diarrhea or constipation.  She has no headache or visual changes.  She had repeat CBC and comprehensive metabolic panel performed earlier today and she is here for evaluation and discussion of her lab results.  MEDICAL HISTORY: Past Medical History:  Diagnosis Date  . Drug-induced skin rash 02/27/2017  . Encounter for antineoplastic chemotherapy 01/19/2016  . History of radiation therapy 05/02/11 to 06/08/11   right lung  . Lung cancer ([place])    right lower lobe adenocarcinoma    ALLERGIES:  is allergic to codeine and doxycycline.  MEDICATIONS:  Current Outpatient Medications  Medication Sig Dispense Refill  . acetaminophen (TYLENOL) 500 MG tablet Take 500 mg every 6 (six)  hours as needed by mouth.    Marland Kitchen albuterol (PROAIR HFA) 108 (90 Base) MCG/ACT inhaler Inhale 1-2 puffs into the lungs every 6 (six) hours as needed for wheezing or shortness of breath. 1 Inhaler 2  . amoxicillin-clavulanate (AUGMENTIN) 875-125 MG tablet Take 1 tablet by mouth 2 (two) times daily. 14 tablet 0  . chlorpheniramine-HYDROcodone (TUSSIONEX) 10-8 MG/5ML SUER TAKE 5 MLS BY MOUTH TWICE DAILY 280 mL 0  . clindamycin (CLEOCIN T) 1 % lotion APPLY TO AFFECTED AREA TWICE A DAY 60 mL 0  . diltiazem (CARDIZEM CD) 120 MG 24 hr capsule Take 1 capsule (120 mg total) by mouth daily. 90 capsule 4  . doxycycline (VIBRA-TABS) 100 MG tablet Take 1 tablet (100 mg total) by mouth 2 (two) times daily. 10 tablet 0  . erlotinib (TARCEVA) 150 MG tablet Take 1 tablet ( 150 mg total )  by mouth daily 30 tablet 10  . hydrocortisone 1 % ointment Apply 1 application topically 2 (two) times daily. 30 g 0  . lidocaine-prilocaine (EMLA) cream Apply 1 application topically as needed. 30 g 0   No current facility-administered medications for this visit.     SURGICAL HISTORY:  Past Surgical History:  Procedure Laterality Date  . LUNG LOBECTOMY  04/18/2009   RLL  . PORTACATH PLACEMENT  02/29/2012   Procedure: INSERTION PORT-A-CATH;  Surgeon: Nicanor Alcon, MD;  Location: [facility];  Service: Thoracic;  Laterality: Left;  PowerPort 8 F attachable in left internal jugular.    REVIEW  OF SYSTEMS:  A comprehensive review of systems was negative except for: Respiratory: positive for cough   PHYSICAL EXAMINATION: General appearance: alert, cooperative and no distress Head: Normocephalic, without obvious abnormality, atraumatic Neck: no adenopathy, no JVD, supple, symmetrical, trachea midline and thyroid not enlarged, symmetric, no tenderness/mass/nodules Lymph nodes: Cervical, supraclavicular, and axillary nodes normal. Resp: wheezes bilaterally Back: symmetric, no curvature. ROM normal. No CVA tenderness. Cardio: regular  rate and rhythm, S1, S2 normal, no murmur, click, rub or gallop GI: soft, non-tender; bowel sounds normal; no masses,  no organomegaly Extremities: extremities normal, atraumatic, no cyanosis or edema  ECOG PERFORMANCE STATUS: 1 - Symptomatic but completely ambulatory  Blood pressure 130/90, pulse 100, temperature 98.7 F (37.1 C), temperature source Oral, resp. rate 18, height 4\' 9"  (1.448 m), weight 122 lb 8 oz (55.6 kg), last menstrual period 02/10/2012, SpO2 100 %.  LABORATORY DATA: Lab Results  Component Value Date   WBC 6.9 04/30/2018   HGB 12.7 04/30/2018   HCT 39.8 04/30/2018   MCV 88.6 04/30/2018   PLT 301 04/30/2018      Chemistry      Component Value Date/Time   NA 141 03/09/2018 1301   NA 143 10/30/2017 1031   K 3.2 (L) 03/09/2018 1301   K 4.3 10/30/2017 1031   CL 103 03/09/2018 1301   CL 102 04/30/2013 1002   CO2 29 03/09/2018 1301   CO2 29 10/30/2017 1031   BUN 8 03/09/2018 1301   BUN 14.6 10/30/2017 1031   CREATININE 0.66 03/09/2018 1301   CREATININE 0.7 10/30/2017 1031      Component Value Date/Time   CALCIUM 9.2 03/09/2018 1301   CALCIUM 9.2 10/30/2017 1031   ALKPHOS 117 03/09/2018 1301   ALKPHOS 103 10/30/2017 1031   AST 36 (H) 03/09/2018 1301   AST 20 10/30/2017 1031   ALT 39 03/09/2018 1301   ALT 14 10/30/2017 1031   BILITOT 1.0 03/09/2018 1301   BILITOT 0.79 10/30/2017 1031       RADIOGRAPHIC STUDIES: No results found.  ASSESSMENT AND PLAN:  This is a very pleasant 57 years old Asian female with recurrent non-small cell lung cancer, adenocarcinoma status post surgical resection followed by systemic chemotherapy with carboplatin and Alimta followed by disease progression and the patient was started on treatment with Tarceva 150 mg by mouth daily status post 60 months of treatment. The patient continues to tolerate this treatment well.  I recommended for her to stay on Tarceva with the same dose for now.  I will see her back for follow-up  visit in 6 weeks for evaluation with repeat blood work. I gave the patient refill today for Tussionex, albuterol as well as Emla Cream. She was advised to call immediately if she has any concerning symptoms in the interval. All questions were answered. The patient knows to call the clinic with any problems, questions or concerns. We can certainly see the patient much sooner if necessary.  Disclaimer: This note was dictated with voice recognition software. Similar sounding words can inadvertently be transcribed and may not be corrected upon review.

## 2018-05-17 MED FILL — HYDROCODONE-CHLORPHEN ER SU: 10-8 | 28 days supply | Qty: 280 | Fill #0

## 2018-06-07 NOTE — Progress Notes (Signed)
[facility] OFFICE PROGRESS NOTE  Golden Circle, FNP [address] [place] [address]  PRINCIPAL DIAGNOSIS: Local recurrence of non-small cell lung cancer, adenocarcinoma, initially diagnosed as stage IB (T2a N0 M0) in March of 2010.   PRIOR THERAPY:  1. Status post right lower lobectomy under the care of Dr. Cyndia Bent on 04/08/2009. 2. Status post palliative radiotherapy to the right lower lobe recurrent lung mass under the care of Dr. Lisbeth Renshaw, completed on June 08, 2011. 3. Systemic chemotherapy with carboplatin for AUC of 5 and Alimta 500 mg/M2 every 3 weeks. She is status post 3 cycles.  CURRENT THERAPY: Tarceva 150 mg by mouth daily, therapy beginning 07/24/2012. Status post approximately 68 months of therapy.   INTERVAL HISTORY: [name] 57 y.o. female returns for routine follow-up visit accompanied by an interpreter.  The patient is feeling fine today with no specific complaints today for ongoing nonproductive cough.  She uses Tussionex which is helpful.  The patient denies fevers and chills.  Denies chest pain, shortness of breath, cough, hemoptysis.  Denies nausea, vomiting, constipation, diarrhea.  Denies headaches or visual disturbances.  Denies recent weight loss or night sweats.  The patient is here for evaluation and repeat lab work.  MEDICAL HISTORY: Past Medical History:  Diagnosis Date  . Drug-induced skin rash 02/27/2017  . Encounter for antineoplastic chemotherapy 01/19/2016  . History of radiation therapy 05/02/11 to 06/08/11   right lung  . Lung cancer ([place])    right lower lobe adenocarcinoma    ALLERGIES:  is allergic to codeine and doxycycline.  MEDICATIONS:  Current Outpatient Medications  Medication Sig Dispense Refill  . acetaminophen (TYLENOL) 500 MG tablet Take 500 mg every 6 (six) hours as needed by mouth.    Marland Kitchen albuterol (PROAIR HFA) 108 (90 Base) MCG/ACT inhaler Inhale 1-2 puffs into the lungs every 6 (six) hours as needed for  wheezing or shortness of breath. 1 Inhaler 2  . chlorpheniramine-HYDROcodone (TUSSIONEX) 10-8 MG/5ML SUER TAKE 5 MLS BY MOUTH TWICE DAILY 280 mL 0  . clindamycin (CLEOCIN T) 1 % lotion APPLY TO AFFECTED AREA TWICE A DAY 60 mL 0  . diltiazem (CARDIZEM CD) 120 MG 24 hr capsule Take 1 capsule (120 mg total) by mouth daily. 90 capsule 4  . erlotinib (TARCEVA) 150 MG tablet Take 1 tablet ( 150 mg total )  by mouth daily 30 tablet 10  . lidocaine-prilocaine (EMLA) cream Apply 1 application topically as needed. 30 g 0   No current facility-administered medications for this visit.     SURGICAL HISTORY:  Past Surgical History:  Procedure Laterality Date  . LUNG LOBECTOMY  04/18/2009   RLL  . PORTACATH PLACEMENT  02/29/2012   Procedure: INSERTION PORT-A-CATH;  Surgeon: Nicanor Alcon, MD;  Location: [place];  Service: Thoracic;  Laterality: Left;  PowerPort 8 F attachable in left internal jugular.    REVIEW OF SYSTEMS:   Review of Systems  Constitutional: Negative for appetite change, chills, fatigue, fever and unexpected weight change.  HENT:   Negative for mouth sores, nosebleeds, sore throat and trouble swallowing.   Eyes: Negative for eye problems and icterus.  Respiratory: Negative for hemoptysis, shortness of breath and wheezing.  Positive for nonproductive cough. Cardiovascular: Negative for chest pain and leg swelling.  Gastrointestinal: Negative for abdominal pain, constipation, diarrhea, nausea and vomiting.  Genitourinary: Negative for bladder incontinence, difficulty urinating, dysuria, frequency and hematuria.   Musculoskeletal: Negative for back pain, gait problem,  neck pain and neck stiffness.  Skin: Negative for itching and rash.  Neurological: Negative for dizziness, extremity weakness, gait problem, headaches, light-headedness and seizures.  Hematological: Negative for adenopathy. Does not bruise/bleed easily.  Psychiatric/Behavioral: Negative for confusion, depression and sleep  disturbance. The patient is not nervous/anxious.     PHYSICAL EXAMINATION:  Blood pressure (!) 127/91, pulse (!) 106, temperature 98.1 F (36.7 C), temperature source Oral, resp. rate 18, height 4\' 9"  (1.448 m), weight 126 lb 4.8 oz (57.3 kg), last menstrual period 02/10/2012, SpO2 100 %.  ECOG PERFORMANCE STATUS: 1 - Symptomatic but completely ambulatory  Physical Exam  Constitutional: Oriented to person, place, and time and well-developed, well-nourished, and in no distress. No distress.  HENT:  Head: Normocephalic and atraumatic.  Mouth/Throat: Oropharynx is clear and moist. No oropharyngeal exudate.  Eyes: Conjunctivae are normal. Right eye exhibits no discharge. Left eye exhibits no discharge. No scleral icterus.  Neck: Normal range of motion. Neck supple.  Cardiovascular: Normal rate, regular rhythm, normal heart sounds and intact distal pulses.   Pulmonary/Chest: Effort normal and breath sounds normal. No respiratory distress. No wheezes. No rales.  Abdominal: Soft. Bowel sounds are normal. Exhibits no distension and no mass. There is no tenderness.  Musculoskeletal: Normal range of motion. Exhibits no edema.  Lymphadenopathy:    No cervical adenopathy.  Neurological: Alert and oriented to person, place, and time. Exhibits normal muscle tone. Gait normal. Coordination normal.  Skin: Skin is warm and dry. No rash noted. Not diaphoretic. No erythema. No pallor.  Psychiatric: Mood, memory and judgment normal.  Vitals reviewed.  LABORATORY DATA: Lab Results  Component Value Date   WBC 7.8 06/11/2018   HGB 12.3 06/11/2018   HCT 37.2 06/11/2018   MCV 86.0 06/11/2018   PLT 328 06/11/2018      Chemistry      Component Value Date/Time   NA 140 06/11/2018 1348   NA 143 10/30/2017 1031   K 3.6 06/11/2018 1348   K 4.3 10/30/2017 1031   CL 105 06/11/2018 1348   CL 102 04/30/2013 1002   CO2 28 06/11/2018 1348   CO2 29 10/30/2017 1031   BUN 11 06/11/2018 1348   BUN 14.6  10/30/2017 1031   CREATININE 0.65 06/11/2018 1348   CREATININE 0.7 10/30/2017 1031      Component Value Date/Time   CALCIUM 8.7 (L) 06/11/2018 1348   CALCIUM 9.2 10/30/2017 1031   ALKPHOS 84 06/11/2018 1348   ALKPHOS 103 10/30/2017 1031   AST 24 06/11/2018 1348   AST 20 10/30/2017 1031   ALT 22 06/11/2018 1348   ALT 14 10/30/2017 1031   BILITOT 0.6 06/11/2018 1348   BILITOT 0.79 10/30/2017 1031       RADIOGRAPHIC STUDIES:  No results found.   ASSESSMENT/PLAN:  Primary cancer of right lower lobe of lung ([place]) This is a very pleasant 57 year old Asian female with recurrent non-small cell lung cancer, adenocarcinoma status post surgical resection followed by systemic chemotherapy with carboplatin and Alimta followed by disease progression and the patient was started on treatment with Tarceva 150 mg by mouth daily status post more than 68 months of treatment. The patient continues to tolerate this treatment well.  I recommended for her to stay on Tarceva with the same dose for now.   The patient will have a restaging CT scan of the chest in approximately 6 weeks. She will follow-up 1 to 2 days after the CT scan to discuss the results.  For  her cough, she may continue on Tussionex.  This was refilled for her today.  She was advised to call immediately if she has any concerning symptoms in the interval. All questions were answered. The patient knows to call the clinic with any problems, questions or concerns. We can certainly see the patient much sooner if necessary.   Orders Placed This Encounter  Procedures  . CT CHEST W CONTRAST    Call daughter to schedule: Addison Naegeli   [phone]    Standing Status:   Future    Standing Expiration Date:   06/12/2019    Order Specific Question:   If indicated for the ordered procedure, I authorize the administration of contrast media per Radiology protocol    Answer:   Yes    Order Specific Question:   Preferred imaging location?     Answer:   [facility]    Order Specific Question:   Radiology Contrast Protocol - do NOT remove file path    Answer:   [email or web address]    Order Specific Question:   ** REASON FOR EXAM (FREE TEXT)    Answer:   Lung cancer, Restaging    Order Specific Question:   Is patient pregnant?    Answer:   No  . CBC with Differential (Cancer Center Only)    Standing Status:   Future    Standing Expiration Date:   06/12/2019  . CMP ([place] only)    Standing Status:   Future    Standing Expiration Date:   06/12/2019   Mikey Bussing, DNP, AGPCNP-BC, AOCNP 06/11/18

## 2018-06-07 NOTE — Assessment & Plan Note (Addendum)
This is a very pleasant 57 year old Asian female with recurrent non-small cell lung cancer, adenocarcinoma status post surgical resection followed by systemic chemotherapy with carboplatin and Alimta followed by disease progression and the patient was started on treatment with Tarceva 150 mg by mouth daily status post more than 68 months of treatment. The patient continues to tolerate this treatment well.  I recommended for her to stay on Tarceva with the same dose for now.   The patient will have a restaging CT scan of the chest in approximately 6 weeks. She will follow-up 1 to 2 days after the CT scan to discuss the results.  For her cough, she may continue on Tussionex.  This was refilled for her today.  She was advised to call immediately if she has any concerning symptoms in the interval. All questions were answered. The patient knows to call the clinic with any problems, questions or concerns. We can certainly see the patient much sooner if necessary.

## 2018-06-11 ENCOUNTER — Inpatient Hospital Stay: Payer: Medicare Other | Attending: Internal Medicine

## 2018-06-11 ENCOUNTER — Telehealth: Payer: Self-pay | Admitting: Internal Medicine

## 2018-06-11 ENCOUNTER — Inpatient Hospital Stay (HOSPITAL_BASED_OUTPATIENT_CLINIC_OR_DEPARTMENT_OTHER): Payer: Medicare Other | Admitting: Oncology

## 2018-06-11 ENCOUNTER — Encounter: Payer: Self-pay | Admitting: Oncology

## 2018-06-11 VITALS — BP 127/91 | HR 106 | Temp 98.1°F | Resp 18 | Ht <= 58 in | Wt 126.3 lb

## 2018-06-11 DIAGNOSIS — Z923 Personal history of irradiation: Secondary | ICD-10-CM

## 2018-06-11 DIAGNOSIS — C3491 Malignant neoplasm of unspecified part of right bronchus or lung: Secondary | ICD-10-CM

## 2018-06-11 DIAGNOSIS — C3431 Malignant neoplasm of lower lobe, right bronchus or lung: Secondary | ICD-10-CM | POA: Insufficient documentation

## 2018-06-11 DIAGNOSIS — R05 Cough: Secondary | ICD-10-CM

## 2018-06-11 DIAGNOSIS — Z95828 Presence of other vascular implants and grafts: Secondary | ICD-10-CM

## 2018-06-11 DIAGNOSIS — Z5111 Encounter for antineoplastic chemotherapy: Secondary | ICD-10-CM

## 2018-06-11 LAB — CMP (CANCER CENTER ONLY)
ALT: 22 U/L (ref 0–44)
AST: 24 U/L (ref 15–41)
Albumin: 3.8 g/dL (ref 3.5–5.0)
Alkaline Phosphatase: 84 U/L (ref 38–126)
Anion gap: 7 (ref 5–15)
BUN: 11 mg/dL (ref 6–20)
CO2: 28 mmol/L (ref 22–32)
Calcium: 8.7 mg/dL — ABNORMAL LOW (ref 8.9–10.3)
Chloride: 105 mmol/L (ref 98–111)
Creatinine: 0.65 mg/dL (ref 0.44–1.00)
GFR, Est AFR Am: >60 mL/min (ref >60)
GFR, Estimated: >60 mL/min (ref >60)
Glucose, Bld: 111 mg/dL — ABNORMAL HIGH (ref 70–99)
Potassium: 3.6 mmol/L (ref 3.5–5.1)
Sodium: 140 mmol/L (ref 135–145)
Total Bilirubin: 0.6 mg/dL (ref 0.3–1.2)
Total Protein: 7.4 g/dL (ref 6.5–8.1)

## 2018-06-11 LAB — CBC WITH DIFFERENTIAL (CANCER CENTER ONLY)
Basophils Absolute: 0.1 10*3/uL (ref 0.0–0.1)
Basophils Relative: 1 %
Eosinophils Absolute: 0.1 10*3/uL (ref 0.0–0.5)
Eosinophils Relative: 1 %
HCT: 37.2 % (ref 34.8–46.6)
Hemoglobin: 12.3 g/dL (ref 11.6–15.9)
Lymphocytes Relative: 22 %
Lymphs Abs: 1.7 10*3/uL (ref 0.9–3.3)
MCH: 28.5 pg (ref 25.1–34.0)
MCHC: 33.1 g/dL (ref 31.5–36.0)
MCV: 86 fL (ref 79.5–101.0)
Monocytes Absolute: 0.6 10*3/uL (ref 0.1–0.9)
Monocytes Relative: 8 %
Neutro Abs: 5.3 10*3/uL (ref 1.5–6.5)
Neutrophils Relative %: 68 %
Platelet Count: 328 10*3/uL (ref 145–400)
RBC: 4.33 MIL/uL (ref 3.70–5.45)
RDW: 14.2 % (ref 11.2–14.5)
WBC Count: 7.8 10*3/uL (ref 3.9–10.3)

## 2018-06-11 MED ORDER — HYDROCOD POLST-CPM POLST ER 10-8 MG/5ML PO SUER: ORAL | 0 refills | Status: DC

## 2018-06-11 NOTE — Telephone Encounter (Signed)
Gave patient avs report and appointments for August and September. Dates per patient due to she can only come on Mondays's and Monday 9/2 is a holiday. Central radiology will call re scan.

## 2018-06-11 NOTE — Telephone Encounter (Signed)
Gave patient avs report and appointments for August and September. Central radiology will call re scan.

## 2018-06-12 MED FILL — HYDROCODONE-CHLORPHEN ER SU: 10-8 | 28 days supply | Qty: 280 | Fill #0

## 2018-07-23 ENCOUNTER — Ambulatory Visit (HOSPITAL_COMMUNITY)
Admission: RE | Admit: 2018-07-23 | Discharge: 2018-07-23 | Disposition: A | Discharge status: Home / Self Care | Payer: Medicare Other | Source: Ambulatory Visit | Attending: Oncology | Admitting: Oncology

## 2018-07-23 ENCOUNTER — Inpatient Hospital Stay: Payer: Medicare Other | Attending: Internal Medicine

## 2018-07-23 DIAGNOSIS — J90 Pleural effusion, not elsewhere classified: Secondary | ICD-10-CM | POA: Diagnosis not present

## 2018-07-23 DIAGNOSIS — R918 Other nonspecific abnormal finding of lung field: Secondary | ICD-10-CM | POA: Insufficient documentation

## 2018-07-23 DIAGNOSIS — C3431 Malignant neoplasm of lower lobe, right bronchus or lung: Secondary | ICD-10-CM | POA: Insufficient documentation

## 2018-07-23 DIAGNOSIS — I2510 Atherosclerotic heart disease of native coronary artery without angina pectoris: Secondary | ICD-10-CM | POA: Insufficient documentation

## 2018-07-23 DIAGNOSIS — C3491 Malignant neoplasm of unspecified part of right bronchus or lung: Secondary | ICD-10-CM | POA: Diagnosis not present

## 2018-07-23 DIAGNOSIS — I700 Atherosclerosis of aorta: Secondary | ICD-10-CM | POA: Insufficient documentation

## 2018-07-23 LAB — CBC WITH DIFFERENTIAL (CANCER CENTER ONLY)
Basophils Absolute: 0 10*3/uL (ref 0.0–0.1)
Basophils Relative: 1 %
Eosinophils Absolute: 0.1 10*3/uL (ref 0.0–0.5)
Eosinophils Relative: 1 %
HCT: 40.7 % (ref 34.8–46.6)
Hemoglobin: 13.5 g/dL (ref 11.6–15.9)
Lymphocytes Relative: 17 %
Lymphs Abs: 1.2 10*3/uL (ref 0.9–3.3)
MCH: 28.4 pg (ref 25.1–34.0)
MCHC: 33.1 g/dL (ref 31.5–36.0)
MCV: 85.8 fL (ref 79.5–101.0)
Monocytes Absolute: 0.5 10*3/uL (ref 0.1–0.9)
Monocytes Relative: 6 %
Neutro Abs: 5.4 10*3/uL (ref 1.5–6.5)
Neutrophils Relative %: 75 %
Platelet Count: 331 10*3/uL (ref 145–400)
RBC: 4.74 MIL/uL (ref 3.70–5.45)
RDW: 13.9 % (ref 11.2–14.5)
WBC Count: 7.1 10*3/uL (ref 3.9–10.3)

## 2018-07-23 LAB — CMP (CANCER CENTER ONLY)
ALT: 26 U/L (ref 0–44)
AST: 29 U/L (ref 15–41)
Albumin: 4 g/dL (ref 3.5–5.0)
Alkaline Phosphatase: 108 U/L (ref 38–126)
Anion gap: 8 (ref 5–15)
BUN: 11 mg/dL (ref 6–20)
CO2: 30 mmol/L (ref 22–32)
Calcium: 9.8 mg/dL (ref 8.9–10.3)
Chloride: 104 mmol/L (ref 98–111)
Creatinine: 0.69 mg/dL (ref 0.44–1.00)
GFR, Est AFR Am: >60 mL/min (ref >60)
GFR, Estimated: >60 mL/min (ref >60)
Glucose, Bld: 114 mg/dL — ABNORMAL HIGH (ref 70–99)
Potassium: 4.7 mmol/L (ref 3.5–5.1)
Sodium: 142 mmol/L (ref 135–145)
Total Bilirubin: 0.8 mg/dL (ref 0.3–1.2)
Total Protein: 8 g/dL (ref 6.5–8.1)

## 2018-07-23 MED ORDER — HEPARIN SOD (PORK) LOCK FLUSH 100 UNIT/ML IV SOLN
INTRAVENOUS | Status: AC
  Administered 2018-07-23: 500 [IU]
  Filled 2018-07-23: qty 5

## 2018-07-23 MED ORDER — HEPARIN SOD (PORK) LOCK FLUSH 100 UNIT/ML IV SOLN: 500.0000 [IU] | Freq: Once | INTRAVENOUS | Status: DC

## 2018-07-23 MED ORDER — IOHEXOL 300 MG/ML  SOLN
75.0000 mL | Freq: Once | INTRAMUSCULAR | Status: AC | PRN
  Administered 2018-07-23: 75 mL via INTRAVENOUS

## 2018-08-06 ENCOUNTER — Inpatient Hospital Stay: Payer: Medicare Other | Attending: Internal Medicine | Admitting: Internal Medicine

## 2018-08-06 ENCOUNTER — Encounter: Payer: Self-pay | Admitting: Internal Medicine

## 2018-08-06 ENCOUNTER — Telehealth: Payer: Self-pay | Admitting: Internal Medicine

## 2018-08-06 DIAGNOSIS — C3491 Malignant neoplasm of unspecified part of right bronchus or lung: Secondary | ICD-10-CM

## 2018-08-06 DIAGNOSIS — E041 Nontoxic single thyroid nodule: Secondary | ICD-10-CM | POA: Diagnosis not present

## 2018-08-06 DIAGNOSIS — Z9221 Personal history of antineoplastic chemotherapy: Secondary | ICD-10-CM | POA: Diagnosis not present

## 2018-08-06 DIAGNOSIS — C3431 Malignant neoplasm of lower lobe, right bronchus or lung: Secondary | ICD-10-CM

## 2018-08-06 DIAGNOSIS — Z923 Personal history of irradiation: Secondary | ICD-10-CM | POA: Insufficient documentation

## 2018-08-06 DIAGNOSIS — Z95828 Presence of other vascular implants and grafts: Secondary | ICD-10-CM

## 2018-08-06 DIAGNOSIS — R05 Cough: Secondary | ICD-10-CM | POA: Insufficient documentation

## 2018-08-06 DIAGNOSIS — Z5111 Encounter for antineoplastic chemotherapy: Secondary | ICD-10-CM

## 2018-08-06 MED ORDER — HYDROCOD POLST-CPM POLST ER 10-8 MG/5ML PO SUER: ORAL | 0 refills | Status: DC

## 2018-08-06 NOTE — Telephone Encounter (Signed)
Gave pt avs and calendar  °

## 2018-08-06 NOTE — Progress Notes (Signed)
[place] [phone]   [phone]  OFFICE PROGRESS NOTE  Golden Circle, FNP [address] [place] [address]  PRINCIPAL DIAGNOSIS: Local recurrence of non-small cell lung cancer, adenocarcinoma, initially diagnosed as stage IB (T2a N0 M0) in March of 2010.   PRIOR THERAPY:  1. Status post right lower lobectomy under the care of Dr. Cyndia Bent on 04/08/2009. 2. Status post palliative radiotherapy to the right lower lobe recurrent lung mass under the care of Dr. Lisbeth Renshaw, completed on June 08, 2011. 3. Systemic chemotherapy with carboplatin for AUC of 5 and Alimta 500 mg/M2 every 3 weeks. She is status post 3 cycles.  CURRENT THERAPY: Tarceva 150 mg by mouth daily, therapy beginning 07/24/2012. Status post approximately 63 months of therapy.   INTERVAL HISTORY: [name] 57 y.o. female returns to the clinic today for follow-up visit.  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  The patient is feeling fine today with no concerning complaints except for the dry cough improved with Tussionex.  She denied having any chest pain, shortness of breath but continues to have dry cough with no hemoptysis.  She has no significant weight loss or night sweats.  She has no nausea, vomiting, diarrhea or constipation.  She denied having any fever or chills.  She denied having any skin rash.  She continues to tolerate her treatment with Tarceva fairly well.  She had repeat CT scan of the chest performed recently and she is here for evaluation and discussion of her risk her results.  MEDICAL HISTORY: Past Medical History:  Diagnosis Date  . Drug-induced skin rash 02/27/2017  . Encounter for antineoplastic chemotherapy 01/19/2016  . History of radiation therapy 05/02/11 to 06/08/11   right lung  . Lung cancer ([place])    right lower lobe adenocarcinoma    ALLERGIES:  is allergic to codeine and doxycycline.  MEDICATIONS:  Current Outpatient  Medications  Medication Sig Dispense Refill  . acetaminophen (TYLENOL) 500 MG tablet Take 500 mg every 6 (six) hours as needed by mouth.    Marland Kitchen albuterol (PROAIR HFA) 108 (90 Base) MCG/ACT inhaler Inhale 1-2 puffs into the lungs every 6 (six) hours as needed for wheezing or shortness of breath. 1 Inhaler 2  . chlorpheniramine-HYDROcodone (TUSSIONEX) 10-8 MG/5ML SUER TAKE 5 MLS BY MOUTH TWICE DAILY 280 mL 0  . clindamycin (CLEOCIN T) 1 % lotion APPLY TO AFFECTED AREA TWICE A DAY 60 mL 0  . diltiazem (CARDIZEM CD) 120 MG 24 hr capsule Take 1 capsule (120 mg total) by mouth daily. 90 capsule 4  . erlotinib (TARCEVA) 150 MG tablet Take 1 tablet ( 150 mg total )  by mouth daily 30 tablet 10  . lidocaine-prilocaine (EMLA) cream Apply 1 application topically as needed. 30 g 0   No current facility-administered medications for this visit.     SURGICAL HISTORY:  Past Surgical History:  Procedure Laterality Date  . LUNG LOBECTOMY  04/18/2009   RLL  . PORTACATH PLACEMENT  02/29/2012   Procedure: INSERTION PORT-A-CATH;  Surgeon: Nicanor Alcon, MD;  Location: [place];  Service: Thoracic;  Laterality: Left;  PowerPort 8 F attachable in left internal jugular.    REVIEW OF SYSTEMS:  Constitutional: negative Eyes: negative Ears, nose, mouth, throat, and face: negative Respiratory: positive for cough Cardiovascular: negative Gastrointestinal: negative Genitourinary:negative Integument/breast: negative Hematologic/lymphatic: negative Musculoskeletal:negative Neurological: negative Behavioral/Psych: negative Endocrine: negative Allergic/Immunologic: negative   PHYSICAL EXAMINATION: General appearance:  alert, cooperative and no distress Head: Normocephalic, without obvious abnormality, atraumatic Neck: no adenopathy, no JVD, supple, symmetrical, trachea midline and thyroid not enlarged, symmetric, no tenderness/mass/nodules Lymph nodes: Cervical, supraclavicular, and axillary nodes normal. Resp: clear  to auscultation bilaterally Back: symmetric, no curvature. ROM normal. No CVA tenderness. Cardio: regular rate and rhythm, S1, S2 normal, no murmur, click, rub or gallop GI: soft, non-tender; bowel sounds normal; no masses,  no organomegaly Extremities: extremities normal, atraumatic, no cyanosis or edema Neurologic: Alert and oriented X 3, normal strength and tone. Normal symmetric reflexes. Normal coordination and gait  ECOG PERFORMANCE STATUS: 1 - Symptomatic but completely ambulatory  Blood pressure 123/88, pulse (!) 102, temperature 98.2 F (36.8 C), temperature source Oral, resp. rate 18, height 4\' 9"  (1.448 m), weight 125 lb 11.2 oz (57 kg), last menstrual period 02/10/2012, SpO2 98 %.  LABORATORY DATA: Lab Results  Component Value Date   WBC 7.1 07/23/2018   HGB 13.5 07/23/2018   HCT 40.7 07/23/2018   MCV 85.8 07/23/2018   PLT 331 07/23/2018      Chemistry      Component Value Date/Time   NA 142 07/23/2018 0926   NA 143 10/30/2017 1031   K 4.7 07/23/2018 0926   K 4.3 10/30/2017 1031   CL 104 07/23/2018 0926   CL 102 04/30/2013 1002   CO2 30 07/23/2018 0926   CO2 29 10/30/2017 1031   BUN 11 07/23/2018 0926   BUN 14.6 10/30/2017 1031   CREATININE 0.69 07/23/2018 0926   CREATININE 0.7 10/30/2017 1031      Component Value Date/Time   CALCIUM 9.8 07/23/2018 0926   CALCIUM 9.2 10/30/2017 1031   ALKPHOS 108 07/23/2018 0926   ALKPHOS 103 10/30/2017 1031   AST 29 07/23/2018 0926   AST 20 10/30/2017 1031   ALT 26 07/23/2018 0926   ALT 14 10/30/2017 1031   BILITOT 0.8 07/23/2018 0926   BILITOT 0.79 10/30/2017 1031       RADIOGRAPHIC STUDIES: Ct Chest W Contrast  Result Date: 07/23/2018 CLINICAL DATA:  Right lung cancer. EXAM: CT CHEST WITH CONTRAST TECHNIQUE: Multidetector CT imaging of the chest was performed during intravenous contrast administration. CONTRAST:  89mL OMNIPAQUE IOHEXOL 300 MG/ML  SOLN COMPARISON:  03/09/2018. FINDINGS: Cardiovascular: The heart  size appears normal. There is mild aortic atherosclerosis. No pericardial effusion. Calcification in the LAD and RCA coronary artery noted Mediastinum/Nodes: 1 cm nodule in right lobe of thyroid gland is noted. The trachea appears patent and is midline. Normal appearance of the esophagus. No enlarged axillary or supraclavicular lymph nodes. No mediastinal or hilar adenopathy identified. Right CP angle node is stable measuring 6 mm. Lungs/Pleura: Postoperative changes within the right hemithorax status post right lower lobectomy noted. Right pleural effusion which is partially loculated appears similar in volume to the previous exam. Overlying pleural thickening is identified and also appears stable. Paramediastinal fibrosis and masslike architectural distortion within the right lung is noted compatible with changes of external beam radiation. Hyperdense masslike architectural distortion within the posterior right lower lung measures 2.5 x 3.3 cm, image 76/2. Stable from previous exam. Central right lung nodule measuring 5 mm is unchanged, image 57/5. Adjacent small solid nodule measuring 2 mm is also unchanged, image 56/5. Irregular and elongated nodular density within the medial right upper lung is stable measuring 1.4 x 0.6 cm. Left lung is clear. Upper Abdomen: The adrenal glands appear normal. No acute findings are identified within the visualized portions of the upper abdomen.  Musculoskeletal: Chronic right posterior rib deformities identified which may be treatment related. No suspicious bone lesions identified. Old healed left clavicle fracture. IMPRESSION: 1. Stable appearance of the chest. Post treatment changes within the right lung are unchanged. Hyperdense masslike density in the posterior right lower lung is also unchanged. The small nodules within the right lung are stable. 2. Unchanged chronic right pleural effusion. 3.  Aortic Atherosclerosis (ICD10-I70.0). 4. Coronary artery atherosclerotic  calcifications Electronically Signed   By: Kerby Moors M.D.   On: 07/23/2018 14:57    ASSESSMENT AND PLAN:  This is a very pleasant 57 years old Asian female with recurrent non-small cell lung cancer, adenocarcinoma status post surgical resection followed by systemic chemotherapy with carboplatin and Alimta followed by disease progression and the patient was started on treatment with Tarceva 150 mg by mouth daily status post 63 months of treatment. She continues to tolerate the treatment well with no concerning adverse effect except for mild inflammation in the eye brows.  It is usually resolved with clindamycin lotion. The patient had a repeat CT scan of the chest performed recently.  I personally and independently reviewed the scans and discussed the results with the patient today. Her scan showed no concerning findings for disease progression. I recommended for the patient to continue her current treatment with Tarceva for now. I will see her back for follow-up visit in 6 weeks for evaluation with repeat blood work. For the dry cough, I gave the patient prescription refill for Tussionex. She was advised to call immediately if she has any concerning symptoms in the interval. All questions were answered. The patient knows to call the clinic with any problems, questions or concerns. We can certainly see the patient much sooner if necessary.  Disclaimer: This note was dictated with voice recognition software. Similar sounding words can inadvertently be transcribed and may not be corrected upon review.

## 2018-08-06 NOTE — Progress Notes (Signed)
No interpretor provided for pt today. Used [employer] video interpreting for pt visit.

## 2018-08-07 MED FILL — HYDROCODONE-CHLORPHEN ER SU: 10-8 | 28 days supply | Qty: 280 | Fill #0

## 2018-09-10 ENCOUNTER — Other Ambulatory Visit: Payer: Self-pay | Admitting: Oncology

## 2018-09-10 ENCOUNTER — Telehealth: Payer: Self-pay | Admitting: *Deleted

## 2018-09-10 ENCOUNTER — Telehealth: Payer: Self-pay | Admitting: Medical Oncology

## 2018-09-10 DIAGNOSIS — Z95828 Presence of other vascular implants and grafts: Secondary | ICD-10-CM

## 2018-09-10 DIAGNOSIS — C3431 Malignant neoplasm of lower lobe, right bronchus or lung: Secondary | ICD-10-CM

## 2018-09-10 DIAGNOSIS — C3491 Malignant neoplasm of unspecified part of right bronchus or lung: Secondary | ICD-10-CM

## 2018-09-10 DIAGNOSIS — Z5111 Encounter for antineoplastic chemotherapy: Secondary | ICD-10-CM

## 2018-09-10 MED ORDER — HYDROCOD POLST-CPM POLST ER 10-8 MG/5ML PO SUER: ORAL | 0 refills | Status: DC

## 2018-09-10 NOTE — Telephone Encounter (Signed)
Spoke with patient's daughter in the office today.  She is requesting a refill of her mother's Tussionex.  It was last filled on 08/06/18 @ [facility] on [address].  Mikey Bussing, NP made aware.

## 2018-09-10 NOTE — Telephone Encounter (Signed)
Requests  tussionex to go to [facility] . I called [facility] and cancelled rx today.

## 2018-09-10 NOTE — Telephone Encounter (Signed)
Prescription has been sent.

## 2018-09-10 NOTE — Addendum Note (Signed)
Addended by: Ardeen Garland on: 09/10/2018 04:05 PM   Modules accepted: Orders

## 2018-09-11 MED FILL — HYDROCODONE-CHLORPHEN ER SU: 10-8 | 28 days supply | Qty: 280 | Fill #0

## 2018-09-17 ENCOUNTER — Other Ambulatory Visit: Payer: Self-pay | Admitting: Obstetrics and Gynecology

## 2018-09-17 ENCOUNTER — Inpatient Hospital Stay: Payer: Medicare Other

## 2018-09-17 ENCOUNTER — Inpatient Hospital Stay: Payer: Medicare Other | Admitting: Internal Medicine

## 2018-09-17 DIAGNOSIS — Z13228 Encounter for screening for other metabolic disorders: Secondary | ICD-10-CM | POA: Diagnosis not present

## 2018-09-17 DIAGNOSIS — Z1231 Encounter for screening mammogram for malignant neoplasm of breast: Secondary | ICD-10-CM | POA: Diagnosis not present

## 2018-09-17 DIAGNOSIS — Z13220 Encounter for screening for lipoid disorders: Secondary | ICD-10-CM | POA: Diagnosis not present

## 2018-09-17 DIAGNOSIS — Z6825 Body mass index (BMI) 25.0-25.9, adult: Secondary | ICD-10-CM | POA: Diagnosis not present

## 2018-09-17 DIAGNOSIS — Z1321 Encounter for screening for nutritional disorder: Secondary | ICD-10-CM | POA: Diagnosis not present

## 2018-09-17 DIAGNOSIS — Z01419 Encounter for gynecological examination (general) (routine) without abnormal findings: Secondary | ICD-10-CM | POA: Diagnosis not present

## 2018-09-17 DIAGNOSIS — R1010 Upper abdominal pain, unspecified: Secondary | ICD-10-CM

## 2018-09-24 ENCOUNTER — Other Ambulatory Visit: Payer: Self-pay

## 2018-09-27 ENCOUNTER — Inpatient Hospital Stay (HOSPITAL_BASED_OUTPATIENT_CLINIC_OR_DEPARTMENT_OTHER): Payer: Medicare Other | Admitting: Internal Medicine

## 2018-09-27 ENCOUNTER — Inpatient Hospital Stay: Payer: Medicare Other | Attending: Internal Medicine

## 2018-09-27 ENCOUNTER — Telehealth: Payer: Self-pay

## 2018-09-27 ENCOUNTER — Encounter: Payer: Self-pay | Admitting: Internal Medicine

## 2018-09-27 DIAGNOSIS — Z95828 Presence of other vascular implants and grafts: Secondary | ICD-10-CM

## 2018-09-27 DIAGNOSIS — Z923 Personal history of irradiation: Secondary | ICD-10-CM | POA: Diagnosis not present

## 2018-09-27 DIAGNOSIS — C3490 Malignant neoplasm of unspecified part of unspecified bronchus or lung: Secondary | ICD-10-CM

## 2018-09-27 DIAGNOSIS — C3431 Malignant neoplasm of lower lobe, right bronchus or lung: Secondary | ICD-10-CM

## 2018-09-27 DIAGNOSIS — R05 Cough: Secondary | ICD-10-CM | POA: Insufficient documentation

## 2018-09-27 DIAGNOSIS — Z5111 Encounter for antineoplastic chemotherapy: Secondary | ICD-10-CM

## 2018-09-27 DIAGNOSIS — C3491 Malignant neoplasm of unspecified part of right bronchus or lung: Secondary | ICD-10-CM

## 2018-09-27 LAB — CBC WITH DIFFERENTIAL (CANCER CENTER ONLY)
Abs Immature Granulocytes: 0.01 10*3/uL (ref 0.00–0.07)
Basophils Absolute: 0 10*3/uL (ref 0.0–0.1)
Basophils Relative: 0 %
Eosinophils Absolute: 0.1 10*3/uL (ref 0.0–0.5)
Eosinophils Relative: 2 %
HCT: 42.3 % (ref 36.0–46.0)
Hemoglobin: 13.6 g/dL (ref 12.0–15.0)
Immature Granulocytes: 0 %
Lymphocytes Relative: 22 %
Lymphs Abs: 1.6 10*3/uL (ref 0.7–4.0)
MCH: 28.3 pg (ref 26.0–34.0)
MCHC: 32.2 g/dL (ref 30.0–36.0)
MCV: 87.9 fL (ref 80.0–100.0)
Monocytes Absolute: 0.4 10*3/uL (ref 0.1–1.0)
Monocytes Relative: 6 %
Neutro Abs: 5.1 10*3/uL (ref 1.7–7.7)
Neutrophils Relative %: 70 %
Platelet Count: 327 10*3/uL (ref 150–400)
RBC: 4.81 MIL/uL (ref 3.87–5.11)
RDW: 13.3 % (ref 11.5–15.5)
WBC Count: 7.3 10*3/uL (ref 4.0–10.5)
nRBC: 0 % (ref 0.0–0.2)

## 2018-09-27 LAB — CMP (CANCER CENTER ONLY)
ALT: 19 U/L (ref 0–44)
AST: 22 U/L (ref 15–41)
Albumin: 3.8 g/dL (ref 3.5–5.0)
Alkaline Phosphatase: 88 U/L (ref 38–126)
Anion gap: 10 (ref 5–15)
BUN: 11 mg/dL (ref 6–20)
CO2: 30 mmol/L (ref 22–32)
Calcium: 9.3 mg/dL (ref 8.9–10.3)
Chloride: 103 mmol/L (ref 98–111)
Creatinine: 0.72 mg/dL (ref 0.44–1.00)
GFR, Est AFR Am: >60 mL/min (ref >60)
GFR, Estimated: >60 mL/min (ref >60)
Glucose, Bld: 98 mg/dL (ref 70–99)
Potassium: 4.3 mmol/L (ref 3.5–5.1)
Sodium: 143 mmol/L (ref 135–145)
Total Bilirubin: 0.8 mg/dL (ref 0.3–1.2)
Total Protein: 7.6 g/dL (ref 6.5–8.1)

## 2018-09-27 MED ORDER — HYDROCOD POLST-CPM POLST ER 10-8 MG/5ML PO SUER: ORAL | 0 refills | Status: DC

## 2018-09-27 NOTE — Telephone Encounter (Signed)
Printed avs and calender of upcoming appointment. Per 10/31 los

## 2018-09-27 NOTE — Progress Notes (Signed)
[facility] [phone]   [phone]  OFFICE PROGRESS NOTE  Golden Circle, FNP [address] [place] [address]  PRINCIPAL DIAGNOSIS: Local recurrence of non-small cell lung cancer, adenocarcinoma, initially diagnosed as stage IB (T2a N0 M0) in March of 2010.   PRIOR THERAPY:  1. Status post right lower lobectomy under the care of Dr. Cyndia Bent on 04/08/2009. 2. Status post palliative radiotherapy to the right lower lobe recurrent lung mass under the care of Dr. Lisbeth Renshaw, completed on June 08, 2011. 3. Systemic chemotherapy with carboplatin for AUC of 5 and Alimta 500 mg/M2 every 3 weeks. She is status post 3 cycles.  CURRENT THERAPY: Tarceva 150 mg by mouth daily, therapy beginning 07/24/2012. Status post approximately 75 months of therapy.   INTERVAL HISTORY: [name] 57 y.o. female returns to the clinic today for follow-up visit accompanied by her [place] interpreter.  The patient is feeling fine today with no specific complaints except for the dry cough and rash in her eyebrows. She denied having any chest pain, shortness breath, cough or hemoptysis.  She denied having any fever or chills.  She has no nausea, vomiting, diarrhea or constipation.  She continues to tolerate her treatment with Tarceva fairly well.  The patient is here today for evaluation and repeat blood work.  MEDICAL HISTORY: Past Medical History:  Diagnosis Date  . Drug-induced skin rash 02/27/2017  . Encounter for antineoplastic chemotherapy 01/19/2016  . History of radiation therapy 05/02/11 to 06/08/11   right lung  . Lung cancer ([place])    right lower lobe adenocarcinoma    ALLERGIES:  is allergic to codeine and doxycycline.  MEDICATIONS:  Current Outpatient Medications  Medication Sig Dispense Refill  . acetaminophen (TYLENOL) 500 MG tablet Take 500 mg every 6 (six) hours as needed by mouth.    Marland Kitchen albuterol (PROAIR HFA) 108 (90 Base) MCG/ACT inhaler  Inhale 1-2 puffs into the lungs every 6 (six) hours as needed for wheezing or shortness of breath. 1 Inhaler 2  . chlorpheniramine-HYDROcodone (TUSSIONEX) 10-8 MG/5ML SUER TAKE 5 MLS BY MOUTH TWICE DAILY 280 mL 0  . clindamycin (CLEOCIN T) 1 % lotion APPLY TO AFFECTED AREA TWICE A DAY 60 mL 0  . diltiazem (CARDIZEM CD) 120 MG 24 hr capsule Take 1 capsule (120 mg total) by mouth daily. 90 capsule 4  . erlotinib (TARCEVA) 150 MG tablet Take 1 tablet ( 150 mg total )  by mouth daily 30 tablet 10  . lidocaine-prilocaine (EMLA) cream Apply 1 application topically as needed. 30 g 0   No current facility-administered medications for this visit.     SURGICAL HISTORY:  Past Surgical History:  Procedure Laterality Date  . LUNG LOBECTOMY  04/18/2009   RLL  . PORTACATH PLACEMENT  02/29/2012   Procedure: INSERTION PORT-A-CATH;  Surgeon: Nicanor Alcon, MD;  Location: [facility];  Service: Thoracic;  Laterality: Left;  PowerPort 8 F attachable in left internal jugular.    REVIEW OF SYSTEMS:  A comprehensive review of systems was negative except for: Respiratory: positive for cough Integument/breast: positive for rash   PHYSICAL EXAMINATION: General appearance: alert, cooperative and no distress Head: Normocephalic, without obvious abnormality, atraumatic Neck: no adenopathy, no JVD, supple, symmetrical, trachea midline and thyroid not enlarged, symmetric, no tenderness/mass/nodules Lymph nodes: Cervical, supraclavicular, and axillary nodes normal. Resp: clear to auscultation bilaterally Back: symmetric, no curvature. ROM normal. No CVA tenderness. Cardio: regular rate and rhythm,  S1, S2 normal, no murmur, click, rub or gallop GI: soft, non-tender; bowel sounds normal; no masses,  no organomegaly Extremities: extremities normal, atraumatic, no cyanosis or edema  ECOG PERFORMANCE STATUS: 1 - Symptomatic but completely ambulatory  Blood pressure (!) 125/93, pulse (!) 101, temperature 98.4 F (36.9 C),  temperature source Oral, resp. rate 17, height 4\' 9"  (1.448 m), weight 127 lb (57.6 kg), last menstrual period 02/10/2012, SpO2 97 %.  LABORATORY DATA: Lab Results  Component Value Date   WBC 7.3 09/27/2018   HGB 13.6 09/27/2018   HCT 42.3 09/27/2018   MCV 87.9 09/27/2018   PLT 327 09/27/2018      Chemistry      Component Value Date/Time   NA 143 09/27/2018 0918   NA 143 10/30/2017 1031   K 4.3 09/27/2018 0918   K 4.3 10/30/2017 1031   CL 103 09/27/2018 0918   CL 102 04/30/2013 1002   CO2 30 09/27/2018 0918   CO2 29 10/30/2017 1031   BUN 11 09/27/2018 0918   BUN 14.6 10/30/2017 1031   CREATININE 0.72 09/27/2018 0918   CREATININE 0.7 10/30/2017 1031      Component Value Date/Time   CALCIUM 9.3 09/27/2018 0918   CALCIUM 9.2 10/30/2017 1031   ALKPHOS 88 09/27/2018 0918   ALKPHOS 103 10/30/2017 1031   AST 22 09/27/2018 0918   AST 20 10/30/2017 1031   ALT 19 09/27/2018 0918   ALT 14 10/30/2017 1031   BILITOT 0.8 09/27/2018 0918   BILITOT 0.79 10/30/2017 1031       RADIOGRAPHIC STUDIES: No results found.  ASSESSMENT AND PLAN:  This is a very pleasant 57 years old Asian female with recurrent non-small cell lung cancer, adenocarcinoma status post surgical resection followed by systemic chemotherapy with carboplatin and Alimta followed by disease progression and the patient was started on treatment with Tarceva 150 mg by mouth daily status post 75 months of treatment. She continues to tolerate this treatment well with no concerning adverse effects.  I recommended for her to continue her current treatment with Tarceva with the same dose. I will see her back for follow-up visit in 6 weeks for evaluation after repeating CT scan of the chest for restaging of her disease. For the dry cough, I gave her a refill of Tussionex today. She was advised to call immediately if she has any concerning symptoms in the interval. All questions were answered. The patient knows to call the  clinic with any problems, questions or concerns. We can certainly see the patient much sooner if necessary.  Disclaimer: This note was dictated with voice recognition software. Similar sounding words can inadvertently be transcribed and may not be corrected upon review.

## 2018-10-01 ENCOUNTER — Ambulatory Visit
Admission: RE | Admit: 2018-10-01 | Discharge: 2018-10-01 | Disposition: A | Discharge status: Home / Self Care | Payer: Medicare Other | Source: Ambulatory Visit | Attending: Obstetrics and Gynecology | Admitting: Obstetrics and Gynecology

## 2018-10-01 DIAGNOSIS — M8588 Other specified disorders of bone density and structure, other site: Secondary | ICD-10-CM | POA: Diagnosis not present

## 2018-10-01 DIAGNOSIS — R109 Unspecified abdominal pain: Secondary | ICD-10-CM | POA: Diagnosis not present

## 2018-10-01 DIAGNOSIS — R1010 Upper abdominal pain, unspecified: Secondary | ICD-10-CM

## 2018-10-01 DIAGNOSIS — N958 Other specified menopausal and perimenopausal disorders: Secondary | ICD-10-CM | POA: Diagnosis not present

## 2018-10-19 MED FILL — HYDROCODONE-CHLORPHEN ER SU: 10-8 | 28 days supply | Qty: 280 | Fill #0

## 2018-11-05 ENCOUNTER — Inpatient Hospital Stay: Payer: Medicare Other | Attending: Internal Medicine

## 2018-11-05 ENCOUNTER — Encounter (HOSPITAL_COMMUNITY): Payer: Self-pay

## 2018-11-05 ENCOUNTER — Ambulatory Visit (HOSPITAL_COMMUNITY)
Admission: RE | Admit: 2018-11-05 | Discharge: 2018-11-05 | Disposition: A | Discharge status: Home / Self Care | Payer: Medicare Other | Source: Ambulatory Visit | Attending: Internal Medicine | Admitting: Internal Medicine

## 2018-11-05 DIAGNOSIS — Z9221 Personal history of antineoplastic chemotherapy: Secondary | ICD-10-CM | POA: Insufficient documentation

## 2018-11-05 DIAGNOSIS — C3490 Malignant neoplasm of unspecified part of unspecified bronchus or lung: Secondary | ICD-10-CM | POA: Insufficient documentation

## 2018-11-05 DIAGNOSIS — C3431 Malignant neoplasm of lower lobe, right bronchus or lung: Secondary | ICD-10-CM | POA: Insufficient documentation

## 2018-11-05 DIAGNOSIS — Z79899 Other long term (current) drug therapy: Secondary | ICD-10-CM | POA: Insufficient documentation

## 2018-11-05 DIAGNOSIS — Z923 Personal history of irradiation: Secondary | ICD-10-CM | POA: Insufficient documentation

## 2018-11-05 LAB — CMP (CANCER CENTER ONLY)
ALT: 15 U/L (ref 0–44)
AST: 19 U/L (ref 15–41)
Albumin: 3.9 g/dL (ref 3.5–5.0)
Alkaline Phosphatase: 92 U/L (ref 38–126)
Anion gap: 11 (ref 5–15)
BUN: 13 mg/dL (ref 6–20)
CO2: 28 mmol/L (ref 22–32)
Calcium: 9.2 mg/dL (ref 8.9–10.3)
Chloride: 103 mmol/L (ref 98–111)
Creatinine: 0.71 mg/dL (ref 0.44–1.00)
GFR, Est AFR Am: >60 mL/min (ref >60)
GFR, Estimated: >60 mL/min (ref >60)
Glucose, Bld: 90 mg/dL (ref 70–99)
Potassium: 4.5 mmol/L (ref 3.5–5.1)
Sodium: 142 mmol/L (ref 135–145)
Total Bilirubin: 0.7 mg/dL (ref 0.3–1.2)
Total Protein: 7.6 g/dL (ref 6.5–8.1)

## 2018-11-05 LAB — CBC WITH DIFFERENTIAL (CANCER CENTER ONLY)
Abs Immature Granulocytes: 0.04 10*3/uL (ref 0.00–0.07)
Basophils Absolute: 0 10*3/uL (ref 0.0–0.1)
Basophils Relative: 0 %
Eosinophils Absolute: 0.1 10*3/uL (ref 0.0–0.5)
Eosinophils Relative: 1 %
HCT: 40.9 % (ref 36.0–46.0)
Hemoglobin: 13.2 g/dL (ref 12.0–15.0)
Immature Granulocytes: 1 %
Lymphocytes Relative: 20 %
Lymphs Abs: 1.7 10*3/uL (ref 0.7–4.0)
MCH: 28.2 pg (ref 26.0–34.0)
MCHC: 32.3 g/dL (ref 30.0–36.0)
MCV: 87.4 fL (ref 80.0–100.0)
Monocytes Absolute: 0.5 10*3/uL (ref 0.1–1.0)
Monocytes Relative: 6 %
Neutro Abs: 6.1 10*3/uL (ref 1.7–7.7)
Neutrophils Relative %: 72 %
Platelet Count: 308 10*3/uL (ref 150–400)
RBC: 4.68 MIL/uL (ref 3.87–5.11)
RDW: 13.2 % (ref 11.5–15.5)
WBC Count: 8.5 10*3/uL (ref 4.0–10.5)
nRBC: 0 % (ref 0.0–0.2)

## 2018-11-05 MED ORDER — SODIUM CHLORIDE (PF) 0.9 % IJ SOLN
INTRAMUSCULAR | Status: AC
  Filled 2018-11-05: qty 50

## 2018-11-06 ENCOUNTER — Telehealth: Payer: Self-pay | Admitting: Pharmacist

## 2018-11-06 DIAGNOSIS — C3491 Malignant neoplasm of unspecified part of right bronchus or lung: Secondary | ICD-10-CM

## 2018-11-06 DIAGNOSIS — C3431 Malignant neoplasm of lower lobe, right bronchus or lung: Secondary | ICD-10-CM

## 2018-11-06 DIAGNOSIS — Z95828 Presence of other vascular implants and grafts: Secondary | ICD-10-CM

## 2018-11-06 MED ORDER — ERLOTINIB HCL 150 MG PO TABS: 150.0000 mg | ORAL_TABLET | Freq: Every day | ORAL | 11 refills | Status: DC

## 2018-11-06 NOTE — Telephone Encounter (Signed)
Oral Oncology Pharmacist Encounter  Received request from [facility], which is the contracted mail order pharmacy for [facility] patient assistance foundation, for new Tarceva prescription. I returned call to pharmacy at [phone] to provide verbal prescription for Tarceva.  Patient appears to be stable on Tarceva per September 27, 2018 office note from Dr. Julien Nordmann.  Verbal prescription for Tarceva 150 mg tablets, take 1 tablet by mouth once daily, taken on an empty stomach 1 hour before or 2 hours after food, provided to pharmacy. Quantity #30 Refills = [address], PharmD, [place], [place]  11/06/2018   9:45 AM Oral Oncology Clinic [phone]

## 2018-11-08 ENCOUNTER — Ambulatory Visit (HOSPITAL_COMMUNITY)
Admission: RE | Admit: 2018-11-08 | Discharge: 2018-11-08 | Disposition: A | Discharge status: Home / Self Care | Payer: Medicare Other | Source: Ambulatory Visit | Attending: Internal Medicine | Admitting: Internal Medicine

## 2018-11-08 ENCOUNTER — Encounter (HOSPITAL_COMMUNITY): Payer: Self-pay

## 2018-11-08 DIAGNOSIS — C3490 Malignant neoplasm of unspecified part of unspecified bronchus or lung: Secondary | ICD-10-CM | POA: Insufficient documentation

## 2018-11-08 DIAGNOSIS — Z5111 Encounter for antineoplastic chemotherapy: Secondary | ICD-10-CM | POA: Diagnosis not present

## 2018-11-08 MED ORDER — SODIUM CHLORIDE (PF) 0.9 % IJ SOLN
INTRAMUSCULAR | Status: AC
  Filled 2018-11-08: qty 50

## 2018-11-08 MED ORDER — IOHEXOL 300 MG/ML  SOLN
75.0000 mL | Freq: Once | INTRAMUSCULAR | Status: AC | PRN
  Administered 2018-11-08: 75 mL via INTRAVENOUS

## 2018-11-12 ENCOUNTER — Telehealth: Payer: Self-pay

## 2018-11-12 ENCOUNTER — Encounter: Payer: Self-pay | Admitting: Internal Medicine

## 2018-11-12 ENCOUNTER — Inpatient Hospital Stay (HOSPITAL_BASED_OUTPATIENT_CLINIC_OR_DEPARTMENT_OTHER): Payer: Medicare Other | Admitting: Internal Medicine

## 2018-11-12 DIAGNOSIS — C3431 Malignant neoplasm of lower lobe, right bronchus or lung: Secondary | ICD-10-CM | POA: Diagnosis not present

## 2018-11-12 DIAGNOSIS — Z95828 Presence of other vascular implants and grafts: Secondary | ICD-10-CM

## 2018-11-12 DIAGNOSIS — C3491 Malignant neoplasm of unspecified part of right bronchus or lung: Secondary | ICD-10-CM

## 2018-11-12 DIAGNOSIS — Z9221 Personal history of antineoplastic chemotherapy: Secondary | ICD-10-CM | POA: Diagnosis not present

## 2018-11-12 DIAGNOSIS — Z79899 Other long term (current) drug therapy: Secondary | ICD-10-CM

## 2018-11-12 DIAGNOSIS — Z923 Personal history of irradiation: Secondary | ICD-10-CM

## 2018-11-12 DIAGNOSIS — Z5111 Encounter for antineoplastic chemotherapy: Secondary | ICD-10-CM

## 2018-11-12 MED ORDER — HYDROCOD POLST-CPM POLST ER 10-8 MG/5ML PO SUER: ORAL | 0 refills | Status: DC

## 2018-11-12 MED ORDER — CLINDAMYCIN PHOSPHATE 1 % EX LOTN: TOPICAL_LOTION | CUTANEOUS | 0 refills | Status: DC

## 2018-11-12 NOTE — Telephone Encounter (Signed)
Printed avs and calender of upcoming appointment. Per 12/16 los

## 2018-11-12 NOTE — Progress Notes (Signed)
[facility] [phone]   Fax:(336) [phone]  OFFICE PROGRESS NOTE  Golden Circle, FNP [address] [place] [address]  PRINCIPAL DIAGNOSIS: Local recurrence of non-small cell lung cancer, adenocarcinoma, initially diagnosed as stage IB (T2a N0 M0) in March of 2010.   PRIOR THERAPY:  1. Status post right lower lobectomy under the care of Dr. Cyndia Bent on 04/08/2009. 2. Status post palliative radiotherapy to the right lower lobe recurrent lung mass under the care of Dr. Lisbeth Renshaw, completed on June 08, 2011. 3. Systemic chemotherapy with carboplatin for AUC of 5 and Alimta 500 mg/M2 every 3 weeks. She is status post 3 cycles.  CURRENT THERAPY: Tarceva 150 mg by mouth daily, therapy beginning 07/24/2012. Status post approximately 76 months of therapy.   INTERVAL HISTORY: [name] 57 y.o. female  returns to the clinic today for follow-up visit accompanied by her [place] interpreter.  The patient is feeling fine today with no concerning complaints except for dry skin.  She denied having any chest pain, shortness of breath, but has dry cough with no hemoptysis.  She denied having any recent weight loss or night sweats.  She has no nausea, vomiting, diarrhea or constipation.  She has no headache or visual changes.  She continues to tolerate her treatment with Tarceva fairly well except for the dry skin as well as skin rash on the face and eyebrows.  She had repeat CT scan of the chest performed recently and she is here for evaluation and discussion of her scan results.  MEDICAL HISTORY: Past Medical History:  Diagnosis Date  . Drug-induced skin rash 02/27/2017  . Encounter for antineoplastic chemotherapy 01/19/2016  . History of radiation therapy 05/02/11 to 06/08/11   right lung  . Lung cancer ([facility])    right lower lobe adenocarcinoma    ALLERGIES:  is allergic to codeine and doxycycline.  MEDICATIONS:  Current Outpatient Medications    Medication Sig Dispense Refill  . acetaminophen (TYLENOL) 500 MG tablet Take 500 mg every 6 (six) hours as needed by mouth.    Marland Kitchen albuterol (PROAIR HFA) 108 (90 Base) MCG/ACT inhaler Inhale 1-2 puffs into the lungs every 6 (six) hours as needed for wheezing or shortness of breath. 1 Inhaler 2  . chlorpheniramine-HYDROcodone (TUSSIONEX) 10-8 MG/5ML SUER TAKE 5 MLS BY MOUTH TWICE DAILY 280 mL 0  . clindamycin (CLEOCIN T) 1 % lotion APPLY TO AFFECTED AREA TWICE A DAY 60 mL 0  . diltiazem (CARDIZEM CD) 120 MG 24 hr capsule Take 1 capsule (120 mg total) by mouth daily. 90 capsule 4  . erlotinib (TARCEVA) 150 MG tablet Take 1 tablet (150 mg total) by mouth daily. Take on an empty stomach at least 1 hour before or 2 hours after food. 30 tablet 11  . lidocaine-prilocaine (EMLA) cream Apply 1 application topically as needed. 30 g 0   No current facility-administered medications for this visit.     SURGICAL HISTORY:  Past Surgical History:  Procedure Laterality Date  . LUNG LOBECTOMY  04/18/2009   RLL  . PORTACATH PLACEMENT  02/29/2012   Procedure: INSERTION PORT-A-CATH;  Surgeon: Nicanor Alcon, MD;  Location: [facility];  Service: Thoracic;  Laterality: Left;  PowerPort 8 F attachable in left internal jugular.    REVIEW OF SYSTEMS:  Constitutional: positive for fatigue Eyes: negative Ears, nose, mouth, throat, and face: negative Respiratory: positive for cough Cardiovascular: negative Gastrointestinal: negative Genitourinary:negative Integument/breast: positive for rash  Hematologic/lymphatic: negative Musculoskeletal:negative Neurological: negative Behavioral/Psych: negative Endocrine: negative Allergic/Immunologic: negative   PHYSICAL EXAMINATION: General appearance: alert, cooperative and no distress Head: Normocephalic, without obvious abnormality, atraumatic Neck: no adenopathy, no JVD, supple, symmetrical, trachea midline and thyroid not enlarged, symmetric, no  tenderness/mass/nodules Lymph nodes: Cervical, supraclavicular, and axillary nodes normal. Resp: clear to auscultation bilaterally Back: symmetric, no curvature. ROM normal. No CVA tenderness. Cardio: regular rate and rhythm, S1, S2 normal, no murmur, click, rub or gallop GI: soft, non-tender; bowel sounds normal; no masses,  no organomegaly Extremities: extremities normal, atraumatic, no cyanosis or edema Neurologic: Alert and oriented X 3, normal strength and tone. Normal symmetric reflexes. Normal coordination and gait  ECOG PERFORMANCE STATUS: 1 - Symptomatic but completely ambulatory  Blood pressure (!) 128/91, pulse (!) 106, temperature 98.5 F (36.9 C), temperature source Oral, resp. rate 17, weight 126 lb 4.8 oz (57.3 kg), last menstrual period 02/10/2012, SpO2 97 %.  LABORATORY DATA: Lab Results  Component Value Date   WBC 8.5 11/05/2018   HGB 13.2 11/05/2018   HCT 40.9 11/05/2018   MCV 87.4 11/05/2018   PLT 308 11/05/2018      Chemistry      Component Value Date/Time   NA 142 11/05/2018 1109   NA 143 10/30/2017 1031   K 4.5 11/05/2018 1109   K 4.3 10/30/2017 1031   CL 103 11/05/2018 1109   CL 102 04/30/2013 1002   CO2 28 11/05/2018 1109   CO2 29 10/30/2017 1031   BUN 13 11/05/2018 1109   BUN 14.6 10/30/2017 1031   CREATININE 0.71 11/05/2018 1109   CREATININE 0.7 10/30/2017 1031      Component Value Date/Time   CALCIUM 9.2 11/05/2018 1109   CALCIUM 9.2 10/30/2017 1031   ALKPHOS 92 11/05/2018 1109   ALKPHOS 103 10/30/2017 1031   AST 19 11/05/2018 1109   AST 20 10/30/2017 1031   ALT 15 11/05/2018 1109   ALT 14 10/30/2017 1031   BILITOT 0.7 11/05/2018 1109   BILITOT 0.79 10/30/2017 1031       RADIOGRAPHIC STUDIES: Ct Chest W Contrast  Result Date: 11/08/2018 CLINICAL DATA:  Lung cancer, oral chemotherapy, dry cough. EXAM: CT CHEST WITH CONTRAST TECHNIQUE: Multidetector CT imaging of the chest was performed during intravenous contrast administration.  CONTRAST:  80mL OMNIPAQUE IOHEXOL 300 MG/ML  SOLN COMPARISON:  07/23/2018. FINDINGS: Cardiovascular: Left IJ Port-A-Cath terminates at the SVC RA junction. Atherosclerotic calcification of the aorta. Heart is enlarged. No pericardial effusion. Mediastinum/Nodes: No pathologically enlarged mediastinal, hilar or axillary lymph nodes. Esophagus is grossly unremarkable. Lungs/Pleura: Irregular nodular consolidation in the apical segment right upper lobe is unchanged. Right lower lobectomy. Heterogeneous consolidation in the posterior lower right hemithorax measures approximately 2.6 x 3.6 cm, stable. There is a rind of pleural fluid and thickening in the lower right hemithorax, also unchanged. Vague nodules in the right upper lobe (series 5, image 52), stable. Left lung is clear. No left pleural fluid. Airway is otherwise unremarkable. Upper Abdomen: Visualized portion of the liver appears decreased in attenuation diffusely. Visualized portions of the adrenal glands, kidneys, spleen, pancreas, stomach and bowel are grossly unremarkable. No upper abdominal adenopathy. Musculoskeletal: Thoracotomy changes on the right. Old left clavicle fracture. No definite worrisome lytic or sclerotic lesions. IMPRESSION: 1. Stable post treatment changes, consolidation/nodularity and associated rind of pleural fluid/thickening in the right hemithorax. No evidence of recurrent or metastatic disease. 2. Liver appears steatotic. 3.  Aortic atherosclerosis (ICD10-170.0). Electronically Signed   By: Lorin Picket  M.D.   On: 11/08/2018 15:42    ASSESSMENT AND PLAN:  This is a very pleasant 57 years old Asian female with recurrent non-small cell lung cancer, adenocarcinoma status post surgical resection followed by systemic chemotherapy with carboplatin and Alimta followed by disease progression and the patient was started on treatment with Tarceva 150 mg by mouth daily status post 76 months of treatment. The patient continues to  tolerate this treatment well with no concerning adverse effects except for dry skin and skin rash on the face. She had repeat CT scan of the chest performed recently.  I personally and independently reviewed the scan and discussed the results with the patient today. Her scan showed no concerning findings for disease progression. I recommended for the patient to continue her current treatment with Tarceva with the same dose. I will see her back for follow-up visit in 6 weeks for evaluation and repeat blood work. For the dry cough, I gave her a refill of Tussionex. For the skin rash, I gave her a refill of clindamycin lotion. The patient was advised to call immediately if she has any concerning symptoms in the interval. All questions were answered. The patient knows to call the clinic with any problems, questions or concerns. We can certainly see the patient much sooner if necessary.  Disclaimer: This note was dictated with voice recognition software. Similar sounding words can inadvertently be transcribed and may not be corrected upon review.

## 2018-11-14 MED FILL — HYDROCODONE-CHLORPHEN ER SU: 10-8 | 28 days supply | Qty: 280 | Fill #0

## 2018-11-15 ENCOUNTER — Other Ambulatory Visit: Payer: Self-pay | Admitting: Physician Assistant

## 2018-11-15 ENCOUNTER — Encounter: Payer: Self-pay | Admitting: Pharmacist

## 2018-11-15 MED FILL — PEG-3350 SOLUTION: 420 | 2 days supply | Qty: 4000 | Fill #0

## 2018-11-15 NOTE — Progress Notes (Signed)
Oral Oncology Pharmacist Encounter  Received check-in documentation from [facility] with request to verify income and insurance status. Document completed. Document faxed to [phone]. Received successful transaction notice.  Johny Drilling, PharmD, BCPS, BCOP  11/15/2018 2:56 PM Oral Oncology Clinic [phone]

## 2018-11-16 NOTE — Telephone Encounter (Signed)
Please have her make an appointment, one refill until seen again please  Renee

## 2018-12-04 ENCOUNTER — Encounter: Payer: Self-pay | Admitting: Nurse Practitioner

## 2018-12-04 NOTE — Progress Notes (Signed)
Electrophysiology Office Note Date: 12/05/2018  ID:  [name], DOB 09/30/1961, MRN [id number]  PCP: Golden Circle, FNP Electrophysiologist: Caryl Comes  CC: palpitations follow up  [name] is a 58 y.o. female seen today for Dr Caryl Comes.  She presents today for routine electrophysiology followup.  Since last being seen in our clinic, the patient reports doing relatively well. She has only been taking Cardizem prn because she was worried that she did not have any more refills. When she takes her Diltiazem daily, her palpitations are resolved and she has no shortness of breath. Since taking prn, she has noticed increased heart rate and increased shortness of breath.  She denies chest pain, PND, orthopnea, nausea, vomiting, dizziness, syncope, edema, weight gain, or early satiety.  Past Medical History:  Diagnosis Date  . Drug-induced skin rash 02/27/2017  . Encounter for antineoplastic chemotherapy 01/19/2016  . History of radiation therapy 05/02/11 to 06/08/11   right lung  . Lung cancer ([facility])    right lower lobe adenocarcinoma   Past Surgical History:  Procedure Laterality Date  . LUNG LOBECTOMY  04/18/2009   RLL  . PORTACATH PLACEMENT  02/29/2012   Procedure: INSERTION PORT-A-CATH;  Surgeon: Nicanor Alcon, MD;  Location: [facility];  Service: Thoracic;  Laterality: Left;  PowerPort 8 F attachable in left internal jugular.    Current Outpatient Medications  Medication Sig Dispense Refill  . acetaminophen (TYLENOL) 500 MG tablet Take 500 mg every 6 (six) hours as needed by mouth.    Marland Kitchen albuterol (PROAIR HFA) 108 (90 Base) MCG/ACT inhaler Inhale 1-2 puffs into the lungs every 6 (six) hours as needed for wheezing or shortness of breath. 1 Inhaler 2  . chlorpheniramine-HYDROcodone (TUSSIONEX) 10-8 MG/5ML SUER TAKE 5 MLS BY MOUTH TWICE DAILY 280 mL 0  . clindamycin (CLEOCIN T) 1 % lotion APPLY TO AFFECTED AREA TWICE A DAY 60 mL 0  . diltiazem (CARDIZEM CD) 120 MG 24 hr capsule Take 1 capsule (120 mg  total) by mouth daily. 90 capsule 4  . erlotinib (TARCEVA) 150 MG tablet Take 1 tablet (150 mg total) by mouth daily. Take on an empty stomach at least 1 hour before or 2 hours after food. 30 tablet 11  . lidocaine-prilocaine (EMLA) cream Apply 1 application topically as needed. 30 g 0   No current facility-administered medications for this visit.     Allergies:   Codeine and Doxycycline   Social History: Social History   Socioeconomic History  . Marital status: Married    Spouse name: Not on file  . Number of children: 7  . Years of education: Not on file  . Highest education level: Not on file  Occupational History  . Not on file  Social Needs  . Financial resource strain: Not on file  . Food insecurity:    Worry: Not on file    Inability: Not on file  . Transportation needs:    Medical: Not on file    Non-medical: Not on file  Tobacco Use  . Smoking status: Never Smoker  . Smokeless tobacco: Never Used  Substance and Sexual Activity  . Alcohol use: No  . Drug use: No  . Sexual activity: Yes  Lifestyle  . Physical activity:    Days per week: Not on file    Minutes per session: Not on file  . Stress: Not on file  Relationships  . Social connections:    Talks on phone: Not on file  Gets together: Not on file    Attends religious service: Not on file    Active member of club or organization: Not on file    Attends meetings of clubs or organizations: Not on file    Relationship status: Not on file  . Intimate partner violence:    Fear of current or ex partner: Not on file    Emotionally abused: Not on file    Physically abused: Not on file    Forced sexual activity: Not on file  Other Topics Concern  . Not on file  Social History Narrative   09/01/2014   The patient is a 58 year old [place] woman.   The patient is married and has 7 children. One child passed away.   The patient came to the [place] in approximately 1990. They were in [place] for 10-11  years and then moved to [place], [place] since then.   Patient does not smoke, drink, or use illicit drugs.   Patient has not used smokeless tobacco products.   Fun/Hobby: Go shopping     Family History: Family History  Problem Relation Age of Onset  . Heart Problems Mother   . Cancer Neg Hx     Review of Systems: All other systems reviewed and are otherwise negative except as noted above.   Physical Exam: VS:  BP 126/82   Pulse (!) 112   Ht 4\' 9"  (1.448 m)   Wt 127 lb (57.6 kg)   LMP 02/10/2012   BMI 27.48 kg/m  , BMI Body mass index is 27.48 kg/m. Wt Readings from Last 3 Encounters:  12/05/18 127 lb (57.6 kg)  11/12/18 126 lb 4.8 oz (57.3 kg)  09/27/18 127 lb (57.6 kg)    GEN- The patient is well appearing, alert and oriented x 3 today.   HEENT: normocephalic, atraumatic; sclera clear, conjunctiva pink; hearing intact; oropharynx clear; neck supple  Lungs- Clear to ausculation bilaterally, normal work of breathing.  No wheezes, rales, rhonchi Heart- Tachycardic regular rate and rhythm  GI- soft, non-tender, non-distended, bowel sounds present  Extremities- no clubbing, cyanosis, or edema bilaterally MS- no significant deformity or atrophy Skin- warm and dry, no rash or lesion  Psych- euthymic mood, full affect Neuro- strength and sensation are intact   EKG:  EKG is ordered today. EKG today shows ST, rate 113  Recent Labs: 11/05/2018: ALT 15; BUN 13; Creatinine 0.71; Hemoglobin 13.2; Platelet Count 308; Potassium 4.5; Sodium 142    Other studies Reviewed: Additional studies/ records that were reviewed today include: Renee's office notes   Assessment and Plan:  1.  SVT She did well on daily Diltiazem but has been taking prn because she was worried about running out of medication We discussed importance of taking daily She will resume - refill sent in today  2.  Post termination pauses noted in hospital No clinical recurrence No change     Current medicines are reviewed at length with the patient today.   The patient does not have concerns regarding her medicines.  The following changes were made today:  none  Labs/ tests ordered today include: none No orders of the defined types were placed in this encounter.    Disposition:   Follow up with Renee in 6 months    Signed, Chanetta Marshall, NP 12/05/2018 1:36 PM   [place] [address] [phone] (office) [phone] (fax)

## 2018-12-05 ENCOUNTER — Ambulatory Visit (INDEPENDENT_AMBULATORY_CARE_PROVIDER_SITE_OTHER): Payer: Medicare Other | Admitting: Nurse Practitioner

## 2018-12-05 VITALS — BP 126/82 | HR 112 | Ht <= 58 in | Wt 127.0 lb

## 2018-12-05 DIAGNOSIS — I471 Supraventricular tachycardia: Secondary | ICD-10-CM | POA: Diagnosis not present

## 2018-12-05 MED ORDER — DILTIAZEM HCL ER COATED BEADS 120 MG PO CP24: 120.0000 mg | ORAL_CAPSULE | Freq: Every day | ORAL | 4 refills | Status: DC

## 2018-12-05 NOTE — Patient Instructions (Signed)
Medication Instructions:  none If you need a refill on your cardiac medications before your next appointment, please call your pharmacy.   Lab work: none If you have labs (blood work) drawn today and your tests are completely normal, you will receive your results only by: Marland Kitchen MyChart Message (if you have MyChart) OR . A paper copy in the mail If you have any lab test that is abnormal or we need to change your treatment, we will call you to review the results.  Testing/Procedures: none  Follow-Up: At [facility], you and your health needs are our priority.  As part of our continuing mission to provide you with exceptional heart care, we have created designated Provider Care Teams.  These Care Teams include your primary Cardiologist (physician) and Advanced Practice Providers (APPs -  Physician Assistants and Nurse Practitioners) who all work together to provide you with the care you need, when you need it. You will need a follow up appointment in 6 months.  Please call our office 2 months in advance to schedule this appointment.  You may see Renee or one of the following Advanced Practice Providers on your designated Care Team:   Chanetta Marshall, NP . Tommye Standard, PA-C  Any Other Special Instructions Will Be Listed Below (If Applicable).

## 2018-12-11 DIAGNOSIS — Z1211 Encounter for screening for malignant neoplasm of colon: Secondary | ICD-10-CM | POA: Diagnosis not present

## 2018-12-11 DIAGNOSIS — K5730 Diverticulosis of large intestine without perforation or abscess without bleeding: Secondary | ICD-10-CM | POA: Diagnosis not present

## 2018-12-11 DIAGNOSIS — D124 Benign neoplasm of descending colon: Secondary | ICD-10-CM | POA: Diagnosis not present

## 2018-12-14 DIAGNOSIS — D124 Benign neoplasm of descending colon: Secondary | ICD-10-CM | POA: Diagnosis not present

## 2018-12-14 DIAGNOSIS — Z1211 Encounter for screening for malignant neoplasm of colon: Secondary | ICD-10-CM | POA: Diagnosis not present

## 2018-12-24 ENCOUNTER — Inpatient Hospital Stay (HOSPITAL_BASED_OUTPATIENT_CLINIC_OR_DEPARTMENT_OTHER): Payer: Medicare Other | Admitting: Internal Medicine

## 2018-12-24 ENCOUNTER — Telehealth: Payer: Self-pay | Admitting: Internal Medicine

## 2018-12-24 ENCOUNTER — Encounter: Payer: Self-pay | Admitting: Internal Medicine

## 2018-12-24 ENCOUNTER — Inpatient Hospital Stay: Payer: Medicare Other | Attending: Internal Medicine

## 2018-12-24 VITALS — BP 128/83 | HR 94 | Temp 98.1°F | Resp 18 | Wt 124.2 lb

## 2018-12-24 DIAGNOSIS — Z923 Personal history of irradiation: Secondary | ICD-10-CM

## 2018-12-24 DIAGNOSIS — Z79899 Other long term (current) drug therapy: Secondary | ICD-10-CM | POA: Insufficient documentation

## 2018-12-24 DIAGNOSIS — Z902 Acquired absence of lung [part of]: Secondary | ICD-10-CM | POA: Diagnosis not present

## 2018-12-24 DIAGNOSIS — C3431 Malignant neoplasm of lower lobe, right bronchus or lung: Secondary | ICD-10-CM

## 2018-12-24 DIAGNOSIS — Z5111 Encounter for antineoplastic chemotherapy: Secondary | ICD-10-CM

## 2018-12-24 DIAGNOSIS — C3491 Malignant neoplasm of unspecified part of right bronchus or lung: Secondary | ICD-10-CM

## 2018-12-24 DIAGNOSIS — Z9221 Personal history of antineoplastic chemotherapy: Secondary | ICD-10-CM

## 2018-12-24 DIAGNOSIS — Z95828 Presence of other vascular implants and grafts: Secondary | ICD-10-CM

## 2018-12-24 DIAGNOSIS — I10 Essential (primary) hypertension: Secondary | ICD-10-CM

## 2018-12-24 LAB — CMP (CANCER CENTER ONLY)
ALT: 20 U/L (ref 0–44)
AST: 25 U/L (ref 15–41)
Albumin: 4.1 g/dL (ref 3.5–5.0)
Alkaline Phosphatase: 91 U/L (ref 38–126)
Anion gap: 8 (ref 5–15)
BUN: 10 mg/dL (ref 6–20)
CO2: 29 mmol/L (ref 22–32)
Calcium: 9.3 mg/dL (ref 8.9–10.3)
Chloride: 104 mmol/L (ref 98–111)
Creatinine: 0.65 mg/dL (ref 0.44–1.00)
GFR, Est AFR Am: >60 mL/min (ref >60)
GFR, Estimated: >60 mL/min (ref >60)
Glucose, Bld: 90 mg/dL (ref 70–99)
Potassium: 4 mmol/L (ref 3.5–5.1)
Sodium: 141 mmol/L (ref 135–145)
Total Bilirubin: 0.6 mg/dL (ref 0.3–1.2)
Total Protein: 7.9 g/dL (ref 6.5–8.1)

## 2018-12-24 LAB — CBC WITH DIFFERENTIAL (CANCER CENTER ONLY)
Abs Immature Granulocytes: 0.01 10*3/uL (ref 0.00–0.07)
Basophils Absolute: 0 10*3/uL (ref 0.0–0.1)
Basophils Relative: 0 %
Eosinophils Absolute: 0.1 10*3/uL (ref 0.0–0.5)
Eosinophils Relative: 1 %
HCT: 41.9 % (ref 36.0–46.0)
Hemoglobin: 13.2 g/dL (ref 12.0–15.0)
Immature Granulocytes: 0 %
Lymphocytes Relative: 22 %
Lymphs Abs: 1.7 10*3/uL (ref 0.7–4.0)
MCH: 28.1 pg (ref 26.0–34.0)
MCHC: 31.5 g/dL (ref 30.0–36.0)
MCV: 89.3 fL (ref 80.0–100.0)
Monocytes Absolute: 0.6 10*3/uL (ref 0.1–1.0)
Monocytes Relative: 8 %
Neutro Abs: 5.4 10*3/uL (ref 1.7–7.7)
Neutrophils Relative %: 69 %
Platelet Count: 346 10*3/uL (ref 150–400)
RBC: 4.69 MIL/uL (ref 3.87–5.11)
RDW: 14.2 % (ref 11.5–15.5)
WBC Count: 7.9 10*3/uL (ref 4.0–10.5)
nRBC: 0 % (ref 0.0–0.2)

## 2018-12-24 MED ORDER — CLINDAMYCIN PHOSPHATE 1 % EX LOTN: TOPICAL_LOTION | CUTANEOUS | 0 refills | Status: DC

## 2018-12-24 MED ORDER — ALBUTEROL SULFATE HFA 108 (90 BASE) MCG/ACT IN AERS: 1.0000 | INHALATION_SPRAY | Freq: Four times a day (QID) | RESPIRATORY_TRACT | 2 refills | Status: DC | PRN

## 2018-12-24 MED ORDER — DOXYCYCLINE HYCLATE 100 MG PO TABS: 100.0000 mg | ORAL_TABLET | Freq: Two times a day (BID) | ORAL | 14 refills | Status: DC

## 2018-12-24 MED ORDER — HYDROCOD POLST-CPM POLST ER 10-8 MG/5ML PO SUER: ORAL | 0 refills | Status: DC

## 2018-12-24 NOTE — Progress Notes (Signed)
[place] [phone]   [phone]  OFFICE PROGRESS NOTE  [place], FNP [address] [place] [address]  PRINCIPAL DIAGNOSIS: Local recurrence of non-small cell lung cancer, adenocarcinoma, initially diagnosed as stage IB (T2a N0 M0) in March of 2010.   PRIOR THERAPY:  1. Status post right lower lobectomy under the care of Dr. Cyndia Bent on 04/08/2009. 2. Status post palliative radiotherapy to the right lower lobe recurrent lung mass under the care of Dr. Lisbeth Renshaw, completed on June 08, 2011. 3. Systemic chemotherapy with carboplatin for AUC of 5 and Alimta 500 mg/M2 every 3 weeks. She is status post 3 cycles.  CURRENT THERAPY: Tarceva 150 mg by mouth daily, therapy beginning 07/24/2012. Status post approximately 77 months of therapy.   INTERVAL HISTORY: [name] 58 y.o. female returns to the clinic today for follow-up visit accompanied by her interpreter.  The patient is feeling fine today with no concerning complaints except for the rash on the eyebrows bilaterally in addition to the dry cough.  She denied having any chest pain, shortness of breath or hemoptysis.  She denied having any fever or chills.  She has no nausea, vomiting, diarrhea or constipation.  She denied having any headache or visual changes.  She is traveling to [place] in few weeks.  She is expected to receive vaccination from the health department before travel.  She is here today for evaluation and repeat blood work.  MEDICAL HISTORY: Past Medical History:  Diagnosis Date  . Drug-induced skin rash 02/27/2017  . Encounter for antineoplastic chemotherapy 01/19/2016  . History of radiation therapy 05/02/11 to 06/08/11   right lung  . Lung cancer ([place])    right lower lobe adenocarcinoma    ALLERGIES:  is allergic to codeine and doxycycline.  MEDICATIONS:  Current Outpatient Medications  Medication Sig Dispense Refill  . acetaminophen (TYLENOL) 500 MG tablet  Take 500 mg every 6 (six) hours as needed by mouth.    Marland Kitchen albuterol (PROAIR HFA) 108 (90 Base) MCG/ACT inhaler Inhale 1-2 puffs into the lungs every 6 (six) hours as needed for wheezing or shortness of breath. 1 Inhaler 2  . chlorpheniramine-HYDROcodone (TUSSIONEX) 10-8 MG/5ML SUER TAKE 5 MLS BY MOUTH TWICE DAILY 280 mL 0  . clindamycin (CLEOCIN T) 1 % lotion APPLY TO AFFECTED AREA TWICE A DAY 60 mL 0  . diltiazem (CARDIZEM CD) 120 MG 24 hr capsule Take 1 capsule (120 mg total) by mouth daily. 90 capsule 4  . erlotinib (TARCEVA) 150 MG tablet Take 1 tablet (150 mg total) by mouth daily. Take on an empty stomach at least 1 hour before or 2 hours after food. 30 tablet 11  . lidocaine-prilocaine (EMLA) cream Apply 1 application topically as needed. 30 g 0   No current facility-administered medications for this visit.     SURGICAL HISTORY:  Past Surgical History:  Procedure Laterality Date  . LUNG LOBECTOMY  04/18/2009   RLL  . PORTACATH PLACEMENT  02/29/2012   Procedure: INSERTION PORT-A-CATH;  Surgeon: Nicanor Alcon, MD;  Location: [place];  Service: Thoracic;  Laterality: Left;  PowerPort 8 F attachable in left internal jugular.    REVIEW OF SYSTEMS:  A comprehensive review of systems was negative except for: Respiratory: positive for cough Integument/breast: positive for rash   PHYSICAL EXAMINATION: General appearance: alert, cooperative and no distress Head: Normocephalic, without obvious abnormality, atraumatic Neck: no adenopathy, no JVD, supple, symmetrical, trachea  midline and thyroid not enlarged, symmetric, no tenderness/mass/nodules Lymph nodes: Cervical, supraclavicular, and axillary nodes normal. Resp: clear to auscultation bilaterally Back: symmetric, no curvature. ROM normal. No CVA tenderness. Cardio: regular rate and rhythm, S1, S2 normal, no murmur, click, rub or gallop GI: soft, non-tender; bowel sounds normal; no masses,  no organomegaly Extremities: extremities normal,  atraumatic, no cyanosis or edema  ECOG PERFORMANCE STATUS: 1 - Symptomatic but completely ambulatory  Blood pressure 128/83, pulse 94, temperature 98.1 F (36.7 C), temperature source Oral, resp. rate 18, weight 124 lb 4 oz (56.4 kg), last menstrual period 02/10/2012, SpO2 99 %.  LABORATORY DATA: Lab Results  Component Value Date   WBC 7.9 12/24/2018   HGB 13.2 12/24/2018   HCT 41.9 12/24/2018   MCV 89.3 12/24/2018   PLT 346 12/24/2018      Chemistry      Component Value Date/Time   NA 142 11/05/2018 1109   NA 143 10/30/2017 1031   K 4.5 11/05/2018 1109   K 4.3 10/30/2017 1031   CL 103 11/05/2018 1109   CL 102 04/30/2013 1002   CO2 28 11/05/2018 1109   CO2 29 10/30/2017 1031   BUN 13 11/05/2018 1109   BUN 14.6 10/30/2017 1031   CREATININE 0.71 11/05/2018 1109   CREATININE 0.7 10/30/2017 1031      Component Value Date/Time   CALCIUM 9.2 11/05/2018 1109   CALCIUM 9.2 10/30/2017 1031   ALKPHOS 92 11/05/2018 1109   ALKPHOS 103 10/30/2017 1031   AST 19 11/05/2018 1109   AST 20 10/30/2017 1031   ALT 15 11/05/2018 1109   ALT 14 10/30/2017 1031   BILITOT 0.7 11/05/2018 1109   BILITOT 0.79 10/30/2017 1031       RADIOGRAPHIC STUDIES: No results found.  ASSESSMENT AND PLAN:  This is a very pleasant 58 years old Asian female with recurrent non-small cell lung cancer, adenocarcinoma status post surgical resection followed by systemic chemotherapy with carboplatin and Alimta followed by disease progression and the patient was started on treatment with Tarceva 150 mg by mouth daily status post 77 months of treatment. The patient has been tolerating this treatment well with no concerning adverse effects except for the skin rash on the eyebrows as well as dry cough. I recommended for the patient to continue her current treatment with Tarceva with the same dose. For the skin rash I will give the patient refill for clindamycin lotion as well as doxycycline 100 mg p.o. twice daily  for 7 days. For the dry cough she received refill for Hycodan as well as albuterol inhaler. The patient will come back for follow-up visit in 5 weeks for evaluation and repeat blood work before her travel to [place]. All questions were answered. The patient knows to call the clinic with any problems, questions or concerns. We can certainly see the patient much sooner if necessary.  Disclaimer: This note was dictated with voice recognition software. Similar sounding words can inadvertently be transcribed and may not be corrected upon review.

## 2018-12-24 NOTE — Telephone Encounter (Signed)
Scheduled appt per 01/27 los.  Printed calendar and avs.

## 2019-01-08 ENCOUNTER — Telehealth: Payer: Self-pay | Admitting: Medical Oncology

## 2019-01-08 NOTE — Telephone Encounter (Signed)
dtr , [name], request increase quantity for tarceva as pt travelling to [place] on march 5th and returning March 29th. She is about to run out of medicine.  Message to [facility].

## 2019-01-09 ENCOUNTER — Telehealth: Payer: Self-pay | Admitting: Pharmacist

## 2019-01-09 NOTE — Telephone Encounter (Signed)
Oral Oncology Pharmacist Encounter  Received request from Dr. Lew Dawes collaborative practice RN to research patient receiving greater than a 30-day supply of Tarceva from the [facility] ([place]) as patient will be traveling out of the country in March 2020.  I called [facility] at [phone] to inquire about above question. Representative stated that there are plenty of refills on Tarceva prescription and therefore pharmacy is able to dispense more than a 30-day supply at a time. When patient calls to order next fill of Tarceva, she must just request to send to fills at one time, while also provide any information that this request is due to vacation.  I called and left voicemail for daughter, Roslyn Smiling, to return call to oral oncology clinic so that I may provide above information.  Johny Drilling, PharmD, BCPS, BCOP  01/09/2019 1:18 PM Oral Oncology Clinic [phone]

## 2019-01-09 NOTE — Telephone Encounter (Signed)
Oral Chemotherapy Pharmacist Encounter  Follow-Up Form  Spoke with patient's daughter, Roslyn Smiling, today to follow up regarding patient's oral chemotherapy medication: Tarceva (erlotinib) for the treatment of metastatic, EGFR mutation positive non small cell lung cancer, planned duration until disease progression or unacceptable toxicity.  Original Start date of oral chemotherapy: 07/24/2012  Patient continues to do well with therapy.  Molisa reports that patient has missed 0 tablets/doses of Tarceva 136m tablets, 1 tablet taken by mouth on an empty stomach 1 hour before or 2 hours after food, in the last month.   Pt reports the following side effects: manageable skin rash on face and hair follicles on arms  Pertinent labs reviewed: OK for continued treatment.  Other Issues: I went over instructions for [facility] greater than [facility] out of the country next month.  Molisa knows to call the office with questions or concerns. Oral Oncology Clinic will continue to follow.  JJohny Drilling PharmD, BCPS, BCOP  01/09/2019 1:29 PM Oral Oncology Clinic [phone]

## 2019-01-11 NOTE — Telephone Encounter (Signed)
Oral Oncology Pharmacist Encounter  Received call from patient's daughter, Roslyn Smiling, with information that her brother had called [facility] pharmacy this morning to order to fills of Tarceva for his mom and the pharmacy had told the family member that they are unable to ship more than a 30-day supply at one time.  I called [facility] [employer] at [phone] to follow-up on this request, as I was told on 01/09/2019 that patient would be able to order a 54-month supply when she called for this next fill, to cover the entire time that she would be out of the country.  Representative stated that family member should have been able to order this 73-month supply and I was transferred to [facility] pharmacy to follow-up this with dispensing pharmacy.  Dispensing pharmacy was able to add an additional bottle to refill request that was placed today to deliver to patient's home on 01/17/2019. Family should expect a 60-day supply to be delivered on that day.  I called patient's daughter, Roslyn Smiling, and updated her with above information. She knows to contact oral oncology clinic if she does not receive 2 bottles of Tarceva on 01/17/2019.  All questions answered. Daughter expressed understanding and appreciation. She knows to call the office with any additional questions or concerns.  Johny Drilling, PharmD, BCPS, BCOP  01/11/2019 11:32 AM Oral Oncology Clinic [phone]

## 2019-01-14 ENCOUNTER — Other Ambulatory Visit: Payer: Self-pay | Admitting: Emergency Medicine

## 2019-01-14 ENCOUNTER — Telehealth: Payer: Self-pay | Admitting: Internal Medicine

## 2019-01-14 ENCOUNTER — Telehealth: Payer: Self-pay | Admitting: Emergency Medicine

## 2019-01-14 DIAGNOSIS — R059 Cough, unspecified: Secondary | ICD-10-CM

## 2019-01-14 DIAGNOSIS — R05 Cough: Secondary | ICD-10-CM

## 2019-01-14 NOTE — Telephone Encounter (Signed)
Scheduled appt  per 2/17 sch message - confirmed appts with daughter Roslyn Smiling,.

## 2019-01-14 NOTE — Telephone Encounter (Signed)
Received call from pt's daughter reporting increased SOB and gen fatigue over last few days.  Denies fever/chills or N/V/D at this time.  Scheduling message sent for pt to be seen on 2/18 for labs and [facility] appt.  Scheduling to call pt with appts.  Will follow up as needed.

## 2019-01-15 ENCOUNTER — Ambulatory Visit (HOSPITAL_COMMUNITY)
Admission: RE | Admit: 2019-01-15 | Discharge: 2019-01-15 | Disposition: A | Discharge status: Home / Self Care | Payer: Medicare Other | Source: Ambulatory Visit | Attending: Medical | Admitting: Medical

## 2019-01-15 ENCOUNTER — Inpatient Hospital Stay: Payer: Medicare Other

## 2019-01-15 ENCOUNTER — Inpatient Hospital Stay: Payer: Medicare Other | Attending: Internal Medicine | Admitting: Medical

## 2019-01-15 VITALS — BP 115/83 | HR 105 | Temp 98.4°F | Resp 19 | Ht <= 58 in | Wt 127.3 lb

## 2019-01-15 DIAGNOSIS — J90 Pleural effusion, not elsewhere classified: Secondary | ICD-10-CM | POA: Diagnosis not present

## 2019-01-15 DIAGNOSIS — R062 Wheezing: Secondary | ICD-10-CM | POA: Diagnosis not present

## 2019-01-15 DIAGNOSIS — Z79899 Other long term (current) drug therapy: Secondary | ICD-10-CM

## 2019-01-15 DIAGNOSIS — R05 Cough: Secondary | ICD-10-CM

## 2019-01-15 DIAGNOSIS — C3431 Malignant neoplasm of lower lobe, right bronchus or lung: Secondary | ICD-10-CM

## 2019-01-15 DIAGNOSIS — R059 Cough, unspecified: Secondary | ICD-10-CM

## 2019-01-15 DIAGNOSIS — J069 Acute upper respiratory infection, unspecified: Secondary | ICD-10-CM

## 2019-01-15 MED ORDER — AZITHROMYCIN 250 MG PO TABS: ORAL_TABLET | ORAL | 0 refills | Status: DC

## 2019-01-15 MED ORDER — ALBUTEROL SULFATE (2.5 MG/3ML) 0.083% IN NEBU
INHALATION_SOLUTION | RESPIRATORY_TRACT | Status: AC
  Filled 2019-01-15: qty 3

## 2019-01-15 MED ORDER — BENZONATATE 100 MG PO CAPS: 100.0000 mg | ORAL_CAPSULE | Freq: Three times a day (TID) | ORAL | 0 refills | Status: DC | PRN

## 2019-01-15 MED ORDER — PREDNISONE 5 MG PO TABS: ORAL_TABLET | ORAL | 0 refills | Status: DC

## 2019-01-15 MED ORDER — ALBUTEROL SULFATE (2.5 MG/3ML) 0.083% IN NEBU
2.5000 mg | INHALATION_SOLUTION | Freq: Once | RESPIRATORY_TRACT | Status: AC
  Administered 2019-01-15: 2.5 mg via RESPIRATORY_TRACT
  Filled 2019-01-15: qty 3

## 2019-01-15 MED FILL — AZITHROMYCIN 250 MG TABLET: 250 | 5 days supply | Qty: 6 | Fill #0

## 2019-01-15 MED FILL — predniSONE 5 MG TABS: 5 | 6 days supply | Qty: 21 | Fill #0

## 2019-01-15 MED FILL — BENZONATATE 100 MG CAP: 100 | 6 days supply | Qty: 20 | Fill #0

## 2019-01-15 NOTE — Progress Notes (Signed)
Interpreter present for clinic visit today.  Pt presents for wet cough with white/clear thick sputum x1 wk.  Denies CP or fever/chills or rash.  Some SOB d/t excessive coughing which also causes upper bilat abdominal/side soreness.  Reports soreness/aching in throat with frequent cough but no drainage, runny nose, earache, or sinus pressure.  Denies changes in urination or bowel habits besides stating "my urine feels warmer".  A&Ox4.  PA Lucianne Lei aware.  Pt given albuterol nebulizer treatment.  Tolerated well.  Reports feeling better afterwards with dec tightness/cough.  PA Lucianne Lei aware.  Pt refused peripheral stick for labs today, states "I have a port".  Pt has not had port accessed & flushed since 2018.  VO from PA [place] to not access port for labs at this time and to d/c labs for this visit.  Pt to have labs drawn at next visit with MD Julien Nordmann, RN Diane at the desk made aware of pt not being drawn today/current port flush status.  Pt ambulatory to CXR appt with translator.

## 2019-01-15 NOTE — Patient Instructions (Signed)

## 2019-01-16 NOTE — Progress Notes (Signed)
Symptoms Management Clinic Progress Note   [name] [id number] 1961-11-17 58 y.o.  [name] is managed by Dr. Fanny [name]. [name]  Actively treated with chemotherapy/immunotherapy/hormonal therapy: yes  Current therapy: Tarceva  Next scheduled appointment with provider: 01/28/2019  Assessment: Plan:    Wheezing - Plan: albuterol (PROVENTIL) (2.5 MG/3ML) 0.083% nebulizer solution 2.5 mg, DG Chest 2 View, predniSONE (DELTASONE) 5 MG tablet, benzonatate (TESSALON) 100 MG capsule, azithromycin (ZITHROMAX) 250 MG tablet  Primary cancer of right lower lobe of lung ([place]) - Plan: DG Chest 2 View  Cough - Plan: DG Chest 2 View, predniSONE (DELTASONE) 5 MG tablet, benzonatate (TESSALON) 100 MG capsule, azithromycin (ZITHROMAX) 250 MG tablet  Upper respiratory tract infection, unspecified type   URI with wheezing and cough: The patient was referred for chest x-ray which returned showing: Persisting right-sided pleural effusion, relatively unchanged over time. The patient was given albuterol nebulizer treatment.  Additionally she was given a 5-day prednisone taper, Tessalon Perles, and a Z-Pak.  Local recurrence of a non-small cell lung cancer, adenocarcinoma: The patient continues to be followed by Dr. Fanny [name]. [name] and is treated with Tarceva.  She is scheduled to return to see him in follow-up on 01/28/2019.  Please see After Visit Summary for patient specific instructions.  Future Appointments  Date Time Provider [place]  01/28/2019 11:00 AM [facility] LAB 5 [facility] None  01/28/2019 11:15 AM [facility] [place] FLUSH [facility] None  01/28/2019 11:30 AM Curt Bears, MD [facility] None  01/28/2019 12:00 PM Kennith [place], LCSW [facility] None    Orders Placed This Encounter  Procedures  . DG Chest 2 View       Subjective:   Patient ID:  [name] is a 58 y.o. (DOB March 07, 1961) female.  Chief Complaint:  Chief Complaint  Patient presents with  . Cough     HPI [name] is a 58 year old female with a history of a local recurrence of a non-small cell lung cancer, adenocarcinoma which was initially diagnosed in March 2010.  She continues to be followed by Dr. Parthenia [name]. [name] and is maintained on [place].  She presents to the clinic today with a one-week history of a productive cough with clear thick sputum.  She has been coughing so much that her abdomen hurts.  She reports having a mild sore throat and some shortness of breath.  She denies chest pain, fevers, chills, dysuria, polyuria, nausea, vomiting, or diarrhea.  Medications: I have reviewed the patient's current medications.  Allergies:  Allergies  Allergen Reactions  . Codeine Nausea Only  . Doxycycline Nausea And Vomiting    vomiting and headache    Past Medical History:  Diagnosis Date  . Drug-induced skin rash 02/27/2017  . Encounter for antineoplastic chemotherapy 01/19/2016  . History of radiation therapy 05/02/11 to 06/08/11   right lung  . Lung cancer ([place])    right lower lobe adenocarcinoma    Past Surgical History:  Procedure Laterality Date  . LUNG LOBECTOMY  04/18/2009   RLL  . PORTACATH PLACEMENT  02/29/2012   Procedure: INSERTION PORT-A-CATH;  Surgeon: Nicanor Alcon, MD;  Location: [place];  Service: Thoracic;  Laterality: Left;  PowerPort 8 F attachable in left internal jugular.    Family History  Problem Relation Age of Onset  . Heart Problems Mother   . Cancer Neg Hx     Social History   Socioeconomic History  . Marital status: Married    Spouse name: Not on file  .  Number of children: 7  . Years of education: Not on file  . Highest education level: Not on file  Occupational History  . Not on file  Social Needs  . Financial resource strain: Not on file  . Food insecurity:    Worry: Not on file    Inability: Not on file  . Transportation needs:    Medical: Not on file    Non-medical: Not on file  Tobacco Use  . Smoking status: Never Smoker  .  Smokeless tobacco: Never Used  Substance and Sexual Activity  . Alcohol use: No  . Drug use: No  . Sexual activity: Yes  Lifestyle  . Physical activity:    Days per week: Not on file    Minutes per session: Not on file  . Stress: Not on file  Relationships  . Social connections:    Talks on phone: Not on file    Gets together: Not on file    Attends religious service: Not on file    Active member of club or organization: Not on file    Attends meetings of clubs or organizations: Not on file    Relationship status: Not on file  . Intimate partner violence:    Fear of current or ex partner: Not on file    Emotionally abused: Not on file    Physically abused: Not on file    Forced sexual activity: Not on file  Other Topics Concern  . Not on file  Social History Narrative   09/01/2014   The patient is a 58 year old [place] woman.   The patient is married and has 7 children. One child passed away.   The patient came to the [place] in approximately 1990. They were in [place] for 10-11 years and then moved to [place], [place] since then.   Patient does not smoke, drink, or use illicit drugs.   Patient has not used smokeless tobacco products.   Fun/Hobby: Go shopping     Past Medical History, Surgical history, Social history, and Family history were reviewed and updated as appropriate.   Please see review of systems for further details on the patient's review from today.   Review of Systems:  Review of Systems  Constitutional: Negative for chills, diaphoresis, fatigue and fever.  HENT: Positive for sore throat. Negative for congestion, postnasal drip and rhinorrhea.   Respiratory: Positive for cough and shortness of breath. Negative for wheezing.   Cardiovascular: Negative for palpitations.  Gastrointestinal: Positive for abdominal pain.  Genitourinary: Negative for difficulty urinating, dysuria and frequency.  Neurological: Negative for headaches.     Objective:   Physical Exam:  BP 115/83 (BP Location: Left Arm, Patient Position: Sitting)   Pulse (!) 105   Temp 98.4 F (36.9 C) (Oral)   Resp 19   Ht 4\' 9"  (1.448 m)   Wt 127 lb 4.8 oz (57.7 kg)   LMP 02/10/2012   BMI 27.55 kg/m  ECOG: 0  Physical Exam Constitutional:      General: She is not in acute distress.    Appearance: She is not diaphoretic.  HENT:     Head: Normocephalic and atraumatic.     Right Ear: External ear normal.     Left Ear: External ear normal.     Mouth/Throat:     Pharynx: No oropharyngeal exudate.  Neck:     Musculoskeletal: Normal range of motion and neck supple.  Cardiovascular:     Rate and Rhythm: Normal rate  and regular rhythm.     Heart sounds: Normal heart sounds. No murmur. No friction rub. No gallop.   Pulmonary:     Effort: Pulmonary effort is normal. No respiratory distress.     Breath sounds: Wheezing (Right upper lobe) present. No rales.  Lymphadenopathy:     Cervical: No cervical adenopathy.  Skin:    General: Skin is warm and dry.     Findings: No erythema or rash.  Neurological:     Mental Status: She is alert.     Coordination: Coordination normal.  Psychiatric:        Behavior: Behavior normal.        Thought Content: Thought content normal.        Judgment: Judgment normal.    Pre-albuterol exam: See above physical exam  Post-albuterol exam: Wheezing in the right upper lobe is markedly improved after an albuterol nebulizer treatment.  Lab Review:     Component Value Date/Time   NA 141 12/24/2018 1413   NA 143 10/30/2017 1031   K 4.0 12/24/2018 1413   K 4.3 10/30/2017 1031   CL 104 12/24/2018 1413   CL 102 04/30/2013 1002   CO2 29 12/24/2018 1413   CO2 29 10/30/2017 1031   GLUCOSE 90 12/24/2018 1413   GLUCOSE 89 10/30/2017 1031   GLUCOSE 95 04/30/2013 1002   BUN 10 12/24/2018 1413   BUN 14.6 10/30/2017 1031   CREATININE 0.65 12/24/2018 1413   CREATININE 0.7 10/30/2017 1031   CALCIUM 9.3 12/24/2018  1413   CALCIUM 9.2 10/30/2017 1031   PROT 7.9 12/24/2018 1413   PROT 7.7 10/30/2017 1031   ALBUMIN 4.1 12/24/2018 1413   ALBUMIN 3.9 10/30/2017 1031   AST 25 12/24/2018 1413   AST 20 10/30/2017 1031   ALT 20 12/24/2018 1413   ALT 14 10/30/2017 1031   ALKPHOS 91 12/24/2018 1413   ALKPHOS 103 10/30/2017 1031   BILITOT 0.6 12/24/2018 1413   BILITOT 0.79 10/30/2017 1031   GFRNONAA >60 12/24/2018 1413   GFRAA >60 12/24/2018 1413       Component Value Date/Time   WBC 7.9 12/24/2018 1413   WBC 7.6 12/11/2017 0950   RBC 4.69 12/24/2018 1413   HGB 13.2 12/24/2018 1413   HGB 13.2 10/30/2017 1031   HCT 41.9 12/24/2018 1413   HCT 40.2 10/30/2017 1031   PLT 346 12/24/2018 1413   PLT 306 10/30/2017 1031   MCV 89.3 12/24/2018 1413   MCV 86.1 10/30/2017 1031   MCH 28.1 12/24/2018 1413   MCHC 31.5 12/24/2018 1413   RDW 14.2 12/24/2018 1413   RDW 14.5 10/30/2017 1031   LYMPHSABS 1.7 12/24/2018 1413   LYMPHSABS 1.3 10/30/2017 1031   MONOABS 0.6 12/24/2018 1413   MONOABS 0.5 10/30/2017 1031   EOSABS 0.1 12/24/2018 1413   EOSABS 0.1 10/30/2017 1031   BASOSABS 0.0 12/24/2018 1413   BASOSABS 0.0 10/30/2017 1031   -------------------------------  Imaging from last 24 hours (if applicable):  Radiology interpretation: Dg Chest 2 View  Result Date: 01/15/2019 CLINICAL DATA:  58 year old female with history of lung carcinoma and wheezing EXAM: CHEST - 2 VIEW COMPARISON:  CT 11/08/2018.  Plain film 10/05/2017 FINDINGS: Cardiomediastinal silhouette unchanged in size and contour. Persisting opacity at the right lung base with blunting of the right costophrenic angle, right cardiophrenic angle, and the sulcus on the lateral view. No pneumothorax. Left IJ port catheter, terminates superior vena cava. No evidence of left-sided airspace disease IMPRESSION: Persisting right-sided pleural effusion, relatively  unchanged over time. Left IJ port catheter. Electronically Signed   By: Corrie Mckusick D.O.    On: 01/15/2019 12:48        This case was discussed with Dr. Julien Nordmann. He expressed agreement with my management of this patient.

## 2019-01-28 ENCOUNTER — Inpatient Hospital Stay: Payer: Medicare Other | Admitting: *Deleted

## 2019-01-28 ENCOUNTER — Telehealth: Payer: Self-pay | Admitting: Internal Medicine

## 2019-01-28 ENCOUNTER — Encounter: Payer: Self-pay | Admitting: Internal Medicine

## 2019-01-28 ENCOUNTER — Inpatient Hospital Stay: Payer: Medicare Other

## 2019-01-28 ENCOUNTER — Inpatient Hospital Stay: Payer: Medicare Other | Attending: Internal Medicine

## 2019-01-28 ENCOUNTER — Inpatient Hospital Stay (HOSPITAL_BASED_OUTPATIENT_CLINIC_OR_DEPARTMENT_OTHER): Payer: Medicare Other | Admitting: Internal Medicine

## 2019-01-28 ENCOUNTER — Encounter: Payer: Self-pay | Admitting: Medical Oncology

## 2019-01-28 VITALS — BP 126/85 | HR 99 | Temp 98.9°F | Resp 18 | Ht <= 58 in | Wt 127.4 lb

## 2019-01-28 DIAGNOSIS — R05 Cough: Secondary | ICD-10-CM

## 2019-01-28 DIAGNOSIS — C3431 Malignant neoplasm of lower lobe, right bronchus or lung: Secondary | ICD-10-CM | POA: Diagnosis not present

## 2019-01-28 DIAGNOSIS — Z95828 Presence of other vascular implants and grafts: Secondary | ICD-10-CM

## 2019-01-28 DIAGNOSIS — Z5111 Encounter for antineoplastic chemotherapy: Secondary | ICD-10-CM

## 2019-01-28 DIAGNOSIS — Z923 Personal history of irradiation: Secondary | ICD-10-CM | POA: Diagnosis not present

## 2019-01-28 DIAGNOSIS — C3491 Malignant neoplasm of unspecified part of right bronchus or lung: Secondary | ICD-10-CM

## 2019-01-28 DIAGNOSIS — R21 Rash and other nonspecific skin eruption: Secondary | ICD-10-CM

## 2019-01-28 LAB — CBC WITH DIFFERENTIAL (CANCER CENTER ONLY)
Abs Immature Granulocytes: 0.01 10*3/uL (ref 0.00–0.07)
Basophils Absolute: 0 10*3/uL (ref 0.0–0.1)
Basophils Relative: 0 %
Eosinophils Absolute: 0.1 10*3/uL (ref 0.0–0.5)
Eosinophils Relative: 1 %
HCT: 37.2 % (ref 36.0–46.0)
Hemoglobin: 11.8 g/dL — ABNORMAL LOW (ref 12.0–15.0)
Immature Granulocytes: 0 %
Lymphocytes Relative: 16 %
Lymphs Abs: 1.3 10*3/uL (ref 0.7–4.0)
MCH: 28.7 pg (ref 26.0–34.0)
MCHC: 31.7 g/dL (ref 30.0–36.0)
MCV: 90.5 fL (ref 80.0–100.0)
Monocytes Absolute: 0.6 10*3/uL (ref 0.1–1.0)
Monocytes Relative: 7 %
Neutro Abs: 6.2 10*3/uL (ref 1.7–7.7)
Neutrophils Relative %: 76 %
Platelet Count: 360 10*3/uL (ref 150–400)
RBC: 4.11 MIL/uL (ref 3.87–5.11)
RDW: 14 % (ref 11.5–15.5)
WBC Count: 8.2 10*3/uL (ref 4.0–10.5)
nRBC: 0 % (ref 0.0–0.2)

## 2019-01-28 LAB — CMP (CANCER CENTER ONLY)
ALT: 18 U/L (ref 0–44)
AST: 23 U/L (ref 15–41)
Albumin: 3.5 g/dL (ref 3.5–5.0)
Alkaline Phosphatase: 91 U/L (ref 38–126)
Anion gap: 8 (ref 5–15)
BUN: 11 mg/dL (ref 6–20)
CO2: 27 mmol/L (ref 22–32)
Calcium: 8.3 mg/dL — ABNORMAL LOW (ref 8.9–10.3)
Chloride: 106 mmol/L (ref 98–111)
Creatinine: 0.6 mg/dL (ref 0.44–1.00)
GFR, Est AFR Am: >60 mL/min (ref >60)
GFR, Estimated: >60 mL/min (ref >60)
Glucose, Bld: 93 mg/dL (ref 70–99)
Potassium: 4 mmol/L (ref 3.5–5.1)
Sodium: 141 mmol/L (ref 135–145)
Total Bilirubin: 0.6 mg/dL (ref 0.3–1.2)
Total Protein: 7 g/dL (ref 6.5–8.1)

## 2019-01-28 MED ORDER — HYDROCOD POLST-CPM POLST ER 10-8 MG/5ML PO SUER: ORAL | 0 refills | Status: DC

## 2019-01-28 MED ORDER — CLINDAMYCIN PHOSPHATE 1 % EX LOTN: TOPICAL_LOTION | CUTANEOUS | 0 refills | Status: DC

## 2019-01-28 MED ORDER — HEPARIN SOD (PORK) LOCK FLUSH 100 UNIT/ML IV SOLN
500.0000 [IU] | Freq: Once | INTRAVENOUS | Status: AC | PRN
  Administered 2019-01-28: 500 [IU] via INTRAVENOUS
  Filled 2019-01-28: qty 5

## 2019-01-28 MED ORDER — DOXYCYCLINE HYCLATE 100 MG PO TABS: 100.0000 mg | ORAL_TABLET | Freq: Two times a day (BID) | ORAL | 0 refills | Status: DC

## 2019-01-28 MED ORDER — SODIUM CHLORIDE 0.9% FLUSH
10.0000 mL | INTRAVENOUS | Status: DC | PRN
  Administered 2019-01-28: 10 mL via INTRAVENOUS
  Filled 2019-01-28: qty 10

## 2019-01-28 MED FILL — DOXYCYCLINE HYCLATE 100 MG: 100 | 14 days supply | Qty: 28 | Fill #0

## 2019-01-28 MED FILL — HYDROCODONE-CHLORPHEN ER SU: 10-8 | 28 days supply | Qty: 280 | Fill #0

## 2019-01-28 NOTE — Progress Notes (Signed)
[facility] [phone]) [phone]   [phone]) [phone]  OFFICE PROGRESS NOTE  Golden Circle, FNP [address] [address] [place] [address]  PRINCIPAL DIAGNOSIS: Local recurrence of non-small cell lung cancer, adenocarcinoma, initially diagnosed as stage IB (T2a N0 M0) in March of 2010.   PRIOR THERAPY:  1. Status post right lower lobectomy under the care of Dr. Cyndia Bent on 04/08/2009. 2. Status post palliative radiotherapy to the right lower lobe recurrent lung mass under the care of Dr. Lisbeth Renshaw, completed on June 08, 2011. 3. Systemic chemotherapy with carboplatin for AUC of 5 and Alimta 500 mg/M2 every 3 weeks. She is status post 3 cycles.  CURRENT THERAPY: Tarceva 150 mg by mouth daily, therapy beginning 07/24/2012. Status post approximately 78 months of therapy.   INTERVAL HISTORY: [name] 58 y.o. female returns to the clinic today for follow-up visit accompanied by her daughter and [place] interpreter.  The patient is feeling fine today with no concerning complaints except for the dry cough.  She is requesting refill of her cough medication as well as clindamycin lotion and doxycycline because she is traveling to [place] in few days.  She denied having any chest pain, shortness of breath, or hemoptysis.  She has no weight loss or night sweats.  She has no headache or visual changes.  She continues to tolerate her treatment with Tarceva fairly well.  She is here today for evaluation and repeat blood work.  MEDICAL HISTORY: Past Medical History:  Diagnosis Date  . Drug-induced skin rash 02/27/2017  . Encounter for antineoplastic chemotherapy 01/19/2016  . History of radiation therapy 05/02/11 to 06/08/11   right lung  . Lung cancer ([facility])    right lower lobe adenocarcinoma    ALLERGIES:  is allergic to codeine and doxycycline.  MEDICATIONS:  Current Outpatient Medications  Medication Sig Dispense Refill  . acetaminophen (TYLENOL) 500 MG tablet Take  500 mg every 6 (six) hours as needed by mouth.    Marland Kitchen albuterol (PROAIR HFA) 108 (90 Base) MCG/ACT inhaler Inhale 1-2 puffs into the lungs every 6 (six) hours as needed for wheezing or shortness of breath. 1 Inhaler 2  . azithromycin (ZITHROMAX) 250 MG tablet 2 tabs day 1 then 1 tab days 2 to 5 6 each 0  . benzonatate (TESSALON) 100 MG capsule Take 1 capsule (100 mg total) by mouth 3 (three) times daily as needed for cough. 20 capsule 0  . chlorpheniramine-HYDROcodone (TUSSIONEX) 10-8 MG/5ML SUER TAKE 5 MLS BY MOUTH TWICE DAILY 280 mL 0  . clindamycin (CLEOCIN T) 1 % lotion APPLY TO AFFECTED AREA TWICE A DAY 60 mL 0  . diltiazem (CARDIZEM CD) 120 MG 24 hr capsule Take 1 capsule (120 mg total) by mouth daily. 90 capsule 4  . doxycycline (VIBRA-TABS) 100 MG tablet Take 1 tablet (100 mg total) by mouth 2 (two) times daily. 7 tablet 14  . erlotinib (TARCEVA) 150 MG tablet Take 1 tablet (150 mg total) by mouth daily. Take on an empty stomach at least 1 hour before or 2 hours after food. 30 tablet 11  . lidocaine-prilocaine (EMLA) cream Apply 1 application topically as needed. 30 g 0  . predniSONE (DELTASONE) 5 MG tablet 6 tab x 1 day, 5 tab x 1 day, 4 tab x 1 day, 3 tab x 1 day, 2 tab x 1 day, 1 tab x 1 day, stop 21 tablet 0   No current facility-administered medications for this  visit.     SURGICAL HISTORY:  Past Surgical History:  Procedure Laterality Date  . LUNG LOBECTOMY  04/18/2009   RLL  . PORTACATH PLACEMENT  02/29/2012   Procedure: INSERTION PORT-A-CATH;  Surgeon: Nicanor Alcon, MD;  Location: [facility];  Service: Thoracic;  Laterality: Left;  PowerPort 8 F attachable in left internal jugular.    REVIEW OF SYSTEMS:  A comprehensive review of systems was negative except for: Respiratory: positive for cough Integument/breast: positive for rash   PHYSICAL EXAMINATION: General appearance: alert, cooperative and no distress Head: Normocephalic, without obvious abnormality, atraumatic Neck: no  adenopathy, no JVD, supple, symmetrical, trachea midline and thyroid not enlarged, symmetric, no tenderness/mass/nodules Lymph nodes: Cervical, supraclavicular, and axillary nodes normal. Resp: clear to auscultation bilaterally Back: symmetric, no curvature. ROM normal. No CVA tenderness. Cardio: regular rate and rhythm, S1, S2 normal, no murmur, click, rub or gallop GI: soft, non-tender; bowel sounds normal; no masses,  no organomegaly Extremities: extremities normal, atraumatic, no cyanosis or edema  ECOG PERFORMANCE STATUS: 1 - Symptomatic but completely ambulatory  Blood pressure 126/85, pulse 99, temperature 98.9 F (37.2 C), temperature source Oral, resp. rate 18, height 4\' 9"  (1.448 m), weight 127 lb 6.4 oz (57.8 kg), last menstrual period 02/10/2012, SpO2 99 %.  LABORATORY DATA: Lab Results  Component Value Date   WBC 8.2 01/28/2019   HGB 11.8 (L) 01/28/2019   HCT 37.2 01/28/2019   MCV 90.5 01/28/2019   PLT 360 01/28/2019      Chemistry      Component Value Date/Time   NA 141 12/24/2018 1413   NA 143 10/30/2017 1031   K 4.0 12/24/2018 1413   K 4.3 10/30/2017 1031   CL 104 12/24/2018 1413   CL 102 04/30/2013 1002   CO2 29 12/24/2018 1413   CO2 29 10/30/2017 1031   BUN 10 12/24/2018 1413   BUN 14.6 10/30/2017 1031   CREATININE 0.65 12/24/2018 1413   CREATININE 0.7 10/30/2017 1031      Component Value Date/Time   CALCIUM 9.3 12/24/2018 1413   CALCIUM 9.2 10/30/2017 1031   ALKPHOS 91 12/24/2018 1413   ALKPHOS 103 10/30/2017 1031   AST 25 12/24/2018 1413   AST 20 10/30/2017 1031   ALT 20 12/24/2018 1413   ALT 14 10/30/2017 1031   BILITOT 0.6 12/24/2018 1413   BILITOT 0.79 10/30/2017 1031       RADIOGRAPHIC STUDIES: Dg Chest 2 View  Result Date: 01/15/2019 CLINICAL DATA:  58 year old female with history of lung carcinoma and wheezing EXAM: CHEST - 2 VIEW COMPARISON:  CT 11/08/2018.  Plain film 10/05/2017 FINDINGS: Cardiomediastinal silhouette unchanged in  size and contour. Persisting opacity at the right lung base with blunting of the right costophrenic angle, right cardiophrenic angle, and the sulcus on the lateral view. No pneumothorax. Left IJ port catheter, terminates superior vena cava. No evidence of left-sided airspace disease IMPRESSION: Persisting right-sided pleural effusion, relatively unchanged over time. Left IJ port catheter. Electronically Signed   By: Corrie Mckusick D.O.   On: 01/15/2019 12:48    ASSESSMENT AND PLAN:  This is a very pleasant 58 years old Asian female with recurrent non-small cell lung cancer, adenocarcinoma status post surgical resection followed by systemic chemotherapy with carboplatin and Alimta followed by disease progression and the patient was started on treatment with Tarceva 150 mg by mouth daily status post 78 months  The patient has been tolerating this treatment well with no concerning complaints. I recommended for her  to proceed with Tarceva with the same dose for now. For the skin rash I will give the patient refill for clindamycin lotion as well as doxycycline 100 mg. For the dry cough she received refill for Hycodan. She is expected to travel to [place] in few days. I will see her back for follow-up visit in 3 months after her return back.  If the patient decided not to travel because of the coronavirus concerns, I will see her back for follow-up visit in 6 weeks. She was advised to call immediately if she has any concerning symptoms in the interval. All questions were answered. The patient knows to call the clinic with any problems, questions or concerns. We can certainly see the patient much sooner if necessary.  Disclaimer: This note was dictated with voice recognition software. Similar sounding words can inadvertently be transcribed and may not be corrected upon review.

## 2019-01-28 NOTE — Telephone Encounter (Signed)
Scheduled appt per 3/2 los. ° °Printed calendar and avs. °

## 2019-01-28 NOTE — Progress Notes (Signed)
[facility] Work  Clinical Social Work met with patient after medical oncology visit today.  Patient reported she's planning for a trip to [place].  Mrs. Fitzwater reported she was able to get necessary shots, although they were very expensive.  CSW provided [employer] gift card to provide gas/food assistance.  CSW encouraged patient to follow up with CSW as needed.  Gwinda Maine, LCSW  Clinical Social Worker [facility]

## 2019-02-08 ENCOUNTER — Telehealth: Payer: Self-pay | Admitting: Internal Medicine

## 2019-02-08 NOTE — Telephone Encounter (Signed)
R/s appt per daughters request - pt no longer leaving the country

## 2019-02-19 ENCOUNTER — Telehealth: Payer: Self-pay

## 2019-02-19 NOTE — Telephone Encounter (Signed)
Oral Oncology Patient Advocate Encounter  Received a request of insurance and income update form from [facility]  I completed this form and faxed it to [phone] today.  Received a successful transaction notice  [place] Patient [place] Phone [phone] Fax [phone] 02/19/2019   8:53 AM

## 2019-03-08 ENCOUNTER — Telehealth: Payer: Self-pay | Admitting: Internal Medicine

## 2019-03-08 NOTE — Telephone Encounter (Signed)
Per reschedule list moved 4/20 f/u to 4/22 as webex - patient will keep 4/20 lab.  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 Appointments remain as originally scheduled for. Message to provider.

## 2019-03-15 ENCOUNTER — Other Ambulatory Visit: Payer: Self-pay | Admitting: *Deleted

## 2019-03-15 DIAGNOSIS — C3431 Malignant neoplasm of lower lobe, right bronchus or lung: Secondary | ICD-10-CM

## 2019-03-18 ENCOUNTER — Other Ambulatory Visit: Payer: Self-pay

## 2019-03-18 ENCOUNTER — Inpatient Hospital Stay: Payer: Medicare Other | Attending: Internal Medicine

## 2019-03-18 ENCOUNTER — Ambulatory Visit: Payer: Self-pay | Admitting: Internal Medicine

## 2019-03-18 ENCOUNTER — Encounter: Payer: Self-pay | Admitting: Internal Medicine

## 2019-03-18 ENCOUNTER — Inpatient Hospital Stay (HOSPITAL_BASED_OUTPATIENT_CLINIC_OR_DEPARTMENT_OTHER): Payer: Medicare Other | Admitting: Internal Medicine

## 2019-03-18 DIAGNOSIS — Z9221 Personal history of antineoplastic chemotherapy: Secondary | ICD-10-CM

## 2019-03-18 DIAGNOSIS — Z79899 Other long term (current) drug therapy: Secondary | ICD-10-CM | POA: Diagnosis not present

## 2019-03-18 DIAGNOSIS — Z923 Personal history of irradiation: Secondary | ICD-10-CM | POA: Diagnosis not present

## 2019-03-18 DIAGNOSIS — Z881 Allergy status to other antibiotic agents status: Secondary | ICD-10-CM

## 2019-03-18 DIAGNOSIS — C3431 Malignant neoplasm of lower lobe, right bronchus or lung: Secondary | ICD-10-CM | POA: Insufficient documentation

## 2019-03-18 DIAGNOSIS — Z885 Allergy status to narcotic agent status: Secondary | ICD-10-CM | POA: Insufficient documentation

## 2019-03-18 DIAGNOSIS — R52 Pain, unspecified: Secondary | ICD-10-CM | POA: Insufficient documentation

## 2019-03-18 DIAGNOSIS — C3491 Malignant neoplasm of unspecified part of right bronchus or lung: Secondary | ICD-10-CM

## 2019-03-18 DIAGNOSIS — R21 Rash and other nonspecific skin eruption: Secondary | ICD-10-CM | POA: Diagnosis not present

## 2019-03-18 DIAGNOSIS — Z95828 Presence of other vascular implants and grafts: Secondary | ICD-10-CM

## 2019-03-18 DIAGNOSIS — C3490 Malignant neoplasm of unspecified part of unspecified bronchus or lung: Secondary | ICD-10-CM

## 2019-03-18 DIAGNOSIS — Z5111 Encounter for antineoplastic chemotherapy: Secondary | ICD-10-CM

## 2019-03-18 LAB — CBC WITH DIFFERENTIAL (CANCER CENTER ONLY)
Abs Immature Granulocytes: 0.02 10*3/uL (ref 0.00–0.07)
Basophils Absolute: 0 10*3/uL (ref 0.0–0.1)
Basophils Relative: 1 %
Eosinophils Absolute: 0.1 10*3/uL (ref 0.0–0.5)
Eosinophils Relative: 1 %
HCT: 41 % (ref 36.0–46.0)
Hemoglobin: 12.9 g/dL (ref 12.0–15.0)
Immature Granulocytes: 0 %
Lymphocytes Relative: 18 %
Lymphs Abs: 1.6 10*3/uL (ref 0.7–4.0)
MCH: 28.2 pg (ref 26.0–34.0)
MCHC: 31.5 g/dL (ref 30.0–36.0)
MCV: 89.7 fL (ref 80.0–100.0)
Monocytes Absolute: 0.5 10*3/uL (ref 0.1–1.0)
Monocytes Relative: 6 %
Neutro Abs: 6.5 10*3/uL (ref 1.7–7.7)
Neutrophils Relative %: 74 %
Platelet Count: 334 10*3/uL (ref 150–400)
RBC: 4.57 MIL/uL (ref 3.87–5.11)
RDW: 13.7 % (ref 11.5–15.5)
WBC Count: 8.7 10*3/uL (ref 4.0–10.5)
nRBC: 0 % (ref 0.0–0.2)

## 2019-03-18 LAB — CMP (CANCER CENTER ONLY)
ALT: 26 U/L (ref 0–44)
AST: 24 U/L (ref 15–41)
Albumin: 3.9 g/dL (ref 3.5–5.0)
Alkaline Phosphatase: 79 U/L (ref 38–126)
Anion gap: 10 (ref 5–15)
BUN: 8 mg/dL (ref 6–20)
CO2: 28 mmol/L (ref 22–32)
Calcium: 9 mg/dL (ref 8.9–10.3)
Chloride: 104 mmol/L (ref 98–111)
Creatinine: 0.71 mg/dL (ref 0.44–1.00)
GFR, Est AFR Am: >60 mL/min (ref >60)
GFR, Estimated: >60 mL/min (ref >60)
Glucose, Bld: 104 mg/dL — ABNORMAL HIGH (ref 70–99)
Potassium: 4.2 mmol/L (ref 3.5–5.1)
Sodium: 142 mmol/L (ref 135–145)
Total Bilirubin: 1 mg/dL (ref 0.3–1.2)
Total Protein: 7.5 g/dL (ref 6.5–8.1)

## 2019-03-18 MED ORDER — HYDROCOD POLST-CPM POLST ER 10-8 MG/5ML PO SUER: ORAL | 0 refills | Status: DC

## 2019-03-18 MED ORDER — CLINDAMYCIN PHOSPHATE 1 % EX LOTN: TOPICAL_LOTION | CUTANEOUS | 0 refills | Status: DC

## 2019-03-18 MED FILL — CLINDAMYCIN PHOSP 1% LOTION: 1 | 23 days supply | Qty: 60 | Fill #0

## 2019-03-18 MED FILL — HYDROCODONE-CHLORPHEN ER SU: 10-8 | 28 days supply | Qty: 280 | Fill #0

## 2019-03-18 NOTE — Progress Notes (Signed)
[facility] Telephone:([phone]) [phone]   Fax:(336) [phone]  OFFICE PROGRESS NOTE  Golden Circle, FNP [address]  [place] [address]  PRINCIPAL DIAGNOSIS: Local recurrence of non-small cell lung cancer, adenocarcinoma, initially diagnosed as stage IB (T2a N0 M0) in March of 2010.   PRIOR THERAPY:  1. Status post right lower lobectomy under the care of Dr. Cyndia Bent on 04/08/2009. 2. Status post palliative radiotherapy to the right lower lobe recurrent lung mass under the care of Dr. Lisbeth Renshaw, completed on June 08, 2011. 3. Systemic chemotherapy with carboplatin for AUC of 5 and Alimta 500 mg/M2 every 3 weeks. She is status post 3 cycles.  CURRENT THERAPY: Tarceva 150 mg by mouth daily, therapy beginning 07/24/2012. Status post approximately 80 months of therapy.   INTERVAL HISTORY: [name] 58 y.o. female returns to the clinic today for follow-up visit accompanied by her daughter.  The patient is feeling fine today with no concerning complaints except for dry cough and skin rash and few areas of the pain.  She denied having any current chest pain, shortness of breath or hemoptysis.  She denied having any fever or chills.  She has no nausea, vomiting, diarrhea or constipation.  She continues to tolerate her treatment with Tarceva fairly well.  MEDICAL HISTORY: Past Medical History:  Diagnosis Date  . Drug-induced skin rash 02/27/2017  . Encounter for antineoplastic chemotherapy 01/19/2016  . History of radiation therapy 05/02/11 to 06/08/11   right lung  . Lung cancer ([facility])    right lower lobe adenocarcinoma    ALLERGIES:  is allergic to codeine and doxycycline.  MEDICATIONS:  Current Outpatient Medications  Medication Sig Dispense Refill  . acetaminophen (TYLENOL) 500 MG tablet Take 500 mg every 6 (six) hours as needed by mouth.    Marland Kitchen albuterol (PROAIR HFA) 108 (90 Base) MCG/ACT inhaler Inhale 1-2 puffs into the lungs every 6 (six) hours as needed for  wheezing or shortness of breath. 1 Inhaler 2  . chlorpheniramine-HYDROcodone (TUSSIONEX) 10-8 MG/5ML SUER TAKE 5 MLS BY MOUTH TWICE DAILY 280 mL 0  . clindamycin (CLEOCIN T) 1 % lotion APPLY TO AFFECTED AREA TWICE A DAY 120 mL 0  . diltiazem (CARDIZEM CD) 120 MG 24 hr capsule Take 1 capsule (120 mg total) by mouth daily. 90 capsule 4  . doxycycline (VIBRA-TABS) 100 MG tablet Take 1 tablet (100 mg total) by mouth 2 (two) times daily. 28 tablet 0  . erlotinib (TARCEVA) 150 MG tablet Take 1 tablet (150 mg total) by mouth daily. Take on an empty stomach at least 1 hour before or 2 hours after food. 30 tablet 11  . lidocaine-prilocaine (EMLA) cream Apply 1 application topically as needed. 30 g 0   No current facility-administered medications for this visit.     SURGICAL HISTORY:  Past Surgical History:  Procedure Laterality Date  . LUNG LOBECTOMY  04/18/2009   RLL  . PORTACATH PLACEMENT  02/29/2012   Procedure: INSERTION PORT-A-CATH;  Surgeon: Nicanor Alcon, MD;  Location: [facility];  Service: Thoracic;  Laterality: Left;  PowerPort 8 F attachable in left internal jugular.    REVIEW OF SYSTEMS:  A comprehensive review of systems was negative except for: Respiratory: positive for cough Integument/breast: positive for rash   PHYSICAL EXAMINATION: General appearance: alert, cooperative and no distress Head: Normocephalic, without obvious abnormality, atraumatic Neck: no adenopathy, no JVD, supple, symmetrical, trachea midline and thyroid not enlarged, symmetric, no tenderness/mass/nodules Lymph  nodes: Cervical, supraclavicular, and axillary nodes normal. Resp: clear to auscultation bilaterally Back: symmetric, no curvature. ROM normal. No CVA tenderness. Cardio: regular rate and rhythm, S1, S2 normal, no murmur, click, rub or gallop GI: soft, non-tender; bowel sounds normal; no masses,  no organomegaly Extremities: extremities normal, atraumatic, no cyanosis or edema  ECOG PERFORMANCE STATUS: 1 -  Symptomatic but completely ambulatory  Blood pressure 134/90, pulse 97, temperature 98.9 F (37.2 C), temperature source Oral, resp. rate 20, height 4\' 9"  (1.448 m), weight 125 lb 6.4 oz (56.9 kg), last menstrual period 02/10/2012, SpO2 99 %.  LABORATORY DATA: Lab Results  Component Value Date   WBC 8.7 03/18/2019   HGB 12.9 03/18/2019   HCT 41.0 03/18/2019   MCV 89.7 03/18/2019   PLT 334 03/18/2019      Chemistry      Component Value Date/Time   NA 141 01/28/2019 1102   NA 143 10/30/2017 1031   K 4.0 01/28/2019 1102   K 4.3 10/30/2017 1031   CL 106 01/28/2019 1102   CL 102 04/30/2013 1002   CO2 27 01/28/2019 1102   CO2 29 10/30/2017 1031   BUN 11 01/28/2019 1102   BUN 14.6 10/30/2017 1031   CREATININE 0.60 01/28/2019 1102   CREATININE 0.7 10/30/2017 1031      Component Value Date/Time   CALCIUM 8.3 (L) 01/28/2019 1102   CALCIUM 9.2 10/30/2017 1031   ALKPHOS 91 01/28/2019 1102   ALKPHOS 103 10/30/2017 1031   AST 23 01/28/2019 1102   AST 20 10/30/2017 1031   ALT 18 01/28/2019 1102   ALT 14 10/30/2017 1031   BILITOT 0.6 01/28/2019 1102   BILITOT 0.79 10/30/2017 1031       RADIOGRAPHIC STUDIES: No results found.  ASSESSMENT AND PLAN:  This is a very pleasant 58 years old Asian female with recurrent non-small cell lung cancer, adenocarcinoma status post surgical resection followed by systemic chemotherapy with carboplatin and Alimta followed by disease progression and the patient was started on treatment with Tarceva 150 mg by mouth daily status post 80 months  The patient continues to tolerate this treatment.  Mild skin rash. I recommended for her to continue with the same treatment with the same dose of Tarceva. I will see him back for follow-up visit in 2 months for evaluation with repeat CT scan of the chest for restaging of her disease. For the dry cough she received refill for Hycodan. For the skin rash, I will give her a refill for clindamycin lotion.   Patient was advised to call immediately if she has any concerning symptoms. All questions were answered. The patient knows to call the clinic with any problems, questions or concerns. We can certainly see the patient much sooner if necessary.  Disclaimer: This note was dictated with voice recognition software. Similar sounding words can inadvertently be transcribed and may not be corrected upon review.

## 2019-03-19 ENCOUNTER — Telehealth: Payer: Self-pay | Admitting: Internal Medicine

## 2019-03-19 NOTE — Telephone Encounter (Signed)
Called regarding june

## 2019-03-20 ENCOUNTER — Ambulatory Visit: Payer: Self-pay | Admitting: Internal Medicine

## 2019-04-29 ENCOUNTER — Ambulatory Visit: Payer: Self-pay | Admitting: Internal Medicine

## 2019-04-29 ENCOUNTER — Other Ambulatory Visit: Payer: Self-pay

## 2019-04-30 ENCOUNTER — Other Ambulatory Visit: Payer: Self-pay | Admitting: Internal Medicine

## 2019-04-30 ENCOUNTER — Telehealth: Payer: Self-pay | Admitting: *Deleted

## 2019-04-30 DIAGNOSIS — C3431 Malignant neoplasm of lower lobe, right bronchus or lung: Secondary | ICD-10-CM

## 2019-04-30 DIAGNOSIS — Z5111 Encounter for antineoplastic chemotherapy: Secondary | ICD-10-CM

## 2019-04-30 DIAGNOSIS — C3491 Malignant neoplasm of unspecified part of right bronchus or lung: Secondary | ICD-10-CM

## 2019-04-30 DIAGNOSIS — Z95828 Presence of other vascular implants and grafts: Secondary | ICD-10-CM

## 2019-04-30 MED ORDER — HYDROCOD POLST-CPM POLST ER 10-8 MG/5ML PO SUER: ORAL | 0 refills | Status: DC

## 2019-04-30 MED FILL — HYDROCODONE-CHLORPHEN ER SU: 10-8 | 28 days supply | Qty: 280 | Fill #0

## 2019-04-30 NOTE — Telephone Encounter (Signed)
Per pt's daughter-pt needs refill of her cough medication. Last filled  03/18/19 @ [facility]

## 2019-05-17 ENCOUNTER — Inpatient Hospital Stay: Payer: Medicare Other | Attending: Internal Medicine

## 2019-05-17 ENCOUNTER — Other Ambulatory Visit: Payer: Self-pay

## 2019-05-17 ENCOUNTER — Ambulatory Visit (HOSPITAL_COMMUNITY)
Admission: RE | Admit: 2019-05-17 | Discharge: 2019-05-17 | Disposition: A | Discharge status: Home / Self Care | Payer: Medicare Other | Source: Ambulatory Visit | Attending: Internal Medicine | Admitting: Internal Medicine

## 2019-05-17 DIAGNOSIS — R05 Cough: Secondary | ICD-10-CM | POA: Diagnosis not present

## 2019-05-17 DIAGNOSIS — C3490 Malignant neoplasm of unspecified part of unspecified bronchus or lung: Secondary | ICD-10-CM | POA: Insufficient documentation

## 2019-05-17 DIAGNOSIS — C3431 Malignant neoplasm of lower lobe, right bronchus or lung: Secondary | ICD-10-CM | POA: Diagnosis not present

## 2019-05-17 DIAGNOSIS — I10 Essential (primary) hypertension: Secondary | ICD-10-CM | POA: Insufficient documentation

## 2019-05-17 DIAGNOSIS — L270 Generalized skin eruption due to drugs and medicaments taken internally: Secondary | ICD-10-CM | POA: Insufficient documentation

## 2019-05-17 LAB — CBC WITH DIFFERENTIAL (CANCER CENTER ONLY)
Abs Immature Granulocytes: 0.02 10*3/uL (ref 0.00–0.07)
Basophils Absolute: 0 10*3/uL (ref 0.0–0.1)
Basophils Relative: 0 %
Eosinophils Absolute: 0.1 10*3/uL (ref 0.0–0.5)
Eosinophils Relative: 1 %
HCT: 45 % (ref 36.0–46.0)
Hemoglobin: 13.9 g/dL (ref 12.0–15.0)
Immature Granulocytes: 0 %
Lymphocytes Relative: 20 %
Lymphs Abs: 1.6 10*3/uL (ref 0.7–4.0)
MCH: 28.1 pg (ref 26.0–34.0)
MCHC: 30.9 g/dL (ref 30.0–36.0)
MCV: 90.9 fL (ref 80.0–100.0)
Monocytes Absolute: 0.7 10*3/uL (ref 0.1–1.0)
Monocytes Relative: 9 %
Neutro Abs: 5.7 10*3/uL (ref 1.7–7.7)
Neutrophils Relative %: 70 %
Platelet Count: 326 10*3/uL (ref 150–400)
RBC: 4.95 MIL/uL (ref 3.87–5.11)
RDW: 13.8 % (ref 11.5–15.5)
WBC Count: 8.2 10*3/uL (ref 4.0–10.5)
nRBC: 0 % (ref 0.0–0.2)

## 2019-05-17 LAB — CMP (CANCER CENTER ONLY)
ALT: 27 U/L (ref 0–44)
AST: 28 U/L (ref 15–41)
Albumin: 4 g/dL (ref 3.5–5.0)
Alkaline Phosphatase: 85 U/L (ref 38–126)
Anion gap: 10 (ref 5–15)
BUN: 11 mg/dL (ref 6–20)
CO2: 31 mmol/L (ref 22–32)
Calcium: 9.6 mg/dL (ref 8.9–10.3)
Chloride: 101 mmol/L (ref 98–111)
Creatinine: 0.74 mg/dL (ref 0.44–1.00)
GFR, Est AFR Am: >60 mL/min (ref >60)
GFR, Est Non Af Am: >60 mL/min (ref >60)
Glucose, Bld: 98 mg/dL (ref 70–99)
Potassium: 5.2 mmol/L — ABNORMAL HIGH (ref 3.5–5.1)
Sodium: 142 mmol/L (ref 135–145)
Total Bilirubin: 0.6 mg/dL (ref 0.3–1.2)
Total Protein: 7.8 g/dL (ref 6.5–8.1)

## 2019-05-17 MED ORDER — HEPARIN SOD (PORK) LOCK FLUSH 100 UNIT/ML IV SOLN
INTRAVENOUS | Status: AC
  Filled 2019-05-17: qty 5

## 2019-05-17 MED ORDER — SODIUM CHLORIDE (PF) 0.9 % IJ SOLN
INTRAMUSCULAR | Status: AC
  Filled 2019-05-17: qty 50

## 2019-05-17 MED ORDER — IOHEXOL 300 MG/ML  SOLN
75.0000 mL | Freq: Once | INTRAMUSCULAR | Status: AC | PRN
  Administered 2019-05-17: 75 mL via INTRAVENOUS

## 2019-05-21 ENCOUNTER — Other Ambulatory Visit: Payer: Self-pay

## 2019-05-21 ENCOUNTER — Encounter: Payer: Self-pay | Admitting: Internal Medicine

## 2019-05-21 ENCOUNTER — Telehealth: Payer: Self-pay | Admitting: Internal Medicine

## 2019-05-21 ENCOUNTER — Inpatient Hospital Stay (HOSPITAL_BASED_OUTPATIENT_CLINIC_OR_DEPARTMENT_OTHER): Payer: Medicare Other | Admitting: Internal Medicine

## 2019-05-21 VITALS — BP 129/85 | HR 104 | Temp 98.7°F | Resp 18 | Ht <= 58 in | Wt 129.6 lb

## 2019-05-21 DIAGNOSIS — R05 Cough: Secondary | ICD-10-CM | POA: Diagnosis not present

## 2019-05-21 DIAGNOSIS — L270 Generalized skin eruption due to drugs and medicaments taken internally: Secondary | ICD-10-CM | POA: Diagnosis not present

## 2019-05-21 DIAGNOSIS — C3491 Malignant neoplasm of unspecified part of right bronchus or lung: Secondary | ICD-10-CM

## 2019-05-21 DIAGNOSIS — C3431 Malignant neoplasm of lower lobe, right bronchus or lung: Secondary | ICD-10-CM | POA: Diagnosis not present

## 2019-05-21 DIAGNOSIS — I10 Essential (primary) hypertension: Secondary | ICD-10-CM

## 2019-05-21 DIAGNOSIS — Z5111 Encounter for antineoplastic chemotherapy: Secondary | ICD-10-CM

## 2019-05-21 DIAGNOSIS — Z95828 Presence of other vascular implants and grafts: Secondary | ICD-10-CM

## 2019-05-21 MED ORDER — HYDROCOD POLST-CPM POLST ER 10-8 MG/5ML PO SUER: ORAL | 0 refills | Status: DC

## 2019-05-21 NOTE — Telephone Encounter (Signed)
Scheduled appt per 6/23 los. Printed calendar and avs.

## 2019-05-21 NOTE — Progress Notes (Signed)
[facility] [phone]   [phone]  OFFICE PROGRESS NOTE  Golden Circle, FNP [address] [place] [address]  PRINCIPAL DIAGNOSIS: Local recurrence of non-small cell lung cancer, adenocarcinoma, initially diagnosed as stage IB (T2a N0 M0) in March of 2010.   PRIOR THERAPY:  1. Status post right lower lobectomy under the care of Dr. Cyndia Bent on 04/08/2009. 2. Status post palliative radiotherapy to the right lower lobe recurrent lung mass under the care of Dr. Lisbeth Renshaw, completed on June 08, 2011. 3. Systemic chemotherapy with carboplatin for AUC of 5 and Alimta 500 mg/M2 every 3 weeks. She is status post 3 cycles.  CURRENT THERAPY: Tarceva 150 mg by mouth daily, therapy beginning 07/24/2012. Status post approximately 82 months of therapy.   INTERVAL HISTORY: [name] 58 y.o. female returns to the clinic today for follow-up visit accompanied by her interpreter.  The patient is feeling fine today with no concerning complaints except for the rash in the yard that was as well as intermittent congestion and crust formation in the morning.  She denied having any current chest pain, shortness of breath but continues to have dry cough with no hemoptysis.  The patient denied having any weight loss or night sweats.  She has no nausea, vomiting but has few episodes of diarrhea.  She continues to tolerate her treatment with Tarceva fairly well.  She is here today for evaluation with repeat CT scan of the chest for restaging of her disease.  MEDICAL HISTORY: Past Medical History:  Diagnosis Date  . Drug-induced skin rash 02/27/2017  . Encounter for antineoplastic chemotherapy 01/19/2016  . History of radiation therapy 05/02/11 to 06/08/11   right lung  . Lung cancer ([facility])    right lower lobe adenocarcinoma    ALLERGIES:  is allergic to codeine and doxycycline.  MEDICATIONS:  Current Outpatient Medications  Medication Sig Dispense Refill  .  acetaminophen (TYLENOL) 500 MG tablet Take 500 mg every 6 (six) hours as needed by mouth.    Marland Kitchen albuterol (PROAIR HFA) 108 (90 Base) MCG/ACT inhaler Inhale 1-2 puffs into the lungs every 6 (six) hours as needed for wheezing or shortness of breath. 1 Inhaler 2  . chlorpheniramine-HYDROcodone (TUSSIONEX) 10-8 MG/5ML SUER TAKE 5 MLS BY MOUTH TWICE DAILY 280 mL 0  . clindamycin (CLEOCIN T) 1 % lotion APPLY TO AFFECTED AREA TWICE A DAY 120 mL 0  . diltiazem (CARDIZEM CD) 120 MG 24 hr capsule Take 1 capsule (120 mg total) by mouth daily. 90 capsule 4  . doxycycline (VIBRA-TABS) 100 MG tablet Take 1 tablet (100 mg total) by mouth 2 (two) times daily. 28 tablet 0  . erlotinib (TARCEVA) 150 MG tablet Take 1 tablet (150 mg total) by mouth daily. Take on an empty stomach at least 1 hour before or 2 hours after food. 30 tablet 11  . lidocaine-prilocaine (EMLA) cream Apply 1 application topically as needed. 30 g 0   No current facility-administered medications for this visit.     SURGICAL HISTORY:  Past Surgical History:  Procedure Laterality Date  . LUNG LOBECTOMY  04/18/2009   RLL  . PORTACATH PLACEMENT  02/29/2012   Procedure: INSERTION PORT-A-CATH;  Surgeon: Nicanor Alcon, MD;  Location: [facility];  Service: Thoracic;  Laterality: Left;  PowerPort 8 F attachable in left internal jugular.    REVIEW OF SYSTEMS:  Constitutional: positive for fatigue Eyes: negative Ears, nose, mouth, throat, and face:  negative Respiratory: positive for cough Cardiovascular: negative Gastrointestinal: negative Genitourinary:negative Integument/breast: negative Hematologic/lymphatic: negative Musculoskeletal:negative Neurological: negative Behavioral/Psych: negative Endocrine: negative Allergic/Immunologic: negative   PHYSICAL EXAMINATION: General appearance: alert, cooperative and no distress Head: Normocephalic, without obvious abnormality, atraumatic Neck: no adenopathy, no JVD, supple, symmetrical, trachea  midline and thyroid not enlarged, symmetric, no tenderness/mass/nodules Lymph nodes: Cervical, supraclavicular, and axillary nodes normal. Resp: clear to auscultation bilaterally Back: symmetric, no curvature. ROM normal. No CVA tenderness. Cardio: regular rate and rhythm, S1, S2 normal, no murmur, click, rub or gallop GI: soft, non-tender; bowel sounds normal; no masses,  no organomegaly Extremities: extremities normal, atraumatic, no cyanosis or edema Neurologic: Alert and oriented X 3, normal strength and tone. Normal symmetric reflexes. Normal coordination and gait  ECOG PERFORMANCE STATUS: 1 - Symptomatic but completely ambulatory  Blood pressure 129/85, pulse (!) 104, temperature 98.7 F (37.1 C), resp. rate 18, height 4\' 9"  (1.448 m), weight 129 lb 9.6 oz (58.8 kg), last menstrual period 02/10/2012, SpO2 95 %.  LABORATORY DATA: Lab Results  Component Value Date   WBC 8.2 05/17/2019   HGB 13.9 05/17/2019   HCT 45.0 05/17/2019   MCV 90.9 05/17/2019   PLT 326 05/17/2019      Chemistry      Component Value Date/Time   NA 142 05/17/2019 1000   NA 143 10/30/2017 1031   K 5.2 (H) 05/17/2019 1000   K 4.3 10/30/2017 1031   CL 101 05/17/2019 1000   CL 102 04/30/2013 1002   CO2 31 05/17/2019 1000   CO2 29 10/30/2017 1031   BUN 11 05/17/2019 1000   BUN 14.6 10/30/2017 1031   CREATININE 0.74 05/17/2019 1000   CREATININE 0.7 10/30/2017 1031      Component Value Date/Time   CALCIUM 9.6 05/17/2019 1000   CALCIUM 9.2 10/30/2017 1031   ALKPHOS 85 05/17/2019 1000   ALKPHOS 103 10/30/2017 1031   AST 28 05/17/2019 1000   AST 20 10/30/2017 1031   ALT 27 05/17/2019 1000   ALT 14 10/30/2017 1031   BILITOT 0.6 05/17/2019 1000   BILITOT 0.79 10/30/2017 1031       RADIOGRAPHIC STUDIES: Ct Chest W Contrast  Result Date: 05/17/2019 CLINICAL DATA:  Follow-up lung cancer. Status post right lower lobectomy. EXAM: CT CHEST WITH CONTRAST TECHNIQUE: Multidetector CT imaging of the chest  was performed during intravenous contrast administration. CONTRAST:  27mL OMNIPAQUE IOHEXOL 300 MG/ML  SOLN COMPARISON:  11/08/2018 FINDINGS: Cardiovascular: Heart size appears within normal limits. No pericardial effusion. Mediastinum/Nodes: 1.3 cm right lobe of thyroid gland nodule. Unchanged. The trachea appears patent and is midline. Normal appearance of the esophagus. No enlarged mediastinal or hilar lymph nodes. Lungs/Pleura: Small right pleural effusion, status post right lower lobectomy. Small loculated right pleural effusion, unchanged. Hyperdense area of masslike architectural distortion within the perihilar right lung is again noted. On today's study this measures 3.2 by 2.4 cm, image 71/2. Previously 3.8 x 2.7 cm. Adjacent perihilar fibrosis in head geographic distribution is noted compatible with changes of external beam radiation. Just lateral to changes of radiation is a 8 mm nodule, image 56/5, nonspecific. In retrospect, seen on the coronal images of the previous exam was a tiny 3 mm nodule in this area. Sub solid 4 mm right upper lobe lung nodule is stable from previous exam, image 55/5. Unchanged small solid nodule in the right upper lobe measuring 3 mm. Upper Abdomen: No acute abnormality identified. The adrenal glands appear within normal limits. Musculoskeletal: Right posterior  sixth and seventh rib deformities are identified and appear unchanged from previous exam. No acute or suspicious bone abnormality. IMPRESSION: 1. There is a 8 mm lung nodule within the right upper lobe adjacent to radiation port. This is nonspecific but warrant short-term interval follow-up. Upon review of study from 11/08/2018 there may have been a tiny 3 mm nodule in this area. Suggest repeat CT of the chest in 3 months to assess for temporal change in the appearance of this nodule. If persistent or increased in size consider further evaluation with PET-CT. 2. Stable radiation change and hyperdense area of masslike  architectural distortion within the central right lower lung. Electronically Signed   By: Kerby Moors M.D.   On: 05/17/2019 13:40    ASSESSMENT AND PLAN:  This is a very pleasant 58 years old Asian female with recurrent non-small cell lung cancer, adenocarcinoma status post surgical resection followed by systemic chemotherapy with carboplatin and Alimta followed by disease progression and the patient was started on treatment with Tarceva 150 mg by mouth daily status post 82 months  The patient has been tolerating this treatment well with no concerning adverse effects. She had repeat CT scan of the chest performed recently.  I personally and independently reviewed the scan images and discussed the results with the patient today. Her scan showed no concerning findings for disease progression except for an 8 mm nodule in the right upper lobe adjacent to the radiation portal nonspecific but require close follow-up on the upcoming imaging studies. I recommended for the patient to continue her current treatment with Tarceva with the same dose for now. For the dry cough she received refill for Hycodan. For the skin rash, I will give her a refill for clindamycin lotion.   I will see her back for follow-up visit in 6 weeks for evaluation with repeat blood work. The patient was advised to call immediately if she has any concerning symptoms in the interval. All questions were answered. The patient knows to call the clinic with any problems, questions or concerns. We can certainly see the patient much sooner if necessary.  Disclaimer: This note was dictated with voice recognition software. Similar sounding words can inadvertently be transcribed and may not be corrected upon review.

## 2019-05-22 ENCOUNTER — Encounter: Payer: Self-pay | Admitting: General Practice

## 2019-05-22 NOTE — Progress Notes (Signed)
[place] CSW Progress NOtes  Funds from [employer] disbursed to daughter [place] per request of CSW [place].  Edwyna Shell, LCSW Clinical Social Worker Phone:  [phone]

## 2019-06-11 MED FILL — HYDROCODONE-CHLORPHEN ER SU: 10-8 | 28 days supply | Qty: 280 | Fill #0

## 2019-06-24 ENCOUNTER — Telehealth: Payer: Self-pay | Admitting: Physician Assistant

## 2019-06-26 NOTE — Telephone Encounter (Signed)
No message needed °

## 2019-07-01 ENCOUNTER — Other Ambulatory Visit: Payer: Self-pay

## 2019-07-01 ENCOUNTER — Other Ambulatory Visit: Payer: Self-pay | Admitting: Medical Oncology

## 2019-07-01 ENCOUNTER — Inpatient Hospital Stay (HOSPITAL_BASED_OUTPATIENT_CLINIC_OR_DEPARTMENT_OTHER): Payer: Medicare Other | Admitting: Internal Medicine

## 2019-07-01 ENCOUNTER — Inpatient Hospital Stay: Payer: Medicare Other | Attending: Internal Medicine

## 2019-07-01 ENCOUNTER — Telehealth: Payer: Self-pay | Admitting: Internal Medicine

## 2019-07-01 ENCOUNTER — Encounter: Payer: Self-pay | Admitting: Internal Medicine

## 2019-07-01 VITALS — BP 131/85 | HR 98 | Temp 98.5°F | Resp 18 | Ht <= 58 in | Wt 132.0 lb

## 2019-07-01 DIAGNOSIS — R05 Cough: Secondary | ICD-10-CM | POA: Insufficient documentation

## 2019-07-01 DIAGNOSIS — I10 Essential (primary) hypertension: Secondary | ICD-10-CM

## 2019-07-01 DIAGNOSIS — Z5111 Encounter for antineoplastic chemotherapy: Secondary | ICD-10-CM

## 2019-07-01 DIAGNOSIS — C3431 Malignant neoplasm of lower lobe, right bronchus or lung: Secondary | ICD-10-CM

## 2019-07-01 DIAGNOSIS — C3491 Malignant neoplasm of unspecified part of right bronchus or lung: Secondary | ICD-10-CM | POA: Diagnosis not present

## 2019-07-01 DIAGNOSIS — R21 Rash and other nonspecific skin eruption: Secondary | ICD-10-CM | POA: Insufficient documentation

## 2019-07-01 DIAGNOSIS — I498 Other specified cardiac arrhythmias: Secondary | ICD-10-CM | POA: Diagnosis not present

## 2019-07-01 DIAGNOSIS — L270 Generalized skin eruption due to drugs and medicaments taken internally: Secondary | ICD-10-CM | POA: Diagnosis not present

## 2019-07-01 DIAGNOSIS — Z95828 Presence of other vascular implants and grafts: Secondary | ICD-10-CM

## 2019-07-01 DIAGNOSIS — R000 Tachycardia, unspecified: Secondary | ICD-10-CM | POA: Insufficient documentation

## 2019-07-01 LAB — CBC WITH DIFFERENTIAL (CANCER CENTER ONLY)
Abs Immature Granulocytes: 0.02 10*3/uL (ref 0.00–0.07)
Basophils Absolute: 0 10*3/uL (ref 0.0–0.1)
Basophils Relative: 0 %
Eosinophils Absolute: 0.1 10*3/uL (ref 0.0–0.5)
Eosinophils Relative: 2 %
HCT: 39.8 % (ref 36.0–46.0)
Hemoglobin: 12.5 g/dL (ref 12.0–15.0)
Immature Granulocytes: 0 %
Lymphocytes Relative: 20 %
Lymphs Abs: 1.8 10*3/uL (ref 0.7–4.0)
MCH: 28.3 pg (ref 26.0–34.0)
MCHC: 31.4 g/dL (ref 30.0–36.0)
MCV: 90.2 fL (ref 80.0–100.0)
Monocytes Absolute: 0.8 10*3/uL (ref 0.1–1.0)
Monocytes Relative: 9 %
Neutro Abs: 6.3 10*3/uL (ref 1.7–7.7)
Neutrophils Relative %: 69 %
Platelet Count: 343 10*3/uL (ref 150–400)
RBC: 4.41 MIL/uL (ref 3.87–5.11)
RDW: 14.1 % (ref 11.5–15.5)
WBC Count: 9.1 10*3/uL (ref 4.0–10.5)
nRBC: 0 % (ref 0.0–0.2)

## 2019-07-01 LAB — CMP (CANCER CENTER ONLY)
ALT: 29 U/L (ref 0–44)
AST: 26 U/L (ref 15–41)
Albumin: 3.8 g/dL (ref 3.5–5.0)
Alkaline Phosphatase: 91 U/L (ref 38–126)
Anion gap: 8 (ref 5–15)
BUN: 8 mg/dL (ref 6–20)
CO2: 28 mmol/L (ref 22–32)
Calcium: 9.1 mg/dL (ref 8.9–10.3)
Chloride: 103 mmol/L (ref 98–111)
Creatinine: 0.68 mg/dL (ref 0.44–1.00)
GFR, Est AFR Am: >60 mL/min (ref >60)
GFR, Estimated: >60 mL/min (ref >60)
Glucose, Bld: 102 mg/dL — ABNORMAL HIGH (ref 70–99)
Potassium: 4.5 mmol/L (ref 3.5–5.1)
Sodium: 139 mmol/L (ref 135–145)
Total Bilirubin: 0.9 mg/dL (ref 0.3–1.2)
Total Protein: 7.4 g/dL (ref 6.5–8.1)

## 2019-07-01 MED ORDER — CLINDAMYCIN PHOSPHATE 1 % EX LOTN: TOPICAL_LOTION | CUTANEOUS | 0 refills | Status: DC

## 2019-07-01 MED ORDER — HYDROCOD POLST-CPM POLST ER 10-8 MG/5ML PO SUER: ORAL | 0 refills | Status: DC

## 2019-07-01 NOTE — Telephone Encounter (Signed)
Gave avs and calendar ° °

## 2019-07-01 NOTE — Progress Notes (Signed)
[facility] [phone]   Fax:(336) [phone]  OFFICE PROGRESS NOTE  Golden Circle, FNP [address] [place] [address]  PRINCIPAL DIAGNOSIS: Local recurrence of non-small cell lung cancer, adenocarcinoma, initially diagnosed as stage IB (T2a N0 M0) in March of 2010.   PRIOR THERAPY:  1. Status post right lower lobectomy under the care of Dr. Cyndia Bent on 04/08/2009. 2. Status post palliative radiotherapy to the right lower lobe recurrent lung mass under the care of Dr. Lisbeth Renshaw, completed on June 08, 2011. 3. Systemic chemotherapy with carboplatin for AUC of 5 and Alimta 500 mg/M2 every 3 weeks. She is status post 3 cycles.  CURRENT THERAPY: Tarceva 150 mg by mouth daily, therapy beginning 07/24/2012. Status post approximately 84 months of therapy.   INTERVAL HISTORY: [name] 58 y.o. female returns to the clinic today for follow-up visit accompanied by her [place] interpreter.  The patient is feeling fine except for dryness of her skin and breaks in the tip of the fingers.  She also continues to have rash in the eyebrows.  She denied having any diarrhea.  She continues to have dry cough and requesting refill of her cough medication.  She denied having any chest pain, shortness of breath or hemoptysis.  She denied having any fever or chills.  She has no nausea, vomiting, diarrhea or constipation.  The patient denied having any headache or visual changes.  She was seen recently at the emergency department with tachycardia and started on treatment with diltiazem and she is scheduled to see cardiology next week. She is here today for evaluation and repeat blood work.  MEDICAL HISTORY: Past Medical History:  Diagnosis Date  . Drug-induced skin rash 02/27/2017  . Encounter for antineoplastic chemotherapy 01/19/2016  . History of radiation therapy 05/02/11 to 06/08/11   right lung  . Lung cancer ([facility])    right lower lobe adenocarcinoma     ALLERGIES:  is allergic to codeine and doxycycline.  MEDICATIONS:  Current Outpatient Medications  Medication Sig Dispense Refill  . acetaminophen (TYLENOL) 500 MG tablet Take 500 mg every 6 (six) hours as needed by mouth.    Marland Kitchen albuterol (PROAIR HFA) 108 (90 Base) MCG/ACT inhaler Inhale 1-2 puffs into the lungs every 6 (six) hours as needed for wheezing or shortness of breath. 1 Inhaler 2  . chlorpheniramine-HYDROcodone (TUSSIONEX) 10-8 MG/5ML SUER TAKE 5 MLS BY MOUTH TWICE DAILY 280 mL 0  . clindamycin (CLEOCIN T) 1 % lotion APPLY TO AFFECTED AREA TWICE A DAY 120 mL 0  . diltiazem (CARDIZEM CD) 120 MG 24 hr capsule Take 1 capsule (120 mg total) by mouth daily. 90 capsule 4  . doxycycline (VIBRA-TABS) 100 MG tablet Take 1 tablet (100 mg total) by mouth 2 (two) times daily. 28 tablet 0  . erlotinib (TARCEVA) 150 MG tablet Take 1 tablet (150 mg total) by mouth daily. Take on an empty stomach at least 1 hour before or 2 hours after food. 30 tablet 11  . lidocaine-prilocaine (EMLA) cream Apply 1 application topically as needed. 30 g 0   No current facility-administered medications for this visit.     SURGICAL HISTORY:  Past Surgical History:  Procedure Laterality Date  . LUNG LOBECTOMY  04/18/2009   RLL  . PORTACATH PLACEMENT  02/29/2012   Procedure: INSERTION PORT-A-CATH;  Surgeon: Nicanor Alcon, MD;  Location: [facility] OR;  Service: Thoracic;  Laterality: Left;  PowerPort 8 F attachable in  left internal jugular.    REVIEW OF SYSTEMS:  A comprehensive review of systems was negative except for: Respiratory: positive for cough Integument/breast: positive for dryness   PHYSICAL EXAMINATION: General appearance: alert, cooperative and no distress Head: Normocephalic, without obvious abnormality, atraumatic Neck: no adenopathy, no JVD, supple, symmetrical, trachea midline and thyroid not enlarged, symmetric, no tenderness/mass/nodules Lymph nodes: Cervical, supraclavicular, and axillary nodes  normal. Resp: clear to auscultation bilaterally Back: symmetric, no curvature. ROM normal. No CVA tenderness. Cardio: regular rate and rhythm, S1, S2 normal, no murmur, click, rub or gallop GI: soft, non-tender; bowel sounds normal; no masses,  no organomegaly Extremities: extremities normal, atraumatic, no cyanosis or edema  ECOG PERFORMANCE STATUS: 1 - Symptomatic but completely ambulatory  Blood pressure 131/85, pulse 98, temperature 98.5 F (36.9 C), temperature source Oral, resp. rate 18, height 4\' 9"  (1.448 m), weight 132 lb (59.9 kg), last menstrual period 02/10/2012, SpO2 99 %.  LABORATORY DATA: Lab Results  Component Value Date   WBC 9.1 07/01/2019   HGB 12.5 07/01/2019   HCT 39.8 07/01/2019   MCV 90.2 07/01/2019   PLT 343 07/01/2019      Chemistry      Component Value Date/Time   NA 142 05/17/2019 1000   NA 143 10/30/2017 1031   K 5.2 (H) 05/17/2019 1000   K 4.3 10/30/2017 1031   CL 101 05/17/2019 1000   CL 102 04/30/2013 1002   CO2 31 05/17/2019 1000   CO2 29 10/30/2017 1031   BUN 11 05/17/2019 1000   BUN 14.6 10/30/2017 1031   CREATININE 0.74 05/17/2019 1000   CREATININE 0.7 10/30/2017 1031      Component Value Date/Time   CALCIUM 9.6 05/17/2019 1000   CALCIUM 9.2 10/30/2017 1031   ALKPHOS 85 05/17/2019 1000   ALKPHOS 103 10/30/2017 1031   AST 28 05/17/2019 1000   AST 20 10/30/2017 1031   ALT 27 05/17/2019 1000   ALT 14 10/30/2017 1031   BILITOT 0.6 05/17/2019 1000   BILITOT 0.79 10/30/2017 1031       RADIOGRAPHIC STUDIES: No results found.  ASSESSMENT AND PLAN:  This is a very pleasant 58 years old Asian female with recurrent non-small cell lung cancer, adenocarcinoma status post surgical resection followed by systemic chemotherapy with carboplatin and Alimta followed by disease progression and the patient was started on treatment with Tarceva 150 mg by mouth daily status post 84 months  The patient has been tolerating this treatment well except  for the baseline adverse effect including mild skin rash especially on the eyebrows as well as dry skin. I recommended for her to continue her current treatment with Tarceva with the same dose. For the tachycardia, she is followed by cardiology and currently on diltiazem. For the dry cough she received refill for Hycodan. For the skin rash, I will give her a refill for clindamycin lotion.   She will come back for follow-up visit in 6 weeks for evaluation with repeat blood work. All questions were answered. The patient knows to call the clinic with any problems, questions or concerns. We can certainly see the patient much sooner if necessary.  Disclaimer: This note was dictated with voice recognition software. Similar sounding words can inadvertently be transcribed and may not be corrected upon review.
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
## 2019-07-04 ENCOUNTER — Ambulatory Visit (INDEPENDENT_AMBULATORY_CARE_PROVIDER_SITE_OTHER): Payer: Medicare Other | Admitting: Physician Assistant

## 2019-07-04 ENCOUNTER — Other Ambulatory Visit: Payer: Self-pay

## 2019-07-04 ENCOUNTER — Encounter: Payer: Self-pay | Admitting: Physician Assistant

## 2019-07-04 VITALS — BP 122/89 | HR 99 | Ht <= 58 in | Wt 132.0 lb

## 2019-07-04 DIAGNOSIS — I471 Supraventricular tachycardia: Secondary | ICD-10-CM | POA: Diagnosis not present

## 2019-07-04 NOTE — Patient Instructions (Addendum)
Medication Instructions:  Your physician recommends that you continue on your current medications as directed. Please refer to the Current Medication list given to you today.  If you need a refill on your cardiac medications before your next appointment, please call your pharmacy.   Lab work: NONE ORDERED  TODAY  If you have labs (blood work) drawn today and your tests are completely normal, you will receive your results only by: Marland Kitchen MyChart Message (if you have MyChart) OR . A paper copy in the mail If you have any lab test that is abnormal or we need to change your treatment, we will call you to review the results.  Testing/Procedures: NONE ORDERED  TODAY  Follow-Up: At [facility], you and your health needs are our priority.  As part of our continuing mission to provide you with exceptional heart care, we have created designated Provider Care Teams.  These Care Teams include your primary Cardiologist (physician) and Advanced Practice Providers (APPs -  Physician Assistants and Nurse Practitioners) who all work together to provide you with the care you need, when you need it. You will need a follow up appointment in 3  months.  Please call our office 2 months in advance to schedule this appointment.  You may seethe following Advanced Practice Providers on your designated Care Team:   . Tommye Standard, PA-C  Any Other Special Instructions Will Be Listed Below (If Applicable).

## 2019-07-12 ENCOUNTER — Other Ambulatory Visit: Payer: Self-pay | Admitting: Internal Medicine

## 2019-07-12 DIAGNOSIS — C3431 Malignant neoplasm of lower lobe, right bronchus or lung: Secondary | ICD-10-CM

## 2019-07-12 DIAGNOSIS — Z5111 Encounter for antineoplastic chemotherapy: Secondary | ICD-10-CM

## 2019-07-12 MED FILL — HYDROCODONE-CHLORPHEN ER SU: 10-8 | 28 days supply | Qty: 280 | Fill #0

## 2019-08-12 ENCOUNTER — Telehealth: Payer: Self-pay | Admitting: Physician Assistant

## 2019-08-12 ENCOUNTER — Other Ambulatory Visit: Payer: Self-pay

## 2019-08-12 ENCOUNTER — Inpatient Hospital Stay: Payer: Medicare Other | Attending: Internal Medicine | Admitting: Physician Assistant

## 2019-08-12 ENCOUNTER — Inpatient Hospital Stay: Payer: Medicare Other

## 2019-08-12 ENCOUNTER — Encounter: Payer: Self-pay | Admitting: Physician Assistant

## 2019-08-12 VITALS — BP 130/83 | HR 98 | Temp 98.3°F | Resp 18 | Ht <= 58 in | Wt 130.0 lb

## 2019-08-12 DIAGNOSIS — Z23 Encounter for immunization: Secondary | ICD-10-CM | POA: Diagnosis not present

## 2019-08-12 DIAGNOSIS — C3431 Malignant neoplasm of lower lobe, right bronchus or lung: Secondary | ICD-10-CM

## 2019-08-12 DIAGNOSIS — L270 Generalized skin eruption due to drugs and medicaments taken internally: Secondary | ICD-10-CM

## 2019-08-12 DIAGNOSIS — Z79899 Other long term (current) drug therapy: Secondary | ICD-10-CM | POA: Diagnosis not present

## 2019-08-12 DIAGNOSIS — R197 Diarrhea, unspecified: Secondary | ICD-10-CM | POA: Diagnosis not present

## 2019-08-12 DIAGNOSIS — Z5111 Encounter for antineoplastic chemotherapy: Secondary | ICD-10-CM | POA: Diagnosis not present

## 2019-08-12 DIAGNOSIS — R05 Cough: Secondary | ICD-10-CM | POA: Diagnosis not present

## 2019-08-12 DIAGNOSIS — C3491 Malignant neoplasm of unspecified part of right bronchus or lung: Secondary | ICD-10-CM

## 2019-08-12 DIAGNOSIS — R21 Rash and other nonspecific skin eruption: Secondary | ICD-10-CM | POA: Diagnosis not present

## 2019-08-12 DIAGNOSIS — Z95828 Presence of other vascular implants and grafts: Secondary | ICD-10-CM | POA: Diagnosis not present

## 2019-08-12 LAB — CBC WITH DIFFERENTIAL (CANCER CENTER ONLY)
Abs Immature Granulocytes: 0.02 10*3/uL (ref 0.00–0.07)
Basophils Absolute: 0 10*3/uL (ref 0.0–0.1)
Basophils Relative: 0 %
Eosinophils Absolute: 0.1 10*3/uL (ref 0.0–0.5)
Eosinophils Relative: 1 %
HCT: 40.4 % (ref 36.0–46.0)
Hemoglobin: 12.8 g/dL (ref 12.0–15.0)
Immature Granulocytes: 0 %
Lymphocytes Relative: 22 %
Lymphs Abs: 1.7 10*3/uL (ref 0.7–4.0)
MCH: 28.3 pg (ref 26.0–34.0)
MCHC: 31.7 g/dL (ref 30.0–36.0)
MCV: 89.4 fL (ref 80.0–100.0)
Monocytes Absolute: 0.6 10*3/uL (ref 0.1–1.0)
Monocytes Relative: 8 %
Neutro Abs: 5.2 10*3/uL (ref 1.7–7.7)
Neutrophils Relative %: 69 %
Platelet Count: 298 10*3/uL (ref 150–400)
RBC: 4.52 MIL/uL (ref 3.87–5.11)
RDW: 13.5 % (ref 11.5–15.5)
WBC Count: 7.7 10*3/uL (ref 4.0–10.5)
nRBC: 0 % (ref 0.0–0.2)

## 2019-08-12 LAB — CMP (CANCER CENTER ONLY)
ALT: 19 U/L (ref 0–44)
AST: 25 U/L (ref 15–41)
Albumin: 4 g/dL (ref 3.5–5.0)
Alkaline Phosphatase: 78 U/L (ref 38–126)
Anion gap: 7 (ref 5–15)
BUN: 21 mg/dL — ABNORMAL HIGH (ref 6–20)
CO2: 29 mmol/L (ref 22–32)
Calcium: 8.7 mg/dL — ABNORMAL LOW (ref 8.9–10.3)
Chloride: 104 mmol/L (ref 98–111)
Creatinine: 0.7 mg/dL (ref 0.44–1.00)
GFR, Est AFR Am: >60 mL/min (ref >60)
GFR, Estimated: >60 mL/min (ref >60)
Glucose, Bld: 100 mg/dL — ABNORMAL HIGH (ref 70–99)
Potassium: 4.4 mmol/L (ref 3.5–5.1)
Sodium: 140 mmol/L (ref 135–145)
Total Bilirubin: 0.4 mg/dL (ref 0.3–1.2)
Total Protein: 7.2 g/dL (ref 6.5–8.1)

## 2019-08-12 MED ORDER — INFLUENZA VAC SPLIT QUAD 0.5 ML IM SUSY
PREFILLED_SYRINGE | INTRAMUSCULAR | Status: AC
  Filled 2019-08-12: qty 0.5

## 2019-08-12 MED ORDER — HYDROCOD POLST-CPM POLST ER 10-8 MG/5ML PO SUER: ORAL | 0 refills | Status: DC

## 2019-08-12 MED ORDER — INFLUENZA VAC SPLIT QUAD 0.5 ML IM SUSY
0.5000 mL | PREFILLED_SYRINGE | Freq: Once | INTRAMUSCULAR | Status: AC
  Administered 2019-08-12: 0.5 mL via INTRAMUSCULAR

## 2019-08-12 MED FILL — HYDROCODONE-CHLORPHEN ER SU: 10-8 | 28 days supply | Qty: 280 | Fill #0

## 2019-08-12 NOTE — Telephone Encounter (Signed)
Scheduled per 09/14 los, patient received after visit summary and calender.

## 2019-08-12 NOTE — Patient Instructions (Signed)
-  Selsun Blue Shampoo- Available over the counter -

## 2019-08-12 NOTE — Progress Notes (Signed)
[facility] OFFICE PROGRESS NOTE  Golden Circle, FNP [address] [place] [address]  DIAGNOSIS: Local recurrence of non-small cell lung cancer, adenocarcinoma, initially diagnosed as stage IB (T2a N0 M0) in March of 2010.   PRIOR THERAPY: 1. Status post right lower lobectomy under the care of Dr. Cyndia Bent on 04/08/2009. 2. Status post palliative radiotherapy to the right lower lobe recurrent lung mass under the care of Dr. Lisbeth Renshaw, completed on June 08, 2011. 3. Systemic chemotherapy with carboplatin for AUC of 5 and Alimta 500 mg/M2 every 3 weeks. She is status post 3 cycles.  CURRENT THERAPY: Tarceva 150 mg by mouth daily, therapy beginning 07/24/2012. Status post approximately 85 months of therapy.   INTERVAL HISTORY: [name] 58 y.o. female returns to the clinic for a follow-up visit.  The patient is feeling well today without any concerning complaints except for continued rash on her eyebrows and a mild rash above her lip, occasional dry cough, and dryness on the tips of her fingers and feet. She uses clindamycin cream on her rash on her eyebrows and states that it helps. She has unchanged chronic diarrhea without any associated blood in the stool, nausea, vomiting, or abdominal pain. She states she has about 3-4 loose stools every other day or so. She uses tussinex for her chronic dry cough. She denies associated fevers, chills, night sweats, shortness of breath, chest pain, or hemoptysis. She is interested in getting a flu shot today.   Her only new complaint today is related to a dry itchy scalp for about 1 month. She denies any new soaps, detergents, lotions, hair products, etc.  She denies any headaches or visual changes. She is here today for evaluation and repeat lab work.   MEDICAL HISTORY: Past Medical History:  Diagnosis Date  . Drug-induced skin rash 02/27/2017  . Encounter for antineoplastic chemotherapy 01/19/2016  . History of radiation therapy  05/02/11 to 06/08/11   right lung  . Lung cancer ([place])    right lower lobe adenocarcinoma    ALLERGIES:  is allergic to codeine and doxycycline.  MEDICATIONS:  Current Outpatient Medications  Medication Sig Dispense Refill  . acetaminophen (TYLENOL) 500 MG tablet Take 500 mg every 6 (six) hours as needed by mouth.    Marland Kitchen albuterol (PROAIR HFA) 108 (90 Base) MCG/ACT inhaler Inhale 1-2 puffs into the lungs every 6 (six) hours as needed for wheezing or shortness of breath. 1 Inhaler 2  . chlorpheniramine-HYDROcodone (TUSSIONEX) 10-8 MG/5ML SUER TAKE 5 MLS BY MOUTH TWICE DAILY 280 mL 0  . clindamycin (CLEOCIN T) 1 % lotion APPLY TO AFFECTED AREA TWICE A DAY 60 mL 0  . diltiazem (CARDIZEM CD) 120 MG 24 hr capsule Take 1 capsule (120 mg total) by mouth daily. 90 capsule 4  . erlotinib (TARCEVA) 150 MG tablet Take 1 tablet (150 mg total) by mouth daily. Take on an empty stomach at least 1 hour before or 2 hours after food. 30 tablet 11  . lidocaine-prilocaine (EMLA) cream Apply 1 application topically as needed. 30 g 0   No current facility-administered medications for this visit.     SURGICAL HISTORY:  Past Surgical History:  Procedure Laterality Date  . LUNG LOBECTOMY  04/18/2009   RLL  . PORTACATH PLACEMENT  02/29/2012   Procedure: INSERTION PORT-A-CATH;  Surgeon: Nicanor Alcon, MD;  Location: [place];  Service: Thoracic;  Laterality: Left;  PowerPort 8 F attachable in left internal jugular.  REVIEW OF SYSTEMS:   Review of Systems  Constitutional: Negative for appetite change, chills, fatigue, fever and unexpected weight change.  HENT: Negative for mouth sores, nosebleeds, sore throat and trouble swallowing.   Eyes: Negative for eye problems and icterus.  Respiratory: Positive for dry cough. Negative for hemoptysis, shortness of breath and wheezing.   Cardiovascular: Negative for chest pain and leg swelling.  Gastrointestinal: Positive for occasional diarrhea. Negative for abdominal pain,  constipation, nausea and vomiting.  Genitourinary: Negative for bladder incontinence, difficulty urinating, dysuria, frequency and hematuria.   Musculoskeletal: Negative for back pain, gait problem, neck pain and neck stiffness.  Skin: Positive for rash bilaterally on here eyebrows as well as a dry itchy scalp. Neurological: Negative for dizziness, extremity weakness, gait problem, headaches, light-headedness and seizures.  Hematological: Negative for adenopathy. Does not bruise/bleed easily.  Psychiatric/Behavioral: Negative for confusion, depression and sleep disturbance. The patient is not nervous/anxious.     PHYSICAL EXAMINATION:  Blood pressure 130/83, pulse 98, temperature 98.3 F (36.8 C), temperature source Temporal, resp. rate 18, height 4\' 9"  (1.448 m), weight 130 lb (59 kg), last menstrual period 02/10/2012, SpO2 99 %.  ECOG PERFORMANCE STATUS: 1 - Symptomatic but completely ambulatory  Physical Exam  Constitutional: Oriented to person, place, and time and well-developed, well-nourished, and in no distress.  HENT:  Head: Normocephalic and atraumatic.  Mouth/Throat: Oropharynx is clear and moist. No oropharyngeal exudate.  Eyes: Conjunctivae are normal. Right eye exhibits no discharge. Left eye exhibits no discharge. No scleral icterus.  Neck: Normal range of motion. Neck supple.  Cardiovascular: Normal rate, regular rhythm, normal heart sounds and intact distal pulses.   Pulmonary/Chest: Effort normal. Decreased breath sounds on right lower lobe. No respiratory distress. No wheezes. No rales.  Abdominal: Soft. Bowel sounds are normal. Exhibits no distension and no mass. There is no tenderness.  Musculoskeletal: Normal range of motion. Exhibits no edema.  Lymphadenopathy:    No cervical adenopathy.  Neurological: Alert and oriented to person, place, and time. Exhibits normal muscle tone. Gait normal. Coordination normal.  Skin: Redness over eyebrows bilaterally. Dry scalp in  the occipital region. Skin is warm and dry.  Not diaphoretic. No pallor.  Psychiatric: Mood, memory and judgment normal.  Vitals reviewed.  LABORATORY DATA: Lab Results  Component Value Date   WBC 7.7 08/12/2019   HGB 12.8 08/12/2019   HCT 40.4 08/12/2019   MCV 89.4 08/12/2019   PLT 298 08/12/2019      Chemistry      Component Value Date/Time   NA 140 08/12/2019 0917   NA 143 10/30/2017 1031   K 4.4 08/12/2019 0917   K 4.3 10/30/2017 1031   CL 104 08/12/2019 0917   CL 102 04/30/2013 1002   CO2 29 08/12/2019 0917   CO2 29 10/30/2017 1031   BUN 21 (H) 08/12/2019 0917   BUN 14.6 10/30/2017 1031   CREATININE 0.70 08/12/2019 0917   CREATININE 0.7 10/30/2017 1031      Component Value Date/Time   CALCIUM 8.7 (L) 08/12/2019 0917   CALCIUM 9.2 10/30/2017 1031   ALKPHOS 78 08/12/2019 0917   ALKPHOS 103 10/30/2017 1031   AST 25 08/12/2019 0917   AST 20 10/30/2017 1031   ALT 19 08/12/2019 0917   ALT 14 10/30/2017 1031   BILITOT 0.4 08/12/2019 0917   BILITOT 0.79 10/30/2017 1031       RADIOGRAPHIC STUDIES:  No results found.   ASSESSMENT/PLAN:  This is a very pleasant 58 year old Asian female  with recurrent non-small cell lung cancer, adenocarcinoma.  She initially presented with a right lower lobe lung mass in March 2010.  She is status post surgical resection of the right lower lobe lung mass in 2010. This was followed by systemic chemotherapy with carboplatin and Alimta.   The patient showed evidence of disease progression and is currently undergoing treatment with Tarceva 150 mg by mouth daily.  She is status post 85 months of treatment and she has been tolerating treatment well without any adverse for side effects except for dry skin/rash particularly on her eye brows and mildly above her top lip.  The patient was seen with Dr. Julien Nordmann today.  Labs were reviewed.  Dr. Julien Nordmann recommends that she continue on her current treatment with Tarceva with the same dose.  I  will arrange for the patient to have a restaging CT scan of the chest performed prior to her next visit.  We will see the patient back for follow-up visit in 6 weeks for evaluation and to review her scan results.  For the dry cough I sent a refill for Hycodan.  For the skin rash, the patient will continue using her clindamycin lotion.  Additionally, Dr. Earlie Server recommends that the patient try using [facility] for her itchy scalp.  The patient will receive a flu shot while in clinic today.  The patient was advised to call immediately if she has any concerning symptoms in the interval. The patient voices understanding of current disease status and treatment options and is in agreement with the current care plan. All questions were answered. The patient knows to call the clinic with any problems, questions or concerns. We can certainly see the patient much sooner if necessary  Orders Placed This Encounter  Procedures  . CT Chest W Contrast    Standing Status:   Future    Standing Expiration Date:   08/11/2020    Order Specific Question:   ** REASON FOR EXAM (FREE TEXT)    Answer:   Restaging CT scan    Order Specific Question:   If indicated for the ordered procedure, I authorize the administration of contrast media per Radiology protocol    Answer:   Yes    Order Specific Question:   Is patient pregnant?    Answer:   No    Order Specific Question:   Preferred imaging location?    Answer:   [facility]    Order Specific Question:   Radiology Contrast Protocol - do NOT remove file path    Answer:   [email or web address]  . CMP ([place] only)    Standing Status:   Future    Standing Expiration Date:   08/11/2020  . CBC with Differential (Cancer Center Only)    Standing Status:   Future    Standing Expiration Date:   08/11/2020     Tobe Sos Boniface Goffe, PA-C 08/12/19  ADDENDUM: Hematology/Oncology Attending: I had a face-to-face encounter  with the patient today.  I recommended her care plan.  This is a very pleasant 58 years old Asian female with recurrent non-small cell lung cancer, adenocarcinoma.  She has been on treatment with Tarceva 150 mg p.o. daily status post 85 months of treatment and has been tolerating the treatment well except for the persistent mild skin rash especially on the eyebrows and few episodes of diarrhea.  She also has dry cough and currently on treatment with Tussionex. Her lab work today is unremarkable.  I recommended for the  patient to continue her current treatment with Tarceva.  She will come back for follow-up visit in 6 weeks for evaluation with repeat CT scan of the chest for restaging of her disease. The patient was advised to call immediately if she has any concerning symptoms in the interval.  Disclaimer: This note was dictated with voice recognition software. Similar sounding words can inadvertently be transcribed and may be missed upon review. [name], MD 08/12/19

## 2019-08-30 MED FILL — CLINDAMYCIN PHOSP 1% LOTION: 1 | 23 days supply | Qty: 60 | Fill #1

## 2019-08-30 MED FILL — HYDROCODONE-CHLORPHEN ER SU: 10-8 | 28 days supply | Qty: 280 | Fill #0

## 2019-09-19 ENCOUNTER — Ambulatory Visit (HOSPITAL_COMMUNITY)
Admission: RE | Admit: 2019-09-19 | Discharge: 2019-09-19 | Disposition: A | Discharge status: Home / Self Care | Payer: Medicare Other | Source: Ambulatory Visit | Attending: Physician Assistant | Admitting: Physician Assistant

## 2019-09-19 ENCOUNTER — Other Ambulatory Visit: Payer: Self-pay

## 2019-09-19 ENCOUNTER — Inpatient Hospital Stay: Payer: Medicare Other | Attending: Internal Medicine

## 2019-09-19 DIAGNOSIS — C3431 Malignant neoplasm of lower lobe, right bronchus or lung: Secondary | ICD-10-CM

## 2019-09-19 DIAGNOSIS — J181 Lobar pneumonia, unspecified organism: Secondary | ICD-10-CM | POA: Diagnosis not present

## 2019-09-19 LAB — CMP (CANCER CENTER ONLY)
ALT: 25 U/L (ref 0–44)
AST: 23 U/L (ref 15–41)
Albumin: 3.9 g/dL (ref 3.5–5.0)
Alkaline Phosphatase: 92 U/L (ref 38–126)
Anion gap: 12 (ref 5–15)
BUN: 13 mg/dL (ref 6–20)
CO2: 27 mmol/L (ref 22–32)
Calcium: 9.1 mg/dL (ref 8.9–10.3)
Chloride: 103 mmol/L (ref 98–111)
Creatinine: 0.68 mg/dL (ref 0.44–1.00)
GFR, Est AFR Am: >60 mL/min (ref >60)
GFR, Estimated: >60 mL/min (ref >60)
Glucose, Bld: 105 mg/dL — ABNORMAL HIGH (ref 70–99)
Potassium: 4.4 mmol/L (ref 3.5–5.1)
Sodium: 142 mmol/L (ref 135–145)
Total Bilirubin: 0.9 mg/dL (ref 0.3–1.2)
Total Protein: 7.7 g/dL (ref 6.5–8.1)

## 2019-09-19 LAB — CBC WITH DIFFERENTIAL (CANCER CENTER ONLY)
Abs Immature Granulocytes: 0.02 10*3/uL (ref 0.00–0.07)
Basophils Absolute: 0 10*3/uL (ref 0.0–0.1)
Basophils Relative: 0 %
Eosinophils Absolute: 0.1 10*3/uL (ref 0.0–0.5)
Eosinophils Relative: 2 %
HCT: 42.3 % (ref 36.0–46.0)
Hemoglobin: 13.7 g/dL (ref 12.0–15.0)
Immature Granulocytes: 0 %
Lymphocytes Relative: 22 %
Lymphs Abs: 1.9 10*3/uL (ref 0.7–4.0)
MCH: 28.3 pg (ref 26.0–34.0)
MCHC: 32.4 g/dL (ref 30.0–36.0)
MCV: 87.4 fL (ref 80.0–100.0)
Monocytes Absolute: 0.7 10*3/uL (ref 0.1–1.0)
Monocytes Relative: 8 %
Neutro Abs: 5.8 10*3/uL (ref 1.7–7.7)
Neutrophils Relative %: 68 %
Platelet Count: 325 10*3/uL (ref 150–400)
RBC: 4.84 MIL/uL (ref 3.87–5.11)
RDW: 13.4 % (ref 11.5–15.5)
WBC Count: 8.6 10*3/uL (ref 4.0–10.5)
nRBC: 0 % (ref 0.0–0.2)

## 2019-09-19 MED ORDER — SODIUM CHLORIDE (PF) 0.9 % IJ SOLN
INTRAMUSCULAR | Status: AC
  Filled 2019-09-19: qty 50

## 2019-09-19 MED ORDER — IOHEXOL 300 MG/ML  SOLN
75.0000 mL | Freq: Once | INTRAMUSCULAR | Status: AC | PRN
  Administered 2019-09-19: 75 mL via INTRAVENOUS

## 2019-09-23 ENCOUNTER — Other Ambulatory Visit: Payer: Medicare Other
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
## 2019-09-30 ENCOUNTER — Other Ambulatory Visit: Payer: Self-pay

## 2019-09-30 ENCOUNTER — Inpatient Hospital Stay: Payer: Medicare Other | Attending: Internal Medicine | Admitting: Physician Assistant

## 2019-09-30 ENCOUNTER — Telehealth: Payer: Self-pay | Admitting: Internal Medicine

## 2019-09-30 ENCOUNTER — Encounter: Payer: Self-pay | Admitting: Physician Assistant

## 2019-09-30 ENCOUNTER — Ambulatory Visit (INDEPENDENT_AMBULATORY_CARE_PROVIDER_SITE_OTHER): Payer: Medicare Other | Admitting: Physician Assistant

## 2019-09-30 VITALS — BP 140/93 | HR 98 | Temp 97.8°F | Resp 18 | Ht <= 58 in | Wt 129.0 lb

## 2019-09-30 VITALS — BP 116/68 | HR 98 | Ht <= 58 in | Wt 129.0 lb

## 2019-09-30 DIAGNOSIS — R21 Rash and other nonspecific skin eruption: Secondary | ICD-10-CM | POA: Diagnosis not present

## 2019-09-30 DIAGNOSIS — C3431 Malignant neoplasm of lower lobe, right bronchus or lung: Secondary | ICD-10-CM | POA: Diagnosis not present

## 2019-09-30 DIAGNOSIS — I471 Supraventricular tachycardia: Secondary | ICD-10-CM | POA: Diagnosis not present

## 2019-09-30 DIAGNOSIS — Z5111 Encounter for antineoplastic chemotherapy: Secondary | ICD-10-CM | POA: Diagnosis not present

## 2019-09-30 MED ORDER — HYDROCOD POLST-CPM POLST ER 10-8 MG/5ML PO SUER: ORAL | 0 refills | Status: DC

## 2019-09-30 MED ORDER — DILTIAZEM HCL ER COATED BEADS 120 MG PO CP24: 120.0000 mg | ORAL_CAPSULE | Freq: Every day | ORAL | 4 refills | Status: DC

## 2019-09-30 MED FILL — DILTIAZEM HCL ER COATED BEA: 120 | 90 days supply | Qty: 90 | Fill #0

## 2019-09-30 MED FILL — HYDROCODONE-CHLORPHEN ER SU: 10-8 | 28 days supply | Qty: 280 | Fill #0

## 2019-09-30 NOTE — Telephone Encounter (Signed)
Scheduled appt per 11/2 los - gave patient AVS and calender

## 2019-09-30 NOTE — Patient Instructions (Signed)
Medication Instructions:  Your physician recommends that you continue on your current medications as directed. Please refer to the Current Medication list given to you today.  *If you need a refill on your cardiac medications before your next appointment, please call your pharmacy*  Lab Work: [place]   If you have labs (blood work) drawn today and your tests are completely normal, you will receive your results only by: Marland Kitchen MyChart Message (if you have MyChart) OR . A paper copy in the mail If you have any lab test that is abnormal or we need to change your treatment, we will call you to review the results.  Testing/Procedures: NONE ORDERED  TODAY  Follow-Up: At [facility], you and your health needs are our priority.  As part of our continuing mission to provide you with exceptional heart care, we have created designated Provider Care Teams.  These Care Teams include your primary Cardiologist (physician) and Advanced Practice Providers (APPs -  Physician Assistants and Nurse Practitioners) who all work together to provide you with the care you need, when you need it.  Your next appointment:   6 months  The format for your next appointment:   In Person  Provider:    You may see None Dr. Caryl Comes  or one of the following Advanced Practice Providers on your designated Care Team:    Chanetta Marshall, NP  Tommye Standard, PA-C  Legrand Como "Oda [place]ts, [place]   Other Instructions

## 2019-09-30 NOTE — Progress Notes (Signed)
[facility] OFFICE PROGRESS NOTE  Golden Circle, FNP [address] [address] [place] [address]  DIAGNOSIS: Local recurrence of non-small cell lung cancer, adenocarcinoma, initially diagnosed as stage IB (T2a N0 M0) in March of 2010.   PRIOR THERAPY:  1. Status post right lower lobectomy under the care of Dr. Cyndia Bent on 04/08/2009. 2. Status post palliative radiotherapy to the right lower lobe recurrent lung mass under the care of Dr. Lisbeth Renshaw, completed on June 08, 2011. 3. Systemic chemotherapy with carboplatin for AUC of 5 and Alimta 500 mg/M2 every 3 weeks. She is status post 3 cycles.  CURRENT THERAPY: Tarceva 150 mg by mouth daily, therapy beginning 07/24/2012. Status post approximately 16months of therapy.   INTERVAL HISTORY: [name] 58 y.o. female returns to the clinic for a follow up visit. The patient is feeling well today without any concerning complaints except for continued rash on her eyebrows which comes and goes. She denies any significant rash today. She uses clindamycin cream for  her rash on her eyebrows and states that it helps. She has chronic diarrhea without any associated blood in the stool, nausea, vomiting, or abdominal pain. Her diarrhea has slowed down some and she reports diarrhea 1-2 times per day every other day or so. She uses tussinex for her chronic dry cough. She denies associated fevers, chills, night sweats, chest pain, or hemoptysis. She reports her baseline shortness of breath with exertion. She reported a dry itchy scalp at her last visit. She has been using selsun blue shampoo which has helped, but she still is having some dryness of her scalp. She denies any headaches or visual changes. She recently had a restaging CT scan performed. She is here for evaluation and to review her scan results.   MEDICAL HISTORY: Past Medical History:  Diagnosis Date  . Drug-induced skin rash 02/27/2017  . Encounter for antineoplastic chemotherapy  01/19/2016  . History of radiation therapy 05/02/11 to 06/08/11   right lung  . Lung cancer ([place])    right lower lobe adenocarcinoma    ALLERGIES:  is allergic to codeine and doxycycline.  MEDICATIONS:  Current Outpatient Medications  Medication Sig Dispense Refill  . acetaminophen (TYLENOL) 500 MG tablet Take 500 mg every 6 (six) hours as needed by mouth.    Marland Kitchen albuterol (PROAIR HFA) 108 (90 Base) MCG/ACT inhaler Inhale 1-2 puffs into the lungs every 6 (six) hours as needed for wheezing or shortness of breath. 1 Inhaler 2  . chlorpheniramine-HYDROcodone (TUSSIONEX) 10-8 MG/5ML SUER TAKE 5 MLS BY MOUTH TWICE DAILY 280 mL 0  . clindamycin (CLEOCIN T) 1 % lotion APPLY TO AFFECTED AREA TWICE A DAY 60 mL 0  . diltiazem (CARDIZEM CD) 120 MG 24 hr capsule Take 1 capsule (120 mg total) by mouth daily. 90 capsule 4  . erlotinib (TARCEVA) 150 MG tablet Take 1 tablet (150 mg total) by mouth daily. Take on an empty stomach at least 1 hour before or 2 hours after food. 30 tablet 11  . lidocaine-prilocaine (EMLA) cream Apply 1 application topically as needed. 30 g 0   No current facility-administered medications for this visit.     SURGICAL HISTORY:  Past Surgical History:  Procedure Laterality Date  . LUNG LOBECTOMY  04/18/2009   RLL  . PORTACATH PLACEMENT  02/29/2012   Procedure: INSERTION PORT-A-CATH;  Surgeon: Nicanor Alcon, MD;  Location: [place];  Service: Thoracic;  Laterality: Left;  PowerPort 8 F attachable in left internal  jugular.    REVIEW OF SYSTEMS:   Review of Systems  Constitutional: Negative for appetite change, chills, fatigue, fever and unexpected weight change.  HENT: Negative for mouth sores, nosebleeds, sore throat and trouble swallowing.   Eyes: Negative for eye problems and icterus.  Respiratory: Positive for dry cough and dyspnea on exertion. Negative for hemoptysis and wheezing.   Cardiovascular: Negative for chest pain and leg swelling.  Gastrointestinal: Positive for  occasional diarrhea. Negative for abdominal pain, constipation, nausea and vomiting.  Genitourinary: Negative for bladder incontinence, difficulty urinating, dysuria, frequency and hematuria.   Musculoskeletal: Negative for back pain, gait problem, neck pain and neck stiffness.  Skin: Positive for rash bilaterally on here eyebrows as well  Neurological: Negative for dizziness, extremity weakness, gait problem, headaches, light-headedness and seizures.  Hematological: Negative for adenopathy. Does not bruise/bleed easily.  Psychiatric/Behavioral: Negative for confusion, depression and sleep disturbance. The patient is not nervous/anxious.    PHYSICAL EXAMINATION:  Blood pressure (!) 140/93, pulse 98, temperature 97.8 F (36.6 C), temperature source Temporal, resp. rate 18, height 4\' 9"  (1.448 m), weight 129 lb (58.5 kg), last menstrual period 02/10/2012, SpO2 99 %.  ECOG PERFORMANCE STATUS: 1 - Symptomatic but completely ambulatory  Physical Exam  Constitutional: Oriented to person, place, and time and well-developed, well-nourished, and in no distress. No distress.  HENT:  Head: Normocephalic and atraumatic.  Mouth/Throat: Oropharynx is clear and moist. No oropharyngeal exudate.  Eyes: Conjunctivae are normal. Right eye exhibits no discharge. Left eye exhibits no discharge. No scleral icterus.  Neck: Normal range of motion. Neck supple.  Cardiovascular: Normal rate, regular rhythm, normal heart sounds and intact distal pulses.   Pulmonary/Chest: Effort normal. Decreased breath sounds on right lower lobe No respiratory distress. No wheezes. No rales.  Abdominal: Soft. Bowel sounds are normal. Exhibits no distension and no mass. There is no tenderness.  Musculoskeletal: Normal range of motion. Exhibits no edema.  Lymphadenopathy:    No cervical adenopathy.  Neurological: Alert and oriented to person, place, and time. Exhibits normal muscle tone. Gait normal. Coordination normal.  Skin:  Redness over eyebrows bilaterally. Dry scalp in the occipital region, but improved from last visit. Skin is warm and dry. Not diaphoretic. No erythema. No pallor.  Psychiatric: Mood, memory and judgment normal.  Vitals reviewed.  LABORATORY DATA: Lab Results  Component Value Date   WBC 8.6 09/19/2019   HGB 13.7 09/19/2019   HCT 42.3 09/19/2019   MCV 87.4 09/19/2019   PLT 325 09/19/2019      Chemistry      Component Value Date/Time   NA 142 09/19/2019 0848   NA 143 10/30/2017 1031   K 4.4 09/19/2019 0848   K 4.3 10/30/2017 1031   CL 103 09/19/2019 0848   CL 102 04/30/2013 1002   CO2 27 09/19/2019 0848   CO2 29 10/30/2017 1031   BUN 13 09/19/2019 0848   BUN 14.6 10/30/2017 1031   CREATININE 0.68 09/19/2019 0848   CREATININE 0.7 10/30/2017 1031      Component Value Date/Time   CALCIUM 9.1 09/19/2019 0848   CALCIUM 9.2 10/30/2017 1031   ALKPHOS 92 09/19/2019 0848   ALKPHOS 103 10/30/2017 1031   AST 23 09/19/2019 0848   AST 20 10/30/2017 1031   ALT 25 09/19/2019 0848   ALT 14 10/30/2017 1031   BILITOT 0.9 09/19/2019 0848   BILITOT 0.79 10/30/2017 1031       RADIOGRAPHIC STUDIES:  Ct Chest W Contrast  Result Date: 09/19/2019  CLINICAL DATA:  Restaging EXAM: CT CHEST WITH CONTRAST TECHNIQUE: Multidetector CT imaging of the chest was performed during intravenous contrast administration. CONTRAST:  49mL OMNIPAQUE IOHEXOL 300 MG/ML  SOLN COMPARISON:  05/17/2019, 11/08/2018 FINDINGS: Cardiovascular: Left chest port catheter. Normal heart size. Unchanged, trace pericardial effusion. Mediastinum/Nodes: No discretely enlarged mediastinal, hilar, or axillary lymph nodes. Unchanged 8 mm epicardial lymph node (series 2, image 84). Thyroid gland, trachea, and esophagus demonstrate no significant findings. Lungs/Pleura: Unchanged post treatment appearance of the right lung status post right lower lobectomy with dense perihilar fibrosis and masslike infrahilar consolidation measuring 3.9  x 2.8 cm (series 2, image 70), unchanged compared to prior examination when measured similarly. Unchanged 2 mm right upper lobe pulmonary nodule (series 7, image 45) and 4 mm ground-glass opacity (series 7, image 47). Unchanged, irregular and ground-glass opacities contiguous with adjacent fibrosis (series 7, image 47, 53). Trace right pleural effusion. Upper Abdomen: No acute abnormality.  Hepatic steatosis. Musculoskeletal: No chest wall mass or suspicious bone lesions identified. Chronic fracture deformities of the posterior right sixth and seventh ribs. IMPRESSION: 1. Unchanged post treatment appearance of the right lung status post right lower lobectomy with dense perihilar fibrosis and masslike infrahilar consolidation measuring 3.9 x 2.8 cm (series 2, image 70), unchanged compared to prior examination when measured similarly. Trace right pleural effusion. 2. Unchanged 2 mm right upper lobe pulmonary nodule (series 7, image 45) and 4 mm ground-glass opacity (series 7, image 47). Unchanged, irregular and ground-glass opacities contiguous with adjacent fibrosis (series 7, image 47, 53). Attention on follow-up. 3. Unchanged 8 mm epicardial lymph node (series 2, image 84). Attention on follow-up. 4.  Hepatic steatosis. Electronically Signed   By: Eddie Candle M.D.   On: 09/19/2019 15:02     ASSESSMENT/PLAN:  This is a very pleasant 58 year old Asian female with recurrent non-small cell lung cancer, adenocarcinoma.  She initially presented with a right lower lobe lung mass in March 2010.  She is status post surgical resection of the right lower lobe lung mass in 2010. This was followed by systemic chemotherapy with carboplatin and Alimta.   The patient showed evidence of disease progression and is currently undergoing treatment with Tarceva 150 mg by mouth daily.  She is status post 86 months of treatment and she has been tolerating treatment well without any adverse for side effects except for dry  skin/rash particularly on her eye brows and mildly above her top lip.  The patient recently had a restaging CT scan. Dr. Julien Nordmann personally and independenly reviewed the scan and discussed the results with the patient today. The scan did not show any evidence of disease progression. Dr. Julien Nordmann recommends that she continue on the same treatment at the same dose.   We will see the patient back for follow-up visit in 2 months for evaluation and repeat lab work.   For the dry cough I sent a refill for tussinex.  For the skin rash, the patient will continue using her clindamycin lotion. She will continue using [facility] for her itchy scalp.  In case the patient develops significant diarrhea in the future, I discussed that the patient may use imodium if needed.   The patient was advised to call immediately if she has any concerning symptoms in the interval. The patient voices understanding of current disease status and treatment options and is in agreement with the current care plan. All questions were answered. The patient knows to call the clinic with any problems, questions or concerns.  We can certainly see the patient much sooner if necessary  Orders Placed This Encounter  Procedures  . CBC with Differential (Cancer Center Only)    Standing Status:   Future    Standing Expiration Date:   09/29/2020  . CMP ([place] only)    Standing Status:   Future    Standing Expiration Date:   09/29/2020     Tobe Sos Emree Locicero, PA-C 09/30/19   ADDENDUM: Hematology/Oncology Attending: I had a face-to-face encounter with the patient today.  I recommended her care plan.  This is a very pleasant 58 years old Asian female with recurrent non-small cell lung cancer, adenocarcinoma and has been on treatment with Tarceva 150 mg p.o. daily status post 36 months of treatment and has been tolerating the treatment well except for the intermittent diarrhea and skin rash especially in the  eyebrows.  She also has persistent dry cough and requesting refill of Hycodan. The patient had repeat CT scan of the chest performed recently.  I personally and independently reviewed the scans and discussed the results with the patient today. Her scan showed no concerning findings for disease progression. I recommended for the patient to continue her current treatment with Tarceva with the same dose. I will see her back for follow-up visit in 2 months for evaluation and repeat blood work. We will give her a refill of Hycodan today. The patient was advised to call immediately if she has any concerning symptoms in the interval.  Disclaimer: This note was dictated with voice recognition software. Similar sounding words can inadvertently be transcribed and may be missed upon review. [name], MD 09/30/19

## 2019-10-16 ENCOUNTER — Other Ambulatory Visit: Payer: Self-pay | Admitting: Internal Medicine

## 2019-10-16 DIAGNOSIS — C3431 Malignant neoplasm of lower lobe, right bronchus or lung: Secondary | ICD-10-CM

## 2019-10-16 DIAGNOSIS — Z5111 Encounter for antineoplastic chemotherapy: Secondary | ICD-10-CM

## 2019-10-16 MED FILL — HYDROCODONE-CHLORPHEN ER SU: 10-8 | 28 days supply | Qty: 280 | Fill #0

## 2019-10-16 MED FILL — DILTIAZEM HCL ER COATED BEA: 120 | 90 days supply | Qty: 90 | Fill #0

## 2019-10-16 MED FILL — CLINDAMYCIN PHOSP 1% LOTION: 1 | 30 days supply | Qty: 60 | Fill #0

## 2019-11-04 ENCOUNTER — Telehealth: Payer: Self-pay

## 2019-11-04 ENCOUNTER — Other Ambulatory Visit: Payer: Self-pay | Admitting: Internal Medicine

## 2019-11-04 DIAGNOSIS — C3491 Malignant neoplasm of unspecified part of right bronchus or lung: Secondary | ICD-10-CM

## 2019-11-04 DIAGNOSIS — C3431 Malignant neoplasm of lower lobe, right bronchus or lung: Secondary | ICD-10-CM

## 2019-11-04 DIAGNOSIS — Z95828 Presence of other vascular implants and grafts: Secondary | ICD-10-CM

## 2019-11-04 MED ORDER — ERLOTINIB HCL 150 MG PO TABS: 150.0000 mg | ORAL_TABLET | Freq: Every day | ORAL | 11 refills | Status: DC

## 2019-11-04 NOTE — Telephone Encounter (Signed)
[name] daughter, [name], called requesting refill for Tarceva.

## 2019-11-05 ENCOUNTER — Other Ambulatory Visit: Payer: Self-pay | Admitting: Pharmacist

## 2019-11-05 DIAGNOSIS — C3431 Malignant neoplasm of lower lobe, right bronchus or lung: Secondary | ICD-10-CM

## 2019-11-05 DIAGNOSIS — Z95828 Presence of other vascular implants and grafts: Secondary | ICD-10-CM

## 2019-11-05 DIAGNOSIS — C3491 Malignant neoplasm of unspecified part of right bronchus or lung: Secondary | ICD-10-CM

## 2019-11-05 MED ORDER — ERLOTINIB HCL 150 MG PO TABS: 150.0000 mg | ORAL_TABLET | Freq: Every day | ORAL | 11 refills | Status: DC

## 2019-11-05 NOTE — Progress Notes (Signed)
Oral Chemotherapy Pharmacist Encounter   Refill prescription for Tarceva (erlotinib) redirected to [facility], the patient assistance pharmacy for [place]. They are providing the patient with Tarceva at no cost. We were informed by [employer] that they will no longer be making Tarceva as of 11/25/2019.  They will use what they have until they run out and will be in contact with MD and patient when they are running low. We will wait list her for grant assistance.  Darl Pikes, PharmD, BCPS, [facility] Hematology/Oncology Clinical Pharmacist [facility]/HP/AP Oral [facility] [phone]  11/05/2019 12:09 PM

## 2019-11-14 ENCOUNTER — Telehealth: Payer: Self-pay | Admitting: Pharmacy Technician

## 2019-11-14 NOTE — Telephone Encounter (Signed)
Oral Oncology Patient Advocate Encounter  Applied online to the Patient [place] (PAF) for Non Small Cell Lung Cancer grant to help reduce patients out of pocket cost for Tarceva. Application is pending due to the high volume of applications.    Patient's assistance through the manufacturer is going to end.  The manufacturer is stopping production of the brand name Tarceva on 11/25/2019.  They will continue to supply their current patients with medication until the supply runs out.  There is a generic for Tarceva that is available but it does not have patient assistance available.  We will continue to check for updates on the patients application.  [place] Patient [place] Phone [phone] Fax [phone] 11/14/2019 1:53 PM

## 2019-11-19 NOTE — Telephone Encounter (Signed)
Fund closed while applying and application was denied.  We can reapply when funds reopen.  If additional foundations open for Non Small Cell Lung Cancer (NSCLC) we will apply to secure funds.

## 2019-12-02 ENCOUNTER — Ambulatory Visit: Payer: Medicare Other | Attending: Internal Medicine

## 2019-12-02 ENCOUNTER — Other Ambulatory Visit: Payer: Medicare Other

## 2019-12-02 ENCOUNTER — Ambulatory Visit: Payer: Medicare Other | Admitting: Internal Medicine

## 2019-12-02 DIAGNOSIS — Z20822 Contact with and (suspected) exposure to covid-19: Secondary | ICD-10-CM

## 2019-12-03 ENCOUNTER — Telehealth: Payer: Self-pay

## 2019-12-03 LAB — NOVEL CORONAVIRUS, NAA: SARS-CoV-2, NAA: NOT DETECTED

## 2019-12-03 NOTE — Telephone Encounter (Signed)
Oral Oncology Patient Advocate Encounter   Was successful in securing patient a $4000 grant from [place] to provide copayment coverage for Tarceva.  This will keep the out of pocket expense at $0.      The billing information is as follows and has been shared with [place].   Member ID: [id number] Group ID: [id number] RxBin: [id number] PCN: [id number] Dates of Eligibility: 12/02/19 through 12/01/20  Fund name:  NSCLC.  [place] Patient [place] Phone [phone] Fax [phone] 12/03/2019 12:06 PM

## 2019-12-06 ENCOUNTER — Telehealth: Payer: Self-pay

## 2019-12-06 NOTE — Telephone Encounter (Signed)
Oral Oncology Patient Advocate Encounter   Was successful in securing patient an $37 grant from Patient [place] [facility]) to provide copayment coverage for Tarceva.  This will keep the out of pocket expense at $0.     I have spoken with the patient.    The billing information is as follows and has been shared with [place].   Member ID: [id number] Group ID: [id number] RxBin: [id number] Dates of Eligibility: 09/07/19 through 12/04/20  Fund:  [place] Patient [place] Phone [phone] Fax [phone] 12/06/2019 1:39 PM

## 2019-12-09 ENCOUNTER — Inpatient Hospital Stay: Payer: Medicare Other | Attending: Internal Medicine

## 2019-12-09 ENCOUNTER — Inpatient Hospital Stay (HOSPITAL_BASED_OUTPATIENT_CLINIC_OR_DEPARTMENT_OTHER): Payer: Medicare Other | Admitting: Internal Medicine

## 2019-12-09 ENCOUNTER — Encounter: Payer: Self-pay | Admitting: Internal Medicine

## 2019-12-09 ENCOUNTER — Other Ambulatory Visit: Payer: Self-pay

## 2019-12-09 VITALS — BP 131/88 | HR 106 | Temp 99.6°F | Resp 19 | Ht <= 58 in | Wt 124.2 lb

## 2019-12-09 DIAGNOSIS — Z923 Personal history of irradiation: Secondary | ICD-10-CM | POA: Insufficient documentation

## 2019-12-09 DIAGNOSIS — R21 Rash and other nonspecific skin eruption: Secondary | ICD-10-CM | POA: Diagnosis not present

## 2019-12-09 DIAGNOSIS — R000 Tachycardia, unspecified: Secondary | ICD-10-CM | POA: Diagnosis not present

## 2019-12-09 DIAGNOSIS — C3490 Malignant neoplasm of unspecified part of unspecified bronchus or lung: Secondary | ICD-10-CM

## 2019-12-09 DIAGNOSIS — L270 Generalized skin eruption due to drugs and medicaments taken internally: Secondary | ICD-10-CM | POA: Diagnosis not present

## 2019-12-09 DIAGNOSIS — I10 Essential (primary) hypertension: Secondary | ICD-10-CM | POA: Diagnosis not present

## 2019-12-09 DIAGNOSIS — C3431 Malignant neoplasm of lower lobe, right bronchus or lung: Secondary | ICD-10-CM | POA: Diagnosis not present

## 2019-12-09 DIAGNOSIS — Z5111 Encounter for antineoplastic chemotherapy: Secondary | ICD-10-CM

## 2019-12-09 LAB — CBC WITH DIFFERENTIAL (CANCER CENTER ONLY)
Abs Immature Granulocytes: 0.03 10*3/uL (ref 0.00–0.07)
Basophils Absolute: 0 10*3/uL (ref 0.0–0.1)
Basophils Relative: 0 %
Eosinophils Absolute: 0.1 10*3/uL (ref 0.0–0.5)
Eosinophils Relative: 1 %
HCT: 40.9 % (ref 36.0–46.0)
Hemoglobin: 13.2 g/dL (ref 12.0–15.0)
Immature Granulocytes: 0 %
Lymphocytes Relative: 19 %
Lymphs Abs: 1.6 10*3/uL (ref 0.7–4.0)
MCH: 29.1 pg (ref 26.0–34.0)
MCHC: 32.3 g/dL (ref 30.0–36.0)
MCV: 90.1 fL (ref 80.0–100.0)
Monocytes Absolute: 0.6 10*3/uL (ref 0.1–1.0)
Monocytes Relative: 7 %
Neutro Abs: 6 10*3/uL (ref 1.7–7.7)
Neutrophils Relative %: 73 %
Platelet Count: 335 10*3/uL (ref 150–400)
RBC: 4.54 MIL/uL (ref 3.87–5.11)
RDW: 14.5 % (ref 11.5–15.5)
WBC Count: 8.2 10*3/uL (ref 4.0–10.5)
nRBC: 0 % (ref 0.0–0.2)

## 2019-12-09 LAB — CMP (CANCER CENTER ONLY)
ALT: 16 U/L (ref 0–44)
AST: 21 U/L (ref 15–41)
Albumin: 4 g/dL (ref 3.5–5.0)
Alkaline Phosphatase: 76 U/L (ref 38–126)
Anion gap: 8 (ref 5–15)
BUN: 12 mg/dL (ref 6–20)
CO2: 29 mmol/L (ref 22–32)
Calcium: 8.9 mg/dL (ref 8.9–10.3)
Chloride: 106 mmol/L (ref 98–111)
Creatinine: 0.67 mg/dL (ref 0.44–1.00)
GFR, Est AFR Am: >60 mL/min (ref >60)
GFR, Estimated: >60 mL/min (ref >60)
Glucose, Bld: 95 mg/dL (ref 70–99)
Potassium: 4.4 mmol/L (ref 3.5–5.1)
Sodium: 143 mmol/L (ref 135–145)
Total Bilirubin: 1 mg/dL (ref 0.3–1.2)
Total Protein: 7.5 g/dL (ref 6.5–8.1)

## 2019-12-09 MED ORDER — DOXYCYCLINE HYCLATE 100 MG PO TABS: 100.0000 mg | ORAL_TABLET | Freq: Two times a day (BID) | ORAL | 0 refills | Status: DC

## 2019-12-09 MED ORDER — METHYLPREDNISOLONE 4 MG PO TBPK: ORAL_TABLET | ORAL | 0 refills | Status: DC

## 2019-12-09 MED ORDER — HYDROCOD POLST-CPM POLST ER 10-8 MG/5ML PO SUER: ORAL | 0 refills | Status: DC

## 2019-12-09 MED ORDER — CLINDAMYCIN PHOSPHATE 1 % EX LOTN: TOPICAL_LOTION | CUTANEOUS | 0 refills | Status: DC

## 2019-12-09 NOTE — Progress Notes (Signed)
[place] [phone]   Fax:([phone]) [phone]  OFFICE PROGRESS NOTE  Golden Circle, FNP [address] [place] [address]  PRINCIPAL DIAGNOSIS: Local recurrence of non-small cell lung cancer, adenocarcinoma, initially diagnosed as stage IB (T2a N0 M0) in March of 2010.   PRIOR THERAPY:  1. Status post right lower lobectomy under the care of Dr. Cyndia Bent on 04/08/2009. 2. Status post palliative radiotherapy to the right lower lobe recurrent lung mass under the care of Dr. Lisbeth Renshaw, completed on June 08, 2011. 3. Systemic chemotherapy with carboplatin for AUC of 5 and Alimta 500 mg/M2 every 3 weeks. She is status post 3 cycles.  CURRENT THERAPY: Tarceva 150 mg by mouth daily, therapy beginning 07/24/2012. Status post approximately 87 months of therapy.   INTERVAL HISTORY: [name] 59 y.o. female returns to the clinic today for follow-up visit accompanied by her interpreter.  The patient is feeling fine today with no concerning complaints except for more skin rash on the eyebrows as well as the face.  She also has mild sore throat and dry cough.  She was tested for COVID-19 around 10 days ago and it was negative.  She denied having any current chest pain, shortness of breath or hemoptysis.  She denied having any fever or chills.  She has no nausea, vomiting but has occasional episodes of diarrhea with no constipation.  She continues to tolerate her treatment with Tarceva fairly well.  She is here today for evaluation and repeat blood work.  MEDICAL HISTORY: Past Medical History:  Diagnosis Date  . Drug-induced skin rash 02/27/2017  . Encounter for antineoplastic chemotherapy 01/19/2016  . History of radiation therapy 05/02/11 to 06/08/11   right lung  . Lung cancer ([name])    right lower lobe adenocarcinoma    ALLERGIES:  is allergic to codeine and doxycycline.  MEDICATIONS:  Current Outpatient Medications  Medication Sig Dispense Refill  .  acetaminophen (TYLENOL) 500 MG tablet Take 500 mg every 6 (six) hours as needed by mouth.    Marland Kitchen albuterol (PROAIR HFA) 108 (90 Base) MCG/ACT inhaler Inhale 1-2 puffs into the lungs every 6 (six) hours as needed for wheezing or shortness of breath. 1 Inhaler 2  . chlorpheniramine-HYDROcodone (TUSSIONEX) 10-8 MG/5ML SUER TAKE 5 MLS BY MOUTH TWICE DAILY 280 mL 0  . clindamycin (CLEOCIN T) 1 % lotion APPLY TO AFFECTED AREA TWICE A DAY 60 mL 0  . diltiazem (CARDIZEM CD) 120 MG 24 hr capsule Take 1 capsule (120 mg total) by mouth daily. 90 capsule 4  . erlotinib (TARCEVA) 150 MG tablet Take 1 tablet (150 mg total) by mouth daily. Take on an empty stomach at least 1 hour before or 2 hours after food. 30 tablet 11  . lidocaine-prilocaine (EMLA) cream Apply 1 application topically as needed. 30 g 0   No current facility-administered medications for this visit.    SURGICAL HISTORY:  Past Surgical History:  Procedure Laterality Date  . LUNG LOBECTOMY  04/18/2009   RLL  . PORTACATH PLACEMENT  02/29/2012   Procedure: INSERTION PORT-A-CATH;  Surgeon: Nicanor Alcon, MD;  Location: [place];  Service: Thoracic;  Laterality: Left;  PowerPort 8 F attachable in left internal jugular.    REVIEW OF SYSTEMS:  Constitutional: negative Eyes: negative Ears, nose, mouth, throat, and face: positive for sore throat Respiratory: positive for cough Cardiovascular: negative Gastrointestinal: negative Genitourinary:negative Integument/breast: negative Hematologic/lymphatic: negative Musculoskeletal:negative Neurological: negative Behavioral/Psych: negative Endocrine:  negative Allergic/Immunologic: negative   PHYSICAL EXAMINATION: General appearance: alert, cooperative and no distress Head: Normocephalic, without obvious abnormality, atraumatic Neck: no adenopathy, no JVD, supple, symmetrical, trachea midline and thyroid not enlarged, symmetric, no tenderness/mass/nodules Lymph nodes: Cervical, supraclavicular, and  axillary nodes normal. Resp: clear to auscultation bilaterally Back: symmetric, no curvature. ROM normal. No CVA tenderness. Cardio: regular rate and rhythm, S1, S2 normal, no murmur, click, rub or gallop GI: soft, non-tender; bowel sounds normal; no masses,  no organomegaly Extremities: extremities normal, atraumatic, no cyanosis or edema Neurologic: Alert and oriented X 3, normal strength and tone. Normal symmetric reflexes. Normal coordination and gait  ECOG PERFORMANCE STATUS: 1 - Symptomatic but completely ambulatory  Blood pressure 131/88, pulse (!) 106, temperature 99.6 F (37.6 C), temperature source Temporal, resp. rate 19, height 4\' 9"  (1.448 m), weight 124 lb 3.2 oz (56.3 kg), last menstrual period 02/10/2012, SpO2 97 %.  LABORATORY DATA: Lab Results  Component Value Date   WBC 8.2 12/09/2019   HGB 13.2 12/09/2019   HCT 40.9 12/09/2019   MCV 90.1 12/09/2019   PLT 335 12/09/2019      Chemistry      Component Value Date/Time   NA 142 09/19/2019 0848   NA 143 10/30/2017 1031   K 4.4 09/19/2019 0848   K 4.3 10/30/2017 1031   CL 103 09/19/2019 0848   CL 102 04/30/2013 1002   CO2 27 09/19/2019 0848   CO2 29 10/30/2017 1031   BUN 13 09/19/2019 0848   BUN 14.6 10/30/2017 1031   CREATININE 0.68 09/19/2019 0848   CREATININE 0.7 10/30/2017 1031      Component Value Date/Time   CALCIUM 9.1 09/19/2019 0848   CALCIUM 9.2 10/30/2017 1031   ALKPHOS 92 09/19/2019 0848   ALKPHOS 103 10/30/2017 1031   AST 23 09/19/2019 0848   AST 20 10/30/2017 1031   ALT 25 09/19/2019 0848   ALT 14 10/30/2017 1031   BILITOT 0.9 09/19/2019 0848   BILITOT 0.79 10/30/2017 1031       RADIOGRAPHIC STUDIES: No results found.  ASSESSMENT AND PLAN:  This is a very pleasant 59 years old Asian female with recurrent non-small cell lung cancer, adenocarcinoma status post surgical resection followed by systemic chemotherapy with carboplatin and Alimta followed by disease progression and the  patient was started on treatment with Tarceva 150 mg by mouth daily status post 87 months  She has been tolerating this treatment well except for the worsening skin rash as well as few episodes of diarrhea. For the skin rash I will start the patient on Medrol Dosepak in addition to doxycycline orally for 7 days.  I will also give her a refill of clindamycin lotion. For the dry cough, I will give her a refill of Hycodan. For the tachycardia, she is followed by cardiology and currently on diltiazem. The patient will come back for follow-up visit in 6 weeks for evaluation with repeat CT scan of the chest for restaging of her disease. She was advised to call immediately if she has any concerning symptoms in the interval. All questions were answered. The patient knows to call the clinic with any problems, questions or concerns. We can certainly see the patient much sooner if necessary.  Disclaimer: This note was dictated with voice recognition software. Similar sounding words can inadvertently be transcribed and may not be corrected upon review.

## 2019-12-10 ENCOUNTER — Telehealth: Payer: Self-pay | Admitting: Internal Medicine

## 2019-12-10 NOTE — Telephone Encounter (Signed)
Scheduled per los. Called with interpreter. Left msg. Mailed prntout

## 2019-12-12 ENCOUNTER — Other Ambulatory Visit: Payer: Self-pay | Admitting: Physician Assistant

## 2019-12-12 DIAGNOSIS — C3431 Malignant neoplasm of lower lobe, right bronchus or lung: Secondary | ICD-10-CM

## 2019-12-13 ENCOUNTER — Other Ambulatory Visit: Payer: Self-pay | Admitting: Medical Oncology

## 2019-12-13 ENCOUNTER — Other Ambulatory Visit: Payer: Self-pay | Admitting: Internal Medicine

## 2019-12-13 ENCOUNTER — Other Ambulatory Visit: Payer: Self-pay | Admitting: *Deleted

## 2019-12-13 DIAGNOSIS — Z95828 Presence of other vascular implants and grafts: Secondary | ICD-10-CM

## 2019-12-13 DIAGNOSIS — C3431 Malignant neoplasm of lower lobe, right bronchus or lung: Secondary | ICD-10-CM

## 2019-12-13 DIAGNOSIS — Z5111 Encounter for antineoplastic chemotherapy: Secondary | ICD-10-CM

## 2019-12-13 DIAGNOSIS — C3491 Malignant neoplasm of unspecified part of right bronchus or lung: Secondary | ICD-10-CM

## 2019-12-13 MED ORDER — HYDROCOD POLST-CPM POLST ER 10-8 MG/5ML PO SUER: ORAL | 0 refills | Status: DC

## 2019-12-13 MED ORDER — METHYLPREDNISOLONE 4 MG PO TBPK: ORAL_TABLET | ORAL | 0 refills | Status: DC

## 2019-12-13 MED ORDER — ERLOTINIB HCL 150 MG PO TABS: 150.0000 mg | ORAL_TABLET | Freq: Every day | ORAL | 11 refills | Status: DC

## 2019-12-13 MED ORDER — DOXYCYCLINE HYCLATE 100 MG PO TABS: 100.0000 mg | ORAL_TABLET | Freq: Two times a day (BID) | ORAL | 0 refills | Status: DC

## 2019-12-13 MED FILL — DOXYCYCLINE HYCLATE 100 MG: 100 | 7 days supply | Qty: 14 | Fill #0

## 2019-12-13 MED FILL — HYDROCODONE-CHLORPHEN ER SU: 10-8 | 28 days supply | Qty: 280 | Fill #0

## 2019-12-13 MED FILL — METHYLPREDNISOLONE 4 MG TBP: 4 | 6 days supply | Qty: 21 | Fill #0

## 2020-01-17 ENCOUNTER — Ambulatory Visit (HOSPITAL_COMMUNITY): Admission: RE | Admit: 2020-01-17 | Payer: Medicare Other | Source: Ambulatory Visit

## 2020-01-17 ENCOUNTER — Other Ambulatory Visit: Payer: Medicare Other

## 2020-01-20 ENCOUNTER — Ambulatory Visit: Payer: Medicare Other | Admitting: Internal Medicine

## 2020-01-23 ENCOUNTER — Other Ambulatory Visit: Payer: Self-pay

## 2020-01-23 ENCOUNTER — Inpatient Hospital Stay: Payer: Medicare Other | Attending: Internal Medicine

## 2020-01-23 ENCOUNTER — Ambulatory Visit (HOSPITAL_COMMUNITY)
Admission: RE | Admit: 2020-01-23 | Discharge: 2020-01-23 | Disposition: A | Discharge status: Home / Self Care | Payer: Medicare Other | Source: Ambulatory Visit | Attending: Internal Medicine | Admitting: Internal Medicine

## 2020-01-23 DIAGNOSIS — J90 Pleural effusion, not elsewhere classified: Secondary | ICD-10-CM | POA: Diagnosis not present

## 2020-01-23 DIAGNOSIS — C3431 Malignant neoplasm of lower lobe, right bronchus or lung: Secondary | ICD-10-CM | POA: Insufficient documentation

## 2020-01-23 DIAGNOSIS — C3490 Malignant neoplasm of unspecified part of unspecified bronchus or lung: Secondary | ICD-10-CM | POA: Diagnosis not present

## 2020-01-23 LAB — CBC WITH DIFFERENTIAL (CANCER CENTER ONLY)
Abs Immature Granulocytes: 0.02 10*3/uL (ref 0.00–0.07)
Basophils Absolute: 0 10*3/uL (ref 0.0–0.1)
Basophils Relative: 0 %
Eosinophils Absolute: 0.1 10*3/uL (ref 0.0–0.5)
Eosinophils Relative: 1 %
HCT: 41.5 % (ref 36.0–46.0)
Hemoglobin: 13.4 g/dL (ref 12.0–15.0)
Immature Granulocytes: 0 %
Lymphocytes Relative: 26 %
Lymphs Abs: 2.1 10*3/uL (ref 0.7–4.0)
MCH: 29.1 pg (ref 26.0–34.0)
MCHC: 32.3 g/dL (ref 30.0–36.0)
MCV: 90.2 fL (ref 80.0–100.0)
Monocytes Absolute: 0.6 10*3/uL (ref 0.1–1.0)
Monocytes Relative: 7 %
Neutro Abs: 5.4 10*3/uL (ref 1.7–7.7)
Neutrophils Relative %: 66 %
Platelet Count: 322 10*3/uL (ref 150–400)
RBC: 4.6 MIL/uL (ref 3.87–5.11)
RDW: 13.7 % (ref 11.5–15.5)
WBC Count: 8.1 10*3/uL (ref 4.0–10.5)
nRBC: 0 % (ref 0.0–0.2)

## 2020-01-23 LAB — CMP (CANCER CENTER ONLY)
ALT: 18 U/L (ref 0–44)
AST: 22 U/L (ref 15–41)
Albumin: 3.9 g/dL (ref 3.5–5.0)
Alkaline Phosphatase: 81 U/L (ref 38–126)
Anion gap: 9 (ref 5–15)
BUN: 12 mg/dL (ref 6–20)
CO2: 29 mmol/L (ref 22–32)
Calcium: 8.8 mg/dL — ABNORMAL LOW (ref 8.9–10.3)
Chloride: 105 mmol/L (ref 98–111)
Creatinine: 0.74 mg/dL (ref 0.44–1.00)
GFR, Est AFR Am: >60 mL/min (ref >60)
GFR, Estimated: >60 mL/min (ref >60)
Glucose, Bld: 103 mg/dL — ABNORMAL HIGH (ref 70–99)
Potassium: 4.5 mmol/L (ref 3.5–5.1)
Sodium: 143 mmol/L (ref 135–145)
Total Bilirubin: 0.7 mg/dL (ref 0.3–1.2)
Total Protein: 7.6 g/dL (ref 6.5–8.1)

## 2020-01-23 MED ORDER — SODIUM CHLORIDE (PF) 0.9 % IJ SOLN
INTRAMUSCULAR | Status: AC
  Filled 2020-01-23: qty 50

## 2020-01-23 MED ORDER — IOHEXOL 300 MG/ML  SOLN
75.0000 mL | Freq: Once | INTRAMUSCULAR | Status: AC | PRN
  Administered 2020-01-23: 75 mL via INTRAVENOUS
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
## 2020-01-27 ENCOUNTER — Telehealth: Payer: Self-pay | Admitting: Internal Medicine

## 2020-01-27 ENCOUNTER — Encounter: Payer: Self-pay | Admitting: Physician Assistant

## 2020-01-27 ENCOUNTER — Inpatient Hospital Stay: Payer: Medicare Other | Attending: Internal Medicine | Admitting: Physician Assistant

## 2020-01-27 ENCOUNTER — Other Ambulatory Visit: Payer: Self-pay

## 2020-01-27 VITALS — BP 128/89 | HR 101 | Temp 98.7°F | Resp 18 | Ht <= 58 in | Wt 128.1 lb

## 2020-01-27 DIAGNOSIS — C3431 Malignant neoplasm of lower lobe, right bronchus or lung: Secondary | ICD-10-CM | POA: Diagnosis not present

## 2020-01-27 DIAGNOSIS — T451X5A Adverse effect of antineoplastic and immunosuppressive drugs, initial encounter: Secondary | ICD-10-CM | POA: Diagnosis not present

## 2020-01-27 DIAGNOSIS — Z79899 Other long term (current) drug therapy: Secondary | ICD-10-CM | POA: Diagnosis not present

## 2020-01-27 DIAGNOSIS — L270 Generalized skin eruption due to drugs and medicaments taken internally: Secondary | ICD-10-CM | POA: Diagnosis not present

## 2020-01-27 DIAGNOSIS — I10 Essential (primary) hypertension: Secondary | ICD-10-CM | POA: Diagnosis not present

## 2020-01-27 DIAGNOSIS — R05 Cough: Secondary | ICD-10-CM | POA: Diagnosis not present

## 2020-01-27 DIAGNOSIS — Z5111 Encounter for antineoplastic chemotherapy: Secondary | ICD-10-CM | POA: Diagnosis not present

## 2020-01-27 MED ORDER — CLINDAMYCIN PHOSPHATE 1 % EX LOTN: TOPICAL_LOTION | CUTANEOUS | 0 refills | Status: DC

## 2020-01-27 MED ORDER — HYDROCOD POLST-CPM POLST ER 10-8 MG/5ML PO SUER: ORAL | 0 refills | Status: DC

## 2020-01-27 NOTE — Telephone Encounter (Signed)
Scheduled per los. Gave avs and calendar  

## 2020-02-06 MED FILL — HYDROCODONE-CHLORPHEN ER SU: 10-8 | 28 days supply | Qty: 280 | Fill #0

## 2020-02-06 MED FILL — CLINDAMYCIN PHOSP 1% LOTION: 1 | 30 days supply | Qty: 60 | Fill #0

## 2020-02-20 MED FILL — DILTIAZEM HCL ER COATED BEA: 120 | 30 days supply | Qty: 30 | Fill #1

## 2020-03-09 ENCOUNTER — Encounter: Payer: Self-pay | Admitting: Internal Medicine

## 2020-03-09 ENCOUNTER — Other Ambulatory Visit: Payer: Self-pay

## 2020-03-09 ENCOUNTER — Inpatient Hospital Stay: Payer: Medicare Other | Attending: Internal Medicine | Admitting: Internal Medicine

## 2020-03-09 ENCOUNTER — Telehealth: Payer: Self-pay | Admitting: Internal Medicine

## 2020-03-09 ENCOUNTER — Inpatient Hospital Stay: Payer: Medicare Other

## 2020-03-09 VITALS — BP 136/93 | HR 99 | Temp 98.5°F | Resp 18 | Ht <= 58 in | Wt 128.2 lb

## 2020-03-09 DIAGNOSIS — I10 Essential (primary) hypertension: Secondary | ICD-10-CM

## 2020-03-09 DIAGNOSIS — L270 Generalized skin eruption due to drugs and medicaments taken internally: Secondary | ICD-10-CM

## 2020-03-09 DIAGNOSIS — R05 Cough: Secondary | ICD-10-CM | POA: Diagnosis not present

## 2020-03-09 DIAGNOSIS — C3431 Malignant neoplasm of lower lobe, right bronchus or lung: Secondary | ICD-10-CM

## 2020-03-09 DIAGNOSIS — C3490 Malignant neoplasm of unspecified part of unspecified bronchus or lung: Secondary | ICD-10-CM | POA: Diagnosis not present

## 2020-03-09 DIAGNOSIS — Z79899 Other long term (current) drug therapy: Secondary | ICD-10-CM | POA: Insufficient documentation

## 2020-03-09 DIAGNOSIS — Z923 Personal history of irradiation: Secondary | ICD-10-CM | POA: Insufficient documentation

## 2020-03-09 DIAGNOSIS — Z5111 Encounter for antineoplastic chemotherapy: Secondary | ICD-10-CM

## 2020-03-09 DIAGNOSIS — R21 Rash and other nonspecific skin eruption: Secondary | ICD-10-CM | POA: Diagnosis not present

## 2020-03-09 LAB — CBC WITH DIFFERENTIAL (CANCER CENTER ONLY)
Abs Immature Granulocytes: 0.01 10*3/uL (ref 0.00–0.07)
Basophils Absolute: 0 10*3/uL (ref 0.0–0.1)
Basophils Relative: 1 %
Eosinophils Absolute: 0.1 10*3/uL (ref 0.0–0.5)
Eosinophils Relative: 2 %
HCT: 42.5 % (ref 36.0–46.0)
Hemoglobin: 13.3 g/dL (ref 12.0–15.0)
Immature Granulocytes: 0 %
Lymphocytes Relative: 25 %
Lymphs Abs: 1.3 10*3/uL (ref 0.7–4.0)
MCH: 28.7 pg (ref 26.0–34.0)
MCHC: 31.3 g/dL (ref 30.0–36.0)
MCV: 91.8 fL (ref 80.0–100.0)
Monocytes Absolute: 0.5 10*3/uL (ref 0.1–1.0)
Monocytes Relative: 10 %
Neutro Abs: 3.2 10*3/uL (ref 1.7–7.7)
Neutrophils Relative %: 62 %
Platelet Count: 298 10*3/uL (ref 150–400)
RBC: 4.63 MIL/uL (ref 3.87–5.11)
RDW: 13.2 % (ref 11.5–15.5)
WBC Count: 5.1 10*3/uL (ref 4.0–10.5)
nRBC: 0 % (ref 0.0–0.2)

## 2020-03-09 LAB — CMP (CANCER CENTER ONLY)
ALT: 30 U/L (ref 0–44)
AST: 32 U/L (ref 15–41)
Albumin: 4 g/dL (ref 3.5–5.0)
Alkaline Phosphatase: 85 U/L (ref 38–126)
Anion gap: 5 (ref 5–15)
BUN: 6 mg/dL (ref 6–20)
CO2: 32 mmol/L (ref 22–32)
Calcium: 9.1 mg/dL (ref 8.9–10.3)
Chloride: 104 mmol/L (ref 98–111)
Creatinine: 0.67 mg/dL (ref 0.44–1.00)
GFR, Est AFR Am: >60 mL/min (ref >60)
GFR, Estimated: >60 mL/min (ref >60)
Glucose, Bld: 88 mg/dL (ref 70–99)
Potassium: 4.9 mmol/L (ref 3.5–5.1)
Sodium: 141 mmol/L (ref 135–145)
Total Bilirubin: 0.6 mg/dL (ref 0.3–1.2)
Total Protein: 7.6 g/dL (ref 6.5–8.1)

## 2020-03-09 MED ORDER — HYDROCOD POLST-CPM POLST ER 10-8 MG/5ML PO SUER: ORAL | 0 refills | Status: DC

## 2020-03-09 MED ORDER — CLINDAMYCIN PHOSPHATE 1 % EX LOTN: TOPICAL_LOTION | CUTANEOUS | 0 refills | Status: DC

## 2020-03-09 MED FILL — HYDROCODONE-CHLORPHEN ER SU: 10-8 | 28 days supply | Qty: 280 | Fill #0

## 2020-03-09 NOTE — Progress Notes (Signed)
[place] [phone]   [phone]  OFFICE PROGRESS NOTE  Golden Circle, FNP [address] [place] [address]  PRINCIPAL DIAGNOSIS: Local recurrence of non-small cell lung cancer, adenocarcinoma, initially diagnosed as stage IB (T2a N0 M0) in March of 2010.   PRIOR THERAPY:  1. Status post right lower lobectomy under the care of Dr. Cyndia Bent on 04/08/2009. 2. Status post palliative radiotherapy to the right lower lobe recurrent lung mass under the care of Dr. Lisbeth Renshaw, completed on June 08, 2011. 3. Systemic chemotherapy with carboplatin for AUC of 5 and Alimta 500 mg/M2 every 3 weeks. She is status post 3 cycles.  CURRENT THERAPY: Tarceva 150 mg by mouth daily, therapy beginning 07/24/2012. Status post approximately 89 months of therapy.   INTERVAL HISTORY: [name] 59 y.o. female returns to the clinic today for follow-up visit accompanied by her daughter [name].  The patient is feeling fine today with no concerning complaints except for mild hair loss as well as the inflammation of the eyebrows.  She also continues to have dry cough and requesting refill of her cough medication.  She denied having any current chest pain, shortness of breath or hemoptysis.  She denied having any fever or chills.  She has no nausea, vomiting, diarrhea or constipation.  She has no headache or visual changes.  She is here today for evaluation and repeat blood work.  MEDICAL HISTORY: Past Medical History:  Diagnosis Date  . Drug-induced skin rash 02/27/2017  . Encounter for antineoplastic chemotherapy 01/19/2016  . History of radiation therapy 05/02/11 to 06/08/11   right lung  . Lung cancer ([place])    right lower lobe adenocarcinoma    ALLERGIES:  is allergic to codeine and doxycycline.  MEDICATIONS:  Current Outpatient Medications  Medication Sig Dispense Refill  . acetaminophen (TYLENOL) 500 MG tablet Take 500 mg every 6 (six) hours as needed by  mouth.    Marland Kitchen albuterol (PROAIR HFA) 108 (90 Base) MCG/ACT inhaler Inhale 1-2 puffs into the lungs every 6 (six) hours as needed for wheezing or shortness of breath. 1 Inhaler 2  . chlorpheniramine-HYDROcodone (TUSSIONEX) 10-8 MG/5ML SUER TAKE 5 MLS BY MOUTH TWICE DAILY 280 mL 0  . clindamycin (CLEOCIN T) 1 % lotion Apply to the skin rash twice daily. 60 mL 0  . diltiazem (CARDIZEM CD) 120 MG 24 hr capsule Take 1 capsule (120 mg total) by mouth daily. 90 capsule 4  . doxycycline (VIBRA-TABS) 100 MG tablet Take 1 tablet (100 mg total) by mouth 2 (two) times daily. 14 tablet 0  . erlotinib (TARCEVA) 150 MG tablet Take 1 tablet (150 mg total) by mouth daily. Take on an empty stomach at least 1 hour before or 2 hours after food. 30 tablet 11  . lidocaine-prilocaine (EMLA) cream Apply 1 application topically as needed. 30 g 0  . methylPREDNISolone (MEDROL DOSEPAK) 4 MG TBPK tablet Use as instructed. 21 tablet 0   No current facility-administered medications for this visit.    SURGICAL HISTORY:  Past Surgical History:  Procedure Laterality Date  . LUNG LOBECTOMY  04/18/2009   RLL  . PORTACATH PLACEMENT  02/29/2012   Procedure: INSERTION PORT-A-CATH;  Surgeon: Nicanor Alcon, MD;  Location: [facility];  Service: Thoracic;  Laterality: Left;  PowerPort 8 F attachable in left internal jugular.    REVIEW OF SYSTEMS:  A comprehensive review of systems was negative except for: Respiratory: positive for cough  Integument/breast: positive for rash   PHYSICAL EXAMINATION: General appearance: alert, cooperative and no distress Head: Normocephalic, without obvious abnormality, atraumatic Neck: no adenopathy, no JVD, supple, symmetrical, trachea midline and thyroid not enlarged, symmetric, no tenderness/mass/nodules Lymph nodes: Cervical, supraclavicular, and axillary nodes normal. Resp: clear to auscultation bilaterally Back: symmetric, no curvature. ROM normal. No CVA tenderness. Cardio: regular rate and  rhythm, S1, S2 normal, no murmur, click, rub or gallop GI: soft, non-tender; bowel sounds normal; no masses,  no organomegaly Extremities: extremities normal, atraumatic, no cyanosis or edema  ECOG PERFORMANCE STATUS: 1 - Symptomatic but completely ambulatory  Blood pressure (!) 136/93, pulse 99, temperature 98.5 F (36.9 C), temperature source Temporal, resp. rate 18, height 4\' 9"  (1.448 m), weight 128 lb 3.2 oz (58.2 kg), last menstrual period 02/10/2012, SpO2 100 %.  LABORATORY DATA: Lab Results  Component Value Date   WBC 5.1 03/09/2020   HGB 13.3 03/09/2020   HCT 42.5 03/09/2020   MCV 91.8 03/09/2020   PLT 298 03/09/2020      Chemistry      Component Value Date/Time   NA 143 01/23/2020 1422   NA 143 10/30/2017 1031   K 4.5 01/23/2020 1422   K 4.3 10/30/2017 1031   CL 105 01/23/2020 1422   CL 102 04/30/2013 1002   CO2 29 01/23/2020 1422   CO2 29 10/30/2017 1031   BUN 12 01/23/2020 1422   BUN 14.6 10/30/2017 1031   CREATININE 0.74 01/23/2020 1422   CREATININE 0.7 10/30/2017 1031      Component Value Date/Time   CALCIUM 8.8 (L) 01/23/2020 1422   CALCIUM 9.2 10/30/2017 1031   ALKPHOS 81 01/23/2020 1422   ALKPHOS 103 10/30/2017 1031   AST 22 01/23/2020 1422   AST 20 10/30/2017 1031   ALT 18 01/23/2020 1422   ALT 14 10/30/2017 1031   BILITOT 0.7 01/23/2020 1422   BILITOT 0.79 10/30/2017 1031       RADIOGRAPHIC STUDIES: No results found.  ASSESSMENT AND PLAN:  This is a very pleasant 59 years old Asian female with recurrent non-small cell lung cancer, adenocarcinoma status post surgical resection followed by systemic chemotherapy with carboplatin and Alimta followed by disease progression and the patient was started on treatment with Tarceva 150 mg by mouth daily status post 89 months  The patient has been tolerating this treatment well with no concerning adverse effects except for the mild skin rash and alopecia. I recommended for her to continue her current  treatment with Tarceva with the same dose. For the skin rash I gave her a refill of clindamycin lotion. For the dry cough, I will give her a refill of Tussionex. The patient will come back for follow-up visit in 2 months for evaluation with repeat CT scan of the chest for restaging of her disease. She was advised to call immediately if she has any concerning symptoms in the interval.   All questions were answered. The patient knows to call the clinic with any problems, questions or concerns. We can certainly see the patient much sooner if necessary.  Disclaimer: This note was dictated with voice recognition software. Similar sounding words can inadvertently be transcribed and may not be corrected upon review.

## 2020-03-09 NOTE — Telephone Encounter (Signed)
Scheduled per los. Gave avs and calendar  
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
## 2020-03-16 ENCOUNTER — Other Ambulatory Visit: Payer: Self-pay

## 2020-03-16 ENCOUNTER — Ambulatory Visit (INDEPENDENT_AMBULATORY_CARE_PROVIDER_SITE_OTHER): Payer: Medicare Other | Admitting: Physician Assistant

## 2020-03-16 VITALS — BP 108/82 | HR 98 | Ht <= 58 in | Wt 127.0 lb

## 2020-03-16 DIAGNOSIS — I471 Supraventricular tachycardia: Secondary | ICD-10-CM | POA: Diagnosis not present

## 2020-03-16 MED ORDER — DILTIAZEM HCL ER COATED BEADS 120 MG PO CP24: 120.0000 mg | ORAL_CAPSULE | Freq: Every day | ORAL | 3 refills | Status: DC

## 2020-03-16 NOTE — Patient Instructions (Signed)
Medication Instructions:   Your physician recommends that you continue on your current medications as directed. Please refer to the Current Medication list given to you today.  *If you need a refill on your cardiac medications before your next appointment, please call your pharmacy*   Lab Work: [place]   If you have labs (blood work) drawn today and your tests are completely normal, you will receive your results only by: Marland Kitchen MyChart Message (if you have MyChart) OR . A paper copy in the mail If you have any lab test that is abnormal or we need to change your treatment, we will call you to review the results.   Testing/Procedures: NONE ORDERED  TODAY   Follow-Up: At [facility], you and your health needs are our priority.  As part of our continuing mission to provide you with exceptional heart care, we have created designated Provider Care Teams.  These Care Teams include your primary Cardiologist (physician) and Advanced Practice Providers (APPs -  Physician Assistants and Nurse Practitioners) who all work together to provide you with the care you need, when you need it.  We recommend signing up for the patient portal called "MyChart".  Sign up information is provided on this After Visit Summary.  MyChart is used to connect with patients for Virtual Visits (Telemedicine).  Patients are able to view lab/test results, encounter notes, upcoming appointments, etc.  Non-urgent messages can be sent to your provider as well.   To learn more about what you can do with MyChart, go to [email or web address].    Your next appointment:    6 month(s)  The format for your next appointment:   In Person  Provider:   You may see  Dr. Lequita Halt  or one of the following Advanced Practice Providers on your designated Care Team:    Chanetta Marshall, NP  Tommye Standard, PA-C  Legrand Como "Oda [place]ts, [place]    Other Instructions

## 2020-03-17 NOTE — Addendum Note (Signed)
Addended by: Claude Manges on: 03/17/2020 11:15 AM   Modules accepted: Orders

## 2020-03-24 MED FILL — CLINDAMYCIN PHOSP 1% LOTION: 1 | 30 days supply | Qty: 60 | Fill #0

## 2020-05-07 ENCOUNTER — Other Ambulatory Visit: Payer: Self-pay

## 2020-05-07 ENCOUNTER — Inpatient Hospital Stay: Payer: Medicare Other | Attending: Internal Medicine

## 2020-05-07 ENCOUNTER — Ambulatory Visit (HOSPITAL_COMMUNITY)
Admission: RE | Admit: 2020-05-07 | Discharge: 2020-05-07 | Disposition: A | Discharge status: Home / Self Care | Payer: Medicare Other | Source: Ambulatory Visit | Attending: Internal Medicine | Admitting: Internal Medicine

## 2020-05-07 ENCOUNTER — Encounter (HOSPITAL_COMMUNITY): Payer: Self-pay

## 2020-05-07 DIAGNOSIS — C3490 Malignant neoplasm of unspecified part of unspecified bronchus or lung: Secondary | ICD-10-CM | POA: Insufficient documentation

## 2020-05-07 DIAGNOSIS — C3431 Malignant neoplasm of lower lobe, right bronchus or lung: Secondary | ICD-10-CM | POA: Insufficient documentation

## 2020-05-07 DIAGNOSIS — Z9221 Personal history of antineoplastic chemotherapy: Secondary | ICD-10-CM | POA: Insufficient documentation

## 2020-05-07 DIAGNOSIS — Z923 Personal history of irradiation: Secondary | ICD-10-CM | POA: Insufficient documentation

## 2020-05-07 DIAGNOSIS — R21 Rash and other nonspecific skin eruption: Secondary | ICD-10-CM | POA: Insufficient documentation

## 2020-05-07 DIAGNOSIS — R05 Cough: Secondary | ICD-10-CM | POA: Diagnosis not present

## 2020-05-07 DIAGNOSIS — R918 Other nonspecific abnormal finding of lung field: Secondary | ICD-10-CM | POA: Diagnosis not present

## 2020-05-07 LAB — CBC WITH DIFFERENTIAL (CANCER CENTER ONLY)
Abs Immature Granulocytes: 0.02 10*3/uL (ref 0.00–0.07)
Basophils Absolute: 0 10*3/uL (ref 0.0–0.1)
Basophils Relative: 0 %
Eosinophils Absolute: 0 10*3/uL (ref 0.0–0.5)
Eosinophils Relative: 1 %
HCT: 40.4 % (ref 36.0–46.0)
Hemoglobin: 12.5 g/dL (ref 12.0–15.0)
Immature Granulocytes: 0 %
Lymphocytes Relative: 21 %
Lymphs Abs: 1.8 10*3/uL (ref 0.7–4.0)
MCH: 28.7 pg (ref 26.0–34.0)
MCHC: 30.9 g/dL (ref 30.0–36.0)
MCV: 92.7 fL (ref 80.0–100.0)
Monocytes Absolute: 0.7 10*3/uL (ref 0.1–1.0)
Monocytes Relative: 8 %
Neutro Abs: 6.1 10*3/uL (ref 1.7–7.7)
Neutrophils Relative %: 70 %
Platelet Count: 290 10*3/uL (ref 150–400)
RBC: 4.36 MIL/uL (ref 3.87–5.11)
RDW: 13.2 % (ref 11.5–15.5)
WBC Count: 8.7 10*3/uL (ref 4.0–10.5)
nRBC: 0 % (ref 0.0–0.2)

## 2020-05-07 LAB — CMP (CANCER CENTER ONLY)
ALT: 21 U/L (ref 0–44)
AST: 25 U/L (ref 15–41)
Albumin: 4 g/dL (ref 3.5–5.0)
Alkaline Phosphatase: 94 U/L (ref 38–126)
Anion gap: 7 (ref 5–15)
BUN: 10 mg/dL (ref 6–20)
CO2: 28 mmol/L (ref 22–32)
Calcium: 9.1 mg/dL (ref 8.9–10.3)
Chloride: 105 mmol/L (ref 98–111)
Creatinine: 0.73 mg/dL (ref 0.44–1.00)
GFR, Est AFR Am: >60 mL/min (ref >60)
GFR, Estimated: >60 mL/min (ref >60)
Glucose, Bld: 89 mg/dL (ref 70–99)
Potassium: 5 mmol/L (ref 3.5–5.1)
Sodium: 140 mmol/L (ref 135–145)
Total Bilirubin: 0.9 mg/dL (ref 0.3–1.2)
Total Protein: 7.5 g/dL (ref 6.5–8.1)

## 2020-05-07 MED ORDER — SODIUM CHLORIDE (PF) 0.9 % IJ SOLN
INTRAMUSCULAR | Status: AC
  Filled 2020-05-07: qty 50

## 2020-05-07 MED ORDER — IOHEXOL 300 MG/ML  SOLN
75.0000 mL | Freq: Once | INTRAMUSCULAR | Status: AC | PRN
  Administered 2020-05-07: 75 mL via INTRAVENOUS

## 2020-05-07 MED ORDER — HEPARIN SOD (PORK) LOCK FLUSH 100 UNIT/ML IV SOLN
500.0000 [IU] | Freq: Once | INTRAVENOUS | Status: AC
  Administered 2020-05-07: 500 [IU] via INTRAVENOUS

## 2020-05-07 MED ORDER — HEPARIN SOD (PORK) LOCK FLUSH 100 UNIT/ML IV SOLN
INTRAVENOUS | Status: AC
  Filled 2020-05-07: qty 5

## 2020-05-08 ENCOUNTER — Other Ambulatory Visit: Payer: Medicare Other

## 2020-05-11 ENCOUNTER — Telehealth: Payer: Self-pay | Admitting: Internal Medicine

## 2020-05-11 ENCOUNTER — Encounter: Payer: Self-pay | Admitting: Internal Medicine

## 2020-05-11 ENCOUNTER — Inpatient Hospital Stay (HOSPITAL_BASED_OUTPATIENT_CLINIC_OR_DEPARTMENT_OTHER): Payer: Medicare Other | Admitting: Internal Medicine

## 2020-05-11 ENCOUNTER — Other Ambulatory Visit: Payer: Self-pay

## 2020-05-11 VITALS — BP 137/86 | HR 108 | Temp 97.7°F | Resp 18 | Ht <= 58 in | Wt 124.5 lb

## 2020-05-11 DIAGNOSIS — Z5111 Encounter for antineoplastic chemotherapy: Secondary | ICD-10-CM

## 2020-05-11 DIAGNOSIS — L270 Generalized skin eruption due to drugs and medicaments taken internally: Secondary | ICD-10-CM | POA: Diagnosis not present

## 2020-05-11 DIAGNOSIS — C3431 Malignant neoplasm of lower lobe, right bronchus or lung: Secondary | ICD-10-CM

## 2020-05-11 MED ORDER — HYDROCOD POLST-CPM POLST ER 10-8 MG/5ML PO SUER: ORAL | 0 refills | Status: DC

## 2020-05-11 MED FILL — HYDROCODONE-CHLORPHEN ER SU: 10-8 | 28 days supply | Qty: 280 | Fill #0

## 2020-05-11 NOTE — Progress Notes (Signed)
[place] Telephone:(336) [phone]   Fax:(336) [phone]  OFFICE PROGRESS NOTE  Golden Circle, FNP [address] [place] [address]  PRINCIPAL DIAGNOSIS: Local recurrence of non-small cell lung cancer, adenocarcinoma, initially diagnosed as stage IB (T2a N0 M0) in March of 2010.   PRIOR THERAPY:  1. Status post right lower lobectomy under the care of Dr. Cyndia Bent on 04/08/2009. 2. Status post palliative radiotherapy to the right lower lobe recurrent lung mass under the care of Dr. Lisbeth Renshaw, completed on June 08, 2011. 3. Systemic chemotherapy with carboplatin for AUC of 5 and Alimta 500 mg/M2 every 3 weeks. She is status post 3 cycles.  CURRENT THERAPY: Tarceva 150 mg by mouth daily, therapy beginning 07/24/2012. Status post approximately 91 months of therapy.   INTERVAL HISTORY: [name] 59 y.o. female returns to the clinic today for follow-up visit accompanied by her interpreter.  The patient is feeling fine today with no concerning complaints except for the dry cough and she is requesting refill of her cough medication.  She denied having any current chest pain, shortness of breath or hemoptysis.  She denied having any fever or chills.  She has no nausea, vomiting but has few episodes of diarrhea with no constipation.  She denied having any headache or visual changes.  No significant weight loss or night sweats.  She continues to tolerate her treatment with Tarceva fairly well.  The patient is here today for evaluation with repeat CT scan of the chest for restaging of her disease.  MEDICAL HISTORY: Past Medical History:  Diagnosis Date  . Drug-induced skin rash 02/27/2017  . Encounter for antineoplastic chemotherapy 01/19/2016  . History of radiation therapy 05/02/11 to 06/08/11   right lung  . Lung cancer ([place])    right lower lobe adenocarcinoma    ALLERGIES:  is allergic to codeine and doxycycline.  MEDICATIONS:  Current Outpatient Medications   Medication Sig Dispense Refill  . acetaminophen (TYLENOL) 500 MG tablet Take 500 mg every 6 (six) hours as needed by mouth.    Marland Kitchen albuterol (PROAIR HFA) 108 (90 Base) MCG/ACT inhaler Inhale 1-2 puffs into the lungs every 6 (six) hours as needed for wheezing or shortness of breath. 1 Inhaler 2  . chlorpheniramine-HYDROcodone (TUSSIONEX) 10-8 MG/5ML SUER TAKE 5 MLS BY MOUTH TWICE DAILY 280 mL 0  . clindamycin (CLEOCIN T) 1 % lotion Apply to the skin rash twice daily. 60 mL 0  . diltiazem (CARDIZEM CD) 120 MG 24 hr capsule Take 1 capsule (120 mg total) by mouth daily. 90 capsule 3  . doxycycline (VIBRA-TABS) 100 MG tablet Take 1 tablet (100 mg total) by mouth 2 (two) times daily. 14 tablet 0  . erlotinib (TARCEVA) 150 MG tablet Take 1 tablet (150 mg total) by mouth daily. Take on an empty stomach at least 1 hour before or 2 hours after food. 30 tablet 11  . lidocaine-prilocaine (EMLA) cream Apply 1 application topically as needed. 30 g 0  . methylPREDNISolone (MEDROL DOSEPAK) 4 MG TBPK tablet Use as instructed. 21 tablet 0   No current facility-administered medications for this visit.    SURGICAL HISTORY:  Past Surgical History:  Procedure Laterality Date  . LUNG LOBECTOMY  04/18/2009   RLL  . PORTACATH PLACEMENT  02/29/2012   Procedure: INSERTION PORT-A-CATH;  Surgeon: Nicanor Alcon, MD;  Location: [place];  Service: Thoracic;  Laterality: Left;  PowerPort 8 F attachable in left internal jugular.  REVIEW OF SYSTEMS:  Constitutional: negative Eyes: negative Ears, nose, mouth, throat, and face: negative Respiratory: positive for cough Cardiovascular: negative Gastrointestinal: positive for diarrhea Genitourinary:negative Integument/breast: negative Hematologic/lymphatic: negative Musculoskeletal:negative Neurological: negative Behavioral/Psych: negative Endocrine: negative Allergic/Immunologic: negative   PHYSICAL EXAMINATION: General appearance: alert, cooperative and no distress  Head: Normocephalic, without obvious abnormality, atraumatic Neck: no adenopathy, no JVD, supple, symmetrical, trachea midline and thyroid not enlarged, symmetric, no tenderness/mass/nodules Lymph nodes: Cervical, supraclavicular, and axillary nodes normal. Resp: clear to auscultation bilaterally Back: symmetric, no curvature. ROM normal. No CVA tenderness. Cardio: regular rate and rhythm, S1, S2 normal, no murmur, click, rub or gallop GI: soft, non-tender; bowel sounds normal; no masses,  no organomegaly Extremities: extremities normal, atraumatic, no cyanosis or edema Neurologic: Alert and oriented X 3, normal strength and tone. Normal symmetric reflexes. Normal coordination and gait  ECOG PERFORMANCE STATUS: 1 - Symptomatic but completely ambulatory  Blood pressure 137/86, pulse (!) 108, temperature 97.7 F (36.5 C), temperature source Temporal, resp. rate 18, height 4\' 9"  (1.448 m), weight 124 lb 8 oz (56.5 kg), last menstrual period 02/10/2012, SpO2 97 %.  LABORATORY DATA: Lab Results  Component Value Date   WBC 8.7 05/07/2020   HGB 12.5 05/07/2020   HCT 40.4 05/07/2020   MCV 92.7 05/07/2020   PLT 290 05/07/2020      Chemistry      Component Value Date/Time   NA 140 05/07/2020 1500   NA 143 10/30/2017 1031   K 5.0 05/07/2020 1500   K 4.3 10/30/2017 1031   CL 105 05/07/2020 1500   CL 102 04/30/2013 1002   CO2 28 05/07/2020 1500   CO2 29 10/30/2017 1031   BUN 10 05/07/2020 1500   BUN 14.6 10/30/2017 1031   CREATININE 0.73 05/07/2020 1500   CREATININE 0.7 10/30/2017 1031      Component Value Date/Time   CALCIUM 9.1 05/07/2020 1500   CALCIUM 9.2 10/30/2017 1031   ALKPHOS 94 05/07/2020 1500   ALKPHOS 103 10/30/2017 1031   AST 25 05/07/2020 1500   AST 20 10/30/2017 1031   ALT 21 05/07/2020 1500   ALT 14 10/30/2017 1031   BILITOT 0.9 05/07/2020 1500   BILITOT 0.79 10/30/2017 1031       RADIOGRAPHIC STUDIES: CT Chest W Contrast  Result Date: 05/08/2020  CLINICAL DATA:  Primary Cancer Type: Lung Imaging Indication: Routine surveillance Interval therapy since last imaging? Yes Initial Cancer Diagnosis Date: 12/12/2008; Established by: Biopsy-proven Detailed Pathology: Stage IB non-small cell lung cancer, adenocarcinoma Primary Tumor location: Right upper lobe Recurrence? Yes; Date(s) of recurrence: 2012;Established by: Biopsy-proven Surgeries: Right lower lobectomy 04/08/2009. Chemotherapy: Yes; Ongoing?  Yes, daily Radiation therapy?  Yes; Date Range: 2012; Target: Right lung. 03/23/2016-03/31/2016; target: Right lung. EXAM: CT CHEST WITH CONTRAST TECHNIQUE: Multidetector CT imaging of the chest was performed during intravenous contrast administration. CONTRAST:  56mL OMNIPAQUE IOHEXOL 300 MG/ML  SOLN COMPARISON:  Most recent CT chest 01/23/2020.  01/16/2012 PET-CT. FINDINGS: Cardiovascular: Top-normal heart size. Trace pericardial effusion/thickening, stable. Left internal jugular Port-A-Cath terminates in the lower third of the SVC. Atherosclerotic nonaneurysmal thoracic aorta. Normal caliber pulmonary arteries. No central pulmonary emboli. Mediastinum/Nodes: No discrete thyroid nodules. Unremarkable esophagus. No pathologically enlarged axillary, mediastinal or hilar lymph nodes. Lungs/Pleura: No pneumothorax. Status post right lower lobectomy. Chronic loculated small basilar right pleural effusion with diffuse right pleural thickening, unchanged. No left pleural effusion. There are two similar subsolid right perihilar pulmonary nodules along the lateral margin of the site of radiation fibrosis,  both demonstrating growth on multiple prior consecutive chest CT studies, measuring 1.6 x 1.4 cm (series 7/image 64) compared to 1.4 x 1.2 cm on 01/23/2020 CT and 1.2 x 1.1 cm on 09/19/2019 CT and measuring 1.6 x 1.4 cm (series 7/image 64) compared to 1.3 x 1.3 cm on 01/23/2020 CT and 1.1 x 1.1 cm on 09/19/2019 CT. Stable 0.4 cm right mid lung ground-glass pulmonary  nodule (series 7/image 57). Otherwise stable sharply marginated right perihilar bandlike radiation fibrosis and stable masslike right infrahilar fibrosis. No new significant pulmonary nodules. Upper abdomen: No acute abnormality. Musculoskeletal: No aggressive appearing focal osseous lesions. Mild thoracic spondylosis. IMPRESSION: 1. Two enlarging subsolid right perihilar pulmonary nodules along the lateral margin of the site of radiation fibrosis, compatible with multifocal recurrent adenocarcinoma. 2. No adenopathy or additional sites of metastatic disease in the chest. 3. Stable chronic loculated small basilar right pleural effusion with right pleural thickening. Electronically Signed   By: Ilona Sorrel M.D.   On: 05/08/2020 10:36     ASSESSMENT AND PLAN:  This is a very pleasant 59 years old Asian female with recurrent non-small cell lung cancer, adenocarcinoma status post surgical resection followed by systemic chemotherapy with carboplatin and Alimta followed by disease progression and the patient was started on treatment with Tarceva 150 mg by mouth daily status post 91 months  The patient continues to tolerate her treatment with Tarceva fairly well with no significant adverse effect except for few episodes of diarrhea as well as intermittent skin rash mainly on the eyebrows. She had repeat CT scan of the chest performed recently.  I personally and independently reviewed the scan images and discussed the results with the patient today. The report indicated enlargement of 2 subsolid right pulmonary nodules at the margin of the site of radiation fibrosis.  I personally compared the current scan with the previous one and the changes are very mild. I recommended for the patient to continue her current treatment with Tarceva with the same dose for now. For the skin rash she will continue with the clindamycin lotion to the areas involved with. For the dry cough I will give her a refill of Tussionex. The  patient will come back for follow-up visit in 6 weeks for evaluation and repeat blood work. She was advised to call immediately if she has any concerning symptoms in the interval.  All questions were answered. The patient knows to call the clinic with any problems, questions or concerns. We can certainly see the patient much sooner if necessary.  Disclaimer: This note was dictated with voice recognition software. Similar sounding words can inadvertently be transcribed and may not be corrected upon review.

## 2020-05-11 NOTE — Telephone Encounter (Signed)
Scheduled appt per 6/14 los- pt daughter is aware

## 2020-06-22 ENCOUNTER — Other Ambulatory Visit: Payer: Self-pay

## 2020-06-22 ENCOUNTER — Inpatient Hospital Stay: Payer: Medicare Other | Attending: Internal Medicine | Admitting: Internal Medicine

## 2020-06-22 ENCOUNTER — Telehealth: Payer: Self-pay | Admitting: Internal Medicine

## 2020-06-22 ENCOUNTER — Encounter: Payer: Self-pay | Admitting: Internal Medicine

## 2020-06-22 ENCOUNTER — Inpatient Hospital Stay: Payer: Medicare Other

## 2020-06-22 VITALS — BP 123/91 | HR 100 | Temp 97.2°F | Resp 18 | Ht <= 58 in | Wt 123.6 lb

## 2020-06-22 DIAGNOSIS — I10 Essential (primary) hypertension: Secondary | ICD-10-CM | POA: Diagnosis not present

## 2020-06-22 DIAGNOSIS — C3431 Malignant neoplasm of lower lobe, right bronchus or lung: Secondary | ICD-10-CM

## 2020-06-22 DIAGNOSIS — Z923 Personal history of irradiation: Secondary | ICD-10-CM | POA: Diagnosis not present

## 2020-06-22 DIAGNOSIS — Z5111 Encounter for antineoplastic chemotherapy: Secondary | ICD-10-CM | POA: Diagnosis not present

## 2020-06-22 DIAGNOSIS — Z7952 Long term (current) use of systemic steroids: Secondary | ICD-10-CM | POA: Diagnosis not present

## 2020-06-22 DIAGNOSIS — Z79899 Other long term (current) drug therapy: Secondary | ICD-10-CM | POA: Insufficient documentation

## 2020-06-22 LAB — CBC WITH DIFFERENTIAL (CANCER CENTER ONLY)
Abs Immature Granulocytes: 0.01 10*3/uL (ref 0.00–0.07)
Basophils Absolute: 0 10*3/uL (ref 0.0–0.1)
Basophils Relative: 0 %
Eosinophils Absolute: 0.1 10*3/uL (ref 0.0–0.5)
Eosinophils Relative: 1 %
HCT: 44.8 % (ref 36.0–46.0)
Hemoglobin: 14.3 g/dL (ref 12.0–15.0)
Immature Granulocytes: 0 %
Lymphocytes Relative: 17 %
Lymphs Abs: 1.3 10*3/uL (ref 0.7–4.0)
MCH: 28.1 pg (ref 26.0–34.0)
MCHC: 31.9 g/dL (ref 30.0–36.0)
MCV: 88.2 fL (ref 80.0–100.0)
Monocytes Absolute: 0.5 10*3/uL (ref 0.1–1.0)
Monocytes Relative: 6 %
Neutro Abs: 5.7 10*3/uL (ref 1.7–7.7)
Neutrophils Relative %: 76 %
Platelet Count: 325 10*3/uL (ref 150–400)
RBC: 5.08 MIL/uL (ref 3.87–5.11)
RDW: 13 % (ref 11.5–15.5)
WBC Count: 7.6 10*3/uL (ref 4.0–10.5)
nRBC: 0 % (ref 0.0–0.2)

## 2020-06-22 LAB — CMP (CANCER CENTER ONLY)
ALT: 20 U/L (ref 0–44)
AST: 24 U/L (ref 15–41)
Albumin: 4.2 g/dL (ref 3.5–5.0)
Alkaline Phosphatase: 96 U/L (ref 38–126)
Anion gap: 9 (ref 5–15)
BUN: 10 mg/dL (ref 6–20)
CO2: 26 mmol/L (ref 22–32)
Calcium: 9.5 mg/dL (ref 8.9–10.3)
Chloride: 105 mmol/L (ref 98–111)
Creatinine: 0.74 mg/dL (ref 0.44–1.00)
GFR, Est AFR Am: >60 mL/min (ref >60)
GFR, Estimated: >60 mL/min (ref >60)
Glucose, Bld: 120 mg/dL — ABNORMAL HIGH (ref 70–99)
Potassium: 4.3 mmol/L (ref 3.5–5.1)
Sodium: 140 mmol/L (ref 135–145)
Total Bilirubin: 1.1 mg/dL (ref 0.3–1.2)
Total Protein: 8.4 g/dL — ABNORMAL HIGH (ref 6.5–8.1)

## 2020-06-22 MED ORDER — HYDROCOD POLST-CPM POLST ER 10-8 MG/5ML PO SUER: ORAL | 0 refills | Status: DC

## 2020-06-22 MED FILL — HYDROCODONE-CHLORPHEN ER SU: 10-8 | 28 days supply | Qty: 280 | Fill #0

## 2020-06-22 NOTE — Telephone Encounter (Signed)
Scheduled per 07/26 los, patient has received updated calender.

## 2020-06-22 NOTE — Progress Notes (Signed)
[facility] Telephone:([phone]   Fax:(336) [phone]  OFFICE PROGRESS NOTE  Golden Circle, FNP [address] [place] [address]  PRINCIPAL DIAGNOSIS: Local recurrence of non-small cell lung cancer, adenocarcinoma, initially diagnosed as stage IB (T2a N0 M0) in March of 2010.   PRIOR THERAPY:  1. Status post right lower lobectomy under the care of Dr. Cyndia Bent on 04/08/2009. 2. Status post palliative radiotherapy to the right lower lobe recurrent lung mass under the care of Dr. Lisbeth Renshaw, completed on June 08, 2011. 3. Systemic chemotherapy with carboplatin for AUC of 5 and Alimta 500 mg/M2 every 3 weeks. She is status post 3 cycles.  CURRENT THERAPY: Tarceva 150 mg by mouth daily, therapy beginning 07/24/2012. Status post approximately 93 months of therapy.   INTERVAL HISTORY: [name] 59 y.o. female returns to the clinic today for follow-up visit accompanied by her interpreter.  The patient is feeling fine today with no concerning complaints except for the intermittent diarrhea as well as skin rash.  She continues to have the dry cough and requesting refill of Tussionex.  She denied having any chest pain, shortness of breath or hemoptysis.  She denied having any fever or chills.  She has no nausea, vomiting, diarrhea or constipation.  She has no headache or visual changes.  She is here today for evaluation and repeat blood work.  MEDICAL HISTORY: Past Medical History:  Diagnosis Date  . Drug-induced skin rash 02/27/2017  . Encounter for antineoplastic chemotherapy 01/19/2016  . History of radiation therapy 05/02/11 to 06/08/11   right lung  . Lung cancer ([place])    right lower lobe adenocarcinoma    ALLERGIES:  is allergic to codeine and doxycycline.  MEDICATIONS:  Current Outpatient Medications  Medication Sig Dispense Refill  . acetaminophen (TYLENOL) 500 MG tablet Take 500 mg every 6 (six) hours as needed by mouth.    Marland Kitchen albuterol (PROAIR HFA)  108 (90 Base) MCG/ACT inhaler Inhale 1-2 puffs into the lungs every 6 (six) hours as needed for wheezing or shortness of breath. 1 Inhaler 2  . chlorpheniramine-HYDROcodone (TUSSIONEX) 10-8 MG/5ML SUER TAKE 5 MLS BY MOUTH TWICE DAILY 280 mL 0  . clindamycin (CLEOCIN T) 1 % lotion Apply to the skin rash twice daily. 60 mL 0  . diltiazem (CARDIZEM CD) 120 MG 24 hr capsule Take 1 capsule (120 mg total) by mouth daily. 90 capsule 3  . doxycycline (VIBRA-TABS) 100 MG tablet Take 1 tablet (100 mg total) by mouth 2 (two) times daily. 14 tablet 0  . erlotinib (TARCEVA) 150 MG tablet Take 1 tablet (150 mg total) by mouth daily. Take on an empty stomach at least 1 hour before or 2 hours after food. 30 tablet 11  . lidocaine-prilocaine (EMLA) cream Apply 1 application topically as needed. 30 g 0  . methylPREDNISolone (MEDROL DOSEPAK) 4 MG TBPK tablet Use as instructed. 21 tablet 0   No current facility-administered medications for this visit.    SURGICAL HISTORY:  Past Surgical History:  Procedure Laterality Date  . LUNG LOBECTOMY  04/18/2009   RLL  . PORTACATH PLACEMENT  02/29/2012   Procedure: INSERTION PORT-A-CATH;  Surgeon: Nicanor Alcon, MD;  Location: [facility];  Service: Thoracic;  Laterality: Left;  PowerPort 8 F attachable in left internal jugular.    REVIEW OF SYSTEMS:  A comprehensive review of systems was negative except for: Respiratory: positive for cough Gastrointestinal: positive for diarrhea   PHYSICAL  EXAMINATION: General appearance: alert, cooperative and no distress Head: Normocephalic, without obvious abnormality, atraumatic Neck: no adenopathy, no JVD, supple, symmetrical, trachea midline and thyroid not enlarged, symmetric, no tenderness/mass/nodules Lymph nodes: Cervical, supraclavicular, and axillary nodes normal. Resp: clear to auscultation bilaterally Back: symmetric, no curvature. ROM normal. No CVA tenderness. Cardio: regular rate and rhythm, S1, S2 normal, no murmur,  click, rub or gallop GI: soft, non-tender; bowel sounds normal; no masses,  no organomegaly Extremities: extremities normal, atraumatic, no cyanosis or edema  ECOG PERFORMANCE STATUS: 1 - Symptomatic but completely ambulatory  Blood pressure (!) 123/91, pulse 100, temperature (!) 97.2 F (36.2 C), temperature source Temporal, resp. rate 18, height 4\' 9"  (1.448 m), weight 123 lb 9.6 oz (56.1 kg), last menstrual period 02/10/2012, SpO2 100 %.  LABORATORY DATA: Lab Results  Component Value Date   WBC 7.6 06/22/2020   HGB 14.3 06/22/2020   HCT 44.8 06/22/2020   MCV 88.2 06/22/2020   PLT 325 06/22/2020      Chemistry      Component Value Date/Time   NA 140 05/07/2020 1500   NA 143 10/30/2017 1031   K 5.0 05/07/2020 1500   K 4.3 10/30/2017 1031   CL 105 05/07/2020 1500   CL 102 04/30/2013 1002   CO2 28 05/07/2020 1500   CO2 29 10/30/2017 1031   BUN 10 05/07/2020 1500   BUN 14.6 10/30/2017 1031   CREATININE 0.73 05/07/2020 1500   CREATININE 0.7 10/30/2017 1031      Component Value Date/Time   CALCIUM 9.1 05/07/2020 1500   CALCIUM 9.2 10/30/2017 1031   ALKPHOS 94 05/07/2020 1500   ALKPHOS 103 10/30/2017 1031   AST 25 05/07/2020 1500   AST 20 10/30/2017 1031   ALT 21 05/07/2020 1500   ALT 14 10/30/2017 1031   BILITOT 0.9 05/07/2020 1500   BILITOT 0.79 10/30/2017 1031       RADIOGRAPHIC STUDIES: No results found.   ASSESSMENT AND PLAN:  This is a very pleasant 59 years old Asian female with recurrent non-small cell lung cancer, adenocarcinoma status post surgical resection followed by systemic chemotherapy with carboplatin and Alimta followed by disease progression and the patient was started on treatment with Tarceva 150 mg by mouth daily status post 93 months  The patient continues to tolerate this treatment well with no concerning adverse effects except for mild skin rash and diarrhea. I recommended for her to continue her current treatment with Tarceva with the same  dose. For the skin rash she will continue with the clindamycin lotion to the areas involved with. For the dry cough I will give her a refill of Tussionex. I will see her back for follow-up visit in 2 months for evaluation and repeat blood work. She was advised to call immediately if she has any concerning symptoms in the interval. All questions were answered. The patient knows to call the clinic with any problems, questions or concerns. We can certainly see the patient much sooner if necessary.  Disclaimer: This note was dictated with voice recognition software. Similar sounding words can inadvertently be transcribed and may not be corrected upon review.

## 2020-06-23 ENCOUNTER — Other Ambulatory Visit: Payer: Self-pay | Admitting: Nurse Practitioner

## 2020-08-24 ENCOUNTER — Inpatient Hospital Stay: Payer: Medicare Other | Attending: Internal Medicine

## 2020-08-24 ENCOUNTER — Encounter: Payer: Self-pay | Admitting: Internal Medicine

## 2020-08-24 ENCOUNTER — Telehealth: Payer: Self-pay | Admitting: Internal Medicine

## 2020-08-24 ENCOUNTER — Other Ambulatory Visit: Payer: Self-pay

## 2020-08-24 ENCOUNTER — Inpatient Hospital Stay: Payer: Medicare Other

## 2020-08-24 ENCOUNTER — Other Ambulatory Visit: Payer: Self-pay | Admitting: Medical Oncology

## 2020-08-24 ENCOUNTER — Inpatient Hospital Stay (HOSPITAL_BASED_OUTPATIENT_CLINIC_OR_DEPARTMENT_OTHER): Payer: Medicare Other | Admitting: Internal Medicine

## 2020-08-24 ENCOUNTER — Ambulatory Visit: Payer: Medicare Other

## 2020-08-24 VITALS — BP 127/63 | HR 93 | Temp 98.0°F | Resp 16 | Ht <= 58 in | Wt 124.9 lb

## 2020-08-24 DIAGNOSIS — E875 Hyperkalemia: Secondary | ICD-10-CM | POA: Diagnosis not present

## 2020-08-24 DIAGNOSIS — C3431 Malignant neoplasm of lower lobe, right bronchus or lung: Secondary | ICD-10-CM

## 2020-08-24 DIAGNOSIS — Z23 Encounter for immunization: Secondary | ICD-10-CM | POA: Diagnosis not present

## 2020-08-24 DIAGNOSIS — C3490 Malignant neoplasm of unspecified part of unspecified bronchus or lung: Secondary | ICD-10-CM

## 2020-08-24 DIAGNOSIS — I10 Essential (primary) hypertension: Secondary | ICD-10-CM

## 2020-08-24 DIAGNOSIS — Z5111 Encounter for antineoplastic chemotherapy: Secondary | ICD-10-CM | POA: Diagnosis not present

## 2020-08-24 LAB — CBC WITH DIFFERENTIAL (CANCER CENTER ONLY)
Abs Immature Granulocytes: 0.02 10*3/uL (ref 0.00–0.07)
Basophils Absolute: 0 10*3/uL (ref 0.0–0.1)
Basophils Relative: 0 %
Eosinophils Absolute: 0.1 10*3/uL (ref 0.0–0.5)
Eosinophils Relative: 1 %
HCT: 41.4 % (ref 36.0–46.0)
Hemoglobin: 12.9 g/dL (ref 12.0–15.0)
Immature Granulocytes: 0 %
Lymphocytes Relative: 25 %
Lymphs Abs: 2 10*3/uL (ref 0.7–4.0)
MCH: 27.7 pg (ref 26.0–34.0)
MCHC: 31.2 g/dL (ref 30.0–36.0)
MCV: 88.8 fL (ref 80.0–100.0)
Monocytes Absolute: 0.7 10*3/uL (ref 0.1–1.0)
Monocytes Relative: 9 %
Neutro Abs: 5 10*3/uL (ref 1.7–7.7)
Neutrophils Relative %: 65 %
Platelet Count: 268 10*3/uL (ref 150–400)
RBC: 4.66 MIL/uL (ref 3.87–5.11)
RDW: 14.7 % (ref 11.5–15.5)
WBC Count: 7.9 10*3/uL (ref 4.0–10.5)
nRBC: 0 % (ref 0.0–0.2)

## 2020-08-24 LAB — BASIC METABOLIC PANEL - CANCER CENTER ONLY
Anion gap: 5 (ref 5–15)
BUN: 10 mg/dL (ref 6–20)
CO2: 33 mmol/L — ABNORMAL HIGH (ref 22–32)
Calcium: 9.3 mg/dL (ref 8.9–10.3)
Chloride: 102 mmol/L (ref 98–111)
Creatinine: 0.69 mg/dL (ref 0.44–1.00)
GFR, Est AFR Am: >60 mL/min (ref >60)
GFR, Estimated: >60 mL/min (ref >60)
Glucose, Bld: 95 mg/dL (ref 70–99)
Potassium: 4.9 mmol/L (ref 3.5–5.1)
Sodium: 140 mmol/L (ref 135–145)

## 2020-08-24 LAB — CMP (CANCER CENTER ONLY)
ALT: 30 U/L (ref 0–44)
AST: 34 U/L (ref 15–41)
Albumin: 3.9 g/dL (ref 3.5–5.0)
Alkaline Phosphatase: 99 U/L (ref 38–126)
Anion gap: 3 — ABNORMAL LOW (ref 5–15)
BUN: 11 mg/dL (ref 6–20)
CO2: 31 mmol/L (ref 22–32)
Calcium: 9.4 mg/dL (ref 8.9–10.3)
Chloride: 103 mmol/L (ref 98–111)
Creatinine: 0.74 mg/dL (ref 0.44–1.00)
GFR, Est AFR Am: >60 mL/min (ref >60)
GFR, Estimated: >60 mL/min (ref >60)
Glucose, Bld: 111 mg/dL — ABNORMAL HIGH (ref 70–99)
Potassium: 5.5 mmol/L — ABNORMAL HIGH (ref 3.5–5.1)
Sodium: 137 mmol/L (ref 135–145)
Total Bilirubin: 1 mg/dL (ref 0.3–1.2)
Total Protein: 7.7 g/dL (ref 6.5–8.1)

## 2020-08-24 MED ORDER — INFLUENZA VAC SPLIT QUAD 0.5 ML IM SUSY
PREFILLED_SYRINGE | INTRAMUSCULAR | Status: AC
  Filled 2020-08-24: qty 0.5

## 2020-08-24 MED ORDER — CLINDAMYCIN PHOSPHATE 1 % EX LOTN: TOPICAL_LOTION | CUTANEOUS | 0 refills | Status: DC

## 2020-08-24 MED ORDER — HYDROCOD POLST-CPM POLST ER 10-8 MG/5ML PO SUER: ORAL | 0 refills | Status: DC

## 2020-08-24 MED ORDER — INFLUENZA VAC SPLIT QUAD 0.5 ML IM SUSY
0.5000 mL | PREFILLED_SYRINGE | Freq: Once | INTRAMUSCULAR | Status: AC
  Administered 2020-08-24: 0.5 mL via INTRAMUSCULAR

## 2020-08-24 MED FILL — HYDROCODONE-CHLORPHEN ER SU: 10-8 | 28 days supply | Qty: 280 | Fill #0

## 2020-08-24 NOTE — Progress Notes (Signed)
   Covid-19 Vaccination Clinic  Name:  Navya Timmons    MRN: [id number] DOB: [birth year]  08/24/2020  Ms. Bennion was observed post Covid-19 immunization for 15 minutes without incident. She was provided with Vaccine Information Sheet and instruction to access the V-Safe system.   Ms. Mahr was instructed to call 911 with any severe reactions post vaccine: Marland Kitchen Difficulty breathing  . Swelling of face and throat  . A fast heartbeat  . A bad rash all over body  . Dizziness and weakness

## 2020-08-24 NOTE — Telephone Encounter (Signed)
Scheduled per los. Gave avs and calendar  

## 2020-08-24 NOTE — Addendum Note (Signed)
Addended by: Ardeen Garland on: 08/24/2020 12:01 PM   Modules accepted: Orders

## 2020-08-24 NOTE — Progress Notes (Signed)
#        [facility] Telephone:(336) [phone]   Fax:(336) [phone]  OFFICE PROGRESS NOTE  Golden Circle, FNP [address] [place] [address]  PRINCIPAL DIAGNOSIS: Local recurrence of non-small cell lung cancer, adenocarcinoma, initially diagnosed as stage IB (T2a N0 M0) in March of 2010.   PRIOR THERAPY:  1. Status post right lower lobectomy under the care of Dr. Cyndia Bent on 04/08/2009. 2. Status post palliative radiotherapy to the right lower lobe recurrent lung mass under the care of Dr. Lisbeth Renshaw, completed on June 08, 2011. 3. Systemic chemotherapy with carboplatin for AUC of 5 and Alimta 500 mg/M2 every 3 weeks. She is status post 3 cycles.  CURRENT THERAPY: Tarceva 150 mg by mouth daily, therapy beginning 07/24/2012. Status post approximately 95 months of therapy.   INTERVAL HISTORY: [name] 59 y.o. female returns to the clinic today for follow-up visit by her daughter.  The patient is feeling fine today with no concerning complaints except for salt taste in her mouth especially in the morning.  She also has dry skin.  She denied having any nausea, vomiting, diarrhea or constipation.  She has no chest pain, shortness of breath but continues to have dry cough with no hemoptysis.  She is requesting refill of her cough medication.  She denied having any recent weight loss or night sweats.  She continues to tolerate her treatment with Tarceva fairly well.  The patient is here today for evaluation and repeat blood work.  MEDICAL HISTORY: Past Medical History:  Diagnosis Date  . Drug-induced skin rash 02/27/2017  . Encounter for antineoplastic chemotherapy 01/19/2016  . History of radiation therapy 05/02/11 to 06/08/11   right lung  . Lung cancer ([place])    right lower lobe adenocarcinoma    ALLERGIES:  is allergic to codeine and doxycycline.  MEDICATIONS:  Current Outpatient Medications  Medication Sig Dispense Refill  . acetaminophen (TYLENOL) 500 MG tablet  Take 500 mg every 6 (six) hours as needed by mouth.    Marland Kitchen albuterol (PROAIR HFA) 108 (90 Base) MCG/ACT inhaler Inhale 1-2 puffs into the lungs every 6 (six) hours as needed for wheezing or shortness of breath. 1 Inhaler 2  . chlorpheniramine-HYDROcodone (TUSSIONEX) 10-8 MG/5ML SUER TAKE 5 MLS BY MOUTH TWICE DAILY 280 mL 0  . clindamycin (CLEOCIN T) 1 % lotion Apply to the skin rash twice daily. 60 mL 0  . diltiazem (TIAZAC) 120 MG 24 hr capsule Take 120 mg by mouth daily.    Marland Kitchen doxycycline (VIBRA-TABS) 100 MG tablet Take 1 tablet (100 mg total) by mouth 2 (two) times daily. 14 tablet 0  . erlotinib (TARCEVA) 150 MG tablet Take 1 tablet (150 mg total) by mouth daily. Take on an empty stomach at least 1 hour before or 2 hours after food. 30 tablet 11  . lidocaine-prilocaine (EMLA) cream Apply 1 application topically as needed. 30 g 0  . methylPREDNISolone (MEDROL DOSEPAK) 4 MG TBPK tablet Use as instructed. 21 tablet 0   No current facility-administered medications for this visit.    SURGICAL HISTORY:  Past Surgical History:  Procedure Laterality Date  . LUNG LOBECTOMY  04/18/2009   RLL  . PORTACATH PLACEMENT  02/29/2012   Procedure: INSERTION PORT-A-CATH;  Surgeon: Nicanor Alcon, MD;  Location: [facility];  Service: Thoracic;  Laterality: Left;  PowerPort 8 F attachable in left internal jugular.    REVIEW OF SYSTEMS:  A comprehensive review of systems was  negative except for: Constitutional: positive for fatigue Ears, nose, mouth, throat, and face: positive for sore mouth Respiratory: positive for cough   PHYSICAL EXAMINATION: General appearance: alert, cooperative and no distress Head: Normocephalic, without obvious abnormality, atraumatic Neck: no adenopathy, no JVD, supple, symmetrical, trachea midline and thyroid not enlarged, symmetric, no tenderness/mass/nodules Lymph nodes: Cervical, supraclavicular, and axillary nodes normal. Resp: clear to auscultation bilaterally Back: symmetric, no  curvature. ROM normal. No CVA tenderness. Cardio: regular rate and rhythm, S1, S2 normal, no murmur, click, rub or gallop GI: soft, non-tender; bowel sounds normal; no masses,  no organomegaly Extremities: extremities normal, atraumatic, no cyanosis or edema  ECOG PERFORMANCE STATUS: 1 - Symptomatic but completely ambulatory  Blood pressure 127/63, pulse 93, temperature 98 F (36.7 C), temperature source Tympanic, resp. rate 16, height 4\' 9"  (1.448 m), weight 124 lb 14.4 oz (56.7 kg), last menstrual period 02/10/2012, SpO2 99 %.  LABORATORY DATA: Lab Results  Component Value Date   WBC 7.9 08/24/2020   HGB 12.9 08/24/2020   HCT 41.4 08/24/2020   MCV 88.8 08/24/2020   PLT 268 08/24/2020      Chemistry      Component Value Date/Time   NA 137 08/24/2020 1005   NA 143 10/30/2017 1031   K 5.5 (H) 08/24/2020 1005   K 4.3 10/30/2017 1031   CL 103 08/24/2020 1005   CL 102 04/30/2013 1002   CO2 31 08/24/2020 1005   CO2 29 10/30/2017 1031   BUN 11 08/24/2020 1005   BUN 14.6 10/30/2017 1031   CREATININE 0.74 08/24/2020 1005   CREATININE 0.7 10/30/2017 1031      Component Value Date/Time   CALCIUM 9.4 08/24/2020 1005   CALCIUM 9.2 10/30/2017 1031   ALKPHOS 99 08/24/2020 1005   ALKPHOS 103 10/30/2017 1031   AST 34 08/24/2020 1005   AST 20 10/30/2017 1031   ALT 30 08/24/2020 1005   ALT 14 10/30/2017 1031   BILITOT 1.0 08/24/2020 1005   BILITOT 0.79 10/30/2017 1031       RADIOGRAPHIC STUDIES: No results found.   ASSESSMENT AND PLAN:  This is a very pleasant 59 years old Asian female with recurrent non-small cell lung cancer, adenocarcinoma status post surgical resection followed by systemic chemotherapy with carboplatin and Alimta followed by disease progression and the patient was started on treatment with Tarceva 150 mg by mouth daily status post 95 months  She continues to tolerate this treatment well with no concerning adverse effects. I recommended for her to continue  her current treatment with Tarceva with the same dose. For the cough I will give her a refill of Tussionex. For the sore mouth and salty taste, she was advised to use mouthwash on regular basis especially in the morning. For the hyperkalemia, I will repeat her BMP today.  She was also advised to avoid any potassium rich diet. The patient will come back for follow-up visit in 2 months for evaluation with repeat CT scan of the chest for restaging of her disease. All questions were answered. The patient knows to call the clinic with any problems, questions or concerns. We can certainly see the patient much sooner if necessary.  Disclaimer: This note was dictated with voice recognition software. Similar sounding words can inadvertently be transcribed and may not be corrected upon review.

## 2020-09-23 ENCOUNTER — Other Ambulatory Visit: Payer: Self-pay | Admitting: Medical Oncology

## 2020-09-23 ENCOUNTER — Other Ambulatory Visit: Payer: Self-pay | Admitting: Internal Medicine

## 2020-09-23 DIAGNOSIS — C3431 Malignant neoplasm of lower lobe, right bronchus or lung: Secondary | ICD-10-CM

## 2020-09-23 MED ORDER — HYDROCOD POLST-CPM POLST ER 10-8 MG/5ML PO SUER: ORAL | 0 refills | Status: DC

## 2020-09-23 NOTE — Telephone Encounter (Signed)
Requests refill cough syrup.

## 2020-09-28 ENCOUNTER — Other Ambulatory Visit: Payer: Self-pay | Admitting: Medical Oncology

## 2020-09-28 ENCOUNTER — Other Ambulatory Visit: Payer: Self-pay | Admitting: Physician Assistant

## 2020-09-28 ENCOUNTER — Telehealth: Payer: Self-pay | Admitting: Medical Oncology

## 2020-09-28 DIAGNOSIS — C3431 Malignant neoplasm of lower lobe, right bronchus or lung: Secondary | ICD-10-CM

## 2020-09-28 MED ORDER — HYDROCOD POLST-CPM POLST ER 10-8 MG/5ML PO SUER: ORAL | 0 refills | Status: DC

## 2020-09-28 MED FILL — HYDROCODONE-CHLORPHEN ER SU: 10-8 | 28 days supply | Qty: 280 | Fill #0

## 2020-09-28 NOTE — Telephone Encounter (Signed)
Tussionex rx -Pt cannot afford [facility] price .Please change rx to [facility]. I cancelled [facility].

## 2020-09-28 NOTE — Telephone Encounter (Signed)
Change pharmacy

## 2020-10-23 ENCOUNTER — Inpatient Hospital Stay: Payer: Medicare Other | Attending: Internal Medicine

## 2020-10-23 ENCOUNTER — Inpatient Hospital Stay: Payer: Medicare Other

## 2020-10-23 ENCOUNTER — Other Ambulatory Visit: Payer: Medicare Other

## 2020-10-23 ENCOUNTER — Ambulatory Visit (HOSPITAL_COMMUNITY): Payer: Medicare Other

## 2020-10-23 ENCOUNTER — Other Ambulatory Visit: Payer: Self-pay

## 2020-10-23 ENCOUNTER — Ambulatory Visit (HOSPITAL_COMMUNITY)
Admission: RE | Admit: 2020-10-23 | Discharge: 2020-10-23 | Disposition: A | Discharge status: Home / Self Care | Payer: Medicare Other | Source: Ambulatory Visit | Attending: Internal Medicine | Admitting: Internal Medicine

## 2020-10-23 DIAGNOSIS — Z923 Personal history of irradiation: Secondary | ICD-10-CM | POA: Diagnosis not present

## 2020-10-23 DIAGNOSIS — R059 Cough, unspecified: Secondary | ICD-10-CM | POA: Diagnosis not present

## 2020-10-23 DIAGNOSIS — C3431 Malignant neoplasm of lower lobe, right bronchus or lung: Secondary | ICD-10-CM | POA: Diagnosis not present

## 2020-10-23 DIAGNOSIS — S42002A Fracture of unspecified part of left clavicle, initial encounter for closed fracture: Secondary | ICD-10-CM | POA: Diagnosis not present

## 2020-10-23 DIAGNOSIS — C3490 Malignant neoplasm of unspecified part of unspecified bronchus or lung: Secondary | ICD-10-CM | POA: Insufficient documentation

## 2020-10-23 DIAGNOSIS — Z79899 Other long term (current) drug therapy: Secondary | ICD-10-CM | POA: Insufficient documentation

## 2020-10-23 DIAGNOSIS — M47814 Spondylosis without myelopathy or radiculopathy, thoracic region: Secondary | ICD-10-CM | POA: Diagnosis not present

## 2020-10-23 DIAGNOSIS — J929 Pleural plaque without asbestos: Secondary | ICD-10-CM | POA: Diagnosis not present

## 2020-10-23 DIAGNOSIS — K0889 Other specified disorders of teeth and supporting structures: Secondary | ICD-10-CM | POA: Insufficient documentation

## 2020-10-23 DIAGNOSIS — Z95828 Presence of other vascular implants and grafts: Secondary | ICD-10-CM

## 2020-10-23 LAB — CMP (CANCER CENTER ONLY)
ALT: 18 U/L (ref 0–44)
AST: 26 U/L (ref 15–41)
Albumin: 3.8 g/dL (ref 3.5–5.0)
Alkaline Phosphatase: 99 U/L (ref 38–126)
Anion gap: 12 (ref 5–15)
BUN: 13 mg/dL (ref 6–20)
CO2: 26 mmol/L (ref 22–32)
Calcium: 9.2 mg/dL (ref 8.9–10.3)
Chloride: 103 mmol/L (ref 98–111)
Creatinine: 0.61 mg/dL (ref 0.44–1.00)
GFR, Estimated: >60 mL/min (ref >60)
Glucose, Bld: 99 mg/dL (ref 70–99)
Potassium: 4 mmol/L (ref 3.5–5.1)
Sodium: 141 mmol/L (ref 135–145)
Total Bilirubin: 0.4 mg/dL (ref 0.3–1.2)
Total Protein: 7.6 g/dL (ref 6.5–8.1)

## 2020-10-23 LAB — CBC WITH DIFFERENTIAL (CANCER CENTER ONLY)
Abs Immature Granulocytes: 0.02 10*3/uL (ref 0.00–0.07)
Basophils Absolute: 0 10*3/uL (ref 0.0–0.1)
Basophils Relative: 0 %
Eosinophils Absolute: 0.1 10*3/uL (ref 0.0–0.5)
Eosinophils Relative: 1 %
HCT: 41.6 % (ref 36.0–46.0)
Hemoglobin: 13.2 g/dL (ref 12.0–15.0)
Immature Granulocytes: 0 %
Lymphocytes Relative: 27 %
Lymphs Abs: 2.1 10*3/uL (ref 0.7–4.0)
MCH: 27.7 pg (ref 26.0–34.0)
MCHC: 31.7 g/dL (ref 30.0–36.0)
MCV: 87.4 fL (ref 80.0–100.0)
Monocytes Absolute: 0.5 10*3/uL (ref 0.1–1.0)
Monocytes Relative: 7 %
Neutro Abs: 5 10*3/uL (ref 1.7–7.7)
Neutrophils Relative %: 65 %
Platelet Count: 260 10*3/uL (ref 150–400)
RBC: 4.76 MIL/uL (ref 3.87–5.11)
RDW: 13.4 % (ref 11.5–15.5)
WBC Count: 7.8 10*3/uL (ref 4.0–10.5)
nRBC: 0 % (ref 0.0–0.2)

## 2020-10-23 MED ORDER — HEPARIN SOD (PORK) LOCK FLUSH 100 UNIT/ML IV SOLN
INTRAVENOUS | Status: AC
  Filled 2020-10-23: qty 5

## 2020-10-23 MED ORDER — HEPARIN SOD (PORK) LOCK FLUSH 100 UNIT/ML IV SOLN
500.0000 [IU] | Freq: Once | INTRAVENOUS | Status: AC
  Administered 2020-10-23: 500 [IU] via INTRAVENOUS

## 2020-10-23 MED ORDER — SODIUM CHLORIDE 0.9% FLUSH
10.0000 mL | INTRAVENOUS | Status: DC | PRN
  Administered 2020-10-23: 10 mL via INTRAVENOUS
  Filled 2020-10-23: qty 10

## 2020-10-23 MED ORDER — HEPARIN SOD (PORK) LOCK FLUSH 100 UNIT/ML IV SOLN
500.0000 [IU] | Freq: Once | INTRAVENOUS | Status: DC | PRN
  Filled 2020-10-23: qty 5

## 2020-10-23 MED ORDER — IOHEXOL 300 MG/ML  SOLN
75.0000 mL | Freq: Once | INTRAMUSCULAR | Status: AC | PRN
  Administered 2020-10-23: 75 mL via INTRAVENOUS

## 2020-10-26 ENCOUNTER — Other Ambulatory Visit: Payer: Self-pay

## 2020-10-26 ENCOUNTER — Telehealth: Payer: Self-pay | Admitting: Internal Medicine

## 2020-10-26 ENCOUNTER — Other Ambulatory Visit: Payer: Self-pay | Admitting: Internal Medicine

## 2020-10-26 ENCOUNTER — Inpatient Hospital Stay (HOSPITAL_BASED_OUTPATIENT_CLINIC_OR_DEPARTMENT_OTHER): Payer: Medicare Other | Admitting: Internal Medicine

## 2020-10-26 ENCOUNTER — Encounter: Payer: Self-pay | Admitting: Internal Medicine

## 2020-10-26 VITALS — BP 113/69 | HR 60 | Temp 97.2°F | Resp 17 | Ht <= 58 in | Wt 126.1 lb

## 2020-10-26 DIAGNOSIS — C3431 Malignant neoplasm of lower lobe, right bronchus or lung: Secondary | ICD-10-CM | POA: Diagnosis not present

## 2020-10-26 DIAGNOSIS — I10 Essential (primary) hypertension: Secondary | ICD-10-CM

## 2020-10-26 DIAGNOSIS — Z5111 Encounter for antineoplastic chemotherapy: Secondary | ICD-10-CM | POA: Diagnosis not present

## 2020-10-26 DIAGNOSIS — Z923 Personal history of irradiation: Secondary | ICD-10-CM | POA: Diagnosis not present

## 2020-10-26 DIAGNOSIS — K0889 Other specified disorders of teeth and supporting structures: Secondary | ICD-10-CM | POA: Diagnosis not present

## 2020-10-26 DIAGNOSIS — Z79899 Other long term (current) drug therapy: Secondary | ICD-10-CM | POA: Diagnosis not present

## 2020-10-26 DIAGNOSIS — R059 Cough, unspecified: Secondary | ICD-10-CM | POA: Diagnosis not present

## 2020-10-26 MED ORDER — HYDROCOD POLST-CPM POLST ER 10-8 MG/5ML PO SUER: ORAL | 0 refills | Status: DC

## 2020-10-26 NOTE — Progress Notes (Signed)
#        [facility] Telephone:(336) [phone]   Fax:(336) [phone]  OFFICE PROGRESS NOTE  Golden Circle, FNP [address] [place] [address]  PRINCIPAL DIAGNOSIS: Local recurrence of non-small cell lung cancer, adenocarcinoma, initially diagnosed as stage IB (T2a N0 M0) in March of 2010.   PRIOR THERAPY:  1. Status post right lower lobectomy under the care of Dr. Cyndia Bent on 04/08/2009. 2. Status post palliative radiotherapy to the right lower lobe recurrent lung mass under the care of Dr. Lisbeth Renshaw, completed on June 08, 2011. 3. Systemic chemotherapy with carboplatin for AUC of 5 and Alimta 500 mg/M2 every 3 weeks. She is status post 3 cycles.  CURRENT THERAPY: Tarceva 150 mg by mouth daily, therapy beginning 07/24/2012. Status post approximately 97 months of therapy.   INTERVAL HISTORY: [name] 59 y.o. female returns to the clinic today for follow-up visit with online [place] interpreter.  The patient is feeling fine today with no concerning complaints except for the dry cough.  She denied having any rash.  She denied having any nausea, vomiting, diarrhea or constipation.  She has no chest pain, shortness of breath or hemoptysis.  She denied having any weight loss or night sweats.  She has no headache or visual changes.  She continues to tolerate her treatment with Tarceva fairly well.  The patient had repeat CT scan of the chest performed recently and she is here for evaluation and discussion of her scan results.  She also complains of toothache and she would like referral to dental medicine.  MEDICAL HISTORY: Past Medical History:  Diagnosis Date  . Drug-induced skin rash 02/27/2017  . Encounter for antineoplastic chemotherapy 01/19/2016  . History of radiation therapy 05/02/11 to 06/08/11   right lung  . Lung cancer ([facility])    right lower lobe adenocarcinoma    ALLERGIES:  is allergic to codeine and doxycycline.  MEDICATIONS:  Current Outpatient  Medications  Medication Sig Dispense Refill  . acetaminophen (TYLENOL) 500 MG tablet Take 500 mg every 6 (six) hours as needed by mouth.    Marland Kitchen albuterol (PROAIR HFA) 108 (90 Base) MCG/ACT inhaler Inhale 1-2 puffs into the lungs every 6 (six) hours as needed for wheezing or shortness of breath. 1 Inhaler 2  . chlorpheniramine-HYDROcodone (TUSSIONEX) 10-8 MG/5ML SUER TAKE 5 MLS BY MOUTH TWICE DAILY 280 mL 0  . clindamycin (CLEOCIN T) 1 % lotion Apply to the skin rash twice daily. 60 mL 0  . diltiazem (TIAZAC) 120 MG 24 hr capsule Take 120 mg by mouth daily.    Marland Kitchen doxycycline (VIBRA-TABS) 100 MG tablet Take 1 tablet (100 mg total) by mouth 2 (two) times daily. 14 tablet 0  . erlotinib (TARCEVA) 150 MG tablet Take 1 tablet (150 mg total) by mouth daily. Take on an empty stomach at least 1 hour before or 2 hours after food. 30 tablet 11  . lidocaine-prilocaine (EMLA) cream Apply 1 application topically as needed. 30 g 0  . methylPREDNISolone (MEDROL DOSEPAK) 4 MG TBPK tablet Use as instructed. 21 tablet 0   No current facility-administered medications for this visit.    SURGICAL HISTORY:  Past Surgical History:  Procedure Laterality Date  . LUNG LOBECTOMY  04/18/2009   RLL  . PORTACATH PLACEMENT  02/29/2012   Procedure: INSERTION PORT-A-CATH;  Surgeon: Nicanor Alcon, MD;  Location: [facility];  Service: Thoracic;  Laterality: Left;  PowerPort 8 F attachable in left internal jugular.  REVIEW OF SYSTEMS:  Constitutional: positive for fatigue Eyes: negative Ears, nose, mouth, throat, and face: positive for Toothache Respiratory: negative Cardiovascular: negative Gastrointestinal: negative Genitourinary:negative Integument/breast: negative Hematologic/lymphatic: negative Musculoskeletal:negative Neurological: negative Behavioral/Psych: negative Endocrine: negative Allergic/Immunologic: negative   PHYSICAL EXAMINATION: General appearance: alert, cooperative and no distress Head:  Normocephalic, without obvious abnormality, atraumatic Neck: no adenopathy, no JVD, supple, symmetrical, trachea midline and thyroid not enlarged, symmetric, no tenderness/mass/nodules Lymph nodes: Cervical, supraclavicular, and axillary nodes normal. Resp: clear to auscultation bilaterally Back: symmetric, no curvature. ROM normal. No CVA tenderness. Cardio: regular rate and rhythm, S1, S2 normal, no murmur, click, rub or gallop GI: soft, non-tender; bowel sounds normal; no masses,  no organomegaly Extremities: extremities normal, atraumatic, no cyanosis or edema Neurologic: Alert and oriented X 3, normal strength and tone. Normal symmetric reflexes. Normal coordination and gait  ECOG PERFORMANCE STATUS: 1 - Symptomatic but completely ambulatory  Blood pressure 113/69, pulse 60, temperature (!) 97.2 F (36.2 C), temperature source Tympanic, resp. rate 17, height 4\' 9"  (1.448 m), weight 126 lb 1.6 oz (57.2 kg), last menstrual period 02/10/2012, SpO2 100 %.  LABORATORY DATA: Lab Results  Component Value Date   WBC 7.8 10/23/2020   HGB 13.2 10/23/2020   HCT 41.6 10/23/2020   MCV 87.4 10/23/2020   PLT 260 10/23/2020      Chemistry      Component Value Date/Time   NA 141 10/23/2020 1228   NA 143 10/30/2017 1031   K 4.0 10/23/2020 1228   K 4.3 10/30/2017 1031   CL 103 10/23/2020 1228   CL 102 04/30/2013 1002   CO2 26 10/23/2020 1228   CO2 29 10/30/2017 1031   BUN 13 10/23/2020 1228   BUN 14.6 10/30/2017 1031   CREATININE 0.61 10/23/2020 1228   CREATININE 0.7 10/30/2017 1031      Component Value Date/Time   CALCIUM 9.2 10/23/2020 1228   CALCIUM 9.2 10/30/2017 1031   ALKPHOS 99 10/23/2020 1228   ALKPHOS 103 10/30/2017 1031   AST 26 10/23/2020 1228   AST 20 10/30/2017 1031   ALT 18 10/23/2020 1228   ALT 14 10/30/2017 1031   BILITOT 0.4 10/23/2020 1228   BILITOT 0.79 10/30/2017 1031       RADIOGRAPHIC STUDIES: CT Chest W Contrast  Result Date: 10/24/2020 CLINICAL  DATA:  Non-small cell lung cancer restaging. Radiation therapy complete. Ongoing oral chemotherapy. EXAM: CT CHEST WITH CONTRAST TECHNIQUE: Multidetector CT imaging of the chest was performed during intravenous contrast administration. CONTRAST:  80mL OMNIPAQUE IOHEXOL 300 MG/ML  SOLN COMPARISON:  05/07/2020 FINDINGS: Cardiovascular: Left Port-A-Cath tip: SVC. Atherosclerotic calcification of the aortic arch. Mediastinum/Nodes: No pathologic adenopathy identified. Lungs/Pleura: Right lower lobectomy. Consolidation posteriorly in the right lung with focal rounded pleural thickening in this vicinity measuring about 3.6 by 3.1 cm on image 67 of series 2, formerly 4.2 by 3.0 cm. Pleural thickening and likely pleural enhancement on the right side posteriorly, along with a small right pleural effusion which appears stable. Lymph node in the right eccentric pericardial adipose tissue 0.5 cm in short axis on image 83 of series 2, previously 0.6 cm. In the aerated portion of the right upper lobe there are perihilar nodular opacities with solid central component and peripheral ground-glass density component. The more posterior lesion measures 1.5 by 1.6 cm on image 50 of series 7, previously 1.7 by 1.5 cm by my measurements. The more anterior lesion measures 1.7 by 1.5 cm on image 55 of series 7, previously 1.6  by 1.4 cm. Bandlike density favoring scarring at the right lung apex, stable. Upper Abdomen: Unremarkable Musculoskeletal: Deformity from old healed left clavicular fracture. Mild lower thoracic spondylosis. IMPRESSION: 1. Essentially stable perihilar nodular opacities with solid central component and peripheral ground-glass density component in the right upper lobe. These lesions are concerning for malignancy and surveillance is recommended. 2. Right lower lobectomy. 3. Continued pleural thickening in the right lung, with rounded pleural thickening/nodularity minimally reduced from prior. 4. Stable bandlike density at  the right lung apex favoring scarring. 5. Aortic atherosclerosis. Aortic Atherosclerosis (ICD10-I70.0). Electronically Signed   By: Van Clines M.D.   On: 10/24/2020 13:42     ASSESSMENT AND PLAN:  This is a very pleasant 59 years old Asian female with recurrent non-small cell lung cancer, adenocarcinoma status post surgical resection followed by systemic chemotherapy with carboplatin and Alimta followed by disease progression and the patient was started on treatment with Tarceva 150 mg by mouth daily status post 97 months  The patient continues to tolerate this treatment well with no concerning adverse effects. She had repeat CT scan of the chest performed recently.  I personally and independently reviewed the scan images and discussed the results with the patient today. Her scan showed no concerning findings for disease progression but she will need close monitoring of the right pleural thickening and perihilar nodular opacity with solid central component. I recommended for the patient to continue her current treatment with Tarceva with the same dose. For the toothache, I will refer her to the dental medicine at [facility]. The patient will come back for follow-up visit in 6 weeks for evaluation and repeat blood work before traveling to [place]. For the dry cough I will give her a refill of Tussionex. The patient was advised to call immediately if she has any concerning symptoms in the interval. All questions were answered. The patient knows to call the clinic with any problems, questions or concerns. We can certainly see the patient much sooner if necessary.  Disclaimer: This note was dictated with voice recognition software. Similar sounding words can inadvertently be transcribed and may not be corrected upon review.

## 2020-10-26 NOTE — Telephone Encounter (Signed)
Scheduled appointment per 11/29 los. Spoke to patient who is aware of appointments date and times. Gave patient calendar print out.

## 2020-11-06 MED FILL — HYDROCODONE-CHLORPHEN ER SU: 10-8 | 28 days supply | Qty: 280 | Fill #0

## 2020-11-09 ENCOUNTER — Ambulatory Visit (HOSPITAL_COMMUNITY): Payer: Self-pay | Admitting: Dentistry

## 2020-11-09 ENCOUNTER — Other Ambulatory Visit: Payer: Self-pay

## 2020-11-09 ENCOUNTER — Encounter (HOSPITAL_COMMUNITY): Payer: Self-pay | Admitting: Dentistry

## 2020-11-09 DIAGNOSIS — Z9221 Personal history of antineoplastic chemotherapy: Secondary | ICD-10-CM | POA: Diagnosis not present

## 2020-11-09 DIAGNOSIS — Z923 Personal history of irradiation: Secondary | ICD-10-CM | POA: Diagnosis not present

## 2020-11-09 DIAGNOSIS — C3490 Malignant neoplasm of unspecified part of unspecified bronchus or lung: Secondary | ICD-10-CM | POA: Diagnosis not present

## 2020-11-09 NOTE — Patient Instructions (Signed)
[facility] of Dental Medicine Dr. Debe Coder B. Craig Wisnewski Phone: [phone] Fax: [phone]    It was a pleasure seeing you today!     I am going to refer you to an Endodontist, who is a root canal specialist, for a consultation about your tooth.  After that appointment, if you decide you want to have the tooth taken out instead of having a root canal, I will refer you to a doctor who will take out your tooth while you are asleep.   It is very important you find an outside dentist of your choice to see you regularly for cleanings and exams.  You do have several other cavities on other teeth that need to be addressed before they get too big like your tooth that is bothering you now.   Please call our clinic should you have any questions or concerns that may come up after you leave today.    Thank you for giving [place] the opportunity to provide care for you.  If there is anything we can do for you, please let us know.

## 2020-11-09 NOTE — Progress Notes (Signed)
DENTAL VISIT LIMITED EXAM  Service Date:   11/09/2020 Referring Provider:                  Dr. Julien Nordmann, MD  Patient Name:   [name] Date of Birth:   [birth year] Medical Record Number: [id number]    PLAN & RECOMMENDATIONS   >> The patient does have a lower left premolar with a large cavity.  Due to the size of the cavity, the tooth needs either a root canal + crown, or an extraction.    >> Discussed in detail all treatment options with the patient, and she would like to be referred to an Endodontist for a consult first to see if she can save the tooth.  After that appointment, if she decides instead to have the tooth pulled, I will refer her to an oral surgeon for the extraction under sedation.  >> Recommend the patient establish care at a dental office of her choice for routine dental care including restorations, replacement of missing teeth, cleanings and exams.   Thank you for consulting with [facility] and for the opportunity to participate in this patient's treatment.  Should you have any questions or concerns, please contact the [facility] at [phone].    Progress Note:  11/09/2020   COVID 19 SCREENING: The patient denies symptoms concerning for COVID-19 infection including fever, chills, cough, or newly developed shortness of breath.   HPI: [name] is a very pleasant 59 y.o. female with h/o recurrent right lower lobe non-small cell lung cancer currently on oral antineoplastic chemotherapy Tarceva, radiation therapy, atrial arrhythmia, HTN and GERD who presents today with a [place] interpreter and her niece for a Limited Exam regarding a toothache. Dental History: The patient was previously seen in the [facility] by Dr. Enrique Sack back in 2015 for an Outpatient Consultation to rule out dental infection that may affect the patient's systemic health while on chemotherapy.  The patient refused recommended treatment options at that time which included  extractions of 3rd molars by an oral surgeon.  Since then, the patient has not seen a dentist and she does not have a regular provider.  The patient reports having a toothache in the lower left quadrant that has been going on for a while.  She states that it only really bothers her when she chews or eats something, putting pressure on it.  She has had to take Tylenol for the pain occasionally.  She denies any other dental/oral pain or sensitivity at this time. Patient able to manage oral secretions.  Patient denies dysphagia, odynophagia, dysphonia, SOB and neck pain.  Patient denies fever, rigors and malaise.   CHIEF COMPLAINT: "My tooth hurts when I chew," points to tooth #21.   Patient Active Problem List   Diagnosis Date Noted  . Otitis media 03/13/2018  . Seasonal allergic rhinitis due to pollen 06/15/2017  . Essential hypertension 05/23/2017  . Dyspnea 05/21/2017  . Acute upper respiratory infection 05/21/2017  . Atrial arrhythmia 05/21/2017  . Drug-induced skin rash 02/27/2017  . Port catheter in place 12/27/2016  . Encounter for antineoplastic chemotherapy 06/13/2016  . GERD (gastroesophageal reflux disease) 04/18/2016  . Primary cancer of right lower lobe of lung ([place]) 05/06/2009  . LEIOMYOMA, UTERUS 05/06/2009   Past Medical History:  Diagnosis Date  . Drug-induced skin rash 02/27/2017  . Encounter for antineoplastic chemotherapy 01/19/2016  . History of radiation therapy 05/02/11 to 06/08/11   right lung  . Lung cancer ([place])  right lower lobe adenocarcinoma   Past Surgical History:  Procedure Laterality Date  . LUNG LOBECTOMY  04/18/2009   RLL  . PORTACATH PLACEMENT  02/29/2012   Procedure: INSERTION PORT-A-CATH;  Surgeon: Nicanor Alcon, MD;  Location: [facility];  Service: Thoracic;  Laterality: Left;  PowerPort 8 F attachable in left internal jugular.   Allergies  Allergen Reactions  . Codeine Nausea Only  . Doxycycline Nausea And Vomiting    vomiting and headache    Current Outpatient Medications  Medication Sig Dispense Refill  . acetaminophen (TYLENOL) 500 MG tablet Take 500 mg every 6 (six) hours as needed by mouth.    Marland Kitchen albuterol (PROAIR HFA) 108 (90 Base) MCG/ACT inhaler Inhale 1-2 puffs into the lungs every 6 (six) hours as needed for wheezing or shortness of breath. 1 Inhaler 2  . chlorpheniramine-HYDROcodone (TUSSIONEX) 10-8 MG/5ML SUER TAKE 5 MLS BY MOUTH TWICE DAILY 280 mL 0  . clindamycin (CLEOCIN T) 1 % lotion Apply to the skin rash twice daily. 60 mL 0  . diltiazem (TIAZAC) 120 MG 24 hr capsule Take 120 mg by mouth daily.    Marland Kitchen erlotinib (TARCEVA) 150 MG tablet Take 1 tablet (150 mg total) by mouth daily. Take on an empty stomach at least 1 hour before or 2 hours after food. 30 tablet 11  . lidocaine-prilocaine (EMLA) cream Apply 1 application topically as needed. 30 g 0   No current facility-administered medications for this visit.    LABS: Lab Results  Component Value Date   WBC 7.8 10/23/2020   HGB 13.2 10/23/2020   HCT 41.6 10/23/2020   MCV 87.4 10/23/2020   PLT 260 10/23/2020      Component Value Date/Time   NA 141 10/23/2020 1228   NA 143 10/30/2017 1031   K 4.0 10/23/2020 1228   K 4.3 10/30/2017 1031   CL 103 10/23/2020 1228   CL 102 04/30/2013 1002   CO2 26 10/23/2020 1228   CO2 29 10/30/2017 1031   GLUCOSE 99 10/23/2020 1228   GLUCOSE 89 10/30/2017 1031   GLUCOSE 95 04/30/2013 1002   BUN 13 10/23/2020 1228   BUN 14.6 10/30/2017 1031   CREATININE 0.61 10/23/2020 1228   CREATININE 0.7 10/30/2017 1031   CALCIUM 9.2 10/23/2020 1228   CALCIUM 9.2 10/30/2017 1031   GFRNONAA >60 10/23/2020 1228   GFRAA >60 08/24/2020 1142   Lab Results  Component Value Date   INR 0.88 02/29/2012   INR 0.87 04/08/2011   INR 0.87 03/09/2011   No results found for: PTT  Social History   Socioeconomic History  . Marital status: Married    Spouse name: Not on file  . Number of children: 7  . Years of education: Not on file   . Highest education level: Not on file  Occupational History  . Not on file  Tobacco Use  . Smoking status: Never Smoker  . Smokeless tobacco: Never Used  Vaping Use  . Vaping Use: Never used  Substance and Sexual Activity  . Alcohol use: No  . Drug use: No  . Sexual activity: Yes  Other Topics Concern  . Not on file  Social History Narrative   09/01/2014   The patient is a 59 year old [place] woman.   The patient is married and has 7 children. One child passed away.   The patient came to the [place] in approximately 1990. They were in [place] for 10-11 years and then moved to [place], [place]  since then.   Patient does not smoke, drink, or use illicit drugs.   Patient has not used smokeless tobacco products.   Fun/Hobby: Go shopping    Social Determinants of Health   Financial Resource Strain: Not on file  Food Insecurity: Not on file  Transportation Needs: Not on file  Physical Activity: Not on file  Stress: Not on file  Social Connections: Not on file  Intimate Partner Violence: Not on file   Family History  Problem Relation Age of Onset  . Heart Problems Mother   . Cancer Neg Hx      REVIEW OF SYSTEMS: Reviewed with the patient and interpreter as per HPI. PSYCH: (++) Dental Phobia   VITAL SIGNS: BP 125/70 (BP Location: Right Arm)   Pulse 62   Temp 98.5 F (36.9 C)   LMP 02/10/2012    PHYSICAL EXAMINATION: GENERAL: Well-developed, comfortable and in no apparent distress. NEUROLOGICAL: Alert and oriented to person, place and time. EXTRAORAL:  Facial symmetry present without any edema or erythema.  No swelling or lymphadenopathy. INTRAORAL: Soft tissues appear well-perfused and mucous membranes moist.  FOM and vestibules soft and not raised. Oral cavity without mass or lesion. No signs of infection, parulis, sinus tract, edema or erythema evident upon exam.  Small bilateral mandibular tori.  DENTAL EXAMINATION: DENTITION: Overall fair  remaining dentition.  Missing tooth #18, Moderate generalized attrition on occlusal surfaces of posterior teeth. The patient is maintaining fair oral hygiene. PERIODONTAL: Pink, healthy gingival tissue with blunted papilla.  Generalized gingival recession.  Probing Depths recorded for tooth #21: MB: 3 mm, B: 1 mm, DB: 2 mm, DL: 3 mm, L: 2 mm, ML: 3 mm DENTAL CARIES: #17 occlusal caries, #21 Buccal(V) deep decay extending subgingivally  ENDODONTIC: Teeth #20, #21 and #22 tested for palpation, percussion, air/water, mobility and caries.  Clinically significant results included (+) caries and (++) percussion.  OCCLUSION: Class I molar occlusion. Bite appears balanced and stable.  RADIOGRAPHIC EXAMINATION PAN and PA exposed and interpreted: Condyles seated bilaterally in fossas.  No evidence of abnormal pathology.  All visualized osseous structures appear WNL. Generalized mild horizontal bone loss consistent with mild periodontitis. Missing tooth #18, caries, URQ supernumerary tooth distal to #2, #17 mesially drifted, #1 is impacted. #2D caries, #15D caries, #21 deep decay approximating the pulp with no signs of periapical radiolucency.   ASSESSMENT 1. CA Lung 2. Atrial Arrhythmia 3. HTN 4. GERD 5. Missing teeth 6. Supernumerary teeth 7. Caries 8. Attrition 9. #21 symptomatic irreversible pulpitis with acute apical periodontitis 10. Gingivitis (tentative periodontal dx) 11. Gingival recession 12. Mandibular tori 13. Impacted tooth   PLAN/RECOMMENDATIONS  . I discussed the risks, benefits, and complications of various treatment options with the patient and interpreter in relationship to her medical and dental conditions. I explained to the patient that although her tooth is not yet infected to the point of causing swelling or abscess, the cavity is too deep to save the tooth with a simple filling based on her reported symptoms and clinical exam findings.  Explained that the tooth would  either need a root canal + full coverage crown to save it, or it would need to be taken out to decrease her risk of worsening pain and infection.  I also explained that if she is able to have a root canal on the tooth after seeing an Endodontist, she may need an additional procedure to remove some bone surrounding the tooth in order to remove all of the cavity  and to make sure she is able to have a crown placed on the tooth afterwards.  I explained that this is because the cavity has extended underneath her gums and appears to be below her bone level on her X-rays. . We discussed various treatment options to include No Treatment, Extraction of tooth #21, and #21 Root Canal Therapy + Crown. . The patient verbalized understanding of all options, and currently wishes to proceed with referral to Endodontist for a Consultation to discuss saving the tooth and what that would entail/what they would recommend as far as prognosis.  She understands that if the tooth is not restorable after seeing the Endodontist, or if she decides not to go through with the root canal, she will need to have it extracted.  I told her that if this end sup being the case, I would refer her to an oral surgeon in [place] so she can have the tooth out while she is sedated.   . She verbalized understanding and is agreeable to this plan.    It was further discussed with the patient that I strongly encourage her to find an outside dental provider of her choice for comprehensive dental care.  I explained that she has several other cavities on other teeth that will become infected and start causing pain like her tooth now if they are not addressed, and it is important to go regularly to the dentist for routine exams and cleanings to make sure her oral health is optimized.  . Discussion of findings with medical team and coordination of future medical and dental care as needed.     The patient tolerated today's visit well.  All  questions/concerns were addressed using the interpreter, and the patient departed in stable condition.   I spent in excess of 60 minutes during the conduct of this consultation and >50% of this time involved direct face-to-face encounter for counseling and/or coordination of the patient's care.   [place], DMD

## 2020-12-07 ENCOUNTER — Other Ambulatory Visit: Payer: Self-pay | Admitting: Internal Medicine

## 2020-12-07 ENCOUNTER — Telehealth: Payer: Self-pay | Admitting: Internal Medicine

## 2020-12-07 ENCOUNTER — Other Ambulatory Visit: Payer: Self-pay

## 2020-12-07 ENCOUNTER — Encounter: Payer: Self-pay | Admitting: Internal Medicine

## 2020-12-07 ENCOUNTER — Encounter: Payer: Self-pay | Admitting: Medical Oncology

## 2020-12-07 ENCOUNTER — Inpatient Hospital Stay: Payer: Medicare Other

## 2020-12-07 ENCOUNTER — Other Ambulatory Visit: Payer: Medicare Other

## 2020-12-07 ENCOUNTER — Inpatient Hospital Stay: Payer: Medicare Other | Attending: Internal Medicine | Admitting: Internal Medicine

## 2020-12-07 VITALS — BP 140/92 | HR 102 | Temp 97.9°F | Resp 16 | Ht <= 58 in | Wt 124.0 lb

## 2020-12-07 DIAGNOSIS — Z923 Personal history of irradiation: Secondary | ICD-10-CM | POA: Diagnosis not present

## 2020-12-07 DIAGNOSIS — Z95828 Presence of other vascular implants and grafts: Secondary | ICD-10-CM

## 2020-12-07 DIAGNOSIS — R21 Rash and other nonspecific skin eruption: Secondary | ICD-10-CM | POA: Insufficient documentation

## 2020-12-07 DIAGNOSIS — Z79899 Other long term (current) drug therapy: Secondary | ICD-10-CM | POA: Insufficient documentation

## 2020-12-07 DIAGNOSIS — C3490 Malignant neoplasm of unspecified part of unspecified bronchus or lung: Secondary | ICD-10-CM

## 2020-12-07 DIAGNOSIS — C3431 Malignant neoplasm of lower lobe, right bronchus or lung: Secondary | ICD-10-CM | POA: Insufficient documentation

## 2020-12-07 DIAGNOSIS — Z5111 Encounter for antineoplastic chemotherapy: Secondary | ICD-10-CM | POA: Diagnosis not present

## 2020-12-07 DIAGNOSIS — L270 Generalized skin eruption due to drugs and medicaments taken internally: Secondary | ICD-10-CM

## 2020-12-07 LAB — CMP (CANCER CENTER ONLY)
ALT: 20 U/L (ref 0–44)
AST: 28 U/L (ref 15–41)
Albumin: 3.9 g/dL (ref 3.5–5.0)
Alkaline Phosphatase: 85 U/L (ref 38–126)
Anion gap: 10 (ref 5–15)
BUN: 8 mg/dL (ref 6–20)
CO2: 26 mmol/L (ref 22–32)
Calcium: 9 mg/dL (ref 8.9–10.3)
Chloride: 104 mmol/L (ref 98–111)
Creatinine: 0.69 mg/dL (ref 0.44–1.00)
GFR, Estimated: >60 mL/min (ref >60)
Glucose, Bld: 128 mg/dL — ABNORMAL HIGH (ref 70–99)
Potassium: 3.9 mmol/L (ref 3.5–5.1)
Sodium: 140 mmol/L (ref 135–145)
Total Bilirubin: 0.9 mg/dL (ref 0.3–1.2)
Total Protein: 8.1 g/dL (ref 6.5–8.1)

## 2020-12-07 LAB — CBC WITH DIFFERENTIAL (CANCER CENTER ONLY)
Abs Immature Granulocytes: 0.01 10*3/uL (ref 0.00–0.07)
Basophils Absolute: 0 10*3/uL (ref 0.0–0.1)
Basophils Relative: 1 %
Eosinophils Absolute: 0.1 10*3/uL (ref 0.0–0.5)
Eosinophils Relative: 1 %
HCT: 45 % (ref 36.0–46.0)
Hemoglobin: 14 g/dL (ref 12.0–15.0)
Immature Granulocytes: 0 %
Lymphocytes Relative: 21 %
Lymphs Abs: 1.5 10*3/uL (ref 0.7–4.0)
MCH: 27.3 pg (ref 26.0–34.0)
MCHC: 31.1 g/dL (ref 30.0–36.0)
MCV: 87.7 fL (ref 80.0–100.0)
Monocytes Absolute: 0.4 10*3/uL (ref 0.1–1.0)
Monocytes Relative: 6 %
Neutro Abs: 5.3 10*3/uL (ref 1.7–7.7)
Neutrophils Relative %: 71 %
Platelet Count: 311 10*3/uL (ref 150–400)
RBC: 5.13 MIL/uL — ABNORMAL HIGH (ref 3.87–5.11)
RDW: 13.8 % (ref 11.5–15.5)
WBC Count: 7.3 10*3/uL (ref 4.0–10.5)
nRBC: 0 % (ref 0.0–0.2)

## 2020-12-07 MED ORDER — CLINDAMYCIN PHOSPHATE 1 % EX LOTN: TOPICAL_LOTION | CUTANEOUS | 0 refills | Status: DC

## 2020-12-07 MED ORDER — HYDROCOD POLST-CPM POLST ER 10-8 MG/5ML PO SUER: ORAL | 0 refills | Status: DC

## 2020-12-07 MED ORDER — HEPARIN SOD (PORK) LOCK FLUSH 100 UNIT/ML IV SOLN
500.0000 [IU] | Freq: Once | INTRAVENOUS | Status: AC | PRN
  Administered 2020-12-07: 500 [IU] via INTRAVENOUS
  Filled 2020-12-07: qty 5

## 2020-12-07 MED ORDER — SODIUM CHLORIDE 0.9% FLUSH
10.0000 mL | INTRAVENOUS | Status: DC | PRN
  Administered 2020-12-07: 10 mL via INTRAVENOUS
  Filled 2020-12-07: qty 10

## 2020-12-07 MED FILL — CLINDAMYCIN PHOSPHATE 1 % L: 1 | 30 days supply | Qty: 60 | Fill #0

## 2020-12-07 MED FILL — HYDROCODONE-CHLORPHEN ER SU: 10-8 | 28 days supply | Qty: 280 | Fill #0

## 2020-12-07 NOTE — Progress Notes (Signed)
#        [name] [phone]   [phone]  OFFICE PROGRESS NOTE  [place], FNP [address] [place] [address]  PRINCIPAL DIAGNOSIS: Local recurrence of non-small cell lung cancer, adenocarcinoma, initially diagnosed as stage IB (T2a N0 M0) in March of 2010.   PRIOR THERAPY:  1. Status post right lower lobectomy under the care of Dr. Cyndia Bent on 04/08/2009. 2. Status post palliative radiotherapy to the right lower lobe recurrent lung mass under the care of Dr. Lisbeth Renshaw, completed on June 08, 2011. 3. Systemic chemotherapy with carboplatin for AUC of 5 and Alimta 500 mg/M2 every 3 weeks. She is status post 3 cycles.  CURRENT THERAPY: Tarceva 150 mg by mouth daily, therapy beginning 07/24/2012. Status post approximately 100 months of therapy.   INTERVAL HISTORY: [name] 60 y.o. female returns to the clinic today for follow-up visit accompanied by her interpreter.  The patient is feeling fine today with no concerning complaints except for the persistent cough.  She denied having any chest pain, shortness of breath or hemoptysis.  She denied having any recent weight loss or night sweats.  She has no nausea, vomiting, diarrhea or constipation.  She has no headache or visual changes.  She continues to tolerate her treatment with Tagrisso fairly well.  The patient is here today for evaluation and repeat blood work.   MEDICAL HISTORY: Past Medical History:  Diagnosis Date  . Drug-induced skin rash 02/27/2017  . Encounter for antineoplastic chemotherapy 01/19/2016  . History of radiation therapy 05/02/11 to 06/08/11   right lung  . Lung cancer ([place])    right lower lobe adenocarcinoma    ALLERGIES:  is allergic to codeine and doxycycline.  MEDICATIONS:  Current Outpatient Medications  Medication Sig Dispense Refill  . acetaminophen (TYLENOL) 500 MG tablet Take 500 mg every 6 (six) hours as needed by mouth.    Marland Kitchen albuterol (PROAIR HFA)  108 (90 Base) MCG/ACT inhaler Inhale 1-2 puffs into the lungs every 6 (six) hours as needed for wheezing or shortness of breath. 1 Inhaler 2  . chlorpheniramine-HYDROcodone (TUSSIONEX) 10-8 MG/5ML SUER TAKE 5 MLS BY MOUTH TWICE DAILY 280 mL 0  . clindamycin (CLEOCIN T) 1 % lotion Apply to the skin rash twice daily. 60 mL 0  . diltiazem (TIAZAC) 120 MG 24 hr capsule Take 120 mg by mouth daily.    Marland Kitchen erlotinib (TARCEVA) 150 MG tablet Take 1 tablet (150 mg total) by mouth daily. Take on an empty stomach at least 1 hour before or 2 hours after food. 30 tablet 11  . lidocaine-prilocaine (EMLA) cream Apply 1 application topically as needed. 30 g 0   No current facility-administered medications for this visit.    SURGICAL HISTORY:  Past Surgical History:  Procedure Laterality Date  . LUNG LOBECTOMY  04/18/2009   RLL  . PORTACATH PLACEMENT  02/29/2012   Procedure: INSERTION PORT-A-CATH;  Surgeon: Nicanor Alcon, MD;  Location: [facility];  Service: Thoracic;  Laterality: Left;  PowerPort 8 F attachable in left internal jugular.    REVIEW OF SYSTEMS:  A comprehensive review of systems was negative except for: Respiratory: positive for cough   PHYSICAL EXAMINATION: General appearance: alert, cooperative and no distress Head: Normocephalic, without obvious abnormality, atraumatic Neck: no adenopathy, no JVD, supple, symmetrical, trachea midline and thyroid not enlarged, symmetric, no tenderness/mass/nodules Lymph nodes: Cervical, supraclavicular, and axillary nodes normal. Resp: clear to auscultation bilaterally Back: symmetric,  no curvature. ROM normal. No CVA tenderness. Cardio: regular rate and rhythm, S1, S2 normal, no murmur, click, rub or gallop GI: soft, non-tender; bowel sounds normal; no masses,  no organomegaly Extremities: extremities normal, atraumatic, no cyanosis or edema  ECOG PERFORMANCE STATUS: 1 - Symptomatic but completely ambulatory  Blood pressure (!) 140/92, pulse (!) 102,  temperature 97.9 F (36.6 C), temperature source Tympanic, resp. rate 16, height 4\' 9"  (1.448 m), weight 124 lb (56.2 kg), last menstrual period 02/10/2012, SpO2 98 %.  LABORATORY DATA: Lab Results  Component Value Date   WBC 7.3 12/07/2020   HGB 14.0 12/07/2020   HCT 45.0 12/07/2020   MCV 87.7 12/07/2020   PLT 311 12/07/2020      Chemistry      Component Value Date/Time   NA 141 10/23/2020 1228   NA 143 10/30/2017 1031   K 4.0 10/23/2020 1228   K 4.3 10/30/2017 1031   CL 103 10/23/2020 1228   CL 102 04/30/2013 1002   CO2 26 10/23/2020 1228   CO2 29 10/30/2017 1031   BUN 13 10/23/2020 1228   BUN 14.6 10/30/2017 1031   CREATININE 0.61 10/23/2020 1228   CREATININE 0.7 10/30/2017 1031      Component Value Date/Time   CALCIUM 9.2 10/23/2020 1228   CALCIUM 9.2 10/30/2017 1031   ALKPHOS 99 10/23/2020 1228   ALKPHOS 103 10/30/2017 1031   AST 26 10/23/2020 1228   AST 20 10/30/2017 1031   ALT 18 10/23/2020 1228   ALT 14 10/30/2017 1031   BILITOT 0.4 10/23/2020 1228   BILITOT 0.79 10/30/2017 1031       RADIOGRAPHIC STUDIES: No results found.   ASSESSMENT AND PLAN:  This is a very pleasant 60 years old Asian female with recurrent non-small cell lung cancer, adenocarcinoma status post surgical resection followed by systemic chemotherapy with carboplatin and Alimta followed by disease progression and the patient was started on treatment with Tarceva 150 mg by mouth daily status post 100 months  The patient continues to tolerate this treatment well with no concerning adverse effects except for mild skin rash.  She also has cough related to her disease. For the cough I gave her a refill of Tussionex. For the skin rash I gave her a refill of clindamycin lotion. The patient is traveling to [place] next months. I will arrange for her to come back for follow-up visit in around 10 weeks with repeat CT scan of the chest for restaging of her disease. The patient was advised to call  immediately if she has any other concerning symptoms in the interval. All questions were answered. The patient knows to call the clinic with any problems, questions or concerns. We can certainly see the patient much sooner if necessary.  Disclaimer: This note was dictated with voice recognition software. Similar sounding words can inadvertently be transcribed and may not be corrected upon review.

## 2020-12-07 NOTE — Telephone Encounter (Signed)
Scheduled appointments per 1/10 los. Spoke to patient who is aware of appointments dates and times.
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
## 2020-12-10 ENCOUNTER — Encounter: Payer: Self-pay | Admitting: Physician Assistant

## 2020-12-10 ENCOUNTER — Ambulatory Visit (INDEPENDENT_AMBULATORY_CARE_PROVIDER_SITE_OTHER): Payer: Medicare Other | Admitting: Physician Assistant

## 2020-12-10 ENCOUNTER — Other Ambulatory Visit: Payer: Self-pay

## 2020-12-10 VITALS — BP 108/76 | HR 104 | Ht <= 58 in | Wt 124.0 lb

## 2020-12-10 DIAGNOSIS — I491 Atrial premature depolarization: Secondary | ICD-10-CM

## 2020-12-10 DIAGNOSIS — I471 Supraventricular tachycardia: Secondary | ICD-10-CM | POA: Diagnosis not present

## 2020-12-10 NOTE — Patient Instructions (Addendum)
Medication Instructions:    Your physician recommends that you continue on your current medications as directed. Please refer to the Current Medication list given to you today.  *If you need a refill on your cardiac medications before your next appointment, please call your pharmacy*   Lab Work: [place]  If you have labs (blood work) drawn today and your tests are completely normal, you will receive your results only by: Marland Kitchen MyChart Message (if you have MyChart) OR . A paper copy in the mail If you have any lab test that is abnormal or we need to change your treatment, we will call you to review the results.   Testing/Procedures: NONE ORDERED  TODAY    Follow-Up: At [facility], you and your health needs are our priority.  As part of our continuing mission to provide you with exceptional heart care, we have created designated Provider Care Teams.  These Care Teams include your primary Cardiologist (physician) and Advanced Practice Providers (APPs -  Physician Assistants and Nurse Practitioners) who all work together to provide you with the care you need, when you need it.  We recommend signing up for the patient portal called "MyChart".  Sign up information is provided on this After Visit Summary.  MyChart is used to connect with patients for Virtual Visits (Telemedicine).  Patients are able to view lab/test results, encounter notes, upcoming appointments, etc.  Non-urgent messages can be sent to your provider as well.   To learn more about what you can do with MyChart, go to [email or web address].    Your next appointment:    6 month(s)  The format for your next appointment:   In Person  Provider:   You may see Dr. Lovena Le or one of the following Advanced Practice Providers on your designated Care Team:    Chanetta Marshall, NP  Tommye Standard, PA-C  Legrand Como "Oda Kilts, [place]    Other Instructions

## 2020-12-21 ENCOUNTER — Other Ambulatory Visit: Payer: Self-pay | Admitting: Medical Oncology

## 2020-12-21 DIAGNOSIS — C3491 Malignant neoplasm of unspecified part of right bronchus or lung: Secondary | ICD-10-CM

## 2020-12-21 DIAGNOSIS — Z95828 Presence of other vascular implants and grafts: Secondary | ICD-10-CM

## 2020-12-21 DIAGNOSIS — C3431 Malignant neoplasm of lower lobe, right bronchus or lung: Secondary | ICD-10-CM

## 2020-12-21 MED ORDER — ERLOTINIB HCL 150 MG PO TABS: 150.0000 mg | ORAL_TABLET | Freq: Every day | ORAL | 11 refills | Status: DC

## 2021-01-12 DIAGNOSIS — Z20822 Contact with and (suspected) exposure to covid-19: Secondary | ICD-10-CM | POA: Diagnosis not present

## 2021-02-15 ENCOUNTER — Other Ambulatory Visit: Payer: Self-pay

## 2021-02-15 ENCOUNTER — Inpatient Hospital Stay: Payer: Medicare Other | Attending: Internal Medicine

## 2021-02-15 ENCOUNTER — Ambulatory Visit (HOSPITAL_COMMUNITY)
Admission: RE | Admit: 2021-02-15 | Discharge: 2021-02-15 | Disposition: A | Discharge status: Home / Self Care | Payer: Medicare Other | Source: Ambulatory Visit | Attending: Internal Medicine | Admitting: Internal Medicine

## 2021-02-15 ENCOUNTER — Other Ambulatory Visit: Payer: Medicare Other

## 2021-02-15 ENCOUNTER — Inpatient Hospital Stay: Payer: Medicare Other

## 2021-02-15 DIAGNOSIS — Z79899 Other long term (current) drug therapy: Secondary | ICD-10-CM | POA: Insufficient documentation

## 2021-02-15 DIAGNOSIS — J929 Pleural plaque without asbestos: Secondary | ICD-10-CM | POA: Diagnosis not present

## 2021-02-15 DIAGNOSIS — R21 Rash and other nonspecific skin eruption: Secondary | ICD-10-CM | POA: Insufficient documentation

## 2021-02-15 DIAGNOSIS — R918 Other nonspecific abnormal finding of lung field: Secondary | ICD-10-CM | POA: Diagnosis not present

## 2021-02-15 DIAGNOSIS — C3490 Malignant neoplasm of unspecified part of unspecified bronchus or lung: Secondary | ICD-10-CM

## 2021-02-15 DIAGNOSIS — Z95828 Presence of other vascular implants and grafts: Secondary | ICD-10-CM

## 2021-02-15 DIAGNOSIS — C3431 Malignant neoplasm of lower lobe, right bronchus or lung: Secondary | ICD-10-CM | POA: Insufficient documentation

## 2021-02-15 DIAGNOSIS — J90 Pleural effusion, not elsewhere classified: Secondary | ICD-10-CM | POA: Diagnosis not present

## 2021-02-15 LAB — CMP (CANCER CENTER ONLY)
ALT: 32 U/L (ref 0–44)
AST: 38 U/L (ref 15–41)
Albumin: 3.9 g/dL (ref 3.5–5.0)
Alkaline Phosphatase: 97 U/L (ref 38–126)
Anion gap: 7 (ref 5–15)
BUN: 8 mg/dL (ref 6–20)
CO2: 28 mmol/L (ref 22–32)
Calcium: 8.5 mg/dL — ABNORMAL LOW (ref 8.9–10.3)
Chloride: 103 mmol/L (ref 98–111)
Creatinine: 0.64 mg/dL (ref 0.44–1.00)
GFR, Estimated: >60 mL/min (ref >60)
Glucose, Bld: 131 mg/dL — ABNORMAL HIGH (ref 70–99)
Potassium: 3.9 mmol/L (ref 3.5–5.1)
Sodium: 138 mmol/L (ref 135–145)
Total Bilirubin: 0.8 mg/dL (ref 0.3–1.2)
Total Protein: 7.7 g/dL (ref 6.5–8.1)

## 2021-02-15 LAB — CBC WITH DIFFERENTIAL (CANCER CENTER ONLY)
Abs Immature Granulocytes: 0.05 10*3/uL (ref 0.00–0.07)
Basophils Absolute: 0 10*3/uL (ref 0.0–0.1)
Basophils Relative: 0 %
Eosinophils Absolute: 0.1 10*3/uL (ref 0.0–0.5)
Eosinophils Relative: 0 %
HCT: 41.4 % (ref 36.0–46.0)
Hemoglobin: 13.1 g/dL (ref 12.0–15.0)
Immature Granulocytes: 0 %
Lymphocytes Relative: 5 %
Lymphs Abs: 0.7 10*3/uL (ref 0.7–4.0)
MCH: 27.4 pg (ref 26.0–34.0)
MCHC: 31.6 g/dL (ref 30.0–36.0)
MCV: 86.6 fL (ref 80.0–100.0)
Monocytes Absolute: 0.5 10*3/uL (ref 0.1–1.0)
Monocytes Relative: 4 %
Neutro Abs: 11.6 10*3/uL — ABNORMAL HIGH (ref 1.7–7.7)
Neutrophils Relative %: 91 %
Platelet Count: 251 10*3/uL (ref 150–400)
RBC: 4.78 MIL/uL (ref 3.87–5.11)
RDW: 14.3 % (ref 11.5–15.5)
WBC Count: 12.9 10*3/uL — ABNORMAL HIGH (ref 4.0–10.5)
nRBC: 0 % (ref 0.0–0.2)

## 2021-02-15 MED ORDER — SODIUM CHLORIDE 0.9% FLUSH
10.0000 mL | INTRAVENOUS | Status: DC | PRN
  Administered 2021-02-15: 10 mL via INTRAVENOUS
  Filled 2021-02-15: qty 10

## 2021-02-15 MED ORDER — IOHEXOL 300 MG/ML  SOLN
75.0000 mL | Freq: Once | INTRAMUSCULAR | Status: AC | PRN
  Administered 2021-02-15: 75 mL via INTRAVENOUS

## 2021-02-15 MED ORDER — HEPARIN SOD (PORK) LOCK FLUSH 100 UNIT/ML IV SOLN
INTRAVENOUS | Status: AC
  Administered 2021-02-15: 500 [IU]
  Filled 2021-02-15: qty 5

## 2021-02-15 MED ORDER — HEPARIN SOD (PORK) LOCK FLUSH 100 UNIT/ML IV SOLN
500.0000 [IU] | Freq: Once | INTRAVENOUS | Status: DC | PRN
  Filled 2021-02-15: qty 5

## 2021-02-15 NOTE — Patient Instructions (Signed)

## 2021-02-22 ENCOUNTER — Inpatient Hospital Stay (HOSPITAL_BASED_OUTPATIENT_CLINIC_OR_DEPARTMENT_OTHER): Payer: Medicare Other | Admitting: Internal Medicine

## 2021-02-22 ENCOUNTER — Other Ambulatory Visit: Payer: Self-pay | Admitting: Internal Medicine

## 2021-02-22 ENCOUNTER — Other Ambulatory Visit: Payer: Self-pay

## 2021-02-22 DIAGNOSIS — R21 Rash and other nonspecific skin eruption: Secondary | ICD-10-CM | POA: Diagnosis not present

## 2021-02-22 DIAGNOSIS — C3431 Malignant neoplasm of lower lobe, right bronchus or lung: Secondary | ICD-10-CM | POA: Diagnosis not present

## 2021-02-22 DIAGNOSIS — Z79899 Other long term (current) drug therapy: Secondary | ICD-10-CM | POA: Diagnosis not present

## 2021-02-22 MED ORDER — HYDROCOD POLST-CPM POLST ER 10-8 MG/5ML PO SUER: ORAL | 0 refills | Status: DC

## 2021-02-22 MED FILL — HYDROCODONE-CHLORPHEN ER SU: 10-8 | 28 days supply | Qty: 280 | Fill #0

## 2021-02-22 NOTE — Progress Notes (Signed)
#        [place] [phone]   [phone]  OFFICE PROGRESS NOTE  Golden Circle, FNP [address] [place] [address]  PRINCIPAL DIAGNOSIS: Local recurrence of non-small cell lung cancer, adenocarcinoma, initially diagnosed as stage IB (T2a N0 M0) in March of 2010.   PRIOR THERAPY:  1. Status post right lower lobectomy under the care of Dr. Cyndia Bent on 04/08/2009. 2. Status post palliative radiotherapy to the right lower lobe recurrent lung mass under the care of Dr. Lisbeth Renshaw, completed on June 08, 2011. 3. Systemic chemotherapy with carboplatin for AUC of 5 and Alimta 500 mg/M2 every 3 weeks. She is status post 3 cycles.  CURRENT THERAPY: Tarceva 150 mg by mouth daily, therapy beginning 07/24/2012. Status post approximately 102 months of therapy.   INTERVAL HISTORY: [name] 60 y.o. female returns to the clinic today for follow-up visit accompanied by her interpreter.  The patient is feeling fine today with no concerning complaints except for the persistent dry cough and occasional shortness of breath but no chest pain or hemoptysis.  She denied having any nausea, vomiting, diarrhea or constipation.  She has no headache or visual changes.  She has no recent weight loss or night sweats.  She continues to tolerate her treatment fairly well except for mild skin rash.  She was on vacation in [place] to attend her son's wedding.  She came back few weeks ago.  The patient had repeat CT scan of the chest performed recently and she is here for evaluation and discussion of her risk her results.  MEDICAL HISTORY: Past Medical History:  Diagnosis Date  . Drug-induced skin rash 02/27/2017  . Encounter for antineoplastic chemotherapy 01/19/2016  . History of radiation therapy 05/02/11 to 06/08/11   right lung  . Lung cancer ([place])    right lower lobe adenocarcinoma    ALLERGIES:  is allergic to codeine and doxycycline.  MEDICATIONS:  Current  Outpatient Medications  Medication Sig Dispense Refill  . acetaminophen (TYLENOL) 500 MG tablet Take 500 mg every 6 (six) hours as needed by mouth.    Marland Kitchen albuterol (PROAIR HFA) 108 (90 Base) MCG/ACT inhaler Inhale 1-2 puffs into the lungs every 6 (six) hours as needed for wheezing or shortness of breath. 1 Inhaler 2  . chlorpheniramine-HYDROcodone (TUSSIONEX) 10-8 MG/5ML SUER TAKE 5 MLS BY MOUTH TWICE DAILY 280 mL 0  . clindamycin (CLEOCIN T) 1 % lotion Apply to the skin rash twice daily. 60 mL 0  . diltiazem (TIAZAC) 120 MG 24 hr capsule Take 120 mg by mouth daily.    Marland Kitchen erlotinib (TARCEVA) 150 MG tablet Take 1 tablet (150 mg total) by mouth daily. Take on an empty stomach at least 1 hour before or 2 hours after food. 30 tablet 11  . lidocaine-prilocaine (EMLA) cream Apply 1 application topically as needed. 30 g 0   No current facility-administered medications for this visit.    SURGICAL HISTORY:  Past Surgical History:  Procedure Laterality Date  . LUNG LOBECTOMY  04/18/2009   RLL  . PORTACATH PLACEMENT  02/29/2012   Procedure: INSERTION PORT-A-CATH;  Surgeon: Nicanor Alcon, MD;  Location: [facility];  Service: Thoracic;  Laterality: Left;  PowerPort 8 F attachable in left internal jugular.    REVIEW OF SYSTEMS:  Constitutional: positive for fatigue Eyes: negative Ears, nose, mouth, throat, and face: negative Respiratory: positive for cough Cardiovascular: negative Gastrointestinal: negative Genitourinary:negative Integument/breast: positive for rash Hematologic/lymphatic:  negative Musculoskeletal:negative Neurological: negative Behavioral/Psych: negative Endocrine: negative Allergic/Immunologic: negative   PHYSICAL EXAMINATION: General appearance: alert, cooperative and no distress Head: Normocephalic, without obvious abnormality, atraumatic Neck: no adenopathy, no JVD, supple, symmetrical, trachea midline and thyroid not enlarged, symmetric, no tenderness/mass/nodules Lymph nodes:  Cervical, supraclavicular, and axillary nodes normal. Resp: clear to auscultation bilaterally Back: symmetric, no curvature. ROM normal. No CVA tenderness. Cardio: regular rate and rhythm, S1, S2 normal, no murmur, click, rub or gallop GI: soft, non-tender; bowel sounds normal; no masses,  no organomegaly Extremities: extremities normal, atraumatic, no cyanosis or edema Neurologic: Alert and oriented X 3, normal strength and tone. Normal symmetric reflexes. Normal coordination and gait  ECOG PERFORMANCE STATUS: 1 - Symptomatic but completely ambulatory  Blood pressure 138/63, pulse (!) 53, temperature 98.3 F (36.8 C), temperature source Tympanic, resp. rate 18, height 4\' 9"  (1.448 m), weight 124 lb 12.8 oz (56.6 kg), last menstrual period 02/10/2012, SpO2 99 %.  LABORATORY DATA: Lab Results  Component Value Date   WBC 12.9 (H) 02/15/2021   HGB 13.1 02/15/2021   HCT 41.4 02/15/2021   MCV 86.6 02/15/2021   PLT 251 02/15/2021      Chemistry      Component Value Date/Time   NA 138 02/15/2021 1158   NA 143 10/30/2017 1031   K 3.9 02/15/2021 1158   K 4.3 10/30/2017 1031   CL 103 02/15/2021 1158   CL 102 04/30/2013 1002   CO2 28 02/15/2021 1158   CO2 29 10/30/2017 1031   BUN 8 02/15/2021 1158   BUN 14.6 10/30/2017 1031   CREATININE 0.64 02/15/2021 1158   CREATININE 0.7 10/30/2017 1031      Component Value Date/Time   CALCIUM 8.5 (L) 02/15/2021 1158   CALCIUM 9.2 10/30/2017 1031   ALKPHOS 97 02/15/2021 1158   ALKPHOS 103 10/30/2017 1031   AST 38 02/15/2021 1158   AST 20 10/30/2017 1031   ALT 32 02/15/2021 1158   ALT 14 10/30/2017 1031   BILITOT 0.8 02/15/2021 1158   BILITOT 0.79 10/30/2017 1031       RADIOGRAPHIC STUDIES: CT Chest W Contrast  Result Date: 02/16/2021 CLINICAL DATA:  Non-small cell lung cancer restaging EXAM: CT CHEST WITH CONTRAST TECHNIQUE: Multidetector CT imaging of the chest was performed during intravenous contrast administration. CONTRAST:  67mL  OMNIPAQUE IOHEXOL 300 MG/ML  SOLN COMPARISON:  10/23/2020, 05/07/2020 FINDINGS: Cardiovascular: Left chest port catheter. Normal heart size. No pericardial effusion. Mediastinum/Nodes: Unchanged post treatment appearance of soft tissue about the right hilum. No discrete enlarged mediastinal, hilar, or axillary lymph nodes. Thyroid gland, trachea, and esophagus demonstrate no significant findings. Lungs/Pleura: Redemonstrated postoperative findings of right lower lobectomy. Unchanged post treatment appearance of a mass of the posterior right upper lobe, measuring 3.6 x 3.1 cm (series 2, image 65). Slight interval increase in size of mixed solid and ground-glass nodular opacities of the perihilar right lung, largest posteriorly measuring 2.2 x 1.8 cm, previously 1.6 x 1.5 cm (series 7, image 49) and inferiorly to this measuring 2.0 x 1.7 cm, previously 1.7 x 1.5 cm (series 7, image 56). Unchanged small right pleural effusion and pleural thickening. Upper Abdomen: No acute abnormality. Musculoskeletal: No chest wall mass or suspicious bone lesions identified. IMPRESSION: 1. Slight interval increase in size of mixed solid and ground-glass nodular opacities of the perihilar right lung, largest posteriorly measuring 2.2 x 1.8 cm, previously 1.6 x 1.5 cm and inferiorly to this measuring 2.0 x 1.7 cm, previously 1.7 x 1.5 cm.  Findings are concerning for worsened malignancy. 2. Unchanged post treatment appearance of a primary mass of the posterior right upper lobe, measuring 3.6 x 3.1 cm. 3. Unchanged post treatment appearance of soft tissue about the right hilum. 4. Unchanged small right pleural effusion and pleural thickening. Electronically Signed   By: Eddie Candle M.D.   On: 02/16/2021 09:34     ASSESSMENT AND PLAN:  This is a very pleasant 60 years old Asian female with recurrent non-small cell lung cancer, adenocarcinoma status post surgical resection followed by systemic chemotherapy with carboplatin and Alimta  followed by disease progression and the patient was started on treatment with Tarceva 150 mg by mouth daily status post 102 months  The patient continues to tolerate this treatment well except for mild skin rash and occasional diarrhea. She had repeat CT scan of the chest performed recently.  I personally and independently reviewed the scan images and discussed the result and showed the images to the patient today. Her scan showed a stable disease except for a slight interval increase in some of the groundglass nodular opacity in the perihilar right lung. I recommended for her to continue her current treatment with Tarceva with the same dose for now.  I will continue to monitor these nodules closely on the upcoming imaging studies. For the dry cough, I will give her a refill of Tussionex today. For the skin rash she will continue to apply clindamycin lotion to her skin rash area. The patient will come back for follow-up visit in around 6 weeks for evaluation and repeat blood work. She was advised to call immediately if she has any concerning symptoms in the interval.  All questions were answered. The patient knows to call the clinic with any problems, questions or concerns. We can certainly see the patient much sooner if necessary.  Disclaimer: This note was dictated with voice recognition software. Similar sounding words can inadvertently be transcribed and may not be corrected upon review.

## 2021-03-22 ENCOUNTER — Other Ambulatory Visit: Payer: Self-pay | Admitting: Physician Assistant

## 2021-04-05 ENCOUNTER — Other Ambulatory Visit: Payer: Self-pay

## 2021-04-05 ENCOUNTER — Inpatient Hospital Stay: Payer: Medicare Other | Attending: Internal Medicine

## 2021-04-05 ENCOUNTER — Other Ambulatory Visit (HOSPITAL_COMMUNITY): Payer: Self-pay

## 2021-04-05 ENCOUNTER — Inpatient Hospital Stay (HOSPITAL_BASED_OUTPATIENT_CLINIC_OR_DEPARTMENT_OTHER): Payer: Medicare Other | Admitting: Internal Medicine

## 2021-04-05 VITALS — BP 127/91 | HR 99 | Temp 97.6°F | Resp 18 | Wt 122.5 lb

## 2021-04-05 DIAGNOSIS — R059 Cough, unspecified: Secondary | ICD-10-CM | POA: Insufficient documentation

## 2021-04-05 DIAGNOSIS — C3490 Malignant neoplasm of unspecified part of unspecified bronchus or lung: Secondary | ICD-10-CM | POA: Diagnosis not present

## 2021-04-05 DIAGNOSIS — Z5111 Encounter for antineoplastic chemotherapy: Secondary | ICD-10-CM | POA: Diagnosis not present

## 2021-04-05 DIAGNOSIS — C3431 Malignant neoplasm of lower lobe, right bronchus or lung: Secondary | ICD-10-CM

## 2021-04-05 DIAGNOSIS — Z79899 Other long term (current) drug therapy: Secondary | ICD-10-CM | POA: Diagnosis not present

## 2021-04-05 DIAGNOSIS — Z923 Personal history of irradiation: Secondary | ICD-10-CM | POA: Insufficient documentation

## 2021-04-05 DIAGNOSIS — L270 Generalized skin eruption due to drugs and medicaments taken internally: Secondary | ICD-10-CM

## 2021-04-05 DIAGNOSIS — R21 Rash and other nonspecific skin eruption: Secondary | ICD-10-CM | POA: Insufficient documentation

## 2021-04-05 LAB — CBC WITH DIFFERENTIAL (CANCER CENTER ONLY)
Abs Immature Granulocytes: 0.02 10*3/uL (ref 0.00–0.07)
Basophils Absolute: 0 10*3/uL (ref 0.0–0.1)
Basophils Relative: 1 %
Eosinophils Absolute: 0.2 10*3/uL (ref 0.0–0.5)
Eosinophils Relative: 2 %
HCT: 42.5 % (ref 36.0–46.0)
Hemoglobin: 13.7 g/dL (ref 12.0–15.0)
Immature Granulocytes: 0 %
Lymphocytes Relative: 22 %
Lymphs Abs: 1.6 10*3/uL (ref 0.7–4.0)
MCH: 28.2 pg (ref 26.0–34.0)
MCHC: 32.2 g/dL (ref 30.0–36.0)
MCV: 87.6 fL (ref 80.0–100.0)
Monocytes Absolute: 0.5 10*3/uL (ref 0.1–1.0)
Monocytes Relative: 6 %
Neutro Abs: 5 10*3/uL (ref 1.7–7.7)
Neutrophils Relative %: 69 %
Platelet Count: 264 10*3/uL (ref 150–400)
RBC: 4.85 MIL/uL (ref 3.87–5.11)
RDW: 14.3 % (ref 11.5–15.5)
WBC Count: 7.3 10*3/uL (ref 4.0–10.5)
nRBC: 0 % (ref 0.0–0.2)

## 2021-04-05 LAB — CMP (CANCER CENTER ONLY)
ALT: 15 U/L (ref 0–44)
AST: 24 U/L (ref 15–41)
Albumin: 3.8 g/dL (ref 3.5–5.0)
Alkaline Phosphatase: 82 U/L (ref 38–126)
Anion gap: 11 (ref 5–15)
BUN: 11 mg/dL (ref 6–20)
CO2: 28 mmol/L (ref 22–32)
Calcium: 8.9 mg/dL (ref 8.9–10.3)
Chloride: 104 mmol/L (ref 98–111)
Creatinine: 0.71 mg/dL (ref 0.44–1.00)
GFR, Estimated: >60 mL/min (ref >60)
Glucose, Bld: 123 mg/dL — ABNORMAL HIGH (ref 70–99)
Potassium: 4.2 mmol/L (ref 3.5–5.1)
Sodium: 143 mmol/L (ref 135–145)
Total Bilirubin: 1 mg/dL (ref 0.3–1.2)
Total Protein: 7.4 g/dL (ref 6.5–8.1)

## 2021-04-05 MED ORDER — HYDROCOD POLST-CPM POLST ER 10-8 MG/5ML PO SUER
ORAL | 0 refills | Status: DC
  Filled 2021-04-05: qty 280, 28d supply, fill #0

## 2021-04-05 NOTE — Progress Notes (Signed)
#        [place] [phone]) [phone]   [phone]  OFFICE PROGRESS NOTE  [place], FNP [address] [place] [address]  PRINCIPAL DIAGNOSIS: Local recurrence of non-small cell lung cancer, adenocarcinoma, initially diagnosed as stage IB (T2a N0 M0) in March of 2010.   PRIOR THERAPY:  1. Status post right lower lobectomy under the care of Dr. Cyndia Bent on 04/08/2009. 2. Status post palliative radiotherapy to the right lower lobe recurrent lung mass under the care of Dr. Lisbeth Renshaw, completed on June 08, 2011. 3. Systemic chemotherapy with carboplatin for AUC of 5 and Alimta 500 mg/M2 every 3 weeks. She is status post 3 cycles.  CURRENT THERAPY: Tarceva 150 mg by mouth daily, therapy beginning 07/24/2012. Status post approximately 104 months of therapy.   INTERVAL HISTORY: [name] 60 y.o. female returns to the clinic today for follow-up visit accompanied by her interpreter.  The patient is feeling fine today with no concerning complaints except for the mild skin rash and dry cough.  She denied having any chest pain, shortness of breath or hemoptysis.  She denied having any nausea, vomiting, diarrhea or constipation.  She has no headache or visual changes.  She is here today for evaluation and repeat blood work.  She continues to tolerate her treatment with Tarceva fairly well.  MEDICAL HISTORY: Past Medical History:  Diagnosis Date  . Drug-induced skin rash 02/27/2017  . Encounter for antineoplastic chemotherapy 01/19/2016  . History of radiation therapy 05/02/11 to 06/08/11   right lung  . Lung cancer ([facility])    right lower lobe adenocarcinoma    ALLERGIES:  is allergic to codeine and doxycycline.  MEDICATIONS:  Current Outpatient Medications  Medication Sig Dispense Refill  . acetaminophen (TYLENOL) 500 MG tablet Take 500 mg every 6 (six) hours as needed by mouth.    Marland Kitchen albuterol (PROAIR HFA) 108 (90 Base) MCG/ACT inhaler Inhale 1-2 puffs  into the lungs every 6 (six) hours as needed for wheezing or shortness of breath. 1 Inhaler 2  . chlorpheniramine-HYDROcodone (TUSSIONEX) 10-8 MG/5ML SUER TAKE 5 MLS BY MOUTH TWICE DAILY 280 mL 0  . clindamycin (CLEOCIN T) 1 % lotion APPLY TO SKIN RASH TWICE DAILY. 60 mL 0  . erlotinib (TARCEVA) 150 MG tablet Take 1 tablet (150 mg total) by mouth daily. Take on an empty stomach at least 1 hour before or 2 hours after food. 30 tablet 11  . lidocaine-prilocaine (EMLA) cream Apply 1 application topically as needed. 30 g 0  . TIADYLT ER 120 MG 24 hr capsule TAKE 1 CAPSULE BY MOUTH EVERY DAY 90 capsule 3   No current facility-administered medications for this visit.    SURGICAL HISTORY:  Past Surgical History:  Procedure Laterality Date  . LUNG LOBECTOMY  04/18/2009   RLL  . PORTACATH PLACEMENT  02/29/2012   Procedure: INSERTION PORT-A-CATH;  Surgeon: Nicanor Alcon, MD;  Location: [facility];  Service: Thoracic;  Laterality: Left;  PowerPort 8 F attachable in left internal jugular.    REVIEW OF SYSTEMS:  A comprehensive review of systems was negative except for: Respiratory: positive for cough Integument/breast: positive for dryness and rash   PHYSICAL EXAMINATION: General appearance: alert, cooperative and no distress Head: Normocephalic, without obvious abnormality, atraumatic Neck: no adenopathy, no JVD, supple, symmetrical, trachea midline and thyroid not enlarged, symmetric, no tenderness/mass/nodules Lymph nodes: Cervical, supraclavicular, and axillary nodes normal. Resp: clear to auscultation bilaterally Back: symmetric, no  curvature. ROM normal. No CVA tenderness. Cardio: regular rate and rhythm, S1, S2 normal, no murmur, click, rub or gallop GI: soft, non-tender; bowel sounds normal; no masses,  no organomegaly Extremities: extremities normal, atraumatic, no cyanosis or edema  ECOG PERFORMANCE STATUS: 1 - Symptomatic but completely ambulatory  Blood pressure (!) 127/91, pulse 99,  temperature 97.6 F (36.4 C), temperature source Tympanic, resp. rate 18, weight 122 lb 8 oz (55.6 kg), last menstrual period 02/10/2012, SpO2 99 %.  LABORATORY DATA: Lab Results  Component Value Date   WBC 7.3 04/05/2021   HGB 13.7 04/05/2021   HCT 42.5 04/05/2021   MCV 87.6 04/05/2021   PLT 264 04/05/2021      Chemistry      Component Value Date/Time   NA 143 04/05/2021 1417   NA 143 10/30/2017 1031   K 4.2 04/05/2021 1417   K 4.3 10/30/2017 1031   CL 104 04/05/2021 1417   CL 102 04/30/2013 1002   CO2 28 04/05/2021 1417   CO2 29 10/30/2017 1031   BUN 11 04/05/2021 1417   BUN 14.6 10/30/2017 1031   CREATININE 0.71 04/05/2021 1417   CREATININE 0.7 10/30/2017 1031      Component Value Date/Time   CALCIUM 8.9 04/05/2021 1417   CALCIUM 9.2 10/30/2017 1031   ALKPHOS 82 04/05/2021 1417   ALKPHOS 103 10/30/2017 1031   AST 24 04/05/2021 1417   AST 20 10/30/2017 1031   ALT 15 04/05/2021 1417   ALT 14 10/30/2017 1031   BILITOT 1.0 04/05/2021 1417   BILITOT 0.79 10/30/2017 1031       RADIOGRAPHIC STUDIES: No results found.   ASSESSMENT AND PLAN:  This is a very pleasant 60 years old Asian female with recurrent non-small cell lung cancer, adenocarcinoma status post surgical resection followed by systemic chemotherapy with carboplatin and Alimta followed by disease progression and the patient was started on treatment with Tarceva 150 mg by mouth daily status post 104 months  The patient has been tolerating this treatment with Tarceva fairly well with no concerning adverse effects except for the mild skin rash and diarrhea. I recommended for her to continue her current treatment with Tarceva with the same dose. I will see her back for follow-up visit in 6 weeks with repeat CT scan of the chest for restaging of her disease For the dry cough, I will give her a refill of Tussionex today. For the skin rash she will continue to apply clindamycin lotion to her skin rash area. She  was advised to call immediately if she has any concerning symptoms in the interval  All questions were answered. The patient knows to call the clinic with any problems, questions or concerns. We can certainly see the patient much sooner if necessary.  Disclaimer: This note was dictated with voice recognition software. Similar sounding words can inadvertently be transcribed and may not be corrected upon review.

## 2021-04-15 ENCOUNTER — Telehealth: Payer: Self-pay | Admitting: Physician Assistant

## 2021-04-15 MED ORDER — DILTIAZEM HCL ER BEADS 120 MG PO CP24
ORAL_CAPSULE | ORAL | 2 refills | Status: DC
Start: 2021-04-15 — End: 2021-06-21

## 2021-04-15 NOTE — Telephone Encounter (Signed)
Pt's medication was sent to pt's pharmacy as requested. Confirmation received.  °

## 2021-04-15 NOTE — Telephone Encounter (Signed)
*  STAT* If patient is at the pharmacy, call can be transferred to refill team.   1. Which medications need to be refilled? (please list name of each medication and dose if known) TIADYLT ER 120 MG 24 hr capsule  2. Which pharmacy/location (including street and city if local pharmacy) is medication to be sent to? [id number] - [place], [place].  3. Do they need a 30 day or 90 day supply? 90 day supply

## 2021-05-17 ENCOUNTER — Other Ambulatory Visit: Payer: Self-pay

## 2021-05-17 ENCOUNTER — Inpatient Hospital Stay: Payer: Medicare Other | Attending: Internal Medicine

## 2021-05-17 ENCOUNTER — Ambulatory Visit (HOSPITAL_COMMUNITY)
Admission: RE | Admit: 2021-05-17 | Discharge: 2021-05-17 | Disposition: A | Discharge status: Home / Self Care | Payer: Medicare Other | Source: Ambulatory Visit | Attending: Internal Medicine | Admitting: Internal Medicine

## 2021-05-17 DIAGNOSIS — R197 Diarrhea, unspecified: Secondary | ICD-10-CM | POA: Diagnosis not present

## 2021-05-17 DIAGNOSIS — Z923 Personal history of irradiation: Secondary | ICD-10-CM | POA: Diagnosis not present

## 2021-05-17 DIAGNOSIS — R21 Rash and other nonspecific skin eruption: Secondary | ICD-10-CM | POA: Diagnosis not present

## 2021-05-17 DIAGNOSIS — C3431 Malignant neoplasm of lower lobe, right bronchus or lung: Secondary | ICD-10-CM | POA: Insufficient documentation

## 2021-05-17 DIAGNOSIS — Z79899 Other long term (current) drug therapy: Secondary | ICD-10-CM | POA: Insufficient documentation

## 2021-05-17 DIAGNOSIS — J90 Pleural effusion, not elsewhere classified: Secondary | ICD-10-CM | POA: Diagnosis not present

## 2021-05-17 DIAGNOSIS — C3490 Malignant neoplasm of unspecified part of unspecified bronchus or lung: Secondary | ICD-10-CM

## 2021-05-17 DIAGNOSIS — R059 Cough, unspecified: Secondary | ICD-10-CM | POA: Diagnosis not present

## 2021-05-17 DIAGNOSIS — J929 Pleural plaque without asbestos: Secondary | ICD-10-CM | POA: Diagnosis not present

## 2021-05-17 DIAGNOSIS — I700 Atherosclerosis of aorta: Secondary | ICD-10-CM | POA: Diagnosis not present

## 2021-05-17 LAB — CMP (CANCER CENTER ONLY)
ALT: 25 U/L (ref 0–44)
AST: 28 U/L (ref 15–41)
Albumin: 4.2 g/dL (ref 3.5–5.0)
Alkaline Phosphatase: 82 U/L (ref 38–126)
Anion gap: 5 (ref 5–15)
BUN: 12 mg/dL (ref 6–20)
CO2: 31 mmol/L (ref 22–32)
Calcium: 9 mg/dL (ref 8.9–10.3)
Chloride: 104 mmol/L (ref 98–111)
Creatinine: 0.65 mg/dL (ref 0.44–1.00)
GFR, Estimated: >60 mL/min (ref >60)
Glucose, Bld: 93 mg/dL (ref 70–99)
Potassium: 4.1 mmol/L (ref 3.5–5.1)
Sodium: 140 mmol/L (ref 135–145)
Total Bilirubin: 1 mg/dL (ref 0.3–1.2)
Total Protein: 8.1 g/dL (ref 6.5–8.1)

## 2021-05-17 LAB — CBC WITH DIFFERENTIAL (CANCER CENTER ONLY)
Abs Immature Granulocytes: 0.02 10*3/uL (ref 0.00–0.07)
Basophils Absolute: 0 10*3/uL (ref 0.0–0.1)
Basophils Relative: 1 %
Eosinophils Absolute: 0.2 10*3/uL (ref 0.0–0.5)
Eosinophils Relative: 3 %
HCT: 41.5 % (ref 36.0–46.0)
Hemoglobin: 13.7 g/dL (ref 12.0–15.0)
Immature Granulocytes: 0 %
Lymphocytes Relative: 26 %
Lymphs Abs: 1.9 10*3/uL (ref 0.7–4.0)
MCH: 28.8 pg (ref 26.0–34.0)
MCHC: 33 g/dL (ref 30.0–36.0)
MCV: 87.2 fL (ref 80.0–100.0)
Monocytes Absolute: 0.6 10*3/uL (ref 0.1–1.0)
Monocytes Relative: 8 %
Neutro Abs: 4.7 10*3/uL (ref 1.7–7.7)
Neutrophils Relative %: 62 %
Platelet Count: 298 10*3/uL (ref 150–400)
RBC: 4.76 MIL/uL (ref 3.87–5.11)
RDW: 14.6 % (ref 11.5–15.5)
WBC Count: 7.5 10*3/uL (ref 4.0–10.5)
nRBC: 0 % (ref 0.0–0.2)

## 2021-05-17 MED ORDER — HEPARIN SOD (PORK) LOCK FLUSH 100 UNIT/ML IV SOLN: 500.0000 [IU] | Freq: Once | INTRAVENOUS | Status: DC

## 2021-05-17 MED ORDER — IOHEXOL 300 MG/ML  SOLN
75.0000 mL | Freq: Once | INTRAMUSCULAR | Status: AC | PRN
  Administered 2021-05-17: 75 mL via INTRAVENOUS

## 2021-05-17 MED ORDER — HEPARIN SOD (PORK) LOCK FLUSH 100 UNIT/ML IV SOLN
INTRAVENOUS | Status: AC
  Filled 2021-05-17: qty 5

## 2021-05-17 MED ORDER — SODIUM CHLORIDE (PF) 0.9 % IJ SOLN
INTRAMUSCULAR | Status: AC
  Filled 2021-05-17: qty 50

## 2021-05-24 ENCOUNTER — Other Ambulatory Visit: Payer: Self-pay

## 2021-05-24 ENCOUNTER — Encounter: Payer: Self-pay | Admitting: Internal Medicine

## 2021-05-24 ENCOUNTER — Inpatient Hospital Stay (HOSPITAL_BASED_OUTPATIENT_CLINIC_OR_DEPARTMENT_OTHER): Payer: Medicare Other | Admitting: Internal Medicine

## 2021-05-24 ENCOUNTER — Other Ambulatory Visit (HOSPITAL_COMMUNITY): Payer: Self-pay

## 2021-05-24 VITALS — BP 138/80 | HR 60 | Temp 97.7°F | Resp 18 | Ht <= 58 in | Wt 120.3 lb

## 2021-05-24 DIAGNOSIS — R21 Rash and other nonspecific skin eruption: Secondary | ICD-10-CM | POA: Diagnosis not present

## 2021-05-24 DIAGNOSIS — R197 Diarrhea, unspecified: Secondary | ICD-10-CM | POA: Diagnosis not present

## 2021-05-24 DIAGNOSIS — Z923 Personal history of irradiation: Secondary | ICD-10-CM | POA: Diagnosis not present

## 2021-05-24 DIAGNOSIS — R059 Cough, unspecified: Secondary | ICD-10-CM | POA: Diagnosis not present

## 2021-05-24 DIAGNOSIS — Z79899 Other long term (current) drug therapy: Secondary | ICD-10-CM | POA: Diagnosis not present

## 2021-05-24 DIAGNOSIS — Z5111 Encounter for antineoplastic chemotherapy: Secondary | ICD-10-CM

## 2021-05-24 DIAGNOSIS — C3431 Malignant neoplasm of lower lobe, right bronchus or lung: Secondary | ICD-10-CM | POA: Diagnosis not present

## 2021-05-24 MED ORDER — HYDROCOD POLST-CPM POLST ER 10-8 MG/5ML PO SUER
ORAL | 0 refills | Status: DC
  Filled 2021-05-24: qty 280, 28d supply, fill #0

## 2021-05-24 MED ORDER — DOXYCYCLINE HYCLATE 100 MG PO TABS
100.0000 mg | ORAL_TABLET | Freq: Two times a day (BID) | ORAL | 0 refills | Status: DC
  Filled 2021-05-24: qty 30, 15d supply, fill #0

## 2021-05-24 NOTE — Progress Notes (Signed)
#        [place] [phone]   Fax:(336) [phone]  OFFICE PROGRESS NOTE  System, Provider Not In No address on file  PRINCIPAL DIAGNOSIS: Local recurrence of non-small cell lung cancer, adenocarcinoma, initially diagnosed as stage IB (T2a N0 M0) in March of 2010.   PRIOR THERAPY:  Status post right lower lobectomy under the care of Dr. Cyndia Bent on 04/08/2009. Status post palliative radiotherapy to the right lower lobe recurrent lung mass under the care of Dr. Lisbeth Renshaw, completed on June 08, 2011. Systemic chemotherapy with carboplatin for AUC of 5 and Alimta 500 mg/M2 every 3 weeks. She is status post 3 cycles.  CURRENT THERAPY: Tarceva 150 mg by mouth daily, therapy beginning 07/24/2012. Status post approximately 97 months of therapy.   INTERVAL HISTORY: [name] 60 y.o. female returns to the clinic today for follow-up visit accompanied by her interpreter.  The patient is feeling fine today with no concerning complaints except for the rash on the eyebrows as well as some nasal bumps.  She denied having any current chest pain but continues to have dry cough with no shortness of breath or hemoptysis.  The patient denied having any nausea, vomiting, diarrhea or constipation.  She denied having any headache or visual changes.  She continues to tolerate her treatment with Tarceva fairly well except for the dry skin and nail changes as well as the skin rash.  She also has few episodes of diarrhea.  She had repeat CT scan of the chest performed recently and she is here for evaluation and discussion of her risk her results.  MEDICAL HISTORY: Past Medical History:  Diagnosis Date   Drug-induced skin rash 02/27/2017   Encounter for antineoplastic chemotherapy 01/19/2016   History of radiation therapy 05/02/11 to 06/08/11   right lung   Lung cancer ([facility])    right lower lobe adenocarcinoma    ALLERGIES:  is allergic to codeine and doxycycline.  MEDICATIONS:  Current  Outpatient Medications  Medication Sig Dispense Refill   acetaminophen (TYLENOL) 500 MG tablet Take 500 mg every 6 (six) hours as needed by mouth.     albuterol (PROAIR HFA) 108 (90 Base) MCG/ACT inhaler Inhale 1-2 puffs into the lungs every 6 (six) hours as needed for wheezing or shortness of breath. 1 Inhaler 2   chlorpheniramine-HYDROcodone (TUSSIONEX) 10-8 MG/5ML SUER TAKE 5 MLS BY MOUTH TWICE DAILY 280 mL 0   clindamycin (CLEOCIN T) 1 % lotion APPLY TO SKIN RASH TWICE DAILY. 60 mL 0   diltiazem (TIADYLT ER) 120 MG 24 hr capsule TAKE 1 CAPSULE BY MOUTH EVERY DAY 90 capsule 2   erlotinib (TARCEVA) 150 MG tablet Take 1 tablet (150 mg total) by mouth daily. Take on an empty stomach at least 1 hour before or 2 hours after food. 30 tablet 11   lidocaine-prilocaine (EMLA) cream Apply 1 application topically as needed. 30 g 0   No current facility-administered medications for this visit.    SURGICAL HISTORY:  Past Surgical History:  Procedure Laterality Date   LUNG LOBECTOMY  04/18/2009   RLL   PORTACATH PLACEMENT  02/29/2012   Procedure: INSERTION PORT-A-CATH;  Surgeon: Nicanor Alcon, MD;  Location: [facility];  Service: Thoracic;  Laterality: Left;  PowerPort 8 F attachable in left internal jugular.    REVIEW OF SYSTEMS:  Constitutional: positive for fatigue Eyes: negative Ears, nose, mouth, throat, and face: negative Respiratory: positive for cough Cardiovascular: negative Gastrointestinal: negative Genitourinary:negative Integument/breast: positive for  rash Hematologic/lymphatic: negative Musculoskeletal:negative Neurological: negative Behavioral/Psych: negative Endocrine: negative Allergic/Immunologic: negative   PHYSICAL EXAMINATION: General appearance: alert, cooperative, and no distress Head: Normocephalic, without obvious abnormality, atraumatic Neck: no adenopathy, no JVD, supple, symmetrical, trachea midline, and thyroid not enlarged, symmetric, no  tenderness/mass/nodules Lymph nodes: Cervical, supraclavicular, and axillary nodes normal. Resp: clear to auscultation bilaterally Back: symmetric, no curvature. ROM normal. No CVA tenderness. Cardio: regular rate and rhythm, S1, S2 normal, no murmur, click, rub or gallop GI: soft, non-tender; bowel sounds normal; no masses,  no organomegaly Extremities: extremities normal, atraumatic, no cyanosis or edema Neurologic: Alert and oriented X 3, normal strength and tone. Normal symmetric reflexes. Normal coordination and gait  ECOG PERFORMANCE STATUS: 1 - Symptomatic but completely ambulatory  Blood pressure 138/80, pulse 60, temperature 97.7 F (36.5 C), temperature source Tympanic, resp. rate 18, height 4\' 9"  (1.448 m), weight 120 lb 4.8 oz (54.6 kg), last menstrual period 02/10/2012, SpO2 100 %.  LABORATORY DATA: Lab Results  Component Value Date   WBC 7.5 05/17/2021   HGB 13.7 05/17/2021   HCT 41.5 05/17/2021   MCV 87.2 05/17/2021   PLT 298 05/17/2021      Chemistry      Component Value Date/Time   NA 140 05/17/2021 0942   NA 143 10/30/2017 1031   K 4.1 05/17/2021 0942   K 4.3 10/30/2017 1031   CL 104 05/17/2021 0942   CL 102 04/30/2013 1002   CO2 31 05/17/2021 0942   CO2 29 10/30/2017 1031   BUN 12 05/17/2021 0942   BUN 14.6 10/30/2017 1031   CREATININE 0.65 05/17/2021 0942   CREATININE 0.7 10/30/2017 1031      Component Value Date/Time   CALCIUM 9.0 05/17/2021 0942   CALCIUM 9.2 10/30/2017 1031   ALKPHOS 82 05/17/2021 0942   ALKPHOS 103 10/30/2017 1031   AST 28 05/17/2021 0942   AST 20 10/30/2017 1031   ALT 25 05/17/2021 0942   ALT 14 10/30/2017 1031   BILITOT 1.0 05/17/2021 0942   BILITOT 0.79 10/30/2017 1031       RADIOGRAPHIC STUDIES: CT Chest W Contrast  Result Date: 05/18/2021 CLINICAL DATA:  Non-small cell lung cancer restaging, ongoing chemotherapy EXAM: CT CHEST WITH CONTRAST TECHNIQUE: Multidetector CT imaging of the chest was performed during  intravenous contrast administration. CONTRAST:  60mL OMNIPAQUE IOHEXOL 300 MG/ML  SOLN COMPARISON:  02/15/2021 FINDINGS: Cardiovascular: Left chest port catheter. Scattered aortic atherosclerosis. Normal heart size. No pericardial effusion. Mediastinum/Nodes: Unchanged post treatment appearance of soft tissue about the right hilum. No discretely enlarged mediastinal, hilar, or axillary lymph nodes. Thyroid gland, trachea, and esophagus demonstrate no significant findings. Lungs/Pleura: Redemonstrated postoperative findings of right lower lobectomy. No significant change in post treatment appearance of the right chest, with a dominant treated mass of the posterior right upper lobe measuring 2.1 x 1.9 cm (series 7, image 61) and additional perihilar nodule anterior to this measuring 2.0 x 1.9 cm (series 7, image 68). Unchanged small, loculated right pleural effusion and associated pleural thickening. Upper Abdomen: No acute abnormality. Musculoskeletal: No chest wall mass or suspicious bone lesions identified. IMPRESSION: 1. Redemonstrated postoperative findings of right lower lobectomy. 2. No significant change in post treatment appearance of the right chest, with a dominant treated mass of the posterior right upper lobe and an additional perihilar nodule anterior to this unchanged. 3. Unchanged small, loculated right pleural effusion and associated pleural thickening. Aortic Atherosclerosis (ICD10-I70.0). Electronically Signed   By: Dorna Bloom.D.  On: 05/18/2021 08:19     ASSESSMENT AND PLAN:  This is a very pleasant 60 years old Asian female with recurrent non-small cell lung cancer, adenocarcinoma status post surgical resection followed by systemic chemotherapy with carboplatin and Alimta followed by disease progression and the patient was started on treatment with Tarceva 150 mg by mouth daily status post 97 months  The patient has been tolerating this treatment well with no concerning adverse effects  except for the skin rash mainly on the face as well as dry skin and few episodes of diarrhea. She had repeat CT scan of the chest performed recently.  I personally and independently reviewed the scans and discussed the results with the patient today. Her scan showed no concerning findings for disease progression. I recommended for her to continue her current treatment with Tarceva with the same dose. For the dry cough I will give her a refill of Tussionex. For the worsening skin rash she will continue to apply clindamycin lotion and I will give her prescription for doxycycline 100 mg p.o. twice daily for the next 2 weeks. The patient will come back for follow-up visit in 2 months for evaluation and repeat blood work. She was advised to call immediately if she has any concerning symptoms in the interval.  All questions were answered. The patient knows to call the clinic with any problems, questions or concerns. We can certainly see the patient much sooner if necessary.  Disclaimer: This note was dictated with voice recognition software. Similar sounding words can inadvertently be transcribed and may not be corrected upon review.

## 2021-06-11 DIAGNOSIS — Z20822 Contact with and (suspected) exposure to covid-19: Secondary | ICD-10-CM | POA: Diagnosis not present

## 2021-06-18 NOTE — Progress Notes (Signed)
PCP:  System, Provider Not In Primary Cardiologist: None Electrophysiologist: Dr. Matilde Sprang is a 60 y.o. female with history of  NSCLC, adenocarcinoma (s/p RLL lobectomy surgery in 2010, and radiation therapy to recurrent lung mass completed 2012 and chemo completed 2012, maintained on Tarceva started 2013 to present), cronic dry cough, SVT.  Seen today for Dr. Caryl Comes for routine electrophysiology followup.  Since last being seen in our clinic the patient reports doing well. She had a time a few weeks ago where she was having palpitations almost daily. Currently, it is well controlled. Exercise is limited chronically by cough. Her palpitations bother her most at night. Currently, she denies chest pain, PND, orthopnea, nausea, vomiting, dizziness, syncope, edema, weight gain, or early satiety.  Past Medical History:  Diagnosis Date   Drug-induced skin rash 02/27/2017   Encounter for antineoplastic chemotherapy 01/19/2016   History of radiation therapy 05/02/11 to 06/08/11   right lung   Lung cancer [facility])    right lower lobe adenocarcinoma   Past Surgical History:  Procedure Laterality Date   LUNG LOBECTOMY  04/18/2009   RLL   PORTACATH PLACEMENT  02/29/2012   Procedure: INSERTION PORT-A-CATH;  Surgeon: Nicanor Alcon, MD;  Location: [facility];  Service: Thoracic;  Laterality: Left;  PowerPort 8 F attachable in left internal jugular.    Current Outpatient Medications  Medication Sig Dispense Refill   acetaminophen (TYLENOL) 500 MG tablet Take 500 mg every 6 (six) hours as needed by mouth.     albuterol (PROAIR HFA) 108 (90 Base) MCG/ACT inhaler Inhale 1-2 puffs into the lungs every 6 (six) hours as needed for wheezing or shortness of breath. 1 Inhaler 2   chlorpheniramine-HYDROcodone (TUSSIONEX) 10-8 MG/5ML SUER TAKE 5 MLS BY MOUTH TWICE DAILY 280 mL 0   clindamycin (CLEOCIN T) 1 % lotion APPLY TO SKIN RASH TWICE DAILY. 60 mL 0   diltiazem (TIADYLT ER) 120 MG 24 hr capsule TAKE 1 CAPSULE BY  MOUTH EVERY DAY 90 capsule 2   doxycycline (VIBRA-TABS) 100 MG tablet Take 1 tablet (100 mg total) by mouth 2 (two) times daily. 30 tablet 0   erlotinib (TARCEVA) 150 MG tablet Take 1 tablet (150 mg total) by mouth daily. Take on an empty stomach at least 1 hour before or 2 hours after food. 30 tablet 11   lidocaine-prilocaine (EMLA) cream Apply 1 application topically as needed. 30 g 0   No current facility-administered medications for this visit.    Allergies  Allergen Reactions   Codeine Nausea Only   Doxycycline Nausea And Vomiting    vomiting and headache    Social History   Socioeconomic History   Marital status: Married    Spouse name: Not on file   Number of children: 7   Years of education: Not on file   Highest education level: Not on file  Occupational History   Not on file  Tobacco Use   Smoking status: Never   Smokeless tobacco: Never  Vaping Use   Vaping Use: Never used  Substance and Sexual Activity   Alcohol use: No   Drug use: No   Sexual activity: Yes  Other Topics Concern   Not on file  Social History Narrative   09/01/2014   The patient is a 60 year old [place] woman.   The patient is married and has 7 children. One child passed away.   The patient came to the [place] in approximately 1990. They were in [place] for  10-11 years and then moved to [place], [place] since then.   Patient does not smoke, drink, or use illicit drugs.   Patient has not used smokeless tobacco products.   Fun/Hobby: Go shopping    Social Determinants of Health   Financial Resource Strain: Not on file  Food Insecurity: Not on file  Transportation Needs: Not on file  Physical Activity: Not on file  Stress: Not on file  Social Connections: Not on file  Intimate Partner Violence: Not on file     Review of Systems: General: No chills, fever, night sweats or weight changes  Cardiovascular:  No chest pain, dyspnea on exertion, edema, orthopnea,  palpitations, paroxysmal nocturnal dyspnea Dermatological: No rash, lesions or masses Respiratory: No cough, dyspnea Urologic: No hematuria, dysuria Abdominal: No nausea, vomiting, diarrhea, bright red blood per rectum, melena, or hematemesis Neurologic: No visual changes, weakness, changes in mental status All other systems reviewed and are otherwise negative except as noted above.  Physical Exam: Vitals:   06/21/21 0856  BP: 122/80  Pulse: 96  SpO2: 98%  Weight: 120 lb 12.8 oz (54.8 kg)  Height: 4\' 9"  (1.448 m)    GEN- The patient is well appearing, alert and oriented x 3 today.   HEENT: normocephalic, atraumatic; sclera clear, conjunctiva pink; hearing intact; oropharynx clear; neck supple, no JVP Lymph- no cervical lymphadenopathy Lungs- Clear to ausculation bilaterally, normal work of breathing.  No wheezes, rales, rhonchi Heart- Regular rate and rhythm, no murmurs, rubs or gallops, PMI not laterally displaced GI- soft, non-tender, non-distended, bowel sounds present, no hepatosplenomegaly Extremities- no clubbing, cyanosis, or edema; DP/PT/radial pulses 2+ bilaterally MS- no significant deformity or atrophy Skin- warm and dry, no rash or lesion Psych- euthymic mood, full affect Neuro- strength and sensation are intact  EKG is not ordered. Personal review of EKG from  12/10/2020  shows NSR vs low atrial rhythm at 98 bpm (Known)  Additional studies reviewed include: Previous office notes  Assessment and Plan:  1. PSVT Thought to be ectopic atrial rhythm Continue diltiazem 120 mg daily. Can titrated as needed, observing closely for bradycardia.  At times aware of faster HR but no associated symptoms, and not necessarily "racing" More aware of her heart beat at night, sometimes when rushing, no change in behavior Discussed possibility of increasing long acting diltiazem or adding short acting if needed for symptoms.       2. Post termination pauses noted in hospital  2018 No symptoms of bradycardia are elicited  Shirley Friar, PA-C  06/21/21 9:03 AM

## 2021-06-21 ENCOUNTER — Ambulatory Visit: Payer: Medicare Other | Admitting: Physician Assistant

## 2021-06-21 ENCOUNTER — Other Ambulatory Visit (HOSPITAL_COMMUNITY): Payer: Self-pay

## 2021-06-21 ENCOUNTER — Encounter: Payer: Self-pay | Admitting: Student

## 2021-06-21 ENCOUNTER — Ambulatory Visit (INDEPENDENT_AMBULATORY_CARE_PROVIDER_SITE_OTHER): Payer: Medicare Other | Admitting: Student

## 2021-06-21 ENCOUNTER — Other Ambulatory Visit: Payer: Self-pay

## 2021-06-21 VITALS — BP 122/80 | HR 96 | Ht <= 58 in | Wt 120.8 lb

## 2021-06-21 DIAGNOSIS — I471 Supraventricular tachycardia: Secondary | ICD-10-CM

## 2021-06-21 DIAGNOSIS — I491 Atrial premature depolarization: Secondary | ICD-10-CM

## 2021-06-21 MED ORDER — DILTIAZEM HCL ER BEADS 120 MG PO CP24
120.0000 mg | ORAL_CAPSULE | Freq: Every day | ORAL | 3 refills | Status: DC
  Filled 2021-06-21 – 2021-07-26 (×2): qty 90, 90d supply, fill #0

## 2021-06-21 NOTE — Patient Instructions (Signed)
Medication Instructions:  Your physician recommends that you continue on your current medications as directed. Please refer to the Current Medication list given to you today.  *If you need a refill on your cardiac medications before your next appointment, please call your pharmacy*   Lab Work: None If you have labs (blood work) drawn today and your tests are completely normal, you will receive your results only by: MyChart Message (if you have MyChart) OR A paper copy in the mail If you have any lab test that is abnormal or we need to change your treatment, we will call you to review the results.   Follow-Up: At [facility], you and your health needs are our priority.  As part of our continuing mission to provide you with exceptional heart care, we have created designated Provider Care Teams.  These Care Teams include your primary Cardiologist (physician) and Advanced Practice Providers (APPs -  Physician Assistants and Nurse Practitioners) who all work together to provide you with the care you need, when you need it.  Your next appointment:   6 month(s)  The format for your next appointment:   In Person  Provider:   You may see Steven Klein, MD or one of the following Advanced Practice Providers on your designated Care Team:   Renee Ursuy, PA-C Michael "Andy" Tillery, PA-C    

## 2021-06-22 ENCOUNTER — Other Ambulatory Visit (HOSPITAL_COMMUNITY): Payer: Self-pay

## 2021-06-28 DIAGNOSIS — Z124 Encounter for screening for malignant neoplasm of cervix: Secondary | ICD-10-CM | POA: Diagnosis not present

## 2021-06-28 DIAGNOSIS — N958 Other specified menopausal and perimenopausal disorders: Secondary | ICD-10-CM | POA: Diagnosis not present

## 2021-06-28 DIAGNOSIS — Z1231 Encounter for screening mammogram for malignant neoplasm of breast: Secondary | ICD-10-CM | POA: Diagnosis not present

## 2021-06-28 DIAGNOSIS — M8588 Other specified disorders of bone density and structure, other site: Secondary | ICD-10-CM | POA: Diagnosis not present

## 2021-06-28 DIAGNOSIS — Z6825 Body mass index (BMI) 25.0-25.9, adult: Secondary | ICD-10-CM | POA: Diagnosis not present

## 2021-06-29 DIAGNOSIS — Z124 Encounter for screening for malignant neoplasm of cervix: Secondary | ICD-10-CM | POA: Diagnosis not present

## 2021-06-30 ENCOUNTER — Other Ambulatory Visit (HOSPITAL_COMMUNITY): Payer: Self-pay

## 2021-07-20 ENCOUNTER — Other Ambulatory Visit: Payer: Self-pay | Admitting: Internal Medicine

## 2021-07-20 ENCOUNTER — Telehealth: Payer: Self-pay | Admitting: Medical Oncology

## 2021-07-20 DIAGNOSIS — C3431 Malignant neoplasm of lower lobe, right bronchus or lung: Secondary | ICD-10-CM

## 2021-07-20 MED ORDER — HYDROCOD POLST-CPM POLST ER 10-8 MG/5ML PO SUER: ORAL | 0 refills | Status: DC

## 2021-07-20 NOTE — Telephone Encounter (Signed)
Requested refill of Tussionex. When she lays down ,she starts coughing and Tussionex settles her cough so she can sleep.

## 2021-07-21 ENCOUNTER — Telehealth: Payer: Self-pay | Admitting: Medical Oncology

## 2021-07-21 NOTE — Telephone Encounter (Signed)
Tussionex price -per [facility] is $59. Son notified and will pick it up tomorrow

## 2021-07-26 ENCOUNTER — Inpatient Hospital Stay: Payer: Medicare Other | Attending: Internal Medicine | Admitting: Internal Medicine

## 2021-07-26 ENCOUNTER — Other Ambulatory Visit (HOSPITAL_COMMUNITY): Payer: Self-pay

## 2021-07-26 ENCOUNTER — Inpatient Hospital Stay: Payer: Medicare Other

## 2021-07-26 ENCOUNTER — Other Ambulatory Visit: Payer: Self-pay

## 2021-07-26 VITALS — BP 132/87 | HR 77 | Temp 97.5°F | Resp 18 | Ht <= 58 in | Wt 124.1 lb

## 2021-07-26 DIAGNOSIS — C3431 Malignant neoplasm of lower lobe, right bronchus or lung: Secondary | ICD-10-CM | POA: Diagnosis not present

## 2021-07-26 DIAGNOSIS — Z923 Personal history of irradiation: Secondary | ICD-10-CM | POA: Insufficient documentation

## 2021-07-26 DIAGNOSIS — Z79899 Other long term (current) drug therapy: Secondary | ICD-10-CM | POA: Insufficient documentation

## 2021-07-26 DIAGNOSIS — R21 Rash and other nonspecific skin eruption: Secondary | ICD-10-CM | POA: Insufficient documentation

## 2021-07-26 DIAGNOSIS — R059 Cough, unspecified: Secondary | ICD-10-CM | POA: Diagnosis not present

## 2021-07-26 DIAGNOSIS — Z5111 Encounter for antineoplastic chemotherapy: Secondary | ICD-10-CM

## 2021-07-26 DIAGNOSIS — C3490 Malignant neoplasm of unspecified part of unspecified bronchus or lung: Secondary | ICD-10-CM

## 2021-07-26 LAB — CMP (CANCER CENTER ONLY)
ALT: 20 U/L (ref 0–44)
AST: 24 U/L (ref 15–41)
Albumin: 4.1 g/dL (ref 3.5–5.0)
Alkaline Phosphatase: 87 U/L (ref 38–126)
Anion gap: 10 (ref 5–15)
BUN: 14 mg/dL (ref 6–20)
CO2: 29 mmol/L (ref 22–32)
Calcium: 9.3 mg/dL (ref 8.9–10.3)
Chloride: 104 mmol/L (ref 98–111)
Creatinine: 0.72 mg/dL (ref 0.44–1.00)
GFR, Estimated: >60 mL/min (ref >60)
Glucose, Bld: 104 mg/dL — ABNORMAL HIGH (ref 70–99)
Potassium: 4.5 mmol/L (ref 3.5–5.1)
Sodium: 143 mmol/L (ref 135–145)
Total Bilirubin: 0.7 mg/dL (ref 0.3–1.2)
Total Protein: 7.7 g/dL (ref 6.5–8.1)

## 2021-07-26 LAB — URINALYSIS, COMPLETE (UACMP) WITH MICROSCOPIC
Bilirubin Urine: NEGATIVE
Glucose, UA: NEGATIVE mg/dL
Hgb urine dipstick: NEGATIVE
Ketones, ur: NEGATIVE mg/dL
Nitrite: NEGATIVE
Protein, ur: NEGATIVE mg/dL
Specific Gravity, Urine: 1.011 (ref 1.005–1.030)
pH: 5 (ref 5.0–8.0)

## 2021-07-26 LAB — CBC WITH DIFFERENTIAL (CANCER CENTER ONLY)
Abs Immature Granulocytes: 0.02 10*3/uL (ref 0.00–0.07)
Basophils Absolute: 0 10*3/uL (ref 0.0–0.1)
Basophils Relative: 0 %
Eosinophils Absolute: 0.1 10*3/uL (ref 0.0–0.5)
Eosinophils Relative: 1 %
HCT: 39.3 % (ref 36.0–46.0)
Hemoglobin: 12.9 g/dL (ref 12.0–15.0)
Immature Granulocytes: 0 %
Lymphocytes Relative: 20 %
Lymphs Abs: 1.6 10*3/uL (ref 0.7–4.0)
MCH: 29.7 pg (ref 26.0–34.0)
MCHC: 32.8 g/dL (ref 30.0–36.0)
MCV: 90.6 fL (ref 80.0–100.0)
Monocytes Absolute: 0.6 10*3/uL (ref 0.1–1.0)
Monocytes Relative: 8 %
Neutro Abs: 5.6 10*3/uL (ref 1.7–7.7)
Neutrophils Relative %: 71 %
Platelet Count: 298 10*3/uL (ref 150–400)
RBC: 4.34 MIL/uL (ref 3.87–5.11)
RDW: 13.2 % (ref 11.5–15.5)
WBC Count: 7.9 10*3/uL (ref 4.0–10.5)
nRBC: 0 % (ref 0.0–0.2)

## 2021-07-26 NOTE — Progress Notes (Signed)
#        [place] [phone]   [phone]  OFFICE PROGRESS NOTE  System, Provider Not In No address on file  PRINCIPAL DIAGNOSIS: Local recurrence of non-small cell lung cancer, adenocarcinoma, initially diagnosed as stage IB (T2a N0 M0) in March of 2010.   PRIOR THERAPY:  Status post right lower lobectomy under the care of Dr. Cyndia Bent on 04/08/2009. Status post palliative radiotherapy to the right lower lobe recurrent lung mass under the care of Dr. Lisbeth Renshaw, completed on June 08, 2011. Systemic chemotherapy with carboplatin for AUC of 5 and Alimta 500 mg/M2 every 3 weeks. She is status post 3 cycles.  CURRENT THERAPY: Tarceva 150 mg by mouth daily, therapy beginning 07/24/2012. Status post approximately 99 months of therapy.   INTERVAL HISTORY: [name] 60 y.o. female returns to the clinic today for follow-up visit accompanied by her interpreter.  The patient is feeling fine today with no concerning complaints except for the dry cough and she received refill of her cough medication with Tussionex last week.  She denied having any current chest pain, shortness of breath or hemoptysis.  She denied having any fever or chills.  She has no weight loss or night sweats.  She has no nausea, vomiting, diarrhea or constipation.  She has no headache or visual changes.  She is here today for evaluation and repeat blood work.  MEDICAL HISTORY: Past Medical History:  Diagnosis Date   Drug-induced skin rash 02/27/2017   Encounter for antineoplastic chemotherapy 01/19/2016   History of radiation therapy 05/02/11 to 06/08/11   right lung   Lung cancer ([place])    right lower lobe adenocarcinoma    ALLERGIES:  is allergic to codeine and doxycycline.  MEDICATIONS:  Current Outpatient Medications  Medication Sig Dispense Refill   acetaminophen (TYLENOL) 500 MG tablet Take 500 mg every 6 (six) hours as needed by mouth.     albuterol (PROAIR HFA) 108 (90 Base) MCG/ACT  inhaler Inhale 1-2 puffs into the lungs every 6 (six) hours as needed for wheezing or shortness of breath. 1 Inhaler 2   chlorpheniramine-HYDROcodone (TUSSIONEX) 10-8 MG/5ML SUER TAKE 5 MLS BY MOUTH TWICE DAILY 280 mL 0   clindamycin (CLEOCIN T) 1 % lotion APPLY TO SKIN RASH TWICE DAILY. 60 mL 0   diltiazem (TIADYLT ER) 120 MG 24 hr capsule Take 1 capsule by mouth daily. 90 capsule 3   doxycycline (VIBRA-TABS) 100 MG tablet Take 1 tablet (100 mg total) by mouth 2 (two) times daily. 30 tablet 0   erlotinib (TARCEVA) 150 MG tablet Take 1 tablet (150 mg total) by mouth daily. Take on an empty stomach at least 1 hour before or 2 hours after food. 30 tablet 11   lidocaine-prilocaine (EMLA) cream Apply 1 application topically as needed. 30 g 0   No current facility-administered medications for this visit.    SURGICAL HISTORY:  Past Surgical History:  Procedure Laterality Date   LUNG LOBECTOMY  04/18/2009   RLL   PORTACATH PLACEMENT  02/29/2012   Procedure: INSERTION PORT-A-CATH;  Surgeon: Nicanor Alcon, MD;  Location: [place];  Service: Thoracic;  Laterality: Left;  PowerPort 8 F attachable in left internal jugular.    REVIEW OF SYSTEMS:  A comprehensive review of systems was negative except for: Respiratory: positive for cough   PHYSICAL EXAMINATION: General appearance: alert, cooperative, and no distress Head: Normocephalic, without obvious abnormality, atraumatic Neck: no adenopathy, no JVD, supple, symmetrical, trachea midline, and  thyroid not enlarged, symmetric, no tenderness/mass/nodules Lymph nodes: Cervical, supraclavicular, and axillary nodes normal. Resp: clear to auscultation bilaterally Back: symmetric, no curvature. ROM normal. No CVA tenderness. Cardio: regular rate and rhythm, S1, S2 normal, no murmur, click, rub or gallop GI: soft, non-tender; bowel sounds normal; no masses,  no organomegaly Extremities: extremities normal, atraumatic, no cyanosis or edema  ECOG PERFORMANCE  STATUS: 1 - Symptomatic but completely ambulatory  Blood pressure 132/87, pulse 77, temperature (!) 97.5 F (36.4 C), temperature source Tympanic, resp. rate 18, height 4\' 9"  (1.448 m), weight 124 lb 1.6 oz (56.3 kg), last menstrual period 02/10/2012, SpO2 100 %.  LABORATORY DATA: Lab Results  Component Value Date   WBC 7.9 07/26/2021   HGB 12.9 07/26/2021   HCT 39.3 07/26/2021   MCV 90.6 07/26/2021   PLT 298 07/26/2021      Chemistry      Component Value Date/Time   NA 140 05/17/2021 0942   NA 143 10/30/2017 1031   K 4.1 05/17/2021 0942   K 4.3 10/30/2017 1031   CL 104 05/17/2021 0942   CL 102 04/30/2013 1002   CO2 31 05/17/2021 0942   CO2 29 10/30/2017 1031   BUN 12 05/17/2021 0942   BUN 14.6 10/30/2017 1031   CREATININE 0.65 05/17/2021 0942   CREATININE 0.7 10/30/2017 1031      Component Value Date/Time   CALCIUM 9.0 05/17/2021 0942   CALCIUM 9.2 10/30/2017 1031   ALKPHOS 82 05/17/2021 0942   ALKPHOS 103 10/30/2017 1031   AST 28 05/17/2021 0942   AST 20 10/30/2017 1031   ALT 25 05/17/2021 0942   ALT 14 10/30/2017 1031   BILITOT 1.0 05/17/2021 0942   BILITOT 0.79 10/30/2017 1031       RADIOGRAPHIC STUDIES: No results found.   ASSESSMENT AND PLAN:  This is a very pleasant 60 years old Asian female with recurrent non-small cell lung cancer, adenocarcinoma status post surgical resection followed by systemic chemotherapy with carboplatin and Alimta followed by disease progression and the patient was started on treatment with Tarceva 150 mg by mouth daily status post 99 months  The patient has been tolerating this treatment well with no concerning adverse effects. Her CBC today is unremarkable.  Comprehensive metabolic panel is still pending. I recommended for her to continue her current treatment with Tarceva with the same dose. I will see her back for follow-up visit in 2 months for evaluation with repeat CT scan of the chest for restaging of her disease. For the  dry cough I will give her a refill of Tussionex last week. For the worsening skin rash she will continue to apply clindamycin lotion. The patient was advised to call immediately if she has any other concerning symptoms in the interval.  All questions were answered. The patient knows to call the clinic with any problems, questions or concerns. We can certainly see the patient much sooner if necessary.  Disclaimer: This note was dictated with voice recognition software. Similar sounding words can inadvertently be transcribed and may not be corrected upon review.

## 2021-08-26 ENCOUNTER — Telehealth: Payer: Self-pay | Admitting: Medical Oncology

## 2021-08-26 ENCOUNTER — Encounter: Payer: Self-pay | Admitting: Internal Medicine

## 2021-08-26 ENCOUNTER — Other Ambulatory Visit: Payer: Self-pay | Admitting: Internal Medicine

## 2021-08-26 ENCOUNTER — Other Ambulatory Visit (HOSPITAL_COMMUNITY): Payer: Self-pay

## 2021-08-26 ENCOUNTER — Telehealth: Payer: Self-pay

## 2021-08-26 DIAGNOSIS — C3431 Malignant neoplasm of lower lobe, right bronchus or lung: Secondary | ICD-10-CM

## 2021-08-26 MED ORDER — HYDROCOD POLST-CPM POLST ER 10-8 MG/5ML PO SUER
ORAL | 0 refills | Status: DC
  Filled 2021-08-26: qty 280, 28d supply, fill #0

## 2021-08-26 NOTE — Telephone Encounter (Signed)
Tussionex-Pt does not have any discount at [facility]. Son will call other pharmacies to get price and let us know where to send the rx.

## 2021-08-26 NOTE — Telephone Encounter (Signed)
Pts sone called to request a refill of chlorpheniramine-HYDROcodone (TUSSIONEX) 10-8 MG/5ML SUER

## 2021-08-27 ENCOUNTER — Other Ambulatory Visit (HOSPITAL_COMMUNITY): Payer: Self-pay

## 2021-08-27 ENCOUNTER — Encounter: Payer: Self-pay | Admitting: Internal Medicine

## 2021-08-27 ENCOUNTER — Telehealth: Payer: Self-pay | Admitting: Medical Oncology

## 2021-08-27 NOTE — Telephone Encounter (Signed)
Tussionex refill-new pharmacy- [facility] cannot offer her a discount for tussionex.    Please resend Tussionex refill to [facility] [address]

## 2021-08-30 ENCOUNTER — Other Ambulatory Visit: Payer: Self-pay | Admitting: Internal Medicine

## 2021-08-30 DIAGNOSIS — C3431 Malignant neoplasm of lower lobe, right bronchus or lung: Secondary | ICD-10-CM

## 2021-08-30 MED ORDER — HYDROCOD POLST-CPM POLST ER 10-8 MG/5ML PO SUER: 5.0000 mL | Freq: Two times a day (BID) | ORAL | 0 refills | Status: DC | PRN

## 2021-09-03 ENCOUNTER — Other Ambulatory Visit (HOSPITAL_COMMUNITY): Payer: Self-pay

## 2021-09-16 ENCOUNTER — Encounter (HOSPITAL_COMMUNITY): Payer: Self-pay | Admitting: Emergency Medicine

## 2021-09-16 ENCOUNTER — Emergency Department (HOSPITAL_COMMUNITY)
Admission: EM | Admit: 2021-09-16 | Discharge: 2021-09-17 | Disposition: A | Discharge status: Home / Self Care | Payer: Medicare Other | Attending: Emergency Medicine | Admitting: Emergency Medicine

## 2021-09-16 DIAGNOSIS — Z9221 Personal history of antineoplastic chemotherapy: Secondary | ICD-10-CM | POA: Insufficient documentation

## 2021-09-16 DIAGNOSIS — C3490 Malignant neoplasm of unspecified part of unspecified bronchus or lung: Secondary | ICD-10-CM | POA: Insufficient documentation

## 2021-09-16 DIAGNOSIS — I10 Essential (primary) hypertension: Secondary | ICD-10-CM | POA: Insufficient documentation

## 2021-09-16 DIAGNOSIS — Z7982 Long term (current) use of aspirin: Secondary | ICD-10-CM | POA: Insufficient documentation

## 2021-09-16 DIAGNOSIS — J069 Acute upper respiratory infection, unspecified: Secondary | ICD-10-CM | POA: Diagnosis not present

## 2021-09-16 DIAGNOSIS — R6883 Chills (without fever): Secondary | ICD-10-CM | POA: Insufficient documentation

## 2021-09-16 DIAGNOSIS — R0602 Shortness of breath: Secondary | ICD-10-CM | POA: Diagnosis not present

## 2021-09-16 DIAGNOSIS — J90 Pleural effusion, not elsewhere classified: Secondary | ICD-10-CM | POA: Diagnosis not present

## 2021-09-16 DIAGNOSIS — Z20822 Contact with and (suspected) exposure to covid-19: Secondary | ICD-10-CM | POA: Insufficient documentation

## 2021-09-16 NOTE — ED Triage Notes (Signed)
Cough, chills, hoarse voice x 3 days. Denies pain, n/v. Hx of lung cancer.

## 2021-09-17 ENCOUNTER — Emergency Department (HOSPITAL_COMMUNITY): Payer: Medicare Other

## 2021-09-17 DIAGNOSIS — J90 Pleural effusion, not elsewhere classified: Secondary | ICD-10-CM | POA: Diagnosis not present

## 2021-09-17 LAB — URINALYSIS, ROUTINE W REFLEX MICROSCOPIC
Bilirubin Urine: NEGATIVE
Glucose, UA: NEGATIVE mg/dL
Hgb urine dipstick: NEGATIVE
Ketones, ur: NEGATIVE mg/dL
Nitrite: NEGATIVE
Protein, ur: 30 mg/dL — AB
Specific Gravity, Urine: 1.023 (ref 1.005–1.030)
pH: 6 (ref 5.0–8.0)

## 2021-09-17 LAB — URINALYSIS, MICROSCOPIC (REFLEX): RBC / HPF: NONE SEEN RBC/hpf (ref 0–5)

## 2021-09-17 LAB — COMPREHENSIVE METABOLIC PANEL
ALT: 19 U/L (ref 0–44)
AST: 23 U/L (ref 15–41)
Albumin: 3.5 g/dL (ref 3.5–5.0)
Alkaline Phosphatase: 119 U/L (ref 38–126)
Anion gap: 10 (ref 5–15)
BUN: 7 mg/dL (ref 6–20)
CO2: 27 mmol/L (ref 22–32)
Calcium: 8.8 mg/dL — ABNORMAL LOW (ref 8.9–10.3)
Chloride: 99 mmol/L (ref 98–111)
Creatinine, Ser: 0.52 mg/dL (ref 0.44–1.00)
GFR, Estimated: >60 mL/min (ref >60)
Glucose, Bld: 193 mg/dL — ABNORMAL HIGH (ref 70–99)
Potassium: 2.8 mmol/L — ABNORMAL LOW (ref 3.5–5.1)
Sodium: 136 mmol/L (ref 135–145)
Total Bilirubin: 1.3 mg/dL — ABNORMAL HIGH (ref 0.3–1.2)
Total Protein: 7.9 g/dL (ref 6.5–8.1)

## 2021-09-17 LAB — CBC WITH DIFFERENTIAL/PLATELET
Abs Immature Granulocytes: 0.04 10*3/uL (ref 0.00–0.07)
Basophils Absolute: 0 10*3/uL (ref 0.0–0.1)
Basophils Relative: 0 %
Eosinophils Absolute: 0 10*3/uL (ref 0.0–0.5)
Eosinophils Relative: 0 %
HCT: 39.6 % (ref 36.0–46.0)
Hemoglobin: 12.8 g/dL (ref 12.0–15.0)
Immature Granulocytes: 0 %
Lymphocytes Relative: 7 %
Lymphs Abs: 0.9 10*3/uL (ref 0.7–4.0)
MCH: 28.8 pg (ref 26.0–34.0)
MCHC: 32.3 g/dL (ref 30.0–36.0)
MCV: 89 fL (ref 80.0–100.0)
Monocytes Absolute: 1.1 10*3/uL — ABNORMAL HIGH (ref 0.1–1.0)
Monocytes Relative: 9 %
Neutro Abs: 10 10*3/uL — ABNORMAL HIGH (ref 1.7–7.7)
Neutrophils Relative %: 84 %
Platelets: 358 10*3/uL (ref 150–400)
RBC: 4.45 MIL/uL (ref 3.87–5.11)
RDW: 13.3 % (ref 11.5–15.5)
WBC: 12.1 10*3/uL — ABNORMAL HIGH (ref 4.0–10.5)
nRBC: 0 % (ref 0.0–0.2)

## 2021-09-17 LAB — PROTIME-INR
INR: 1 (ref 0.8–1.2)
Prothrombin Time: 12.6 seconds (ref 11.4–15.2)

## 2021-09-17 LAB — I-STAT BETA HCG BLOOD, ED (MC, WL, AP ONLY): I-stat hCG, quantitative: 5.4 m[IU]/mL — ABNORMAL HIGH (ref <5)

## 2021-09-17 LAB — RESP PANEL BY RT-PCR (FLU A&B, COVID) ARPGX2
Influenza A by PCR: NEGATIVE
Influenza B by PCR: NEGATIVE
SARS Coronavirus 2 by RT PCR: NEGATIVE

## 2021-09-17 LAB — APTT: aPTT: 39 seconds — ABNORMAL HIGH (ref 24–36)

## 2021-09-17 LAB — LACTIC ACID, PLASMA
Lactic Acid, Venous: 1.2 mmol/L (ref 0.5–1.9)
Lactic Acid, Venous: 2.1 mmol/L (ref 0.5–1.9)

## 2021-09-17 MED ORDER — ALBUTEROL SULFATE HFA 108 (90 BASE) MCG/ACT IN AERS
2.0000 | INHALATION_SPRAY | RESPIRATORY_TRACT | Status: DC | PRN
  Administered 2021-09-17: 2 via RESPIRATORY_TRACT
  Filled 2021-09-17: qty 6.7

## 2021-09-17 MED ORDER — HYDROCOD POLST-CPM POLST ER 10-8 MG/5ML PO SUER
5.0000 mL | Freq: Once | ORAL | Status: AC
  Administered 2021-09-17: 5 mL via ORAL
  Filled 2021-09-17: qty 5

## 2021-09-17 MED ORDER — AZITHROMYCIN 250 MG PO TABS
500.0000 mg | ORAL_TABLET | Freq: Once | ORAL | Status: AC
  Administered 2021-09-17: 500 mg via ORAL
  Filled 2021-09-17: qty 2

## 2021-09-17 MED ORDER — CEFDINIR 300 MG PO CAPS: 300.0000 mg | ORAL_CAPSULE | Freq: Two times a day (BID) | ORAL | 0 refills | Status: DC

## 2021-09-17 MED ORDER — SODIUM CHLORIDE 0.9 % IV SOLN
1.0000 g | Freq: Once | INTRAVENOUS | Status: AC
  Administered 2021-09-17: 1 g via INTRAVENOUS
  Filled 2021-09-17: qty 10

## 2021-09-17 MED ORDER — PREDNISONE 20 MG PO TABS: 40.0000 mg | ORAL_TABLET | Freq: Every day | ORAL | 0 refills | Status: DC

## 2021-09-17 MED ORDER — AZITHROMYCIN 250 MG PO TABS: 250.0000 mg | ORAL_TABLET | Freq: Every day | ORAL | 0 refills | Status: DC

## 2021-09-17 MED ORDER — LACTATED RINGERS IV BOLUS (SEPSIS)
1000.0000 mL | Freq: Once | INTRAVENOUS | Status: AC
  Administered 2021-09-17: 1000 mL via INTRAVENOUS

## 2021-09-17 NOTE — ED Notes (Signed)
Patient ambulated to bathroom w/o assistance

## 2021-09-17 NOTE — ED Provider Notes (Signed)
[place] DEPT Provider Note   CSN: [id number] Arrival date & time: 09/16/21  2319     History Chief Complaint  Patient presents with   Cough   Chills    [name] is a 60 y.o. female.  Patient presents to the emergency department for evaluation of cough.  Patient reports that she has been feeling poorly for 3 days.  Patient has a history of lung cancer, currently on oral chemotherapy.  She reports a chronic cough secondary to her lung cancer but for the last 3 days cough has been worse.  She has coughing fits that cause her to be short of breath.  She has felt malaise and intermittent chills.  She has been taking Tylenol for the chills, has not documented any fevers.  Patient reports that she has had some loose stools but this is likely secondary to her chemotherapy.  No nausea or vomiting.      Past Medical History:  Diagnosis Date   Drug-induced skin rash 02/27/2017   Encounter for antineoplastic chemotherapy 01/19/2016   History of radiation therapy 05/02/11 to 06/08/11   right lung   Lung cancer [facility])    right lower lobe adenocarcinoma    Patient Active Problem List   Diagnosis Date Noted   Otitis media 03/13/2018   Seasonal allergic rhinitis due to pollen 06/15/2017   Essential hypertension 05/23/2017   Dyspnea 05/21/2017   Acute upper respiratory infection 05/21/2017   Atrial arrhythmia 05/21/2017   Drug-induced skin rash 02/27/2017   Port catheter in place 12/27/2016   Encounter for antineoplastic chemotherapy 06/13/2016   GERD (gastroesophageal reflux disease) 04/18/2016   Primary cancer of right lower lobe of lung ([place]) 05/06/2009   LEIOMYOMA, UTERUS 05/06/2009    Past Surgical History:  Procedure Laterality Date   LUNG LOBECTOMY  04/18/2009   RLL   PORTACATH PLACEMENT  02/29/2012   Procedure: INSERTION PORT-A-CATH;  Surgeon: Nicanor Alcon, MD;  Location: [place];  Service: Thoracic;  Laterality: Left;  PowerPort 8 F attachable in  left internal jugular.     OB History   No obstetric history on file.     Family History  Problem Relation Age of Onset   Heart Problems Mother    Cancer Neg Hx     Social History   Tobacco Use   Smoking status: Never   Smokeless tobacco: Never  Vaping Use   Vaping Use: Never used  Substance Use Topics   Alcohol use: No   Drug use: No    Home Medications Prior to Admission medications   Medication Sig Start Date End Date Taking? Authorizing Provider  albuterol (PROAIR HFA) 108 (90 Base) MCG/ACT inhaler Inhale 1-2 puffs into the lungs every 6 (six) hours as needed for wheezing or shortness of breath. 12/24/18  Yes Curt Bears, MD  aspirin EC 81 MG tablet Take 81 mg by mouth daily. Swallow whole.   Yes [provider]  chlorpheniramine-HYDROcodone (TUSSIONEX) 10-8 MG/5ML SUER Take 5 mLs by mouth every 12 (twelve) hours as needed for cough. 08/30/21 02/26/22 Yes Curt Bears, MD  diltiazem (TIADYLT ER) 120 MG 24 hr capsule Take 1 capsule by mouth daily. 06/21/21  Yes Shirley Friar, PA-C  erlotinib (TARCEVA) 150 MG tablet Take 1 tablet (150 mg total) by mouth daily. Take on an empty stomach at least 1 hour before or 2 hours after food. 12/21/20  Yes Curt Bears, MD  acetaminophen (TYLENOL) 500 MG tablet Take 500 mg every  6 (six) hours as needed by mouth.    [provider]  clindamycin (CLEOCIN T) 1 % lotion APPLY TO SKIN RASH TWICE DAILY. Patient not taking: No sig reported 12/07/20 12/07/21  Curt Bears, MD  doxycycline (VIBRA-TABS) 100 MG tablet Take 1 tablet (100 mg total) by mouth 2 (two) times daily. Patient not taking: No sig reported 05/24/21   Curt Bears, MD  lidocaine-prilocaine (EMLA) cream Apply 1 application topically as needed. Patient not taking: No sig reported 04/30/18   Curt Bears, MD    Allergies    Codeine and Doxycycline  Review of Systems   Review of Systems  Respiratory:  Positive for cough and  shortness of breath.   All other systems reviewed and are negative.  Physical Exam Updated Vital Signs BP 126/87   Pulse 97   Temp 99.6 F (37.6 C) (Oral) Comment: 1g tylenol at 17:00  Resp (!) 33   Ht 4\' 9"  (1.448 m)   Wt 56.2 kg   LMP 02/10/2012   SpO2 95%   BMI 26.83 kg/m   Physical Exam Vitals and nursing note reviewed.  Constitutional:      General: She is not in acute distress.    Appearance: Normal appearance. She is well-developed.  HENT:     Head: Normocephalic and atraumatic.     Right Ear: Hearing normal.     Left Ear: Hearing normal.     Nose: Nose normal.  Eyes:     Conjunctiva/sclera: Conjunctivae normal.     Pupils: Pupils are equal, round, and reactive to light.  Cardiovascular:     Rate and Rhythm: Regular rhythm.     Heart sounds: S1 normal and S2 normal. No murmur heard.   No friction rub. No gallop.  Pulmonary:     Effort: Pulmonary effort is normal. No respiratory distress.     Breath sounds: Examination of the right-lower field reveals decreased breath sounds. Decreased breath sounds present.  Chest:     Chest wall: No tenderness.  Abdominal:     General: Bowel sounds are normal.     Palpations: Abdomen is soft.     Tenderness: There is no abdominal tenderness. There is no guarding or rebound. Negative signs include Murphy's sign and McBurney's sign.     Hernia: No hernia is present.  Musculoskeletal:        General: Normal range of motion.     Cervical back: Normal range of motion and neck supple.  Skin:    General: Skin is warm and dry.     Findings: No rash.  Neurological:     Mental Status: She is alert and oriented to person, place, and time.     GCS: GCS eye subscore is 4. GCS verbal subscore is 5. GCS motor subscore is 6.     Cranial Nerves: No cranial nerve deficit.     Sensory: No sensory deficit.     Coordination: Coordination normal.  Psychiatric:        Speech: Speech normal.        Behavior: Behavior normal.        Thought  Content: Thought content normal.    ED Results / Procedures / Treatments   Labs (all labs ordered are listed, but only abnormal results are displayed) Labs Reviewed  LACTIC ACID, PLASMA - Abnormal; Notable for the following components:      Result Value   Lactic Acid, Venous 2.1 (*)    All other components within normal limits  COMPREHENSIVE METABOLIC PANEL - Abnormal; Notable for the following components:   Potassium 2.8 (*)    Glucose, Bld 193 (*)    Calcium 8.8 (*)    Total Bilirubin 1.3 (*)    All other components within normal limits  CBC WITH DIFFERENTIAL/PLATELET - Abnormal; Notable for the following components:   WBC 12.1 (*)    Neutro Abs 10.0 (*)    Monocytes Absolute 1.1 (*)    All other components within normal limits  APTT - Abnormal; Notable for the following components:   aPTT 39 (*)    All other components within normal limits  URINALYSIS, ROUTINE W REFLEX MICROSCOPIC - Abnormal; Notable for the following components:   Protein, ur 30 (*)    Leukocytes,Ua MODERATE (*)    All other components within normal limits  URINALYSIS, MICROSCOPIC (REFLEX) - Abnormal; Notable for the following components:   Bacteria, UA FEW (*)    All other components within normal limits  I-STAT BETA HCG BLOOD, ED ([facility], [facility], [facility] ONLY) - Abnormal; Notable for the following components:   I-stat hCG, quantitative 5.4 (*)    All other components within normal limits  RESP PANEL BY RT-PCR (FLU A&B, COVID) ARPGX2  CULTURE, BLOOD (SINGLE)  URINE CULTURE  LACTIC ACID, PLASMA  PROTIME-INR    EKG EKG Interpretation  Date/Time:  Friday September 17 2021 00:23:58 EDT Ventricular Rate:  98 PR Interval:  122 QRS Duration: 84 QT Interval:  346 QTC Calculation: 442 R Axis:   48 Text Interpretation: Sinus rhythm Borderline T wave abnormalities Confirmed by Orpah Greek [phone]) on 09/17/2021 1:09:26 AM  Radiology DG Chest Port 1 View  Result Date: 09/17/2021 CLINICAL DATA:   Questionable sepsis, evaluate for abnormality. EXAM: PORTABLE CHEST 1 VIEW COMPARISON:  01/15/2019 chest radiograph, correlation is also made with 05/17/2021 CT chest FINDINGS: Left chest port with catheter tip approximating the right atrium. Unchanged cardiac and mediastinal contours, within normal. Unchanged opacity at the right lung base, with blunting the right costophrenic angle and cardiophrenic angle, likely related to prior right lower lobectomy and known pleural effusion with pleural thickening, better evaluated on the 05/17/2021 CT. No new focal pulmonary opacity. IMPRESSION: Redemonstrated right pleural thickening and effusion, not significantly changed since 01/15/2019. No new focal pulmonary opacity. Electronically Signed   By: Merilyn Baba M.D.   On: 09/17/2021 00:21    Procedures Procedures   Medications Ordered in ED Medications  albuterol (VENTOLIN HFA) 108 (90 Base) MCG/ACT inhaler 2 puff (has no administration in time range)  lactated ringers bolus 1,000 mL (0 mLs Intravenous Stopped 09/17/21 0311)  chlorpheniramine-HYDROcodone (TUSSIONEX) 10-8 MG/5ML suspension 5 mL (5 mLs Oral Given 09/17/21 0215)  cefTRIAXone (ROCEPHIN) 1 g in sodium chloride 0.9 % 100 mL IVPB (0 g Intravenous Stopped 09/17/21 0352)  azithromycin (ZITHROMAX) tablet 500 mg (500 mg Oral Given 09/17/21 0310)    ED Course  I have reviewed the triage vital signs and the nursing notes.  Pertinent labs & imaging results that were available during my care of the patient were reviewed by me and considered in my medical decision making (see chart for details).    MDM Rules/Calculators/A&P                           Patient presents to the emergency department for evaluation of cough.  Patient reports a history of lung cancer.  She has had increased cough for 3 days.  Patient's oxygen saturations  are normal.  She did have decreased breath sounds at the right base.  This coincides with effusion on the right side  that is unchanged from prior.  Work-up was otherwise unremarkable.  We will treat empirically with antibiotics to cover for community-acquired pneumonia.  Treat for bronchospasm and inflammation.  Follow-up with PCP and/or oncologist this week for recheck.  Return if symptoms worsen.  Final Clinical Impression(s) / ED Diagnoses Final diagnoses:  Upper respiratory tract infection, unspecified type    Rx / DC Orders ED Discharge Orders     None        Rubens Cranston, Gwenyth Allegra, MD 09/17/21 [phone]

## 2021-09-19 LAB — URINE CULTURE: Culture: 50000 — AB

## 2021-09-20 ENCOUNTER — Other Ambulatory Visit: Payer: Medicare Other

## 2021-09-20 ENCOUNTER — Telehealth: Payer: Self-pay | Admitting: *Deleted

## 2021-09-20 NOTE — Telephone Encounter (Signed)
Post ED Visit - Positive Culture Follow-up  Culture report reviewed by antimicrobial stewardship pharmacist: [place] Team []  Elenor Quinones, Pharm.D. []  Heide Guile, Pharm.D., BCPS AQ-ID []  Parks Neptune, Pharm.D., BCPS []  Alycia Rossetti, Pharm.D., BCPS []  Portal, Pharm.D., BCPS, AAHIVP []  Legrand Como, Pharm.D., BCPS, AAHIVP []  Salome Arnt, PharmD, BCPS []  Johnnette Gourd, PharmD, BCPS []  Hughes Better, PharmD, BCPS []  Leeroy Cha, PharmD []  Laqueta Linden, PharmD, BCPS []  Albertina Parr, PharmD  [place] Team []  Leodis Sias, PharmD []  Lindell Spar, PharmD []  Royetta Asal, PharmD []  Graylin Shiver, Rph []  Rema Fendt) Glennon Mac, PharmD []  Arlyn Dunning, PharmD []  Netta Cedars, PharmD []  Dia Sitter, PharmD []  Leone Haven, PharmD []  Gretta Arab, PharmD []  Theodis Shove, PharmD []  Peggyann Juba, PharmD []  Reuel Boom, PharmD   Positive urine culture Treated with Azithromycn/Cefdinir, organism sensitive to the same and no further patient follow-up is required at this time.  Onnie Boer, PharmD  Harlon Flor Talley 09/20/2021, 10:09 AM

## 2021-09-21 ENCOUNTER — Ambulatory Visit (HOSPITAL_COMMUNITY)
Admission: RE | Admit: 2021-09-21 | Discharge: 2021-09-21 | Disposition: A | Discharge status: Home / Self Care | Payer: Medicare Other | Source: Ambulatory Visit | Attending: Internal Medicine | Admitting: Internal Medicine

## 2021-09-21 ENCOUNTER — Other Ambulatory Visit: Payer: Self-pay

## 2021-09-21 ENCOUNTER — Inpatient Hospital Stay: Payer: Medicare Other | Attending: Internal Medicine

## 2021-09-21 ENCOUNTER — Encounter (HOSPITAL_COMMUNITY): Payer: Self-pay

## 2021-09-21 DIAGNOSIS — C3431 Malignant neoplasm of lower lobe, right bronchus or lung: Secondary | ICD-10-CM | POA: Insufficient documentation

## 2021-09-21 DIAGNOSIS — R911 Solitary pulmonary nodule: Secondary | ICD-10-CM | POA: Diagnosis not present

## 2021-09-21 DIAGNOSIS — C3490 Malignant neoplasm of unspecified part of unspecified bronchus or lung: Secondary | ICD-10-CM | POA: Insufficient documentation

## 2021-09-21 DIAGNOSIS — Z23 Encounter for immunization: Secondary | ICD-10-CM | POA: Insufficient documentation

## 2021-09-21 LAB — CBC WITH DIFFERENTIAL (CANCER CENTER ONLY)
Abs Immature Granulocytes: 0.07 10*3/uL (ref 0.00–0.07)
Basophils Absolute: 0 10*3/uL (ref 0.0–0.1)
Basophils Relative: 0 %
Eosinophils Absolute: 0.3 10*3/uL (ref 0.0–0.5)
Eosinophils Relative: 2 %
HCT: 43 % (ref 36.0–46.0)
Hemoglobin: 14 g/dL (ref 12.0–15.0)
Immature Granulocytes: 0 %
Lymphocytes Relative: 14 %
Lymphs Abs: 2.2 10*3/uL (ref 0.7–4.0)
MCH: 28.3 pg (ref 26.0–34.0)
MCHC: 32.6 g/dL (ref 30.0–36.0)
MCV: 86.9 fL (ref 80.0–100.0)
Monocytes Absolute: 1.1 10*3/uL — ABNORMAL HIGH (ref 0.1–1.0)
Monocytes Relative: 7 %
Neutro Abs: 12.3 10*3/uL — ABNORMAL HIGH (ref 1.7–7.7)
Neutrophils Relative %: 77 %
Platelet Count: 506 10*3/uL — ABNORMAL HIGH (ref 150–400)
RBC: 4.95 MIL/uL (ref 3.87–5.11)
RDW: 13.1 % (ref 11.5–15.5)
WBC Count: 16 10*3/uL — ABNORMAL HIGH (ref 4.0–10.5)
nRBC: 0 % (ref 0.0–0.2)

## 2021-09-21 LAB — CMP (CANCER CENTER ONLY)
ALT: 30 U/L (ref 0–44)
AST: 27 U/L (ref 15–41)
Albumin: 3.1 g/dL — ABNORMAL LOW (ref 3.5–5.0)
Alkaline Phosphatase: 116 U/L (ref 38–126)
Anion gap: 9 (ref 5–15)
BUN: 9 mg/dL (ref 6–20)
CO2: 32 mmol/L (ref 22–32)
Calcium: 9 mg/dL (ref 8.9–10.3)
Chloride: 101 mmol/L (ref 98–111)
Creatinine: 0.64 mg/dL (ref 0.44–1.00)
GFR, Estimated: >60 mL/min (ref >60)
Glucose, Bld: 101 mg/dL — ABNORMAL HIGH (ref 70–99)
Potassium: 3.5 mmol/L (ref 3.5–5.1)
Sodium: 142 mmol/L (ref 135–145)
Total Bilirubin: 0.4 mg/dL (ref 0.3–1.2)
Total Protein: 7.3 g/dL (ref 6.5–8.1)

## 2021-09-21 MED ORDER — HEPARIN SOD (PORK) LOCK FLUSH 100 UNIT/ML IV SOLN
INTRAVENOUS | Status: AC
  Administered 2021-09-21: 500 [IU] via INTRAVENOUS
  Filled 2021-09-21: qty 5

## 2021-09-21 MED ORDER — HEPARIN SOD (PORK) LOCK FLUSH 100 UNIT/ML IV SOLN: 500.0000 [IU] | Freq: Once | INTRAVENOUS | Status: AC

## 2021-09-21 MED ORDER — IOHEXOL 350 MG/ML SOLN
75.0000 mL | Freq: Once | INTRAVENOUS | Status: AC | PRN
  Administered 2021-09-21: 60 mL via INTRAVENOUS

## 2021-09-22 LAB — CULTURE, BLOOD (SINGLE)
Culture: NO GROWTH
Special Requests: ADEQUATE

## 2021-09-27 ENCOUNTER — Encounter: Payer: Self-pay | Admitting: Internal Medicine

## 2021-09-27 ENCOUNTER — Other Ambulatory Visit: Payer: Self-pay

## 2021-09-27 ENCOUNTER — Other Ambulatory Visit (HOSPITAL_COMMUNITY): Payer: Self-pay

## 2021-09-27 ENCOUNTER — Inpatient Hospital Stay: Payer: Medicare Other

## 2021-09-27 ENCOUNTER — Inpatient Hospital Stay (HOSPITAL_BASED_OUTPATIENT_CLINIC_OR_DEPARTMENT_OTHER): Payer: Medicare Other | Admitting: Internal Medicine

## 2021-09-27 DIAGNOSIS — Z23 Encounter for immunization: Secondary | ICD-10-CM | POA: Diagnosis not present

## 2021-09-27 DIAGNOSIS — C3431 Malignant neoplasm of lower lobe, right bronchus or lung: Secondary | ICD-10-CM

## 2021-09-27 MED ORDER — INFLUENZA VAC SPLIT QUAD 0.5 ML IM SUSY
0.5000 mL | PREFILLED_SYRINGE | Freq: Once | INTRAMUSCULAR | Status: AC
  Administered 2021-09-27: 0.5 mL via INTRAMUSCULAR
  Filled 2021-09-27: qty 0.5

## 2021-09-27 MED ORDER — HYDROCOD POLST-CPM POLST ER 10-8 MG/5ML PO SUER: 5.0000 mL | Freq: Two times a day (BID) | ORAL | 0 refills | Status: DC | PRN

## 2021-09-27 NOTE — Progress Notes (Signed)
#        [place] [phone]   [phone]  OFFICE PROGRESS NOTE  System, Provider Not In No address on file  PRINCIPAL DIAGNOSIS: Local recurrence of non-small cell lung cancer, adenocarcinoma, initially diagnosed as stage IB (T2a N0 M0) in March of 2010.   PRIOR THERAPY:  Status post right lower lobectomy under the care of Dr. Cyndia Bent on 04/08/2009. Status post palliative radiotherapy to the right lower lobe recurrent lung mass under the care of Dr. Lisbeth Renshaw, completed on June 08, 2011. Systemic chemotherapy with carboplatin for AUC of 5 and Alimta 500 mg/M2 every 3 weeks. She is status post 3 cycles.  CURRENT THERAPY: Tarceva 150 mg by mouth daily, therapy beginning 07/24/2012. Status post approximately 101 months of therapy.   INTERVAL HISTORY: [name] 60 y.o. female returns to the clinic today for follow-up visit.  Her [place] interpreter was available by audio today.  The patient was seen at the emergency department 10 days ago complaining of worsening cough as well as shortness of breath and chills.  She was tested for respiratory viruses and these were negative.  She denied having any current chest pain but has dry cough with mild shortness of breath and no hemoptysis.  She denied having any current nausea, vomiting, diarrhea or constipation.  She has no headache or visual changes.  She has no recent weight loss or night sweats.  She had repeat CT scan of the chest performed recently and she is here for evaluation and discussion of her scan results and treatment options.   MEDICAL HISTORY: Past Medical History:  Diagnosis Date   Drug-induced skin rash 02/27/2017   Encounter for antineoplastic chemotherapy 01/19/2016   History of radiation therapy 05/02/11 to 06/08/11   right lung   Lung cancer ([facility])    right lower lobe adenocarcinoma    ALLERGIES:  is allergic to codeine and doxycycline.  MEDICATIONS:  Current Outpatient Medications   Medication Sig Dispense Refill   acetaminophen (TYLENOL) 500 MG tablet Take 500 mg every 6 (six) hours as needed by mouth.     albuterol (PROAIR HFA) 108 (90 Base) MCG/ACT inhaler Inhale 1-2 puffs into the lungs every 6 (six) hours as needed for wheezing or shortness of breath. 1 Inhaler 2   aspirin EC 81 MG tablet Take 81 mg by mouth daily. Swallow whole.     azithromycin (ZITHROMAX Z-PAK) 250 MG tablet Take 1 tablet (250 mg total) by mouth daily. 4 tablet 0   cefdinir (OMNICEF) 300 MG capsule Take 1 capsule (300 mg total) by mouth 2 (two) times daily. 20 capsule 0   chlorpheniramine-HYDROcodone (TUSSIONEX) 10-8 MG/5ML SUER Take 5 mLs by mouth every 12 (twelve) hours as needed for cough. 280 mL 0   clindamycin (CLEOCIN T) 1 % lotion APPLY TO SKIN RASH TWICE DAILY. (Patient not taking: No sig reported) 60 mL 0   diltiazem (TIADYLT ER) 120 MG 24 hr capsule Take 1 capsule by mouth daily. 90 capsule 3   doxycycline (VIBRA-TABS) 100 MG tablet Take 1 tablet (100 mg total) by mouth 2 (two) times daily. (Patient not taking: No sig reported) 30 tablet 0   erlotinib (TARCEVA) 150 MG tablet Take 1 tablet (150 mg total) by mouth daily. Take on an empty stomach at least 1 hour before or 2 hours after food. 30 tablet 11   lidocaine-prilocaine (EMLA) cream Apply 1 application topically as needed. (Patient not taking: No sig reported) 30 g 0  predniSONE (DELTASONE) 20 MG tablet Take 2 tablets (40 mg total) by mouth daily with breakfast. 10 tablet 0   No current facility-administered medications for this visit.    SURGICAL HISTORY:  Past Surgical History:  Procedure Laterality Date   LUNG LOBECTOMY  04/18/2009   RLL   PORTACATH PLACEMENT  02/29/2012   Procedure: INSERTION PORT-A-CATH;  Surgeon: Nicanor Alcon, MD;  Location: [facility];  Service: Thoracic;  Laterality: Left;  PowerPort 8 F attachable in left internal jugular.    REVIEW OF SYSTEMS:  Constitutional: positive for fatigue Eyes: negative Ears,  nose, mouth, throat, and face: negative Respiratory: positive for cough and dyspnea on exertion Cardiovascular: negative Gastrointestinal: negative Genitourinary:negative Integument/breast: negative Hematologic/lymphatic: negative Musculoskeletal:negative Neurological: negative Behavioral/Psych: negative Endocrine: negative Allergic/Immunologic: negative   PHYSICAL EXAMINATION: General appearance: alert, cooperative, fatigued, and no distress Head: Normocephalic, without obvious abnormality, atraumatic Neck: no adenopathy, no JVD, supple, symmetrical, trachea midline, and thyroid not enlarged, symmetric, no tenderness/mass/nodules Lymph nodes: Cervical, supraclavicular, and axillary nodes normal. Resp: clear to auscultation bilaterally Back: symmetric, no curvature. ROM normal. No CVA tenderness. Cardio: regular rate and rhythm, S1, S2 normal, no murmur, click, rub or gallop GI: soft, non-tender; bowel sounds normal; no masses,  no organomegaly Extremities: extremities normal, atraumatic, no cyanosis or edema Neurologic: Alert and oriented X 3, normal strength and tone. Normal symmetric reflexes. Normal coordination and gait  ECOG PERFORMANCE STATUS: 1 - Symptomatic but completely ambulatory  Blood pressure 107/79, pulse 70, temperature 98.4 F (36.9 C), temperature source Tympanic, resp. rate 18, height 4\' 9"  (1.448 m), weight 118 lb 9.6 oz (53.8 kg), last menstrual period 02/10/2012, SpO2 98 %.  LABORATORY DATA: Lab Results  Component Value Date   WBC 16.0 (H) 09/21/2021   HGB 14.0 09/21/2021   HCT 43.0 09/21/2021   MCV 86.9 09/21/2021   PLT 506 (H) 09/21/2021      Chemistry      Component Value Date/Time   NA 142 09/21/2021 0734   NA 143 10/30/2017 1031   K 3.5 09/21/2021 0734   K 4.3 10/30/2017 1031   CL 101 09/21/2021 0734   CL 102 04/30/2013 1002   CO2 32 09/21/2021 0734   CO2 29 10/30/2017 1031   BUN 9 09/21/2021 0734   BUN 14.6 10/30/2017 1031   CREATININE  0.64 09/21/2021 0734   CREATININE 0.7 10/30/2017 1031      Component Value Date/Time   CALCIUM 9.0 09/21/2021 0734   CALCIUM 9.2 10/30/2017 1031   ALKPHOS 116 09/21/2021 0734   ALKPHOS 103 10/30/2017 1031   AST 27 09/21/2021 0734   AST 20 10/30/2017 1031   ALT 30 09/21/2021 0734   ALT 14 10/30/2017 1031   BILITOT 0.4 09/21/2021 0734   BILITOT 0.79 10/30/2017 1031       RADIOGRAPHIC STUDIES: CT Chest W Contrast  Result Date: 09/22/2021 CLINICAL DATA:  Primary Cancer Type: Lung Imaging Indication: Assess response to therapy Interval therapy since last imaging? Yes Initial Cancer Diagnosis Date: 12/12/2008; Established by: Biopsy-proven Detailed Pathology: Stage IB non-small cell lung cancer, adenocarcinoma. Primary Tumor location:  Right lower lobe. Recurrence? Yes; Date(s) of recurrence: 2012; Established by: Biopsy-proven Surgeries: Right lower lobectomy 04/08/2009. Chemotherapy: Yes; Ongoing?  Yes; Tarceva daily Immunotherapy? No Radiation therapy? Yes Date Range: 03/23/2016-03/31/2016; Target: Right lung. Date Range: 2012; Target: Right lung. EXAM: CT CHEST WITH CONTRAST TECHNIQUE: Multidetector CT imaging of the chest was performed during intravenous contrast administration. CONTRAST:  45mL OMNIPAQUE IOHEXOL 350 MG/ML SOLN COMPARISON:  Most recent CT chest 05/17/2021.  01/16/2012 PET-CT. FINDINGS: Cardiovascular: LEFT-sided Port-A-Cath terminates at the caval to atrial junction. Heart size stable. Signs of pericardial thickening along the RIGHT heart border are similar grossly to previous imaging. Calcified and noncalcified atheromatous plaque of the thoracic aorta without aneurysmal dilation. Central pulmonary vasculature is unremarkable aside from postoperative changes at the RIGHT hilum. Mediastinum/Nodes: RIGHT thyroid nodule (image 10/2) 16 mm. Similar to previous imaging. No discrete adenopathy in the mediastinum. Esophagus patulous. No hilar adenopathy. Lungs/Pleura: Post RIGHT lower  lobectomy. Pleural thickening and small amount of pleural fluid in the RIGHT chest with similar appearance to previous imaging. Nodular area in the mid chest adjacent to post treatment changes about the RIGHT hilum measuring 18 x 14 mm. This was less distinct on the previous exam measuring 21 x 19 mm. Nodular area along the RIGHT hilum (image 68) 19 x 18 mm, previously ground-glass now with more solid appearance. LEFT chest is clear. Airways are patent. Consolidative changes in the medial lower RIGHT chest with central fluid attenuation measuring 3 x 1.9 cm previously 3.5 x 3.1 cm. Mixed density in this area persists. Upper Abdomen: Hepatic steatosis. No visible hepatic lesion. Liver incompletely imaged. No acute findings relative to pancreas, adrenal glands or kidneys. Adrenal glands without nodule or mass. Musculoskeletal: No acute musculoskeletal process. Post treatment related changes along the RIGHT posterior thorax involving RIGHT posterior ribs as before IMPRESSION: Consolidation/increased density of treated areas in the RIGHT upper lobe along the superior margin of the RIGHT hilum and medial RIGHT chest, increased density with diminished or stable size, attention on follow-up. Correlation with timing of radiotherapy may be helpful. Increasing density suggested progressively over a series of prior studies. Short interval follow-up or PET could be considered based on appearance. No additional change. No additional findings to suggest new or progressive process. Electronically Signed   By: Zetta Bills M.D.   On: 09/22/2021 13:05   DG Chest Port 1 View  Result Date: 09/17/2021 CLINICAL DATA:  Questionable sepsis, evaluate for abnormality. EXAM: PORTABLE CHEST 1 VIEW COMPARISON:  01/15/2019 chest radiograph, correlation is also made with 05/17/2021 CT chest FINDINGS: Left chest port with catheter tip approximating the right atrium. Unchanged cardiac and mediastinal contours, within normal. Unchanged  opacity at the right lung base, with blunting the right costophrenic angle and cardiophrenic angle, likely related to prior right lower lobectomy and known pleural effusion with pleural thickening, better evaluated on the 05/17/2021 CT. No new focal pulmonary opacity. IMPRESSION: Redemonstrated right pleural thickening and effusion, not significantly changed since 01/15/2019. No new focal pulmonary opacity. Electronically Signed   By: Merilyn Baba M.D.   On: 09/17/2021 00:21     ASSESSMENT AND PLAN:  This is a very pleasant 60 years old Asian female with recurrent non-small cell lung cancer, adenocarcinoma status post surgical resection followed by systemic chemotherapy with carboplatin and Alimta followed by disease progression and the patient was started on treatment with Tarceva 150 mg by mouth daily status post 101 months  The patient has been tolerating her treatment well with no concerning adverse effects. She was seen recently at the emergency department for treatment of bronchitis and she was treated with Z-Pak as well as prednisone. The patient had repeat CT scan of the chest performed recently.  I personally and independently reviewed the scan images and discussed the results with the patient today. Her scan showed no clear evidence for disease progression but there was increased in the density of the  consolidation of the right upper lobe. I recommended for the patient to continue her current treatment with Tarceva with the same dose. I will see her back for follow-up visit in 1 months for evaluation and repeat blood work. If she has worsening of her respiratory symptoms, I will refer the patient to pulmonary medicine for consideration of repeat bronchoscopy to rule out any other central or obstructive mass. For the dry cough I will give her a refill of Tussionex. For the worsening skin rash she will continue to apply clindamycin lotion. The patient was advised to call immediately if she has  any other concerning symptoms in the interval.  All questions were answered. The patient knows to call the clinic with any problems, questions or concerns. We can certainly see the patient much sooner if necessary.  Disclaimer: This note was dictated with voice recognition software. Similar sounding words can inadvertently be transcribed and may not be corrected upon review.

## 2021-11-04 DIAGNOSIS — Z20822 Contact with and (suspected) exposure to covid-19: Secondary | ICD-10-CM | POA: Diagnosis not present

## 2021-11-16 ENCOUNTER — Telehealth: Payer: Self-pay | Admitting: Internal Medicine

## 2021-11-16 MED ORDER — DILTIAZEM HCL ER BEADS 120 MG PO CP24: 120.0000 mg | ORAL_CAPSULE | Freq: Every day | ORAL | 2 refills | Status: DC

## 2021-11-16 NOTE — Telephone Encounter (Signed)
°*  STAT* If patient is at the pharmacy, call can be transferred to refill team.   1. Which medications need to be refilled? (please list name of each medication and dose if known)  diltiazem (TIADYLT ER) 120 MG 24 hr capsule  2. Which pharmacy/location (including street and city if local pharmacy) is medication to be sent to? [id number] - [place], [place] - [place].  3. Do they need a 30 day or 90 day supply? 90 day

## 2021-11-16 NOTE — Telephone Encounter (Signed)
Pt's medication was sent to pt's pharmacy as requested. Confirmation received.  °

## 2021-11-23 NOTE — Telephone Encounter (Signed)
Pt c/o medication issue:  1. Name of Medication:  diltiazem (TIADYLT ER) 120 MG 24 hr capsule  2. How are you currently taking this medication (dosage and times per day)?  As prescribed  3. Are you having a reaction (difficulty breathing--STAT)?  No   4. What is your medication issue?   Patient's daughter is following up. She states the version of the medication that was called in will cost the patient $55, but her prescription is normally only about $14. She assumes this is because it was the generic instead of the brand name, but she would like a call back to clarify which version was called in + new, modified Rx if possible. Please assist.

## 2021-11-23 NOTE — Telephone Encounter (Signed)
Called pt's pharmacy and the pharmacist stated that pt's medication was ran without a discount card and that is why the medication was higher. I advised the them that if the pt has any other problems, questions or concerns, to give our office a call back. Pt's daughter verbalized understanding.

## 2021-12-02 ENCOUNTER — Other Ambulatory Visit (HOSPITAL_COMMUNITY): Payer: Self-pay

## 2021-12-02 ENCOUNTER — Other Ambulatory Visit: Payer: Self-pay | Admitting: Internal Medicine

## 2021-12-02 ENCOUNTER — Telehealth: Payer: Self-pay

## 2021-12-02 ENCOUNTER — Encounter: Payer: Self-pay | Admitting: Internal Medicine

## 2021-12-02 DIAGNOSIS — C3431 Malignant neoplasm of lower lobe, right bronchus or lung: Secondary | ICD-10-CM

## 2021-12-02 MED ORDER — HYDROCOD POLST-CPM POLST ER 10-8 MG/5ML PO SUER
5.0000 mL | Freq: Two times a day (BID) | ORAL | 0 refills | Status: DC | PRN
  Filled 2021-12-02: qty 280, 28d supply, fill #0

## 2021-12-02 NOTE — Telephone Encounter (Signed)
Pts daughter called request a refill of the following:  chlorpheniramine-HYDROcodone (TUSSIONEX) 10-8 MG/5ML SUER

## 2021-12-03 ENCOUNTER — Other Ambulatory Visit (HOSPITAL_COMMUNITY): Payer: Self-pay

## 2021-12-03 ENCOUNTER — Telehealth: Payer: Self-pay | Admitting: Internal Medicine

## 2021-12-03 NOTE — Telephone Encounter (Signed)
Scheduled per 01/05 scheduled message, called and spoke with patient's daughter. Patient will be notified of upcoming appointments.

## 2021-12-06 ENCOUNTER — Encounter: Payer: Self-pay | Admitting: Internal Medicine

## 2021-12-06 ENCOUNTER — Other Ambulatory Visit: Payer: Self-pay

## 2021-12-06 ENCOUNTER — Ambulatory Visit (INDEPENDENT_AMBULATORY_CARE_PROVIDER_SITE_OTHER): Payer: Medicare Other | Admitting: Internal Medicine

## 2021-12-06 ENCOUNTER — Ambulatory Visit: Payer: Medicare Other | Admitting: Internal Medicine

## 2021-12-06 VITALS — BP 114/62 | HR 95 | Ht <= 58 in | Wt 116.8 lb

## 2021-12-06 DIAGNOSIS — C3431 Malignant neoplasm of lower lobe, right bronchus or lung: Secondary | ICD-10-CM | POA: Diagnosis not present

## 2021-12-06 DIAGNOSIS — I498 Other specified cardiac arrhythmias: Secondary | ICD-10-CM | POA: Diagnosis not present

## 2021-12-06 MED ORDER — DILTIAZEM HCL 60 MG PO TABS: ORAL_TABLET | ORAL | 1 refills | Status: DC

## 2021-12-06 NOTE — Patient Instructions (Addendum)
Medication Instructions:  Your physician has recommended you make the following change in your medication:   ** Begin Diltiazem 60mg  - 1 tablet by mouth at 4pm as needed  Paper Rx given for Tussionex 10-8mg /63ml 237ml Take 5 mls by mouth q12h as needed for cough. 0 RF.  *If you need a refill on your cardiac medications before your next appointment, please call your pharmacy*   Lab Work: None ordered.  If you have labs (blood work) drawn today and your tests are completely normal, you will receive your results only by: [facility] (if you have MyChart) OR A paper copy in the mail If you have any lab test that is abnormal or we need to change your treatment, we will call you to review the results.   Testing/Procedures: None ordered.    Follow-Up: At [facility], you and your health needs are our priority.  As part of our continuing mission to provide you with exceptional heart care, we have created designated Provider Care Teams.  These Care Teams include your primary Cardiologist (physician) and Advanced Practice Providers (APPs -  Physician Assistants and Nurse Practitioners) who all work together to provide you with the care you need, when you need it.  We recommend signing up for the patient portal called "MyChart".  Sign up information is provided on this After Visit Summary.  MyChart is used to connect with patients for Virtual Visits (Telemedicine).  Patients are able to view lab/test results, encounter notes, upcoming appointments, etc.  Non-urgent messages can be sent to your provider as well.   To learn more about what you can do with MyChart, go to [email or web address].    Your next appointment:   6 months with Dr Caryl Comes

## 2021-12-06 NOTE — Progress Notes (Addendum)
Patient Care Team: System, Provider Not In as PCP - General Deboraha Sprang, MD as PCP - Electrophysiology (Cardiology)   HPI  [name] is a 61 y.o. female last seen by me 2018 but by the EP apps in the interim for SVT thought for to be multifocal atrial tachycardia with history of  NSCLC, adenocarcinoma (s/p RLL lobectomy surgery in 2010, and radiation therapy to recurrent lung mass completed 2012 and chemo completed 2012, maintained on Tarceva started 2013 to present), chronic dry cough, SVT.  Records and Results Reviewed  Today, she is accompanied by an interpreter. Recently, she had a couple episodes of her heart racing. When her heart races, she reports her bilateral hands tingling, dizziness, and blurred vision. These episodes primary occur from 4pm to 10pm every day for the past 2-3 days.  She denies any previous similar episodes. She is complaint with taking her diltiazem once daily. Her pharmacist advised her to not take 2 pills.   She occasionally feels deep sub-sternal central chest pain that lasts from an hour to an hour and a half. The episodes can occur regardless of activity. When she lies down, she feels short of breath and she has to sit up. Her husband gives her a back massage to alleviate the pain.   The palpitations and chest pain caused her to almost present to the ED last night.   The patient denies nocturnal dyspnea, orthopnea or peripheral edema.  There have been no syncope.  Complains of palpitations, bilateral hand tingling, dizziness, blurred vision, chest pain, and shortness of breath.  DATE TEST EF   6/18 Echo  60-65 %         Date Cr K Hgb  8/22 0.72 4.5 12.9  10/22 0.64 3.5 14.0    Past Medical History:  Diagnosis Date   Drug-induced skin rash 02/27/2017   Encounter for antineoplastic chemotherapy 01/19/2016   History of radiation therapy 05/02/11 to 06/08/11   right lung   Lung cancer [facility])    right lower lobe adenocarcinoma    Past Surgical  History:  Procedure Laterality Date   LUNG LOBECTOMY  04/18/2009   RLL   PORTACATH PLACEMENT  02/29/2012   Procedure: INSERTION PORT-A-CATH;  Surgeon: Nicanor Alcon, MD;  Location: [facility];  Service: Thoracic;  Laterality: Left;  PowerPort 8 F attachable in left internal jugular.    Current Meds  Medication Sig   acetaminophen (TYLENOL) 500 MG tablet Take 500 mg every 6 (six) hours as needed by mouth.   albuterol (PROAIR HFA) 108 (90 Base) MCG/ACT inhaler Inhale 1-2 puffs into the lungs every 6 (six) hours as needed for wheezing or shortness of breath.   aspirin EC 81 MG tablet Take 81 mg by mouth daily. Swallow whole.   chlorpheniramine-HYDROcodone (TUSSIONEX) 10-8 MG/5ML SUER Take 5 mLs by mouth every 12 (twelve) hours as needed for cough.   clindamycin (CLEOCIN T) 1 % lotion APPLY TO SKIN RASH TWICE DAILY.   diltiazem (TIADYLT ER) 120 MG 24 hr capsule Take 1 capsule by mouth daily.   erlotinib (TARCEVA) 150 MG tablet Take 1 tablet (150 mg total) by mouth daily. Take on an empty stomach at least 1 hour before or 2 hours after food.   lidocaine-prilocaine (EMLA) cream Apply 1 application topically as needed.    Allergies  Allergen Reactions   Codeine Nausea Only   Doxycycline Nausea And Vomiting    vomiting and headache  Review of Systems negative except from HPI and PMH  Physical Exam BP 114/62    Pulse 95    Ht 4\' 9"  (1.448 m)    Wt 116 lb 12.8 oz (53 kg)    LMP 02/10/2012    SpO2 97%    BMI 25.28 kg/m  Well developed and nourished in no acute distress HENT normal Neck supple with JVP-  flat  Clear Regular rate and rhythm, no murmurs or gallops Abd-soft with active BS No Clubbing cyanosis edema Skin-warm and dry A & Oriented  Grossly normal sensory and motor function  ECG Ectopic rhythm  at 95 with inverted P waves in lead II Intervals 16/08/38 CrCl cannot be calculated (Patient's most recent lab result is older than the maximum 21 days allowed.).   Assessment and   Plan SVT  ectopic atrial tachycardia  Chest pain  atypical  Cough  With recurrent tachycardia, will increase diltiazem by adding 60 mg to be taken as needed medications more tachy palpitations.  Chest pain seems noncardiac.  Her dry cough is a problem.  As per Dr. Earlie Server, we will give her a prescription for Tussionex and asked her to follow-up with her PCP as scheduled later this month      Current medicines are reviewed at length with the patient today .  The patient does not have concerns regarding medicines.  I,Mykaella Javier,acting as a scribe for Virl Axe, MD.,have documented all relevant documentation on the behalf of Virl Axe, MD,as directed by  Virl Axe, MD while in the presence of Virl Axe, MD.  I, Virl Axe, MD, have reviewed all documentation for this visit. The documentation on 12/06/21 for the exam, diagnosis, procedures, and orders are all accurate and complete.

## 2021-12-11 ENCOUNTER — Other Ambulatory Visit (HOSPITAL_COMMUNITY): Payer: Self-pay

## 2021-12-13 ENCOUNTER — Other Ambulatory Visit: Payer: Self-pay | Admitting: *Deleted

## 2021-12-13 DIAGNOSIS — C3431 Malignant neoplasm of lower lobe, right bronchus or lung: Secondary | ICD-10-CM

## 2021-12-14 ENCOUNTER — Inpatient Hospital Stay: Payer: Medicare Other

## 2021-12-14 ENCOUNTER — Encounter: Payer: Self-pay | Admitting: Internal Medicine

## 2021-12-14 ENCOUNTER — Inpatient Hospital Stay: Payer: Medicare Other | Attending: Internal Medicine | Admitting: Internal Medicine

## 2021-12-14 ENCOUNTER — Other Ambulatory Visit: Payer: Self-pay

## 2021-12-14 ENCOUNTER — Other Ambulatory Visit: Payer: Medicare Other

## 2021-12-14 VITALS — BP 130/80 | HR 91 | Temp 97.2°F | Resp 17 | Ht <= 58 in | Wt 115.6 lb

## 2021-12-14 DIAGNOSIS — Z79899 Other long term (current) drug therapy: Secondary | ICD-10-CM | POA: Diagnosis not present

## 2021-12-14 DIAGNOSIS — R059 Cough, unspecified: Secondary | ICD-10-CM | POA: Diagnosis not present

## 2021-12-14 DIAGNOSIS — Z923 Personal history of irradiation: Secondary | ICD-10-CM | POA: Diagnosis not present

## 2021-12-14 DIAGNOSIS — Z5111 Encounter for antineoplastic chemotherapy: Secondary | ICD-10-CM

## 2021-12-14 DIAGNOSIS — R21 Rash and other nonspecific skin eruption: Secondary | ICD-10-CM | POA: Insufficient documentation

## 2021-12-14 DIAGNOSIS — C3490 Malignant neoplasm of unspecified part of unspecified bronchus or lung: Secondary | ICD-10-CM

## 2021-12-14 DIAGNOSIS — C3431 Malignant neoplasm of lower lobe, right bronchus or lung: Secondary | ICD-10-CM

## 2021-12-14 DIAGNOSIS — R197 Diarrhea, unspecified: Secondary | ICD-10-CM | POA: Diagnosis not present

## 2021-12-14 LAB — CBC WITH DIFFERENTIAL (CANCER CENTER ONLY)
Abs Immature Granulocytes: 0.02 10*3/uL (ref 0.00–0.07)
Basophils Absolute: 0 10*3/uL (ref 0.0–0.1)
Basophils Relative: 0 %
Eosinophils Absolute: 0.1 10*3/uL (ref 0.0–0.5)
Eosinophils Relative: 1 %
HCT: 43.6 % (ref 36.0–46.0)
Hemoglobin: 14.1 g/dL (ref 12.0–15.0)
Immature Granulocytes: 0 %
Lymphocytes Relative: 28 %
Lymphs Abs: 1.9 10*3/uL (ref 0.7–4.0)
MCH: 27.9 pg (ref 26.0–34.0)
MCHC: 32.3 g/dL (ref 30.0–36.0)
MCV: 86.2 fL (ref 80.0–100.0)
Monocytes Absolute: 0.5 10*3/uL (ref 0.1–1.0)
Monocytes Relative: 8 %
Neutro Abs: 4.1 10*3/uL (ref 1.7–7.7)
Neutrophils Relative %: 63 %
Platelet Count: 333 10*3/uL (ref 150–400)
RBC: 5.06 MIL/uL (ref 3.87–5.11)
RDW: 14.9 % (ref 11.5–15.5)
WBC Count: 6.7 10*3/uL (ref 4.0–10.5)
nRBC: 0 % (ref 0.0–0.2)

## 2021-12-14 LAB — CMP (CANCER CENTER ONLY)
ALT: 11 U/L (ref 0–44)
AST: 18 U/L (ref 15–41)
Albumin: 4.3 g/dL (ref 3.5–5.0)
Alkaline Phosphatase: 78 U/L (ref 38–126)
Anion gap: 7 (ref 5–15)
BUN: 10 mg/dL (ref 6–20)
CO2: 31 mmol/L (ref 22–32)
Calcium: 9.5 mg/dL (ref 8.9–10.3)
Chloride: 101 mmol/L (ref 98–111)
Creatinine: 0.67 mg/dL (ref 0.44–1.00)
GFR, Estimated: >60 mL/min (ref >60)
Glucose, Bld: 88 mg/dL (ref 70–99)
Potassium: 4.7 mmol/L (ref 3.5–5.1)
Sodium: 139 mmol/L (ref 135–145)
Total Bilirubin: 0.9 mg/dL (ref 0.3–1.2)
Total Protein: 7.8 g/dL (ref 6.5–8.1)

## 2021-12-14 MED ORDER — HYDROCOD POLST-CPM POLST ER 10-8 MG/5ML PO SUER
5.0000 mL | Freq: Two times a day (BID) | ORAL | 0 refills | Status: DC | PRN
Start: 2021-12-14 — End: 2022-02-21

## 2021-12-14 NOTE — Progress Notes (Signed)
#        [facility] [phone]   [phone]  OFFICE PROGRESS NOTE  System, Provider Not In No address on file  PRINCIPAL DIAGNOSIS: Local recurrence of non-small cell lung cancer, adenocarcinoma, initially diagnosed as stage IB (T2a N0 M0) in March of 2010.   PRIOR THERAPY:  Status post right lower lobectomy under the care of Dr. Cyndia Bent on 04/08/2009. Status post palliative radiotherapy to the right lower lobe recurrent lung mass under the care of Dr. Lisbeth Renshaw, completed on June 08, 2011. Systemic chemotherapy with carboplatin for AUC of 5 and Alimta 500 mg/M2 every 3 weeks. She is status post 3 cycles.  CURRENT THERAPY: Tarceva 150 mg by mouth daily, therapy beginning 07/24/2012. Status post approximately 104 months of therapy.   INTERVAL HISTORY: [name] 61 y.o. female returns to the clinic today for follow-up visit accompanied by her interpreter.  The patient is feeling fine today with no concerning complaints except for the rash on the face especially in the eyebrows area.  She denied having any chest pain, shortness of breath but has dry cough and currently using Tussionex at regular basis.  She has no hemoptysis.  She denied having any nausea, vomiting but has few episodes of diarrhea with no constipation or abdominal pain.  She has no recent weight loss or night sweats.  She is here today for evaluation and repeat blood work.  MEDICAL HISTORY: Past Medical History:  Diagnosis Date   Drug-induced skin rash 02/27/2017   Encounter for antineoplastic chemotherapy 01/19/2016   History of radiation therapy 05/02/11 to 06/08/11   right lung   Lung cancer ([place])    right lower lobe adenocarcinoma    ALLERGIES:  is allergic to codeine and doxycycline.  MEDICATIONS:  Current Outpatient Medications  Medication Sig Dispense Refill   acetaminophen (TYLENOL) 500 MG tablet Take 500 mg every 6 (six) hours as needed by mouth.     albuterol (PROAIR HFA) 108 (90  Base) MCG/ACT inhaler Inhale 1-2 puffs into the lungs every 6 (six) hours as needed for wheezing or shortness of breath. 1 Inhaler 2   aspirin EC 81 MG tablet Take 81 mg by mouth daily. Swallow whole.     chlorpheniramine-HYDROcodone (TUSSIONEX) 10-8 MG/5ML SUER Take 5 mLs by mouth every 12 (twelve) hours as needed for cough. 280 mL 0   diltiazem (CARDIZEM) 60 MG tablet Take one tablet by mouth at 4pm as needed 30 tablet 1   diltiazem (TIADYLT ER) 120 MG 24 hr capsule Take 1 capsule by mouth daily. 90 capsule 2   erlotinib (TARCEVA) 150 MG tablet Take 1 tablet (150 mg total) by mouth daily. Take on an empty stomach at least 1 hour before or 2 hours after food. 30 tablet 11   lidocaine-prilocaine (EMLA) cream Apply 1 application topically as needed. 30 g 0   No current facility-administered medications for this visit.    SURGICAL HISTORY:  Past Surgical History:  Procedure Laterality Date   LUNG LOBECTOMY  04/18/2009   RLL   PORTACATH PLACEMENT  02/29/2012   Procedure: INSERTION PORT-A-CATH;  Surgeon: Nicanor Alcon, MD;  Location: [facility];  Service: Thoracic;  Laterality: Left;  PowerPort 8 F attachable in left internal jugular.    REVIEW OF SYSTEMS:  A comprehensive review of systems was negative except for: Constitutional: positive for fatigue Respiratory: positive for cough Integument/breast: positive for rash   PHYSICAL EXAMINATION: General appearance: alert, cooperative, fatigued, and no distress Head:  Normocephalic, without obvious abnormality, atraumatic Neck: no adenopathy, no JVD, supple, symmetrical, trachea midline, and thyroid not enlarged, symmetric, no tenderness/mass/nodules Lymph nodes: Cervical, supraclavicular, and axillary nodes normal. Resp: clear to auscultation bilaterally Back: symmetric, no curvature. ROM normal. No CVA tenderness. Cardio: regular rate and rhythm, S1, S2 normal, no murmur, click, rub or gallop GI: soft, non-tender; bowel sounds normal; no masses,   no organomegaly Extremities: extremities normal, atraumatic, no cyanosis or edema  ECOG PERFORMANCE STATUS: 1 - Symptomatic but completely ambulatory  Blood pressure 130/80, pulse 91, temperature (!) 97.2 F (36.2 C), temperature source Tympanic, resp. rate 17, height 4\' 9"  (1.448 m), weight 115 lb 9.6 oz (52.4 kg), last menstrual period 02/10/2012, SpO2 99 %.  LABORATORY DATA: Lab Results  Component Value Date   WBC 6.7 12/14/2021   HGB 14.1 12/14/2021   HCT 43.6 12/14/2021   MCV 86.2 12/14/2021   PLT 333 12/14/2021      Chemistry      Component Value Date/Time   NA 142 09/21/2021 0734   NA 143 10/30/2017 1031   K 3.5 09/21/2021 0734   K 4.3 10/30/2017 1031   CL 101 09/21/2021 0734   CL 102 04/30/2013 1002   CO2 32 09/21/2021 0734   CO2 29 10/30/2017 1031   BUN 9 09/21/2021 0734   BUN 14.6 10/30/2017 1031   CREATININE 0.64 09/21/2021 0734   CREATININE 0.7 10/30/2017 1031      Component Value Date/Time   CALCIUM 9.0 09/21/2021 0734   CALCIUM 9.2 10/30/2017 1031   ALKPHOS 116 09/21/2021 0734   ALKPHOS 103 10/30/2017 1031   AST 27 09/21/2021 0734   AST 20 10/30/2017 1031   ALT 30 09/21/2021 0734   ALT 14 10/30/2017 1031   BILITOT 0.4 09/21/2021 0734   BILITOT 0.79 10/30/2017 1031       RADIOGRAPHIC STUDIES: No results found.   ASSESSMENT AND PLAN:  This is a very pleasant 61 years old Asian female with recurrent non-small cell lung cancer, adenocarcinoma status post surgical resection followed by systemic chemotherapy with carboplatin and Alimta followed by disease progression and the patient was started on treatment with Tarceva 150 mg by mouth daily status post 104 months  The patient has been tolerating her treatment with Tarceva fairly well except for the mild skin rash and diarrhea. I recommended for her to continue her current treatment with Tarceva with the same dose. I will see her back for follow-up visit in 3 months for evaluation with repeat CT scan of  the chest for restaging of her disease. For the dry cough, she was given prescription and refill of Tussionex. For the skin rash she will continue to apply the clindamycin lotion on as-needed basis. The patient was advised to call immediately if she has any other concerning symptoms in the interval.  All questions were answered. The patient knows to call the clinic with any problems, questions or concerns. We can certainly see the patient much sooner if necessary.  Disclaimer: This note was dictated with voice recognition software. Similar sounding words can inadvertently be transcribed and may not be corrected upon review.

## 2021-12-20 ENCOUNTER — Other Ambulatory Visit: Payer: Medicare Other

## 2021-12-20 ENCOUNTER — Ambulatory Visit: Payer: Medicare Other | Admitting: Internal Medicine

## 2022-02-01 ENCOUNTER — Other Ambulatory Visit (HOSPITAL_COMMUNITY): Payer: Self-pay

## 2022-02-12 DIAGNOSIS — Z20822 Contact with and (suspected) exposure to covid-19: Secondary | ICD-10-CM | POA: Diagnosis not present

## 2022-02-18 DIAGNOSIS — Z20822 Contact with and (suspected) exposure to covid-19: Secondary | ICD-10-CM | POA: Diagnosis not present

## 2022-02-21 ENCOUNTER — Encounter: Payer: Self-pay | Admitting: Emergency Medicine

## 2022-02-21 ENCOUNTER — Other Ambulatory Visit: Payer: Self-pay

## 2022-02-21 ENCOUNTER — Ambulatory Visit (INDEPENDENT_AMBULATORY_CARE_PROVIDER_SITE_OTHER): Payer: Medicare Other | Admitting: Emergency Medicine

## 2022-02-21 VITALS — BP 110/72 | HR 70 | Temp 98.2°F | Ht <= 58 in | Wt 116.0 lb

## 2022-02-21 DIAGNOSIS — Z7689 Persons encountering health services in other specified circumstances: Secondary | ICD-10-CM

## 2022-02-21 DIAGNOSIS — R7303 Prediabetes: Secondary | ICD-10-CM | POA: Diagnosis not present

## 2022-02-21 DIAGNOSIS — C3431 Malignant neoplasm of lower lobe, right bronchus or lung: Secondary | ICD-10-CM

## 2022-02-21 DIAGNOSIS — I498 Other specified cardiac arrhythmias: Secondary | ICD-10-CM

## 2022-02-21 DIAGNOSIS — Z95828 Presence of other vascular implants and grafts: Secondary | ICD-10-CM

## 2022-02-21 DIAGNOSIS — K219 Gastro-esophageal reflux disease without esophagitis: Secondary | ICD-10-CM

## 2022-02-21 DIAGNOSIS — R634 Abnormal weight loss: Secondary | ICD-10-CM | POA: Insufficient documentation

## 2022-02-21 DIAGNOSIS — I10 Essential (primary) hypertension: Secondary | ICD-10-CM

## 2022-02-21 DIAGNOSIS — Z13220 Encounter for screening for lipoid disorders: Secondary | ICD-10-CM | POA: Diagnosis not present

## 2022-02-21 LAB — LIPID PANEL
Cholesterol: 185 mg/dL (ref 0–200)
HDL: 62.7 mg/dL (ref >39.00)
LDL Cholesterol: 88 mg/dL (ref 0–99)
NonHDL: 121.88
Total CHOL/HDL Ratio: 3
Triglycerides: 169 mg/dL — ABNORMAL HIGH (ref 0.0–149.0)
VLDL: 33.8 mg/dL (ref 0.0–40.0)

## 2022-02-21 LAB — HEMOGLOBIN A1C: Hgb A1c MFr Bld: 6.2 % (ref 4.6–6.5)

## 2022-02-21 NOTE — Assessment & Plan Note (Signed)
Stable and asymptomatic at present time. ?

## 2022-02-21 NOTE — Assessment & Plan Note (Signed)
Well-controlled. ?BP Readings from Last 3 Encounters:  ?02/21/22 110/72  ?12/14/21 130/80  ?12/06/21 114/62  ?Continue diltiazem 120 mg daily. ? ?

## 2022-02-21 NOTE — Patient Instructions (Signed)
Refill for Tussionex needs to be approved and prescribed by her oncologist who prescribed it. ?Continue all other medications.  No changes. ? ?Health Maintenance, Female ?Adopting a healthy lifestyle and getting preventive care are important in promoting health and wellness. Ask your health care provider about: ?The right schedule for you to have regular tests and exams. ?Things you can do on your own to prevent diseases and keep yourself healthy. ?What should I know about diet, weight, and exercise? ?Eat a healthy diet ? ?Eat a diet that includes plenty of vegetables, fruits, low-fat dairy products, and lean protein. ?Do not eat a lot of foods that are high in solid fats, added sugars, or sodium. ?Maintain a healthy weight ?Body mass index (BMI) is used to identify weight problems. It estimates body fat based on height and weight. Your health care provider can help determine your BMI and help you achieve or maintain a healthy weight. ?Get regular exercise ?Get regular exercise. This is one of the most important things you can do for your health. Most adults should: ?Exercise for at least 150 minutes each week. The exercise should increase your heart rate and make you sweat (moderate-intensity exercise). ?Do strengthening exercises at least twice a week. This is in addition to the moderate-intensity exercise. ?Spend less time sitting. Even light physical activity can be beneficial. ?Watch cholesterol and blood lipids ?Have your blood tested for lipids and cholesterol at 60 years of age, then have this test every 5 years. ?Have your cholesterol levels checked more often if: ?Your lipid or cholesterol levels are high. ?You are older than 60 years of age. ?You are at high risk for heart disease. ?What should I know about cancer screening? ?Depending on your health history and family history, you may need to have cancer screening at various ages. This may include screening for: ?Breast cancer. ?Cervical  cancer. ?Colorectal cancer. ?Skin cancer. ?Lung cancer. ?What should I know about heart disease, diabetes, and high blood pressure? ?Blood pressure and heart disease ?High blood pressure causes heart disease and increases the risk of stroke. This is more likely to develop in people who have high blood pressure readings or are overweight. ?Have your blood pressure checked: ?Every 3-5 years if you are 60-71 years of age. ?Every year if you are 33 years old or older. ?Diabetes ?Have regular diabetes screenings. This checks your fasting blood sugar level. Have the screening done: ?Once every three years after age 60 if you are at a normal weight and have a low risk for diabetes. ?More often and at a younger age if you are overweight or have a high risk for diabetes. ?What should I know about preventing infection? ?Hepatitis B ?If you have a higher risk for hepatitis B, you should be screened for this virus. Talk with your health care provider to find out if you are at risk for hepatitis B infection. ?Hepatitis C ?Testing is recommended for: ?Everyone born from [birth year] through 1964. ?Anyone with known risk factors for hepatitis C. ?Sexually transmitted infections (STIs) ?Get screened for STIs, including gonorrhea and chlamydia, if: ?You are sexually active and are younger than 60 years of age. ?You are older than 60 years of age and your health care provider tells you that you are at risk for this type of infection. ?Your sexual activity has changed since you were last screened, and you are at increased risk for chlamydia or gonorrhea. Ask your health care provider if you are at risk. ?Ask your health care  provider about whether you are at high risk for HIV. Your health care provider may recommend a prescription medicine to help prevent HIV infection. If you choose to take medicine to prevent HIV, you should first get tested for HIV. You should then be tested every 3 months for as long as you are taking the  medicine. ?Pregnancy ?If you are about to stop having your period (premenopausal) and you may become pregnant, seek counseling before you get pregnant. ?Take 400 to 800 micrograms (mcg) of folic acid every day if you become pregnant. ?Ask for birth control (contraception) if you want to prevent pregnancy. ?Osteoporosis and menopause ?Osteoporosis is a disease in which the bones lose minerals and strength with aging. This can result in bone fractures. If you are 60 years old or older, or if you are at risk for osteoporosis and fractures, ask your health care provider if you should: ?Be screened for bone loss. ?Take a calcium or vitamin D supplement to lower your risk of fractures. ?Be given hormone replacement therapy (HRT) to treat symptoms of menopause. ?Follow these instructions at home: ?Alcohol use ?Do not drink alcohol if: ?Your health care provider tells you not to drink. ?You are pregnant, may be pregnant, or are planning to become pregnant. ?If you drink alcohol: ?Limit how much you have to: ?0-1 drink a day. ?Know how much alcohol is in your drink. In the [place] one drink equals one 12 oz bottle of beer (355 mL), one 5 oz glass of wine (148 mL), or one 1? oz glass of hard liquor (44 mL). ?Lifestyle ?Do not use any products that contain nicotine or tobacco. These products include cigarettes, chewing tobacco, and vaping devices, such as e-cigarettes. If you need help quitting, ask your health care provider. ?Do not use street drugs. ?Do not share needles. ?Ask your health care provider for help if you need support or information about quitting drugs. ?General instructions ?Schedule regular health, dental, and eye exams. ?Stay current with your vaccines. ?Tell your health care provider if: ?You often feel depressed. ?You have ever been abused or do not feel safe at home. ?Summary ?Adopting a healthy lifestyle and getting preventive care are important in promoting health and wellness. ?Follow your health care  provider's instructions about healthy diet, exercising, and getting tested or screened for diseases. ?Follow your health care provider's instructions on monitoring your cholesterol and blood pressure. ?This information is not intended to replace advice given to you by your health care provider. Make sure you discuss any questions you have with your health care provider. ?Document Revised: 04/05/2021 Document Reviewed: 04/05/2021 ?Elsevier Patient Education ? [place]. ? ?

## 2022-02-21 NOTE — Assessment & Plan Note (Signed)
Stable weight for the past 3 months. ?

## 2022-02-21 NOTE — Progress Notes (Signed)
[name] ?61 y.o. ? ? ?Chief Complaint  ?Patient presents with  ? New Patient (Initial Visit)  ? Medication Refill  ?  Tussionex refill   ? ? ?HISTORY OF PRESENT ILLNESS: ?This is a 61 y.o. female first visit to this office.  Here to establish care with me. ?Here with interpreter today. ?Has history of hypertension on diltiazem 160 mg daily. ?History of lung cancer.  Sees oncologist on a regular basis.  Doing well.  Thinks she is done with treatment. ?Was given Tussionex for cough by oncologist.  Requesting refill.  Told her she needed to get a refill from her oncologist. ?Last seen by oncologist on 12/14/2021.  Assessment and plan as follows: ?ASSESSMENT AND PLAN:  ?This is a very pleasant 61 years old Asian female with recurrent non-small cell lung cancer, adenocarcinoma status post surgical resection followed by systemic chemotherapy with carboplatin and Alimta followed by disease progression and the patient was started on treatment with Tarceva 150 mg by mouth daily status post 104 months  ?The patient has been tolerating her treatment with Tarceva fairly well except for the mild skin rash and diarrhea. ?I recommended for her to continue her current treatment with Tarceva with the same dose. ?I will see her back for follow-up visit in 3 months for evaluation with repeat CT scan of the chest for restaging of her disease. ?For the dry cough, she was given prescription and refill of Tussionex. ?For the skin rash she will continue to apply the clindamycin lotion on as-needed basis. ?The patient was advised to call immediately if she has any other concerning symptoms in the interval. ? ? ?HPI ? ? ?Prior to Admission medications   ?Medication Sig Start Date End Date Taking? Authorizing Provider  ?acetaminophen (TYLENOL) 500 MG tablet Take 500 mg every 6 (six) hours as needed by mouth.   Yes [provider]  ?albuterol (PROAIR HFA) 108 (90 Base) MCG/ACT inhaler Inhale 1-2 puffs into the lungs every 6 (six) hours  as needed for wheezing or shortness of breath. 12/24/18  Yes Curt Bears, MD  ?diltiazem (CARDIZEM) 60 MG tablet Take one tablet by mouth at 4pm as needed 12/06/21  Yes Deboraha Sprang, MD  ?diltiazem (TIADYLT ER) 120 MG 24 hr capsule Take 1 capsule by mouth daily. 11/16/21  Yes Deboraha Sprang, MD  ?erlotinib (TARCEVA) 150 MG tablet Take 1 tablet (150 mg total) by mouth daily. Take on an empty stomach at least 1 hour before or 2 hours after food. 12/21/20  Yes Curt Bears, MD  ?lidocaine-prilocaine (EMLA) cream Apply 1 application topically as needed. 04/30/18  Yes Curt Bears, MD  ?aspirin EC 81 MG tablet Take 81 mg by mouth daily. Swallow whole. ?Patient not taking: Reported on 02/21/2022    [provider]  ? ? ?Allergies  ?Allergen Reactions  ? Codeine Nausea Only  ? Doxycycline Nausea And Vomiting  ?  vomiting and headache  ? ? ?Patient Active Problem List  ? Diagnosis Date Noted  ? Seasonal allergic rhinitis due to pollen 06/15/2017  ? Essential hypertension 05/23/2017  ? Atrial arrhythmia 05/21/2017  ? Port catheter in place 12/27/2016  ? Encounter for antineoplastic chemotherapy 06/13/2016  ? GERD (gastroesophageal reflux disease) 04/18/2016  ? Primary cancer of right lower lobe of lung ([place]) 05/06/2009  ? LEIOMYOMA, UTERUS 05/06/2009  ? ? ?Past Medical History:  ?Diagnosis Date  ? Drug-induced skin rash 02/27/2017  ? Encounter for antineoplastic chemotherapy 01/19/2016  ? History of radiation therapy  05/02/11 to 06/08/11  ? right lung  ? Lung cancer ([place])   ? right lower lobe adenocarcinoma  ? ? ?Past Surgical History:  ?Procedure Laterality Date  ? LUNG LOBECTOMY  04/18/2009  ? RLL  ? PORTACATH PLACEMENT  02/29/2012  ? Procedure: INSERTION PORT-A-CATH;  Surgeon: Nicanor Alcon, MD;  Location: [facility];  Service: Thoracic;  Laterality: Left;  PowerPort 8 F attachable in left internal jugular.  ? ? ?Social History  ? ?Socioeconomic History  ? Marital status: Married  ?  Spouse name: Not on file  ?  Number of children: 7  ? Years of education: Not on file  ? Highest education level: Not on file  ?Occupational History  ? Not on file  ?Tobacco Use  ? Smoking status: Never  ? Smokeless tobacco: Never  ?Vaping Use  ? Vaping Use: Never used  ?Substance and Sexual Activity  ? Alcohol use: No  ? Drug use: No  ? Sexual activity: Yes  ?Other Topics Concern  ? Not on file  ?Social History Narrative  ? 09/01/2014  ? The patient is a 61 year old [place] woman.  ? The patient is married and has 7 children. One child passed away.  ? The patient came to the [place] in approximately 1990. They were in [place] for 10-11 years and then moved to [place], [place] since then.  ? Patient does not smoke, drink, or use illicit drugs.  ? Patient has not used smokeless tobacco products.  ? Fun/Hobby: Go shopping   ? ?Social Determinants of Health  ? ?Financial Resource Strain: Not on file  ?Food Insecurity: Not on file  ?Transportation Needs: Not on file  ?Physical Activity: Not on file  ?Stress: Not on file  ?Social Connections: Not on file  ?Intimate Partner Violence: Not on file  ? ? ?Family History  ?Problem Relation Age of Onset  ? Heart Problems Mother   ? Cancer Neg Hx   ? ? ? ?Review of Systems  ?Constitutional: Negative.  Negative for chills and fever.  ?HENT: Negative.  Negative for congestion and sore throat.   ?Respiratory:  Positive for cough. Negative for hemoptysis, sputum production, shortness of breath and wheezing.   ?Cardiovascular: Negative.  Negative for chest pain and palpitations.  ?Gastrointestinal:  Negative for abdominal pain, diarrhea, nausea and vomiting.  ?Skin: Negative.  Negative for rash.  ?Neurological: Negative.  Negative for dizziness and headaches.  ?All other systems reviewed and are negative. ? ?Today's Vitals  ? 02/21/22 0948  ?BP: 110/72  ?Pulse: 70  ?Temp: 98.2 ?F (36.8 ?C)  ?TempSrc: Oral  ?SpO2: 97%  ?Weight: 116 lb (52.6 kg)  ?Height: 4\' 9"  (1.448 m)  ? ?Body mass index is  25.1 kg/m?. ?Wt Readings from Last 3 Encounters:  ?02/21/22 116 lb (52.6 kg)  ?12/14/21 115 lb 9.6 oz (52.4 kg)  ?12/06/21 116 lb 12.8 oz (53 kg)  ? ? ?Physical Exam ?Vitals reviewed.  ?Constitutional:   ?   Appearance: Normal appearance.  ?HENT:  ?   Head: Normocephalic.  ?Eyes:  ?   Extraocular Movements: Extraocular movements intact.  ?   Conjunctiva/sclera: Conjunctivae normal.  ?   Pupils: Pupils are equal, round, and reactive to light.  ?Cardiovascular:  ?   Rate and Rhythm: Normal rate and regular rhythm.  ?   Pulses: Normal pulses.  ?   Heart sounds: Normal heart sounds.  ?Pulmonary:  ?   Effort: Pulmonary effort is normal.  ?  Breath sounds: Normal breath sounds.  ?Abdominal:  ?   Palpations: Abdomen is soft.  ?   Tenderness: There is no abdominal tenderness.  ?Musculoskeletal:  ?   Cervical back: No tenderness.  ?   Right lower leg: No edema.  ?   Left lower leg: No edema.  ?Lymphadenopathy:  ?   Cervical: No cervical adenopathy.  ?Skin: ?   General: Skin is warm and dry.  ?   Capillary Refill: Capillary refill takes less than 2 seconds.  ?   Comments: Port-A-Cath in place left upper chest  ?Neurological:  ?   General: No focal deficit present.  ?   Mental Status: She is alert and oriented to person, place, and time.  ?Psychiatric:     ?   Mood and Affect: Mood normal.     ?   Behavior: Behavior normal.  ? ? ? ?ASSESSMENT & PLAN: ?A total of 50 minutes was spent with the patient and counseling/coordination of care regarding preparing for this visit, review of available medical records, review of multiple chronic medical problems and their management, review of all medications, comprehensive history and physical examination, review of most recent blood work results including CBC and CMP, weight loss issues and need for additional blood work, prognosis, documentation and need for follow-up. ? ?Problem List Items Addressed This Visit   ? ?  ? Cardiovascular and Mediastinum  ? Atrial arrhythmia  ?  Stable.   Taking additional 60 mg of Cardizem in the afternoon as needed. ?  ?  ? Essential hypertension  ?  Well-controlled. ?BP Readings from Last 3 Encounters:  ?02/21/22 110/72  ?12/14/21 130/80  ?12/06/21 114/6

## 2022-02-21 NOTE — Assessment & Plan Note (Signed)
Stable.  History of recurrent non-small cell lung carcinoma, adenocarcinoma. ?Sees oncologist on a regular basis, Dr. Julien Nordmann. ?

## 2022-02-21 NOTE — Assessment & Plan Note (Signed)
Stable.  Taking additional 60 mg of Cardizem in the afternoon as needed. ?

## 2022-02-22 LAB — THYROID PANEL WITH TSH
Free Thyroxine Index: 2.4 (ref 1.4–3.8)
T3 Uptake: 27 % (ref 22–35)
T4, Total: 9 ug/dL (ref 5.1–11.9)
TSH: 0.81 mIU/L (ref 0.40–4.50)

## 2022-02-25 ENCOUNTER — Telehealth: Payer: Self-pay

## 2022-02-25 NOTE — Telephone Encounter (Signed)
Pts son called requesting a refill of the following: ? ?Chlorpheniramine-HYDROcodone (TUSSIONEX) 10-8 MG/5ML SUER  ?

## 2022-02-26 ENCOUNTER — Other Ambulatory Visit: Payer: Self-pay | Admitting: Internal Medicine

## 2022-02-26 MED ORDER — HYDROCOD POLI-CHLORPHE POLI ER 10-8 MG/5ML PO SUER: 5.0000 mL | Freq: Two times a day (BID) | ORAL | 0 refills | Status: DC | PRN

## 2022-03-03 DIAGNOSIS — Z20822 Contact with and (suspected) exposure to covid-19: Secondary | ICD-10-CM | POA: Diagnosis not present

## 2022-03-03 DIAGNOSIS — R059 Cough, unspecified: Secondary | ICD-10-CM | POA: Diagnosis not present

## 2022-03-03 DIAGNOSIS — R051 Acute cough: Secondary | ICD-10-CM | POA: Diagnosis not present

## 2022-03-14 ENCOUNTER — Other Ambulatory Visit: Payer: Self-pay

## 2022-03-14 ENCOUNTER — Inpatient Hospital Stay: Payer: Medicare Other | Attending: Internal Medicine

## 2022-03-14 ENCOUNTER — Ambulatory Visit (HOSPITAL_COMMUNITY)
Admission: RE | Admit: 2022-03-14 | Discharge: 2022-03-14 | Disposition: A | Discharge status: Home / Self Care | Payer: Medicare Other | Source: Ambulatory Visit | Attending: Internal Medicine | Admitting: Internal Medicine

## 2022-03-14 DIAGNOSIS — R059 Cough, unspecified: Secondary | ICD-10-CM | POA: Insufficient documentation

## 2022-03-14 DIAGNOSIS — C3490 Malignant neoplasm of unspecified part of unspecified bronchus or lung: Secondary | ICD-10-CM

## 2022-03-14 DIAGNOSIS — R918 Other nonspecific abnormal finding of lung field: Secondary | ICD-10-CM | POA: Diagnosis not present

## 2022-03-14 DIAGNOSIS — Z20822 Contact with and (suspected) exposure to covid-19: Secondary | ICD-10-CM | POA: Diagnosis not present

## 2022-03-14 DIAGNOSIS — C3431 Malignant neoplasm of lower lobe, right bronchus or lung: Secondary | ICD-10-CM | POA: Insufficient documentation

## 2022-03-14 DIAGNOSIS — R21 Rash and other nonspecific skin eruption: Secondary | ICD-10-CM | POA: Insufficient documentation

## 2022-03-14 DIAGNOSIS — I700 Atherosclerosis of aorta: Secondary | ICD-10-CM | POA: Diagnosis not present

## 2022-03-14 DIAGNOSIS — Z79899 Other long term (current) drug therapy: Secondary | ICD-10-CM | POA: Insufficient documentation

## 2022-03-14 LAB — CBC WITH DIFFERENTIAL (CANCER CENTER ONLY)
Abs Immature Granulocytes: 0.02 10*3/uL (ref 0.00–0.07)
Basophils Absolute: 0 10*3/uL (ref 0.0–0.1)
Basophils Relative: 0 %
Eosinophils Absolute: 0.1 10*3/uL (ref 0.0–0.5)
Eosinophils Relative: 1 %
HCT: 36.7 % (ref 36.0–46.0)
Hemoglobin: 11.6 g/dL — ABNORMAL LOW (ref 12.0–15.0)
Immature Granulocytes: 0 %
Lymphocytes Relative: 21 %
Lymphs Abs: 1.7 10*3/uL (ref 0.7–4.0)
MCH: 27.5 pg (ref 26.0–34.0)
MCHC: 31.6 g/dL (ref 30.0–36.0)
MCV: 87 fL (ref 80.0–100.0)
Monocytes Absolute: 0.7 10*3/uL (ref 0.1–1.0)
Monocytes Relative: 9 %
Neutro Abs: 5.8 10*3/uL (ref 1.7–7.7)
Neutrophils Relative %: 69 %
Platelet Count: 242 10*3/uL (ref 150–400)
RBC: 4.22 MIL/uL (ref 3.87–5.11)
RDW: 14.7 % (ref 11.5–15.5)
WBC Count: 8.4 10*3/uL (ref 4.0–10.5)
nRBC: 0 % (ref 0.0–0.2)

## 2022-03-14 LAB — CMP (CANCER CENTER ONLY)
ALT: 51 U/L — ABNORMAL HIGH (ref 0–44)
AST: 60 U/L — ABNORMAL HIGH (ref 15–41)
Albumin: 4 g/dL (ref 3.5–5.0)
Alkaline Phosphatase: 101 U/L (ref 38–126)
Anion gap: 6 (ref 5–15)
BUN: 12 mg/dL (ref 6–20)
CO2: 31 mmol/L (ref 22–32)
Calcium: 8.7 mg/dL — ABNORMAL LOW (ref 8.9–10.3)
Chloride: 103 mmol/L (ref 98–111)
Creatinine: 0.54 mg/dL (ref 0.44–1.00)
GFR, Estimated: >60 mL/min (ref >60)
Glucose, Bld: 112 mg/dL — ABNORMAL HIGH (ref 70–99)
Potassium: 4.3 mmol/L (ref 3.5–5.1)
Sodium: 140 mmol/L (ref 135–145)
Total Bilirubin: 0.5 mg/dL (ref 0.3–1.2)
Total Protein: 6.9 g/dL (ref 6.5–8.1)

## 2022-03-14 MED ORDER — IOHEXOL 300 MG/ML  SOLN
75.0000 mL | Freq: Once | INTRAMUSCULAR | Status: AC | PRN
  Administered 2022-03-14: 75 mL via INTRAVENOUS

## 2022-03-14 MED ORDER — SODIUM CHLORIDE (PF) 0.9 % IJ SOLN
INTRAMUSCULAR | Status: AC
  Filled 2022-03-14: qty 50

## 2022-03-21 ENCOUNTER — Inpatient Hospital Stay (HOSPITAL_BASED_OUTPATIENT_CLINIC_OR_DEPARTMENT_OTHER): Payer: Medicare Other | Admitting: Internal Medicine

## 2022-03-21 ENCOUNTER — Other Ambulatory Visit: Payer: Self-pay

## 2022-03-21 VITALS — BP 131/89 | HR 75 | Temp 97.8°F | Resp 16 | Wt 121.8 lb

## 2022-03-21 DIAGNOSIS — Z5111 Encounter for antineoplastic chemotherapy: Secondary | ICD-10-CM | POA: Diagnosis not present

## 2022-03-21 DIAGNOSIS — Z79899 Other long term (current) drug therapy: Secondary | ICD-10-CM | POA: Diagnosis not present

## 2022-03-21 DIAGNOSIS — C3431 Malignant neoplasm of lower lobe, right bronchus or lung: Secondary | ICD-10-CM | POA: Diagnosis not present

## 2022-03-21 DIAGNOSIS — R21 Rash and other nonspecific skin eruption: Secondary | ICD-10-CM | POA: Diagnosis not present

## 2022-03-21 DIAGNOSIS — R059 Cough, unspecified: Secondary | ICD-10-CM | POA: Diagnosis not present

## 2022-03-21 MED ORDER — HYDROCOD POLI-CHLORPHE POLI ER 10-8 MG/5ML PO SUER: 5.0000 mL | Freq: Two times a day (BID) | ORAL | 0 refills | Status: DC | PRN

## 2022-03-21 NOTE — Progress Notes (Signed)
#   ?     [place] ?[phone]   [phone] ? ?OFFICE PROGRESS NOTE ? ?System, Provider Not In ?No address on file ? ?PRINCIPAL DIAGNOSIS: Local recurrence of non-small cell lung cancer, adenocarcinoma, initially diagnosed as stage IB (T2a N0 M0) in March of 2010.  ? ?PRIOR THERAPY:  ?Status post right lower lobectomy under the care of Dr. Cyndia Bent on 04/08/2009. ?Status post palliative radiotherapy to the right lower lobe recurrent lung mass under the care of Dr. Lisbeth Renshaw, completed on June 08, 2011. ?Systemic chemotherapy with carboplatin for AUC of 5 and Alimta 500 mg/M2 every 3 weeks. She is status post 3 cycles. ? ?CURRENT THERAPY: Tarceva 150 mg by mouth daily, therapy beginning 07/24/2012. Status post approximately 107 months of therapy.  ? ?INTERVAL HISTORY: ?[name] 61 y.o. female returns to the clinic today for follow-up visit accompanied by her aunt.  The patient is feeling fine today with no concerning complaints except for the dry cough and occasional skin rash especially on the eyebrows which is much better now.  She denied having any chest pain, shortness of breath or hemoptysis.  She has no nausea, vomiting, diarrhea or constipation.  She has no headache or visual changes.  She has no other concerning issues in the interval.  She has been tolerating her treatment with Tarceva fairly well.  She is here today for evaluation with repeat CT scan of the chest for restaging of her disease. ? ?MEDICAL HISTORY: ?Past Medical History:  ?Diagnosis Date  ? Drug-induced skin rash 02/27/2017  ? Encounter for antineoplastic chemotherapy 01/19/2016  ? History of radiation therapy 05/02/11 to 06/08/11  ? right lung  ? Lung cancer ([facility])   ? right lower lobe adenocarcinoma  ? ? ?ALLERGIES:  is allergic to codeine and doxycycline. ? ?MEDICATIONS:  ?Current Outpatient Medications  ?Medication Sig Dispense Refill  ? acetaminophen (TYLENOL) 500 MG tablet Take 500 mg every 6 (six) hours as  needed by mouth.    ? albuterol (PROAIR HFA) 108 (90 Base) MCG/ACT inhaler Inhale 1-2 puffs into the lungs every 6 (six) hours as needed for wheezing or shortness of breath. 1 Inhaler 2  ? aspirin EC 81 MG tablet Take 81 mg by mouth daily. Swallow whole. (Patient not taking: Reported on 02/21/2022)    ? chlorpheniramine-HYDROcodone 10-8 MG/5ML Take 5 mLs by mouth every 12 (twelve) hours as needed for cough. 115 mL 0  ? diltiazem (CARDIZEM) 60 MG tablet Take one tablet by mouth at 4pm as needed 30 tablet 1  ? diltiazem (TIADYLT ER) 120 MG 24 hr capsule Take 1 capsule by mouth daily. 90 capsule 2  ? erlotinib (TARCEVA) 150 MG tablet Take 1 tablet (150 mg total) by mouth daily. Take on an empty stomach at least 1 hour before or 2 hours after food. 30 tablet 11  ? lidocaine-prilocaine (EMLA) cream Apply 1 application topically as needed. 30 g 0  ? ?No current facility-administered medications for this visit.  ? ? ?SURGICAL HISTORY:  ?Past Surgical History:  ?Procedure Laterality Date  ? LUNG LOBECTOMY  04/18/2009  ? RLL  ? PORTACATH PLACEMENT  02/29/2012  ? Procedure: INSERTION PORT-A-CATH;  Surgeon: Nicanor Alcon, MD;  Location: [facility];  Service: Thoracic;  Laterality: Left;  PowerPort 8 F attachable in left internal jugular.  ? ? ?REVIEW OF SYSTEMS:  Constitutional: negative ?Eyes: negative ?Ears, nose, mouth, throat, and face: negative ?Respiratory: positive for cough ?Cardiovascular: negative ?Gastrointestinal: negative ?Genitourinary:negative ?Integument/breast:  negative ?Hematologic/lymphatic: negative ?Musculoskeletal:negative ?Neurological: negative ?Behavioral/Psych: negative ?Endocrine: negative ?Allergic/Immunologic: negative  ? ?PHYSICAL EXAMINATION: General appearance: alert, cooperative, fatigued, and no distress ?Head: Normocephalic, without obvious abnormality, atraumatic ?Neck: no adenopathy, no JVD, supple, symmetrical, trachea midline, and thyroid not enlarged, symmetric, no  tenderness/mass/nodules ?Lymph nodes: Cervical, supraclavicular, and axillary nodes normal. ?Resp: clear to auscultation bilaterally ?Back: symmetric, no curvature. ROM normal. No CVA tenderness. ?Cardio: regular rate and rhythm, S1, S2 normal, no murmur, click, rub or gallop ?GI: soft, non-tender; bowel sounds normal; no masses,  no organomegaly ?Extremities: extremities normal, atraumatic, no cyanosis or edema ?Neurologic: Alert and oriented X 3, normal strength and tone. Normal symmetric reflexes. Normal coordination and gait ? ?ECOG PERFORMANCE STATUS: 1 - Symptomatic but completely ambulatory ? ?Blood pressure 131/89, pulse 75, temperature 97.8 ?F (36.6 ?C), temperature source Tympanic, resp. rate 16, weight 121 lb 12.8 oz (55.2 kg), last menstrual period 02/10/2012, SpO2 99 %. ? ?LABORATORY DATA: ?Lab Results  ?Component Value Date  ? WBC 8.4 03/14/2022  ? HGB 11.6 (L) 03/14/2022  ? HCT 36.7 03/14/2022  ? MCV 87.0 03/14/2022  ? PLT 242 03/14/2022  ? ? ?  Chemistry   ?   ?Component Value Date/Time  ? NA 140 03/14/2022 0733  ? NA 143 10/30/2017 1031  ? K 4.3 03/14/2022 0733  ? K 4.3 10/30/2017 1031  ? CL 103 03/14/2022 0733  ? CL 102 04/30/2013 1002  ? CO2 31 03/14/2022 0733  ? CO2 29 10/30/2017 1031  ? BUN 12 03/14/2022 0733  ? BUN 14.6 10/30/2017 1031  ? CREATININE 0.54 03/14/2022 0733  ? CREATININE 0.7 10/30/2017 1031  ?    ?Component Value Date/Time  ? CALCIUM 8.7 (L) 03/14/2022 0733  ? CALCIUM 9.2 10/30/2017 1031  ? ALKPHOS 101 03/14/2022 0733  ? ALKPHOS 103 10/30/2017 1031  ? AST 60 (H) 03/14/2022 0733  ? AST 20 10/30/2017 1031  ? ALT 51 (H) 03/14/2022 0733  ? ALT 14 10/30/2017 1031  ? BILITOT 0.5 03/14/2022 0733  ? BILITOT 0.79 10/30/2017 1031  ?  ? ? ? ?RADIOGRAPHIC STUDIES: ?CT Chest W Contrast ? ?Result Date: 03/14/2022 ?CLINICAL DATA:  Local recurrence non-small cell lung cancer. * Tracking Code: BO * EXAM: CT CHEST WITH CONTRAST TECHNIQUE: Multidetector CT imaging of the chest was performed during  intravenous contrast administration. RADIATION DOSE REDUCTION: This exam was performed according to the departmental dose-optimization program which includes automated exposure control, adjustment of the mA and/or kV according to patient size and/or use of iterative reconstruction technique. CONTRAST:  40mL OMNIPAQUE IOHEXOL 300 MG/ML  SOLN COMPARISON:  09/21/2021. FINDINGS: Cardiovascular: Left IJ Port-A-Cath terminates in the low SVC. Atherosclerotic calcification of the aorta, aortic valve and coronary arteries. Heart is at the upper limits of normal in size. No pericardial effusion. Mediastinum/Nodes: Mildly hypodense right thyroid nodule measures approximately 1.8 cm. No pathologically enlarged mediastinal, hilar or axillary lymph nodes. Prepericardiac subcentimeter lymph node (2/89), unchanged. Lungs/Pleura: Right lower lobectomy post radiation scarring in the right perihilar region. Rounded low-attenuation mass in posteromedial right hemithorax is stable, measuring 2.0 x 3.3 cm (2/66). Chronic right fibrothorax. Previously seen areas of rounded nodular low-attenuation consolidation in the perihilar and posterior right lung have nearly completely resolved in the interval (5/49 and 57). Left lung is clear. No left pleural fluid. Airway is otherwise unremarkable. Upper Abdomen: Visualized portions of the liver, gallbladder, adrenal glands, kidneys, spleen, pancreas, stomach and bowel are grossly unremarkable. Musculoskeletal: Degenerative changes in the spine. Lytic destruction and  pathologic fractures involving the posterior right sixth and seventh ribs. Additional cortical regularity involving the posterior aspect of the right eighth rib. Findings are likely treatment related. No new worrisome lytic or sclerotic lesions. IMPRESSION: 1. Previous seen areas of nodular consolidation in the right lung have nearly completely resolved in the interval, suggesting treatment response. 2. Post radiation scarring,  rounded low-attenuation nodular consolidation and fibrothorax in the posteromedial right hemithorax, similar. 3. Mildly hypodense right thyroid nodule. Recommend thyroid ultrasound. (Ref: J Am Coll Radiol. 2015 Feb;12(2): 143-50). 4. Aortic at

## 2022-04-02 DIAGNOSIS — Z20822 Contact with and (suspected) exposure to covid-19: Secondary | ICD-10-CM | POA: Diagnosis not present

## 2022-04-04 DIAGNOSIS — Z20822 Contact with and (suspected) exposure to covid-19: Secondary | ICD-10-CM | POA: Diagnosis not present

## 2022-04-15 ENCOUNTER — Other Ambulatory Visit: Payer: Self-pay

## 2022-04-15 DIAGNOSIS — C3431 Malignant neoplasm of lower lobe, right bronchus or lung: Secondary | ICD-10-CM

## 2022-04-15 DIAGNOSIS — C3491 Malignant neoplasm of unspecified part of right bronchus or lung: Secondary | ICD-10-CM

## 2022-04-15 DIAGNOSIS — Z95828 Presence of other vascular implants and grafts: Secondary | ICD-10-CM

## 2022-04-15 MED ORDER — ERLOTINIB HCL 150 MG PO TABS: 150.0000 mg | ORAL_TABLET | Freq: Every day | ORAL | 11 refills | Status: DC

## 2022-04-19 ENCOUNTER — Telehealth: Payer: Self-pay | Admitting: Medical Oncology

## 2022-04-19 NOTE — Telephone Encounter (Signed)
Tarceva refill- son called and said she needed refills . Order was received on 04/15/2022, I called in tarceva to [facility].

## 2022-04-21 ENCOUNTER — Other Ambulatory Visit: Payer: Self-pay | Admitting: Internal Medicine

## 2022-04-21 ENCOUNTER — Telehealth: Payer: Self-pay

## 2022-04-21 MED ORDER — HYDROCOD POLI-CHLORPHE POLI ER 10-8 MG/5ML PO SUER: 5.0000 mL | Freq: Two times a day (BID) | ORAL | 0 refills | Status: DC | PRN

## 2022-04-21 NOTE — Telephone Encounter (Signed)
Pts sone called requesting a refill of chlorpheniramine-HYDROcodone 10-8 MG/5ML

## 2022-05-07 ENCOUNTER — Emergency Department (HOSPITAL_COMMUNITY): Payer: Medicare Other

## 2022-05-07 ENCOUNTER — Encounter (HOSPITAL_COMMUNITY): Payer: Self-pay

## 2022-05-07 ENCOUNTER — Other Ambulatory Visit: Payer: Self-pay

## 2022-05-07 ENCOUNTER — Emergency Department (HOSPITAL_COMMUNITY)
Admission: EM | Admit: 2022-05-07 | Discharge: 2022-05-07 | Disposition: A | Discharge status: Home / Self Care | Payer: Medicare Other | Attending: Emergency Medicine | Admitting: Emergency Medicine

## 2022-05-07 DIAGNOSIS — Z85118 Personal history of other malignant neoplasm of bronchus and lung: Secondary | ICD-10-CM | POA: Insufficient documentation

## 2022-05-07 DIAGNOSIS — Z7982 Long term (current) use of aspirin: Secondary | ICD-10-CM | POA: Diagnosis not present

## 2022-05-07 DIAGNOSIS — R051 Acute cough: Secondary | ICD-10-CM | POA: Insufficient documentation

## 2022-05-07 DIAGNOSIS — C3490 Malignant neoplasm of unspecified part of unspecified bronchus or lung: Secondary | ICD-10-CM | POA: Diagnosis not present

## 2022-05-07 DIAGNOSIS — R059 Cough, unspecified: Secondary | ICD-10-CM | POA: Diagnosis not present

## 2022-05-07 DIAGNOSIS — J90 Pleural effusion, not elsewhere classified: Secondary | ICD-10-CM | POA: Diagnosis not present

## 2022-05-07 MED ORDER — HYDROCOD POLI-CHLORPHE POLI ER 10-8 MG/5ML PO SUER: 5.0000 mL | Freq: Two times a day (BID) | ORAL | 0 refills | Status: DC | PRN

## 2022-05-07 NOTE — ED Provider Notes (Signed)
[facility] DEPT Provider Note   CSN: [id number] Arrival date & time: 05/07/22  1959     History  Chief Complaint  Patient presents with   Cough    [name] is a 61 y.o. female with a past medical history of lung cancer who presents to the emergency department complaining of intermittent cough onset 2 months.  Currently on chemo pills.  Denies chest pain, shortness of breath at this time.  Patient has associated sore throat due to the cough.  No meds tried prior to arrival.  Notes that the narcotic prescription that she was getting from her oncologist alleviated her symptoms however she has run out of the prescription today.  Requesting a refill.  The history is provided by the patient. A language interpreter was used Field seismologist).       Home Medications Prior to Admission medications   Medication Sig Start Date End Date Taking? Authorizing Provider  acetaminophen (TYLENOL) 500 MG tablet Take 500 mg every 6 (six) hours as needed by mouth.    [provider]  albuterol (PROAIR HFA) 108 (90 Base) MCG/ACT inhaler Inhale 1-2 puffs into the lungs every 6 (six) hours as needed for wheezing or shortness of breath. 12/24/18   Curt Bears, MD  aspirin EC 81 MG tablet Take 81 mg by mouth daily. Swallow whole. Patient not taking: Reported on 02/21/2022    [provider]  chlorpheniramine-HYDROcodone 10-8 MG/5ML Take 5 mLs by mouth every 12 (twelve) hours as needed for cough. 05/07/22   Donney Caraveo A, PA-C  diltiazem (CARDIZEM) 60 MG tablet Take one tablet by mouth at 4pm as needed 12/06/21   Deboraha Sprang, MD  diltiazem (TIADYLT ER) 120 MG 24 hr capsule Take 1 capsule by mouth daily. 11/16/21   Deboraha Sprang, MD  erlotinib (TARCEVA) 150 MG tablet Take 1 tablet (150 mg total) by mouth daily. Take on an empty stomach at least 1 hour before or 2 hours after food. 04/15/22   Curt Bears, MD  lidocaine-prilocaine (EMLA) cream Apply 1  application topically as needed. 04/30/18   Curt Bears, MD      Allergies    Codeine and Doxycycline    Review of Systems   Review of Systems  Constitutional:  Negative for chills and fever.  HENT:  Positive for sore throat (Secondary to cough).   Respiratory:  Positive for cough. Negative for shortness of breath.   Cardiovascular:  Negative for chest pain.  Gastrointestinal:  Negative for nausea and vomiting.  All other systems reviewed and are negative.   Physical Exam Updated Vital Signs BP (!) 159/88 (BP Location: Left Arm)   Pulse 98   Temp 98.3 F (36.8 C) (Oral)   Resp 20   Ht 4\' 9"  (1.448 m)   Wt 54.4 kg   LMP 02/10/2012   SpO2 95%   BMI 25.97 kg/m  Physical Exam Vitals and nursing note reviewed.  Constitutional:      General: She is not in acute distress.    Appearance: She is not diaphoretic.  HENT:     Head: Normocephalic and atraumatic.     Mouth/Throat:     Mouth: Mucous membranes are moist.     Pharynx: Oropharynx is clear. Uvula midline. No oropharyngeal exudate, posterior oropharyngeal erythema or uvula swelling.     Tonsils: No tonsillar exudate.     Comments: Uvula midline without swelling.  No posterior pharyngeal erythema no tonsillar exudate noted. Eyes:  General: No scleral icterus.    Conjunctiva/sclera: Conjunctivae normal.  Cardiovascular:     Rate and Rhythm: Normal rate and regular rhythm.     Pulses: Normal pulses.     Heart sounds: Normal heart sounds.  Pulmonary:     Effort: Pulmonary effort is normal. No respiratory distress.     Breath sounds: Normal breath sounds. No wheezing.  Abdominal:     General: Bowel sounds are normal.     Palpations: Abdomen is soft. There is no mass.     Tenderness: There is no abdominal tenderness. There is no guarding or rebound.  Musculoskeletal:        General: Normal range of motion.     Cervical back: Normal range of motion and neck supple.  Skin:    General: Skin is warm and dry.   Neurological:     Mental Status: She is alert.  Psychiatric:        Behavior: Behavior normal.     ED Results / Procedures / Treatments   Labs (all labs ordered are listed, but only abnormal results are displayed) Labs Reviewed - No data to display  EKG None  Radiology DG Chest 2 View  Result Date: 05/07/2022 CLINICAL DATA:  Cough, lung cancer EXAM: CHEST - 2 VIEW COMPARISON:  09/17/2021, 01/15/2019 FINDINGS: Stable right-sided volume loss and moderate right pleural effusion. Left lung is clear. No pneumothorax. No pleural effusion on the left. Right infrahilar soft tissue density with abrupt cut off of the right mainstem bronchus is unchanged, representing the soft tissue mass better visualized on CT examination of 03/14/2022. Cardiac size within normal limits. Left subclavian chest port is unchanged with its tip within the superior vena cava. No acute bone abnormality. Healed left clavicle fracture again noted. IMPRESSION: Stable examination with left infrahilar soft tissue mass with abrupt cut off of the right mainstem bronchus, right-sided volume loss, and moderate right pleural effusion. This was better evaluated on CT examination of 03/14/2022. No radiographic evidence of acute cardiopulmonary disease. Electronically Signed   By: Fidela Salisbury M.D.   On: 05/07/2022 21:27    Procedures Procedures    Medications Ordered in ED Medications - No data to display  ED Course/ Medical Decision Making/ A&P                           Medical Decision Making Amount and/or Complexity of Data Reviewed Radiology: ordered.  Risk Prescription drug management.   Pt presents with intermittent cough onset 2 months.  Patient currently receiving chemo pills for lung cancer by Dr. Julien Nordmann. Vital signs, stable, patient afebrile. On exam, pt with no acute cardiovascular, respiratory, abdominal exam findings. Differential diagnosis includes pneumonia, viral URI with cough, pneumothorax.     Additional history obtained:  Additional history obtained from Daughter/Son External records reviewed and noted that patient was evaluated by her oncologist in April 2023.  Patient had a CT of her chest completed in April 2023  Imaging: I ordered imaging studies including CXR I independently visualized and interpreted imaging which showed:  Stable examination with left infrahilar soft tissue mass with abrupt  cut off of the right mainstem bronchus, right-sided volume loss, and  moderate right pleural effusion. This was better evaluated on CT  examination of 03/14/2022. No radiographic evidence of acute  cardiopulmonary disease.   I agree with the radiologist interpretation  Disposition: Presentation suspicious for cough. Doubt pneumonia or PTX at this time. After consideration of  the diagnostic results and the patients response to treatment, I feel that the patient would benefit from Discharge home.  Patient will be discharged home and sent with a short course of the chlorpheniramine- hydrocodone syrup that she was previously prescribed by her oncologist.  Discussed with patient importance of following up with her oncologist regarding today's ED visit.  Supportive care measures and strict return precautions discussed with patient at bedside. Pt acknowledges and verbalizes understanding. Pt appears safe for discharge. Follow up as indicated in discharge paperwork.    This chart was dictated using voice recognition software, Dragon. Despite the best efforts of this provider to proofread and correct errors, errors may still occur which can change documentation meaning.   Final Clinical Impression(s) / ED Diagnoses Final diagnoses:  Acute cough    Rx / DC Orders ED Discharge Orders          Ordered    chlorpheniramine-HYDROcodone 10-8 MG/5ML  Every 12 hours PRN        05/07/22 2254              Draken Farrior A, PA-C 05/08/22 0016    Valarie Merino, MD 05/08/22  2310

## 2022-05-07 NOTE — Discharge Instructions (Signed)
It was a pleasure taking care of you today!   Your imaging didn't show any pneumonia. You will be sent a short course of your cough medication. It is important, that you call Dr. Ellan Lambert office Monday to set up a follow up appointment regarding todays ED visit. Ensure to maintain fluid intake with tea, broth, soup, water. Return to the ED if you are experiencing increasing/worsening cough, chest pain, trouble breathing, or worsening symptoms.

## 2022-05-07 NOTE — ED Triage Notes (Signed)
Ambulatory to ED with c/o cough x 2 months. Hx of lung cancer, currently on chemo pills. Denis shortness of breath or chest pain. Per son, cough started after pt received radiation and was told "something went wrong" and now she's just coughing and can't sleep d/t it.

## 2022-05-09 ENCOUNTER — Telehealth: Payer: Self-pay | Admitting: Medical Oncology

## 2022-05-09 ENCOUNTER — Other Ambulatory Visit: Payer: Self-pay | Admitting: Internal Medicine

## 2022-05-09 MED ORDER — HYDROCOD POLI-CHLORPHE POLI ER 10-8 MG/5ML PO SUER: 5.0000 mL | Freq: Two times a day (BID) | ORAL | 0 refills | Status: DC | PRN

## 2022-05-09 NOTE — Telephone Encounter (Signed)
Refill Hydrocodone cough med. . Pt got a 7 day supply 3 days ago . She will need refill before the weekend.

## 2022-05-10 ENCOUNTER — Encounter: Payer: Self-pay | Admitting: Internal Medicine

## 2022-05-30 ENCOUNTER — Other Ambulatory Visit: Payer: Self-pay | Admitting: Physician Assistant

## 2022-05-30 ENCOUNTER — Telehealth: Payer: Self-pay | Admitting: Medical Oncology

## 2022-05-30 MED ORDER — HYDROCOD POLI-CHLORPHE POLI ER 10-8 MG/5ML PO SUER: 5.0000 mL | Freq: Two times a day (BID) | ORAL | 0 refills | Status: DC | PRN

## 2022-05-30 NOTE — Telephone Encounter (Signed)
Requested refill -Hydrocodone cough syrup.

## 2022-06-08 ENCOUNTER — Telehealth: Payer: Self-pay | Admitting: Medical Oncology

## 2022-06-08 ENCOUNTER — Other Ambulatory Visit: Payer: Self-pay | Admitting: Physician Assistant

## 2022-06-08 DIAGNOSIS — C3431 Malignant neoplasm of lower lobe, right bronchus or lung: Secondary | ICD-10-CM

## 2022-06-08 MED ORDER — HYDROCOD POLI-CHLORPHE POLI ER 10-8 MG/5ML PO SUER: 5.0000 mL | Freq: Two times a day (BID) | ORAL | 0 refills | Status: DC | PRN

## 2022-06-08 NOTE — Telephone Encounter (Signed)
Chlorpheniramine-hydrocodone out of stock  Cough medication is out of stock at [facility].   Please send new rx to [facility] [address].

## 2022-06-20 ENCOUNTER — Encounter: Payer: Self-pay | Admitting: Internal Medicine

## 2022-06-20 ENCOUNTER — Inpatient Hospital Stay: Payer: Medicare Other

## 2022-06-20 ENCOUNTER — Other Ambulatory Visit: Payer: Self-pay

## 2022-06-20 ENCOUNTER — Inpatient Hospital Stay: Payer: Medicare Other | Attending: Internal Medicine | Admitting: Internal Medicine

## 2022-06-20 VITALS — BP 125/86 | HR 95 | Temp 98.0°F | Resp 19 | Ht <= 58 in | Wt 121.8 lb

## 2022-06-20 DIAGNOSIS — Z95828 Presence of other vascular implants and grafts: Secondary | ICD-10-CM

## 2022-06-20 DIAGNOSIS — Z5111 Encounter for antineoplastic chemotherapy: Secondary | ICD-10-CM | POA: Diagnosis not present

## 2022-06-20 DIAGNOSIS — C3490 Malignant neoplasm of unspecified part of unspecified bronchus or lung: Secondary | ICD-10-CM | POA: Diagnosis not present

## 2022-06-20 DIAGNOSIS — Z923 Personal history of irradiation: Secondary | ICD-10-CM | POA: Insufficient documentation

## 2022-06-20 DIAGNOSIS — C3431 Malignant neoplasm of lower lobe, right bronchus or lung: Secondary | ICD-10-CM

## 2022-06-20 DIAGNOSIS — Z9221 Personal history of antineoplastic chemotherapy: Secondary | ICD-10-CM | POA: Insufficient documentation

## 2022-06-20 DIAGNOSIS — Z85118 Personal history of other malignant neoplasm of bronchus and lung: Secondary | ICD-10-CM | POA: Insufficient documentation

## 2022-06-20 LAB — CMP (CANCER CENTER ONLY)
ALT: 15 U/L (ref 0–44)
AST: 21 U/L (ref 15–41)
Albumin: 4.3 g/dL (ref 3.5–5.0)
Alkaline Phosphatase: 72 U/L (ref 38–126)
Anion gap: 6 (ref 5–15)
BUN: 9 mg/dL (ref 6–20)
CO2: 29 mmol/L (ref 22–32)
Calcium: 8.8 mg/dL — ABNORMAL LOW (ref 8.9–10.3)
Chloride: 106 mmol/L (ref 98–111)
Creatinine: 0.58 mg/dL (ref 0.44–1.00)
GFR, Estimated: >60 mL/min (ref >60)
Glucose, Bld: 102 mg/dL — ABNORMAL HIGH (ref 70–99)
Potassium: 3.9 mmol/L (ref 3.5–5.1)
Sodium: 141 mmol/L (ref 135–145)
Total Bilirubin: 0.7 mg/dL (ref 0.3–1.2)
Total Protein: 7.3 g/dL (ref 6.5–8.1)

## 2022-06-20 LAB — CBC WITH DIFFERENTIAL (CANCER CENTER ONLY)
Abs Immature Granulocytes: 0.01 10*3/uL (ref 0.00–0.07)
Basophils Absolute: 0 10*3/uL (ref 0.0–0.1)
Basophils Relative: 0 %
Eosinophils Absolute: 0.1 10*3/uL (ref 0.0–0.5)
Eosinophils Relative: 1 %
HCT: 40.8 % (ref 36.0–46.0)
Hemoglobin: 13.1 g/dL (ref 12.0–15.0)
Immature Granulocytes: 0 %
Lymphocytes Relative: 24 %
Lymphs Abs: 1.6 10*3/uL (ref 0.7–4.0)
MCH: 28.4 pg (ref 26.0–34.0)
MCHC: 32.1 g/dL (ref 30.0–36.0)
MCV: 88.5 fL (ref 80.0–100.0)
Monocytes Absolute: 0.5 10*3/uL (ref 0.1–1.0)
Monocytes Relative: 8 %
Neutro Abs: 4.3 10*3/uL (ref 1.7–7.7)
Neutrophils Relative %: 67 %
Platelet Count: 228 10*3/uL (ref 150–400)
RBC: 4.61 MIL/uL (ref 3.87–5.11)
RDW: 13.7 % (ref 11.5–15.5)
WBC Count: 6.5 10*3/uL (ref 4.0–10.5)
nRBC: 0 % (ref 0.0–0.2)

## 2022-06-20 MED ORDER — HEPARIN SOD (PORK) LOCK FLUSH 100 UNIT/ML IV SOLN
500.0000 [IU] | Freq: Once | INTRAVENOUS | Status: AC | PRN
  Administered 2022-06-20: 500 [IU] via INTRAVENOUS

## 2022-06-20 MED ORDER — HYDROCOD POLI-CHLORPHE POLI ER 10-8 MG/5ML PO SUER: 5.0000 mL | Freq: Two times a day (BID) | ORAL | 0 refills | Status: DC | PRN

## 2022-06-20 MED ORDER — SODIUM CHLORIDE 0.9% FLUSH
10.0000 mL | INTRAVENOUS | Status: DC | PRN
  Administered 2022-06-20: 10 mL via INTRAVENOUS

## 2022-06-20 NOTE — Progress Notes (Signed)
#        [place] [phone]   Fax:(336) [place], [place], MD [place] [address]  PRINCIPAL DIAGNOSIS: Local recurrence of non-small cell lung cancer, adenocarcinoma, initially diagnosed as stage IB (T2a N0 M0) in March of 2010.   PRIOR THERAPY:  Status post right lower lobectomy under the care of Dr. Cyndia Bent on 04/08/2009. Status post palliative radiotherapy to the right lower lobe recurrent lung mass under the care of Dr. Lisbeth Renshaw, completed on June 08, 2011. Systemic chemotherapy with carboplatin for AUC of 5 and Alimta 500 mg/M2 every 3 weeks. She is status post 3 cycles.  CURRENT THERAPY: Tarceva 150 mg by mouth daily, therapy beginning 07/24/2012. Status post approximately 110 months of therapy.   INTERVAL HISTORY: [name] 61 y.o. female returns to the clinic today for follow-up visit accompanied by her [place] interpreter.  The patient is feeling fine today with no concerning complaints except for the few episodes of diarrhea and mild skin rash especially in the eyebrows.  She also continues to have dry cough and requesting refill of her cough medication.  She has no nausea, vomiting, abdominal pain or constipation.  She has no chest pain, shortness of breath or hemoptysis.  She has no weight loss or night sweats.  She continues to tolerate her treatment with Tarceva fairly well.  She is here today for evaluation and repeat blood work.  MEDICAL HISTORY: Past Medical History:  Diagnosis Date   Drug-induced skin rash 02/27/2017   Encounter for antineoplastic chemotherapy 01/19/2016   History of radiation therapy 05/02/11 to 06/08/11   right lung   Lung cancer ([place])    right lower lobe adenocarcinoma    ALLERGIES:  is allergic to codeine and doxycycline.  MEDICATIONS:  Current Outpatient Medications  Medication Sig Dispense Refill   acetaminophen (TYLENOL) 500 MG tablet Take 500 mg every 6  (six) hours as needed by mouth.     albuterol (PROAIR HFA) 108 (90 Base) MCG/ACT inhaler Inhale 1-2 puffs into the lungs every 6 (six) hours as needed for wheezing or shortness of breath. 1 Inhaler 2   aspirin EC 81 MG tablet Take 81 mg by mouth daily. Swallow whole. (Patient not taking: Reported on 02/21/2022)     chlorpheniramine-HYDROcodone 10-8 MG/5ML Take 5 mLs by mouth every 12 (twelve) hours as needed for cough. 115 mL 0   diltiazem (CARDIZEM) 60 MG tablet Take one tablet by mouth at 4pm as needed 30 tablet 1   diltiazem (TIADYLT ER) 120 MG 24 hr capsule Take 1 capsule by mouth daily. 90 capsule 2   erlotinib (TARCEVA) 150 MG tablet Take 1 tablet (150 mg total) by mouth daily. Take on an empty stomach at least 1 hour before or 2 hours after food. 30 tablet 11   lidocaine-prilocaine (EMLA) cream Apply 1 application topically as needed. 30 g 0   No current facility-administered medications for this visit.   Facility-Administered Medications Ordered in Other Visits  Medication Dose Route Frequency Provider Last Rate Last Admin   sodium chloride flush (NS) 0.9 % injection 10 mL  10 mL Intravenous PRN Curt Bears, MD   10 mL at 06/20/22 1025    SURGICAL HISTORY:  Past Surgical History:  Procedure Laterality Date   LUNG LOBECTOMY  04/18/2009   RLL   PORTACATH PLACEMENT  02/29/2012   Procedure: INSERTION PORT-A-CATH;  Surgeon: Nicanor Alcon, MD;  Location: [facility];  Service: Thoracic;  Laterality: Left;  PowerPort 8 F attachable in left internal jugular.    REVIEW OF SYSTEMS:  A comprehensive review of systems was negative except for: Respiratory: positive for cough Gastrointestinal: positive for diarrhea Integument/breast: positive for rash   PHYSICAL EXAMINATION: General appearance: alert, cooperative, and no distress Head: Normocephalic, without obvious abnormality, atraumatic Neck: no adenopathy, no JVD, supple, symmetrical, trachea midline, and thyroid not enlarged, symmetric, no  tenderness/mass/nodules Lymph nodes: Cervical, supraclavicular, and axillary nodes normal. Resp: clear to auscultation bilaterally Back: symmetric, no curvature. ROM normal. No CVA tenderness. Cardio: regular rate and rhythm, S1, S2 normal, no murmur, click, rub or gallop GI: soft, non-tender; bowel sounds normal; no masses,  no organomegaly Extremities: extremities normal, atraumatic, no cyanosis or edema  ECOG PERFORMANCE STATUS: 1 - Symptomatic but completely ambulatory  Blood pressure 125/86, pulse 95, temperature 98 F (36.7 C), temperature source Oral, resp. rate 19, height 4\' 9"  (1.448 m), weight 121 lb 12.8 oz (55.2 kg), last menstrual period 02/10/2012, SpO2 100 %.  LABORATORY DATA: Lab Results  Component Value Date   WBC 8.4 03/14/2022   HGB 11.6 (L) 03/14/2022   HCT 36.7 03/14/2022   MCV 87.0 03/14/2022   PLT 242 03/14/2022      Chemistry      Component Value Date/Time   NA 140 03/14/2022 0733   NA 143 10/30/2017 1031   K 4.3 03/14/2022 0733   K 4.3 10/30/2017 1031   CL 103 03/14/2022 0733   CL 102 04/30/2013 1002   CO2 31 03/14/2022 0733   CO2 29 10/30/2017 1031   BUN 12 03/14/2022 0733   BUN 14.6 10/30/2017 1031   CREATININE 0.54 03/14/2022 0733   CREATININE 0.7 10/30/2017 1031      Component Value Date/Time   CALCIUM 8.7 (L) 03/14/2022 0733   CALCIUM 9.2 10/30/2017 1031   ALKPHOS 101 03/14/2022 0733   ALKPHOS 103 10/30/2017 1031   AST 60 (H) 03/14/2022 0733   AST 20 10/30/2017 1031   ALT 51 (H) 03/14/2022 0733   ALT 14 10/30/2017 1031   BILITOT 0.5 03/14/2022 0733   BILITOT 0.79 10/30/2017 1031       RADIOGRAPHIC STUDIES: No results found.   ASSESSMENT AND PLAN:  This is a very pleasant 61 years old Asian female with recurrent non-small cell lung cancer, adenocarcinoma status post surgical resection followed by systemic chemotherapy with carboplatin and Alimta followed by disease progression and the patient was started on treatment with Tarceva  150 mg by mouth daily status post 110 months  The patient has been tolerating this treatment well with no concerning adverse effect except for mild skin rash, few episodes of diarrhea.  She also has persistent dry cough from her disease. I recommended for her to continue her current treatment with Tarceva with the same dose. I will see her back for follow-up visit in 3 months with repeat CT scan of the chest for restaging of her disease. For the dry cough the patient will have refill of Tussionex. For the skin rash, she will continue to apply the clindamycin lotion on as-needed basis. The patient was advised to call immediately if she has any concerning symptoms in the interval.  All questions were answered. The patient knows to call the clinic with any problems, questions or concerns. We can certainly see the patient much sooner if necessary.  Disclaimer: This note was dictated with voice recognition software. Similar sounding words can inadvertently be transcribed and may not be  corrected upon review.

## 2022-09-08 ENCOUNTER — Telehealth: Payer: Self-pay | Admitting: Medical Oncology

## 2022-09-08 NOTE — Telephone Encounter (Signed)
Requested refill for Hydrocodone for her cough

## 2022-09-09 ENCOUNTER — Other Ambulatory Visit: Payer: Self-pay | Admitting: Internal Medicine

## 2022-09-09 DIAGNOSIS — C3431 Malignant neoplasm of lower lobe, right bronchus or lung: Secondary | ICD-10-CM

## 2022-09-09 MED ORDER — HYDROCOD POLI-CHLORPHE POLI ER 10-8 MG/5ML PO SUER: 5.0000 mL | Freq: Two times a day (BID) | ORAL | 0 refills | Status: DC | PRN

## 2022-09-12 ENCOUNTER — Other Ambulatory Visit (HOSPITAL_COMMUNITY): Payer: Medicare Other

## 2022-09-13 ENCOUNTER — Other Ambulatory Visit: Payer: Self-pay | Admitting: Internal Medicine

## 2022-09-13 ENCOUNTER — Other Ambulatory Visit: Payer: Self-pay

## 2022-09-13 DIAGNOSIS — C3431 Malignant neoplasm of lower lobe, right bronchus or lung: Secondary | ICD-10-CM

## 2022-09-13 MED ORDER — HYDROCOD POLI-CHLORPHE POLI ER 10-8 MG/5ML PO SUER: 5.0000 mL | Freq: Two times a day (BID) | ORAL | 0 refills | Status: DC | PRN

## 2022-09-13 NOTE — Telephone Encounter (Signed)
This nurse spoke with patients son who stated that refill for Tussionex was submitted on [facility]/09/[facility] [address] [facility] [place] however they are out of stock.  Request [facility] to [facility] a [facility].  [facility] [address] and verified that they had medication in stock and sent refill request to provider for approval.  Son has been made aware once MD signs electronically the pharmacy should call when it is ready for pick up.  No further questions or concerns noted at this time.

## 2022-09-19 ENCOUNTER — Encounter (HOSPITAL_COMMUNITY): Payer: Self-pay

## 2022-09-19 ENCOUNTER — Ambulatory Visit (HOSPITAL_COMMUNITY)
Admission: RE | Admit: 2022-09-19 | Discharge: 2022-09-19 | Disposition: A | Discharge status: Home / Self Care | Payer: Medicare Other | Source: Ambulatory Visit | Attending: Internal Medicine | Admitting: Internal Medicine

## 2022-09-19 ENCOUNTER — Inpatient Hospital Stay: Payer: Medicare Other | Attending: Internal Medicine

## 2022-09-19 ENCOUNTER — Encounter: Payer: Self-pay | Admitting: Internal Medicine

## 2022-09-19 DIAGNOSIS — C3431 Malignant neoplasm of lower lobe, right bronchus or lung: Secondary | ICD-10-CM | POA: Insufficient documentation

## 2022-09-19 DIAGNOSIS — R918 Other nonspecific abnormal finding of lung field: Secondary | ICD-10-CM | POA: Diagnosis not present

## 2022-09-19 DIAGNOSIS — Z923 Personal history of irradiation: Secondary | ICD-10-CM | POA: Diagnosis not present

## 2022-09-19 DIAGNOSIS — R058 Other specified cough: Secondary | ICD-10-CM | POA: Insufficient documentation

## 2022-09-19 DIAGNOSIS — Z23 Encounter for immunization: Secondary | ICD-10-CM | POA: Diagnosis not present

## 2022-09-19 DIAGNOSIS — C3490 Malignant neoplasm of unspecified part of unspecified bronchus or lung: Secondary | ICD-10-CM

## 2022-09-19 DIAGNOSIS — Z79899 Other long term (current) drug therapy: Secondary | ICD-10-CM | POA: Insufficient documentation

## 2022-09-19 LAB — CMP (CANCER CENTER ONLY)
ALT: 15 U/L (ref 0–44)
AST: 22 U/L (ref 15–41)
Albumin: 4.1 g/dL (ref 3.5–5.0)
Alkaline Phosphatase: 103 U/L (ref 38–126)
Anion gap: 7 (ref 5–15)
BUN: 13 mg/dL (ref 6–20)
CO2: 28 mmol/L (ref 22–32)
Calcium: 8.7 mg/dL — ABNORMAL LOW (ref 8.9–10.3)
Chloride: 104 mmol/L (ref 98–111)
Creatinine: 0.56 mg/dL (ref 0.44–1.00)
GFR, Estimated: >60 mL/min (ref >60)
Glucose, Bld: 114 mg/dL — ABNORMAL HIGH (ref 70–99)
Potassium: 4 mmol/L (ref 3.5–5.1)
Sodium: 139 mmol/L (ref 135–145)
Total Bilirubin: 0.3 mg/dL (ref 0.3–1.2)
Total Protein: 7.3 g/dL (ref 6.5–8.1)

## 2022-09-19 LAB — CBC WITH DIFFERENTIAL (CANCER CENTER ONLY)
Abs Immature Granulocytes: 0.01 10*3/uL (ref 0.00–0.07)
Basophils Absolute: 0 10*3/uL (ref 0.0–0.1)
Basophils Relative: 0 %
Eosinophils Absolute: 0.1 10*3/uL (ref 0.0–0.5)
Eosinophils Relative: 1 %
HCT: 39.3 % (ref 36.0–46.0)
Hemoglobin: 13 g/dL (ref 12.0–15.0)
Immature Granulocytes: 0 %
Lymphocytes Relative: 27 %
Lymphs Abs: 2.1 10*3/uL (ref 0.7–4.0)
MCH: 28.8 pg (ref 26.0–34.0)
MCHC: 33.1 g/dL (ref 30.0–36.0)
MCV: 86.9 fL (ref 80.0–100.0)
Monocytes Absolute: 0.6 10*3/uL (ref 0.1–1.0)
Monocytes Relative: 8 %
Neutro Abs: 5 10*3/uL (ref 1.7–7.7)
Neutrophils Relative %: 64 %
Platelet Count: 235 10*3/uL (ref 150–400)
RBC: 4.52 MIL/uL (ref 3.87–5.11)
RDW: 14 % (ref 11.5–15.5)
WBC Count: 7.8 10*3/uL (ref 4.0–10.5)
nRBC: 0 % (ref 0.0–0.2)

## 2022-09-19 MED ORDER — HEPARIN SOD (PORK) LOCK FLUSH 100 UNIT/ML IV SOLN
INTRAVENOUS | Status: AC
  Administered 2022-09-19: 500 [IU]
  Filled 2022-09-19: qty 5

## 2022-09-19 MED ORDER — SODIUM CHLORIDE (PF) 0.9 % IJ SOLN
INTRAMUSCULAR | Status: AC
  Filled 2022-09-19: qty 50

## 2022-09-19 MED ORDER — HEPARIN SOD (PORK) LOCK FLUSH 100 UNIT/ML IV SOLN
INTRAVENOUS | Status: AC
  Filled 2022-09-19: qty 5

## 2022-09-19 MED ORDER — IOHEXOL 300 MG/ML  SOLN
75.0000 mL | Freq: Once | INTRAMUSCULAR | Status: AC | PRN
  Administered 2022-09-19: 75 mL via INTRAVENOUS

## 2022-09-21 ENCOUNTER — Telehealth: Payer: Self-pay | Admitting: Internal Medicine

## 2022-09-21 MED ORDER — DILTIAZEM HCL ER BEADS 120 MG PO CP24: 120.0000 mg | ORAL_CAPSULE | Freq: Every day | ORAL | 0 refills | Status: DC

## 2022-09-21 NOTE — Telephone Encounter (Signed)
*  STAT* If patient is at the pharmacy, call can be transferred to refill team.   1. Which medications need to be refilled? (please list name of each medication and dose if known) diltiazem (TIADYLT ER) 120 MG 24 hr capsule  2. Which pharmacy/location (including street and city if local pharmacy) is medication to be sent to? [id number] [place] [address] [facility] [address].  3. Do they need a 30 day or 90 day supply? [place]

## 2022-09-21 NOTE — Telephone Encounter (Signed)
Pt's medication was sent to pt's pharmacy as requested. Confirmation received.  °

## 2022-09-26 ENCOUNTER — Other Ambulatory Visit: Payer: Self-pay

## 2022-09-26 ENCOUNTER — Other Ambulatory Visit: Payer: Self-pay | Admitting: Medical Oncology

## 2022-09-26 ENCOUNTER — Inpatient Hospital Stay: Payer: Medicare Other

## 2022-09-26 ENCOUNTER — Ambulatory Visit: Payer: Medicare Other | Admitting: Internal Medicine

## 2022-09-26 ENCOUNTER — Inpatient Hospital Stay (HOSPITAL_BASED_OUTPATIENT_CLINIC_OR_DEPARTMENT_OTHER): Payer: Medicare Other | Admitting: Internal Medicine

## 2022-09-26 VITALS — BP 156/73 | HR 74 | Temp 98.3°F | Resp 18 | Wt 124.2 lb

## 2022-09-26 DIAGNOSIS — Z23 Encounter for immunization: Secondary | ICD-10-CM | POA: Diagnosis not present

## 2022-09-26 DIAGNOSIS — C3431 Malignant neoplasm of lower lobe, right bronchus or lung: Secondary | ICD-10-CM | POA: Diagnosis not present

## 2022-09-26 DIAGNOSIS — Z923 Personal history of irradiation: Secondary | ICD-10-CM | POA: Diagnosis not present

## 2022-09-26 DIAGNOSIS — Z79899 Other long term (current) drug therapy: Secondary | ICD-10-CM | POA: Diagnosis not present

## 2022-09-26 DIAGNOSIS — R058 Other specified cough: Secondary | ICD-10-CM | POA: Diagnosis not present

## 2022-09-26 MED ORDER — CLINDAMYCIN PHOSPHATE 1 % EX LOTN: TOPICAL_LOTION | Freq: Two times a day (BID) | CUTANEOUS | 0 refills | Status: DC

## 2022-09-26 MED ORDER — HYDROCOD POLI-CHLORPHE POLI ER 10-8 MG/5ML PO SUER: 5.0000 mL | Freq: Two times a day (BID) | ORAL | 0 refills | Status: DC | PRN

## 2022-09-26 MED ORDER — INFLUENZA VAC SPLIT QUAD 0.5 ML IM SUSY
0.5000 mL | PREFILLED_SYRINGE | Freq: Once | INTRAMUSCULAR | Status: AC
  Administered 2022-09-26: 0.5 mL via INTRAMUSCULAR
  Filled 2022-09-26: qty 0.5

## 2022-09-26 NOTE — Progress Notes (Signed)
#        [place] [phone]   Fax:(336) [place], [place], MD [place] [address]  PRINCIPAL DIAGNOSIS: Local recurrence of non-small cell lung cancer, adenocarcinoma, initially diagnosed as stage IB (T2a N0 M0) in March of 2010.   PRIOR THERAPY:  Status post right lower lobectomy under the care of Dr. Cyndia Bent on 04/08/2009. Status post palliative radiotherapy to the right lower lobe recurrent lung mass under the care of Dr. Lisbeth Renshaw, completed on June 08, 2011. Systemic chemotherapy with carboplatin for AUC of 5 and Alimta 500 mg/M2 every 3 weeks. She is status post 3 cycles.  CURRENT THERAPY: Tarceva 150 mg by mouth daily, therapy beginning 07/24/2012. Status post approximately 113 months of therapy.   INTERVAL HISTORY: [name] 61 y.o. female returns to the clinic today for 32-month follow-up visit.  The patient is feeling fine today with no concerning complaints except for the dry cough and she is requesting refill of Tussionex.  She denied having any current chest pain, shortness of breath or hemoptysis.  She has no nausea, vomiting, diarrhea or constipation.  She denied having any headache or visual changes.  She has no recent weight loss or night sweats.  She continues to tolerate her treatment with Tarceva fairly well.  The patient has planned to travel to [place] in around 3 weeks.  She will stay there for another 3 weeks before coming back to the [place].  She had repeat CT scan of the chest performed recently and she is here for evaluation and discussion of her scan results.   MEDICAL HISTORY: Past Medical History:  Diagnosis Date   Drug-induced skin rash 02/27/2017   Encounter for antineoplastic chemotherapy 01/19/2016   History of radiation therapy 05/02/11 to 06/08/11   right lung   Lung cancer ([facility])    right lower lobe adenocarcinoma    ALLERGIES:  is allergic to codeine and  doxycycline.  MEDICATIONS:  Current Outpatient Medications  Medication Sig Dispense Refill   acetaminophen (TYLENOL) 500 MG tablet Take 500 mg every 6 (six) hours as needed by mouth.     albuterol (PROAIR HFA) 108 (90 Base) MCG/ACT inhaler Inhale 1-2 puffs into the lungs every 6 (six) hours as needed for wheezing or shortness of breath. 1 Inhaler 2   aspirin EC 81 MG tablet Take 81 mg by mouth daily. Swallow whole. (Patient not taking: Reported on 02/21/2022)     chlorpheniramine-HYDROcodone (TUSSIONEX) 10-8 MG/5ML Take 5 mLs by mouth every 12 (twelve) hours as needed for cough. 240 mL 0   diltiazem (CARDIZEM) 60 MG tablet Take one tablet by mouth at 4pm as needed 30 tablet 1   diltiazem (TIADYLT ER) 120 MG 24 hr capsule Take 1 capsule by mouth daily. 90 capsule 0   erlotinib (TARCEVA) 150 MG tablet Take 1 tablet (150 mg total) by mouth daily. Take on an empty stomach at least 1 hour before or 2 hours after food. 30 tablet 11   lidocaine-prilocaine (EMLA) cream Apply 1 application topically as needed. 30 g 0   No current facility-administered medications for this visit.    SURGICAL HISTORY:  Past Surgical History:  Procedure Laterality Date   LUNG LOBECTOMY  04/18/2009   RLL   PORTACATH PLACEMENT  02/29/2012   Procedure: INSERTION PORT-A-CATH;  Surgeon: Nicanor Alcon, MD;  Location: [facility];  Service: Thoracic;  Laterality: Left;  PowerPort 8 F attachable in  left internal jugular.    REVIEW OF SYSTEMS:  Constitutional: negative Eyes: negative Ears, nose, mouth, throat, and face: negative Respiratory: positive for cough Cardiovascular: negative Gastrointestinal: negative Genitourinary:negative Integument/breast: positive for rash Hematologic/lymphatic: negative Musculoskeletal:negative Neurological: negative Behavioral/Psych: negative Endocrine: negative Allergic/Immunologic: negative   PHYSICAL EXAMINATION: General appearance: alert, cooperative, and no distress Head:  Normocephalic, without obvious abnormality, atraumatic Neck: no adenopathy, no JVD, supple, symmetrical, trachea midline, and thyroid not enlarged, symmetric, no tenderness/mass/nodules Lymph nodes: Cervical, supraclavicular, and axillary nodes normal. Resp: clear to auscultation bilaterally Back: symmetric, no curvature. ROM normal. No CVA tenderness. Cardio: regular rate and rhythm, S1, S2 normal, no murmur, click, rub or gallop GI: soft, non-tender; bowel sounds normal; no masses,  no organomegaly Extremities: extremities normal, atraumatic, no cyanosis or edema Neurologic: Alert and oriented X 3, normal strength and tone. Normal symmetric reflexes. Normal coordination and gait  ECOG PERFORMANCE STATUS: 1 - Symptomatic but completely ambulatory  Blood pressure (!) 156/73, pulse 74, temperature 98.3 F (36.8 C), temperature source Oral, resp. rate 18, weight 124 lb 4 oz (56.4 kg), last menstrual period 02/10/2012, SpO2 99 %.  LABORATORY DATA: Lab Results  Component Value Date   WBC 7.8 09/19/2022   HGB 13.0 09/19/2022   HCT 39.3 09/19/2022   MCV 86.9 09/19/2022   PLT 235 09/19/2022      Chemistry      Component Value Date/Time   NA 139 09/19/2022 1027   NA 143 10/30/2017 1031   K 4.0 09/19/2022 1027   K 4.3 10/30/2017 1031   CL 104 09/19/2022 1027   CL 102 04/30/2013 1002   CO2 28 09/19/2022 1027   CO2 29 10/30/2017 1031   BUN 13 09/19/2022 1027   BUN 14.6 10/30/2017 1031   CREATININE 0.56 09/19/2022 1027   CREATININE 0.7 10/30/2017 1031      Component Value Date/Time   CALCIUM 8.7 (L) 09/19/2022 1027   CALCIUM 9.2 10/30/2017 1031   ALKPHOS 103 09/19/2022 1027   ALKPHOS 103 10/30/2017 1031   AST 22 09/19/2022 1027   AST 20 10/30/2017 1031   ALT 15 09/19/2022 1027   ALT 14 10/30/2017 1031   BILITOT 0.3 09/19/2022 1027   BILITOT 0.79 10/30/2017 1031       RADIOGRAPHIC STUDIES: CT Chest W Contrast  Result Date: 09/19/2022 CLINICAL DATA:  Non-small cell lung  cancer staging. * Tracking Code: BO * EXAM: CT CHEST WITH CONTRAST TECHNIQUE: Multidetector CT imaging of the chest was performed during intravenous contrast administration. RADIATION DOSE REDUCTION: This exam was performed according to the departmental dose-optimization program which includes automated exposure control, adjustment of the mA and/or kV according to patient size and/or use of iterative reconstruction technique. CONTRAST:  68mL OMNIPAQUE IOHEXOL 300 MG/ML  SOLN COMPARISON:  March 14, 2022 FINDINGS: Cardiovascular: Heart size is normal. Mild pericardial effusion is similar to previous imaging. No pericardial nodularity. LEFT-sided Port-A-Cath terminates in the region of the upper RIGHT atrium. Calcified and noncalcified aortic atherosclerotic plaque. Central pulmonary vasculature is unremarkable on venous phase. Distortion of RIGHT hilum is similar to previous imaging. Mediastinum/Nodes: No discrete adenopathy in the chest. Esophagus grossly normal. Lungs/Pleura: Volume loss and postoperative changes in the medial RIGHT chest with stable appearance. Ovoid area amidst volume loss in the RIGHT medial chest (image 69/2) 3.2 x 1.9 cm previously 3.2 x 1.9 cm. Pleural thickening throughout the inferior RIGHT chest is stable. 18 x 14 mm area extending from the RIGHT hilum into aerated lung is similar size compared  to October 2022 and is increased in size compared to April of 2023 where it measured approximately 13 by 12 mm. Upper Abdomen: Incidental imaging of upper abdominal contents is unremarkable to the extent evaluated. Musculoskeletal: Post treatment changes in posterior RIGHT-sided ribs similar to previous imaging. Spinal degenerative changes. IMPRESSION: 1. Post treatment changes in the medial RIGHT chest with stable appearance of the rounded area amidst volume loss in the RIGHT medial chest. 2. Waxing and waning area extending from the RIGHT hilum into aerated portions of RIGHT lung in the mid chest.  Findings could represent pneumonitis or inflammation along an area of post treatment change. Would suggest short interval follow-up to ensure no continued enlargement. 3. Post RIGHT lower lobectomy. 4. Stable small pericardial effusion. 5. Aortic atherosclerosis. Aortic Atherosclerosis (ICD10-I70.0). Electronically Signed   By: Zetta Bills M.D.   On: 09/19/2022 21:11     ASSESSMENT AND PLAN:  This is a very pleasant 61 years old Asian female with recurrent non-small cell lung cancer, adenocarcinoma status post surgical resection followed by systemic chemotherapy with carboplatin and Alimta followed by disease progression and the patient was started on treatment with Tarceva 150 mg by mouth daily status post 113 months  The patient has been tolerating this treatment well with no concerning adverse effects except for the mild skin rash and occasional diarrhea. She had repeat CT scan of the chest performed recently.  I personally and independently reviewed the scan and discussed the result with the patient today. Her scan showed no concerning findings for disease progression. I recommended for her to continue her current treatment with Tarceva with the same dose. I will see her back for follow-up visit in 3 months for evaluation and repeat blood work. For the dry cough I will give her a refill of Tussionex. For the skin rash I also gave her a refill of clindamycin lotion. The patient was advised to call immediately if she has any other concerning symptoms in the interval.  All questions were answered. The patient knows to call the clinic with any problems, questions or concerns. We can certainly see the patient much sooner if necessary. The total time spent in the appointment was 30 minutes.  Disclaimer: This note was dictated with voice recognition software. Similar sounding words can inadvertently be transcribed and may not be corrected upon review.

## 2022-09-26 NOTE — Patient Instructions (Signed)
Preventing Influenza, Adult Influenza, also known as the flu, is an infection caused by a virus. The flu mainly affects the nose, throat, and lungs (respiratory system). This infection causes common cold symptoms, as well as a high fever and body aches. The flu spreads easily from person to person (is contagious). The flu is most common from December through March. This period of time is called flu season. You can catch the flu virus by: Breathing in droplets from an infected person's cough or sneeze. Touching something that recently got the virus on it (is contaminated) and then touching your mouth, nose, or eyes. How can this condition affect me? If you get the flu, your friends, family, and co-workers are also at risk of getting it because the flu spreads easily to others. Having the flu can lead to complications, such as pneumonia, ear infection, and sinus infection. The flu also can be life-threatening, especially for babies, people older than age 52, and people who have serious long-term diseases. You can protect yourself and other people by: Keeping your body's defense system (immune system) strong. Getting a flu shot, or influenza vaccination, each year. Practicing good health habits. What can increase my risk? The following factors may make you more likely to get the flu: Not washing or sanitizing your hands often. Having close contact with many people during cold and flu season. Touching your mouth, eyes, or nose without first washing or sanitizing your hands. Not getting a flu shot each year. Working in health care. You may be at risk for more serious flu symptoms if: You are older than age 22. You are pregnant. Your immune system is weak. You have a condition that makes the flu worse or life-threatening. You have severe obesity. What actions can I take to prevent this? Lifestyle You can lower your risk of getting the flu by keeping your immune system in good shape. Do this  by: Eating a healthy diet. Drinking plenty of fluids. Drink enough fluid to keep your urine pale yellow. Getting enough sleep. Exercising regularly. Do not use any products that contain nicotine or tobacco. These products include cigarettes, chewing tobacco, and vaping devices, such as e-cigarettes. If you need help quitting, ask your health care provider. Medicines Getting a flu shot every year can lower your risk of getting the flu. This is the best way to prevent the flu. A flu shot is recommended for everyone age 62 months or older. It is best to get a flu shot in the fall, as soon as it is available. But getting a flu shot during winter or spring instead is still a good idea. Flu season can last into early spring. Preventing the flu through vaccination means getting a new flu shot every year. This is because the flu virus changes slightly (mutates) from one year to the next. The flu shot does not completely protect you from all flu virus mutations. If you get the flu after you get the shot, the shot can make your illness milder and go away sooner. It also can prevent dangerous complications of the flu. If you are pregnant, you can and should get a flu shot. Check with your health care provider before getting a flu shot if you have had a reaction to the shot in the past or if you are allergic to eggs. Sometimes the vaccine is available as a nasal spray. In some years, the nasal spray has not been as effective against the flu virus. Check with your health care  provider if you have questions about this. Most people recover from the flu by resting at home and drinking plenty of fluids. However, a prescription antiviral medicine may reduce your flu symptoms and may make your flu go away sooner. This medicine must be started within a few days of getting flu symptoms. Antiviral medicine may be prescribed for people who are at risk for more serious flu symptoms. Talk with your health care provider about  whether you need an antiviral medicine.  General information     You can also lower your risk of getting the flu by practicing good health habits. This is especially important during flu season. Avoid contact with people who are sick with flu or cold symptoms. Wash your hands with soap and water often and for at least 20 seconds. If soap and water are not available, use alcohol-based hand sanitizer. Avoid touching your hands to your face, especially when you have not washed your hands recently. Use a cleanser that kills germs (a disinfectant) to clean surfaces at home and at work that may have the flu virus on them. If you do get the flu, avoid spreading it to others by: Staying home until your symptoms, including your fever, have been gone for at least 24 hours. Covering your mouth and nose when you cough or sneeze. Avoiding close contact with others, especially babies and older people.  Where to find more information Centers for Disease Control and Prevention: http://www.wolf.info/ American Academy of Family Physicians: www.familydoctor.org Contact a health care provider if: You have the flu and you develop new symptoms. You have diarrhea. You have a fever. Your cough gets worse, or you produce more mucus. Get help right away if you: Have trouble breathing. Have chest pain. These symptoms may represent a serious problem that is an emergency. Do not wait to see if the symptoms will go away. Get medical help right away. Call your local emergency services (911 in the [place] Do not drive yourself to the hospital. Summary The best way to prevent the flu is to get a flu shot (influenza vaccination) every year in the fall. Even if you get the flu after you have received the yearly flu shot, your flu may be milder and go away sooner because of your flu shot. If you get the flu, antiviral medicines that are started within a few days of flu symptoms may reduce your symptoms and reduce how long the flu  lasts. You can also help prevent the flu by practicing good health habits. This information is not intended to replace advice given to you by your health care provider. Make sure you discuss any questions you have with your health care provider. Document Revised: 07/03/2020 Document Reviewed: 07/03/2020 Elsevier Patient Education  [place].

## 2022-10-04 ENCOUNTER — Telehealth: Payer: Self-pay | Admitting: Internal Medicine

## 2022-10-04 MED ORDER — DILTIAZEM HCL 60 MG PO TABS: ORAL_TABLET | ORAL | 1 refills | Status: DC

## 2022-10-04 NOTE — Telephone Encounter (Signed)
This has been addressed in another encounter dated 10/04/2022.  Please see that encounter for complete information.

## 2022-10-04 NOTE — Telephone Encounter (Signed)
Spoke with pt who gives verbal permission to speak with [facility], caller.  She reports pt complains of SOB on exertion x 2 weeks.  She denies current CP, edema or dizziness.  She does not have a way to check her BP or HR at home.  She reports some increase in her tachycardia but is taking Cardizem 60mg  as needed in addition to her Diltiazem ER 120mg  daily.  She is planning to go out of town for several weeks and requests to be seen by someone to further evaluate SOB.  Currently there is no EP availability.  Recent chest CT ordered by oncology does reference a small pericardial effusion. Appointment scheduled with NP for 10/10/2022.  Encouraged pt to also discuss her new SOB with oncology and her PCP as this may not be cardiac in nature.  Pt and Molisa verbalize understanding and agrees with current plan.

## 2022-10-04 NOTE — Telephone Encounter (Signed)
Pt daughter called asking to set up an appt with Renee, PA because she is not feeling well. Informed her to give [place] a call back about what specifically is going on with pt. Pt made an appt for 11/09/22.

## 2022-10-04 NOTE — Telephone Encounter (Signed)
Pt c/o Shortness Of Breath: STAT if SOB developed within the last 24 hours or pt is noticeably SOB on the phone  1. Are you currently SOB (can you hear that pt is SOB on the phone)? No  2. How long have you been experiencing SOB?  1 week  3. Are you SOB when sitting or when up moving around? Both  4. Are you currently experiencing any other symptoms? Weak not feeling well.   Daughter would like a callback regarding this matter being that pt is due to leave the country in a few weeks. Please advise

## 2022-10-09 NOTE — Progress Notes (Addendum)
Office Visit    Patient Name: [name] Date of Encounter: 10/09/2022  Primary Care Provider:  Horald Pollen, MD Primary Cardiologist:  None Primary Electrophysiologist: Virl Axe, MD  Chief Complaint    [name] is a 61 y.o. female with PMH of non-small cell lung cancer of right hilar area s/p RLL lobectomy with surgery and radiation therapy 03/2009 now maintained on Tarceva, essential hypertension and SVT who presents today for shortness of breath and tachycardia. Past Medical History    Past Medical History:  Diagnosis Date   Drug-induced skin rash 02/27/2017   Encounter for antineoplastic chemotherapy 01/19/2016   History of radiation therapy 05/02/11 to 06/08/11   right lung   Lung cancer [facility])    right lower lobe adenocarcinoma   Past Surgical History:  Procedure Laterality Date   LUNG LOBECTOMY  04/18/2009   RLL   PORTACATH PLACEMENT  02/29/2012   Procedure: INSERTION PORT-A-CATH;  Surgeon: Nicanor Alcon, MD;  Location: [place];  Service: Thoracic;  Laterality: Left;  PowerPort 8 F attachable in left internal jugular.    Allergies  Allergies  Allergen Reactions   Codeine Nausea Only   Doxycycline Nausea And Vomiting    vomiting and headache    History of Present Illness    [name]  is a 61 year old female with the above mention past medical history who presents today for shortness of breath and atrial tachycardia.  She was initially seen by Dr. Caryl Comes for consultation in 2018 at [facility] for pauses seen on telemetry.  She was being seen at that time for chronic dry cough.  Review of her EKG revealed SVT/atrial tach.  EKG also showed multiple P wave morphologies resembling multifocal atrial tachycardia.  2D echo was completed with normal LV function and EF of 60-65%, mild LVH with no RWMA.  She was started on Cardizem and seen in follow-up and was tolerating her medication with no adverse reactions.  She was last seen by Dr. Caryl Comes on 11/2021 for follow-up.  During her  visit she endorsed episodes primarily occurring at night of tachycardia associated with bilateral hand tingling dizziness and blurred vision.  She reported compliance with her Cardizem.  She also noted atypical chest pain that seemed to be noncardiac in nature.   Since last being seen in the office patient reports***.  Patient denies chest pain, palpitations, dyspnea, PND, orthopnea, nausea, vomiting, dizziness, syncope, edema, weight gain, or early satiety.     ***Notes: -New shortness of breath and history of atrial tachycardia Home Medications    Current Outpatient Medications  Medication Sig Dispense Refill   acetaminophen (TYLENOL) 500 MG tablet Take 500 mg every 6 (six) hours as needed by mouth.     albuterol (PROAIR HFA) 108 (90 Base) MCG/ACT inhaler Inhale 1-2 puffs into the lungs every 6 (six) hours as needed for wheezing or shortness of breath. 1 Inhaler 2   aspirin EC 81 MG tablet Take 81 mg by mouth daily. Swallow whole. (Patient not taking: Reported on 02/21/2022)     chlorpheniramine-HYDROcodone (TUSSIONEX) 10-8 MG/5ML Take 5 mLs by mouth every 12 (twelve) hours as needed for cough. 240 mL 0   clindamycin (CLEOCIN T) 1 % lotion Apply topically 2 (two) times daily. 60 mL 0   diltiazem (CARDIZEM) 60 MG tablet Take one tablet by mouth at 4pm as needed 30 tablet 1   diltiazem (TIADYLT ER) 120 MG 24 hr capsule Take 1 capsule by mouth daily. 90 capsule  0   erlotinib (TARCEVA) 150 MG tablet Take 1 tablet (150 mg total) by mouth daily. Take on an empty stomach at least 1 hour before or 2 hours after food. 30 tablet 11   lidocaine-prilocaine (EMLA) cream Apply 1 application topically as needed. 30 g 0   No current facility-administered medications for this visit.     Review of Systems  Please see the history of present illness.    (+)*** (+)***  All other systems reviewed and are otherwise negative except as noted above.  Physical Exam    Wt Readings from Last 3 Encounters:   09/26/22 124 lb 4 oz (56.4 kg)  06/20/22 121 lb 12.8 oz (55.2 kg)  05/07/22 120 lb (54.4 kg)   ZO:XWRUE were no vitals filed for this visit.,There is no height or weight on file to calculate BMI.  Constitutional:      Appearance: Healthy appearance. Not in distress.  Neck:     Vascular: JVD normal.  Pulmonary:     Effort: Pulmonary effort is normal.     Breath sounds: No wheezing. No rales. Diminished in the bases Cardiovascular:     Normal rate. Regular rhythm. Normal S1. Normal S2.      Murmurs: There is no murmur.  Edema:    Peripheral edema absent.  Abdominal:     Palpations: Abdomen is soft non tender. There is no hepatomegaly.  Skin:    General: Skin is warm and dry.  Neurological:     General: No focal deficit present.     Mental Status: Alert and oriented to person, place and time.     Cranial Nerves: Cranial nerves are intact.  EKG/LABS/Other Studies Reviewed    ECG personally reviewed by me today - ***  Risk Assessment/Calculations:   {Does this patient have ATRIAL FIBRILLATION?[phone]}        Lab Results  Component Value Date   WBC 7.8 09/19/2022   HGB 13.0 09/19/2022   HCT 39.3 09/19/2022   MCV 86.9 09/19/2022   PLT 235 09/19/2022   Lab Results  Component Value Date   CREATININE 0.56 09/19/2022   BUN 13 09/19/2022   NA 139 09/19/2022   K 4.0 09/19/2022   CL 104 09/19/2022   CO2 28 09/19/2022   Lab Results  Component Value Date   ALT 15 09/19/2022   AST 22 09/19/2022   ALKPHOS 103 09/19/2022   BILITOT 0.3 09/19/2022   Lab Results  Component Value Date   CHOL 185 02/21/2022   HDL 62.70 02/21/2022   LDLCALC 88 02/21/2022   TRIG 169.0 (H) 02/21/2022   CHOLHDL 3 02/21/2022    Lab Results  Component Value Date   HGBA1C 6.2 02/21/2022    Assessment & Plan    1.  History of SVT with ectopic atrial tachycardia: -Today patient reports*** -She is currently on Cardizem 60 mg as needed and Cardizem ER 120 mg daily  2.  Shortness of  breath: -Today patient reports***  3.  History of non-small cell lung cancer: -s/p RLL lobectomy with radiation in 2010 currently treated with Tarceva  4.  Essential hypertension: -Blood pressures today were*** -Continue CCB as noted above      Disposition: Follow-up with None or APP in *** months {Are you ordering a CV Procedure (e.g. stress test, cath, DCCV, TEE, etc)?   Press F2        :[id number]}   Medication Adjustments/Labs and Tests Ordered: Current medicines are reviewed at length with the patient today.  Concerns regarding medicines are outlined above.   Signed, Mable Fill, Marissa Nestle, NP 10/09/2022, 11:35 AM [facility]  Note:  This document was prepared using Dragon voice recognition software and may include unintentional dictation errors.

## 2022-10-10 ENCOUNTER — Ambulatory Visit (INDEPENDENT_AMBULATORY_CARE_PROVIDER_SITE_OTHER): Payer: Medicare Other

## 2022-10-10 ENCOUNTER — Telehealth: Payer: Self-pay

## 2022-10-10 ENCOUNTER — Other Ambulatory Visit: Payer: Medicare Other

## 2022-10-10 ENCOUNTER — Ambulatory Visit: Payer: Medicare Other | Attending: Nurse Practitioner | Admitting: Nurse Practitioner

## 2022-10-10 ENCOUNTER — Other Ambulatory Visit: Payer: Self-pay

## 2022-10-10 ENCOUNTER — Other Ambulatory Visit: Payer: Self-pay | Admitting: Nurse Practitioner

## 2022-10-10 ENCOUNTER — Encounter: Payer: Self-pay | Admitting: Nurse Practitioner

## 2022-10-10 VITALS — BP 140/80 | HR 71 | Ht <= 58 in | Wt 123.4 lb

## 2022-10-10 DIAGNOSIS — I498 Other specified cardiac arrhythmias: Secondary | ICD-10-CM

## 2022-10-10 DIAGNOSIS — R000 Tachycardia, unspecified: Secondary | ICD-10-CM

## 2022-10-10 DIAGNOSIS — R0602 Shortness of breath: Secondary | ICD-10-CM

## 2022-10-10 DIAGNOSIS — I10 Essential (primary) hypertension: Secondary | ICD-10-CM | POA: Insufficient documentation

## 2022-10-10 DIAGNOSIS — C3431 Malignant neoplasm of lower lobe, right bronchus or lung: Secondary | ICD-10-CM | POA: Insufficient documentation

## 2022-10-10 LAB — TSH: TSH: 1.26 u[IU]/mL (ref 0.450–4.500)

## 2022-10-10 LAB — MAGNESIUM: Magnesium: 2.7 mg/dL — ABNORMAL HIGH (ref 1.6–2.3)

## 2022-10-10 MED ORDER — HYDROCOD POLI-CHLORPHE POLI ER 10-8 MG/5ML PO SUER: 5.0000 mL | Freq: Two times a day (BID) | ORAL | 0 refills | Status: DC | PRN

## 2022-10-10 NOTE — Progress Notes (Unsigned)
ZIO AT serial # [id number] from office inventory applied to patient.  Dr. Caryl Comes to read.

## 2022-10-10 NOTE — Patient Instructions (Addendum)
Medication Instructions:  Your physician recommends that you continue on your current medications as directed. Please refer to the Current Medication list given to you today. *If you need a refill on your cardiac medications before your next appointment, please call your pharmacy*   Lab Work: TODAY-MAG & TSH If you have labs (blood work) drawn today and your tests are completely normal, you will receive your results only by: [place] (if you have MyChart) OR A paper copy in the mail If you have any lab test that is abnormal or we need to change your treatment, we will call you to review the results.   Testing/Procedures: [place]- Long Term Monitor Instructions  Your physician has requested you wear a ZIO patch monitor for 14 days.  This is a single patch monitor. [employer] supplies one patch monitor per enrollment. Additional stickers are not available. Please do not apply patch if you will be having a Nuclear Stress Test,  Echocardiogram, Cardiac CT, MRI, or Chest Xray during the period you would be wearing the  monitor. The patch cannot be worn during these tests. You cannot remove and re-apply the  ZIO XT patch monitor.  Your ZIO patch monitor will be mailed 3 day USPS to your address on file. It may take 3-5 days  to receive your monitor after you have been enrolled.  Once you have received your monitor, please review the enclosed instructions. Your monitor  has already been registered assigning a specific monitor serial # to you.  Billing and Patient Assistance Program Information  We have supplied [employer] with any of your insurance information on file for billing purposes. [employer] offers a sliding scale Patient Assistance Program for patients that do not have  insurance, or whose insurance does not completely cover the cost of the ZIO monitor.  You must apply for the Patient Assistance Program to qualify for this discounted rate.  To apply, please call [employer] at  [phone], select option 4, select option 2, ask to apply for  Patient Assistance Program. Theodore Demark will ask your household income, and how many people  are in your household. They will quote your out-of-pocket cost based on that information.  [employer] will also be able to set up a 56-month, interest-free payment plan if needed.  Applying the monitor   Shave hair from upper left chest.  Hold abrader disc by orange tab. Rub abrader in 40 strokes over the upper left chest as  indicated in your monitor instructions.  Clean area with 4 enclosed alcohol pads. Let dry.  Apply patch as indicated in monitor instructions. Patch will be placed under collarbone on left  side of chest with arrow pointing upward.  Rub patch adhesive wings for 2 minutes. Remove white label marked "1". Remove the white  label marked "2". Rub patch adhesive wings for 2 additional minutes.  While looking in a mirror, press and release button in center of patch. A small green light will  flash 3-4 times. This will be your only indicator that the monitor has been turned on.  Do not shower for the first 24 hours. You may shower after the first 24 hours.  Press the button if you feel a symptom. You will hear a small click. Record Date, Time and  Symptom in the Patient Logbook.  When you are ready to remove the patch, follow instructions on the last 2 pages of Patient  Logbook. Stick patch monitor onto the last page of Patient Logbook.  Place Patient Logbook  in the blue and white box. Use locking tab on box and tape box closed  securely. The blue and white box has prepaid postage on it. Please place it in the mailbox as  soon as possible. Your physician should have your test results approximately 7 days after the  monitor has been mailed back to [facility].  Call [place] at [phone] if you have questions regarding  your ZIO XT patch monitor. Call them immediately if you see an orange light  blinking on your  monitor.  If your monitor falls off in less than 4 days, contact our Monitor department at [phone].  If your monitor becomes loose or falls off after 4 days call [employer] at [phone] for  suggestions on securing your monitor   Follow-Up: At [facility], you and your health needs are our priority.  As part of our continuing mission to provide you with exceptional heart care, we have created designated Provider Care Teams.  These Care Teams include your primary Cardiologist (physician) and Advanced Practice Providers (APPs -  Physician Assistants and Nurse Practitioners) who all work together to provide you with the care you need, when you need it.  We recommend signing up for the patient portal called "MyChart".  Sign up information is provided on this After Visit Summary.  MyChart is used to connect with patients for Virtual Visits (Telemedicine).  Patients are able to view lab/test results, encounter notes, upcoming appointments, etc.  Non-urgent messages can be sent to your provider as well.   To learn more about what you can do with MyChart, go to [email or web address].    Your next appointment:   1 week(s)  The format for your next appointment:   In Person  Provider:   Ambrose Pancoast, NP      Other Instructions PLEASE [place]; BRING YOUR READINGS TO YOUR NEXT APPOINTMENT.   Important Information About Sugar

## 2022-10-10 NOTE — Progress Notes (Unsigned)
Enrolled for [employer] to mail a ZIO XT long term holter monitor to the patients address on file.   Dr. Caryl Comes to read.

## 2022-10-10 NOTE — Telephone Encounter (Signed)
This nurse received a message from this patients son.  Requesting a refill on patients Tussionex.  He states [place] will be going [place] next [place] and he wants to make sure that she has enough medicine for her cough.  Refill request forwarded to provider.  No other questions or concerns noted at this time.

## 2022-10-15 NOTE — Progress Notes (Unsigned)
Office Visit    Patient Name: [name] Date of Encounter: 10/15/2022  Primary Care Provider:  Horald Pollen, MD Primary Cardiologist:  None Primary Electrophysiologist: Virl Axe, MD  Chief Complaint    [name] is a 61 y.o. female with PMH of non-small cell lung cancer of right hilar area s/p RLL lobectomy with surgery and radiation therapy 03/2009 now maintained on Tarceva, essential hypertension and SVT who presents today for 1 week follow-up of shortness of breath and tachycardia.   Past Medical History    Past Medical History:  Diagnosis Date   Drug-induced skin rash 02/27/2017   Encounter for antineoplastic chemotherapy 01/19/2016   History of radiation therapy 05/02/11 to 06/08/11   right lung   Lung cancer [facility])    right lower lobe adenocarcinoma   Past Surgical History:  Procedure Laterality Date   LUNG LOBECTOMY  04/18/2009   RLL   PORTACATH PLACEMENT  02/29/2012   Procedure: INSERTION PORT-A-CATH;  Surgeon: Nicanor Alcon, MD;  Location: [place];  Service: Thoracic;  Laterality: Left;  PowerPort 8 F attachable in left internal jugular.    Allergies  Allergies  Allergen Reactions   Codeine Nausea Only   Doxycycline Nausea And Vomiting    vomiting and headache    History of Present Illness    [name]  is a 61 year old female with the above mention past medical history who presents today for shortness of breath and atrial tachycardia.  She was initially seen by Dr. Caryl Comes for consultation in 2018 at [facility] for pauses seen on telemetry.  She was being seen at that time for chronic dry cough.  Review of her EKG revealed SVT/atrial tach.  EKG also showed multiple P wave morphologies resembling multifocal atrial tachycardia.  2D echo was completed with normal LV function and EF of 60-65%, mild LVH with no RWMA.  She was started on Cardizem and seen in follow-up and was tolerating her medication with no adverse reactions.  She was last seen by Dr. Caryl Comes on 11/2021 for  follow-up.  During her visit she endorsed episodes primarily occurring at night of tachycardia associated with bilateral hand tingling dizziness and blurred vision.  She reported compliance with her Cardizem.  She also noted atypical chest pain that seemed to be noncardiac in nature.  Recent CT scan of the chest completed and did note mild pericardial effusion that was seen previous imaging but no acute findings.  I saw [name] in clinic on 11/13 with complaint of shortness of breath and palpitations.  She endorses tingling in her hands and feet and blood pressure was elevated at 140/80.  She endorsed compliance with her Cardizem and noted that she had taken her as needed dose of 60 mg 3 times over the past week.  3-day ZIO monitor was ordered due to compensatory pause seen on EKG.  She was instructed to also monitor blood pressures with plan to increase BP med at next visit.  Since last being seen in the office patient reports***.  Patient denies chest pain, palpitations, dyspnea, PND, orthopnea, nausea, vomiting, dizziness, syncope, edema, weight gain, or early satiety.   ***Notes:  Home Medications    Current Outpatient Medications  Medication Sig Dispense Refill   acetaminophen (TYLENOL) 500 MG tablet Take 500 mg every 6 (six) hours as needed by mouth.     albuterol (PROAIR HFA) 108 (90 Base) MCG/ACT inhaler Inhale 1-2 puffs into the lungs every 6 (six) hours as needed for  wheezing or shortness of breath. 1 Inhaler 2   chlorpheniramine-HYDROcodone (TUSSIONEX) 10-8 MG/5ML Take 5 mLs by mouth every 12 (twelve) hours as needed for cough. 240 mL 0   clindamycin (CLEOCIN T) 1 % lotion Apply topically 2 (two) times daily. 60 mL 0   diltiazem (CARDIZEM) 60 MG tablet Take one tablet by mouth at 4pm as needed 30 tablet 1   diltiazem (TIADYLT ER) 120 MG 24 hr capsule Take 1 capsule by mouth daily. 90 capsule 0   erlotinib (TARCEVA) 150 MG tablet Take 1 tablet (150 mg total) by mouth daily. Take on an empty  stomach at least 1 hour before or 2 hours after food. 30 tablet 11   lidocaine-prilocaine (EMLA) cream Apply 1 application topically as needed. 30 g 0   No current facility-administered medications for this visit.     Review of Systems  Please see the history of present illness.    (+)*** (+)***  All other systems reviewed and are otherwise negative except as noted above.  Physical Exam    Wt Readings from Last 3 Encounters:  10/10/22 123 lb 6.4 oz (56 kg)  09/26/22 124 lb 4 oz (56.4 kg)  06/20/22 121 lb 12.8 oz (55.2 kg)   TZ:GYFVC were no vitals filed for this visit.,There is no height or weight on file to calculate BMI.  Constitutional:      Appearance: Healthy appearance. Not in distress.  Neck:     Vascular: JVD normal.  Pulmonary:     Effort: Pulmonary effort is normal.     Breath sounds: No wheezing. No rales. Diminished in the bases Cardiovascular:     Normal rate. Regular rhythm. Normal S1. Normal S2.      Murmurs: There is no murmur.  Edema:    Peripheral edema absent.  Abdominal:     Palpations: Abdomen is soft non tender. There is no hepatomegaly.  Skin:    General: Skin is warm and dry.  Neurological:     General: No focal deficit present.     Mental Status: Alert and oriented to person, place and time.     Cranial Nerves: Cranial nerves are intact.  EKG/LABS/Other Studies Reviewed    ECG personally reviewed by me today - ***  Risk Assessment/Calculations:   {Does this patient have ATRIAL FIBRILLATION?[phone]}        Lab Results  Component Value Date   WBC 7.8 09/19/2022   HGB 13.0 09/19/2022   HCT 39.3 09/19/2022   MCV 86.9 09/19/2022   PLT 235 09/19/2022   Lab Results  Component Value Date   CREATININE 0.56 09/19/2022   BUN 13 09/19/2022   NA 139 09/19/2022   K 4.0 09/19/2022   CL 104 09/19/2022   CO2 28 09/19/2022   Lab Results  Component Value Date   ALT 15 09/19/2022   AST 22 09/19/2022   ALKPHOS 103 09/19/2022   BILITOT  0.3 09/19/2022   Lab Results  Component Value Date   CHOL 185 02/21/2022   HDL 62.70 02/21/2022   LDLCALC 88 02/21/2022   TRIG 169.0 (H) 02/21/2022   CHOLHDL 3 02/21/2022    Lab Results  Component Value Date   HGBA1C 6.2 02/21/2022    Assessment & Plan    1.  History of SVT with ectopic atrial tachycardia: -Today patient reports tingling in her hands and feet and palpitations with increased ambulation that have occurred over the past 3 weeks. -She is currently on Cardizem 60 mg as needed  and Cardizem ER 120 mg daily -She reports that she has used her Cardizem 60 mg at least 3 times per week over the past 3 weeks. -We will order ZIO monitor 3-day to evaluate for prolonged pauses and increased atrial arrhythmia. -TSH and magnesium today   2.  Shortness of breath: -Today patient reports no shortness of breath today.   3.  History of non-small cell lung cancer: -s/p RLL lobectomy with radiation in 2010 currently treated with Tarceva   4.  Essential hypertension: -Blood pressures today were elevated at 140/80 and was slightly elevated on recheck.  She endorsed having some dental pain that occurred over the past 2 days and may be contributing factor. -She was instructed to monitor blood pressures over the next week and bring in readings at follow-up in 1 week. -Continue CCB as noted above      Disposition: Follow-up with None or APP in *** months {Are you ordering a CV Procedure (e.g. stress test, cath, DCCV, TEE, etc)?   Press F2        :[id number]}   Medication Adjustments/Labs and Tests Ordered: Current medicines are reviewed at length with the patient today.  Concerns regarding medicines are outlined above.   Signed, Mable Fill, Marissa Nestle, NP 10/15/2022, 2:09 PM [facility]  Note:  This document was prepared using Dragon voice recognition software and may include unintentional dictation errors.

## 2022-10-17 ENCOUNTER — Encounter: Payer: Self-pay | Admitting: Nurse Practitioner

## 2022-10-17 ENCOUNTER — Ambulatory Visit: Payer: Medicare Other | Attending: Nurse Practitioner | Admitting: Nurse Practitioner

## 2022-10-17 VITALS — BP 112/60 | HR 90 | Ht <= 58 in | Wt 121.0 lb

## 2022-10-17 DIAGNOSIS — C3431 Malignant neoplasm of lower lobe, right bronchus or lung: Secondary | ICD-10-CM | POA: Diagnosis not present

## 2022-10-17 DIAGNOSIS — R0602 Shortness of breath: Secondary | ICD-10-CM | POA: Diagnosis not present

## 2022-10-17 DIAGNOSIS — I10 Essential (primary) hypertension: Secondary | ICD-10-CM | POA: Diagnosis not present

## 2022-10-17 DIAGNOSIS — I498 Other specified cardiac arrhythmias: Secondary | ICD-10-CM | POA: Diagnosis not present

## 2022-10-17 NOTE — Patient Instructions (Signed)
Medication Instructions:  Your physician recommends that you continue on your current medications as directed. Please refer to the Current Medication list given to you today. *If you need a refill on your cardiac medications before your next appointment, please call your pharmacy*   Lab Work: None Ordered   Testing/Procedures: None ordered   Follow-Up: At [facility], you and your health needs are our priority.  As part of our continuing mission to provide you with exceptional heart care, we have created designated Provider Care Teams.  These Care Teams include your primary Cardiologist (physician) and Advanced Practice Providers (APPs -  Physician Assistants and Nurse Practitioners) who all work together to provide you with the care you need, when you need it.  We recommend signing up for the patient portal called "MyChart".  Sign up information is provided on this After Visit Summary.  MyChart is used to connect with patients for Virtual Visits (Telemedicine).  Patients are able to view lab/test results, encounter notes, upcoming appointments, etc.  Non-urgent messages can be sent to your provider as well.   To learn more about what you can do with MyChart, go to [email or web address].    Your next appointment:   6 month(s)  The format for your next appointment:   In Person  Provider:   Virl Axe, MD    Other Instructions   Important Information About Sugar

## 2022-10-21 DIAGNOSIS — I498 Other specified cardiac arrhythmias: Secondary | ICD-10-CM | POA: Diagnosis not present

## 2022-10-21 DIAGNOSIS — R000 Tachycardia, unspecified: Secondary | ICD-10-CM | POA: Diagnosis not present

## 2022-11-01 ENCOUNTER — Telehealth: Payer: Self-pay | Admitting: Emergency Medicine

## 2022-11-01 NOTE — Telephone Encounter (Signed)
Left message for patient to call back to schedule Medicare Annual Wellness Visit   No hx of AWV eligible as of 08/29/15  Please schedule at anytime with [facility] if patient calls the office back.     Any questions, please call me at [phone]

## 2022-11-09 ENCOUNTER — Ambulatory Visit: Payer: Medicare Other | Admitting: Physician Assistant

## 2022-11-14 ENCOUNTER — Ambulatory Visit: Payer: Medicare Other | Admitting: Student

## 2022-12-01 ENCOUNTER — Emergency Department (HOSPITAL_COMMUNITY)
Admission: EM | Admit: 2022-12-01 | Discharge: 2022-12-01 | Disposition: A | Discharge status: Home / Self Care | Payer: Medicare Other | Attending: Emergency Medicine | Admitting: Emergency Medicine

## 2022-12-01 ENCOUNTER — Encounter (HOSPITAL_COMMUNITY): Payer: Self-pay

## 2022-12-01 ENCOUNTER — Encounter: Payer: Self-pay | Admitting: Internal Medicine

## 2022-12-01 ENCOUNTER — Emergency Department (HOSPITAL_COMMUNITY): Payer: Medicare Other

## 2022-12-01 ENCOUNTER — Other Ambulatory Visit: Payer: Self-pay

## 2022-12-01 DIAGNOSIS — C3490 Malignant neoplasm of unspecified part of unspecified bronchus or lung: Secondary | ICD-10-CM | POA: Insufficient documentation

## 2022-12-01 DIAGNOSIS — J90 Pleural effusion, not elsewhere classified: Secondary | ICD-10-CM | POA: Diagnosis not present

## 2022-12-01 DIAGNOSIS — U071 COVID-19: Secondary | ICD-10-CM | POA: Insufficient documentation

## 2022-12-01 DIAGNOSIS — R059 Cough, unspecified: Secondary | ICD-10-CM | POA: Diagnosis present

## 2022-12-01 LAB — RESP PANEL BY RT-PCR (RSV, FLU A&B, COVID)  RVPGX2
Influenza A by PCR: NEGATIVE
Influenza B by PCR: NEGATIVE
Resp Syncytial Virus by PCR: NEGATIVE
SARS Coronavirus 2 by RT PCR: POSITIVE — AB

## 2022-12-01 LAB — GROUP A STREP BY PCR: Group A Strep by PCR: NOT DETECTED

## 2022-12-01 MED ORDER — ONDANSETRON 4 MG PO TBDP: ORAL_TABLET | ORAL | 0 refills | Status: DC

## 2022-12-01 MED ORDER — BENZONATATE 100 MG PO CAPS: 100.0000 mg | ORAL_CAPSULE | Freq: Three times a day (TID) | ORAL | 0 refills | Status: DC

## 2022-12-01 MED ORDER — LIDOCAINE VISCOUS HCL 2 % MT SOLN
15.0000 mL | Freq: Once | OROMUCOSAL | Status: AC
  Administered 2022-12-01: 15 mL via OROMUCOSAL
  Filled 2022-12-01: qty 15

## 2022-12-01 MED ORDER — ACETAMINOPHEN 325 MG PO TABS
650.0000 mg | ORAL_TABLET | Freq: Once | ORAL | Status: AC
  Administered 2022-12-01: 650 mg via ORAL
  Filled 2022-12-01: qty 2

## 2022-12-01 NOTE — ED Provider Notes (Signed)
[facility] DEPT Provider Note   CSN: [id number] Arrival date & time: 12/01/22  1544     History  Chief Complaint  Patient presents with   Sore Throat   Cough   Chills    [name] is a 62 y.o. female.  62 yo F with a chief complaints of cough congestion fever.  Going on for couple days.  Has a history of lung cancer and is on chemotherapy.  No known sick contacts.  Has felt a bit fatigued and complaining mostly of sore throat.   Sore Throat  Cough      Home Medications Prior to Admission medications   Medication Sig Start Date End Date Taking? Authorizing Provider  benzonatate (TESSALON) 100 MG capsule Take 1 capsule (100 mg total) by mouth every 8 (eight) hours. 12/01/22  Yes Deno Etienne, DO  ondansetron (ZOFRAN-ODT) 4 MG disintegrating tablet 4mg  ODT q4 hours prn nausea/vomit 12/01/22  Yes Deno Etienne, DO  acetaminophen (TYLENOL) 500 MG tablet Take 500 mg every 6 (six) hours as needed by mouth.    [provider]  albuterol (PROAIR HFA) 108 (90 Base) MCG/ACT inhaler Inhale 1-2 puffs into the lungs every 6 (six) hours as needed for wheezing or shortness of breath. 12/24/18   Curt Bears, MD  chlorpheniramine-HYDROcodone (TUSSIONEX) 10-8 MG/5ML Take 5 mLs by mouth every 12 (twelve) hours as needed for cough. 10/10/22   Curt Bears, MD  clindamycin (CLEOCIN T) 1 % lotion Apply topically 2 (two) times daily. 09/26/22   Curt Bears, MD  diltiazem (CARDIZEM) 60 MG tablet Take one tablet by mouth at 4pm as needed 10/04/22   Deboraha Sprang, MD  diltiazem (TIADYLT ER) 120 MG 24 hr capsule Take 1 capsule by mouth daily. 09/21/22   Deboraha Sprang, MD  erlotinib (TARCEVA) 150 MG tablet Take 1 tablet (150 mg total) by mouth daily. Take on an empty stomach at least 1 hour before or 2 hours after food. 04/15/22   Curt Bears, MD  lidocaine-prilocaine (EMLA) cream Apply 1 application topically as needed. 04/30/18   Curt Bears, MD       Allergies    Codeine and Doxycycline    Review of Systems   Review of Systems  Respiratory:  Positive for cough.     Physical Exam Updated Vital Signs BP (!) 118/57 (BP Location: Right Arm)   Pulse 97   Temp (!) 102.8 F (39.3 C) (Oral)   Resp 18   Ht 4\' 11"  (1.499 m)   Wt 54 kg   LMP 02/10/2012   SpO2 95%   BMI 24.04 kg/m  Physical Exam Vitals and nursing note reviewed.  Constitutional:      General: She is not in acute distress.    Appearance: She is well-developed. She is not diaphoretic.  HENT:     Head: Normocephalic and atraumatic.     Comments: Swollen turbinates, posterior nasal drip, no noted sinus ttp, tm normal bilaterally.   Eyes:     Pupils: Pupils are equal, round, and reactive to light.  Cardiovascular:     Rate and Rhythm: Normal rate and regular rhythm.     Heart sounds: No murmur heard.    No friction rub. No gallop.  Pulmonary:     Effort: Pulmonary effort is normal.     Breath sounds: No wheezing or rales.  Abdominal:     General: There is no distension.     Palpations: Abdomen is soft.  Tenderness: There is no abdominal tenderness.  Musculoskeletal:        General: No tenderness.     Cervical back: Normal range of motion and neck supple.  Skin:    General: Skin is warm and dry.  Neurological:     Mental Status: She is alert and oriented to person, place, and time.  Psychiatric:        Behavior: Behavior normal.     ED Results / Procedures / Treatments   Labs (all labs ordered are listed, but only abnormal results are displayed) Labs Reviewed  RESP PANEL BY RT-PCR (RSV, FLU A&B, COVID)  RVPGX2 - Abnormal; Notable for the following components:      Result Value   SARS Coronavirus 2 by RT PCR POSITIVE (*)    All other components within normal limits  GROUP A STREP BY PCR    EKG None  Radiology DG Chest 2 View  Result Date: 12/01/2022 CLINICAL DATA:  Cough and fever.  Lung cancer EXAM: CHEST - 2 VIEW COMPARISON:  Chest  x-ray 05/07/2022.  CT 09/19/2022 FINDINGS: Stable left IJ chest port with tip at the SVC right atrial junction. Stable cardiopericardial silhouette with tortuous ectatic aorta. No edema, pneumothorax or consolidation. Persistent small right pleural effusion. Cut off appearance of the right main bronchus with surgical changes. IMPRESSION: No significant interval change. Surgical changes in the right lung with a small right pleural effusion. Chest port Electronically Signed   By: Jill Side M.D.   On: 12/01/2022 16:56    Procedures Procedures    Medications Ordered in ED Medications  lidocaine (XYLOCAINE) 2 % viscous mouth solution 15 mL (has no administration in time range)  acetaminophen (TYLENOL) tablet 650 mg (650 mg Oral Given 12/01/22 2019)    ED Course/ Medical Decision Making/ A&P                           Medical Decision Making Risk Prescription drug management.   62 yo F with a chief complaints of what sounds like upper respiratory illness.  Cough congestion fever sore throat going on for about 3 days now.  Exam most consistent with a viral syndrome.  COVID test came back positive.  I discussed risks and benefits of Paxlovid currently declining.  Will have her follow-up with her PCP in the office.  Chest x-ray independently reviewed by me without focal protrusion pneumothorax.  No significant anemia no significant electrolyte abnormality.  11:06 PM:  I have discussed the diagnosis/risks/treatment options with the patient and family.  Evaluation and diagnostic testing in the emergency department does not suggest an emergent condition requiring admission or immediate intervention beyond what has been performed at this time.  They will follow up with PCP. We also discussed returning to the ED immediately if new or worsening sx occur. We discussed the sx which are most concerning (e.g., sudden worsening pain, fever, inability to tolerate by mouth) that necessitate immediate return.  Medications administered to the patient during their visit and any new prescriptions provided to the patient are listed below.  Medications given during this visit Medications  lidocaine (XYLOCAINE) 2 % viscous mouth solution 15 mL (has no administration in time range)  acetaminophen (TYLENOL) tablet 650 mg (650 mg Oral Given 12/01/22 2019)     The patient appears reasonably screen and/or stabilized for discharge and I doubt any other medical condition or other [facility] requiring further screening, evaluation, or treatment in the ED  at this time prior to discharge.          Final Clinical Impression(s) / ED Diagnoses Final diagnoses:  [id number] virus infection    Rx / DC Orders ED Discharge Orders          Ordered    benzonatate (TESSALON) 100 MG capsule  Every 8 hours        12/01/22 2257    ondansetron (ZOFRAN-ODT) 4 MG disintegrating tablet        12/01/22 2257              Deno Etienne, DO 12/01/22 2308

## 2022-12-01 NOTE — Discharge Instructions (Signed)
Take tylenol 2 pills 4 times a day and motrin 4 pills 3 times a day.  Drink plenty of fluids.  Return for worsening shortness of breath, headache, confusion. Follow up with your family doctor.   

## 2022-12-01 NOTE — ED Triage Notes (Addendum)
Patient c/o chills, sore throat, and a productive cough with "spots of blood " in the sputum x 3 days.  Patient is currently taking oral chemo pills due to lung cancer.

## 2022-12-01 NOTE — ED Provider Triage Note (Signed)
Emergency Medicine Provider Triage Evaluation Note  [name] , a 62 y.o. female  was evaluated in triage.  Daughter translating. Pt c/o cough, congestion, and sore throat for 3 days. Positive sick contacts in family. Mild streaky hemoptysis for 2 days. Subjective fever and chills for 2 days. Currently on oral chemo for lung cancer. No nausea, vomiting, diarrhea, abdominal pain, change in appetite, headache, sinus pain, chest pain, or SOB.   Review of Systems  Positive: See HPI Negative: See HPI  Physical Exam  BP 114/66 (BP Location: Left Arm)   Pulse (!) 55   Temp 100 F (37.8 C) (Oral)   Resp 16   Ht 4\' 11"  (1.499 m)   Wt 54 kg   LMP 02/10/2012   SpO2 100%   BMI 24.04 kg/m  Gen:   Awake, no distress   Resp:  Normal effort LCTA MSK:   Moves extremities without difficulty  Other:  Moderate pharyngeal erythema with no exudates, patent airway  Medical Decision Making  Medically screening exam initiated at 4:35 PM.  Appropriate orders placed.  Deya Bigos was informed that the remainder of the evaluation will be completed by another provider, this initial triage assessment does not replace that evaluation, and the importance of remaining in the ED until their evaluation is complete.     Suzzette Righter, PA-C 12/01/22 1637

## 2022-12-05 ENCOUNTER — Encounter: Payer: Medicare Other | Admitting: Emergency Medicine

## 2022-12-07 ENCOUNTER — Ambulatory Visit: Payer: Medicare Other | Admitting: Student

## 2022-12-07 ENCOUNTER — Encounter: Payer: Self-pay | Admitting: *Deleted

## 2022-12-07 ENCOUNTER — Telehealth: Payer: Self-pay | Admitting: *Deleted

## 2022-12-07 NOTE — Patient Outreach (Signed)
  Care Coordination [facility] Note Transition Care Management Unsuccessful Follow-up Telephone Call  Date of discharge and from where:  Thursday, 12/01/22- ED Visit [facility]; COVID-19; ED EMMI Red Alert notification; "No scheduled follow up; No discharge instructions"  Attempts:  1st Attempt  Reason for unsuccessful TCM follow-up call:  Left voice message significant review completed prior to placing call- patient noted to have language barrier; however, according to provider and previous encounter notes, patient has asked that her daughter be called; noted no [facility] DPR on file designating specific instructions, however, daughter's phone number is listed as preferred phone number for patient; attempted call and left voice message requesting call-back-- if call back is received, will utilize interpreter services as indicated; will re-attempt call again tomorrow  Oneta Rack, RN, BSN, CCRN Alumnus RN CM Care Coordination/ Transition of [place] Management [phone]: direct office

## 2022-12-08 ENCOUNTER — Telehealth: Payer: Self-pay | Admitting: *Deleted

## 2022-12-08 ENCOUNTER — Encounter: Payer: Self-pay | Admitting: *Deleted

## 2022-12-08 NOTE — Patient Outreach (Signed)
  Care Coordination [facility] Note Transition Care Management Unsuccessful Follow-up Telephone Call  Date of discharge and from where:  Thursday, 12/01/22- ED Visit [facility]; COVID-19; ED EMMI Red Alert notification; "No scheduled follow up; No discharge instructions"  Attempts:  2nd Attempt  Reason for unsuccessful TCM follow-up call:  Left voice message   Left voice message-- on 12/07/22-- significant review completed prior to placing call- patient noted to have language barrier; however, according to provider and previous encounter notes, patient has asked that her daughter be called; noted no [facility] DPR on file designating specific instructions, however, daughter's phone number is listed as preferred phone number for patient; attempted call and left voice message requesting call-back-- if call back is received, will utilize interpreter services as indicated; will re-attempt call again tomorrow for final attempt  Oneta Rack, RN, BSN, CCRN Alumnus RN CM Care Coordination/ Transition of [place] Management [phone]: direct office

## 2022-12-09 ENCOUNTER — Encounter: Payer: Self-pay | Admitting: *Deleted

## 2022-12-09 ENCOUNTER — Telehealth: Payer: Self-pay | Admitting: *Deleted

## 2022-12-09 ENCOUNTER — Telehealth: Payer: Self-pay | Admitting: Internal Medicine

## 2022-12-09 MED ORDER — DILTIAZEM HCL ER BEADS 120 MG PO CP24: 120.0000 mg | ORAL_CAPSULE | Freq: Every day | ORAL | 3 refills | Status: DC

## 2022-12-09 NOTE — Patient Outreach (Signed)
  Care Coordination [facility] Note Transition Care Management Unsuccessful Follow-up Telephone Call  Date of discharge and from where:  Thursday, 12/01/22- ED Visit [facility]; COVID-19; ED EMMI Red Alert notification; "No scheduled follow up; No discharge instructions"   Attempts:  3rd Attempt  Reason for unsuccessful TCM follow-up call:  Left voice message  on 12/07/22-- significant review completed prior to placing call- patient noted to have language barrier; however, according to provider and previous encounter notes, patient has asked that her daughter be called; noted no [facility] DPR on file designating specific instructions, however, daughter's phone number is listed as preferred phone number for patient; attempted call and left voice message requesting call-back-- if call back is received, will utilize interpreter services as indicated   Caryl Pina, RN, BSN, CCRN Alumnus RN CM Care Coordination/ Transition of Care- [facility] Care Management [phone]: direct office

## 2022-12-09 NOTE — Telephone Encounter (Signed)
*  STAT* If patient is at the pharmacy, call can be transferred to refill team.   1. Which medications need to be refilled? (please list name of each medication and dose if known)  diltiazem (TIADYLT ER) 120 MG 24 hr capsule   2. Which pharmacy/location (including street and city if local pharmacy) is medication to be sent to? [id number] - [place], [place] - [address]   3. Do they need a 30 day or 90 day supply? 90 day  Patient is out of medication

## 2022-12-09 NOTE — Telephone Encounter (Signed)
Pt's medication was sent to pt's pharmacy as requested. Confirmation received.

## 2022-12-16 ENCOUNTER — Other Ambulatory Visit: Payer: Self-pay

## 2022-12-16 DIAGNOSIS — C3431 Malignant neoplasm of lower lobe, right bronchus or lung: Secondary | ICD-10-CM

## 2022-12-19 ENCOUNTER — Ambulatory Visit (INDEPENDENT_AMBULATORY_CARE_PROVIDER_SITE_OTHER): Payer: Medicare Other | Admitting: Emergency Medicine

## 2022-12-19 ENCOUNTER — Ambulatory Visit (INDEPENDENT_AMBULATORY_CARE_PROVIDER_SITE_OTHER): Payer: Medicare Other

## 2022-12-19 ENCOUNTER — Encounter: Payer: Self-pay | Admitting: Emergency Medicine

## 2022-12-19 VITALS — BP 118/74 | HR 86 | Temp 98.3°F | Ht 59.0 in | Wt 118.4 lb

## 2022-12-19 DIAGNOSIS — I498 Other specified cardiac arrhythmias: Secondary | ICD-10-CM

## 2022-12-19 DIAGNOSIS — J90 Pleural effusion, not elsewhere classified: Secondary | ICD-10-CM | POA: Diagnosis not present

## 2022-12-19 DIAGNOSIS — R059 Cough, unspecified: Secondary | ICD-10-CM | POA: Diagnosis not present

## 2022-12-19 DIAGNOSIS — C3431 Malignant neoplasm of lower lobe, right bronchus or lung: Secondary | ICD-10-CM | POA: Diagnosis not present

## 2022-12-19 DIAGNOSIS — U071 COVID-19: Secondary | ICD-10-CM | POA: Insufficient documentation

## 2022-12-19 DIAGNOSIS — Z1231 Encounter for screening mammogram for malignant neoplasm of breast: Secondary | ICD-10-CM | POA: Diagnosis not present

## 2022-12-19 DIAGNOSIS — Z1211 Encounter for screening for malignant neoplasm of colon: Secondary | ICD-10-CM

## 2022-12-19 DIAGNOSIS — I10 Essential (primary) hypertension: Secondary | ICD-10-CM | POA: Diagnosis not present

## 2022-12-19 DIAGNOSIS — R053 Chronic cough: Secondary | ICD-10-CM | POA: Diagnosis not present

## 2022-12-19 DIAGNOSIS — K219 Gastro-esophageal reflux disease without esophagitis: Secondary | ICD-10-CM | POA: Diagnosis not present

## 2022-12-19 LAB — CBC WITH DIFFERENTIAL/PLATELET
Basophils Absolute: 0.1 10*3/uL (ref 0.0–0.1)
Basophils Relative: 0.6 % (ref 0.0–3.0)
Eosinophils Absolute: 0.1 10*3/uL (ref 0.0–0.7)
Eosinophils Relative: 0.7 % (ref 0.0–5.0)
HCT: 44.3 % (ref 36.0–46.0)
Hemoglobin: 14.5 g/dL (ref 12.0–15.0)
Lymphocytes Relative: 23.7 % (ref 12.0–46.0)
Lymphs Abs: 2.1 10*3/uL (ref 0.7–4.0)
MCHC: 32.6 g/dL (ref 30.0–36.0)
MCV: 85.4 fl (ref 78.0–100.0)
Monocytes Absolute: 0.6 10*3/uL (ref 0.1–1.0)
Monocytes Relative: 6.7 % (ref 3.0–12.0)
Neutro Abs: 6.1 10*3/uL (ref 1.4–7.7)
Neutrophils Relative %: 68.3 % (ref 43.0–77.0)
Platelets: 343 10*3/uL (ref 150.0–400.0)
RBC: 5.19 Mil/uL — ABNORMAL HIGH (ref 3.87–5.11)
RDW: 14.4 % (ref 11.5–15.5)
WBC: 8.9 10*3/uL (ref 4.0–10.5)

## 2022-12-19 LAB — COMPREHENSIVE METABOLIC PANEL
ALT: 11 U/L (ref 0–35)
AST: 21 U/L (ref 0–37)
Albumin: 4.5 g/dL (ref 3.5–5.2)
Alkaline Phosphatase: 65 U/L (ref 39–117)
BUN: 9 mg/dL (ref 6–23)
CO2: 26 mEq/L (ref 19–32)
Calcium: 9.3 mg/dL (ref 8.4–10.5)
Chloride: 103 mEq/L (ref 96–112)
Creatinine, Ser: 0.51 mg/dL (ref 0.40–1.20)
GFR: 101.13 mL/min (ref >60.00)
Glucose, Bld: 96 mg/dL (ref 70–99)
Potassium: 3.8 mEq/L (ref 3.5–5.1)
Sodium: 138 mEq/L (ref 135–145)
Total Bilirubin: 1.1 mg/dL (ref 0.2–1.2)
Total Protein: 8.1 g/dL (ref 6.0–8.3)

## 2022-12-19 LAB — LIPID PANEL
Cholesterol: 209 mg/dL — ABNORMAL HIGH (ref 0–200)
HDL: 60.7 mg/dL (ref >39.00)
LDL Cholesterol: 116 mg/dL — ABNORMAL HIGH (ref 0–99)
NonHDL: 148.05
Total CHOL/HDL Ratio: 3
Triglycerides: 158 mg/dL — ABNORMAL HIGH (ref 0.0–149.0)
VLDL: 31.6 mg/dL (ref 0.0–40.0)

## 2022-12-19 LAB — POC COVID19 BINAXNOW: SARS Coronavirus 2 Ag: NEGATIVE

## 2022-12-19 MED ORDER — HYDROCOD POLI-CHLORPHE POLI ER 10-8 MG/5ML PO SUER: 5.0000 mL | Freq: Two times a day (BID) | ORAL | 0 refills | Status: DC | PRN

## 2022-12-19 NOTE — Progress Notes (Signed)
[name] 62 y.o.   Chief Complaint  Patient presents with   Annual Exam    HISTORY OF PRESENT ILLNESS: This is a 62 y.o. female here for follow-up of chronic medical problems including hypertension. Recent COVID infection.  Seen in the emergency department 12/01/2022.  Still having some residual cough but overall recovering well Has history of lung cancer.  Doing well. Most recent cardiologist office visit notes assessment and plan as follows: Assessment & Plan    1.  History of SVT with ectopic atrial tachycardia: -Today patient reports that her shortness of breath and tingling in hands and feet have resolved.  She also reports decreased palpitations since previous visit -Continue as needed and Cardizem ER 120 mg daily -ZIO monitor was ordered however has not resulted as of yet.   2.  Shortness of breath: -Today patient reports no shortness of breath today.   3.  History of non-small cell lung cancer: -s/p RLL lobectomy with radiation in 2010 currently treated with Tarceva  -Shortness of breath has improved since previous visit   4.  Essential hypertension: -Blood pressures today were much improved at 112/60.  Blood pressure readings indicated dental pain as primary reason for elevation last appointment. -Continue CCB as noted above   Disposition: Follow-up with None or APP in 6 months     Medication Adjustments/Labs and Tests Ordered: Current medicines are reviewed at length with the patient today.  Concerns regarding medicines are outlined above.    Signed, Napoleon Form, [name], NP 10/15/2022, 2:09 PM [facility]   HPI   Prior to Admission medications   Medication Sig Start Date End Date Taking? Authorizing Provider  chlorpheniramine-HYDROcodone (TUSSIONEX) 10-8 MG/5ML Take 5 mLs by mouth every 12 (twelve) hours as needed for cough. 12/19/22  Yes [name], Kathryn L, PA-C  diltiazem (TIADYLT ER) 120 MG 24 hr capsule Take 1 capsule by  mouth daily. 12/09/22  Yes Kathryn Salvia, MD  erlotinib (TARCEVA) 150 MG tablet Take 1 tablet (150 mg total) by mouth daily. Take on an empty stomach at least 1 hour before or 2 hours after food. 04/15/22  Yes Kathryn Gaul, MD  albuterol [facility] HFA) 108 [phone] Base) MCG/ACT inhaler Inhale 1-2 puffs into the lungs every 6 (six) hours as needed for wheezing or shortness of breath. 12/24/18   Kathryn Gaul, MD    Allergies  Allergen Reactions   Codeine Nausea Only   Doxycycline Nausea And Vomiting    vomiting and headache    Patient Active Problem List   Diagnosis Date Noted   Weight loss 02/21/2022   Seasonal allergic rhinitis due to pollen 06/15/2017   Essential hypertension 05/23/2017   Atrial arrhythmia 05/21/2017   Port catheter in place 12/27/2016   Encounter for antineoplastic chemotherapy 06/13/2016   GERD (gastroesophageal reflux disease) 04/18/2016   Primary cancer of right lower lobe of lung (HCC) 05/06/2009   LEIOMYOMA, UTERUS 05/06/2009    Past Medical History:  Diagnosis Date   Drug-induced skin rash 02/27/2017   Encounter for antineoplastic chemotherapy 01/19/2016   History of radiation therapy 05/02/11 to 06/08/11   right lung   Lung cancer [facility])    right lower lobe adenocarcinoma    Past Surgical History:  Procedure Laterality Date   LUNG LOBECTOMY  04/18/2009   RLL   PORTACATH PLACEMENT  02/29/2012   Procedure: INSERTION PORT-A-CATH;  Surgeon: Ines Bloomer, MD;  Location: [facility] OR;  Service: Thoracic;  Laterality: Left;  PowerPort  8 F attachable in left internal jugular.    Social History   Socioeconomic History   Marital status: Married    Spouse name: Not on file   Number of children: 7   Years of education: Not on file   Highest education level: Not on file  Occupational History   Not on file  Tobacco Use   Smoking status: Never   Smokeless tobacco: Never  Vaping Use   Vaping Use: Never used  Substance and Sexual Activity   Alcohol use: No    Drug use: No   Sexual activity: Yes  Other Topics Concern   Not on file  Social History Narrative   09/01/2014   The patient is a 62 year old [place] woman.   The patient is married and has 7 children. One child passed away.   The patient came to the [place] in approximately 1990. They were in [place] for 10-11 years and then moved to [place], [place] since then.   Patient does not smoke, drink, or use illicit drugs.   Patient has not used smokeless tobacco products.   Fun/Hobby: Go shopping    Social Determinants of Health   Financial Resource Strain: Not on file  Food Insecurity: Not on file  Transportation Needs: Not on file  Physical Activity: Not on file  Stress: Not on file  Social Connections: Not on file  Intimate Partner Violence: Not on file    Family History  Problem Relation Age of Onset   Heart Problems Mother    Cancer Neg Hx      Review of Systems  Constitutional: Negative.  Negative for chills and fever.  HENT: Negative.  Negative for sore throat.   Respiratory:  Positive for cough. Negative for sputum production and shortness of breath.   Cardiovascular:  Positive for palpitations.  Gastrointestinal:  Negative for abdominal pain, nausea and vomiting.  Genitourinary: Negative.  Negative for dysuria.  Skin: Negative.  Negative for rash.  Neurological: Negative.  Negative for dizziness and headaches.  All other systems reviewed and are negative.  Today's Vitals   12/19/22 1304  BP: 118/74  Pulse: 86  Temp: 98.3 F (36.8 C)  TempSrc: Oral  SpO2: 97%  Weight: 118 lb 6 oz (53.7 kg)  Height: 4\' 11"  (1.499 m)   Body mass index is 23.91 kg/m.   Physical Exam Vitals reviewed.  Constitutional:      Appearance: Normal appearance.  HENT:     Head: Normocephalic.     Right Ear: Tympanic membrane, ear canal and external ear normal.     Left Ear: Tympanic membrane, ear canal and external ear normal.     Mouth/Throat:     Mouth:  Mucous membranes are moist.     Pharynx: Oropharynx is clear.  Eyes:     Extraocular Movements: Extraocular movements intact.     Conjunctiva/sclera: Conjunctivae normal.     Pupils: Pupils are equal, round, and reactive to light.  Cardiovascular:     Rate and Rhythm: Normal rate and regular rhythm.     Pulses: Normal pulses.     Heart sounds: Normal heart sounds.  Pulmonary:     Effort: Pulmonary effort is normal.     Breath sounds: Normal breath sounds.  Musculoskeletal:     Cervical back: No tenderness.     Right lower leg: No edema.     Left lower leg: No edema.  Lymphadenopathy:     Cervical: No cervical adenopathy.  Skin:  General: Skin is warm and dry.     Capillary Refill: Capillary refill takes less than 2 seconds.  Neurological:     General: No focal deficit present.     Mental Status: She is alert and oriented to person, place, and time.  Psychiatric:        Mood and Affect: Mood normal.        Behavior: Behavior normal.      ASSESSMENT & PLAN: A total of 46 minutes was spent with the patient and counseling/coordination of care regarding preparing for this visit, review of most recent office visit notes, review of most recent emergency department visit notes, review of most recent cardiologist office visit notes, review of multiple chronic medical conditions and their management, review of all medications, diagnosis of recent COVID infection and management, education on nutrition, review of health maintenance items and need for breast and colon cancer screening, prognosis, documentation, and need for follow-up.  Problem List Items Addressed This Visit       Cardiovascular and Mediastinum   Atrial arrhythmia    Stable.  Recently evaluated by cardiologist. Office notes, assessment and plan reviewed.      Essential hypertension - Primary    Well-controlled hypertension. Continue daily diltiazem 120 mg Cardiovascular risks associated with hypertension  discussed. Dietary approaches to stop hypertension discussed.      Relevant Orders   Comprehensive metabolic panel   Lipid panel     Respiratory   Primary cancer of right lower lobe of lung (HCC)    Stable.  Asymptomatic. Continues Tarceva 150 mg daily Follows up with oncologist on a regular basis.        Digestive   GERD (gastroesophageal reflux disease)    Stable and asymptomatic.  On no medications.        Other   COVID-19 virus infection    Recently diagnosed.  No antiviral treatment was recommended Recovering well.  No complications. No red flag signs or symptoms but still has a persistent cough. Chest x-ray done today.      Relevant Medications   chlorpheniramine-HYDROcodone (TUSSIONEX) 10-8 MG/5ML   Other Relevant Orders   CBC with Differential/Platelet   DG Chest 2 View   Other Visit Diagnoses     Colon cancer screening       Relevant Orders   Ambulatory referral to Gastroenterology   Encounter for screening mammogram for malignant neoplasm of breast       Relevant Orders   MM Digital Screening   Persistent cough       Relevant Medications   chlorpheniramine-HYDROcodone (TUSSIONEX) 10-8 MG/5ML   Other Relevant Orders   DG Chest 2 View      Patient Instructions  Hypertension, Adult High blood pressure (hypertension) is when the force of blood pumping through the arteries is too strong. The arteries are the blood vessels that carry blood from the heart throughout the body. Hypertension forces the heart to work harder to pump blood and may cause arteries to become narrow or stiff. Untreated or uncontrolled hypertension can lead to a heart attack, heart failure, a stroke, kidney disease, and other problems. A blood pressure reading consists of a higher number over a lower number. Ideally, your blood pressure should be below 120/80. The first ("top") number is called the systolic pressure. It is a measure of the pressure in your arteries as your heart beats.  The second ("bottom") number is called the diastolic pressure. It is a measure of the pressure in your  arteries as the heart relaxes. What are the causes? The exact cause of this condition is not known. There are some conditions that result in high blood pressure. What increases the risk? Certain factors may make you more likely to develop high blood pressure. Some of these risk factors are under your control, including: Smoking. Not getting enough exercise or physical activity. Being overweight. Having too much fat, sugar, calories, or salt (sodium) in your diet. Drinking too much alcohol. Other risk factors include: Having a personal history of heart disease, diabetes, high cholesterol, or kidney disease. Stress. Having a family history of high blood pressure and high cholesterol. Having obstructive sleep apnea. Age. The risk increases with age. What are the signs or symptoms? High blood pressure may not cause symptoms. Very high blood pressure (hypertensive crisis) may cause: Headache. Fast or irregular heartbeats (palpitations). Shortness of breath. Nosebleed. Nausea and vomiting. Vision changes. Severe chest pain, dizziness, and seizures. How is this diagnosed? This condition is diagnosed by measuring your blood pressure while you are seated, with your arm resting on a flat surface, your legs uncrossed, and your feet flat on the floor. The cuff of the blood pressure monitor will be placed directly against the skin of your upper arm at the level of your heart. Blood pressure should be measured at least twice using the same arm. Certain conditions can cause a difference in blood pressure between your right and left arms. If you have a high blood pressure reading during one visit or you have normal blood pressure with other risk factors, you may be asked to: Return on a different day to have your blood pressure checked again. Monitor your blood pressure at home for 1 week or  longer. If you are diagnosed with hypertension, you may have other blood or imaging tests to help your health care provider understand your overall risk for other conditions. How is this treated? This condition is treated by making healthy lifestyle changes, such as eating healthy foods, exercising more, and reducing your alcohol intake. You may be referred for counseling on a healthy diet and physical activity. Your health care provider may prescribe medicine if lifestyle changes are not enough to get your blood pressure under control and if: Your systolic blood pressure is above 130. Your diastolic blood pressure is above 80. Your personal target blood pressure may vary depending on your medical conditions, your age, and other factors. Follow these instructions at home: Eating and drinking  Eat a diet that is high in fiber and potassium, and low in sodium, added sugar, and fat. An example of this eating plan is called the DASH diet. DASH stands for Dietary Approaches to Stop Hypertension. To eat this way: Eat plenty of fresh fruits and vegetables. Try to fill one half of your plate at each meal with fruits and vegetables. Eat whole grains, such as whole-wheat pasta, brown rice, or whole-grain bread. Fill about one fourth of your plate with whole grains. Eat or drink low-fat dairy products, such as skim milk or low-fat yogurt. Avoid fatty cuts of meat, processed or cured meats, and poultry with skin. Fill about one fourth of your plate with lean proteins, such as fish, chicken without skin, beans, eggs, or tofu. Avoid pre-made and processed foods. These tend to be higher in sodium, added sugar, and fat. Reduce your daily sodium intake. Many people with hypertension should eat less than 1,500 mg of sodium a day. Do not drink alcohol if: Your health care provider tells  you not to drink. You are pregnant, may be pregnant, or are planning to become pregnant. If you drink alcohol: Limit how much  you have to: 0-1 drink a day for women. 0-2 drinks a day for men. Know how much alcohol is in your drink. In the [place] one drink equals one 12 oz bottle of beer (355 mL), one 5 oz glass of wine (148 mL), or one 1 oz glass of hard liquor (44 mL). Lifestyle  Work with your health care provider to maintain a healthy body weight or to lose weight. Ask what an ideal weight is for you. Get at least 30 minutes of exercise that causes your heart to beat faster (aerobic exercise) most days of the week. Activities may include walking, swimming, or biking. Include exercise to strengthen your muscles (resistance exercise), such as Pilates or lifting weights, as part of your weekly exercise routine. Try to do these types of exercises for 30 minutes at least 3 days a week. Do not use any products that contain nicotine or tobacco. These products include cigarettes, chewing tobacco, and vaping devices, such as e-cigarettes. If you need help quitting, ask your health care provider. Monitor your blood pressure at home as told by your health care provider. Keep all follow-up visits. This is important. Medicines Take over-the-counter and prescription medicines only as told by your health care provider. Follow directions carefully. Blood pressure medicines must be taken as prescribed. Do not skip doses of blood pressure medicine. Doing this puts you at risk for problems and can make the medicine less effective. Ask your health care provider about side effects or reactions to medicines that you should watch for. Contact a health care provider if you: Think you are having a reaction to a medicine you are taking. Have headaches that keep coming back (recurring). Feel dizzy. Have swelling in your ankles. Have trouble with your vision. Get help right away if you: Develop a severe headache or confusion. Have unusual weakness or numbness. Feel faint. Have severe pain in your chest or abdomen. Vomit repeatedly. Have  trouble breathing. These symptoms may be an emergency. Get help right away. Call 911. Do not wait to see if the symptoms will go away. Do not drive yourself to the hospital. Summary Hypertension is when the force of blood pumping through your arteries is too strong. If this condition is not controlled, it may put you at risk for serious complications. Your personal target blood pressure may vary depending on your medical conditions, your age, and other factors. For most people, a normal blood pressure is less than 120/80. Hypertension is treated with lifestyle changes, medicines, or a combination of both. Lifestyle changes include losing weight, eating a healthy, low-sodium diet, exercising more, and limiting alcohol. This information is not intended to replace advice given to you by your health care provider. Make sure you discuss any questions you have with your health care provider. Document Revised: 09/21/2021 Document Reviewed: 09/21/2021 Elsevier Patient Education  2023 [employer] Inc[employer]    Edwina Barth, MD [facility]

## 2022-12-19 NOTE — Assessment & Plan Note (Signed)
Stable.  Asymptomatic. Continues Tarceva 150 mg daily Follows up with oncologist on a regular basis.

## 2022-12-19 NOTE — Patient Instructions (Signed)
Hypertension, Adult High blood pressure (hypertension) is when the force of blood pumping through the arteries is too strong. The arteries are the blood vessels that carry blood from the heart throughout the body. Hypertension forces the heart to work harder to pump blood and may cause arteries to become narrow or stiff. Untreated or uncontrolled hypertension can lead to a heart attack, heart failure, a stroke, kidney disease, and other problems. A blood pressure reading consists of a higher number over a lower number. Ideally, your blood pressure should be below 120/80. The first ("top") number is called the systolic pressure. It is a measure of the pressure in your arteries as your heart beats. The second ("bottom") number is called the diastolic pressure. It is a measure of the pressure in your arteries as the heart relaxes. What are the causes? The exact cause of this condition is not known. There are some conditions that result in high blood pressure. What increases the risk? Certain factors may make you more likely to develop high blood pressure. Some of these risk factors are under your control, including: Smoking. Not getting enough exercise or physical activity. Being overweight. Having too much fat, sugar, calories, or salt (sodium) in your diet. Drinking too much alcohol. Other risk factors include: Having a personal history of heart disease, diabetes, high cholesterol, or kidney disease. Stress. Having a family history of high blood pressure and high cholesterol. Having obstructive sleep apnea. Age. The risk increases with age. What are the signs or symptoms? High blood pressure may not cause symptoms. Very high blood pressure (hypertensive crisis) may cause: Headache. Fast or irregular heartbeats (palpitations). Shortness of breath. Nosebleed. Nausea and vomiting. Vision changes. Severe chest pain, dizziness, and seizures. How is this diagnosed? This condition is diagnosed by  measuring your blood pressure while you are seated, with your arm resting on a flat surface, your legs uncrossed, and your feet flat on the floor. The cuff of the blood pressure monitor will be placed directly against the skin of your upper arm at the level of your heart. Blood pressure should be measured at least twice using the same arm. Certain conditions can cause a difference in blood pressure between your right and left arms. If you have a high blood pressure reading during one visit or you have normal blood pressure with other risk factors, you may be asked to: Return on a different day to have your blood pressure checked again. Monitor your blood pressure at home for 1 week or longer. If you are diagnosed with hypertension, you may have other blood or imaging tests to help your health care provider understand your overall risk for other conditions. How is this treated? This condition is treated by making healthy lifestyle changes, such as eating healthy foods, exercising more, and reducing your alcohol intake. You may be referred for counseling on a healthy diet and physical activity. Your health care provider may prescribe medicine if lifestyle changes are not enough to get your blood pressure under control and if: Your systolic blood pressure is above 130. Your diastolic blood pressure is above 80. Your personal target blood pressure may vary depending on your medical conditions, your age, and other factors. Follow these instructions at home: Eating and drinking  Eat a diet that is high in fiber and potassium, and low in sodium, added sugar, and fat. An example of this eating plan is called the DASH diet. DASH stands for Dietary Approaches to Stop Hypertension. To eat this way: Eat   plenty of fresh fruits and vegetables. Try to fill one half of your plate at each meal with fruits and vegetables. Eat whole grains, such as whole-wheat pasta, brown rice, or whole-grain bread. Fill about one  fourth of your plate with whole grains. Eat or drink low-fat dairy products, such as skim milk or low-fat yogurt. Avoid fatty cuts of meat, processed or cured meats, and poultry with skin. Fill about one fourth of your plate with lean proteins, such as fish, chicken without skin, beans, eggs, or tofu. Avoid pre-made and processed foods. These tend to be higher in sodium, added sugar, and fat. Reduce your daily sodium intake. Many people with hypertension should eat less than 1,500 mg of sodium a day. Do not drink alcohol if: Your health care provider tells you not to drink. You are pregnant, may be pregnant, or are planning to become pregnant. If you drink alcohol: Limit how much you have to: 0-1 drink a day for women. 0-2 drinks a day for men. Know how much alcohol is in your drink. In the [place] one drink equals one 12 oz bottle of beer (355 mL), one 5 oz glass of wine (148 mL), or one 1 oz glass of hard liquor (44 mL). Lifestyle  Work with your health care provider to maintain a healthy body weight or to lose weight. Ask what an ideal weight is for you. Get at least 30 minutes of exercise that causes your heart to beat faster (aerobic exercise) most days of the week. Activities may include walking, swimming, or biking. Include exercise to strengthen your muscles (resistance exercise), such as Pilates or lifting weights, as part of your weekly exercise routine. Try to do these types of exercises for 30 minutes at least 3 days a week. Do not use any products that contain nicotine or tobacco. These products include cigarettes, chewing tobacco, and vaping devices, such as e-cigarettes. If you need help quitting, ask your health care provider. Monitor your blood pressure at home as told by your health care provider. Keep all follow-up visits. This is important. Medicines Take over-the-counter and prescription medicines only as told by your health care provider. Follow directions carefully. Blood  pressure medicines must be taken as prescribed. Do not skip doses of blood pressure medicine. Doing this puts you at risk for problems and can make the medicine less effective. Ask your health care provider about side effects or reactions to medicines that you should watch for. Contact a health care provider if you: Think you are having a reaction to a medicine you are taking. Have headaches that keep coming back (recurring). Feel dizzy. Have swelling in your ankles. Have trouble with your vision. Get help right away if you: Develop a severe headache or confusion. Have unusual weakness or numbness. Feel faint. Have severe pain in your chest or abdomen. Vomit repeatedly. Have trouble breathing. These symptoms may be an emergency. Get help right away. Call 911. Do not wait to see if the symptoms will go away. Do not drive yourself to the hospital. Summary Hypertension is when the force of blood pumping through your arteries is too strong. If this condition is not controlled, it may put you at risk for serious complications. Your personal target blood pressure may vary depending on your medical conditions, your age, and other factors. For most people, a normal blood pressure is less than 120/80. Hypertension is treated with lifestyle changes, medicines, or a combination of both. Lifestyle changes include losing weight, eating a healthy,   low-sodium diet, exercising more, and limiting alcohol. This information is not intended to replace advice given to you by your health care provider. Make sure you discuss any questions you have with your health care provider. Document Revised: 09/21/2021 Document Reviewed: 09/21/2021 Elsevier Patient Education  2023 Elsevier Inc.  

## 2022-12-19 NOTE — Assessment & Plan Note (Signed)
Stable.  Recently evaluated by cardiologist. Office notes, assessment and plan reviewed.

## 2022-12-19 NOTE — Assessment & Plan Note (Signed)
Stable and asymptomatic.  On no medications.
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
## 2022-12-19 NOTE — Assessment & Plan Note (Signed)
Well-controlled hypertension. Continue daily diltiazem 120 mg Cardiovascular risks associated with hypertension discussed. Dietary approaches to stop hypertension discussed.

## 2022-12-20 ENCOUNTER — Other Ambulatory Visit: Payer: Self-pay | Admitting: Emergency Medicine

## 2022-12-20 ENCOUNTER — Telehealth: Payer: Self-pay | Admitting: *Deleted

## 2022-12-20 DIAGNOSIS — R053 Chronic cough: Secondary | ICD-10-CM

## 2022-12-20 MED ORDER — GUAIFENESIN-CODEINE 100-10 MG/5ML PO SYRP: 5.0000 mL | ORAL_SOLUTION | Freq: Three times a day (TID) | ORAL | 1 refills | Status: DC | PRN

## 2022-12-20 NOTE — Telephone Encounter (Signed)
Pharmacy sent fax stating patient cough syrup that was sent to the pharmacy is out of stock,  Please send in alternative

## 2022-12-26 ENCOUNTER — Inpatient Hospital Stay: Payer: PRIVATE HEALTH INSURANCE | Admitting: Internal Medicine

## 2022-12-26 ENCOUNTER — Telehealth: Payer: Self-pay | Admitting: Internal Medicine

## 2022-12-26 ENCOUNTER — Inpatient Hospital Stay: Payer: PRIVATE HEALTH INSURANCE

## 2022-12-26 NOTE — Telephone Encounter (Signed)
Rescheduled appointment per providers request, patient has been called and notified.

## 2022-12-29 ENCOUNTER — Telehealth: Payer: Self-pay | Admitting: Internal Medicine

## 2022-12-29 ENCOUNTER — Encounter: Payer: Self-pay | Admitting: Internal Medicine

## 2022-12-29 NOTE — Telephone Encounter (Signed)
[employer] is calling to make sure there will be an interpreter at appt for patient on 02/02 at 10:30 A.M.

## 2022-12-29 NOTE — Telephone Encounter (Signed)
Attempted to return call, no answer and unable to leave message.  An interpretor has been assigned to patient for appointment tomorrow (12/30/2022).
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
## 2022-12-30 ENCOUNTER — Encounter: Payer: Self-pay | Admitting: Internal Medicine

## 2022-12-30 ENCOUNTER — Encounter: Payer: Self-pay | Admitting: Physician Assistant

## 2022-12-30 ENCOUNTER — Ambulatory Visit: Payer: Medicare Other | Attending: Physician Assistant | Admitting: Physician Assistant

## 2022-12-30 VITALS — BP 122/70 | HR 94 | Ht 59.0 in | Wt 116.0 lb

## 2022-12-30 DIAGNOSIS — I471 Supraventricular tachycardia, unspecified: Secondary | ICD-10-CM | POA: Diagnosis not present

## 2022-12-30 NOTE — Patient Instructions (Signed)
Medication Instructions:   Your physician recommends that you continue on your current medications as directed. Please refer to the Current Medication list given to you today.  *If you need a refill on your cardiac medications before your next appointment, please call your pharmacy*   Lab Work:  [place]    If you have labs (blood work) drawn today and your tests are completely normal, you will receive your results only by: [place] (if you have MyChart) OR A paper copy in the mail If you have any lab test that is abnormal or we need to change your treatment, we will call you to review the results.   Testing/Procedures: NONE ORDERED  TODAY    Follow-Up: At [facility], you and your health needs are our priority.  As part of our continuing mission to provide you with exceptional heart care, we have created designated Provider Care Teams.  These Care Teams include your primary Cardiologist (physician) and Advanced Practice Providers (APPs -  Physician Assistants and Nurse Practitioners) who all work together to provide you with the care you need, when you need it.  We recommend signing up for the patient portal called "MyChart".  Sign up information is provided on this After Visit Summary.  MyChart is used to connect with patients for Virtual Visits (Telemedicine).  Patients are able to view lab/test results, encounter notes, upcoming appointments, etc.  Non-urgent messages can be sent to your provider as well.   To learn more about what you can do with MyChart, go to [email or web address].    Your next appointment:   3-4  month(s)  Provider:   Virl Axe, MD    Other Instructions

## 2022-12-30 NOTE — Telephone Encounter (Signed)
Pt was seen today 12/30/2022 by Tommye Standard, PA-C.

## 2023-01-05 ENCOUNTER — Other Ambulatory Visit: Payer: Self-pay

## 2023-01-05 DIAGNOSIS — C3431 Malignant neoplasm of lower lobe, right bronchus or lung: Secondary | ICD-10-CM

## 2023-01-09 ENCOUNTER — Other Ambulatory Visit: Payer: Self-pay

## 2023-01-09 ENCOUNTER — Inpatient Hospital Stay (HOSPITAL_BASED_OUTPATIENT_CLINIC_OR_DEPARTMENT_OTHER): Payer: Medicare Other | Admitting: Internal Medicine

## 2023-01-09 ENCOUNTER — Inpatient Hospital Stay: Payer: Medicare Other | Attending: Internal Medicine

## 2023-01-09 VITALS — BP 134/87 | HR 92 | Temp 98.6°F | Resp 16 | Wt 122.2 lb

## 2023-01-09 DIAGNOSIS — C3431 Malignant neoplasm of lower lobe, right bronchus or lung: Secondary | ICD-10-CM | POA: Diagnosis not present

## 2023-01-09 DIAGNOSIS — R21 Rash and other nonspecific skin eruption: Secondary | ICD-10-CM | POA: Insufficient documentation

## 2023-01-09 DIAGNOSIS — Z923 Personal history of irradiation: Secondary | ICD-10-CM | POA: Diagnosis not present

## 2023-01-09 DIAGNOSIS — Z79899 Other long term (current) drug therapy: Secondary | ICD-10-CM | POA: Insufficient documentation

## 2023-01-09 DIAGNOSIS — C3490 Malignant neoplasm of unspecified part of unspecified bronchus or lung: Secondary | ICD-10-CM

## 2023-01-09 LAB — CMP (CANCER CENTER ONLY)
ALT: 21 U/L (ref 0–44)
AST: 26 U/L (ref 15–41)
Albumin: 4.2 g/dL (ref 3.5–5.0)
Alkaline Phosphatase: 71 U/L (ref 38–126)
Anion gap: 6 (ref 5–15)
BUN: 9 mg/dL (ref 6–20)
CO2: 31 mmol/L (ref 22–32)
Calcium: 9.2 mg/dL (ref 8.9–10.3)
Chloride: 105 mmol/L (ref 98–111)
Creatinine: 0.46 mg/dL (ref 0.44–1.00)
GFR, Estimated: >60 mL/min (ref >60)
Glucose, Bld: 101 mg/dL — ABNORMAL HIGH (ref 70–99)
Potassium: 4.1 mmol/L (ref 3.5–5.1)
Sodium: 142 mmol/L (ref 135–145)
Total Bilirubin: 0.4 mg/dL (ref 0.3–1.2)
Total Protein: 7.3 g/dL (ref 6.5–8.1)

## 2023-01-09 LAB — CBC WITH DIFFERENTIAL (CANCER CENTER ONLY)
Abs Immature Granulocytes: 0.01 10*3/uL (ref 0.00–0.07)
Basophils Absolute: 0 10*3/uL (ref 0.0–0.1)
Basophils Relative: 1 %
Eosinophils Absolute: 0.1 10*3/uL (ref 0.0–0.5)
Eosinophils Relative: 1 %
HCT: 41.7 % (ref 36.0–46.0)
Hemoglobin: 13.7 g/dL (ref 12.0–15.0)
Immature Granulocytes: 0 %
Lymphocytes Relative: 23 %
Lymphs Abs: 1.8 10*3/uL (ref 0.7–4.0)
MCH: 28 pg (ref 26.0–34.0)
MCHC: 32.9 g/dL (ref 30.0–36.0)
MCV: 85.3 fL (ref 80.0–100.0)
Monocytes Absolute: 0.5 10*3/uL (ref 0.1–1.0)
Monocytes Relative: 7 %
Neutro Abs: 5.2 10*3/uL (ref 1.7–7.7)
Neutrophils Relative %: 68 %
Platelet Count: 242 10*3/uL (ref 150–400)
RBC: 4.89 MIL/uL (ref 3.87–5.11)
RDW: 14.6 % (ref 11.5–15.5)
WBC Count: 7.5 10*3/uL (ref 4.0–10.5)
nRBC: 0 % (ref 0.0–0.2)

## 2023-01-09 MED ORDER — HYDROCOD POLI-CHLORPHE POLI ER 10-8 MG/5ML PO SUER: 5.0000 mL | Freq: Two times a day (BID) | ORAL | 0 refills | Status: DC | PRN

## 2023-01-09 NOTE — Progress Notes (Signed)
#        [facility] [phone]   Fax:(336) [place], [place], [place] [address]  PRINCIPAL DIAGNOSIS: Local recurrence of non-small cell lung cancer, adenocarcinoma, initially diagnosed as stage IB (T2a N0 M0) in March of 2010.   PRIOR THERAPY:  Status post right lower lobectomy under the care of Dr. Cyndia Bent on 04/08/2009. Status post palliative radiotherapy to the right lower lobe recurrent lung mass under the care of Dr. Lisbeth Renshaw, completed on June 08, 2011. Systemic chemotherapy with carboplatin for AUC of 5 and Alimta 500 mg/M2 every 3 weeks. She is status post 3 cycles.  CURRENT THERAPY: Tarceva 150 mg by mouth daily, therapy beginning 07/24/2012. Status post approximately 117 months of therapy.   INTERVAL HISTORY: [name] 62 y.o. female returns to the clinic today for follow-up visit.  She has online interpreter available during the visit.  The patient travel to [place] 2 months ago and she spent 3 weeks there.  She returned back in late December 2023.  The patient had COVID 19 infection in January 2024.  She is feeling much better today.  She denied having any current chest pain, shortness of breath but continues to have the cough with no hemoptysis.  She has been off her cough medication for the last few weeks.  She denied having any weight loss or night sweats.  She has no nausea, vomiting but has occasional diarrhea with no constipation or abdominal pain.  She continues to have mild intermittent skin rash.  The patient is here today for evaluation and repeat blood work.  MEDICAL HISTORY: Past Medical History:  Diagnosis Date   Drug-induced skin rash 02/27/2017   Encounter for antineoplastic chemotherapy 01/19/2016   History of radiation therapy 05/02/11 to 06/08/11   right lung   Lung cancer ([place])    right lower lobe adenocarcinoma    ALLERGIES:  is allergic to codeine and  doxycycline.  MEDICATIONS:  Current Outpatient Medications  Medication Sig Dispense Refill   albuterol (PROAIR HFA) 108 (90 Base) MCG/ACT inhaler Inhale 1-2 puffs into the lungs every 6 (six) hours as needed for wheezing or shortness of breath. 1 Inhaler 2   diltiazem (TIADYLT ER) 120 MG 24 hr capsule Take 1 capsule by mouth daily. 90 capsule 3   erlotinib (TARCEVA) 150 MG tablet Take 1 tablet (150 mg total) by mouth daily. Take on an empty stomach at least 1 hour before or 2 hours after food. 30 tablet 11   guaiFENesin-codeine (ROBITUSSIN AC) 100-10 MG/5ML syrup Take 5 mLs by mouth 3 (three) times daily as needed for cough. 120 mL 1   No current facility-administered medications for this visit.    SURGICAL HISTORY:  Past Surgical History:  Procedure Laterality Date   LUNG LOBECTOMY  04/18/2009   RLL   PORTACATH PLACEMENT  02/29/2012   Procedure: INSERTION PORT-A-CATH;  Surgeon: Nicanor Alcon, MD;  Location: [facility];  Service: Thoracic;  Laterality: Left;  PowerPort 8 F attachable in left internal jugular.    REVIEW OF SYSTEMS:  Constitutional: positive for fatigue Eyes: negative Ears, nose, mouth, throat, and face: negative Respiratory: positive for cough Cardiovascular: negative Gastrointestinal: positive for diarrhea Genitourinary:negative Integument/breast: positive for rash Hematologic/lymphatic: negative Musculoskeletal:negative Neurological: negative Behavioral/Psych: negative Endocrine: negative Allergic/Immunologic: negative   PHYSICAL EXAMINATION: General appearance: alert, cooperative, and no distress Head: Normocephalic, without obvious abnormality, atraumatic Neck: no adenopathy, no JVD, supple, symmetrical, trachea  midline, and thyroid not enlarged, symmetric, no tenderness/mass/nodules Lymph nodes: Cervical, supraclavicular, and axillary nodes normal. Resp: clear to auscultation bilaterally Back: symmetric, no curvature. ROM normal. No CVA tenderness. Cardio:  regular rate and rhythm, S1, S2 normal, no murmur, click, rub or gallop GI: soft, non-tender; bowel sounds normal; no masses,  no organomegaly Extremities: extremities normal, atraumatic, no cyanosis or edema Neurologic: Alert and oriented X 3, normal strength and tone. Normal symmetric reflexes. Normal coordination and gait  ECOG PERFORMANCE STATUS: 1 - Symptomatic but completely ambulatory  Blood pressure 134/87, pulse 92, temperature 98.6 F (37 C), temperature source Oral, resp. rate 16, weight 122 lb 3.2 oz (55.4 kg), last menstrual period 02/10/2012, SpO2 100 %.  LABORATORY DATA: Lab Results  Component Value Date   WBC 7.5 01/09/2023   HGB 13.7 01/09/2023   HCT 41.7 01/09/2023   MCV 85.3 01/09/2023   PLT 242 01/09/2023      Chemistry      Component Value Date/Time   NA 142 01/09/2023 1042   NA 143 10/30/2017 1031   K 4.1 01/09/2023 1042   K 4.3 10/30/2017 1031   CL 105 01/09/2023 1042   CL 102 04/30/2013 1002   CO2 31 01/09/2023 1042   CO2 29 10/30/2017 1031   BUN 9 01/09/2023 1042   BUN 14.6 10/30/2017 1031   CREATININE 0.46 01/09/2023 1042   CREATININE 0.7 10/30/2017 1031      Component Value Date/Time   CALCIUM 9.2 01/09/2023 1042   CALCIUM 9.2 10/30/2017 1031   ALKPHOS 71 01/09/2023 1042   ALKPHOS 103 10/30/2017 1031   AST 26 01/09/2023 1042   AST 20 10/30/2017 1031   ALT 21 01/09/2023 1042   ALT 14 10/30/2017 1031   BILITOT 0.4 01/09/2023 1042   BILITOT 0.79 10/30/2017 1031       RADIOGRAPHIC STUDIES: DG Chest 2 View  Result Date: 12/19/2022 CLINICAL DATA:  Persistent cough, recent COVID infection EXAM: CHEST - 2 VIEW COMPARISON:  12/01/2022 FINDINGS: Unchanged left chest port with tip at superior cavoatrial junction. Unchanged cardiac and mediastinal contours, with aortic tortuosity. Persistent small right pleural effusion. No new focal pulmonary opacity. No pneumothorax. Unchanged cutoff appearance of the right main bronchus with postsurgical  changes. No acute osseous abnormality peer IMPRESSION: 1. Persistent small right pleural effusion. 2. No new cardiopulmonary abnormality. Electronically Signed   By: Merilyn Baba M.D.   On: 12/19/2022 14:33     ASSESSMENT AND PLAN:  This is a very pleasant 62 years old Asian female with recurrent non-small cell lung cancer, adenocarcinoma status post surgical resection followed by systemic chemotherapy with carboplatin and Alimta followed by disease progression and the patient was started on treatment with Tarceva 150 mg by mouth daily status post 117 months  The patient has been tolerating this treatment well except for the mild skin rash and occasional diarrhea. Her lab work today is unremarkable. I recommended for her to continue her current treatment with Tarceva with the same dose. I will see her back for follow-up visit in 2 months for evaluation with repeat CT scan of the chest for restaging of her disease. For the persistent dry cough, I will give her a refill of Tussionex. For the skin rash, she will continue to apply clindamycin lotion on as-needed basis. The patient was advised to call immediately if she has any other concerning symptoms in the interval.  All questions were answered. The patient knows to call the clinic with any problems, questions or  concerns. We can certainly see the patient much sooner if necessary. The total time spent in the appointment was 35 minutes.  Disclaimer: This note was dictated with voice recognition software. Similar sounding words can inadvertently be transcribed and may not be corrected upon review.

## 2023-01-10 ENCOUNTER — Other Ambulatory Visit: Payer: Self-pay

## 2023-01-10 ENCOUNTER — Other Ambulatory Visit: Payer: Self-pay | Admitting: Physician Assistant

## 2023-01-10 ENCOUNTER — Telehealth: Payer: Self-pay

## 2023-01-10 MED ORDER — HYDROCOD POLI-CHLORPHE POLI ER 10-8 MG/5ML PO SUER: 5.0000 mL | Freq: Two times a day (BID) | ORAL | 0 refills | Status: DC | PRN

## 2023-01-10 NOTE — Telephone Encounter (Signed)
Patients son called in needing a refill for her Tussionex, stated patient is out and can't sleep without because she is coughing so bad

## 2023-02-27 ENCOUNTER — Encounter: Payer: Self-pay | Admitting: Internal Medicine

## 2023-02-27 ENCOUNTER — Ambulatory Visit
Admission: RE | Admit: 2023-02-27 | Discharge: 2023-02-27 | Disposition: A | Discharge status: Home / Self Care | Payer: Medicare Other | Source: Ambulatory Visit | Attending: Emergency Medicine | Admitting: Emergency Medicine

## 2023-02-27 DIAGNOSIS — Z1231 Encounter for screening mammogram for malignant neoplasm of breast: Secondary | ICD-10-CM

## 2023-03-06 ENCOUNTER — Other Ambulatory Visit: Payer: Medicare Other

## 2023-03-10 ENCOUNTER — Inpatient Hospital Stay: Payer: Medicare Other | Attending: Internal Medicine

## 2023-03-10 ENCOUNTER — Ambulatory Visit (HOSPITAL_COMMUNITY)
Admission: RE | Admit: 2023-03-10 | Discharge: 2023-03-10 | Disposition: A | Discharge status: Home / Self Care | Payer: Medicare Other | Source: Ambulatory Visit | Attending: Internal Medicine | Admitting: Internal Medicine

## 2023-03-10 ENCOUNTER — Other Ambulatory Visit: Payer: Self-pay

## 2023-03-10 DIAGNOSIS — C3490 Malignant neoplasm of unspecified part of unspecified bronchus or lung: Secondary | ICD-10-CM | POA: Diagnosis not present

## 2023-03-10 DIAGNOSIS — Z79899 Other long term (current) drug therapy: Secondary | ICD-10-CM | POA: Insufficient documentation

## 2023-03-10 DIAGNOSIS — R21 Rash and other nonspecific skin eruption: Secondary | ICD-10-CM | POA: Insufficient documentation

## 2023-03-10 DIAGNOSIS — R918 Other nonspecific abnormal finding of lung field: Secondary | ICD-10-CM | POA: Diagnosis not present

## 2023-03-10 DIAGNOSIS — J90 Pleural effusion, not elsewhere classified: Secondary | ICD-10-CM | POA: Diagnosis not present

## 2023-03-10 DIAGNOSIS — Z923 Personal history of irradiation: Secondary | ICD-10-CM | POA: Insufficient documentation

## 2023-03-10 DIAGNOSIS — C3431 Malignant neoplasm of lower lobe, right bronchus or lung: Secondary | ICD-10-CM | POA: Insufficient documentation

## 2023-03-10 DIAGNOSIS — R059 Cough, unspecified: Secondary | ICD-10-CM | POA: Insufficient documentation

## 2023-03-10 DIAGNOSIS — R197 Diarrhea, unspecified: Secondary | ICD-10-CM | POA: Insufficient documentation

## 2023-03-10 LAB — CBC WITH DIFFERENTIAL (CANCER CENTER ONLY)
Abs Immature Granulocytes: 0.02 10*3/uL (ref 0.00–0.07)
Basophils Absolute: 0 10*3/uL (ref 0.0–0.1)
Basophils Relative: 0 %
Eosinophils Absolute: 0.1 10*3/uL (ref 0.0–0.5)
Eosinophils Relative: 1 %
HCT: 46.2 % — ABNORMAL HIGH (ref 36.0–46.0)
Hemoglobin: 14.8 g/dL (ref 12.0–15.0)
Immature Granulocytes: 0 %
Lymphocytes Relative: 26 %
Lymphs Abs: 2.1 10*3/uL (ref 0.7–4.0)
MCH: 28 pg (ref 26.0–34.0)
MCHC: 32 g/dL (ref 30.0–36.0)
MCV: 87.5 fL (ref 80.0–100.0)
Monocytes Absolute: 0.5 10*3/uL (ref 0.1–1.0)
Monocytes Relative: 7 %
Neutro Abs: 5.2 10*3/uL (ref 1.7–7.7)
Neutrophils Relative %: 66 %
Platelet Count: 276 10*3/uL (ref 150–400)
RBC: 5.28 MIL/uL — ABNORMAL HIGH (ref 3.87–5.11)
RDW: 14.7 % (ref 11.5–15.5)
WBC Count: 7.9 10*3/uL (ref 4.0–10.5)
nRBC: 0 % (ref 0.0–0.2)

## 2023-03-10 LAB — CMP (CANCER CENTER ONLY)
ALT: 12 U/L (ref 0–44)
AST: 20 U/L (ref 15–41)
Albumin: 4.5 g/dL (ref 3.5–5.0)
Alkaline Phosphatase: 73 U/L (ref 38–126)
Anion gap: 5 (ref 5–15)
BUN: 14 mg/dL (ref 8–23)
CO2: 32 mmol/L (ref 22–32)
Calcium: 9.7 mg/dL (ref 8.9–10.3)
Chloride: 102 mmol/L (ref 98–111)
Creatinine: 0.7 mg/dL (ref 0.44–1.00)
GFR, Estimated: >60 mL/min (ref >60)
Glucose, Bld: 102 mg/dL — ABNORMAL HIGH (ref 70–99)
Potassium: 4.5 mmol/L (ref 3.5–5.1)
Sodium: 139 mmol/L (ref 135–145)
Total Bilirubin: 0.8 mg/dL (ref 0.3–1.2)
Total Protein: 8 g/dL (ref 6.5–8.1)

## 2023-03-10 MED ORDER — IOHEXOL 300 MG/ML  SOLN
75.0000 mL | Freq: Once | INTRAMUSCULAR | Status: AC | PRN
  Administered 2023-03-10: 75 mL via INTRAVENOUS

## 2023-03-10 MED ORDER — HEPARIN SOD (PORK) LOCK FLUSH 100 UNIT/ML IV SOLN
500.0000 [IU] | Freq: Once | INTRAVENOUS | Status: AC
  Administered 2023-03-10: 500 [IU] via INTRAVENOUS

## 2023-03-13 ENCOUNTER — Encounter: Payer: Self-pay | Admitting: Internal Medicine

## 2023-03-13 ENCOUNTER — Other Ambulatory Visit (HOSPITAL_COMMUNITY): Payer: Self-pay

## 2023-03-13 ENCOUNTER — Inpatient Hospital Stay (HOSPITAL_BASED_OUTPATIENT_CLINIC_OR_DEPARTMENT_OTHER): Payer: Medicare Other | Admitting: Internal Medicine

## 2023-03-13 VITALS — BP 129/85 | HR 92 | Temp 98.3°F | Resp 15 | Wt 122.1 lb

## 2023-03-13 DIAGNOSIS — R059 Cough, unspecified: Secondary | ICD-10-CM | POA: Diagnosis not present

## 2023-03-13 DIAGNOSIS — C3431 Malignant neoplasm of lower lobe, right bronchus or lung: Secondary | ICD-10-CM | POA: Diagnosis not present

## 2023-03-13 DIAGNOSIS — R197 Diarrhea, unspecified: Secondary | ICD-10-CM | POA: Diagnosis not present

## 2023-03-13 DIAGNOSIS — Z79899 Other long term (current) drug therapy: Secondary | ICD-10-CM | POA: Diagnosis not present

## 2023-03-13 DIAGNOSIS — Z923 Personal history of irradiation: Secondary | ICD-10-CM | POA: Diagnosis not present

## 2023-03-13 DIAGNOSIS — R21 Rash and other nonspecific skin eruption: Secondary | ICD-10-CM | POA: Diagnosis not present

## 2023-03-13 MED ORDER — HYDROCOD POLI-CHLORPHE POLI ER 10-8 MG/5ML PO SUER
5.0000 mL | Freq: Two times a day (BID) | ORAL | 0 refills | Status: DC | PRN
  Filled 2023-03-13: qty 473, 47d supply, fill #0

## 2023-03-13 NOTE — Progress Notes (Signed)
#        [facility] Telephone:(336) [phone]   Fax:(336) [phone]  OFFICE PROGRESS NOTE  [name], MD [address] [place] [address]  PRINCIPAL DIAGNOSIS: Local recurrence of non-small cell lung cancer, adenocarcinoma, initially diagnosed as stage IB (T2a N0 M0) in March of 2010.   PRIOR THERAPY:  Status post right lower lobectomy under the care of Dr. Laneta Simmers on 04/08/2009. Status post palliative radiotherapy to the right lower lobe recurrent lung mass under the care of Dr. Mitzi Hansen, completed on June 08, 2011. Systemic chemotherapy with carboplatin for AUC of 5 and Alimta 500 mg/M2 every 3 weeks. She is status post 3 cycles.  CURRENT THERAPY: Tarceva 150 mg by mouth daily, therapy beginning 07/24/2012. Status post approximately 119 months of therapy.   INTERVAL HISTORY: [name] 62 y.o. female returns to the clinic today for follow-up visit accompanied by her [place] interpreter.  The patient is feeling fine today with no concerning complaints except for the dry cough and occasional diarrhea with mild skin rash.  She denied having any fever or chills.  She has no chest pain, shortness of breath or hemoptysis.  She has no nausea, vomiting, abdominal pain or constipation.  She denied having any significant weight loss or night sweats.  She has no headache or visual changes.  She has been tolerating her treatment with Tarceva fairly well.  She is here today for evaluation and repeat CT scan of the chest for restaging of her disease.  MEDICAL HISTORY: Past Medical History:  Diagnosis Date   Drug-induced skin rash 02/27/2017   Encounter for antineoplastic chemotherapy 01/19/2016   History of radiation therapy 05/02/11 to 06/08/11   right lung   Lung cancer ([facility])    right lower lobe adenocarcinoma    ALLERGIES:  is allergic to codeine and doxycycline.  MEDICATIONS:  Current Outpatient Medications  Medication Sig Dispense Refill   albuterol (PROAIR HFA)  108 (90 Base) MCG/ACT inhaler Inhale 1-2 puffs into the lungs every 6 (six) hours as needed for wheezing or shortness of breath. 1 Inhaler 2   chlorpheniramine-HYDROcodone (TUSSIONEX) 10-8 MG/5ML Take 5 mLs by mouth every 12 (twelve) hours as needed for cough. 473 mL 0   diltiazem (TIADYLT ER) 120 MG 24 hr capsule Take 1 capsule by mouth daily. 90 capsule 3   erlotinib (TARCEVA) 150 MG tablet Take 1 tablet (150 mg total) by mouth daily. Take on an empty stomach at least 1 hour before or 2 hours after food. 30 tablet 11   No current facility-administered medications for this visit.    SURGICAL HISTORY:  Past Surgical History:  Procedure Laterality Date   LUNG LOBECTOMY  04/18/2009   RLL   PORTACATH PLACEMENT  02/29/2012   Procedure: INSERTION PORT-A-CATH;  Surgeon: Ines Bloomer, MD;  Location: [facility];  Service: Thoracic;  Laterality: Left;  PowerPort 8 F attachable in left internal jugular.    REVIEW OF SYSTEMS:  Constitutional: negative Eyes: negative Ears, nose, mouth, throat, and face: negative Respiratory: positive for cough Cardiovascular: negative Gastrointestinal: positive for diarrhea Genitourinary:negative Integument/breast: positive for rash Hematologic/lymphatic: negative Musculoskeletal:negative Neurological: negative Behavioral/Psych: negative Endocrine: negative Allergic/Immunologic: negative   PHYSICAL EXAMINATION: General appearance: alert, cooperative, and no distress Head: Normocephalic, without obvious abnormality, atraumatic Neck: no adenopathy, no JVD, supple, symmetrical, trachea midline, and thyroid not enlarged, symmetric, no tenderness/mass/nodules Lymph nodes: Cervical, supraclavicular, and axillary nodes normal. Resp: clear to auscultation bilaterally Back: symmetric, no curvature. ROM normal.  No CVA tenderness. Cardio: regular rate and rhythm, S1, S2 normal, no murmur, click, rub or gallop GI: soft, non-tender; bowel sounds normal; no masses,  no  organomegaly Extremities: extremities normal, atraumatic, no cyanosis or edema Neurologic: Alert and oriented X 3, normal strength and tone. Normal symmetric reflexes. Normal coordination and gait  ECOG PERFORMANCE STATUS: 1 - Symptomatic but completely ambulatory  Blood pressure 129/85, pulse 92, temperature 98.3 F (36.8 C), temperature source Oral, resp. rate 15, weight 122 lb 1.6 oz (55.4 kg), last menstrual period 02/10/2012, SpO2 99 %.  LABORATORY DATA: Lab Results  Component Value Date   WBC 7.9 03/10/2023   HGB 14.8 03/10/2023   HCT 46.2 (H) 03/10/2023   MCV 87.5 03/10/2023   PLT 276 03/10/2023      Chemistry      Component Value Date/Time   NA 139 03/10/2023 0928   NA 143 10/30/2017 1031   K 4.5 03/10/2023 0928   K 4.3 10/30/2017 1031   CL 102 03/10/2023 0928   CL 102 04/30/2013 1002   CO2 32 03/10/2023 0928   CO2 29 10/30/2017 1031   BUN 14 03/10/2023 0928   BUN 14.6 10/30/2017 1031   CREATININE 0.70 03/10/2023 0928   CREATININE 0.7 10/30/2017 1031      Component Value Date/Time   CALCIUM 9.7 03/10/2023 0928   CALCIUM 9.2 10/30/2017 1031   ALKPHOS 73 03/10/2023 0928   ALKPHOS 103 10/30/2017 1031   AST 20 03/10/2023 0928   AST 20 10/30/2017 1031   ALT 12 03/10/2023 0928   ALT 14 10/30/2017 1031   BILITOT 0.8 03/10/2023 0928   BILITOT 0.79 10/30/2017 1031       RADIOGRAPHIC STUDIES: MM 3D SCREEN BREAST BILATERAL  Result Date: 02/28/2023 CLINICAL DATA:  Screening. EXAM: DIGITAL SCREENING BILATERAL MAMMOGRAM WITH TOMOSYNTHESIS AND CAD TECHNIQUE: Bilateral screening digital craniocaudal and mediolateral oblique mammograms were obtained. Bilateral screening digital breast tomosynthesis was performed. The images were evaluated with computer-aided detection. COMPARISON:  Previous exam(s). ACR Breast Density Category c: The breasts are heterogeneously dense, which may obscure small masses. FINDINGS: There are no findings suspicious for malignancy. IMPRESSION: No  mammographic evidence of malignancy. A result letter of this screening mammogram will be mailed directly to the patient. RECOMMENDATION: Screening mammogram in one year. (Code:SM-B-01Y) BI-RADS CATEGORY  1: Negative. Electronically Signed   By: Sherian Rein M.D.   On: 02/28/2023 11:33     ASSESSMENT AND PLAN:  This is a very pleasant 62 years old Asian female with recurrent non-small cell lung cancer, adenocarcinoma status post surgical resection followed by systemic chemotherapy with carboplatin and Alimta followed by disease progression and the patient was started on treatment with Tarceva 150 mg by mouth daily status post 119 months  The patient has been tolerating this treatment well with no concerning adverse effects. She had repeat CT scan of the chest performed few days ago but the final report is still pending.  I personally and independently reviewed the scan images and compared them to the previous imaging studies.  I did not see any concerning findings for progression but I will wait for the final report for confirmation. I recommended for the patient to continue her current treatment with Tagrisso with the same dose. I will see her back for follow-up visit in 2 months for evaluation and repeat blood work. For the dry cough, I will give her a refill of Tussionex and send it to [facility] outpatient pharmacy since [facility] did not have  the medication. For the diarrhea she will continue on Imodium on as-needed basis. For the skin rash she applies clindamycin lotion on as-needed basis. The patient was advised to call immediately if she has any other concerning symptoms in the interval.  All questions were answered. The patient knows to call the clinic with any problems, questions or concerns. We can certainly see the patient much sooner if necessary. The total time spent in the appointment was 30 minutes.  Disclaimer: This note was dictated with voice recognition software. Similar sounding words  can inadvertently be transcribed and may not be corrected upon review.

## 2023-03-14 ENCOUNTER — Other Ambulatory Visit: Payer: Self-pay | Admitting: Internal Medicine

## 2023-03-14 DIAGNOSIS — C3490 Malignant neoplasm of unspecified part of unspecified bronchus or lung: Secondary | ICD-10-CM

## 2023-03-21 ENCOUNTER — Ambulatory Visit: Payer: Medicare Other | Attending: Internal Medicine | Admitting: Internal Medicine

## 2023-03-21 ENCOUNTER — Telehealth: Payer: Self-pay | Admitting: Internal Medicine

## 2023-03-21 ENCOUNTER — Encounter: Payer: Self-pay | Admitting: Internal Medicine

## 2023-03-21 VITALS — BP 126/84 | HR 72 | Ht 59.0 in | Wt 119.4 lb

## 2023-03-21 DIAGNOSIS — I471 Supraventricular tachycardia, unspecified: Secondary | ICD-10-CM | POA: Diagnosis not present

## 2023-03-21 NOTE — Progress Notes (Signed)
Patient Care Team: [name], MD as PCP - General (Internal Medicine) Duke Salvia, MD as PCP - Electrophysiology (Cardiology)   HPI  [name] is a 62 y.o. female last seen by me 2018 but by the EP apps in the interim for SVT thought for to be multifocal atrial tachycardia with history of  NSCLC, adenocarcinoma (s/p RLL lobectomy surgery in 2010, and radiation therapy to recurrent lung mass completed 2012 and chemo completed 2012, maintained on Tarceva started 2013 to present), chronic dry cough, SVT identified as an atrial tachycardia  Event recorder 11/23 demonstrated multiple episodes of atrial tachycardia with rates up to 210 bpm antiarrhythmic therapy was recommended but she remains on diltiazem  Recurrent palpitations.  I discussed as needed diltiazem in the past.  More recently intercurrent COVID and when seen by our [facility] 2/24 denied dizziness shortness of breath or palpitations   The patient denies chest pain, shortness of breath, nocturnal dyspnea, orthopnea or peripheral edema.  There have been no palpitations of late , lightheadedness or syncope.      DATE TEST EF   6/18 Echo  60-65 %         Date Cr K Hgb  8/22 0.72 4.5 12.9  10/22 0.64 3.5 14.0  4/24 0.7 4.5 14.8    Past Medical History:  Diagnosis Date   Drug-induced skin rash 02/27/2017   Encounter for antineoplastic chemotherapy 01/19/2016   History of radiation therapy 05/02/11 to 06/08/11   right lung   Lung cancer    right lower lobe adenocarcinoma    Past Surgical History:  Procedure Laterality Date   LUNG LOBECTOMY  04/18/2009   RLL   PORTACATH PLACEMENT  02/29/2012   Procedure: INSERTION PORT-A-CATH;  Surgeon: Ines Bloomer, MD;  Location: [facility];  Service: Thoracic;  Laterality: Left;  PowerPort 8 F attachable in left internal jugular.    Current Meds  Medication Sig   albuterol (PROAIR HFA) 108 (90 Base) MCG/ACT inhaler Inhale 1-2 puffs into the lungs every 6 (six) hours as needed  for wheezing or shortness of breath.   chlorpheniramine-HYDROcodone (TUSSIONEX) 10-8 MG/5ML Take 5 mLs by mouth every 12 hours as needed for cough.   diltiazem (TIADYLT ER) 120 MG 24 hr capsule Take 1 capsule by mouth daily.   erlotinib (TARCEVA) 150 MG tablet Take 1 tablet (150 mg total) by mouth daily. Take on an empty stomach at least 1 hour before or 2 hours after food.    Allergies  Allergen Reactions   Codeine Nausea Only   Doxycycline Nausea And Vomiting    vomiting and headache      Review of Systems negative except from HPI and PMH  Physical Exam BP 126/84   Pulse 72   Ht  (1.499 m)   Wt 119 lb 6.4 oz (54.2 kg)   LMP 02/10/2012   SpO2 98%   BMI 24.12 kg/m  Well developed and nourished in no acute distress HENT normal Neck supple with JVP-  flat   Clear Regular rate and rhythm, no murmurs or gallops Abd-soft with active BS No Clubbing cyanosis edema Skin-warm and dry A & Oriented  Grossly normal sensory and motor function  ECG sinus at 72 Interval 14/09/41 Recurrent sinus pauses  ECG Ectopic rhythm  at 95 with inverted P waves in lead II Intervals 16/08/38 Estimated Creatinine Clearance: 55.5 mL/min (by C-G formula based on SCr of 0.7 mg/dL).   Assessment and  Plan  SVT  ectopic atrial tachycardia  Sinus node  dysfunction  Chest pain  atypical  Non-small cell lung cancer status post right lower lobe lobectomy 2010 with radiation therapy 2012 on immunotherapy  Sinus pauses noted frequently.  Has been seen on the monitor.  Not appear to be posttermination.  Suspect that this is sinus given the P wave morphology compared to 1/23 ECG  Palpitations are intermittent.  Will leave the medications the way they are, either uptitration of a calcium blocker or the addition of a 1C might aggravate her sinus node dysfunction which at this point is not causing her any problems

## 2023-03-21 NOTE — Telephone Encounter (Signed)
Called patient's daughter regarding upcoming May appointments, patient will be notified.

## 2023-03-21 NOTE — Patient Instructions (Signed)
Medication Instructions:  Your physician recommends that you continue on your current medications as directed. Please refer to the Current Medication list given to you today.  *If you need a refill on your cardiac medications before your next appointment, please call your pharmacy*   Lab Work: None ordered.  If you have labs (blood work) drawn today and your tests are completely normal, you will receive your results only by: MyChart Message (if you have MyChart) OR A paper copy in the mail If you have any lab test that is abnormal or we need to change your treatment, we will call you to review the results.   Testing/Procedures: None ordered.    Follow-Up: At [facility], you and your health needs are our priority.  As part of our continuing mission to provide you with exceptional heart care, we have created designated Provider Care Teams.  These Care Teams include your primary Cardiologist (physician) and Advanced Practice Providers (APPs -  Physician Assistants and Nurse Practitioners) who all work together to provide you with the care you need, when you need it.  We recommend signing up for the patient portal called "MyChart".  Sign up information is provided on this After Visit Summary.  MyChart is used to connect with patients for Virtual Visits (Telemedicine).  Patients are able to view lab/test results, encounter notes, upcoming appointments, etc.  Non-urgent messages can be sent to your provider as well.   To learn more about what you can do with MyChart, go to [email or web address].    Your next appointment:   6 months with Francis Dowse, PA-C  12 months with Dr Graciela Husbands

## 2023-03-28 ENCOUNTER — Other Ambulatory Visit (HOSPITAL_COMMUNITY): Payer: Self-pay

## 2023-03-31 ENCOUNTER — Encounter (HOSPITAL_COMMUNITY)
Admission: RE | Admit: 2023-03-31 | Discharge: 2023-03-31 | Disposition: A | Discharge status: Home / Self Care | Payer: Medicare Other | Source: Ambulatory Visit | Attending: Internal Medicine | Admitting: Internal Medicine

## 2023-03-31 ENCOUNTER — Inpatient Hospital Stay: Payer: Medicare Other | Attending: Internal Medicine

## 2023-03-31 DIAGNOSIS — C3490 Malignant neoplasm of unspecified part of unspecified bronchus or lung: Secondary | ICD-10-CM | POA: Diagnosis not present

## 2023-03-31 DIAGNOSIS — C3431 Malignant neoplasm of lower lobe, right bronchus or lung: Secondary | ICD-10-CM | POA: Diagnosis not present

## 2023-03-31 DIAGNOSIS — R059 Cough, unspecified: Secondary | ICD-10-CM | POA: Diagnosis not present

## 2023-03-31 DIAGNOSIS — Z95828 Presence of other vascular implants and grafts: Secondary | ICD-10-CM

## 2023-03-31 DIAGNOSIS — C3411 Malignant neoplasm of upper lobe, right bronchus or lung: Secondary | ICD-10-CM | POA: Diagnosis not present

## 2023-03-31 DIAGNOSIS — R197 Diarrhea, unspecified: Secondary | ICD-10-CM | POA: Diagnosis not present

## 2023-03-31 DIAGNOSIS — R21 Rash and other nonspecific skin eruption: Secondary | ICD-10-CM | POA: Diagnosis not present

## 2023-03-31 LAB — COMPREHENSIVE METABOLIC PANEL
ALT: 16 U/L (ref 0–44)
AST: 21 U/L (ref 15–41)
Albumin: 4.4 g/dL (ref 3.5–5.0)
Alkaline Phosphatase: 70 U/L (ref 38–126)
Anion gap: 5 (ref 5–15)
BUN: 11 mg/dL (ref 8–23)
CO2: 30 mmol/L (ref 22–32)
Calcium: 8.8 mg/dL — ABNORMAL LOW (ref 8.9–10.3)
Chloride: 105 mmol/L (ref 98–111)
Creatinine, Ser: 0.56 mg/dL (ref 0.44–1.00)
GFR, Estimated: >60 mL/min (ref >60)
Glucose, Bld: 117 mg/dL — ABNORMAL HIGH (ref 70–99)
Potassium: 4 mmol/L (ref 3.5–5.1)
Sodium: 140 mmol/L (ref 135–145)
Total Bilirubin: 1 mg/dL (ref 0.3–1.2)
Total Protein: 7.4 g/dL (ref 6.5–8.1)

## 2023-03-31 LAB — CBC WITH DIFFERENTIAL/PLATELET
Abs Immature Granulocytes: 0.02 10*3/uL (ref 0.00–0.07)
Basophils Absolute: 0 10*3/uL (ref 0.0–0.1)
Basophils Relative: 0 %
Eosinophils Absolute: 0.1 10*3/uL (ref 0.0–0.5)
Eosinophils Relative: 1 %
HCT: 42.8 % (ref 36.0–46.0)
Hemoglobin: 14.1 g/dL (ref 12.0–15.0)
Immature Granulocytes: 0 %
Lymphocytes Relative: 25 %
Lymphs Abs: 1.8 10*3/uL (ref 0.7–4.0)
MCH: 28.9 pg (ref 26.0–34.0)
MCHC: 32.9 g/dL (ref 30.0–36.0)
MCV: 87.7 fL (ref 80.0–100.0)
Monocytes Absolute: 0.6 10*3/uL (ref 0.1–1.0)
Monocytes Relative: 8 %
Neutro Abs: 4.8 10*3/uL (ref 1.7–7.7)
Neutrophils Relative %: 66 %
Platelets: 273 10*3/uL (ref 150–400)
RBC: 4.88 MIL/uL (ref 3.87–5.11)
RDW: 14.6 % (ref 11.5–15.5)
WBC: 7.4 10*3/uL (ref 4.0–10.5)
nRBC: 0 % (ref 0.0–0.2)

## 2023-03-31 LAB — GLUCOSE, CAPILLARY: Glucose-Capillary: 114 mg/dL — ABNORMAL HIGH (ref 70–99)

## 2023-03-31 MED ORDER — HEPARIN SOD (PORK) LOCK FLUSH 100 UNIT/ML IV SOLN
500.0000 [IU] | Freq: Once | INTRAVENOUS | Status: AC | PRN
  Administered 2023-03-31: 500 [IU] via INTRAVENOUS

## 2023-03-31 MED ORDER — SODIUM CHLORIDE 0.9% FLUSH
10.0000 mL | INTRAVENOUS | Status: DC | PRN
  Administered 2023-03-31: 10 mL via INTRAVENOUS

## 2023-03-31 MED ORDER — FLUDEOXYGLUCOSE F - 18 (FDG) INJECTION
7.0000 | Freq: Once | INTRAVENOUS | Status: AC
  Administered 2023-03-31: 6.4 via INTRAVENOUS

## 2023-04-04 ENCOUNTER — Inpatient Hospital Stay (HOSPITAL_BASED_OUTPATIENT_CLINIC_OR_DEPARTMENT_OTHER): Payer: Medicare Other | Admitting: Internal Medicine

## 2023-04-04 ENCOUNTER — Other Ambulatory Visit: Payer: Self-pay

## 2023-04-04 VITALS — BP 155/99 | HR 99 | Temp 98.2°F | Resp 17 | Wt 123.0 lb

## 2023-04-04 DIAGNOSIS — R059 Cough, unspecified: Secondary | ICD-10-CM | POA: Insufficient documentation

## 2023-04-04 DIAGNOSIS — R197 Diarrhea, unspecified: Secondary | ICD-10-CM | POA: Insufficient documentation

## 2023-04-04 DIAGNOSIS — R21 Rash and other nonspecific skin eruption: Secondary | ICD-10-CM | POA: Diagnosis not present

## 2023-04-04 DIAGNOSIS — C3431 Malignant neoplasm of lower lobe, right bronchus or lung: Secondary | ICD-10-CM | POA: Diagnosis not present

## 2023-04-04 NOTE — Progress Notes (Signed)
#        [facility] Telephone:(336) [phone]   Fax:(336) [phone]  OFFICE PROGRESS NOTE  [name], MD [address] [place] [address]  PRINCIPAL DIAGNOSIS: Local recurrence of non-small cell lung cancer, adenocarcinoma, initially diagnosed as stage IB (T2a N0 M0) in March of 2010.   PRIOR THERAPY:  Status post right lower lobectomy under the care of Dr. Laneta Simmers on 04/08/2009. Status post palliative radiotherapy to the right lower lobe recurrent lung mass under the care of Dr. Mitzi Hansen, completed on June 08, 2011. Systemic chemotherapy with carboplatin for AUC of 5 and Alimta 500 mg/M2 every 3 weeks. She is status post 3 cycles.  CURRENT THERAPY: Tarceva 150 mg by mouth daily, therapy beginning 07/24/2012. Status post approximately 120 months of therapy.   INTERVAL HISTORY: [name] 62 y.o. female returns to the clinic today for follow-up visit accompanied by her interpreter.  The patient is feeling fine with no concerning complaints but was very anxious about her PET scan results.  She has no current chest pain, shortness of breath but has dry cough with no hemoptysis.  She has no nausea, vomiting but occasional diarrhea with no hemoptysis.  She continues to have intermittent skin rash.  She has no fever or chills.  She denied having any recent weight loss or night sweats.  She has been tolerating her treatment with Tarceva fairly well.  The patient was found on previous CT scan of the chest to have increased consolidation and groundglass opacity adjacent to the posttreatment area concerning for local recurrence.  A PET scan was recommended by radiology which was done recently and the patient is here for evaluation and discussion of her PET scan results.  MEDICAL HISTORY: Past Medical History:  Diagnosis Date   Drug-induced skin rash 02/27/2017   Encounter for antineoplastic chemotherapy 01/19/2016   History of radiation therapy 05/02/11 to 06/08/11   right lung    Lung cancer (HCC)    right lower lobe adenocarcinoma    ALLERGIES:  is allergic to codeine and doxycycline.  MEDICATIONS:  Current Outpatient Medications  Medication Sig Dispense Refill   albuterol (PROAIR HFA) 108 (90 Base) MCG/ACT inhaler Inhale 1-2 puffs into the lungs every 6 (six) hours as needed for wheezing or shortness of breath. 1 Inhaler 2   chlorpheniramine-HYDROcodone (TUSSIONEX) 10-8 MG/5ML Take 5 mLs by mouth every 12 hours as needed for cough. 473 mL 0   diltiazem (TIADYLT ER) 120 MG 24 hr capsule Take 1 capsule by mouth daily. 90 capsule 3   erlotinib (TARCEVA) 150 MG tablet Take 1 tablet (150 mg total) by mouth daily. Take on an empty stomach at least 1 hour before or 2 hours after food. 30 tablet 11   No current facility-administered medications for this visit.    SURGICAL HISTORY:  Past Surgical History:  Procedure Laterality Date   LUNG LOBECTOMY  04/18/2009   RLL   PORTACATH PLACEMENT  02/29/2012   Procedure: INSERTION PORT-A-CATH;  Surgeon: Ines Bloomer, MD;  Location: [facility] OR;  Service: Thoracic;  Laterality: Left;  PowerPort 8 F attachable in left internal jugular.    REVIEW OF SYSTEMS:  Constitutional: negative Eyes: negative Ears, nose, mouth, throat, and face: negative Respiratory: positive for cough Cardiovascular: negative Gastrointestinal: positive for diarrhea Genitourinary:negative Integument/breast: positive for rash Hematologic/lymphatic: negative Musculoskeletal:negative Neurological: negative Behavioral/Psych: negative Endocrine: negative Allergic/Immunologic: negative   PHYSICAL EXAMINATION: General appearance: alert, cooperative, and no distress Head: Normocephalic, without obvious abnormality,  atraumatic Neck: no adenopathy, no JVD, supple, symmetrical, trachea midline, and thyroid not enlarged, symmetric, no tenderness/mass/nodules Lymph nodes: Cervical, supraclavicular, and axillary nodes normal. Resp: clear to auscultation  bilaterally Back: symmetric, no curvature. ROM normal. No CVA tenderness. Cardio: regular rate and rhythm, S1, S2 normal, no murmur, click, rub or gallop GI: soft, non-tender; bowel sounds normal; no masses,  no organomegaly Extremities: extremities normal, atraumatic, no cyanosis or edema Neurologic: Alert and oriented X 3, normal strength and tone. Normal symmetric reflexes. Normal coordination and gait  ECOG PERFORMANCE STATUS: 1 - Symptomatic but completely ambulatory  Blood pressure (!) 155/99, pulse 99, temperature 98.2 F (36.8 C), temperature source Oral, resp. rate 17, weight 123 lb (55.8 kg), last menstrual period 02/10/2012, SpO2 99 %.  LABORATORY DATA: Lab Results  Component Value Date   WBC 7.4 03/31/2023   HGB 14.1 03/31/2023   HCT 42.8 03/31/2023   MCV 87.7 03/31/2023   PLT 273 03/31/2023      Chemistry      Component Value Date/Time   NA 140 03/31/2023 0930   NA 143 10/30/2017 1031   K 4.0 03/31/2023 0930   K 4.3 10/30/2017 1031   CL 105 03/31/2023 0930   CL 102 04/30/2013 1002   CO2 30 03/31/2023 0930   CO2 29 10/30/2017 1031   BUN 11 03/31/2023 0930   BUN 14.6 10/30/2017 1031   CREATININE 0.56 03/31/2023 0930   CREATININE 0.70 03/10/2023 0928   CREATININE 0.7 10/30/2017 1031      Component Value Date/Time   CALCIUM 8.8 (L) 03/31/2023 0930   CALCIUM 9.2 10/30/2017 1031   ALKPHOS 70 03/31/2023 0930   ALKPHOS 103 10/30/2017 1031   AST 21 03/31/2023 0930   AST 20 03/10/2023 0928   AST 20 10/30/2017 1031   ALT 16 03/31/2023 0930   ALT 12 03/10/2023 0928   ALT 14 10/30/2017 1031   BILITOT 1.0 03/31/2023 0930   BILITOT 0.8 03/10/2023 0928   BILITOT 0.79 10/30/2017 1031       RADIOGRAPHIC STUDIES: NM PET Image Restage (PS) Skull Base to Thigh (F-18 FDG)  Result Date: 04/04/2023 CLINICAL DATA:  Subsequent treatment strategy for non-small cell lung cancer. Concern for recurrence or synchronous bronchogenic carcinoma in the RIGHT upper lobe with  suspicious part solid nodule. EXAM: NUCLEAR MEDICINE PET SKULL BASE TO THIGH TECHNIQUE: 6.4 mCi F-18 FDG was injected intravenously. Full-ring PET imaging was performed from the skull base to thigh after the radiotracer. CT data was obtained and used for attenuation correction and anatomic localization. Fasting blood glucose: 114 mg/dl COMPARISON:  None Available. FINDINGS: Mediastinal blood pool activity: SUV max 2.0 Liver activity: SUV max NA NECK: Intense metabolic activity associated with the RIGHT lobe of the thyroid gland. Discrete nodule noted on comparison contrast exam measuring 18 mm. SUV max equal 9.2 No hypermetabolic lymph nodes in the neck. Incidental CT findings: None. CHEST: Nodule of concern in the RIGHT upper lobe measures 9 mm (image 32/series 7) and does not have associated metabolic activity. Dense post surgical and radiation change in the posterior aspect of the RIGHT upper lobe without minimal metabolic activity. One small focus of activity within the consolidation with SUV max equal 3.3. Favor benign post surgical and radiation change. No hypermetabolic mediastinal lymph nodes. Incidental CT findings: Port in the anterior chest wall with tip in distal SVC. Small pleural effusion. ABDOMEN/PELVIS: No abnormal hypermetabolic activity within the liver, pancreas, adrenal glands, or spleen. No hypermetabolic lymph nodes in the abdomen  or pelvis. Incidental CT findings: None. SKELETON: No focal hypermetabolic activity to suggest skeletal metastasis. Incidental CT findings: None. IMPRESSION: 1. No metabolic activity associated with RIGHT upper lobe pulmonary nodule of concern. Findings not typical of bronchogenic carcinoma. However cannot exclude a low-grade adenocarcinoma. Recommend attention on follow-up versus tissue sampling. 2. Minimal metabolic activity within the posterior RIGHT upper lobe consolidation is favored post radiation and postsurgical change. 3. No evidence of mediastinal metastatic  nodal disease or distant metastatic disease. 4. Intense metabolic activity associated with a nodule within the RIGHT lobe of thyroid gland. Recommend ultrasound and potential tissue sampling. Electronically Signed   By: Genevive Bi M.D.   On: 04/04/2023 09:35   CT Chest W Contrast  Result Date: 03/13/2023 CLINICAL DATA:  Non-small cell lung cancer; * Tracking Code: BO * EXAM: CT CHEST WITH CONTRAST TECHNIQUE: Multidetector CT imaging of the chest was performed during intravenous contrast administration. RADIATION DOSE REDUCTION: This exam was performed according to the departmental dose-optimization program which includes automated exposure control, adjustment of the mA and/or kV according to patient size and/or use of iterative reconstruction technique. CONTRAST:  75mL OMNIPAQUE IOHEXOL 300 MG/ML  SOLN COMPARISON:  Multiple priors, most recent chest CT dated September 19, 2022 FINDINGS: Cardiovascular: Normal heart size. Trace pericardial effusion. Normal caliber thoracic aorta with mild atherosclerotic disease. Mediastinum/Nodes: Esophagus is unremarkable. Right thyroid nodule measuring 1.8 cm, unchanged when compared with the prior exam. No enlarged chest. Lungs/Pleura: Prior right lower lobectomy. Right perihilar postradiation change with increased consolidations and ground-glass opacities adjacent to the posttreatment area, best appreciated on sagittal series 7, image 45. Right upper lobe part solid nodule with small amount of peripheral ground-glass measuring 10 x 6 mm on series 5, image 60, previously measured 3 mm on prior exam. Small right pleural effusion, unchanged when compared with the prior exam. Upper Abdomen: No acute abnormality. Musculoskeletal: Unchanged osseous irregularity of the posterior right ribs, likely postradiation change. No new osseous lesions. IMPRESSION: 1. Right perihilar postradiation change with increased consolidations and ground-glass opacities adjacent to the  posttreatment area, findings are concerning for local recurrence. Recommend PET-CT for further evaluation. 2. Right upper lobe part solid nodule is increased in size, concerning for metastatic disease or synchronous primary. 3. Stable small right pleural effusion. 4. Aortic Atherosclerosis (ICD10-I70.0). Electronically Signed   By: Allegra Lai M.D.   On: 03/13/2023 19:02     ASSESSMENT AND PLAN:  This is a very pleasant 62 years old Asian female with recurrent non-small cell lung cancer, adenocarcinoma status post surgical resection followed by systemic chemotherapy with carboplatin and Alimta followed by disease progression and the patient was started on treatment with Tarceva 150 mg by mouth daily status post 120 months  She has been tolerating this treatment well with no concerning adverse effects. Her previous CT scan of the chest showed suspicious disease recurrence.  I ordered a PET scan which was performed recently and showed no evidence for metabolic activity associated with the right upper lobe pulmonary nodule or evidence for disease recurrence locally and there was some post radiation changes. I discussed the scan results with the patient and recommended for her to continue her current treatment with Tarceva with the same dose. I will see her back for follow-up visit in 2 months for evaluation and repeat blood work. For the hypermetabolic thyroid nodule, we will continue to monitor it closely on upcoming imaging studies.  She has been going through a lot of radiology studies recently and I  will hold on the ultrasound for now. For the dry cough, I will give her a refill of Tussionex and send it to [facility] outpatient pharmacy since [facility] did not have the medication. For the diarrhea she will continue on Imodium on as-needed basis. For the skin rash she applies clindamycin lotion on as-needed basis. The patient was advised to call immediately if she has any concerning symptoms in the  interval. All questions were answered. The patient knows to call the clinic with any problems, questions or concerns. We can certainly see the patient much sooner if necessary. The total time spent in the appointment was 30 minutes.  Disclaimer: This note was dictated with voice recognition software. Similar sounding words can inadvertently be transcribed and may not be corrected upon review.

## 2023-04-17 ENCOUNTER — Ambulatory Visit: Payer: Medicare Other | Admitting: Internal Medicine

## 2023-05-15 ENCOUNTER — Other Ambulatory Visit: Payer: Medicare Other

## 2023-05-15 ENCOUNTER — Ambulatory Visit: Payer: Medicare Other | Admitting: Internal Medicine

## 2023-05-24 ENCOUNTER — Telehealth: Payer: Self-pay | Admitting: Pharmacy Technician

## 2023-05-24 ENCOUNTER — Encounter: Payer: Self-pay | Admitting: Internal Medicine

## 2023-05-24 ENCOUNTER — Other Ambulatory Visit: Payer: Self-pay | Admitting: Medical Oncology

## 2023-05-24 ENCOUNTER — Other Ambulatory Visit (HOSPITAL_COMMUNITY): Payer: Self-pay

## 2023-05-24 DIAGNOSIS — C3431 Malignant neoplasm of lower lobe, right bronchus or lung: Secondary | ICD-10-CM

## 2023-05-24 DIAGNOSIS — C3491 Malignant neoplasm of unspecified part of right bronchus or lung: Secondary | ICD-10-CM

## 2023-05-24 DIAGNOSIS — Z95828 Presence of other vascular implants and grafts: Secondary | ICD-10-CM

## 2023-05-24 MED ORDER — ERLOTINIB HCL 150 MG PO TABS
150.0000 mg | ORAL_TABLET | Freq: Every day | ORAL | 11 refills | Status: DC
Start: 2023-05-24 — End: 2023-05-24
  Filled 2023-05-24: qty 30, 30d supply, fill #0

## 2023-05-24 MED ORDER — ERLOTINIB HCL 150 MG PO TABS: 150.0000 mg | ORAL_TABLET | Freq: Every day | ORAL | 11 refills | Status: DC

## 2023-05-24 NOTE — Telephone Encounter (Signed)
Oral Oncology Patient Advocate Encounter   Received notification that prior authorization for Erlotinib is required.   PA submitted on 05/24/23 Key [id number] Status is pending     Jinger Neighbors, CPhT-Adv Oncology Pharmacy Patient Advocate [facility] Direct Number: [phone]  Fax: [phone]

## 2023-05-24 NOTE — Telephone Encounter (Signed)
Oral Oncology Pharmacist Encounter   Patient's son has elected to utilize [facility] to obtain Erlotinib for Ms Wenger. Prescription redirected to [facility] for dispensing with email for account attached ([email or web address])   Lenord Carbo, PharmD, BCPS, [facility] Hematology/Oncology Clinical Pharmacist [facility] Oral Chemotherapy Navigation Clinics [phone] 05/24/2023 11:31 AM

## 2023-05-24 NOTE — Telephone Encounter (Signed)
Oral Oncology Patient Advocate Encounter  Prior Authorization for erlotinib has been approved.    PA# [id number] Effective dates: 05/24/23 through 05/23/24  Patients co-pay is $1,294.74  Patient may opt to fill without insurance at [facility]. The price at [facility] is $64.40 plus shipping.   Patient has opted to fill with [facility].  Jinger Neighbors, CPhT-Adv Oncology Pharmacy Patient Advocate [facility] Direct Number: [phone]  Fax: [phone]

## 2023-06-06 ENCOUNTER — Other Ambulatory Visit: Payer: Medicare Other

## 2023-06-06 ENCOUNTER — Ambulatory Visit: Payer: Medicare Other | Admitting: Internal Medicine

## 2023-06-12 ENCOUNTER — Inpatient Hospital Stay: Payer: Medicare Other | Attending: Internal Medicine

## 2023-06-12 ENCOUNTER — Inpatient Hospital Stay (HOSPITAL_BASED_OUTPATIENT_CLINIC_OR_DEPARTMENT_OTHER): Payer: Medicare Other | Admitting: Internal Medicine

## 2023-06-12 VITALS — BP 143/86 | HR 89 | Temp 98.5°F | Resp 16 | Ht 59.0 in | Wt 117.5 lb

## 2023-06-12 DIAGNOSIS — R197 Diarrhea, unspecified: Secondary | ICD-10-CM | POA: Insufficient documentation

## 2023-06-12 DIAGNOSIS — C3490 Malignant neoplasm of unspecified part of unspecified bronchus or lung: Secondary | ICD-10-CM

## 2023-06-12 DIAGNOSIS — R21 Rash and other nonspecific skin eruption: Secondary | ICD-10-CM | POA: Insufficient documentation

## 2023-06-12 DIAGNOSIS — C3431 Malignant neoplasm of lower lobe, right bronchus or lung: Secondary | ICD-10-CM | POA: Insufficient documentation

## 2023-06-12 DIAGNOSIS — Z95828 Presence of other vascular implants and grafts: Secondary | ICD-10-CM

## 2023-06-12 DIAGNOSIS — T451X5A Adverse effect of antineoplastic and immunosuppressive drugs, initial encounter: Secondary | ICD-10-CM | POA: Diagnosis not present

## 2023-06-12 LAB — CBC WITH DIFFERENTIAL/PLATELET
Abs Immature Granulocytes: 0.02 10*3/uL (ref 0.00–0.07)
Basophils Absolute: 0 10*3/uL (ref 0.0–0.1)
Basophils Relative: 0 %
Eosinophils Absolute: 0.1 10*3/uL (ref 0.0–0.5)
Eosinophils Relative: 1 %
HCT: 42.2 % (ref 36.0–46.0)
Hemoglobin: 14.1 g/dL (ref 12.0–15.0)
Immature Granulocytes: 0 %
Lymphocytes Relative: 23 %
Lymphs Abs: 1.9 10*3/uL (ref 0.7–4.0)
MCH: 29.3 pg (ref 26.0–34.0)
MCHC: 33.4 g/dL (ref 30.0–36.0)
MCV: 87.6 fL (ref 80.0–100.0)
Monocytes Absolute: 0.6 10*3/uL (ref 0.1–1.0)
Monocytes Relative: 7 %
Neutro Abs: 5.5 10*3/uL (ref 1.7–7.7)
Neutrophils Relative %: 69 %
Platelets: 261 10*3/uL (ref 150–400)
RBC: 4.82 MIL/uL (ref 3.87–5.11)
RDW: 14.2 % (ref 11.5–15.5)
WBC: 8 10*3/uL (ref 4.0–10.5)
nRBC: 0 % (ref 0.0–0.2)

## 2023-06-12 LAB — COMPREHENSIVE METABOLIC PANEL
ALT: 14 U/L (ref 0–44)
AST: 23 U/L (ref 15–41)
Albumin: 4.4 g/dL (ref 3.5–5.0)
Alkaline Phosphatase: 65 U/L (ref 38–126)
Anion gap: 5 (ref 5–15)
BUN: 9 mg/dL (ref 8–23)
CO2: 31 mmol/L (ref 22–32)
Calcium: 9.2 mg/dL (ref 8.9–10.3)
Chloride: 106 mmol/L (ref 98–111)
Creatinine, Ser: 0.58 mg/dL (ref 0.44–1.00)
GFR, Estimated: >60 mL/min (ref >60)
Glucose, Bld: 78 mg/dL (ref 70–99)
Potassium: 3.5 mmol/L (ref 3.5–5.1)
Sodium: 142 mmol/L (ref 135–145)
Total Bilirubin: 1.3 mg/dL — ABNORMAL HIGH (ref 0.3–1.2)
Total Protein: 7.4 g/dL (ref 6.5–8.1)

## 2023-06-12 MED ORDER — SODIUM CHLORIDE 0.9% FLUSH
10.0000 mL | INTRAVENOUS | Status: DC | PRN
  Administered 2023-06-12: 10 mL via INTRAVENOUS

## 2023-06-12 MED ORDER — HEPARIN SOD (PORK) LOCK FLUSH 100 UNIT/ML IV SOLN
500.0000 [IU] | Freq: Once | INTRAVENOUS | Status: AC | PRN
  Administered 2023-06-12: 500 [IU] via INTRAVENOUS

## 2023-06-12 NOTE — Progress Notes (Signed)
#        [facility] Telephone:([phone]   Fax:(336) [phone]  OFFICE PROGRESS NOTE  Kathryn Quint, MD [address] [place] [address]  PRINCIPAL DIAGNOSIS: Local recurrence of non-small cell lung cancer, adenocarcinoma, initially diagnosed as stage IB (T2a N0 M0) in March of 2010.   PRIOR THERAPY:  Status post right lower lobectomy under the care of Dr. Laneta Simmers on 04/08/2009. Status post palliative radiotherapy to the right lower lobe recurrent lung mass under the care of Dr. Mitzi Hansen, completed on June 08, 2011. Systemic chemotherapy with carboplatin for AUC of 5 and Alimta 500 mg/M2 every 3 weeks. She is status post 3 cycles.  CURRENT THERAPY: Tarceva 150 mg by mouth daily, therapy beginning 07/24/2012. Status post approximately 122 months of therapy.   INTERVAL HISTORY: [name] 62 y.o. female returns to the clinic today for follow-up visit accompanied by her interpreter.  The patient is feeling fine today with no concerning complaints except for dry cough with sometimes occasional blood.  She also has some intermittent abdominal pain especially in the right upper quadrant that last for few minutes and disappear.  She denied having any current nausea, vomiting, diarrhea or constipation.  She has no headache or visual changes.  She has no fever or chills.  She lost few pounds since her last visit.  She is here today for evaluation and repeat blood work.   MEDICAL HISTORY: Past Medical History:  Diagnosis Date   Drug-induced skin rash 02/27/2017   Encounter for antineoplastic chemotherapy 01/19/2016   History of radiation therapy 05/02/11 to 06/08/11   right lung   Lung cancer (HCC)    right lower lobe adenocarcinoma    ALLERGIES:  is allergic to codeine and doxycycline.  MEDICATIONS:  Current Outpatient Medications  Medication Sig Dispense Refill   albuterol (PROAIR HFA) 108 (90 Base) MCG/ACT inhaler Inhale 1-2 puffs into the lungs every 6 (six)  hours as needed for wheezing or shortness of breath. 1 Inhaler 2   chlorpheniramine-HYDROcodone (TUSSIONEX) 10-8 MG/5ML Take 5 mLs by mouth every 12 hours as needed for cough. 473 mL 0   diltiazem (TIADYLT ER) 120 MG 24 hr capsule Take 1 capsule by mouth daily. 90 capsule 3   erlotinib (TARCEVA) 150 MG tablet Take 1 tablet (150 mg total) by mouth daily. Take on an empty stomach at least 1 hour before or 2 hours after food. 30 tablet 11   No current facility-administered medications for this visit.    SURGICAL HISTORY:  Past Surgical History:  Procedure Laterality Date   LUNG LOBECTOMY  04/18/2009   RLL   PORTACATH PLACEMENT  02/29/2012   Procedure: INSERTION PORT-A-CATH;  Surgeon: Ines Bloomer, MD;  Location: [facility] OR;  Service: Thoracic;  Laterality: Left;  PowerPort 8 F attachable in left internal jugular.    REVIEW OF SYSTEMS:  A comprehensive review of systems was negative except for: Respiratory: positive for cough   PHYSICAL EXAMINATION: General appearance: alert, cooperative, and no distress Head: Normocephalic, without obvious abnormality, atraumatic Neck: no adenopathy, no JVD, supple, symmetrical, trachea midline, and thyroid not enlarged, symmetric, no tenderness/mass/nodules Lymph nodes: Cervical, supraclavicular, and axillary nodes normal. Resp: clear to auscultation bilaterally Back: symmetric, no curvature. ROM normal. No CVA tenderness. Cardio: regular rate and rhythm, S1, S2 normal, no murmur, click, rub or gallop GI: soft, non-tender; bowel sounds normal; no masses,  no organomegaly Extremities: extremities normal, atraumatic, no cyanosis or edema  ECOG PERFORMANCE STATUS:  1 - Symptomatic but completely ambulatory  Blood pressure (!) 143/86, pulse 89, temperature 98.5 F (36.9 C), temperature source Oral, resp. rate 16, height 4\' 11"  (1.499 m), weight 117 lb 8 oz (53.3 kg), last menstrual period 02/10/2012, SpO2 98%.  LABORATORY DATA: Lab Results  Component Value  Date   WBC 8.0 06/12/2023   HGB 14.1 06/12/2023   HCT 42.2 06/12/2023   MCV 87.6 06/12/2023   PLT 261 06/12/2023      Chemistry      Component Value Date/Time   NA 140 03/31/2023 0930   NA 143 10/30/2017 1031   K 4.0 03/31/2023 0930   K 4.3 10/30/2017 1031   CL 105 03/31/2023 0930   CL 102 04/30/2013 1002   CO2 30 03/31/2023 0930   CO2 29 10/30/2017 1031   BUN 11 03/31/2023 0930   BUN 14.6 10/30/2017 1031   CREATININE 0.56 03/31/2023 0930   CREATININE 0.70 03/10/2023 0928   CREATININE 0.7 10/30/2017 1031      Component Value Date/Time   CALCIUM 8.8 (L) 03/31/2023 0930   CALCIUM 9.2 10/30/2017 1031   ALKPHOS 70 03/31/2023 0930   ALKPHOS 103 10/30/2017 1031   AST 21 03/31/2023 0930   AST 20 03/10/2023 0928   AST 20 10/30/2017 1031   ALT 16 03/31/2023 0930   ALT 12 03/10/2023 0928   ALT 14 10/30/2017 1031   BILITOT 1.0 03/31/2023 0930   BILITOT 0.8 03/10/2023 0928   BILITOT 0.79 10/30/2017 1031       RADIOGRAPHIC STUDIES: No results found.   ASSESSMENT AND PLAN:  This is a very pleasant 62 years old Asian female with recurrent non-small cell lung cancer, adenocarcinoma status post surgical resection followed by systemic chemotherapy with carboplatin and Alimta followed by disease progression and the patient was started on treatment with Tarceva 150 mg by mouth daily status post 122 months  She has been tolerating this treatment well with no concerning adverse effects. Her previous CT scan of the chest showed suspicious disease recurrence.  I ordered a PET scan which was performed recently and showed no evidence for metabolic activity associated with the right upper lobe pulmonary nodule or evidence for disease recurrence locally and there was some post radiation changes. The patient continues her treatment with Tarceva 150 mg p.o. daily. She is tolerating the treatment well except for the mild skin rash and occasional diarrhea. I recommended for her to continue her  current treatment with the same dose. I will see her back for follow-up visit in 2 months for evaluation and repeat CT scan of the chest. For the diarrhea she will continue on Imodium on as-needed basis. For the skin rash she applies clindamycin lotion on as-needed basis. The patient was advised to call immediately if she has any other concerning symptoms in the interval. All questions were answered. The patient knows to call the clinic with any problems, questions or concerns. We can certainly see the patient much sooner if necessary. The total time spent in the appointment was 20 minutes.  Disclaimer: This note was dictated with voice recognition software. Similar sounding words can inadvertently be transcribed and may not be corrected upon review.

## 2023-06-14 ENCOUNTER — Other Ambulatory Visit (HOSPITAL_COMMUNITY): Payer: Self-pay

## 2023-06-14 ENCOUNTER — Encounter: Payer: Self-pay | Admitting: Internal Medicine

## 2023-06-14 ENCOUNTER — Telehealth: Payer: Self-pay | Admitting: Pharmacy Technician

## 2023-06-14 NOTE — Telephone Encounter (Signed)
Oral Oncology Patient Advocate Encounter   Was successful in securing patient an $4,100 grant from [employer] [facility]) to provide copayment coverage for erlotinib.  This will keep the out of pocket expense at $0.     I have spoken with the patient.    The billing information is as follows and has been shared with [facility].   Member ID: [id number] Group ID: [id number] RxBin: [id number] Dates of Eligibility: 03/15/23 through 06/11/24  Fund:  NSCLC  Jinger Neighbors, CPhT-Adv Oncology Pharmacy Patient Advocate [facility] Direct Number: [phone]  Fax: [phone]

## 2023-06-19 ENCOUNTER — Ambulatory Visit (INDEPENDENT_AMBULATORY_CARE_PROVIDER_SITE_OTHER): Payer: Medicare Other | Admitting: Emergency Medicine

## 2023-06-19 ENCOUNTER — Encounter: Payer: Self-pay | Admitting: Emergency Medicine

## 2023-06-19 ENCOUNTER — Other Ambulatory Visit (HOSPITAL_COMMUNITY): Payer: Self-pay

## 2023-06-19 VITALS — BP 120/74 | HR 103 | Temp 98.4°F | Ht 59.0 in | Wt 121.1 lb

## 2023-06-19 DIAGNOSIS — C3431 Malignant neoplasm of lower lobe, right bronchus or lung: Secondary | ICD-10-CM

## 2023-06-19 DIAGNOSIS — I10 Essential (primary) hypertension: Secondary | ICD-10-CM

## 2023-06-19 DIAGNOSIS — K219 Gastro-esophageal reflux disease without esophagitis: Secondary | ICD-10-CM | POA: Diagnosis not present

## 2023-06-19 DIAGNOSIS — J029 Acute pharyngitis, unspecified: Secondary | ICD-10-CM | POA: Insufficient documentation

## 2023-06-19 MED ORDER — ALBUTEROL SULFATE HFA 108 (90 BASE) MCG/ACT IN AERS
1.0000 | INHALATION_SPRAY | Freq: Four times a day (QID) | RESPIRATORY_TRACT | 2 refills | Status: AC | PRN
Start: 2023-06-19 — End: ?
  Filled 2023-06-19: qty 6.7, 25d supply, fill #0

## 2023-06-19 MED ORDER — AZITHROMYCIN 250 MG PO TABS
ORAL_TABLET | ORAL | 0 refills | Status: DC
Start: 2023-06-19 — End: 2023-08-21
  Filled 2023-06-19: qty 6, 5d supply, fill #0

## 2023-06-19 NOTE — Assessment & Plan Note (Signed)
Stable.  Sees oncologist on a regular basis Continues Tarceva 150 mg daily

## 2023-06-19 NOTE — Progress Notes (Signed)
[name] 62 y.o.   Chief Complaint  Patient presents with   Medical Management of Chronic Issues    f/u appt, no concerns     HISTORY OF PRESENT ILLNESS: This is a 62 y.o. female here for 48-month follow-up of chronic medical conditions. Complaining of sore throat that started 1 week ago  Overall doing well.  Has no complaints or medical concerns today. BP Readings from Last 3 Encounters:  06/12/23 (!) 143/86  04/04/23 (!) 155/99  03/21/23 126/84  History of lung cancer, adenocarcinoma Most recent oncology office visit notes on 06/12/2023 Assessment and plan as follows: ASSESSMENT AND PLAN:  This is a very pleasant 62 years old Asian female with recurrent non-small cell lung cancer, adenocarcinoma status post surgical resection followed by systemic chemotherapy with carboplatin and Alimta followed by disease progression and the patient was started on treatment with Tarceva 150 mg by mouth daily status post 122 months  She has been tolerating this treatment well with no concerning adverse effects. Her previous CT scan of the chest showed suspicious disease recurrence.  I ordered a PET scan which was performed recently and showed no evidence for metabolic activity associated with the right upper lobe pulmonary nodule or evidence for disease recurrence locally and there was some post radiation changes. The patient continues her treatment with Tarceva 150 mg p.o. daily. She is tolerating the treatment well except for the mild skin rash and occasional diarrhea. I recommended for her to continue her current treatment with the same dose. I will see her back for follow-up visit in 2 months for evaluation and repeat CT scan of the chest. For the diarrhea she will continue on Imodium on as-needed basis. For the skin rash she applies clindamycin lotion on as-needed basis. The patient was advised to call immediately if she has any other concerning symptoms in the interval. All questions were  answered. The patient knows to call the clinic with any problems, questions or concerns. We can certainly see the patient much sooner if necessary.   HPI   Prior to Admission medications   Medication Sig Start Date End Date Taking? Authorizing Provider  albuterol (PROAIR HFA) 108 (90 Base) MCG/ACT inhaler Inhale 1-2 puffs into the lungs every 6 (six) hours as needed for wheezing or shortness of breath. 12/24/18  Yes Si Gaul, MD  diltiazem (TIADYLT ER) 120 MG 24 hr capsule Take 1 capsule by mouth daily. 12/09/22   Duke Salvia, MD  erlotinib (TARCEVA) 150 MG tablet Take 1 tablet (150 mg total) by mouth daily. Take on an empty stomach at least 1 hour before or 2 hours after food. 05/24/23   Si Gaul, MD    Allergies  Allergen Reactions   Codeine Nausea Only   Doxycycline Nausea And Vomiting    vomiting and headache    Patient Active Problem List   Diagnosis Date Noted   Seasonal allergic rhinitis due to pollen 06/15/2017   Essential hypertension 05/23/2017   Atrial arrhythmia 05/21/2017   Port catheter in place 12/27/2016   Encounter for antineoplastic chemotherapy 06/13/2016   GERD (gastroesophageal reflux disease) 04/18/2016   Primary cancer of right lower lobe of lung (HCC) 05/06/2009   LEIOMYOMA, UTERUS 05/06/2009    Past Medical History:  Diagnosis Date   Drug-induced skin rash 02/27/2017   Encounter for antineoplastic chemotherapy 01/19/2016   History of radiation therapy 05/02/11 to 06/08/11   right lung   Lung cancer [facility])    right lower lobe adenocarcinoma  Past Surgical History:  Procedure Laterality Date   LUNG LOBECTOMY  04/18/2009   RLL   PORTACATH PLACEMENT  02/29/2012   Procedure: INSERTION PORT-A-CATH;  Surgeon: Ines Bloomer, MD;  Location: [facility] OR;  Service: Thoracic;  Laterality: Left;  PowerPort 8 F attachable in left internal jugular.    Social History   Socioeconomic History   Marital status: Married    Spouse name: Not on file    Number of children: 7   Years of education: Not on file   Highest education level: Not on file  Occupational History   Not on file  Tobacco Use   Smoking status: Never   Smokeless tobacco: Never  Vaping Use   Vaping status: Never Used  Substance and Sexual Activity   Alcohol use: No   Drug use: No   Sexual activity: Yes  Other Topics Concern   Not on file  Social History Narrative   09/01/2014   The patient is a 62 year old [place] woman.   The patient is married and has 7 children. One child passed away.   The patient came to the [place] in approximately 1990. They were in [place] for 10-11 years and then moved to [place], [place] since then.   Patient does not smoke, drink, or use illicit drugs.   Patient has not used smokeless tobacco products.   Fun/Hobby: Go shopping    Social Determinants of Health   Financial Resource Strain: Not on file  Food Insecurity: Not on file  Transportation Needs: Not on file  Physical Activity: Not on file  Stress: Not on file  Social Connections: Not on file  Intimate Partner Violence: Not on file    Family History  Problem Relation Age of Onset   Heart Problems Mother    Cancer Neg Hx      Review of Systems  Constitutional: Negative.  Negative for chills and fever.  HENT:  Positive for sore throat. Negative for congestion.   Respiratory: Negative.  Negative for cough and shortness of breath.   Skin: Negative.  Negative for rash.  All other systems reviewed and are negative.   Today's Vitals   06/19/23 1311  BP: 120/74  Pulse: (!) 103  Temp: 98.4 F (36.9 C)  TempSrc: Oral  SpO2: 93%  Weight: 121 lb 2 oz (54.9 kg)  Height: 4\' 11"  (1.499 m)   Body mass index is 24.46 kg/m.   Physical Exam Vitals reviewed.  Constitutional:      Appearance: Normal appearance.  HENT:     Head: Normocephalic.     Mouth/Throat:     Pharynx: Posterior oropharyngeal erythema present. No oropharyngeal exudate.  Eyes:      Extraocular Movements: Extraocular movements intact.     Pupils: Pupils are equal, round, and reactive to light.  Cardiovascular:     Rate and Rhythm: Normal rate and regular rhythm.     Pulses: Normal pulses.     Heart sounds: Normal heart sounds.  Pulmonary:     Effort: Pulmonary effort is normal.     Breath sounds: Normal breath sounds.  Musculoskeletal:     Cervical back: No tenderness.  Lymphadenopathy:     Cervical: No cervical adenopathy.  Skin:    General: Skin is warm and dry.     Capillary Refill: Capillary refill takes less than 2 seconds.  Neurological:     General: No focal deficit present.     Mental Status: She is alert and oriented to  person, place, and time.  Psychiatric:        Mood and Affect: Mood normal.        Behavior: Behavior normal.      ASSESSMENT & PLAN: A total of 47 minutes was spent with the patient and counseling/coordination of care regarding preparing for this visit, review of most recent office visit notes, review of most recent oncologist office visit notes, review of multiple chronic medical conditions and their management, review of all medications, review of most recent blood work results, cardiovascular risks associated with hypertension, education on nutrition, prognosis, documentation and need for follow-up.  Problem List Items Addressed This Visit       Cardiovascular and Mediastinum   Essential hypertension - Primary    BP Readings from Last 3 Encounters:  06/19/23 120/74  06/12/23 (!) 143/86  04/04/23 (!) 155/99  Well-controlled hypertension Continue diltiazem 120 mg daily         Respiratory   Primary cancer of right lower lobe of lung (HCC)    Stable.  Sees oncologist on a regular basis Continues Tarceva 150 mg daily      Relevant Medications   albuterol (PROAIR HFA) 108 (90 Base) MCG/ACT inhaler   azithromycin (ZITHROMAX) 250 MG tablet   Acute pharyngitis    Active infection Recommend daily azithromycin for 5  days      Relevant Medications   azithromycin (ZITHROMAX) 250 MG tablet     Digestive   GERD (gastroesophageal reflux disease)    Stable and asymptomatic.  On no medications at present time      Patient Instructions  Hypertension, Adult High blood pressure (hypertension) is when the force of blood pumping through the arteries is too strong. The arteries are the blood vessels that carry blood from the heart throughout the body. Hypertension forces the heart to work harder to pump blood and may cause arteries to become narrow or stiff. Untreated or uncontrolled hypertension can lead to a heart attack, heart failure, a stroke, kidney disease, and other problems. A blood pressure reading consists of a higher number over a lower number. Ideally, your blood pressure should be below 120/80. The first ("top") number is called the systolic pressure. It is a measure of the pressure in your arteries as your heart beats. The second ("bottom") number is called the diastolic pressure. It is a measure of the pressure in your arteries as the heart relaxes. What are the causes? The exact cause of this condition is not known. There are some conditions that result in high blood pressure. What increases the risk? Certain factors may make you more likely to develop high blood pressure. Some of these risk factors are under your control, including: Smoking. Not getting enough exercise or physical activity. Being overweight. Having too much fat, sugar, calories, or salt (sodium) in your diet. Drinking too much alcohol. Other risk factors include: Having a personal history of heart disease, diabetes, high cholesterol, or kidney disease. Stress. Having a family history of high blood pressure and high cholesterol. Having obstructive sleep apnea. Age. The risk increases with age. What are the signs or symptoms? High blood pressure may not cause symptoms. Very high blood pressure (hypertensive crisis) may  cause: Headache. Fast or irregular heartbeats (palpitations). Shortness of breath. Nosebleed. Nausea and vomiting. Vision changes. Severe chest pain, dizziness, and seizures. How is this diagnosed? This condition is diagnosed by measuring your blood pressure while you are seated, with your arm resting on a flat surface, your legs uncrossed,  and your feet flat on the floor. The cuff of the blood pressure monitor will be placed directly against the skin of your upper arm at the level of your heart. Blood pressure should be measured at least twice using the same arm. Certain conditions can cause a difference in blood pressure between your right and left arms. If you have a high blood pressure reading during one visit or you have normal blood pressure with other risk factors, you may be asked to: Return on a different day to have your blood pressure checked again. Monitor your blood pressure at home for 1 week or longer. If you are diagnosed with hypertension, you may have other blood or imaging tests to help your health care provider understand your overall risk for other conditions. How is this treated? This condition is treated by making healthy lifestyle changes, such as eating healthy foods, exercising more, and reducing your alcohol intake. You may be referred for counseling on a healthy diet and physical activity. Your health care provider may prescribe medicine if lifestyle changes are not enough to get your blood pressure under control and if: Your systolic blood pressure is above 130. Your diastolic blood pressure is above 80. Your personal target blood pressure may vary depending on your medical conditions, your age, and other factors. Follow these instructions at home: Eating and drinking  Eat a diet that is high in fiber and potassium, and low in sodium, added sugar, and fat. An example of this eating plan is called the DASH diet. DASH stands for Dietary Approaches to Stop  Hypertension. To eat this way: Eat plenty of fresh fruits and vegetables. Try to fill one half of your plate at each meal with fruits and vegetables. Eat whole grains, such as whole-wheat pasta, brown rice, or whole-grain bread. Fill about one fourth of your plate with whole grains. Eat or drink low-fat dairy products, such as skim milk or low-fat yogurt. Avoid fatty cuts of meat, processed or cured meats, and poultry with skin. Fill about one fourth of your plate with lean proteins, such as fish, chicken without skin, beans, eggs, or tofu. Avoid pre-made and processed foods. These tend to be higher in sodium, added sugar, and fat. Reduce your daily sodium intake. Many people with hypertension should eat less than 1,500 mg of sodium a day. Do not drink alcohol if: Your health care provider tells you not to drink. You are pregnant, may be pregnant, or are planning to become pregnant. If you drink alcohol: Limit how much you have to: 0-1 drink a day for women. 0-2 drinks a day for men. Know how much alcohol is in your drink. In the [place] one drink equals one 12 oz bottle of beer (355 mL), one 5 oz glass of wine (148 mL), or one 1 oz glass of hard liquor (44 mL). Lifestyle  Work with your health care provider to maintain a healthy body weight or to lose weight. Ask what an ideal weight is for you. Get at least 30 minutes of exercise that causes your heart to beat faster (aerobic exercise) most days of the week. Activities may include walking, swimming, or biking. Include exercise to strengthen your muscles (resistance exercise), such as Pilates or lifting weights, as part of your weekly exercise routine. Try to do these types of exercises for 30 minutes at least 3 days a week. Do not use any products that contain nicotine or tobacco. These products include cigarettes, chewing tobacco, and vaping devices, such  as e-cigarettes. If you need help quitting, ask your health care provider. Monitor your  blood pressure at home as told by your health care provider. Keep all follow-up visits. This is important. Medicines Take over-the-counter and prescription medicines only as told by your health care provider. Follow directions carefully. Blood pressure medicines must be taken as prescribed. Do not skip doses of blood pressure medicine. Doing this puts you at risk for problems and can make the medicine less effective. Ask your health care provider about side effects or reactions to medicines that you should watch for. Contact a health care provider if you: Think you are having a reaction to a medicine you are taking. Have headaches that keep coming back (recurring). Feel dizzy. Have swelling in your ankles. Have trouble with your vision. Get help right away if you: Develop a severe headache or confusion. Have unusual weakness or numbness. Feel faint. Have severe pain in your chest or abdomen. Vomit repeatedly. Have trouble breathing. These symptoms may be an emergency. Get help right away. Call 911. Do not wait to see if the symptoms will go away. Do not drive yourself to the hospital. Summary Hypertension is when the force of blood pumping through your arteries is too strong. If this condition is not controlled, it may put you at risk for serious complications. Your personal target blood pressure may vary depending on your medical conditions, your age, and other factors. For most people, a normal blood pressure is less than 120/80. Hypertension is treated with lifestyle changes, medicines, or a combination of both. Lifestyle changes include losing weight, eating a healthy, low-sodium diet, exercising more, and limiting alcohol. This information is not intended to replace advice given to you by your health care provider. Make sure you discuss any questions you have with your health care provider. Document Revised: 09/21/2021 Document Reviewed: 09/21/2021 Elsevier Patient Education  2024  Elsevier Inc.     Edwina Barth, MD [facility]

## 2023-06-19 NOTE — Assessment & Plan Note (Signed)
Active infection Recommend daily azithromycin for 5 days

## 2023-06-19 NOTE — Assessment & Plan Note (Signed)
BP Readings from Last 3 Encounters:  06/19/23 120/74  06/12/23 (!) 143/86  04/04/23 (!) 155/99  Well-controlled hypertension Continue diltiazem 120 mg daily

## 2023-06-19 NOTE — Assessment & Plan Note (Signed)
Stable and asymptomatic. On no medications at present time.

## 2023-06-19 NOTE — Patient Instructions (Signed)
Hypertension, Adult High blood pressure (hypertension) is when the force of blood pumping through the arteries is too strong. The arteries are the blood vessels that carry blood from the heart throughout the body. Hypertension forces the heart to work harder to pump blood and may cause arteries to become narrow or stiff. Untreated or uncontrolled hypertension can lead to a heart attack, heart failure, a stroke, kidney disease, and other problems. A blood pressure reading consists of a higher number over a lower number. Ideally, your blood pressure should be below 120/80. The first ("top") number is called the systolic pressure. It is a measure of the pressure in your arteries as your heart beats. The second ("bottom") number is called the diastolic pressure. It is a measure of the pressure in your arteries as the heart relaxes. What are the causes? The exact cause of this condition is not known. There are some conditions that result in high blood pressure. What increases the risk? Certain factors may make you more likely to develop high blood pressure. Some of these risk factors are under your control, including: Smoking. Not getting enough exercise or physical activity. Being overweight. Having too much fat, sugar, calories, or salt (sodium) in your diet. Drinking too much alcohol. Other risk factors include: Having a personal history of heart disease, diabetes, high cholesterol, or kidney disease. Stress. Having a family history of high blood pressure and high cholesterol. Having obstructive sleep apnea. Age. The risk increases with age. What are the signs or symptoms? High blood pressure may not cause symptoms. Very high blood pressure (hypertensive crisis) may cause: Headache. Fast or irregular heartbeats (palpitations). Shortness of breath. Nosebleed. Nausea and vomiting. Vision changes. Severe chest pain, dizziness, and seizures. How is this diagnosed? This condition is diagnosed by  measuring your blood pressure while you are seated, with your arm resting on a flat surface, your legs uncrossed, and your feet flat on the floor. The cuff of the blood pressure monitor will be placed directly against the skin of your upper arm at the level of your heart. Blood pressure should be measured at least twice using the same arm. Certain conditions can cause a difference in blood pressure between your right and left arms. If you have a high blood pressure reading during one visit or you have normal blood pressure with other risk factors, you may be asked to: Return on a different day to have your blood pressure checked again. Monitor your blood pressure at home for 1 week or longer. If you are diagnosed with hypertension, you may have other blood or imaging tests to help your health care provider understand your overall risk for other conditions. How is this treated? This condition is treated by making healthy lifestyle changes, such as eating healthy foods, exercising more, and reducing your alcohol intake. You may be referred for counseling on a healthy diet and physical activity. Your health care provider may prescribe medicine if lifestyle changes are not enough to get your blood pressure under control and if: Your systolic blood pressure is above 130. Your diastolic blood pressure is above 80. Your personal target blood pressure may vary depending on your medical conditions, your age, and other factors. Follow these instructions at home: Eating and drinking  Eat a diet that is high in fiber and potassium, and low in sodium, added sugar, and fat. An example of this eating plan is called the DASH diet. DASH stands for Dietary Approaches to Stop Hypertension. To eat this way: Eat   plenty of fresh fruits and vegetables. Try to fill one half of your plate at each meal with fruits and vegetables. Eat whole grains, such as whole-wheat pasta, brown rice, or whole-grain bread. Fill about one  fourth of your plate with whole grains. Eat or drink low-fat dairy products, such as skim milk or low-fat yogurt. Avoid fatty cuts of meat, processed or cured meats, and poultry with skin. Fill about one fourth of your plate with lean proteins, such as fish, chicken without skin, beans, eggs, or tofu. Avoid pre-made and processed foods. These tend to be higher in sodium, added sugar, and fat. Reduce your daily sodium intake. Many people with hypertension should eat less than 1,500 mg of sodium a day. Do not drink alcohol if: Your health care provider tells you not to drink. You are pregnant, may be pregnant, or are planning to become pregnant. If you drink alcohol: Limit how much you have to: 0-1 drink a day for women. 0-2 drinks a day for men. Know how much alcohol is in your drink. In the [place] one drink equals one 12 oz bottle of beer (355 mL), one 5 oz glass of wine (148 mL), or one 1 oz glass of hard liquor (44 mL). Lifestyle  Work with your health care provider to maintain a healthy body weight or to lose weight. Ask what an ideal weight is for you. Get at least 30 minutes of exercise that causes your heart to beat faster (aerobic exercise) most days of the week. Activities may include walking, swimming, or biking. Include exercise to strengthen your muscles (resistance exercise), such as Pilates or lifting weights, as part of your weekly exercise routine. Try to do these types of exercises for 30 minutes at least 3 days a week. Do not use any products that contain nicotine or tobacco. These products include cigarettes, chewing tobacco, and vaping devices, such as e-cigarettes. If you need help quitting, ask your health care provider. Monitor your blood pressure at home as told by your health care provider. Keep all follow-up visits. This is important. Medicines Take over-the-counter and prescription medicines only as told by your health care provider. Follow directions carefully. Blood  pressure medicines must be taken as prescribed. Do not skip doses of blood pressure medicine. Doing this puts you at risk for problems and can make the medicine less effective. Ask your health care provider about side effects or reactions to medicines that you should watch for. Contact a health care provider if you: Think you are having a reaction to a medicine you are taking. Have headaches that keep coming back (recurring). Feel dizzy. Have swelling in your ankles. Have trouble with your vision. Get help right away if you: Develop a severe headache or confusion. Have unusual weakness or numbness. Feel faint. Have severe pain in your chest or abdomen. Vomit repeatedly. Have trouble breathing. These symptoms may be an emergency. Get help right away. Call 911. Do not wait to see if the symptoms will go away. Do not drive yourself to the hospital. Summary Hypertension is when the force of blood pumping through your arteries is too strong. If this condition is not controlled, it may put you at risk for serious complications. Your personal target blood pressure may vary depending on your medical conditions, your age, and other factors. For most people, a normal blood pressure is less than 120/80. Hypertension is treated with lifestyle changes, medicines, or a combination of both. Lifestyle changes include losing weight, eating a healthy,   low-sodium diet, exercising more, and limiting alcohol. This information is not intended to replace advice given to you by your health care provider. Make sure you discuss any questions you have with your health care provider. Document Revised: 09/21/2021 Document Reviewed: 09/21/2021 Elsevier Patient Education  2024 Elsevier Inc.  

## 2023-06-30 ENCOUNTER — Other Ambulatory Visit: Payer: Self-pay | Admitting: Pharmacist

## 2023-06-30 ENCOUNTER — Other Ambulatory Visit: Payer: Self-pay

## 2023-06-30 ENCOUNTER — Other Ambulatory Visit (HOSPITAL_COMMUNITY): Payer: Self-pay

## 2023-06-30 DIAGNOSIS — C3431 Malignant neoplasm of lower lobe, right bronchus or lung: Secondary | ICD-10-CM

## 2023-06-30 DIAGNOSIS — C3491 Malignant neoplasm of unspecified part of right bronchus or lung: Secondary | ICD-10-CM

## 2023-06-30 DIAGNOSIS — Z95828 Presence of other vascular implants and grafts: Secondary | ICD-10-CM

## 2023-06-30 MED ORDER — ERLOTINIB HCL 150 MG PO TABS
150.0000 mg | ORAL_TABLET | Freq: Every day | ORAL | 10 refills | Status: DC
Start: 2023-06-30 — End: 2023-10-04
  Filled 2023-06-30 – 2023-07-10 (×3): qty 30, 30d supply, fill #0
  Filled 2023-07-27: qty 30, 30d supply, fill #1
  Filled 2023-08-29: qty 30, 30d supply, fill #2
  Filled 2023-09-25: qty 30, 30d supply, fill #3

## 2023-06-30 NOTE — Progress Notes (Signed)
Oral Oncology Pharmacist Encounter   Grant obtained for patient to help bring their copay for Erlotinib to $0. Remaining refills redirected from [facility] to [facility] at [facility] for dispensing.   Patient will go back to filling through [facility] at [facility] going forward.    Lenord Carbo, PharmD, BCPS, BCOP Hematology/Oncology Clinical Pharmacist [facility] Oral Chemotherapy Navigation Clinics [phone] 06/30/2023 8:29 AM

## 2023-07-07 ENCOUNTER — Other Ambulatory Visit: Payer: Self-pay

## 2023-07-07 ENCOUNTER — Other Ambulatory Visit (HOSPITAL_COMMUNITY): Payer: Self-pay

## 2023-07-10 ENCOUNTER — Other Ambulatory Visit: Payer: Self-pay

## 2023-07-10 ENCOUNTER — Other Ambulatory Visit (HOSPITAL_COMMUNITY): Payer: Self-pay

## 2023-07-19 ENCOUNTER — Other Ambulatory Visit (HOSPITAL_COMMUNITY): Payer: Self-pay

## 2023-07-19 ENCOUNTER — Telehealth: Payer: Self-pay | Admitting: Internal Medicine

## 2023-07-19 MED ORDER — DILTIAZEM HCL ER BEADS 120 MG PO CP24: 120.0000 mg | ORAL_CAPSULE | Freq: Every day | ORAL | 2 refills | Status: DC

## 2023-07-19 NOTE — Telephone Encounter (Signed)
*  STAT* If patient is at the pharmacy, call can be transferred to refill team.   1. Which medications need to be refilled? (please list name of each medication and dose if known) diltiazem (TIADYLT ER) 120 MG 24 hr capsule  2. Which pharmacy/location (including street and city if local pharmacy) is medication to be sent to? [id number] [place] [address] [facility] [address].  3. Do they need a 30 day or 90 day supply? [place]

## 2023-07-19 NOTE — Telephone Encounter (Signed)
Pt's medication was sent to pt's pharmacy as requested. Confirmation received.  °

## 2023-07-26 ENCOUNTER — Other Ambulatory Visit (HOSPITAL_COMMUNITY): Payer: Self-pay

## 2023-07-27 ENCOUNTER — Other Ambulatory Visit (HOSPITAL_COMMUNITY): Payer: Self-pay

## 2023-07-27 ENCOUNTER — Other Ambulatory Visit: Payer: Self-pay

## 2023-08-07 ENCOUNTER — Inpatient Hospital Stay: Payer: Medicare Other | Attending: Internal Medicine

## 2023-08-07 ENCOUNTER — Ambulatory Visit (HOSPITAL_COMMUNITY)
Admission: RE | Admit: 2023-08-07 | Discharge: 2023-08-07 | Disposition: A | Discharge status: Home / Self Care | Payer: Medicare Other | Source: Ambulatory Visit | Attending: Internal Medicine | Admitting: Internal Medicine

## 2023-08-07 DIAGNOSIS — Z9221 Personal history of antineoplastic chemotherapy: Secondary | ICD-10-CM | POA: Insufficient documentation

## 2023-08-07 DIAGNOSIS — R21 Rash and other nonspecific skin eruption: Secondary | ICD-10-CM | POA: Insufficient documentation

## 2023-08-07 DIAGNOSIS — J90 Pleural effusion, not elsewhere classified: Secondary | ICD-10-CM | POA: Diagnosis not present

## 2023-08-07 DIAGNOSIS — C3490 Malignant neoplasm of unspecified part of unspecified bronchus or lung: Secondary | ICD-10-CM

## 2023-08-07 DIAGNOSIS — C3431 Malignant neoplasm of lower lobe, right bronchus or lung: Secondary | ICD-10-CM | POA: Diagnosis not present

## 2023-08-07 DIAGNOSIS — Z923 Personal history of irradiation: Secondary | ICD-10-CM | POA: Insufficient documentation

## 2023-08-07 DIAGNOSIS — R197 Diarrhea, unspecified: Secondary | ICD-10-CM | POA: Insufficient documentation

## 2023-08-07 DIAGNOSIS — R059 Cough, unspecified: Secondary | ICD-10-CM | POA: Insufficient documentation

## 2023-08-07 DIAGNOSIS — Z902 Acquired absence of lung [part of]: Secondary | ICD-10-CM | POA: Insufficient documentation

## 2023-08-07 DIAGNOSIS — J929 Pleural plaque without asbestos: Secondary | ICD-10-CM | POA: Diagnosis not present

## 2023-08-07 DIAGNOSIS — R911 Solitary pulmonary nodule: Secondary | ICD-10-CM | POA: Insufficient documentation

## 2023-08-07 DIAGNOSIS — Z79899 Other long term (current) drug therapy: Secondary | ICD-10-CM | POA: Insufficient documentation

## 2023-08-07 LAB — CBC WITH DIFFERENTIAL (CANCER CENTER ONLY)
Abs Immature Granulocytes: 0.02 10*3/uL (ref 0.00–0.07)
Basophils Absolute: 0 10*3/uL (ref 0.0–0.1)
Basophils Relative: 1 %
Eosinophils Absolute: 0 10*3/uL (ref 0.0–0.5)
Eosinophils Relative: 1 %
HCT: 43.5 % (ref 36.0–46.0)
Hemoglobin: 14.2 g/dL (ref 12.0–15.0)
Immature Granulocytes: 0 %
Lymphocytes Relative: 26 %
Lymphs Abs: 2.1 10*3/uL (ref 0.7–4.0)
MCH: 29.2 pg (ref 26.0–34.0)
MCHC: 32.6 g/dL (ref 30.0–36.0)
MCV: 89.3 fL (ref 80.0–100.0)
Monocytes Absolute: 0.5 10*3/uL (ref 0.1–1.0)
Monocytes Relative: 6 %
Neutro Abs: 5.6 10*3/uL (ref 1.7–7.7)
Neutrophils Relative %: 66 %
Platelet Count: 287 10*3/uL (ref 150–400)
RBC: 4.87 MIL/uL (ref 3.87–5.11)
RDW: 13.7 % (ref 11.5–15.5)
WBC Count: 8.3 10*3/uL (ref 4.0–10.5)
nRBC: 0 % (ref 0.0–0.2)

## 2023-08-07 LAB — CMP (CANCER CENTER ONLY)
ALT: 18 U/L (ref 0–44)
AST: 28 U/L (ref 15–41)
Albumin: 4.6 g/dL (ref 3.5–5.0)
Alkaline Phosphatase: 72 U/L (ref 38–126)
Anion gap: 8 (ref 5–15)
BUN: 14 mg/dL (ref 8–23)
CO2: 28 mmol/L (ref 22–32)
Calcium: 9.5 mg/dL (ref 8.9–10.3)
Chloride: 104 mmol/L (ref 98–111)
Creatinine: 0.57 mg/dL (ref 0.44–1.00)
GFR, Estimated: >60 mL/min (ref >60)
Glucose, Bld: 85 mg/dL (ref 70–99)
Potassium: 3.9 mmol/L (ref 3.5–5.1)
Sodium: 140 mmol/L (ref 135–145)
Total Bilirubin: 1.6 mg/dL — ABNORMAL HIGH (ref 0.3–1.2)
Total Protein: 7.9 g/dL (ref 6.5–8.1)

## 2023-08-07 MED ORDER — HEPARIN SOD (PORK) LOCK FLUSH 100 UNIT/ML IV SOLN
INTRAVENOUS | Status: AC
  Filled 2023-08-07: qty 5

## 2023-08-07 MED ORDER — IOHEXOL 300 MG/ML  SOLN
75.0000 mL | Freq: Once | INTRAMUSCULAR | Status: AC | PRN
  Administered 2023-08-07: 75 mL via INTRAVENOUS

## 2023-08-07 MED ORDER — HEPARIN SOD (PORK) LOCK FLUSH 100 UNIT/ML IV SOLN
500.0000 [IU] | Freq: Once | INTRAVENOUS | Status: AC
  Administered 2023-08-07: 500 [IU] via INTRAVENOUS

## 2023-08-07 MED ORDER — SODIUM CHLORIDE (PF) 0.9 % IJ SOLN
INTRAMUSCULAR | Status: AC
  Filled 2023-08-07: qty 50

## 2023-08-14 ENCOUNTER — Inpatient Hospital Stay (HOSPITAL_BASED_OUTPATIENT_CLINIC_OR_DEPARTMENT_OTHER): Payer: Medicare Other | Admitting: Internal Medicine

## 2023-08-14 ENCOUNTER — Other Ambulatory Visit: Payer: Self-pay | Admitting: Internal Medicine

## 2023-08-14 ENCOUNTER — Encounter: Payer: Self-pay | Admitting: Internal Medicine

## 2023-08-14 ENCOUNTER — Other Ambulatory Visit (HOSPITAL_COMMUNITY): Payer: Self-pay

## 2023-08-14 VITALS — BP 141/96 | HR 96 | Temp 98.4°F | Resp 14 | Ht 59.0 in | Wt 118.6 lb

## 2023-08-14 DIAGNOSIS — Z902 Acquired absence of lung [part of]: Secondary | ICD-10-CM | POA: Diagnosis not present

## 2023-08-14 DIAGNOSIS — R21 Rash and other nonspecific skin eruption: Secondary | ICD-10-CM | POA: Diagnosis not present

## 2023-08-14 DIAGNOSIS — Z9221 Personal history of antineoplastic chemotherapy: Secondary | ICD-10-CM | POA: Diagnosis not present

## 2023-08-14 DIAGNOSIS — Z79899 Other long term (current) drug therapy: Secondary | ICD-10-CM | POA: Diagnosis not present

## 2023-08-14 DIAGNOSIS — C3431 Malignant neoplasm of lower lobe, right bronchus or lung: Secondary | ICD-10-CM

## 2023-08-14 DIAGNOSIS — Z923 Personal history of irradiation: Secondary | ICD-10-CM | POA: Diagnosis not present

## 2023-08-14 DIAGNOSIS — R059 Cough, unspecified: Secondary | ICD-10-CM | POA: Diagnosis not present

## 2023-08-14 DIAGNOSIS — R911 Solitary pulmonary nodule: Secondary | ICD-10-CM | POA: Diagnosis not present

## 2023-08-14 DIAGNOSIS — R197 Diarrhea, unspecified: Secondary | ICD-10-CM | POA: Diagnosis not present

## 2023-08-14 MED ORDER — HYDROCOD POLI-CHLORPHE POLI ER 10-8 MG/5ML PO SUER
5.0000 mL | Freq: Two times a day (BID) | ORAL | 0 refills | Status: DC | PRN
  Filled 2023-08-14 (×2): qty 115, 12d supply, fill #0

## 2023-08-14 MED ORDER — CLINDAMYCIN PHOSPHATE 1 % EX GEL
Freq: Two times a day (BID) | CUTANEOUS | 0 refills | Status: DC
  Filled 2023-08-14: qty 30, 20d supply, fill #0
  Filled 2023-08-14: qty 30, 10d supply, fill #0

## 2023-08-14 MED ORDER — HYDROCODONE BIT-HOMATROP MBR 5-1.5 MG/5ML PO SOLN
5.0000 mL | Freq: Four times a day (QID) | ORAL | 0 refills | Status: DC | PRN
  Filled 2023-08-14 (×2): qty 120, 6d supply, fill #0

## 2023-08-14 NOTE — Progress Notes (Signed)
#        [facility] Telephone:(336) [phone]   [phone]  OFFICE PROGRESS NOTE  [name], MD [address] [place] [address]  PRINCIPAL DIAGNOSIS: Local recurrence of non-small cell lung cancer, adenocarcinoma, initially diagnosed as stage IB (T2a N0 M0) in March of 2010.   PRIOR THERAPY:  Status post right lower lobectomy under the care of Dr. Laneta Simmers on 04/08/2009. Status post palliative radiotherapy to the right lower lobe recurrent lung mass under the care of Dr. Mitzi Hansen, completed on June 08, 2011. Systemic chemotherapy with carboplatin for AUC of 5 and Alimta 500 mg/M2 every 3 weeks. She is status post 3 cycles.  CURRENT THERAPY: Tarceva 150 mg by mouth daily, therapy beginning 07/24/2012. Status post approximately 124 months of therapy.   INTERVAL HISTORY: [name] 62 y.o. female returns to the clinic today for follow-up visit accompanied by her interpreter.  The patient is feeling fine today with no concerning complaints except for the dry cough with occasional mild hemoptysis.  She denied having any current chest pain, shortness of breath or frank hemoptysis.  She denied having any fever or chills.  She has no nausea, vomiting but has occasional diarrhea with no recent weight loss or night sweats.  She has no headache or visual changes.  She has been tolerating her treatment with Tarceva fairly well.  She is here today for evaluation with repeat CT scan of the chest for restaging of her disease.   MEDICAL HISTORY: Past Medical History:  Diagnosis Date   Drug-induced skin rash 02/27/2017   Encounter for antineoplastic chemotherapy 01/19/2016   History of radiation therapy 05/02/11 to 06/08/11   right lung   Lung cancer (HCC)    right lower lobe adenocarcinoma    ALLERGIES:  is allergic to codeine and doxycycline.  MEDICATIONS:  Current Outpatient Medications  Medication Sig Dispense Refill   albuterol (PROAIR HFA) 108 (90 Base)  MCG/ACT inhaler Inhale 1-2 puffs into the lungs every 6 (six) hours as needed for wheezing or shortness of breath. 6.7 g 2   azithromycin (ZITHROMAX) 250 MG tablet Take 2 tablets by mouth now.  Then take 1 tablet by mouth once a day for 4 more days. 6 tablet 0   diltiazem (TIADYLT ER) 120 MG 24 hr capsule Take 1 capsule by mouth daily. 90 capsule 2   erlotinib (TARCEVA) 150 MG tablet Take 1 tablet (150 mg total) by mouth daily. Take on an empty stomach at least 1 hour before or 2 hours after food. 30 tablet 10   No current facility-administered medications for this visit.    SURGICAL HISTORY:  Past Surgical History:  Procedure Laterality Date   LUNG LOBECTOMY  04/18/2009   RLL   PORTACATH PLACEMENT  02/29/2012   Procedure: INSERTION PORT-A-CATH;  Surgeon: Ines Bloomer, MD;  Location: [facility] OR;  Service: Thoracic;  Laterality: Left;  PowerPort 8 F attachable in left internal jugular.    REVIEW OF SYSTEMS:  Constitutional: negative Eyes: negative Ears, nose, mouth, throat, and face: negative Respiratory: positive for cough Cardiovascular: negative Gastrointestinal: negative Genitourinary:negative Integument/breast: negative Hematologic/lymphatic: negative Musculoskeletal:negative Neurological: negative Behavioral/Psych: negative Endocrine: negative Allergic/Immunologic: negative   PHYSICAL EXAMINATION: General appearance: alert, cooperative, and no distress Head: Normocephalic, without obvious abnormality, atraumatic Neck: no adenopathy, no JVD, supple, symmetrical, trachea midline, and thyroid not enlarged, symmetric, no tenderness/mass/nodules Lymph nodes: Cervical, supraclavicular, and axillary nodes normal. Resp: clear to auscultation bilaterally Back: symmetric, no curvature. ROM  normal. No CVA tenderness. Cardio: regular rate and rhythm, S1, S2 normal, no murmur, click, rub or gallop GI: soft, non-tender; bowel sounds normal; no masses,  no organomegaly Extremities:  extremities normal, atraumatic, no cyanosis or edema Neurologic: Alert and oriented X 3, normal strength and tone. Normal symmetric reflexes. Normal coordination and gait  ECOG PERFORMANCE STATUS: 1 - Symptomatic but completely ambulatory  Blood pressure (!) 141/96, pulse 96, temperature 98.4 F (36.9 C), temperature source Oral, resp. rate 14, height 4\' 11"  (1.499 m), weight 118 lb 9.6 oz (53.8 kg), last menstrual period 02/10/2012, SpO2 100%.  LABORATORY DATA: Lab Results  Component Value Date   WBC 8.3 08/07/2023   HGB 14.2 08/07/2023   HCT 43.5 08/07/2023   MCV 89.3 08/07/2023   PLT 287 08/07/2023      Chemistry      Component Value Date/Time   NA 140 08/07/2023 1334   NA 143 10/30/2017 1031   K 3.9 08/07/2023 1334   K 4.3 10/30/2017 1031   CL 104 08/07/2023 1334   CL 102 04/30/2013 1002   CO2 28 08/07/2023 1334   CO2 29 10/30/2017 1031   BUN 14 08/07/2023 1334   BUN 14.6 10/30/2017 1031   CREATININE 0.57 08/07/2023 1334   CREATININE 0.7 10/30/2017 1031      Component Value Date/Time   CALCIUM 9.5 08/07/2023 1334   CALCIUM 9.2 10/30/2017 1031   ALKPHOS 72 08/07/2023 1334   ALKPHOS 103 10/30/2017 1031   AST 28 08/07/2023 1334   AST 20 10/30/2017 1031   ALT 18 08/07/2023 1334   ALT 14 10/30/2017 1031   BILITOT 1.6 (H) 08/07/2023 1334   BILITOT 0.79 10/30/2017 1031       RADIOGRAPHIC STUDIES: CT Chest W Contrast  Result Date: 08/14/2023 CLINICAL DATA:  Right lower lobe primary lung adenocarcinoma status post right lower lobectomy EXAM: CT CHEST WITH CONTRAST TECHNIQUE: Multidetector CT imaging of the chest was performed during intravenous contrast administration. RADIATION DOSE REDUCTION: This exam was performed according to the departmental dose-optimization program which includes automated exposure control, adjustment of the mA and/or kV according to patient size and/or use of iterative reconstruction technique. CONTRAST:  75mL OMNIPAQUE IOHEXOL 300 MG/ML  SOLN  COMPARISON:  PET-CT dated Mar 31, 2023; chest CT dated March 10, 2023 FINDINGS: Cardiovascular: Normal heart size. Trace pericardial effusion. Normal caliber thoracic aorta with mild atherosclerotic disease. Mild coronary artery calcifications. Left chest wall port with tip near the superior cavoatrial junction. Mediastinum/Nodes: Esophagus is unremarkable. Unchanged right-sided thyroid nodule measuring 1.9 cm. No enlarged lymph nodes seen in the chest. Lungs/Pleura: Prior right lower lobectomy. Interval increased consolidations and ground-glass opacities adjacent to the posttreatment areas. Increased size of subsolid nodules of the right upper lobe. Reference part solid nodule of the right upper lobe measuring 17 x 9 mm on series 7, image 73, previously 9 x 8 mm, solid component is ill-defined and difficult to measure. Stable trace right pleural effusion and pleural thickening. Upper Abdomen: No acute abnormality. Musculoskeletal: Stable osseous irregularity of the posterior right ribs, likely treatment related. No new osseous lesions. IMPRESSION: 1. Interval increased consolidations and ground-glass opacities adjacent to the posttreatment area, despite lack of significant FDG uptake on prior PET, findings are highly concerning for local recurrence (likely low-grade adenocarcinoma) given progression over multiple prior exams. Consider further evaluation with tissue sampling. 2. Increased size of subsolid nodules of the right upper lobe, concerning for multifocal adenocarcinoma. 3. Stable trace right pleural effusion and pleural thickening.  4. Coronary artery calcifications and aortic Atherosclerosis (ICD10-I70.0). Electronically Signed   By: Allegra Lai M.D.   On: 08/14/2023 10:21     ASSESSMENT AND PLAN:  This is a very pleasant 62 years old Asian female with recurrent non-small cell lung cancer, adenocarcinoma status post surgical resection followed by systemic chemotherapy with carboplatin and Alimta  followed by disease progression and the patient was started on treatment with Tarceva 150 mg by mouth daily status post 124 months  Her previous CT scan of the chest showed suspicious disease recurrence.  I ordered a PET scan which was performed recently and showed no evidence for metabolic activity associated with the right upper lobe pulmonary nodule or evidence for disease recurrence locally and there was some post radiation changes. She continued her treatment with Tarceva with the same dose.  She has been tolerating this treatment fairly well with no concerning adverse effects. She had repeat CT scan of the chest performed recently.  I personally and independently reviewed the scan images and discussed the result with the patient today. Her scan showed continuous increased consolidation and groundglass opacity adjacent to the posttreatment area again concerning for low-grade adenocarcinoma. I discussed the scan results with the patient and recommended for her to see pulmonary medicine for consideration of repeat bronchoscopy to rule out any progressive disease. For the skin rash and diarrhea she will continue with her current treatment with clindamycin lotion and Imodium as needed. For the dry cough I will give her refill of Hycodan. I will see the patient back for follow-up visit in 1 months for evaluation and discussion of her biopsy results and recommendation regarding her condition. She was advised to call immediately if she has any other concerning symptoms in the interval. All questions were answered. The patient knows to call the clinic with any problems, questions or concerns. We can certainly see the patient much sooner if necessary. The total time spent in the appointment was 20 minutes.  Disclaimer: This note was dictated with voice recognition software. Similar sounding words can inadvertently be transcribed and may not be corrected upon review.

## 2023-08-15 ENCOUNTER — Encounter (HOSPITAL_BASED_OUTPATIENT_CLINIC_OR_DEPARTMENT_OTHER): Payer: Self-pay

## 2023-08-21 ENCOUNTER — Encounter: Payer: Self-pay | Admitting: Emergency Medicine

## 2023-08-21 ENCOUNTER — Encounter: Payer: Self-pay | Admitting: Pulmonary Disease

## 2023-08-21 ENCOUNTER — Ambulatory Visit (INDEPENDENT_AMBULATORY_CARE_PROVIDER_SITE_OTHER): Payer: Medicare Other | Admitting: Pulmonary Disease

## 2023-08-21 VITALS — BP 122/72 | HR 100 | Ht 59.0 in | Wt 117.0 lb

## 2023-08-21 DIAGNOSIS — C3431 Malignant neoplasm of lower lobe, right bronchus or lung: Secondary | ICD-10-CM

## 2023-08-21 DIAGNOSIS — R918 Other nonspecific abnormal finding of lung field: Secondary | ICD-10-CM | POA: Diagnosis not present

## 2023-08-21 NOTE — H&P (View-Only) (Signed)
Synopsis: Referred in September 2024 for history of lung cancer by Si Gaul, MD  Subjective:   PATIENT ID: Earley Favor GENDER: female DOB: 12-Mar-1961, MRN: [id number]  Chief Complaint  Patient presents with   Establish Care    RLL cancer    This is a 62 year old female, past medical history of right lower lobe adenocarcinoma treated with chemo targeted therapy and radiation therapy.  Patient was diagnosed initially in March 2010 with a stage Ib, T2a N0 M0 disease.  She was treated with a right lower lobectomy by Dr. Lavinia Sharps.  She had recurrence of disease started on systemic chemotherapy.  She is currently being managed with Tarceva.  She has been tolerating treatment with Tarceva for several years.  She is now status post 124 months.  Repeat CT imaging shows no evidence of hypermetabolic disease with the right lobe pulmonary nodule or evidence of recurrence.  However she does show increased consolidation opacity in the post treatment radiation area concerning for low-grade adenocarcinoma.  So she was referred back to pulmonary for consideration of bronchoscopy and biopsy.  Patient has been put to sleep before.  She not on any blood thinners or antiplatelets or diabetic medications.    Past Medical History:  Diagnosis Date   Drug-induced skin rash 02/27/2017   Encounter for antineoplastic chemotherapy 01/19/2016   History of radiation therapy 05/02/11 to 06/08/11   right lung   Lung cancer [facility])    right lower lobe adenocarcinoma     Family History  Problem Relation Age of Onset   Heart Problems Mother    Cancer Neg Hx      Past Surgical History:  Procedure Laterality Date   LUNG LOBECTOMY  04/18/2009   RLL   PORTACATH PLACEMENT  02/29/2012   Procedure: INSERTION PORT-A-CATH;  Surgeon: Ines Bloomer, MD;  Location: [facility] OR;  Service: Thoracic;  Laterality: Left;  PowerPort 8 F attachable in left internal jugular.    Social History   Socioeconomic History   Marital status:  Married    Spouse name: Not on file   Number of children: 7   Years of education: Not on file   Highest education level: Not on file  Occupational History   Not on file  Tobacco Use   Smoking status: Never   Smokeless tobacco: Never  Vaping Use   Vaping status: Never Used  Substance and Sexual Activity   Alcohol use: No   Drug use: No   Sexual activity: Yes  Other Topics Concern   Not on file  Social History Narrative   09/01/2014   The patient is a 62 year old [place] woman.   The patient is married and has 7 children. One child passed away.   The patient came to the [place] in approximately 1990. They were in [place] for 10-11 years and then moved to [place], [place] since then.   Patient does not smoke, drink, or use illicit drugs.   Patient has not used smokeless tobacco products.   Fun/Hobby: Go shopping    Social Determinants of Health   Financial Resource Strain: Not on file  Food Insecurity: Not on file  Transportation Needs: Not on file  Physical Activity: Not on file  Stress: Not on file  Social Connections: Not on file  Intimate Partner Violence: Not on file     Allergies  Allergen Reactions   Codeine Nausea Only   Doxycycline Nausea And Vomiting    vomiting and headache  Outpatient Medications Prior to Visit  Medication Sig Dispense Refill   albuterol (PROAIR HFA) 108 (90 Base) MCG/ACT inhaler Inhale 1-2 puffs into the lungs every 6 (six) hours as needed for wheezing or shortness of breath. 6.7 g 2   chlorpheniramine-HYDROcodone (TUSSIONEX) 10-8 MG/5ML Take 5 mLs by mouth every 12 (twelve) hours as needed for cough. 115 mL 0   clindamycin (CLINDAGEL) 1 % gel Apply to affected area 2 (two) times daily. 30 g 0   diltiazem (TIADYLT ER) 120 MG 24 hr capsule Take 1 capsule by mouth daily. 90 capsule 2   erlotinib (TARCEVA) 150 MG tablet Take 1 tablet (150 mg total) by mouth daily. Take on an empty stomach at least 1 hour before or 2 hours  after food. 30 tablet 10   azithromycin (ZITHROMAX) 250 MG tablet Take 2 tablets by mouth now.  Then take 1 tablet by mouth once a day for 4 more days. 6 tablet 0   No facility-administered medications prior to visit.    Review of Systems  Constitutional:  Negative for chills, fever, malaise/fatigue and weight loss.  HENT:  Negative for hearing loss, sore throat and tinnitus.   Eyes:  Negative for blurred vision and double vision.  Respiratory:  Negative for cough, hemoptysis, sputum production, shortness of breath, wheezing and stridor.   Cardiovascular:  Negative for chest pain, palpitations, orthopnea, leg swelling and PND.  Gastrointestinal:  Negative for abdominal pain, constipation, diarrhea, heartburn, nausea and vomiting.  Genitourinary:  Negative for dysuria, hematuria and urgency.  Musculoskeletal:  Negative for joint pain and myalgias.  Skin:  Negative for itching and rash.  Neurological:  Negative for dizziness, tingling, weakness and headaches.  Endo/Heme/Allergies:  Negative for environmental allergies. Does not bruise/bleed easily.  Psychiatric/Behavioral:  Negative for depression. The patient is not nervous/anxious and does not have insomnia.   All other systems reviewed and are negative.    Objective:  Physical Exam Vitals reviewed.  Constitutional:      General: She is not in acute distress.    Appearance: She is well-developed.  HENT:     Head: Normocephalic and atraumatic.  Eyes:     General: No scleral icterus.    Conjunctiva/sclera: Conjunctivae normal.     Pupils: Pupils are equal, round, and reactive to light.  Neck:     Vascular: No JVD.     Trachea: No tracheal deviation.  Cardiovascular:     Rate and Rhythm: Normal rate and regular rhythm.     Heart sounds: Normal heart sounds. No murmur heard. Pulmonary:     Effort: Pulmonary effort is normal. No tachypnea, accessory muscle usage or respiratory distress.     Breath sounds: No stridor. No  wheezing, rhonchi or rales.  Abdominal:     General: There is no distension.     Palpations: Abdomen is soft.     Tenderness: There is no abdominal tenderness.  Musculoskeletal:        General: No tenderness.     Cervical back: Neck supple.  Lymphadenopathy:     Cervical: No cervical adenopathy.  Skin:    General: Skin is warm and dry.     Capillary Refill: Capillary refill takes less than 2 seconds.     Findings: No rash.  Neurological:     Mental Status: She is alert and oriented to person, place, and time.  Psychiatric:        Behavior: Behavior normal.      Vitals:   08/21/23 1539  BP: 122/72  Pulse: 100  SpO2: 97%  Weight: 117 lb (53.1 kg)  Height: 4\' 11"  (1.499 m)   97% on RA BMI Readings from Last 3 Encounters:  08/21/23 23.63 kg/m  08/14/23 23.95 kg/m  06/19/23 24.46 kg/m   Wt Readings from Last 3 Encounters:  08/21/23 117 lb (53.1 kg)  08/14/23 118 lb 9.6 oz (53.8 kg)  06/19/23 121 lb 2 oz (54.9 kg)     CBC    Component Value Date/Time   WBC 8.3 08/07/2023 1334   WBC 8.0 06/12/2023 1024   RBC 4.87 08/07/2023 1334   HGB 14.2 08/07/2023 1334   HGB 13.2 10/30/2017 1031   HCT 43.5 08/07/2023 1334   HCT 40.2 10/30/2017 1031   PLT 287 08/07/2023 1334   PLT 306 10/30/2017 1031   MCV 89.3 08/07/2023 1334   MCV 86.1 10/30/2017 1031   MCH 29.2 08/07/2023 1334   MCHC 32.6 08/07/2023 1334   RDW 13.7 08/07/2023 1334   RDW 14.5 10/30/2017 1031   LYMPHSABS 2.1 08/07/2023 1334   LYMPHSABS 1.3 10/30/2017 1031   MONOABS 0.5 08/07/2023 1334   MONOABS 0.5 10/30/2017 1031   EOSABS 0.0 08/07/2023 1334   EOSABS 0.1 10/30/2017 1031   BASOSABS 0.0 08/07/2023 1334   BASOSABS 0.0 10/30/2017 1031     Chest Imaging:  September 2024 CT chest: Interval increase in consolidations and groundglass opacities.  Concerning for local recurrence as well as increased size of subsolid nodules that are located in the right upper lobe concerning for multifocal  adenocarcinoma. The patient's images have been independently reviewed by me.    Pulmonary Functions Testing Results:     No data to display          FeNO:   Pathology:   Echocardiogram:   Heart Catheterization:     Assessment & Plan:     ICD-10-CM   1. Lung mass  R91.8 Procedural/ Surgical Case Request: ROBOTIC ASSISTED NAVIGATIONAL BRONCHOSCOPY    Ambulatory referral to Pulmonology    2. Primary cancer of right lower lobe of lung (HCC)  C34.31       Discussion:  This is a 62 year old female longstanding history of lung cancer has been manage for the past 14 years since diagnosis.  She has recurrence of disease currently on Tarceva.  She now has shown progression on CT imaging and pulm Vidal Schwalbe was consulted for recommendations for consideration of repeat bronchoscopy and biopsy.  Plan: Today in the office we talked but there is benefits alternatives proceed with robotic assisted navigational bronchoscopy tissue sampling. We talked about the risk of bleeding and pneumothorax. Due to availability in endoscopy room and try to get the patient in schedule with my partner Dr. Delton Coombes for next availability. Tentative bronchoscopy date will be on 09/05/2023. Patient is agreeable to proceed with this plan. Discussed today in detail with patient's daughter present as well as interpreter to help with conversation.  We appreciate PCC's help with scheduling.   Current Outpatient Medications:    albuterol (PROAIR HFA) 108 (90 Base) MCG/ACT inhaler, Inhale 1-2 puffs into the lungs every 6 (six) hours as needed for wheezing or shortness of breath., Disp: 6.7 g, Rfl: 2   chlorpheniramine-HYDROcodone (TUSSIONEX) 10-8 MG/5ML, Take 5 mLs by mouth every 12 (twelve) hours as needed for cough., Disp: 115 mL, Rfl: 0   clindamycin (CLINDAGEL) 1 % gel, Apply to affected area 2 (two) times daily., Disp: 30 g, Rfl: 0   diltiazem (TIADYLT ER) 120 MG 24  hr capsule, Take 1 capsule by mouth daily., Disp:  90 capsule, Rfl: 2   erlotinib (TARCEVA) 150 MG tablet, Take 1 tablet (150 mg total) by mouth daily. Take on an empty stomach at least 1 hour before or 2 hours after food., Disp: 30 tablet, Rfl: 10   Josephine Igo, DO [facility] 08/21/2023 4:28 PM

## 2023-08-21 NOTE — Patient Instructions (Signed)
Thank you for visiting Dr. Tonia Brooms at [facility]. Today we recommend the following:  Orders Placed This Encounter  Procedures   Procedural/ Surgical Case Request: ROBOTIC ASSISTED NAVIGATIONAL BRONCHOSCOPY   Ambulatory referral to Pulmonology   Return in about 22 days (around 09/12/2023) for with Kandice Robinsons, NP, after Bronchoscopy.    Please do your part to reduce the spread of COVID-19.

## 2023-08-21 NOTE — Progress Notes (Signed)
Synopsis: Referred in September 2024 for history of lung cancer by Kathryn Gaul, MD  Subjective:   PATIENT ID: [name] GENDER: female DOB: 12-Mar-1961, MRN: [id number]  Chief Complaint  Patient presents with   Establish Care    RLL cancer    This is a 62 year old female, past medical history of right lower lobe adenocarcinoma treated with chemo targeted therapy and radiation therapy.  Patient was diagnosed initially in March 2010 with a stage Ib, T2a N0 M0 disease.  She was treated with a right lower lobectomy by Dr. Lavinia [name].  She had recurrence of disease started on systemic chemotherapy.  She is currently being managed with Tarceva.  She has been tolerating treatment with Tarceva for several years.  She is now status post 124 months.  Repeat CT imaging shows no evidence of hypermetabolic disease with the right lobe pulmonary nodule or evidence of recurrence.  However she does show increased consolidation opacity in the post treatment radiation area concerning for low-grade adenocarcinoma.  So she was referred back to pulmonary for consideration of bronchoscopy and biopsy.  Patient has been put to sleep before.  She not on any blood thinners or antiplatelets or diabetic medications.    Past Medical History:  Diagnosis Date   Drug-induced skin rash 02/27/2017   Encounter for antineoplastic chemotherapy 01/19/2016   History of radiation therapy 05/02/11 to 06/08/11   right lung   Lung cancer [name])    right lower lobe adenocarcinoma     Family History  Problem Relation Age of Onset   Heart Problems Mother    Cancer Neg Hx      Past Surgical History:  Procedure Laterality Date   LUNG LOBECTOMY  04/18/2009   RLL   PORTACATH PLACEMENT  02/29/2012   Procedure: INSERTION PORT-A-CATH;  Surgeon: Kathryn Bloomer, MD;  Location: [facility] OR;  Service: Thoracic;  Laterality: Left;  PowerPort 8 F attachable in left internal jugular.    Social History   Socioeconomic History   Marital status:  Married    Spouse name: Not on file   Number of children: 7   Years of education: Not on file   Highest education level: Not on file  Occupational History   Not on file  Tobacco Use   Smoking status: Never   Smokeless tobacco: Never  Vaping Use   Vaping status: Never Used  Substance and Sexual Activity   Alcohol use: No   Drug use: No   Sexual activity: Yes  Other Topics Concern   Not on file  Social History Narrative   09/01/2014   The patient is a 62 year old [place] woman.   The patient is married and has 7 children. One child passed away.   The patient came to the [place] in approximately 1990. They were in [place] for 10-11 years and then moved to [place], [place] since then.   Patient does not smoke, drink, or use illicit drugs.   Patient has not used smokeless tobacco products.   Fun/Hobby: Go shopping    Social Determinants of Health   Financial Resource Strain: Not on file  Food Insecurity: Not on file  Transportation Needs: Not on file  Physical Activity: Not on file  Stress: Not on file  Social Connections: Not on file  Intimate Partner Violence: Not on file     Allergies  Allergen Reactions   Codeine Nausea Only   Doxycycline Nausea And Vomiting    vomiting and headache  Outpatient Medications Prior to Visit  Medication Sig Dispense Refill   albuterol (PROAIR HFA) 108 (90 Base) MCG/ACT inhaler Inhale 1-2 puffs into the lungs every 6 (six) hours as needed for wheezing or shortness of breath. 6.7 g 2   chlorpheniramine-HYDROcodone (TUSSIONEX) 10-8 MG/5ML Take 5 mLs by mouth every 12 (twelve) hours as needed for cough. 115 mL 0   clindamycin (CLINDAGEL) 1 % gel Apply to affected area 2 (two) times daily. 30 g 0   diltiazem (TIADYLT ER) 120 MG 24 hr capsule Take 1 capsule by mouth daily. 90 capsule 2   erlotinib (TARCEVA) 150 MG tablet Take 1 tablet (150 mg total) by mouth daily. Take on an empty stomach at least 1 hour before or 2 hours  after food. 30 tablet 10   azithromycin (ZITHROMAX) 250 MG tablet Take 2 tablets by mouth now.  Then take 1 tablet by mouth once a day for 4 more days. 6 tablet 0   No facility-administered medications prior to visit.    Review of Systems  Constitutional:  Negative for chills, fever, malaise/fatigue and weight loss.  HENT:  Negative for hearing loss, sore throat and tinnitus.   Eyes:  Negative for blurred vision and double vision.  Respiratory:  Negative for cough, hemoptysis, sputum production, shortness of breath, wheezing and stridor.   Cardiovascular:  Negative for chest pain, palpitations, orthopnea, leg swelling and PND.  Gastrointestinal:  Negative for abdominal pain, constipation, diarrhea, heartburn, nausea and vomiting.  Genitourinary:  Negative for dysuria, hematuria and urgency.  Musculoskeletal:  Negative for joint pain and myalgias.  Skin:  Negative for itching and rash.  Neurological:  Negative for dizziness, tingling, weakness and headaches.  Endo/Heme/Allergies:  Negative for environmental allergies. Does not bruise/bleed easily.  Psychiatric/Behavioral:  Negative for depression. The patient is not nervous/anxious and does not have insomnia.   All other systems reviewed and are negative.    Objective:  Physical Exam Vitals reviewed.  Constitutional:      General: She is not in acute distress.    Appearance: She is well-developed.  HENT:     Head: Normocephalic and atraumatic.  Eyes:     General: No scleral icterus.    Conjunctiva/sclera: Conjunctivae normal.     Pupils: Pupils are equal, round, and reactive to light.  Neck:     Vascular: No JVD.     Trachea: No tracheal deviation.  Cardiovascular:     Rate and Rhythm: Normal rate and regular rhythm.     Heart sounds: Normal heart sounds. No murmur heard. Pulmonary:     Effort: Pulmonary effort is normal. No tachypnea, accessory muscle usage or respiratory distress.     Breath sounds: No stridor. No  wheezing, rhonchi or rales.  Abdominal:     General: There is no distension.     Palpations: Abdomen is soft.     Tenderness: There is no abdominal tenderness.  Musculoskeletal:        General: No tenderness.     Cervical back: Neck supple.  Lymphadenopathy:     Cervical: No cervical adenopathy.  Skin:    General: Skin is warm and dry.     Capillary Refill: Capillary refill takes less than 2 seconds.     Findings: No rash.  Neurological:     Mental Status: She is alert and oriented to person, place, and time.  Psychiatric:        Behavior: Behavior normal.      Vitals:   08/21/23 1539  BP: 122/72  Pulse: 100  SpO2: 97%  Weight: 117 lb (53.1 kg)  Height: 4\' 11"  (1.499 m)   97% on RA BMI Readings from Last 3 Encounters:  08/21/23 23.63 kg/m  08/14/23 23.95 kg/m  06/19/23 24.46 kg/m   Wt Readings from Last 3 Encounters:  08/21/23 117 lb (53.1 kg)  08/14/23 118 lb 9.6 oz (53.8 kg)  06/19/23 121 lb 2 oz (54.9 kg)     CBC    Component Value Date/Time   WBC 8.3 08/07/2023 1334   WBC 8.0 06/12/2023 1024   RBC 4.87 08/07/2023 1334   HGB 14.2 08/07/2023 1334   HGB 13.2 10/30/2017 1031   HCT 43.5 08/07/2023 1334   HCT 40.2 10/30/2017 1031   PLT 287 08/07/2023 1334   PLT 306 10/30/2017 1031   MCV 89.3 08/07/2023 1334   MCV 86.1 10/30/2017 1031   MCH 29.2 08/07/2023 1334   MCHC 32.6 08/07/2023 1334   RDW 13.7 08/07/2023 1334   RDW 14.5 10/30/2017 1031   LYMPHSABS 2.1 08/07/2023 1334   LYMPHSABS 1.3 10/30/2017 1031   MONOABS 0.5 08/07/2023 1334   MONOABS 0.5 10/30/2017 1031   EOSABS 0.0 08/07/2023 1334   EOSABS 0.1 10/30/2017 1031   BASOSABS 0.0 08/07/2023 1334   BASOSABS 0.0 10/30/2017 1031     Chest Imaging:  September 2024 CT chest: Interval increase in consolidations and groundglass opacities.  Concerning for local recurrence as well as increased size of subsolid nodules that are located in the right upper lobe concerning for multifocal  adenocarcinoma. The patient's images have been independently reviewed by me.    Pulmonary Functions Testing Results:     No data to display          FeNO:   Pathology:   Echocardiogram:   Heart Catheterization:     Assessment & Plan:     ICD-10-CM   1. Lung mass  R91.8 Procedural/ Surgical Case Request: ROBOTIC ASSISTED NAVIGATIONAL BRONCHOSCOPY    Ambulatory referral to Pulmonology    2. Primary cancer of right lower lobe of lung (HCC)  C34.31       Discussion:  This is a 62 year old female longstanding history of lung cancer has been manage for the past 14 years since diagnosis.  She has recurrence of disease currently on Tarceva.  She now has shown progression on CT imaging and pulm Vidal Schwalbe was consulted for recommendations for consideration of repeat bronchoscopy and biopsy.  Plan: Today in the office we talked but there is benefits alternatives proceed with robotic assisted navigational bronchoscopy tissue sampling. We talked about the risk of bleeding and pneumothorax. Due to availability in endoscopy room and try to get the patient in schedule with my partner Dr. Delton Coombes for next availability. Tentative bronchoscopy date will be on 09/05/2023. Patient is agreeable to proceed with this plan. Discussed today in detail with patient's daughter present as well as interpreter to help with conversation.  We appreciate PCC's help with scheduling.   Current Outpatient Medications:    albuterol (PROAIR HFA) 108 (90 Base) MCG/ACT inhaler, Inhale 1-2 puffs into the lungs every 6 (six) hours as needed for wheezing or shortness of breath., Disp: 6.7 g, Rfl: 2   chlorpheniramine-HYDROcodone (TUSSIONEX) 10-8 MG/5ML, Take 5 mLs by mouth every 12 (twelve) hours as needed for cough., Disp: 115 mL, Rfl: 0   clindamycin (CLINDAGEL) 1 % gel, Apply to affected area 2 (two) times daily., Disp: 30 g, Rfl: 0   diltiazem (TIADYLT ER) 120 MG 24  hr capsule, Take 1 capsule by mouth daily., Disp:  90 capsule, Rfl: 2   erlotinib (TARCEVA) 150 MG tablet, Take 1 tablet (150 mg total) by mouth daily. Take on an empty stomach at least 1 hour before or 2 hours after food., Disp: 30 tablet, Rfl: 10   Josephine Igo, DO [facility] 08/21/2023 4:28 PM

## 2023-08-24 ENCOUNTER — Encounter: Payer: Self-pay | Admitting: Internal Medicine

## 2023-08-29 ENCOUNTER — Other Ambulatory Visit: Payer: Self-pay

## 2023-08-29 NOTE — Progress Notes (Signed)
Specialty Pharmacy Refill Coordination Note  [name] is a 62 y.o. female contacted today regarding refills of specialty medication(s) Erlotinib Hcl .  Patient requested Delivery  on 09/04/23  to verified address [address] [place] [address]   Medication will be filled on 09/01/23.

## 2023-09-01 ENCOUNTER — Other Ambulatory Visit: Payer: Self-pay

## 2023-09-04 ENCOUNTER — Other Ambulatory Visit: Payer: Self-pay

## 2023-09-04 ENCOUNTER — Encounter (HOSPITAL_COMMUNITY): Payer: Self-pay | Admitting: Emergency Medicine

## 2023-09-04 NOTE — Progress Notes (Signed)
Anesthesia Chart Review: Same day workup  62 year old female follows with EP cardiology for history of SVT/ectopic atrial tachycardia and sinus node dysfunction.  Last seen by Dr. Graciela Husbands 03/21/2023.  Per note, "Sinus pauses noted frequently.  Has been seen on the monitor.  Not appear to be posttermination.  Suspect that this is sinus given the P wave morphology compared to 1/23 ECG. Palpitations are intermittent.  Will leave the medications the way they are, either uptitration of a calcium blocker or the addition of a 1C might aggravate her sinus node dysfunction which at this point is not causing her any problems."  71-month follow-up recommended.  Follows with oncology for history of lung cancer s/p right lower lobectomy 2010, radiation therapy to recurrent lung mass completed 2012, chemo completed 2012, maintained on Tarceva started 2013 to present.  Recent imaging showed increased consolidation opacity in the posttreatment radiation.  Concerning for low-grade adenocarcinoma.  She was referred back to pulmonology for consideration of bronchoscopy and biopsy.  CMP and CBC from 08/07/2023 reviewed, unremarkable.  EKG 03/18/2023: Sinus rhythm with recurrent sinus pauses.  Rate 72.  CT chest 08/07/2023: IMPRESSION: 1. Interval increased consolidations and ground-glass opacities adjacent to the posttreatment area, despite lack of significant FDG uptake on prior PET, findings are highly concerning for local recurrence (likely low-grade adenocarcinoma) given progression over multiple prior exams. Consider further evaluation with tissue sampling. 2. Increased size of subsolid nodules of the right upper lobe, concerning for multifocal adenocarcinoma. 3. Stable trace right pleural effusion and pleural thickening. 4. Coronary artery calcifications and aortic Atherosclerosis (ICD10-I70.0).    Cardiac event monitor November 2023: Duration: 2d   Findings HR  avg 78  Min 43-Max 123  PVCs Rare, less than  1%   PACs freq SVT Nonsustained 834 episodes; fastest 211 bpm for 7 beats; longest for 44  secs at 92 bpm ; also freq tachycardia with 2:1 block w atrial rates >200 BPM  Pauses  longest 3.8 sec POST TERMINATION Symptoms:None reported   Conclusions: Freq atrial ectopy and post termination pauses Will need AA drug or catheter ablation   Recommendations Followup in office to discuss the above  TTE 05/22/2017: - Left ventricle: The cavity size was normal. Wall thickness was    increased in a pattern of mild LVH. Systolic function was normal.    The estimated ejection fraction was in the range of 60% to 65%.    Wall motion was normal; there were no regional wall motion    abnormalities.  - Aortic valve: Bulky calcification of the medial mitral annulus    and intervalvualr fibrosa extending into base of aortic valve  - Atrial septum: No defect or patent foramen ovale was identified.    [facility]/Anesthesiology Phone [phone] 09/04/2023 3:37 PM

## 2023-09-04 NOTE — Progress Notes (Signed)
SDW CALL  Patient was given pre-op instructions over the phone. The opportunity was given for the patient to ask questions. No further questions asked. Patient verbalized understanding of instructions given.   PCP - [facility] [place],MD Cardiologist - Sherryl Manges ,MD  PPM/ICD - denies Device Orders -  Rep Notified -   Chest x-ray - 08/14/23-CT chest EKG - 03/21/23 Stress Test - none ECHO - 05/22/17 Cardiac Cath - none  Sleep Study - none CPAP -   Fasting Blood Sugar - na Checks Blood Sugar _____ times a day  Blood Thinner Instructions:na Aspirin Instructions:na  ERAS Protcol -no PRE-SURGERY Ensure or G2-   COVID TEST- na   Anesthesia review: no  Patient denies shortness of breath, fever, cough and chest pain over the phone call    Surgical Instructions    Your procedure is scheduled on October 8  Report to [facility] Main Entrance "A" at 0530 A.M., then check in with the Admitting office.  Call this number if you have problems the morning of surgery:  [phone]    Remember:  Do not eat or after midnight the night before your surgery   Take these medicines the morning of surgery with A SIP OF WATER:      As of today, STOP taking any Aspirin (unless otherwise instructed by your surgeon) Aleve, Naproxen, Ibuprofen, Motrin, Advil, Goody's, BC's, all herbal medications, fish oil, and all vitamins.  [facility] is not responsible for any belongings or valuables. .   Do NOT Smoke (Tobacco/Vaping)  24 hours prior to your procedure  If you use a CPAP at night, you may bring your mask for your overnight stay.   Contacts, glasses, hearing aids, dentures or partials may not be worn into surgery, please bring cases for these belongings   Patients discharged the day of surgery will not be allowed to drive home, and someone needs to stay with them for 24 hours.   Special instructions:    Oral Hygiene is also important to reduce your risk of infection.   Remember - BRUSH YOUR TEETH THE MORNING OF SURGERY WITH YOUR REGULAR TOOTHPASTE   Day of Surgery:  Take a shower the day of or night before with antibacterial soap. Wear Clean/Comfortable clothing the morning of surgery Do not apply any deodorants/lotions.   Do not wear jewelry or makeup Do not wear lotions, powders, perfumes/colognes, or deodorant. Do not shave 48 hours prior to surgery.  Men may shave face and neck. Do not bring valuables to the hospital. Do not wear nail polish, gel polish, artificial nails, or any other type of covering on natural nails (fingers and toes) If you have artificial nails or gel coating that need to be removed by a nail salon, please have this removed prior to surgery. Artificial nails or gel coating may interfere with anesthesia's ability to adequately monitor your vital signs. Remember to brush your teeth WITH YOUR REGULAR TOOTHPASTE.

## 2023-09-04 NOTE — Anesthesia Preprocedure Evaluation (Addendum)
Anesthesia Evaluation  Patient identified by MRN, date of birth, ID band Patient awake    Reviewed: Allergy & Precautions, NPO status , Patient's Chart, lab work & pertinent test results  History of Anesthesia Complications Negative for: history of anesthetic complications  Airway Mallampati: II  TM Distance: >3 FB Neck ROM: Full    Dental  (+) Missing,    Pulmonary  lung nodule, consolidation   Pulmonary exam normal        Cardiovascular hypertension, Pt. on medications Normal cardiovascular exam     Neuro/Psych negative neurological ROS     GI/Hepatic negative GI ROS, Neg liver ROS,,,  Endo/Other  negative endocrine ROS    Renal/GU negative Renal ROS     Musculoskeletal negative musculoskeletal ROS (+)    Abdominal   Peds  Hematology negative hematology ROS (+)   Anesthesia Other Findings Day of surgery medications reviewed with patient.  Reproductive/Obstetrics                              Anesthesia Physical Anesthesia Plan  ASA: 3  Anesthesia Plan: General   Post-op Pain Management: Minimal or no pain anticipated   Induction: Intravenous  PONV Risk Score and Plan: 3 and Treatment may vary due to age or medical condition, Ondansetron, Dexamethasone and Midazolam  Airway Management Planned: Oral ETT  Additional Equipment: None  Intra-op Plan:   Post-operative Plan: Extubation in OR  Informed Consent: I have reviewed the patients History and Physical, chart, labs and discussed the procedure including the risks, benefits and alternatives for the proposed anesthesia with the patient or authorized representative who has indicated his/her understanding and acceptance.     Dental advisory given and Interpreter used for interveiw  Plan Discussed with: CRNA  Anesthesia Plan Comments: (PAT note by Antionette Poles, PA-C: 62 year old female follows with EP cardiology for  history of SVT/ectopic atrial tachycardia and sinus node dysfunction.  Last seen by Dr. Graciela Husbands 03/21/2023.  Per note, "Sinus pauses noted frequently.  Has been seen on the monitor.  Not appear to be posttermination.  Suspect that this is sinus given the P wave morphology compared to 1/23 ECG. Palpitations are intermittent.  Will leave the medications the way they are, either uptitration of a calcium blocker or the addition of a 1C might aggravate her sinus node dysfunction which at this point is not causing her any problems."  45-month follow-up recommended.  Follows with oncology for history of lung cancer s/p right lower lobectomy 2010, radiation therapy to recurrent lung mass completed 2012, chemo completed 2012, maintained on Tarceva started 2013 to present.  Recent imaging showed increased consolidation opacity in the posttreatment radiation.  Concerning for low-grade adenocarcinoma.  She was referred back to pulmonology for consideration of bronchoscopy and biopsy.  CMP and CBC from 08/07/2023 reviewed, unremarkable.  EKG 03/18/2023: Sinus rhythm with recurrent sinus pauses.  Rate 72.  CT chest 08/07/2023: IMPRESSION: 1. Interval increased consolidations and ground-glass opacities adjacent to the posttreatment area, despite lack of significant FDG uptake on prior PET, findings are highly concerning for local recurrence (likely low-grade adenocarcinoma) given progression over multiple prior exams. Consider further evaluation with tissue sampling. 2. Increased size of subsolid nodules of the right upper lobe, concerning for multifocal adenocarcinoma. 3. Stable trace right pleural effusion and pleural thickening. 4. Coronary artery calcifications and aortic Atherosclerosis (ICD10-I70.0).    Cardiac event monitor November 2023: Duration: 2d   Findings HR  avg  78  Min 43-Max 123  PVCs Rare, less than 1%   PACs freq SVT Nonsustained 834 episodes; fastest 211 bpm for 7 beats; longest for 44   secs at 92 bpm ; also freq tachycardia with 2:1 block w atrial rates >200 BPM  Pauses  longest 3.8 sec POST TERMINATION Symptoms:None reported   Conclusions: Freq atrial ectopy and post termination pauses Will need AA drug or catheter ablation   Recommendations Followup in office to discuss the above  TTE 05/22/2017: - Left ventricle: The cavity size was normal. Wall thickness was    increased in a pattern of mild LVH. Systolic function was normal.    The estimated ejection fraction was in the range of 60% to 65%.    Wall motion was normal; there were no regional wall motion    abnormalities.  - Aortic valve: Bulky calcification of the medial mitral annulus    and intervalvualr fibrosa extending into base of aortic valve  - Atrial septum: No defect or patent foramen ovale was identified.   )         Anesthesia Quick Evaluation

## 2023-09-05 ENCOUNTER — Ambulatory Visit (HOSPITAL_COMMUNITY)
Admission: RE | Admit: 2023-09-05 | Discharge: 2023-09-05 | Disposition: A | Discharge status: Home / Self Care | Payer: Medicare Other | Attending: Emergency Medicine | Admitting: Emergency Medicine

## 2023-09-05 ENCOUNTER — Ambulatory Visit (HOSPITAL_COMMUNITY): Payer: Medicare Other

## 2023-09-05 ENCOUNTER — Encounter (HOSPITAL_COMMUNITY)
Admission: RE | Disposition: A | Discharge status: Home / Self Care | Payer: Self-pay | Source: Home / Self Care | Attending: Emergency Medicine

## 2023-09-05 ENCOUNTER — Ambulatory Visit (HOSPITAL_COMMUNITY): Payer: Medicare Other | Admitting: Anesthesiology

## 2023-09-05 ENCOUNTER — Ambulatory Visit (HOSPITAL_BASED_OUTPATIENT_CLINIC_OR_DEPARTMENT_OTHER): Payer: Medicare Other | Admitting: Anesthesiology

## 2023-09-05 DIAGNOSIS — R918 Other nonspecific abnormal finding of lung field: Secondary | ICD-10-CM | POA: Diagnosis present

## 2023-09-05 DIAGNOSIS — C3411 Malignant neoplasm of upper lobe, right bronchus or lung: Secondary | ICD-10-CM | POA: Diagnosis not present

## 2023-09-05 DIAGNOSIS — R911 Solitary pulmonary nodule: Secondary | ICD-10-CM

## 2023-09-05 DIAGNOSIS — I10 Essential (primary) hypertension: Secondary | ICD-10-CM

## 2023-09-05 DIAGNOSIS — Z7969 Long term (current) use of other immunomodulators and immunosuppressants: Secondary | ICD-10-CM | POA: Insufficient documentation

## 2023-09-05 DIAGNOSIS — R140 Abdominal distension (gaseous): Secondary | ICD-10-CM | POA: Diagnosis not present

## 2023-09-05 DIAGNOSIS — Z902 Acquired absence of lung [part of]: Secondary | ICD-10-CM | POA: Diagnosis not present

## 2023-09-05 DIAGNOSIS — C3431 Malignant neoplasm of lower lobe, right bronchus or lung: Secondary | ICD-10-CM | POA: Insufficient documentation

## 2023-09-05 DIAGNOSIS — Z48813 Encounter for surgical aftercare following surgery on the respiratory system: Secondary | ICD-10-CM | POA: Diagnosis not present

## 2023-09-05 DIAGNOSIS — Z923 Personal history of irradiation: Secondary | ICD-10-CM | POA: Insufficient documentation

## 2023-09-05 DIAGNOSIS — C3490 Malignant neoplasm of unspecified part of unspecified bronchus or lung: Secondary | ICD-10-CM | POA: Diagnosis not present

## 2023-09-05 HISTORY — PX: FIDUCIAL MARKER PLACEMENT: SHX6858

## 2023-09-05 HISTORY — PX: BRONCHIAL BRUSHINGS: SHX5108

## 2023-09-05 HISTORY — PX: BRONCHIAL BIOPSY: SHX5109

## 2023-09-05 HISTORY — PX: BRONCHIAL NEEDLE ASPIRATION BIOPSY: SHX5106

## 2023-09-05 SURGERY — BRONCHOSCOPY, WITH BIOPSY USING ELECTROMAGNETIC NAVIGATION
Anesthesia: General | Laterality: Bilateral

## 2023-09-05 MED ORDER — DEXAMETHASONE SODIUM PHOSPHATE 10 MG/ML IJ SOLN
INTRAMUSCULAR | Status: DC | PRN
  Administered 2023-09-05: 10 mg via INTRAVENOUS

## 2023-09-05 MED ORDER — CHLORHEXIDINE GLUCONATE 0.12 % MT SOLN
OROMUCOSAL | Status: AC
  Administered 2023-09-05: 15 mL via OROMUCOSAL
  Filled 2023-09-05: qty 15

## 2023-09-05 MED ORDER — LIDOCAINE 2% (20 MG/ML) 5 ML SYRINGE
INTRAMUSCULAR | Status: DC | PRN
  Administered 2023-09-05: 80 mg via INTRAVENOUS

## 2023-09-05 MED ORDER — ROCURONIUM BROMIDE 10 MG/ML (PF) SYRINGE
PREFILLED_SYRINGE | INTRAVENOUS | Status: DC | PRN
  Administered 2023-09-05: 10 mg via INTRAVENOUS
  Administered 2023-09-05: 40 mg via INTRAVENOUS

## 2023-09-05 MED ORDER — DROPERIDOL 2.5 MG/ML IJ SOLN
0.6250 mg | Freq: Once | INTRAMUSCULAR | Status: AC | PRN
  Administered 2023-09-05: 0.625 mg via INTRAVENOUS

## 2023-09-05 MED ORDER — SUGAMMADEX SODIUM 200 MG/2ML IV SOLN
INTRAVENOUS | Status: DC | PRN
  Administered 2023-09-05: 200 mg via INTRAVENOUS

## 2023-09-05 MED ORDER — PROPOFOL 10 MG/ML IV BOLUS
INTRAVENOUS | Status: DC | PRN
  Administered 2023-09-05: 100 mg via INTRAVENOUS

## 2023-09-05 MED ORDER — MIDAZOLAM HCL 2 MG/2ML IJ SOLN
INTRAMUSCULAR | Status: DC | PRN
  Administered 2023-09-05: 2 mg via INTRAVENOUS

## 2023-09-05 MED ORDER — ONDANSETRON HCL 4 MG/2ML IJ SOLN
INTRAMUSCULAR | Status: DC | PRN
  Administered 2023-09-05: 4 mg via INTRAVENOUS

## 2023-09-05 MED ORDER — ACETAMINOPHEN 500 MG PO TABS
ORAL_TABLET | ORAL | Status: AC
  Filled 2023-09-05: qty 2

## 2023-09-05 MED ORDER — PHENYLEPHRINE 80 MCG/ML (10ML) SYRINGE FOR IV PUSH (FOR BLOOD PRESSURE SUPPORT)
PREFILLED_SYRINGE | INTRAVENOUS | Status: DC | PRN
  Administered 2023-09-05: 80 ug via INTRAVENOUS
  Administered 2023-09-05: 160 ug via INTRAVENOUS
  Administered 2023-09-05: 80 ug via INTRAVENOUS

## 2023-09-05 MED ORDER — MIDAZOLAM HCL 2 MG/2ML IJ SOLN
INTRAMUSCULAR | Status: AC
  Filled 2023-09-05: qty 2

## 2023-09-05 MED ORDER — GLYCOPYRROLATE 0.2 MG/ML IJ SOLN
INTRAMUSCULAR | Status: DC | PRN
Start: 2023-09-05 — End: 2023-09-05
  Administered 2023-09-05: .1 mg via INTRAVENOUS

## 2023-09-05 MED ORDER — PHENYLEPHRINE HCL-NACL 20-0.9 MG/250ML-% IV SOLN
INTRAVENOUS | Status: DC | PRN
  Administered 2023-09-05: 50 ug/min via INTRAVENOUS

## 2023-09-05 MED ORDER — FENTANYL CITRATE (PF) 100 MCG/2ML IJ SOLN: 25.0000 ug | INTRAMUSCULAR | Status: DC | PRN

## 2023-09-05 MED ORDER — LACTATED RINGERS IV SOLN: INTRAVENOUS | Status: DC

## 2023-09-05 MED ORDER — LACTATED RINGERS IV SOLN
INTRAVENOUS | Status: DC | PRN
Start: 2023-09-05 — End: 2023-09-05

## 2023-09-05 MED ORDER — CHLORHEXIDINE GLUCONATE 0.12 % MT SOLN: 15.0000 mL | Freq: Once | OROMUCOSAL | Status: AC

## 2023-09-05 SURGICAL SUPPLY — 1 items: superlock fiducial marker IMPLANT

## 2023-09-05 NOTE — Interval H&P Note (Signed)
History and Physical Interval Note:  09/05/2023 7:25 AM  [name]  has presented today for surgery, with the diagnosis of lung nodule, consolidation.  The various methods of treatment have been discussed with the patient and family. After consideration of risks, benefits and other options for treatment, the patient has consented to  Procedure(s): ROBOTIC ASSISTED NAVIGATIONAL BRONCHOSCOPY (Bilateral) as a surgical intervention.  The patient's history has been reviewed, patient examined, no change in status, stable for surgery.  I have reviewed the patient's chart and labs.  Questions were answered to the patient's satisfaction.     Leslye Peer

## 2023-09-05 NOTE — Discharge Instructions (Addendum)
Flexible Bronchoscopy, Care After This sheet gives you information about how to care for yourself after your test. Your doctor may also give you more specific instructions. If you have problems or questions, contact your doctor. Follow these instructions at home: Eating and drinking When your numbness is gone and your cough and gag reflexes have come back, you may: Eat only soft foods. Slowly drink liquids. The day after the test, go back to your normal diet. Driving Do not drive for 24 hours if you were given a medicine to help you relax (sedative). Do not drive or use heavy machinery while taking prescription pain medicine. General instructions  Take over-the-counter and prescription medicines only as told by your doctor. Return to your normal activities as told. Ask what activities are safe for you. Do not use any products that have nicotine or tobacco in them. This includes cigarettes and e-cigarettes. If you need help quitting, ask your doctor. Keep all follow-up visits as told by your doctor. This is important. It is very important if you had a tissue sample (biopsy) taken. Get help right away if: You have shortness of breath that gets worse. You get light-headed. You feel like you are going to pass out (faint). You have chest pain. You cough up: More than a little blood. More blood than before. Summary Do not eat or drink anything (not even water) for 2 hours after your test, or until your numbing medicine wears off. Do not use cigarettes. Do not use e-cigarettes. Get help right away if you have chest pain.  Please call our office for any questions or concerns.  [phone].  This information is not intended to replace advice given to you by your health care provider. Make sure you discuss any questions you have with your health care provider. Document Released: 09/11/2009 Document Revised: 10/27/2017 Document Reviewed: 12/02/2016 Elsevier Patient Education  2020 Elsevier  Inc.  

## 2023-09-05 NOTE — Op Note (Signed)
Video Bronchoscopy with Robotic Assisted Bronchoscopic Navigation   Date of Operation: 09/05/2023   Pre-op Diagnosis: Right upper lobe groundglass infiltrate, right upper lobe nodule  Post-op Diagnosis: Same  Surgeon: Jacqulynn Shappell  Assistants: None  Anesthesia: General endotracheal anesthesia  Operation: Flexible video fiberoptic bronchoscopy with robotic assistance and biopsies.  Estimated Blood Loss: Minimal  Complications: None  Indications and History: [name] is a 62 y.o. female with history of adenocarcinoma of the lung post right lower lobectomy with chemoradiation.  Surveillance imaging shows an evolving right upper lobe groundglass infiltrate adjacent to treated area as well as an evolving more discrete right upper lobe mixed density nodule.  Recommendation made to achieve a tissue diagnosis via robotic assisted navigational bronchoscopy. The risks, benefits, complications, treatment options and expected outcomes were discussed with the patient.  The possibilities of pneumothorax, pneumonia, reaction to medication, pulmonary aspiration, perforation of a viscus, bleeding, failure to diagnose a condition and creating a complication requiring transfusion or operation were discussed with the patient who freely signed the consent.    Description of Procedure: The patient was seen in the Preoperative Area, was examined and was deemed appropriate to proceed.  The patient was taken to [facility] endoscopy room 3, identified as [name] and the procedure verified as Flexible Video Fiberoptic Bronchoscopy.  A Time Out was held and the above information confirmed.   Prior to the date of the procedure a high-resolution CT scan of the chest was performed. Utilizing ION software program a virtual tracheobronchial tree was generated to allow the creation of distinct navigation pathways to the patient's parenchymal abnormalities. After being taken to the operating room general anesthesia was initiated and the  patient  was orally intubated. The video fiberoptic bronchoscope was introduced via the endotracheal tube and a general inspection was performed which showed normal left lung anatomy.  The right lower lobe airway was surgically absent.  There was a small orifice, presumably the right middle lobe airway that appeared remain patent.  The right upper lobe airways were splayed but were otherwise normal in appearance. Aspiration of the bilateral mainstems was completed to remove any remaining secretions. Robotic catheter inserted into patient's endotracheal tube.   Target #1 right upper lobe mixed density infiltrate: The distinct navigation pathways prepared prior to this procedure were then utilized to navigate to patient's lesion identified on CT scan. The robotic catheter was secured into place and the vision probe was withdrawn.  Lesion location was approximated using fluoroscopy.  Local registration and targeting was performed using Cios three-dimensional imaging. Under fluoroscopic guidance transbronchial brushings, transbronchial needle biopsies, and transbronchial forceps biopsies were performed to be sent for cytology and pathology.   Target #2 right upper lobe mixed density nodule: The distinct navigation pathways prepared prior to this procedure were then utilized to navigate to patient's lesion identified on CT scan. The robotic catheter was secured into place and the vision probe was withdrawn.  Lesion location was approximated using fluoroscopy.  Local registration and targeting was performed using Cios three-dimensional imaging. Under fluoroscopic guidance transbronchial brushings, transbronchial needle biopsies, and transbronchial forceps biopsies were performed to be sent for cytology and pathology.  Under fluoroscopic guidance a single fiducial marker was placed adjacent to the nodule.  At the end of the procedure a general airway inspection was performed and there was no evidence of active  bleeding. The bronchoscope was removed.  The patient tolerated the procedure well. There was no significant blood loss and there were no obvious complications. A  post-procedural chest x-ray is pending.  Samples Target #1: 1. Transbronchial brushings from right upper lobe mixed density infiltrate 2. Transbronchial Wang needle biopsies from right upper lobe mixed density infiltrate 3. Transbronchial forceps biopsies from right upper lobe mixed density infiltrate  Samples Target #2: 1. Transbronchial brushings from right upper lobe mixed density nodule 2. Transbronchial Wang needle biopsies from right upper lobe mixed density nodule 3. Transbronchial forceps biopsies from right upper lobe mixed density nodule   Plans:  The patient will be discharged from the PACU to home when recovered from anesthesia and after chest x-ray is reviewed. We will review the cytology, pathology and results with the patient when they become available. Outpatient followup will be with Saralyn Pilar, NP.    Levy Pupa, MD, PhD 09/05/2023, 9:00 AM [facility] [phone] or if no answer before 7:00PM call [phone] For any issues after 7:00PM please call eLink [phone]

## 2023-09-05 NOTE — Anesthesia Procedure Notes (Signed)
Procedure Name: Intubation Date/Time: 09/05/2023 7:41 AM  Performed by: Susy Manor, CRNAPre-anesthesia Checklist: Patient identified, Emergency Drugs available, Suction available and Patient being monitored Patient Re-evaluated:Patient Re-evaluated prior to induction Oxygen Delivery Method: Circle System Utilized Preoxygenation: Pre-oxygenation with 100% oxygen Induction Type: IV induction Ventilation: Mask ventilation without difficulty Laryngoscope Size: Mac and 3 Grade View: Grade I Tube type: Oral Tube size: 8.5 mm Number of attempts: 1 Airway Equipment and Method: Stylet and Oral airway Placement Confirmation: ETT inserted through vocal cords under direct vision, positive ETCO2 and breath sounds checked- equal and bilateral Secured at: 22 cm Tube secured with: Tape Dental Injury: Teeth and Oropharynx as per pre-operative assessment

## 2023-09-05 NOTE — Transfer of Care (Signed)
Immediate Anesthesia Transfer of Care Note  Patient: Pinkey Garnett  Procedure(s) Performed: ROBOTIC ASSISTED NAVIGATIONAL BRONCHOSCOPY (Bilateral) BRONCHIAL BRUSHINGS BRONCHIAL BIOPSIES BRONCHIAL NEEDLE ASPIRATION BIOPSIES FIDUCIAL MARKER PLACEMENT  Patient Location: PACU  Anesthesia Type:General  Level of Consciousness: awake and alert   Airway & Oxygen Therapy: Patient Spontanous Breathing  Post-op Assessment: Report given to RN and Post -op Vital signs reviewed and stable  Post vital signs: Reviewed and stable  Last Vitals:  Vitals Value Taken Time  BP 115/59 09/05/23 0907  Temp 36.6 C 09/05/23 0907  Pulse 55 09/05/23 0910  Resp 30 09/05/23 0910  SpO2 92 % 09/05/23 0910  Vitals shown include unfiled device data.  Last Pain:  Vitals:   09/05/23 0644  TempSrc:   PainSc: 0-No pain         Complications: No notable events documented.

## 2023-09-05 NOTE — Anesthesia Postprocedure Evaluation (Signed)
Anesthesia Post Note  Patient: [name]  Procedure(s) Performed: ROBOTIC ASSISTED NAVIGATIONAL BRONCHOSCOPY (Bilateral) BRONCHIAL BRUSHINGS BRONCHIAL BIOPSIES BRONCHIAL NEEDLE ASPIRATION BIOPSIES FIDUCIAL MARKER PLACEMENT     Patient location during evaluation: PACU Anesthesia Type: General Level of consciousness: awake and alert Pain management: pain level controlled Vital Signs Assessment: post-procedure vital signs reviewed and stable Respiratory status: spontaneous breathing, nonlabored ventilation and respiratory function stable Cardiovascular status: blood pressure returned to baseline Postop Assessment: no apparent nausea or vomiting Anesthetic complications: no   No notable events documented.  Last Vitals:  Vitals:   09/05/23 0945 09/05/23 0953  BP: (!) 93/57 (!) 96/55  Pulse: (!) 55 (!) 53  Resp: 17 20  Temp:  36.6 C  SpO2: 100% 100%    Last Pain:  Vitals:   09/05/23 0930  TempSrc:   PainSc: 0-No pain                 Shanda Howells

## 2023-09-06 LAB — CYTOLOGY - NON PAP

## 2023-09-08 ENCOUNTER — Encounter (HOSPITAL_COMMUNITY): Payer: Self-pay | Admitting: Emergency Medicine

## 2023-09-12 ENCOUNTER — Encounter: Payer: Self-pay | Admitting: Acute Care

## 2023-09-12 ENCOUNTER — Ambulatory Visit (INDEPENDENT_AMBULATORY_CARE_PROVIDER_SITE_OTHER): Payer: Medicare Other | Admitting: Acute Care

## 2023-09-12 VITALS — BP 132/87 | HR 92 | Temp 98.1°F | Ht <= 58 in | Wt 118.0 lb

## 2023-09-12 DIAGNOSIS — C3490 Malignant neoplasm of unspecified part of unspecified bronchus or lung: Secondary | ICD-10-CM

## 2023-09-12 DIAGNOSIS — R1310 Dysphagia, unspecified: Secondary | ICD-10-CM | POA: Diagnosis not present

## 2023-09-12 DIAGNOSIS — R9389 Abnormal findings on diagnostic imaging of other specified body structures: Secondary | ICD-10-CM

## 2023-09-12 DIAGNOSIS — Z23 Encounter for immunization: Secondary | ICD-10-CM

## 2023-09-12 NOTE — Patient Instructions (Addendum)
It is good to see you today. I am glad you have done well after your procedure. You biopsy result was positive for adenocarcinoma of the Right Upper Lobe This is the same type of cancer that you had previously. You have follow up with Dr. Arbutus Ped 10/21, and he will determine best options for treatment.  His team will continue to take great care of you.  Your PET scan did show some activity of your thyroid. I have ordered an Ultra Sound of your Thyroid. You will get a call to get this scheduled.  Once the results are evaluated, we will determine if you need a biopsy of the thyroid . I have referred you to a gastro-intestinal doctor to  evaluate your difficulty swallowing. We will give you your flu shot today.  Daisey Must with your treatment. Call if you need [place] in the future.  Please contact office for sooner follow up if symptoms do not improve or worsen or seek emergency care

## 2023-09-12 NOTE — Progress Notes (Signed)
History of Present Illness [name] is a 62 y.o. female never smoker with PMH of recurrent non-small cell lung cancer, adenocarcinoma, status post surgical resection followed by systemic chemotherapy with carboplatin and Alimta . She was referred to Dr. Tonia [name] 07/2023 by Dr. Arbutus [name] for consideration of for repeat bronchoscopy and biopsy.   Synopsis past medical history of right lower lobe adenocarcinoma treated with chemo targeted therapy and radiation therapy. Patient was diagnosed initially in March 2010 with a stage Ib, T2a N0 M0 disease. She was treated with a right lower lobectomy by Dr. Lavinia [name]. She had recurrence of disease started on systemic chemotherapy. She is currently being managed with Tarceva. She has been tolerating treatment with Tarceva for several years. She is now status post 124 months. Repeat CT imaging shows no evidence of hypermetabolic disease with the right lobe pulmonary nodule or evidence of recurrence. However she does show increased consolidation opacity in the post treatment radiation area concerning for low-grade adenocarcinoma. She was referred back to pulmonary for consideration of bronchoscopy and biopsy. She was seen by Dr. Tonia [name] in the office . She underwent the procedure 09/05/2023 by Dr. Delton [name].   PRIOR THERAPY:  Status post right lower lobectomy under the care of Dr. Laneta [name] on 04/08/2009. Status post palliative radiotherapy to the right lower lobe recurrent lung mass under the care of Dr. Mitzi [name], completed on June 08, 2011. Systemic chemotherapy with carboplatin for AUC of 5 and Alimta 500 mg/M2 every 3 weeks. She is status post 3 cycles.   CURRENT THERAPY: Tarceva 150 mg by mouth daily, therapy beginning 07/24/2012. Status post approximately 124 months of therapy.    09/12/2023 Pt. Presents for follow up after bronchoscopy 09/05/2023. She is here with 2 daughters and an interpreter.   She states she felt good the day after the biopsy , but the next day she has  some shortness of breath and some tingling in her hand. Shortness of breath is better and the tingling in her hand is getting better. No fever, she did have some scant bleeding,  We have reviewed her cytology results. They were positive for adenocarcinoma in the right upper lobe. I have reviewed the PET scan results and we have discussed that we need to get a thyroid US as there was notation of  Intense metabolic activity associated with a nodule within the RIGHT lobe of thyroid gland. Based on [place] results, we will determine if she needs biopsy. I have messaged the oncology RN navigator so she and Dr. Arbutus [name] are aware. She does have an appointment with Dr. Arbutus [name] 09/18/2023.  She states she has lost about 5 pounds since her biopsy. She has difficulty swallowing , she states since her radiation treatments. We will refer to GI for evaluation.   Test Results: Cytology 09/05/2023  A. LUNG, RUL TARGET 1, BRUSH:  - No malignant cells identified  - Benign bronchial cells, macrophages, and blood   B. LUNG, RUL TARGET 1, FINE NEEDLE ASPIRATION  BIOPSY:  - No malignant cells identified  - Benign bronchial cells, macrophages, and blood   C. LUNG, RUL TARGET 2, BRUSHING:  - Atypical cells present   D. LUNG, RUL TARGET 2, FINE NEEDLE ASPIRATION  BIOPSY:  - Adenocarcinoma  - See comment.     Latest Ref Rng & Units 08/07/2023    1:34 PM 06/12/2023   10:24 AM 03/31/2023    9:30 AM  CBC  WBC 4.0 - 10.5 K/uL 8.3  8.0  7.4   Hemoglobin  12.0 - 15.0 g/dL 96.0  45.4  09.8   Hematocrit 36.0 - 46.0 % 43.5  42.2  42.8   Platelets 150 - 400 K/uL 287  261  273        Latest Ref Rng & Units 08/07/2023    1:34 PM 06/12/2023   10:24 AM 03/31/2023    9:30 AM  BMP  Glucose 70 - 99 mg/dL 85  78  119   BUN 8 - 23 mg/dL 14  9  11    Creatinine 0.44 - 1.00 mg/dL 1.47  8.29  5.62   Sodium 135 - 145 mmol/L 140  142  140   Potassium 3.5 - 5.1 mmol/L 3.9  3.5  4.0   Chloride 98 - 111 mmol/L 104  106  105   CO2 22 - 32  mmol/L 28  31  30    Calcium 8.9 - 10.3 mg/dL 9.5  9.2  8.8     BNP    Component Value Date/Time   BNP 30.2 12/31/2016 1647    ProBNP No results found for: "PROBNP"  PFT No results found for: "FEV1PRE", "FEV1POST", "FVCPRE", "FVCPOST", "TLC", "DLCOUNC", "PREFEV1FVCRT", "PSTFEV1FVCRT"  DG Chest Port 1 View  Result Date: 09/05/2023 CLINICAL DATA:  62 year old female status post bronchoscopy and bronchoscopic biopsy. Previous right lower lobectomy, lung adenocarcinoma. EXAM: PORTABLE CHEST 1 VIEW COMPARISON:  CT chest 08/07/2023 and earlier. FINDINGS: Portable AP upright view at 0930 hours. Stable left chest power port. Stable cardiac size and mediastinal contours. Visualized tracheal air column is within normal limits. Nodular 1.5 cm peribronchial opacity inferior to the right hilum with a small bronchoscopic clip or marker adjacent. Otherwise stable right lung base hypo ventilation. No pneumothorax. Left lung appears stable and negative. No acute osseous abnormality identified.  Paucity of bowel gas. IMPRESSION: 1. Post bronchoscopy, post biopsy changes inferior to the right hilum with small metallic clip or marker. No pneumothorax or other adverse features. 2. Otherwise stable chest including right lung base opacification. Electronically Signed   By: Odessa Fleming M.D.   On: 09/05/2023 12:02   DG C-ARM BRONCHOSCOPY  Result Date: 09/05/2023 C-ARM BRONCHOSCOPY: Fluoroscopy was utilized by the requesting physician.  No radiographic interpretation.   DG C-Arm 1-60 Min-No Report  Result Date: 09/05/2023 Fluoroscopy was utilized by the requesting physician.  No radiographic interpretation.     Past medical hx Past Medical History:  Diagnosis Date   Drug-induced skin rash 02/27/2017   Encounter for antineoplastic chemotherapy 01/19/2016   History of radiation therapy 05/02/11 to 06/08/11   right lung   Lung cancer (HCC)    right lower lobe adenocarcinoma     Social History   Tobacco Use    Smoking status: Never   Smokeless tobacco: Never  Vaping Use   Vaping status: Never Used  Substance Use Topics   Alcohol use: No   Drug use: No    Ms.Deemer reports that she has never smoked. She has never used smokeless tobacco. She reports that she does not drink alcohol and does not use drugs.  Tobacco Cessation: Never smoker    Past surgical hx, Family hx, Social hx all reviewed.  Current Outpatient Medications on File Prior to Visit  Medication Sig   albuterol (PROAIR HFA) 108 (90 Base) MCG/ACT inhaler Inhale 1-2 puffs into the lungs every 6 (six) hours as needed for wheezing or shortness of breath.   chlorpheniramine-HYDROcodone (TUSSIONEX) 10-8 MG/5ML Take 5 mLs by mouth every 12 (twelve) hours as needed for cough.  clindamycin (CLINDAGEL) 1 % gel Apply to affected area 2 (two) times daily.   diltiazem (TIADYLT ER) 120 MG 24 hr capsule Take 1 capsule by mouth daily.   erlotinib (TARCEVA) 150 MG tablet Take 1 tablet (150 mg total) by mouth daily. Take on an empty stomach at least 1 hour before or 2 hours after food.   No current facility-administered medications on file prior to visit.     Allergies  Allergen Reactions   Codeine Nausea Only   Doxycycline Nausea And Vomiting    vomiting and headache    Review Of Systems:  Constitutional:   +  weight loss, night sweats,  Fevers, chills, fatigue, or  lassitude.  HEENT:   No headaches, + Difficulty swallowing,  No Tooth/dental problems, or  Sore throat,                No sneezing, itching, ear ache, nasal congestion, post nasal drip,   CV:  No chest pain,  Orthopnea, PND, swelling in lower extremities, anasarca, dizziness, palpitations, syncope.   GI  No heartburn, indigestion, abdominal pain, nausea, vomiting, diarrhea, change in bowel habits, loss of appetite, bloody stools.   Resp: No shortness of breath with exertion or at rest.  No excess mucus, no productive cough,  No non-productive cough,  No coughing up of  blood.  No change in color of mucus.  No wheezing.  No chest wall deformity  Skin: no rash or lesions.  GU: no dysuria, change in color of urine, no urgency or frequency.  No flank pain, no hematuria   MS:  No joint pain or swelling.  No decreased range of motion.  No back pain.  Psych:  No change in mood or affect. No depression or anxiety.  No memory loss.   Vital Signs BP 132/87 (BP Location: Left Arm, Cuff Size: Normal)   Pulse 92   Temp 98.1 F (36.7 C) (Oral)   Ht 4' (1.219 m)   Wt 118 lb (53.5 kg)   LMP 02/10/2012   SpO2 99% Comment: on RA  BMI 36.01 kg/m    Physical Exam:  General- No distress,  A&Ox3, pleasant  ENT: No sinus tenderness, TM clear, pale nasal mucosa, no oral exudate,no post nasal drip, no LAN Cardiac: S1, S2, regular rate and rhythm, no murmur Chest: No wheeze/ rales/ dullness; no accessory muscle use, no nasal flaring, no sternal retractions, diminished Right base Abd.: Soft Non-tender, ND, BS +, Body mass index is 36.01 kg/m.  Ext: No clubbing cyanosis, edema Neuro:  normal strength, MAE x 4, A&O x 3 Skin: No rashes, warm and dry, no lesions  Psych: normal mood and behavior   Assessment/Plan Recurrent Adenocarcinoma of the RUL Never smoker  Plan I am glad you have done well after your procedure. You biopsy result was positive for adenocarcinoma of the Right Upper Lobe This is the same type of cancer that you had previously. You have follow up with Dr. Arbutus [name] 10/21, and he will determine best options for treatment.  His team will continue to take great care of you.  Your PET scan did show some activity of your thyroid. I have ordered an Ultra Sound of your Thyroid. You will get a call to get this scheduled.  Once the results are evaluated, we will determine if you need a biopsy of the thyroid . I have referred you to a gastro-intestinal doctor to  evaluate your difficulty swallowing. We will give you your flu shot today.  Daisey Must with  your treatment. Call if you need [place] in the future.  Please contact office for sooner follow up if symptoms do not improve or worsen or seek emergency care    I spent 35 minutes dedicated to the care of this patient on the date of this encounter to include pre-visit review of records, face-to-face time with the patient discussing conditions above, post visit ordering of testing, clinical documentation with the electronic health record, making appropriate referrals as documented, and communicating necessary information to the patient's healthcare team.    Bevelyn Ngo, NP 09/12/2023  9:38 AM

## 2023-09-13 ENCOUNTER — Encounter: Payer: Self-pay | Admitting: Physician Assistant

## 2023-09-18 ENCOUNTER — Inpatient Hospital Stay: Payer: Medicare Other | Attending: Internal Medicine

## 2023-09-18 ENCOUNTER — Inpatient Hospital Stay (HOSPITAL_BASED_OUTPATIENT_CLINIC_OR_DEPARTMENT_OTHER): Payer: Medicare Other | Admitting: Internal Medicine

## 2023-09-18 VITALS — BP 134/84 | HR 97 | Temp 97.8°F | Resp 16 | Ht <= 58 in | Wt 118.6 lb

## 2023-09-18 DIAGNOSIS — C3431 Malignant neoplasm of lower lobe, right bronchus or lung: Secondary | ICD-10-CM | POA: Insufficient documentation

## 2023-09-18 DIAGNOSIS — C3490 Malignant neoplasm of unspecified part of unspecified bronchus or lung: Secondary | ICD-10-CM | POA: Diagnosis not present

## 2023-09-18 DIAGNOSIS — Z79899 Other long term (current) drug therapy: Secondary | ICD-10-CM | POA: Insufficient documentation

## 2023-09-18 LAB — CMP (CANCER CENTER ONLY)
ALT: 19 U/L (ref 0–44)
AST: 24 U/L (ref 15–41)
Albumin: 4.1 g/dL (ref 3.5–5.0)
Alkaline Phosphatase: 67 U/L (ref 38–126)
Anion gap: 6 (ref 5–15)
BUN: 11 mg/dL (ref 8–23)
CO2: 32 mmol/L (ref 22–32)
Calcium: 9.2 mg/dL (ref 8.9–10.3)
Chloride: 104 mmol/L (ref 98–111)
Creatinine: 0.59 mg/dL (ref 0.44–1.00)
GFR, Estimated: >60 mL/min (ref >60)
Glucose, Bld: 96 mg/dL (ref 70–99)
Potassium: 3.7 mmol/L (ref 3.5–5.1)
Sodium: 142 mmol/L (ref 135–145)
Total Bilirubin: 1.1 mg/dL (ref 0.3–1.2)
Total Protein: 6.7 g/dL (ref 6.5–8.1)

## 2023-09-18 LAB — CBC WITH DIFFERENTIAL (CANCER CENTER ONLY)
Abs Immature Granulocytes: 0.03 10*3/uL (ref 0.00–0.07)
Basophils Absolute: 0 10*3/uL (ref 0.0–0.1)
Basophils Relative: 0 %
Eosinophils Absolute: 0.1 10*3/uL (ref 0.0–0.5)
Eosinophils Relative: 1 %
HCT: 41.5 % (ref 36.0–46.0)
Hemoglobin: 13.9 g/dL (ref 12.0–15.0)
Immature Granulocytes: 0 %
Lymphocytes Relative: 20 %
Lymphs Abs: 1.6 10*3/uL (ref 0.7–4.0)
MCH: 29.8 pg (ref 26.0–34.0)
MCHC: 33.5 g/dL (ref 30.0–36.0)
MCV: 88.9 fL (ref 80.0–100.0)
Monocytes Absolute: 0.5 10*3/uL (ref 0.1–1.0)
Monocytes Relative: 6 %
Neutro Abs: 5.8 10*3/uL (ref 1.7–7.7)
Neutrophils Relative %: 73 %
Platelet Count: 285 10*3/uL (ref 150–400)
RBC: 4.67 MIL/uL (ref 3.87–5.11)
RDW: 13.5 % (ref 11.5–15.5)
WBC Count: 8 10*3/uL (ref 4.0–10.5)
nRBC: 0 % (ref 0.0–0.2)

## 2023-09-18 MED ORDER — CLINDAMYCIN PHOSPHATE 1 % EX GEL: Freq: Two times a day (BID) | CUTANEOUS | 0 refills | Status: DC

## 2023-09-18 MED ORDER — DOXYCYCLINE HYCLATE 100 MG PO TABS: 100.0000 mg | ORAL_TABLET | Freq: Two times a day (BID) | ORAL | 0 refills | Status: DC

## 2023-09-18 MED ORDER — HYDROCOD POLI-CHLORPHE POLI ER 10-8 MG/5ML PO SUER: 5.0000 mL | Freq: Two times a day (BID) | ORAL | 0 refills | Status: DC | PRN

## 2023-09-18 NOTE — Progress Notes (Signed)
#        [facility] [phone]   [phone]  OFFICE PROGRESS NOTE  [name], MD [address] [place] [address]  PRINCIPAL DIAGNOSIS: Local recurrence of non-small cell lung cancer, adenocarcinoma, initially diagnosed as stage IB (T2a N0 M0) in March of 2010.   PRIOR THERAPY:  Status post right lower lobectomy under the care of Dr. Laneta Simmers on 04/08/2009. Status post palliative radiotherapy to the right lower lobe recurrent lung mass under the care of Dr. Mitzi Hansen, completed on June 08, 2011. Systemic chemotherapy with carboplatin for AUC of 5 and Alimta 500 mg/M2 every 3 weeks. She is status post 3 cycles.  CURRENT THERAPY: Tarceva 150 mg by mouth daily, therapy beginning 07/24/2012. Status post approximately 124 months of therapy.   INTERVAL HISTORY: [name] 62 y.o. female returns to the clinic today for follow-up visit accompanied by her daughter and interpreter. Discussed the use of AI scribe software for clinical note transcription with the patient, who gave verbal consent to proceed.  History of Present Illness   The patient, a 62 year old individual with a history of stage 1B non-small cell lung adenocarcinoma, initially diagnosed in March 2010, underwent a right lower lobectomy in May 2010. The patient was stable for two years post-surgery until a local recurrence was identified in July 2012 treated with radiotherapy. Subsequently, the patient received three rounds of chemotherapy with carboplatin and Alimta, and has been on Tarceva (150mg ) for the past 125 months.  The patient has been experiencing common side effects of Tarceva, including skin rash and diarrhea. A recent scan in September showed an increase in the size and density of the area where the tumor was previously located, along with the presence of another solid nodule. A subsequent bronchoscopy and biopsy confirmed the recurrence of adenocarcinoma in the lung in the  same area.  The patient also reported issues with her toes, which could be a side effect of Tarceva, known as paronychia. The patient has been using clindamycin lotion for the skin rash and has been prescribed doxycycline, an oral antibiotic, for ten days to help manage the condition.       MEDICAL HISTORY: Past Medical History:  Diagnosis Date   Drug-induced skin rash 02/27/2017   Encounter for antineoplastic chemotherapy 01/19/2016   History of radiation therapy 05/02/11 to 06/08/11   right lung   Lung cancer (HCC)    right lower lobe adenocarcinoma    ALLERGIES:  is allergic to codeine and doxycycline.  MEDICATIONS:  Current Outpatient Medications  Medication Sig Dispense Refill   albuterol (PROAIR HFA) 108 (90 Base) MCG/ACT inhaler Inhale 1-2 puffs into the lungs every 6 (six) hours as needed for wheezing or shortness of breath. 6.7 g 2   chlorpheniramine-HYDROcodone (TUSSIONEX) 10-8 MG/5ML Take 5 mLs by mouth every 12 (twelve) hours as needed for cough. 115 mL 0   clindamycin (CLINDAGEL) 1 % gel Apply to affected area 2 (two) times daily. 30 g 0   diltiazem (TIADYLT ER) 120 MG 24 hr capsule Take 1 capsule by mouth daily. 90 capsule 2   erlotinib (TARCEVA) 150 MG tablet Take 1 tablet (150 mg total) by mouth daily. Take on an empty stomach at least 1 hour before or 2 hours after food. 30 tablet 10   No current facility-administered medications for this visit.    SURGICAL HISTORY:  Past Surgical History:  Procedure Laterality Date   BRONCHIAL BIOPSY  09/05/2023   Procedure: BRONCHIAL  BIOPSIES;  Surgeon: Leslye Peer, MD;  Location: [facility] ENDOSCOPY;  Service: Pulmonary;;   BRONCHIAL BRUSHINGS  09/05/2023   Procedure: BRONCHIAL BRUSHINGS;  Surgeon: Leslye Peer, MD;  Location: [facility] ENDOSCOPY;  Service: Pulmonary;;   BRONCHIAL NEEDLE ASPIRATION BIOPSY  09/05/2023   Procedure: BRONCHIAL NEEDLE ASPIRATION BIOPSIES;  Surgeon: Leslye Peer, MD;  Location: [facility] ENDOSCOPY;  Service:  Pulmonary;;   FIDUCIAL MARKER PLACEMENT  09/05/2023   Procedure: FIDUCIAL MARKER PLACEMENT;  Surgeon: Leslye Peer, MD;  Location: [facility] ENDOSCOPY;  Service: Pulmonary;;   LUNG LOBECTOMY  04/18/2009   [facility]   PORTACATH PLACEMENT  02/29/2012   Procedure: INSERTION PORT-A-CATH;  Surgeon: Ines Bloomer, MD;  Location: [facility] OR;  Service: Thoracic;  Laterality: Left;  PowerPort 8 F attachable in left internal jugular.    REVIEW OF SYSTEMS:  Constitutional: negative Eyes: negative Ears, nose, mouth, throat, and face: negative Respiratory: positive for cough Cardiovascular: negative Gastrointestinal: positive for diarrhea Genitourinary:negative Integument/breast: positive for rash Hematologic/lymphatic: negative Musculoskeletal:negative Neurological: negative Behavioral/Psych: negative Endocrine: negative Allergic/Immunologic: negative   PHYSICAL EXAMINATION: General appearance: alert, cooperative, and appears stated age Head: Normocephalic, without obvious abnormality, atraumatic Neck: no adenopathy, no JVD, supple, symmetrical, trachea midline, and thyroid not enlarged, symmetric, no tenderness/mass/nodules Lymph nodes: Cervical, supraclavicular, and axillary nodes normal. Resp: clear to auscultation bilaterally Back: symmetric, no curvature. ROM normal. No CVA tenderness. Cardio: regular rate and rhythm, S1, S2 normal, no murmur, click, rub or gallop GI: soft, non-tender; bowel sounds normal; no masses,  no organomegaly Extremities: extremities normal, atraumatic, no cyanosis or edema Neurologic: Alert and oriented X 3, normal strength and tone. Normal symmetric reflexes. Normal coordination and gait  ECOG PERFORMANCE STATUS: 1 - Symptomatic but completely ambulatory  Blood pressure 134/84, pulse 97, temperature 97.8 F (36.6 C), temperature source Oral, resp. rate 16, height 4' (1.219 m), weight 118 lb 9.6 oz (53.8 kg), last menstrual period 02/10/2012, SpO2 98%.  LABORATORY DATA: Lab  Results  Component Value Date   WBC 8.0 09/18/2023   HGB 13.9 09/18/2023   HCT 41.5 09/18/2023   MCV 88.9 09/18/2023   PLT 285 09/18/2023      Chemistry      Component Value Date/Time   NA 142 09/18/2023 0948   NA 143 10/30/2017 1031   K 3.7 09/18/2023 0948   K 4.3 10/30/2017 1031   CL 104 09/18/2023 0948   CL 102 04/30/2013 1002   CO2 32 09/18/2023 0948   CO2 29 10/30/2017 1031   BUN 11 09/18/2023 0948   BUN 14.6 10/30/2017 1031   CREATININE 0.59 09/18/2023 0948   CREATININE 0.7 10/30/2017 1031      Component Value Date/Time   CALCIUM 9.2 09/18/2023 0948   CALCIUM 9.2 10/30/2017 1031   ALKPHOS 67 09/18/2023 0948   ALKPHOS 103 10/30/2017 1031   AST 24 09/18/2023 0948   AST 20 10/30/2017 1031   ALT 19 09/18/2023 0948   ALT 14 10/30/2017 1031   BILITOT 1.1 09/18/2023 0948   BILITOT 0.79 10/30/2017 1031       RADIOGRAPHIC STUDIES: DG Chest Port 1 View  Result Date: 09/05/2023 CLINICAL DATA:  62 year old female status post bronchoscopy and bronchoscopic biopsy. Previous right lower lobectomy, lung adenocarcinoma. EXAM: PORTABLE CHEST 1 VIEW COMPARISON:  CT chest 08/07/2023 and earlier. FINDINGS: Portable AP upright view at 0930 hours. Stable left chest power port. Stable cardiac size and mediastinal contours. Visualized tracheal air column is within normal limits. Nodular 1.5 cm peribronchial opacity inferior  to the right hilum with a small bronchoscopic clip or marker adjacent. Otherwise stable right lung base hypo ventilation. No pneumothorax. Left lung appears stable and negative. No acute osseous abnormality identified.  Paucity of bowel gas. IMPRESSION: 1. Post bronchoscopy, post biopsy changes inferior to the right hilum with small metallic clip or marker. No pneumothorax or other adverse features. 2. Otherwise stable chest including right lung base opacification. Electronically Signed   By: Odessa Fleming M.D.   On: 09/05/2023 12:02   DG C-ARM BRONCHOSCOPY  Result Date:  09/05/2023 C-ARM BRONCHOSCOPY: Fluoroscopy was utilized by the requesting physician.  No radiographic interpretation.   DG C-Arm 1-60 Min-No Report  Result Date: 09/05/2023 Fluoroscopy was utilized by the requesting physician.  No radiographic interpretation.     ASSESSMENT AND PLAN:  This is a very pleasant 62 years old Asian female with recurrent non-small cell lung cancer, adenocarcinoma status post surgical resection followed by systemic chemotherapy with carboplatin and Alimta followed by disease progression and the patient was started on treatment with Tarceva 150 mg by mouth daily status post 124 months  Her previous CT scan of the chest showed suspicious disease recurrence.  I ordered a PET scan which was performed recently and showed no evidence for metabolic activity associated with the right upper lobe pulmonary nodule or evidence for disease recurrence locally and there was some post radiation changes. She continued her treatment with Tarceva with the same dose.  She has been tolerating this treatment fairly well with no concerning adverse effects. She had repeat CT scan of the chest performed recently.  I personally and independently reviewed the scan images and discussed the result with the patient today. Her scan showed continuous increased consolidation and groundglass opacity adjacent to the posttreatment area again concerning for low-grade adenocarcinoma. The patient was referred to Dr. Delton Coombes and she underwent bronchoscopy that showed recurrent adenocarcinoma. I had a lengthy discussion with the patient and her daughter today about her current condition and treatment options.    Non-small cell lung cancer (Adenocarcinoma) Recurrence in the same area as the original tumor, despite ongoing Tarceva therapy. Discussed the option of radiation therapy for the new area of growth. -Continue Tarceva 150mg  daily. -Refer to Dr. Kathrynn Running for radiation therapy consultation.  Tarceva side  effects Diarrhea and skin rash. -Continue clindamycin lotion for skin rash. -Start Doxycycline 100mg  twice daily for 10 days for skin rash. -Continue Tussionex  for cough.  Toe issue Possible paronychia, potentially related to Tarceva. -Continue current management. -Refer to podiatrist if condition worsens.  Follow-up in 2 months with repeat blood work and scan.  The patient was advised to call immediately if she has any other concerning symptoms in the interval.   All questions were answered. The patient knows to call the clinic with any problems, questions or concerns. We can certainly see the patient much sooner if necessary. The total time spent in the appointment was 20 minutes. The total time spent in the appointment was 30 minutes.  Disclaimer: This note was dictated with voice recognition software. Similar sounding words can inadvertently be transcribed and may not be corrected upon review.

## 2023-09-20 ENCOUNTER — Ambulatory Visit (HOSPITAL_COMMUNITY)
Admission: RE | Admit: 2023-09-20 | Discharge: 2023-09-20 | Disposition: A | Discharge status: Home / Self Care | Payer: Medicare Other | Source: Ambulatory Visit | Attending: Acute Care | Admitting: Acute Care

## 2023-09-20 ENCOUNTER — Other Ambulatory Visit (HOSPITAL_COMMUNITY): Payer: Self-pay

## 2023-09-20 DIAGNOSIS — E041 Nontoxic single thyroid nodule: Secondary | ICD-10-CM | POA: Diagnosis not present

## 2023-09-20 DIAGNOSIS — R9389 Abnormal findings on diagnostic imaging of other specified body structures: Secondary | ICD-10-CM | POA: Diagnosis not present

## 2023-09-20 NOTE — Progress Notes (Signed)
Thoracic Location of Tumor / Histology: Right Upper Lobe Lung Ca  09/05/2023 Dr. Levy Pupa DG Chest Port 1 view CLINICAL DATA:  62 year old female status post bronchoscopy and bronchoscopic biopsy. Previous right lower lobectomy, lung adenocarcinoma.   IMPRESSION: 1. Post bronchoscopy, post biopsy changes inferior to the right hilum with small metallic clip or marker. No pneumothorax or other adverse features. 2. Otherwise stable chest including right lung base opacification.   08/07/2023 Dr. Arbutus Ped CT Chest with Contrast CLINICAL DATA:  Right lower lobe primary lung adenocarcinoma status post right lower lobectomy.  IMPRESSION: 1. Interval increased consolidations and ground-glass opacities adjacent to the posttreatment area, despite lack of significant FDG uptake on prior PET, findings are highly concerning for local recurrence (likely low-grade adenocarcinoma) given progression over multiple prior exams. Consider further evaluation with tissue sampling. 2. Increased size of subsolid nodules of the right upper lobe, concerning for multifocal adenocarcinoma. 3. Stable trace right pleural effusion and pleural thickening. 4. Coronary artery calcifications and aortic Atherosclerosis (ICD10-I70.0).   03/31/2023 Dr. Arbutus Ped NM PET Image Restage  (PS) Skull Base to Thigh CLINICAL DATA:  Subsequent treatment strategy for non-small cell lung cancer. Concern for recurrence or synchronous bronchogenic carcinoma in the RIGHT upper lobe with suspicious part solid nodule.  IMPRESSION: 1. No metabolic activity associated with RIGHT upper lobe pulmonary nodule of concern. Findings not typical of bronchogenic carcinoma. However cannot exclude a low-grade adenocarcinoma. Recommend attention on follow-up versus tissue sampling. 2. Minimal metabolic activity within the posterior RIGHT upper lobe consolidation is favored post radiation and postsurgical change. 3. No evidence of  mediastinal metastatic nodal disease or distant metastatic disease. 4. Intense metabolic activity associated with a nodule within the RIGHT lobe of thyroid gland. Recommend ultrasound and potential tissue sampling.   Past/Anticipated interventions by cardiothoracic surgery, if any:  09/05/2023 Dr. Levy Pupa Robotic Assisted Navigational Bronchoscopy  Past/Anticipated interventions by medical oncologist, if any: Dr. Arbutus Ped   Tobacco/Marijuana/Snuff/ETOH use:  No  Signs/Symptoms Weight changes, if any: Last 6 months loss 6lbs. Respiratory complaints, if any: With exertion SOB. Hemoptysis, if any: No Pain issues, if any:  0/10  SAFETY ISSUES: Prior radiation? Yes, lung cancer with Dr. Mitzi Hansen. Pacemaker/ICD?  No Possible current pregnancy? Post menopausal Is the patient on methotrexate? No  Current Complaints / other details:

## 2023-09-21 ENCOUNTER — Telehealth: Payer: Self-pay | Admitting: Medical Oncology

## 2023-09-21 ENCOUNTER — Other Ambulatory Visit (HOSPITAL_COMMUNITY): Payer: Self-pay

## 2023-09-21 ENCOUNTER — Other Ambulatory Visit: Payer: Self-pay | Admitting: Physician Assistant

## 2023-09-21 DIAGNOSIS — R059 Cough, unspecified: Secondary | ICD-10-CM

## 2023-09-21 MED ORDER — HYDROCOD POLI-CHLORPHE POLI ER 10-8 MG/5ML PO SUER
5.0000 mL | Freq: Two times a day (BID) | ORAL | 0 refills | Status: DC | PRN
  Filled 2023-09-21 – 2023-09-27 (×2): qty 115, 12d supply, fill #0

## 2023-09-21 NOTE — Telephone Encounter (Signed)
Pease reroute her cough medication to [facility] . [facility] does not have any in stock and [facility] does. l

## 2023-09-22 ENCOUNTER — Encounter: Payer: Self-pay | Admitting: Internal Medicine

## 2023-09-22 ENCOUNTER — Other Ambulatory Visit: Payer: Self-pay

## 2023-09-22 ENCOUNTER — Encounter: Payer: Self-pay | Admitting: Pharmacist

## 2023-09-25 ENCOUNTER — Other Ambulatory Visit: Payer: Self-pay

## 2023-09-25 ENCOUNTER — Other Ambulatory Visit (HOSPITAL_COMMUNITY): Payer: Self-pay

## 2023-09-25 ENCOUNTER — Ambulatory Visit
Admission: RE | Admit: 2023-09-25 | Discharge: 2023-09-25 | Disposition: A | Discharge status: Home / Self Care | Payer: Medicare Other | Source: Ambulatory Visit | Attending: Radiation Oncology | Admitting: Radiation Oncology

## 2023-09-25 VITALS — BP 149/95 | HR 93 | Temp 97.1°F | Resp 20 | Ht <= 58 in | Wt 116.4 lb

## 2023-09-25 DIAGNOSIS — Z79899 Other long term (current) drug therapy: Secondary | ICD-10-CM | POA: Insufficient documentation

## 2023-09-25 DIAGNOSIS — R21 Rash and other nonspecific skin eruption: Secondary | ICD-10-CM | POA: Diagnosis not present

## 2023-09-25 DIAGNOSIS — C3411 Malignant neoplasm of upper lobe, right bronchus or lung: Secondary | ICD-10-CM

## 2023-09-25 DIAGNOSIS — Z923 Personal history of irradiation: Secondary | ICD-10-CM | POA: Diagnosis not present

## 2023-09-25 DIAGNOSIS — C3431 Malignant neoplasm of lower lobe, right bronchus or lung: Secondary | ICD-10-CM | POA: Diagnosis not present

## 2023-09-25 NOTE — Progress Notes (Signed)
Specialty Pharmacy Refill Coordination Note  [name] is a 62 y.o. female contacted today regarding refills of specialty medication(s) Erlotinib Hcl Spoke with patient's daughter, [name]   Patient requested Delivery   Delivery date: 10/04/23   Verified address: [address] [place] [address]   Medication will be filled on 10/03/23.

## 2023-09-25 NOTE — Progress Notes (Signed)
[facility]         [phone] ________________________________  Initial outpatient Consultation  Name: [name] MRN: [id number]  Date: 09/25/2023  DOB: Nov 17, 1961  REFERRING PHYSICIAN: Si Gaul, MD  DIAGNOSIS: 62 yo woman with a local recurrence of non-small cell lung cancer, adenocarcinoma, initially diagnosed as stage IB (T2a N0 M0) in March of 2010  The primary encounter diagnosis was Malignant neoplasm of right upper lobe of lung (HCC). A diagnosis of Primary cancer of right lower lobe of lung (HCC) was also pertinent to this visit.    ICD-10-CM   1. Malignant neoplasm of right upper lobe of lung (HCC)  C34.11     2. Primary cancer of right lower lobe of lung (HCC)  C34.31       HISTORY OF PRESENT ILLNESS::[name] is a 62 y.o. female who is being seen at the request of Dr. Arbutus Ped. She was origionally diagnosed with stage 1B 4 cm non-small cell lung adenocarcinoma in March of 2010. At that time, she underwent a right lower lobectomy in May of 2010. The patient had no evidence of disease recurrence until July of 2012. At that time she was treated with radiation and subsequently received three cycles of chemotherapy with carboplatin and Alimta. She was started on Tarceva therapy under the care of Dr. Arbutus Ped. In March of 2017, she developed a slight enlargement of the semisolid nodular density in the previously treated, right lower lobe. Dr. Mitzi Hansen recommended a palliative course of SBRT to this area. Patient completed this on 03/31/2016. She remained on Tarceva therapy and has been followed by Dr. Arbutus Ped since this treatment.   Most recently, the patient underwent a follow-up CT scan on 08/07/2023. Imaging demonstrated an interval increase in consolidations and ground-glass opacities adjacent to the posttreatment area along with an increase in size of subsolid nodules of the right upper lobe. Subsequently, the patient underwent a biopsy of two lesions with Dr.  Delton Coombes described as follows:  Target #1 was previously treated by SBRT and benign   Biopsy of Target #2 revealed new adenocarcinoma of the lung.    The patient returned to Dr. Arbutus Ped on 09/18/23 to review the results of the biopsy. After discussing her treatment options, she wishes to pursue radiation therapy. She was kindly referred today to discuss radiation options.    PREVIOUS RADIATION THERAPY: Yes   05/02/2011 to 06/08/2011.: 3-D conformal hypofractionated 70.2 Gy at 2.7 Gy per fraction to the right lower lobe.           03/23/2016 through 03/31/2016  The tumor in the right lower lobe was treated with a course of stereotactic body radiation treatment. The patient received 54 Gy In 3 fractions at 18 Gy per fraction      Past Medical History:  Diagnosis Date   Drug-induced skin rash 02/27/2017   Encounter for antineoplastic chemotherapy 01/19/2016   History of radiation therapy 05/02/11 to 06/08/11   right lung   Lung cancer [facility])    right lower lobe adenocarcinoma  :   Past Surgical History:  Procedure Laterality Date   BRONCHIAL BIOPSY  09/05/2023   Procedure: BRONCHIAL BIOPSIES;  Surgeon: Leslye Peer, MD;  Location: [facility] ENDOSCOPY;  Service: Pulmonary;;   BRONCHIAL BRUSHINGS  09/05/2023   Procedure: BRONCHIAL BRUSHINGS;  Surgeon: Leslye Peer, MD;  Location: [facility] ENDOSCOPY;  Service: Pulmonary;;   BRONCHIAL NEEDLE ASPIRATION BIOPSY  09/05/2023   Procedure: BRONCHIAL NEEDLE ASPIRATION BIOPSIES;  Surgeon: Leslye Peer, MD;  Location: [facility] ENDOSCOPY;  Service: Pulmonary;;   FIDUCIAL MARKER PLACEMENT  09/05/2023   Procedure: FIDUCIAL MARKER PLACEMENT;  Surgeon: Leslye Peer, MD;  Location: [facility] ENDOSCOPY;  Service: Pulmonary;;   LUNG LOBECTOMY  04/18/2009   RLL   PORTACATH PLACEMENT  02/29/2012   Procedure: INSERTION PORT-A-CATH;  Surgeon: Ines Bloomer, MD;  Location: [facility] OR;  Service: Thoracic;  Laterality: Left;  PowerPort 8 F attachable in left internal jugular.   :   Current Outpatient Medications:    albuterol (PROAIR HFA) 108 (90 Base) MCG/ACT inhaler, Inhale 1-2 puffs into the lungs every 6 (six) hours as needed for wheezing or shortness of breath., Disp: 6.7 g, Rfl: 2   chlorpheniramine-HYDROcodone (TUSSIONEX) 10-8 MG/5ML, Take 5 mLs by mouth every 12 (twelve) hours as needed for cough., Disp: 115 mL, Rfl: 0   clindamycin (CLINDAGEL) 1 % gel, Apply to affected area 2 (two) times daily., Disp: 30 g, Rfl: 0   diltiazem (TIADYLT ER) 120 MG 24 hr capsule, Take 1 capsule by mouth daily., Disp: 90 capsule, Rfl: 2   doxycycline (VIBRA-TABS) 100 MG tablet, Take 1 tablet (100 mg total) by mouth 2 (two) times daily., Disp: 20 tablet, Rfl: 0   erlotinib (TARCEVA) 150 MG tablet, Take 1 tablet (150 mg total) by mouth daily. Take on an empty stomach at least 1 hour before or 2 hours after food., Disp: 30 tablet, Rfl: 10:   Allergies  Allergen Reactions   Codeine Nausea Only   Doxycycline Nausea And Vomiting    vomiting and headache  :   Family History  Problem Relation Age of Onset   Heart Problems Mother    Cancer Neg Hx   :   Social History   Socioeconomic History   Marital status: Married    Spouse name: Not on file   Number of children: 7   Years of education: Not on file   Highest education level: Not on file  Occupational History   Not on file  Tobacco Use   Smoking status: Never   Smokeless tobacco: Never  Vaping Use   Vaping status: Never Used  Substance and Sexual Activity   Alcohol use: No   Drug use: No   Sexual activity: Yes  Other Topics Concern   Not on file  Social History Narrative   09/01/2014   The patient is a 62 year old [place] woman.   The patient is married and has 7 children. One child passed away.   The patient came to the [place] in approximately 1990. They were in [place] for 10-11 years and then moved to [place], [place] since then.   Patient does not smoke, drink, or use illicit  drugs.   Patient has not used smokeless tobacco products.   Fun/Hobby: Go shopping    Social Determinants of Health   Financial Resource Strain: Not on file  Food Insecurity: No Food Insecurity (09/25/2023)   Hunger Vital Sign    Worried About Running Out of Food in the Last Year: Never true    Ran Out of Food in the Last Year: Never true  Transportation Needs: No Transportation Needs (09/25/2023)   PRAPARE - Administrator, Civil Service (Medical): No    Lack of Transportation (Non-Medical): No  Physical Activity: Not on file  Stress: Not on file  Social Connections: Not on file  Intimate Partner Violence: Not At Risk (09/25/2023)   Humiliation, Afraid, Rape, and Kick questionnaire    Fear  of Current or Ex-Partner: No    Emotionally Abused: No    Physically Abused: No    Sexually Abused: No  :  REVIEW OF SYSTEMS:  Positive dyspnea on exertion at baseline and 6 lb weight loss. Negative for cough, hemoptysis, or chest pain.    PHYSICAL EXAM:  Blood pressure (!) 149/95, pulse 93, temperature (!) 97.1 F (36.2 C), temperature source Temporal, resp. rate 20, height 4' (1.219 m), weight 116 lb 6 oz (52.8 kg), last menstrual period 02/10/2012.  Today's Vitals   09/25/23 1039  BP: (!) 149/95  Pulse: 93  Resp: 20  Temp: (!) 97.1 F (36.2 C)  TempSrc: Temporal  Weight: 116 lb 6 oz (52.8 kg)  Height: 4' (1.219 m)  PainSc: 0-No pain   Body mass index is 35.51 kg/m.  In general this is a well appearing female in no acute distress. She's alert and oriented x4 and appropriate throughout the examination. Cardiopulmonary assessment is negative for acute distress and she exhibits normal effort.      KPS = 90  100 - Normal; no complaints; no evidence of disease. 90   - Able to carry on normal activity; minor signs or symptoms of disease. 80   - Normal activity with effort; some signs or symptoms of disease. 67   - Cares for self; unable to carry on normal activity or  to do active work. 60   - Requires occasional assistance, but is able to care for most of his personal needs. 50   - Requires considerable assistance and frequent medical care. 40   - Disabled; requires special care and assistance. 30   - Severely disabled; hospital admission is indicated although death not imminent. 20   - Very sick; hospital admission necessary; active supportive treatment necessary. 10   - Moribund; fatal processes progressing rapidly. 0     - Dead  Karnofsky DA, Abelmann WH, Craver LS and Burchenal [facility] [phone]) The use of the nitrogen mustards in the palliative treatment of carcinoma: with particular reference to bronchogenic carcinoma Cancer 1 634-56  LABORATORY DATA:  Lab Results  Component Value Date   WBC 8.0 09/18/2023   HGB 13.9 09/18/2023   HCT 41.5 09/18/2023   MCV 88.9 09/18/2023   PLT 285 09/18/2023   Lab Results  Component Value Date   NA 142 09/18/2023   K 3.7 09/18/2023   CL 104 09/18/2023   CO2 32 09/18/2023   Lab Results  Component Value Date   ALT 19 09/18/2023   AST 24 09/18/2023   ALKPHOS 67 09/18/2023   BILITOT 1.1 09/18/2023     RADIOGRAPHY: US THYROID  Result Date: 09/24/2023 CLINICAL DATA:  Abnormal imaging of the thyroid EXAM: THYROID ULTRASOUND TECHNIQUE: Ultrasound examination of the thyroid gland and adjacent soft tissues was performed. COMPARISON:  PET-CT 03/31/2023 FINDINGS: Parenchymal Echotexture: Mildly heterogenous Isthmus: 0.3 cm Right lobe: 5.5 x 2.0 x 3.0 cm Left lobe: 4.5 x 0.8 x 1.7 cm _________________________________________________________ Estimated total number of nodules >/= 1 cm: 1 Number of spongiform nodules >/=  2 cm not described below (TR1): 0 Number of mixed cystic and solid nodules >/= 1.5 cm not described below (TR2): 0 _________________________________________________________ Nodule # 1: Location: Right; mid Maximum size: 2.9 cm; Other 2 dimensions: 1.6 x 1.4 cm Composition: solid/almost completely solid (2)  Echogenicity: isoechoic (1) Shape: not taller-than-wide (0) Margins: smooth (0) Echogenic foci: none (0) ACR TI-RADS total points: 3. ACR TI-RADS risk category: TR3 (3 points). ACR TI-RADS recommendations: **  Given size (>/= 2.5 cm) and appearance, fine needle aspiration of this mildly suspicious nodule should be considered based on TI-RADS criteria. _________________________________________________________ IMPRESSION: 2.9 cm (TI-RADS 3) right mid thyroid nodule should be further evaluated with FNA. This nodule corresponds to the abnormality seen on PET-CT. The above is in keeping with the ACR TI-RADS recommendations - J Am Coll Radiol 2017;14:587-595. Electronically Signed   By: Acquanetta Belling M.D.   On: 09/24/2023 20:00   DG Chest Port 1 View  Result Date: 09/05/2023 CLINICAL DATA:  62 year old female status post bronchoscopy and bronchoscopic biopsy. Previous right lower lobectomy, lung adenocarcinoma. EXAM: PORTABLE CHEST 1 VIEW COMPARISON:  CT chest 08/07/2023 and earlier. FINDINGS: Portable AP upright view at 0930 hours. Stable left chest power port. Stable cardiac size and mediastinal contours. Visualized tracheal air column is within normal limits. Nodular 1.5 cm peribronchial opacity inferior to the right hilum with a small bronchoscopic clip or marker adjacent. Otherwise stable right lung base hypo ventilation. No pneumothorax. Left lung appears stable and negative. No acute osseous abnormality identified.  Paucity of bowel gas. IMPRESSION: 1. Post bronchoscopy, post biopsy changes inferior to the right hilum with small metallic clip or marker. No pneumothorax or other adverse features. 2. Otherwise stable chest including right lung base opacification. Electronically Signed   By: Odessa Fleming M.D.   On: 09/05/2023 12:02   DG C-ARM BRONCHOSCOPY  Result Date: 09/05/2023 C-ARM BRONCHOSCOPY: Fluoroscopy was utilized by the requesting physician.  No radiographic interpretation.   DG C-Arm 1-60 Min-No  Report  Result Date: 09/05/2023 Fluoroscopy was utilized by the requesting physician.  No radiographic interpretation.      IMPRESSION: 62 yo woman with a local recurrence of non-small cell lung cancer, adenocarcinoma, initially diagnosed as stage IB (T2a N0 M0) in March of 2010.   Patient's case was discussed at our tumor board and the consensus was to proceed with stereotactic body radiotherapy (SBRT) to the biopsy proven area of disease recurrence. Today, we talked to the patient about the findings and work-up thus far.  We discussed the natural history of adenocarcinoma and general treatment, highlighting the role of radiotherapy in the management.  We discussed the available radiation techniques, and focused on the details of logistics and delivery.  We reviewed the anticipated acute and late sequelae associated with radiation in this setting.  The patient was encouraged to ask questions that I answered to the best of our ability. A patient consent form was discussed and signed.  We retained a copy for our records.  The patient would like to proceed with radiation and will be scheduled for CT simulation.  PLAN: Patient is scheduled for CT simulation on 10/02/2023. Anticipate 3-5 fractions to the visualized right lung tumor. We look forward to participating in this patient's care.   I personally spent 60 minutes in this encounter including chart review, reviewing radiological studies, meeting face-to-face with the patient, entering orders and completing documentation.    ------------------------------------------------   Joyice Faster, PA-C    Margaretmary Dys, MD  [facility]  Radiation Oncology Direct Dial: [phone]  Fax: [phone] [email or web address]  Skype  [email or web address]

## 2023-09-25 NOTE — Progress Notes (Signed)
Specialty Pharmacy Ongoing Clinical Assessment Note  [name] is a 62 y.o. female who is being followed by the specialty pharmacy service for [facility]. Clinical Follow-up outreach was provided by daughter, [name] who does not need an interpreter.   Patient's specialty medication(s) reviewed today: Erlotinib Hcl   Missed doses in the last 4 weeks: 0   Patient/Caregiver did not have any additional questions or concerns.   Therapeutic benefit summary: Unable to assess   Adverse events/side effects summary: No adverse events/side effects   Patient's therapy is appropriate to: Continue    Goals Addressed             This Visit's Progress    Stabilization of disease       Patient is on track. Patient will maintain adherence         Follow up:  6 months  Bobette Mo Specialty Pharmacist

## 2023-09-27 ENCOUNTER — Encounter: Payer: Self-pay | Admitting: Internal Medicine

## 2023-09-27 ENCOUNTER — Other Ambulatory Visit (HOSPITAL_COMMUNITY): Payer: Self-pay

## 2023-09-29 ENCOUNTER — Encounter: Payer: Self-pay | Admitting: Internal Medicine

## 2023-10-02 ENCOUNTER — Ambulatory Visit
Admission: RE | Admit: 2023-10-02 | Discharge: 2023-10-02 | Disposition: A | Discharge status: Home / Self Care | Payer: Medicare Other | Source: Ambulatory Visit | Attending: Radiation Oncology | Admitting: Radiation Oncology

## 2023-10-02 DIAGNOSIS — Z510 Encounter for antineoplastic radiation therapy: Secondary | ICD-10-CM | POA: Insufficient documentation

## 2023-10-02 DIAGNOSIS — C3411 Malignant neoplasm of upper lobe, right bronchus or lung: Secondary | ICD-10-CM | POA: Diagnosis not present

## 2023-10-02 DIAGNOSIS — C3431 Malignant neoplasm of lower lobe, right bronchus or lung: Secondary | ICD-10-CM | POA: Diagnosis not present

## 2023-10-02 NOTE — Progress Notes (Signed)
  Radiation Oncology         [phone] ________________________________  Name: Curtistine Pettitt MRN: [id number]  Date: 10/02/2023  DOB: 25-Oct-1961  STEREOTACTIC BODY RADIOTHERAPY SIMULATION AND TREATMENT PLANNING NOTE    ICD-10-CM   1. Primary cancer of right lower lobe of lung (HCC)  C34.31       DIAGNOSIS:  62 yo woman with new metachronous right upper lobe non-small cell lung cancer, adenocarcinoma  NARRATIVE:  The patient was brought to the CT Simulation planning suite.  Identity was confirmed.  All relevant records and images related to the planned course of therapy were reviewed.  The patient freely provided informed written consent to proceed with treatment after reviewing the details related to the planned course of therapy. The consent form was witnessed and verified by the simulation staff.  Then, the patient was set-up in a stable reproducible  supine position for radiation therapy.  A BodyFix immobilization pillow was fabricated for reproducible positioning.  Then I personally applied the abdominal compression paddle to limit respiratory excursion.  4D respiratoy motion management CT images were obtained.  Surface markings were placed.  The CT images were loaded into the planning software.  Then, using Cine, MIP, and standard views, the internal target volume (ITV) and planning target volumes (PTV) were delinieated, and avoidance structures were contoured.  Treatment planning then occurred.  The radiation prescription was entered and confirmed.  A total of two complex treatment devices were fabricated in the form of the BodyFix immobilization pillow and a neck accuform cushion.  I have requested : 3D Simulation  I have requested a DVH of the following structures: Heart, Lungs, Esophagus, Chest Wall, Brachial Plexus, Major Blood Vessels, and targets.  SPECIAL TREATMENT PROCEDURE:  The planned course of therapy using radiation constitutes a special treatment procedure. Special care is required in  the management of this patient for the following reasons. This treatment constitutes a Special Treatment Procedure for the following reason: [ High dose per fraction requiring special monitoring for increased toxicities of treatment including daily imaging..  The special nature of the planned course of radiotherapy will require increased physician supervision and oversight to ensure patient's safety with optimal treatment outcomes.  This requires extended time and effort.    RESPIRATORY MOTION MANAGEMENT SIMULATION:  In order to account for effect of respiratory motion on target structures and other organs in the planning and delivery of radiotherapy, this patient underwent respiratory motion management simulation.  To accomplish this, when the patient was brought to the CT simulation planning suite, 4D respiratoy motion management CT images were obtained.  The CT images were loaded into the planning software.  Then, using a variety of tools including Cine, MIP, and standard views, the target volume and planning target volumes (PTV) were delineated.  Avoidance structures were contoured.  Treatment planning then occurred.  Dose volume histograms were generated and reviewed for each of the requested structure.  The resulting plan was carefully reviewed and approved today.  PLAN:  The patient will receive 60 Gy in 5 fractions.  ________________________________  Artist Pais Kathrynn Running, M.D.

## 2023-10-03 ENCOUNTER — Other Ambulatory Visit: Payer: Self-pay

## 2023-10-03 ENCOUNTER — Other Ambulatory Visit (HOSPITAL_COMMUNITY): Payer: Self-pay

## 2023-10-04 ENCOUNTER — Other Ambulatory Visit (HOSPITAL_COMMUNITY): Payer: Self-pay

## 2023-10-04 ENCOUNTER — Other Ambulatory Visit: Payer: Self-pay | Admitting: Pharmacist

## 2023-10-04 DIAGNOSIS — C3431 Malignant neoplasm of lower lobe, right bronchus or lung: Secondary | ICD-10-CM

## 2023-10-04 DIAGNOSIS — Z95828 Presence of other vascular implants and grafts: Secondary | ICD-10-CM

## 2023-10-04 DIAGNOSIS — C3491 Malignant neoplasm of unspecified part of right bronchus or lung: Secondary | ICD-10-CM

## 2023-10-04 MED ORDER — ERLOTINIB HCL 150 MG PO TABS: 150.0000 mg | ORAL_TABLET | Freq: Every day | ORAL | 6 refills | Status: DC

## 2023-10-04 NOTE — Progress Notes (Signed)
Oral Oncology Pharmacist Encounter   Patient has exhausted current NSCLC grant funding, thus is going to change filling erlotinib Rx from [facility] to [facility] as cash pricing of medication is more affordable for patient.   Remaining erlotinib refills have been redirected for dispensing.    Lenord Carbo, PharmD, BCPS, [facility] Hematology/Oncology Clinical Pharmacist [facility] Oral Chemotherapy Navigation Clinics [phone] 10/04/2023 3:42 PM

## 2023-10-04 NOTE — Progress Notes (Signed)
Oral Oncology Patient Advocate Encounter  Patient PANF grant has been exhausted and no additional funds are available for her disease state at this time. Patient's son, Ra, has opted to return to filling the rx with [facility] due to cost.  Jinger Neighbors, CPhT-Adv Oncology Pharmacy Patient Advocate [facility] Direct Number: [phone]  Fax: [phone]

## 2023-10-05 ENCOUNTER — Encounter: Payer: Self-pay | Admitting: Internal Medicine

## 2023-10-12 DIAGNOSIS — C3431 Malignant neoplasm of lower lobe, right bronchus or lung: Secondary | ICD-10-CM | POA: Diagnosis not present

## 2023-10-12 DIAGNOSIS — Z510 Encounter for antineoplastic radiation therapy: Secondary | ICD-10-CM | POA: Diagnosis not present

## 2023-10-12 DIAGNOSIS — C3411 Malignant neoplasm of upper lobe, right bronchus or lung: Secondary | ICD-10-CM | POA: Diagnosis not present

## 2023-10-16 ENCOUNTER — Telehealth: Payer: Self-pay

## 2023-10-16 ENCOUNTER — Other Ambulatory Visit: Payer: Self-pay

## 2023-10-16 ENCOUNTER — Other Ambulatory Visit: Payer: Self-pay | Admitting: Internal Medicine

## 2023-10-16 ENCOUNTER — Ambulatory Visit
Admission: RE | Admit: 2023-10-16 | Discharge: 2023-10-16 | Disposition: A | Discharge status: Home / Self Care | Payer: Medicare Other | Source: Ambulatory Visit | Attending: Radiation Oncology | Admitting: Radiation Oncology

## 2023-10-16 DIAGNOSIS — C3411 Malignant neoplasm of upper lobe, right bronchus or lung: Secondary | ICD-10-CM | POA: Diagnosis not present

## 2023-10-16 DIAGNOSIS — Z510 Encounter for antineoplastic radiation therapy: Secondary | ICD-10-CM | POA: Diagnosis not present

## 2023-10-16 LAB — RAD ONC ARIA SESSION SUMMARY
Course Elapsed Days: 0
Plan Fractions Treated to Date: 1
Plan Prescribed Dose Per Fraction: 12 Gy
Plan Total Fractions Prescribed: 5
Plan Total Prescribed Dose: 60 Gy
Reference Point Dosage Given to Date: 12 Gy
Reference Point Session Dosage Given: 12 Gy
Session Number: 1

## 2023-10-17 ENCOUNTER — Ambulatory Visit: Payer: Medicare Other

## 2023-10-18 ENCOUNTER — Ambulatory Visit
Admission: RE | Admit: 2023-10-18 | Discharge: 2023-10-18 | Disposition: A | Discharge status: Home / Self Care | Payer: Medicare Other | Source: Ambulatory Visit | Attending: Radiation Oncology | Admitting: Radiation Oncology

## 2023-10-18 ENCOUNTER — Other Ambulatory Visit: Payer: Self-pay

## 2023-10-18 ENCOUNTER — Institutional Professional Consult (permissible substitution): Payer: Medicare Other | Admitting: Pulmonary Disease

## 2023-10-18 DIAGNOSIS — C3411 Malignant neoplasm of upper lobe, right bronchus or lung: Secondary | ICD-10-CM | POA: Diagnosis not present

## 2023-10-18 DIAGNOSIS — Z510 Encounter for antineoplastic radiation therapy: Secondary | ICD-10-CM | POA: Diagnosis not present

## 2023-10-18 LAB — RAD ONC ARIA SESSION SUMMARY
Course Elapsed Days: 2
Plan Fractions Treated to Date: 2
Plan Prescribed Dose Per Fraction: 12 Gy
Plan Total Fractions Prescribed: 5
Plan Total Prescribed Dose: 60 Gy
Reference Point Dosage Given to Date: 24 Gy
Reference Point Session Dosage Given: 12 Gy
Session Number: 2

## 2023-10-19 ENCOUNTER — Ambulatory Visit: Payer: Medicare Other

## 2023-10-20 ENCOUNTER — Emergency Department (HOSPITAL_COMMUNITY)
Admission: EM | Admit: 2023-10-20 | Discharge: 2023-10-21 | Disposition: A | Discharge status: Home / Self Care | Payer: Medicare Other | Attending: Emergency Medicine | Admitting: Emergency Medicine

## 2023-10-20 ENCOUNTER — Telehealth: Payer: Self-pay | Admitting: Radiation Oncology

## 2023-10-20 ENCOUNTER — Emergency Department (HOSPITAL_COMMUNITY): Payer: Medicare Other

## 2023-10-20 ENCOUNTER — Other Ambulatory Visit: Payer: Self-pay

## 2023-10-20 ENCOUNTER — Ambulatory Visit: Payer: Medicare Other

## 2023-10-20 ENCOUNTER — Encounter (HOSPITAL_COMMUNITY): Payer: Self-pay

## 2023-10-20 DIAGNOSIS — R791 Abnormal coagulation profile: Secondary | ICD-10-CM | POA: Diagnosis not present

## 2023-10-20 DIAGNOSIS — R052 Subacute cough: Secondary | ICD-10-CM

## 2023-10-20 DIAGNOSIS — J929 Pleural plaque without asbestos: Secondary | ICD-10-CM | POA: Diagnosis not present

## 2023-10-20 DIAGNOSIS — I10 Essential (primary) hypertension: Secondary | ICD-10-CM | POA: Insufficient documentation

## 2023-10-20 DIAGNOSIS — Z85118 Personal history of other malignant neoplasm of bronchus and lung: Secondary | ICD-10-CM | POA: Diagnosis not present

## 2023-10-20 DIAGNOSIS — R059 Cough, unspecified: Secondary | ICD-10-CM | POA: Diagnosis not present

## 2023-10-20 DIAGNOSIS — J90 Pleural effusion, not elsewhere classified: Secondary | ICD-10-CM | POA: Diagnosis not present

## 2023-10-20 DIAGNOSIS — Z79899 Other long term (current) drug therapy: Secondary | ICD-10-CM | POA: Insufficient documentation

## 2023-10-20 DIAGNOSIS — Z20822 Contact with and (suspected) exposure to covid-19: Secondary | ICD-10-CM | POA: Diagnosis not present

## 2023-10-20 DIAGNOSIS — R509 Fever, unspecified: Secondary | ICD-10-CM | POA: Diagnosis not present

## 2023-10-20 DIAGNOSIS — J181 Lobar pneumonia, unspecified organism: Secondary | ICD-10-CM | POA: Insufficient documentation

## 2023-10-20 DIAGNOSIS — R918 Other nonspecific abnormal finding of lung field: Secondary | ICD-10-CM | POA: Diagnosis not present

## 2023-10-20 DIAGNOSIS — J168 Pneumonia due to other specified infectious organisms: Secondary | ICD-10-CM | POA: Diagnosis not present

## 2023-10-20 DIAGNOSIS — J189 Pneumonia, unspecified organism: Secondary | ICD-10-CM

## 2023-10-20 LAB — URINALYSIS, W/ REFLEX TO CULTURE (INFECTION SUSPECTED)
Bacteria, UA: NONE SEEN
Bilirubin Urine: NEGATIVE
Glucose, UA: NEGATIVE mg/dL
Hgb urine dipstick: NEGATIVE
Ketones, ur: NEGATIVE mg/dL
Nitrite: NEGATIVE
Protein, ur: NEGATIVE mg/dL
Specific Gravity, Urine: 1.001 — ABNORMAL LOW (ref 1.005–1.030)
pH: 8 (ref 5.0–8.0)

## 2023-10-20 LAB — COMPREHENSIVE METABOLIC PANEL
ALT: 26 U/L (ref 0–44)
AST: 42 U/L — ABNORMAL HIGH (ref 15–41)
Albumin: 3.4 g/dL — ABNORMAL LOW (ref 3.5–5.0)
Alkaline Phosphatase: 90 U/L (ref 38–126)
Anion gap: 9 (ref 5–15)
BUN: <5 mg/dL — ABNORMAL LOW (ref 8–23)
CO2: 26 mmol/L (ref 22–32)
Calcium: 8.3 mg/dL — ABNORMAL LOW (ref 8.9–10.3)
Chloride: 101 mmol/L (ref 98–111)
Creatinine, Ser: 0.42 mg/dL — ABNORMAL LOW (ref 0.44–1.00)
GFR, Estimated: >60 mL/min (ref >60)
Glucose, Bld: 108 mg/dL — ABNORMAL HIGH (ref 70–99)
Potassium: 4 mmol/L (ref 3.5–5.1)
Sodium: 136 mmol/L (ref 135–145)
Total Bilirubin: 1.8 mg/dL — ABNORMAL HIGH (ref <1.2)
Total Protein: 7.1 g/dL (ref 6.5–8.1)

## 2023-10-20 LAB — CBC WITH DIFFERENTIAL/PLATELET
Abs Immature Granulocytes: 0.04 10*3/uL (ref 0.00–0.07)
Basophils Absolute: 0 10*3/uL (ref 0.0–0.1)
Basophils Relative: 0 %
Eosinophils Absolute: 0.1 10*3/uL (ref 0.0–0.5)
Eosinophils Relative: 1 %
HCT: 36 % (ref 36.0–46.0)
Hemoglobin: 11.9 g/dL — ABNORMAL LOW (ref 12.0–15.0)
Immature Granulocytes: 0 %
Lymphocytes Relative: 10 %
Lymphs Abs: 1 10*3/uL (ref 0.7–4.0)
MCH: 30.1 pg (ref 26.0–34.0)
MCHC: 33.1 g/dL (ref 30.0–36.0)
MCV: 91.1 fL (ref 80.0–100.0)
Monocytes Absolute: 1 10*3/uL (ref 0.1–1.0)
Monocytes Relative: 10 %
Neutro Abs: 7.8 10*3/uL — ABNORMAL HIGH (ref 1.7–7.7)
Neutrophils Relative %: 79 %
Platelets: 249 10*3/uL (ref 150–400)
RBC: 3.95 MIL/uL (ref 3.87–5.11)
RDW: 14.2 % (ref 11.5–15.5)
WBC: 10 10*3/uL (ref 4.0–10.5)
nRBC: 0 % (ref 0.0–0.2)

## 2023-10-20 LAB — PROTIME-INR
INR: 1 (ref 0.8–1.2)
Prothrombin Time: 12.9 s (ref 11.4–15.2)

## 2023-10-20 LAB — APTT: aPTT: 33 s (ref 24–36)

## 2023-10-20 LAB — I-STAT CG4 LACTIC ACID, ED: Lactic Acid, Venous: 1.4 mmol/L (ref 0.5–1.9)

## 2023-10-20 MED ORDER — IOHEXOL 300 MG/ML  SOLN
75.0000 mL | Freq: Once | INTRAMUSCULAR | Status: AC | PRN
  Administered 2023-10-20: 75 mL via INTRAVENOUS

## 2023-10-20 MED ORDER — HYDROCOD POLI-CHLORPHE POLI ER 10-8 MG/5ML PO SUER
5.0000 mL | Freq: Once | ORAL | Status: AC
  Administered 2023-10-21: 5 mL via ORAL
  Filled 2023-10-20: qty 5

## 2023-10-20 NOTE — ED Provider Notes (Signed)
[place] EMERGENCY DEPARTMENT AT [facility] Provider Note  CSN: [id number] Arrival date & time: 10/20/23 2129  Chief Complaint(s) Fever and Cough  HPI [name] is a 62 y.o. female {Add pertinent medical, surgical, social history, OB history to HPI:1}    Fever Temp source:  Subjective Severity:  Moderate Onset quality:  Gradual Duration:  2 days Timing:  Constant Progression:  Worsening Chronicity:  New Associated symptoms: chills, cough, myalgias and sore throat   Cough Associated symptoms: chills, fever, myalgias and sore throat     Past Medical History Past Medical History:  Diagnosis Date   Drug-induced skin rash 02/27/2017   Encounter for antineoplastic chemotherapy 01/19/2016   History of radiation therapy 05/02/11 to 06/08/11   right lung   Lung cancer [facility])    right lower lobe adenocarcinoma   Patient Active Problem List   Diagnosis Date Noted   Pulmonary nodule 09/05/2023   Lung mass 08/21/2023   Acute pharyngitis 06/19/2023   Seasonal allergic rhinitis due to pollen 06/15/2017   Essential hypertension 05/23/2017   Atrial arrhythmia 05/21/2017   Port catheter in place 12/27/2016   Encounter for antineoplastic chemotherapy 06/13/2016   GERD (gastroesophageal reflux disease) 04/18/2016   Primary cancer of right lower lobe of lung (HCC) 05/06/2009   LEIOMYOMA, UTERUS 05/06/2009   Home Medication(s) Prior to Admission medications   Medication Sig Start Date End Date Taking? Authorizing Provider  albuterol (PROAIR HFA) 108 (90 Base) MCG/ACT inhaler Inhale 1-2 puffs into the lungs every 6 (six) hours as needed for wheezing or shortness of breath. 06/19/23   Sagardia, [name], MD  chlorpheniramine-HYDROcodone (TUSSIONEX) 10-8 MG/5ML Take 5 mLs by mouth every 12 (twelve) hours as needed for cough. 09/21/23   Heilingoetter, Cassandra L, PA-C  clindamycin (CLINDAGEL) 1 % gel APPLY TO AFFECTED AREA 2 (TWO) TIMES DAILY. 10/16/23   Si Gaul, MD   diltiazem (TIADYLT ER) 120 MG 24 hr capsule Take 1 capsule by mouth daily. 07/19/23   Duke Salvia, MD  doxycycline (VIBRA-TABS) 100 MG tablet Take 1 tablet (100 mg total) by mouth 2 (two) times daily. 09/18/23   Si Gaul, MD  erlotinib (TARCEVA) 150 MG tablet Take 1 tablet (150 mg total) by mouth daily. Take on an empty stomach at least 1 hour before or 2 hours after food. 10/04/23   Si Gaul, MD                                                                                                                                    Allergies Codeine and Doxycycline  Review of Systems Review of Systems  Constitutional:  Positive for chills and fever.  HENT:  Positive for sore throat.   Respiratory:  Positive for cough.   Musculoskeletal:  Positive for myalgias.   As noted in HPI  Physical Exam Vital Signs  I have reviewed the triage vital signs BP 123/72  Pulse 80   Temp 99.4 F (37.4 C)   Resp 14   Ht 4' (1.219 m)   Wt 52.8 kg   LMP 02/10/2012   SpO2 99%   BMI 35.52 kg/m  *** Physical Exam  ED Results and Treatments Labs (all labs ordered are listed, but only abnormal results are displayed) Labs Reviewed  CBC WITH DIFFERENTIAL/PLATELET - Abnormal; Notable for the following components:      Result Value   Hemoglobin 11.9 (*)    Neutro Abs 7.8 (*)    All other components within normal limits  URINALYSIS, W/ REFLEX TO CULTURE (INFECTION SUSPECTED) - Abnormal; Notable for the following components:   Color, Urine STRAW (*)    Specific Gravity, Urine 1.001 (*)    Leukocytes,Ua TRACE (*)    All other components within normal limits  CULTURE, BLOOD (ROUTINE X 2)  CULTURE, BLOOD (ROUTINE X 2)  RESP PANEL BY RT-PCR (RSV, FLU A&B, COVID)  [id number]  PROTIME-INR  APTT  COMPREHENSIVE METABOLIC PANEL  I-STAT CG4 LACTIC ACID, ED  I-STAT CG4 LACTIC ACID, ED                                                                                                                          EKG  EKG Interpretation Date/Time:    Ventricular Rate:    PR Interval:    QRS Duration:    QT Interval:    QTC Calculation:   R Axis:      Text Interpretation:         Radiology DG Chest Port 1 View  Result Date: 10/20/2023 CLINICAL DATA:  Questionable sepsis, fever, cough, sore throat EXAM: PORTABLE CHEST 1 VIEW COMPARISON:  09/05/2023 FINDINGS: Stable cardiomediastinal silhouette. Left chest wall Port-A-Cath with tip at the superior cavoatrial junction. The left lung is clear. The nodular opacity in the right lung near the fiducial marker seen previously is not visualized today. Similar small right pleural effusion and associated airspace opacities. IMPRESSION: Similar small right pleural effusion and associated airspace opacities. Pneumonia is difficult to exclude. The previous nodular opacity associated with the fiducial marker in the right lung is no longer visualized. Electronically Signed   By: Minerva Fester M.D.   On: 10/20/2023 22:29    Medications Ordered in ED Medications - No data to display Procedures Procedures  (including critical care time) Medical Decision Making / ED Course   Medical Decision Making   ***    Final Clinical Impression(s) / ED Diagnoses Final diagnoses:  None    This chart was dictated using voice recognition software.  Despite best efforts to proofread,  errors can occur which can change the documentation meaning.

## 2023-10-20 NOTE — Telephone Encounter (Signed)
11/22 @ 8:22 am patient's daughter called to cancel patient's treatment appointment for today due to still being sick.  Email sent to Support RTT and copied L1 machine, so they are aware.

## 2023-10-20 NOTE — ED Triage Notes (Signed)
Pt reports with fever, cough, sore throat, and elevated hr since Tuesday. Pt received radiation tx on Wednesday.

## 2023-10-21 ENCOUNTER — Encounter: Payer: Self-pay | Admitting: Internal Medicine

## 2023-10-21 ENCOUNTER — Emergency Department (HOSPITAL_COMMUNITY): Payer: Medicare Other

## 2023-10-21 ENCOUNTER — Other Ambulatory Visit (HOSPITAL_COMMUNITY): Payer: Self-pay

## 2023-10-21 DIAGNOSIS — J929 Pleural plaque without asbestos: Secondary | ICD-10-CM | POA: Diagnosis not present

## 2023-10-21 DIAGNOSIS — R918 Other nonspecific abnormal finding of lung field: Secondary | ICD-10-CM | POA: Diagnosis not present

## 2023-10-21 DIAGNOSIS — J90 Pleural effusion, not elsewhere classified: Secondary | ICD-10-CM | POA: Diagnosis not present

## 2023-10-21 LAB — I-STAT CG4 LACTIC ACID, ED: Lactic Acid, Venous: 0.9 mmol/L (ref 0.5–1.9)

## 2023-10-21 LAB — RESP PANEL BY RT-PCR (RSV, FLU A&B, COVID)  RVPGX2
Influenza A by PCR: NEGATIVE
Influenza B by PCR: NEGATIVE
Resp Syncytial Virus by PCR: NEGATIVE
SARS Coronavirus 2 by RT PCR: NEGATIVE

## 2023-10-21 MED ORDER — IBUPROFEN 600 MG PO TABS
600.0000 mg | ORAL_TABLET | Freq: Four times a day (QID) | ORAL | 0 refills | Status: DC | PRN
  Filled 2023-10-21: qty 30, 8d supply, fill #0

## 2023-10-21 MED ORDER — AMOXICILLIN-POT CLAVULANATE 875-125 MG PO TABS
1.0000 | ORAL_TABLET | Freq: Two times a day (BID) | ORAL | 0 refills | Status: DC
  Filled 2023-10-21: qty 14, 7d supply, fill #0

## 2023-10-21 MED ORDER — HYDROCOD POLI-CHLORPHE POLI ER 10-8 MG/5ML PO SUER
5.0000 mL | Freq: Two times a day (BID) | ORAL | 0 refills | Status: DC | PRN
  Filled 2023-10-21: qty 115, 12d supply, fill #0

## 2023-10-22 ENCOUNTER — Other Ambulatory Visit: Payer: Self-pay

## 2023-10-22 ENCOUNTER — Encounter (HOSPITAL_COMMUNITY): Payer: Self-pay

## 2023-10-22 ENCOUNTER — Observation Stay (HOSPITAL_COMMUNITY)
Admission: EM | Admit: 2023-10-22 | Discharge: 2023-10-23 | Disposition: A | Discharge status: Home / Self Care | Payer: Medicare Other | Attending: Internal Medicine | Admitting: Internal Medicine

## 2023-10-22 ENCOUNTER — Emergency Department (HOSPITAL_COMMUNITY): Payer: Medicare Other

## 2023-10-22 DIAGNOSIS — C3490 Malignant neoplasm of unspecified part of unspecified bronchus or lung: Secondary | ICD-10-CM | POA: Diagnosis not present

## 2023-10-22 DIAGNOSIS — Z79899 Other long term (current) drug therapy: Secondary | ICD-10-CM | POA: Insufficient documentation

## 2023-10-22 DIAGNOSIS — R0989 Other specified symptoms and signs involving the circulatory and respiratory systems: Secondary | ICD-10-CM | POA: Diagnosis not present

## 2023-10-22 DIAGNOSIS — B957 Other staphylococcus as the cause of diseases classified elsewhere: Secondary | ICD-10-CM | POA: Diagnosis not present

## 2023-10-22 DIAGNOSIS — J00 Acute nasopharyngitis [common cold]: Secondary | ICD-10-CM | POA: Diagnosis not present

## 2023-10-22 DIAGNOSIS — R7881 Bacteremia: Secondary | ICD-10-CM | POA: Diagnosis not present

## 2023-10-22 DIAGNOSIS — E876 Hypokalemia: Secondary | ICD-10-CM | POA: Diagnosis not present

## 2023-10-22 DIAGNOSIS — I10 Essential (primary) hypertension: Secondary | ICD-10-CM | POA: Diagnosis not present

## 2023-10-22 DIAGNOSIS — Z4682 Encounter for fitting and adjustment of non-vascular catheter: Secondary | ICD-10-CM | POA: Diagnosis not present

## 2023-10-22 DIAGNOSIS — T25021A Burn of unspecified degree of right foot, initial encounter: Secondary | ICD-10-CM | POA: Insufficient documentation

## 2023-10-22 DIAGNOSIS — X118XXA Contact with other hot tap-water, initial encounter: Secondary | ICD-10-CM | POA: Insufficient documentation

## 2023-10-22 DIAGNOSIS — T300 Burn of unspecified body region, unspecified degree: Secondary | ICD-10-CM | POA: Insufficient documentation

## 2023-10-22 DIAGNOSIS — R059 Cough, unspecified: Secondary | ICD-10-CM | POA: Diagnosis not present

## 2023-10-22 DIAGNOSIS — J90 Pleural effusion, not elsewhere classified: Secondary | ICD-10-CM | POA: Diagnosis not present

## 2023-10-22 DIAGNOSIS — R799 Abnormal finding of blood chemistry, unspecified: Secondary | ICD-10-CM | POA: Diagnosis present

## 2023-10-22 LAB — RESPIRATORY PANEL BY PCR

## 2023-10-22 LAB — CBC WITH DIFFERENTIAL/PLATELET
Abs Immature Granulocytes: 0.05 10*3/uL (ref 0.00–0.07)
Basophils Absolute: 0 10*3/uL (ref 0.0–0.1)
Basophils Relative: 0 %
Eosinophils Absolute: 0.2 10*3/uL (ref 0.0–0.5)
Eosinophils Relative: 2 %
HCT: 36.9 % (ref 36.0–46.0)
Hemoglobin: 12 g/dL (ref 12.0–15.0)
Immature Granulocytes: 1 %
Lymphocytes Relative: 12 %
Lymphs Abs: 1.2 10*3/uL (ref 0.7–4.0)
MCH: 29.7 pg (ref 26.0–34.0)
MCHC: 32.5 g/dL (ref 30.0–36.0)
MCV: 91.3 fL (ref 80.0–100.0)
Monocytes Absolute: 1.2 10*3/uL — ABNORMAL HIGH (ref 0.1–1.0)
Monocytes Relative: 11 %
Neutro Abs: 7.7 10*3/uL (ref 1.7–7.7)
Neutrophils Relative %: 74 %
Platelets: 279 10*3/uL (ref 150–400)
RBC: 4.04 MIL/uL (ref 3.87–5.11)
RDW: 13.9 % (ref 11.5–15.5)
WBC: 10.4 10*3/uL (ref 4.0–10.5)
nRBC: 0 % (ref 0.0–0.2)

## 2023-10-22 LAB — BLOOD CULTURE ID PANEL (REFLEXED) - BCID2

## 2023-10-22 LAB — HIV ANTIBODY (ROUTINE TESTING W REFLEX): HIV Screen 4th Generation wRfx: NONREACTIVE

## 2023-10-22 LAB — EXPECTORATED SPUTUM ASSESSMENT W GRAM STAIN, RFLX TO RESP C

## 2023-10-22 LAB — COMPREHENSIVE METABOLIC PANEL
ALT: 29 U/L (ref 0–44)
AST: 31 U/L (ref 15–41)
Albumin: 3.4 g/dL — ABNORMAL LOW (ref 3.5–5.0)
Alkaline Phosphatase: 87 U/L (ref 38–126)
Anion gap: 7 (ref 5–15)
BUN: 8 mg/dL (ref 8–23)
CO2: 26 mmol/L (ref 22–32)
Calcium: 8.5 mg/dL — ABNORMAL LOW (ref 8.9–10.3)
Chloride: 101 mmol/L (ref 98–111)
Creatinine, Ser: 0.52 mg/dL (ref 0.44–1.00)
GFR, Estimated: >60 mL/min (ref >60)
Glucose, Bld: 103 mg/dL — ABNORMAL HIGH (ref 70–99)
Potassium: 3.2 mmol/L — ABNORMAL LOW (ref 3.5–5.1)
Sodium: 134 mmol/L — ABNORMAL LOW (ref 135–145)
Total Bilirubin: 1 mg/dL (ref <1.2)
Total Protein: 7 g/dL (ref 6.5–8.1)

## 2023-10-22 LAB — LACTIC ACID, PLASMA
Lactic Acid, Venous: 1.2 mmol/L (ref 0.5–1.9)
Lactic Acid, Venous: 0.6 mmol/L (ref 0.5–1.9)

## 2023-10-22 LAB — STREP PNEUMONIAE URINARY ANTIGEN: Strep Pneumo Urinary Antigen: NEGATIVE

## 2023-10-22 LAB — MRSA NEXT GEN BY PCR, NASAL: MRSA by PCR Next Gen: NOT DETECTED

## 2023-10-22 MED ORDER — ACETAMINOPHEN 650 MG RE SUPP: 650.0000 mg | Freq: Four times a day (QID) | RECTAL | Status: DC | PRN

## 2023-10-22 MED ORDER — VANCOMYCIN HCL 500 MG/100ML IV SOLN: 500.0000 mg | INTRAVENOUS | Status: DC

## 2023-10-22 MED ORDER — DILTIAZEM HCL ER BEADS 120 MG PO CP24
120.0000 mg | ORAL_CAPSULE | Freq: Every day | ORAL | Status: DC
  Administered 2023-10-22 – 2023-10-23 (×2): 120 mg via ORAL
  Filled 2023-10-22 (×3): qty 1

## 2023-10-22 MED ORDER — ONDANSETRON HCL 4 MG/2ML IJ SOLN: 4.0000 mg | Freq: Four times a day (QID) | INTRAMUSCULAR | Status: DC | PRN

## 2023-10-22 MED ORDER — ENOXAPARIN SODIUM 40 MG/0.4ML IJ SOSY
40.0000 mg | PREFILLED_SYRINGE | INTRAMUSCULAR | Status: DC
  Administered 2023-10-22 – 2023-10-23 (×2): 40 mg via SUBCUTANEOUS
  Filled 2023-10-22 (×2): qty 0.4

## 2023-10-22 MED ORDER — ACETAMINOPHEN 325 MG PO TABS: 650.0000 mg | ORAL_TABLET | Freq: Four times a day (QID) | ORAL | Status: DC | PRN

## 2023-10-22 MED ORDER — POTASSIUM CHLORIDE CRYS ER 20 MEQ PO TBCR
40.0000 meq | EXTENDED_RELEASE_TABLET | Freq: Once | ORAL | Status: AC
  Administered 2023-10-22: 40 meq via ORAL
  Filled 2023-10-22: qty 2

## 2023-10-22 MED ORDER — ALBUTEROL SULFATE (2.5 MG/3ML) 0.083% IN NEBU: 2.5000 mg | INHALATION_SOLUTION | RESPIRATORY_TRACT | Status: DC | PRN

## 2023-10-22 MED ORDER — ONDANSETRON HCL 4 MG PO TABS: 4.0000 mg | ORAL_TABLET | Freq: Four times a day (QID) | ORAL | Status: DC | PRN

## 2023-10-22 MED ORDER — SODIUM CHLORIDE 0.9 % IV SOLN
2.0000 g | INTRAVENOUS | Status: DC
  Administered 2023-10-23: 2 g via INTRAVENOUS
  Filled 2023-10-22: qty 20

## 2023-10-22 MED ORDER — SODIUM CHLORIDE 0.9 % IV SOLN
2.0000 g | Freq: Once | INTRAVENOUS | Status: AC
  Administered 2023-10-22: 2 g via INTRAVENOUS
  Filled 2023-10-22: qty 20

## 2023-10-22 MED ORDER — BENZONATATE 100 MG PO CAPS: 100.0000 mg | ORAL_CAPSULE | Freq: Three times a day (TID) | ORAL | Status: DC | PRN

## 2023-10-22 MED ORDER — TRAZODONE HCL 50 MG PO TABS: 25.0000 mg | ORAL_TABLET | Freq: Every evening | ORAL | Status: DC | PRN

## 2023-10-22 MED ORDER — HYDROCOD POLI-CHLORPHE POLI ER 10-8 MG/5ML PO SUER
5.0000 mL | Freq: Two times a day (BID) | ORAL | Status: DC | PRN
  Administered 2023-10-22: 5 mL via ORAL
  Filled 2023-10-22: qty 5

## 2023-10-22 MED ORDER — VANCOMYCIN HCL IN DEXTROSE 1-5 GM/200ML-% IV SOLN
1000.0000 mg | Freq: Once | INTRAVENOUS | Status: AC
  Administered 2023-10-22: 1000 mg via INTRAVENOUS
  Filled 2023-10-22: qty 200

## 2023-10-22 NOTE — ED Provider Notes (Signed)
[place] EMERGENCY DEPARTMENT AT [facility] Provider Note   CSN: [id number] Arrival date & time: 10/22/23  6213     History  Chief Complaint  Patient presents with   Cough    [name] is a 62 y.o. female.  62 year old female brought in by family after call from the hospital for positive blood culture. Patient was seen here 10/20/23 with fever, cough, sore throat. Diagnosed with pneumonia and discharged on Augmentin. Patient has had 1 dose of her antibiotic, is taking the cough medicine. States no fevers, continues to have a cough but feels like the cough medicine helps with this. History of lung cancer, on radiation, next dose is scheduled for Monday, history of right lower lobectomy (03/2009).        Home Medications Prior to Admission medications   Medication Sig Start Date End Date Taking? Authorizing Provider  albuterol (PROAIR HFA) 108 (90 Base) MCG/ACT inhaler Inhale 1-2 puffs into the lungs every 6 (six) hours as needed for wheezing or shortness of breath. 06/19/23   [name], MD  amoxicillin-clavulanate (AUGMENTIN) 875-125 MG tablet Take 1 tablet by mouth every 12 (twelve) hours. 10/21/23   Nira Conn, MD  chlorpheniramine-HYDROcodone (TUSSIONEX) 10-8 MG/5ML Take 5 mLs by mouth every 12 (twelve) hours as needed for cough. 10/21/23   Cardama, Amadeo Garnet, MD  clindamycin (CLINDAGEL) 1 % gel APPLY TO AFFECTED AREA 2 (TWO) TIMES DAILY. 10/16/23   Si Gaul, MD  diltiazem (TIADYLT ER) 120 MG 24 hr capsule Take 1 capsule by mouth daily. 07/19/23   Duke Salvia, MD  erlotinib (TARCEVA) 150 MG tablet Take 1 tablet (150 mg total) by mouth daily. Take on an empty stomach at least 1 hour before or 2 hours after food. 10/04/23   Si Gaul, MD  ibuprofen (ADVIL) 600 MG tablet Take 1 tablet (600 mg total) by mouth every 6 (six) hours as needed for fever or mild pain (pain score 1-3). 10/21/23   Nira Conn, MD       Allergies    Codeine and Doxycycline    Review of Systems   Review of Systems Negative except as per HPI Physical Exam Updated Vital Signs BP 111/82 (BP Location: Left Arm)   Pulse 88   Temp 98.6 F (37 C) (Oral)   Resp 19   Ht 4\' 5"  (1.346 m)   Wt 52.2 kg   LMP 02/10/2012   SpO2 97%   BMI 28.78 kg/m  Physical Exam Vitals and nursing note reviewed.  Constitutional:      General: She is not in acute distress.    Appearance: She is well-developed. She is not diaphoretic.  HENT:     Head: Normocephalic and atraumatic.  Cardiovascular:     Rate and Rhythm: Normal rate and regular rhythm.     Pulses: Normal pulses.     Heart sounds: Murmur heard.  Pulmonary:     Effort: Pulmonary effort is normal.     Breath sounds: Rales present.  Abdominal:     Palpations: Abdomen is soft.  Musculoskeletal:     Right lower leg: No edema.     Left lower leg: No edema.  Skin:    General: Skin is warm and dry.     Findings: No erythema or rash.  Neurological:     Mental Status: She is alert and oriented to person, place, and time.  Psychiatric:        Behavior: Behavior normal.  ED Results / Procedures / Treatments   Labs (all labs ordered are listed, but only abnormal results are displayed) Labs Reviewed  CBC WITH DIFFERENTIAL/PLATELET - Abnormal; Notable for the following components:      Result Value   Monocytes Absolute 1.2 (*)    All other components within normal limits  COMPREHENSIVE METABOLIC PANEL - Abnormal; Notable for the following components:   Sodium 134 (*)    Potassium 3.2 (*)    Glucose, Bld 103 (*)    Calcium 8.5 (*)    Albumin 3.4 (*)    All other components within normal limits  CULTURE, BLOOD (ROUTINE X 2)  CULTURE, BLOOD (ROUTINE X 2)  EXPECTORATED SPUTUM ASSESSMENT W GRAM STAIN, RFLX TO RESP C  RESPIRATORY PANEL BY PCR  LACTIC ACID, PLASMA  LACTIC ACID, PLASMA    EKG None  Radiology DG Chest 2 View  Result Date: 10/22/2023 CLINICAL  DATA:  Cough.  Positive blood cultures. EXAM: CHEST - 2 VIEW COMPARISON:  10/21/2023 FINDINGS: There is a left chest wall port a catheter with tip at the superior cavoatrial junction. Lung volumes are low. Unchanged small to moderate right pleural effusion. Decreased aeration to the right lung base compatible suspected recurrence of tumor within the right mid and right lower lung as demonstrated on CT from prior day. Mild increase interstitial markings. Left lung is clear. IMPRESSION: 1. Unchanged small to moderate right pleural effusion. 2. Decreased aeration to the right lung base compatible with suspected recurrence of tumor within the right mid and right lower lung as demonstrated on CT from prior day. 3. Suspect mild interstitial edema. Electronically Signed   By: Signa Kell M.D.   On: 10/22/2023 06:32   CT Chest W Contrast  Result Date: 10/21/2023 CLINICAL DATA:  Pneumonia, complication suspected. EXAM: CT CHEST WITH CONTRAST TECHNIQUE: Multidetector CT imaging of the chest was performed during intravenous contrast administration. RADIATION DOSE REDUCTION: This exam was performed according to the departmental dose-optimization program which includes automated exposure control, adjustment of the mA and/or kV according to patient size and/or use of iterative reconstruction technique. CONTRAST:  75mL OMNIPAQUE IOHEXOL 300 MG/ML  SOLN COMPARISON:  08/07/2023, 03/31/2023, 10/20/2023. FINDINGS: Cardiovascular: The heart is enlarged and there is a trace pericardial effusion. There is atherosclerotic calcification of the aorta without evidence of aneurysm. The pulmonary trunk is distended suggesting underlying pulmonary artery hypertension. A left chest port terminates in the superior vena cava. Mediastinum/Nodes: A prominent lymph node is noted in the prevascular space on the left measuring 1 cm no hilar or axillary lymphadenopathy. There is a stable 1.9 cm hypodense nodule in the right lobe of the thyroid  gland. The trachea and esophagus are within normal limits. Lungs/Pleura: A small loculated pleural effusion is noted on the right with a associated mild pleural thickening. Right lower lobectomy changes are noted. Increased consolidation is noted in the right lung. There is redemonstration of a subsolid nodule in the right upper lobe measuring 2 cm, versus 1.6 cm previously. Increasing ground-glass opacities and nodules are noted on the right. The left lung is clear. No pneumothorax is seen. Upper Abdomen: No acute abnormality. Musculoskeletal: Thoracotomy changes are noted in the ribs on the right. Mild degenerative changes are noted in the thoracic spine. No acute osseous abnormality. IMPRESSION: 1. Status post right lower lobectomy. Increased consolidation and ground-glass opacities, and nodules are noted in the right lung, suspicious for recurrence. 2. Stable loculated right pleural effusion with pleural thickening. 3. Coronary artery calcifications  and aortic atherosclerosis. Electronically Signed   By: Thornell Sartorius M.D.   On: 10/21/2023 00:32   DG Chest Port 1 View  Result Date: 10/20/2023 CLINICAL DATA:  Questionable sepsis, fever, cough, sore throat EXAM: PORTABLE CHEST 1 VIEW COMPARISON:  09/05/2023 FINDINGS: Stable cardiomediastinal silhouette. Left chest wall Port-A-Cath with tip at the superior cavoatrial junction. The left lung is clear. The nodular opacity in the right lung near the fiducial marker seen previously is not visualized today. Similar small right pleural effusion and associated airspace opacities. IMPRESSION: Similar small right pleural effusion and associated airspace opacities. Pneumonia is difficult to exclude. The previous nodular opacity associated with the fiducial marker in the right lung is no longer visualized. Electronically Signed   By: Minerva Fester M.D.   On: 10/20/2023 22:29    Procedures Procedures    Medications Ordered in ED Medications  cefTRIAXone  (ROCEPHIN) 2 g in sodium chloride 0.9 % 100 mL IVPB (has no administration in time range)    ED Course/ Medical Decision Making/ A&P                                 Medical Decision Making Amount and/or Complexity of Data Reviewed Labs: ordered. Radiology: ordered.   This patient presents to the ED for concern of + blood culture, this involves an extensive number of treatment options, and is a complaint that carries with it a high risk of complications and morbidity.  The differential diagnosis includes but not limited to bacteremia, PNA, CA   Co morbidities that complicate the patient evaluation  Lung CA on radiation,    Additional history obtained:  Additional history obtained from family at bedside who translates and assists with history as above External records from outside source obtained and reviewed including prior visit to oncology dated 09/18/23, history of recurrent non-small cell lung cancer   Lab Tests:  I Ordered, and personally interpreted labs.  The pertinent results include: Lactic acid is 1.2.  CBC with normal WBC.  CMP with mild hypokalemia with potassium of 3.2.   Imaging Studies ordered:  I ordered imaging studies including CXR  I independently visualized and interpreted imaging which showed right pleural effusion  I agree with the radiologist interpretation   Cardiac Monitoring: / EKG:  The patient was maintained on a cardiac monitor.  I personally viewed and interpreted the cardiac monitored which showed an underlying rhythm of: Sinus rhythm, rate 84   Consultations Obtained:  I requested consultation with the ER attending, Dr. Madilyn Hook,  and discussed lab and imaging findings as well as pertinent plan - they recommend: Agrees with plan of care Case discussed with Dr.Sundil with [facility] hospital[facility] service.  Day team to see, requests respiratory pathogen panel, sputum culture, vancomycin per pharmacy and Rocephin.   Problem List / ED Course /  Critical interventions / Medication management  62 year old female presents for evaluation after phone call with positive blood culture.  Seen in the ER 2 days ago and diagnosed with pneumonia, called today to inform of positive blood culture, positive for staph.  Due to diagnosis of pneumonia with right lower lobectomy, on radiation for non-small cell lung cancer, patient was called back to the hospital for recheck.  She denies fevers, has had 1 dose of her Augmentin, feels like her cough is improving with medication provided at discharge.  Lactic acid is within normal notes.  CBC with normal WBC.  Case  discussed with hospitalist as above with plan for admission. I ordered medication including vancomycin, rocephin  for PNA, bacteremia   Reevaluation of the patient after these medicines showed that the patient stayed the same I have reviewed the patients home medicines and have made adjustments as needed   Social Determinants of Health:  Lives with family   Test / Admission - Considered:  admit         Final Clinical Impression(s) / ED Diagnoses Final diagnoses:  Bacteremia    Rx / DC Orders ED Discharge Orders     None         Alden Hipp 10/22/23 [id number]    Tilden Fossa, MD 10/22/23 [phone]

## 2023-10-22 NOTE — ED Notes (Signed)
Attempted to contact 5W for purple man

## 2023-10-22 NOTE — Hospital Course (Signed)
62 year old female history of of non-small cell lung cancer diagnosed in March 2020 current on systemic chemotherapy.  Patient initially presented to emergency department 2 days ago with complaining of cough and fever.  During that time CT chest did not showed any evidence of pneumonia except increased consolidation and groundglass opacity nodule in the right lung  Suspicion for recurrence of cancer.  Given patient was hemodynamically stable she was discharged from the emergency department with oral Levaquin.  However blood culture was drawn and blood culture found positive for the Staphylococcus species and pending susceptibility.  Patient was called to ED to come for IV antibiotic.  Intention to ED tonight patient is hemodynamically stable.  CMP showed mild hyponatremia and hypokalemia otherwise unremarkable.  CBC no leukocytosis.  Lactic acid 1.2 WNL.  Repeat blood cultures are pending.  -Hospitalist has been contacted for further management of bacteremia and management of pneumonia.   Recommended to start IV vancomycin with consult for bacteremia and IV ceftriaxone 2 g given patient is immunocompromised.  Check a sputum culture, respiratory panel and blood cultures.

## 2023-10-22 NOTE — H&P (Addendum)
History and Physical  [name] JXB:[id number] DOB: [birth year] DOA: 10/22/2023  PCP: [name], MD   Chief Complaint: cough, fever   HPI: [name] is a 62 y.o. female with medical history significant for non-small cell lung cancer status post resection, palliative radiotherapy with local recurrence currently on radiation and systemic chemotherapy being admitted to the hospital with concern for bacteremia.  She was recently diagnosed 2 days ago with pneumonia after she presented to the ER with cough and fever, was discharged home with p.o. Levaquin.  She has done well at home, denies any further fevers, no other concerns such as chest pain, vomiting, diarrhea, etc.  She was recalled to the emergency department due to 1 out of 2 blood cultures positive for staph species.  Review of Systems: Please see HPI for pertinent positives and negatives. A complete 10 system review of systems are otherwise negative.  Past Medical History:  Diagnosis Date   Drug-induced skin rash 02/27/2017   Encounter for antineoplastic chemotherapy 01/19/2016   History of radiation therapy 05/02/11 to 06/08/11   right lung   Lung cancer [facility])    right lower lobe adenocarcinoma   Past Surgical History:  Procedure Laterality Date   BRONCHIAL BIOPSY  09/05/2023   Procedure: BRONCHIAL BIOPSIES;  Surgeon: Leslye Peer, MD;  Location: [facility] ENDOSCOPY;  Service: Pulmonary;;   BRONCHIAL BRUSHINGS  09/05/2023   Procedure: BRONCHIAL BRUSHINGS;  Surgeon: Leslye Peer, MD;  Location: [facility] ENDOSCOPY;  Service: Pulmonary;;   BRONCHIAL NEEDLE ASPIRATION BIOPSY  09/05/2023   Procedure: BRONCHIAL NEEDLE ASPIRATION BIOPSIES;  Surgeon: Leslye Peer, MD;  Location: [facility] ENDOSCOPY;  Service: Pulmonary;;   FIDUCIAL MARKER PLACEMENT  09/05/2023   Procedure: FIDUCIAL MARKER PLACEMENT;  Surgeon: Leslye Peer, MD;  Location: [facility] ENDOSCOPY;  Service: Pulmonary;;   LUNG LOBECTOMY  04/18/2009   RLL   PORTACATH PLACEMENT  02/29/2012    Procedure: INSERTION PORT-A-CATH;  Surgeon: Ines Bloomer, MD;  Location: [facility] OR;  Service: Thoracic;  Laterality: Left;  PowerPort 8 F attachable in left internal jugular.    Social History:  reports that she has never smoked. She has never used smokeless tobacco. She reports that she does not drink alcohol and does not use drugs.   Allergies  Allergen Reactions   Codeine Nausea Only   Doxycycline Nausea And Vomiting    vomiting and headache    Family History  Problem Relation Age of Onset   Heart Problems Mother    Cancer Neg Hx      Prior to Admission medications   Medication Sig Start Date End Date Taking? Authorizing Provider  albuterol (PROAIR HFA) 108 (90 Base) MCG/ACT inhaler Inhale 1-2 puffs into the lungs every 6 (six) hours as needed for wheezing or shortness of breath. 06/19/23   [name], MD  amoxicillin-clavulanate (AUGMENTIN) 875-125 MG tablet Take 1 tablet by mouth every 12 (twelve) hours. 10/21/23   Nira Conn, MD  chlorpheniramine-HYDROcodone (TUSSIONEX) 10-8 MG/5ML Take 5 mLs by mouth every 12 (twelve) hours as needed for cough. 10/21/23   Cardama, Amadeo Garnet, MD  clindamycin (CLINDAGEL) 1 % gel APPLY TO AFFECTED AREA 2 (TWO) TIMES DAILY. 10/16/23   Si Gaul, MD  diltiazem (TIADYLT ER) 120 MG 24 hr capsule Take 1 capsule by mouth daily. 07/19/23   Duke Salvia, MD  erlotinib (TARCEVA) 150 MG tablet Take 1 tablet (150 mg total) by mouth daily. Take on an empty stomach at least 1 hour before  or 2 hours after food. 10/04/23   Si Gaul, MD  ibuprofen (ADVIL) 600 MG tablet Take 1 tablet (600 mg total) by mouth every 6 (six) hours as needed for fever or mild pain (pain score 1-3). 10/21/23   Nira Conn, MD    Physical Exam: BP 111/82 (BP Location: Left Arm)   Pulse 88   Temp 98.6 F (37 C) (Oral)   Resp 19   Ht 4\' 5"  (1.346 m)   Wt 52.2 kg   LMP 02/10/2012   SpO2 97%   BMI 28.78 kg/m   General:  Alert,  oriented, calm, in no acute distress, her son and husband are at the bedside Cardiovascular: RRR, no murmurs or rubs, no peripheral edema  Respiratory: clear to auscultation bilaterally, no wheezes, no crackles  Abdomen: soft, nontender, nondistended, normal bowel tones heard  Skin: dry, no rashes  Musculoskeletal: no joint effusions, normal range of motion  Psychiatric: appropriate affect, normal speech  Neurologic: extraocular muscles intact, clear speech, moving all extremities with intact sensorium         Labs on Admission:  Basic Metabolic Panel: Recent Labs  Lab 10/20/23 2225 10/22/23 0523  NA 136 134*  K 4.0 3.2*  CL 101 101  CO2 26 26  GLUCOSE 108* 103*  BUN <5* 8  CREATININE 0.42* 0.52  CALCIUM 8.3* 8.5*   Liver Function Tests: Recent Labs  Lab 10/20/23 2225 10/22/23 0523  AST 42* 31  ALT 26 29  ALKPHOS 90 87  BILITOT 1.8* 1.0  PROT 7.1 7.0  ALBUMIN 3.4* 3.4*   No results for input(s): "LIPASE", "AMYLASE" in the last 168 hours. No results for input(s): "AMMONIA" in the last 168 hours. CBC: Recent Labs  Lab 10/20/23 2225 10/22/23 0523  WBC 10.0 10.4  NEUTROABS 7.8* 7.7  HGB 11.9* 12.0  HCT 36.0 36.9  MCV 91.1 91.3  PLT 249 279   Cardiac Enzymes: No results for input(s): "CKTOTAL", "CKMB", "CKMBINDEX", "TROPONINI" in the last 168 hours.  BNP (last 3 results) No results for input(s): "BNP" in the last 8760 hours.  ProBNP (last 3 results) No results for input(s): "PROBNP" in the last 8760 hours.  CBG: No results for input(s): "GLUCAP" in the last 168 hours.  Radiological Exams on Admission: DG Chest 2 View  Result Date: 10/22/2023 CLINICAL DATA:  Cough.  Positive blood cultures. EXAM: CHEST - 2 VIEW COMPARISON:  10/21/2023 FINDINGS: There is a left chest wall port a catheter with tip at the superior cavoatrial junction. Lung volumes are low. Unchanged small to moderate right pleural effusion. Decreased aeration to the right lung base compatible  suspected recurrence of tumor within the right mid and right lower lung as demonstrated on CT from prior day. Mild increase interstitial markings. Left lung is clear. IMPRESSION: 1. Unchanged small to moderate right pleural effusion. 2. Decreased aeration to the right lung base compatible with suspected recurrence of tumor within the right mid and right lower lung as demonstrated on CT from prior day. 3. Suspect mild interstitial edema. Electronically Signed   By: Signa Kell M.D.   On: 10/22/2023 06:32   CT Chest W Contrast  Result Date: 10/21/2023 CLINICAL DATA:  Pneumonia, complication suspected. EXAM: CT CHEST WITH CONTRAST TECHNIQUE: Multidetector CT imaging of the chest was performed during intravenous contrast administration. RADIATION DOSE REDUCTION: This exam was performed according to the departmental dose-optimization program which includes automated exposure control, adjustment of the mA and/or kV according to patient size and/or use  of iterative reconstruction technique. CONTRAST:  75mL OMNIPAQUE IOHEXOL 300 MG/ML  SOLN COMPARISON:  08/07/2023, 03/31/2023, 10/20/2023. FINDINGS: Cardiovascular: The heart is enlarged and there is a trace pericardial effusion. There is atherosclerotic calcification of the aorta without evidence of aneurysm. The pulmonary trunk is distended suggesting underlying pulmonary artery hypertension. A left chest port terminates in the superior vena cava. Mediastinum/Nodes: A prominent lymph node is noted in the prevascular space on the left measuring 1 cm no hilar or axillary lymphadenopathy. There is a stable 1.9 cm hypodense nodule in the right lobe of the thyroid gland. The trachea and esophagus are within normal limits. Lungs/Pleura: A small loculated pleural effusion is noted on the right with a associated mild pleural thickening. Right lower lobectomy changes are noted. Increased consolidation is noted in the right lung. There is redemonstration of a subsolid nodule  in the right upper lobe measuring 2 cm, versus 1.6 cm previously. Increasing ground-glass opacities and nodules are noted on the right. The left lung is clear. No pneumothorax is seen. Upper Abdomen: No acute abnormality. Musculoskeletal: Thoracotomy changes are noted in the ribs on the right. Mild degenerative changes are noted in the thoracic spine. No acute osseous abnormality. IMPRESSION: 1. Status post right lower lobectomy. Increased consolidation and ground-glass opacities, and nodules are noted in the right lung, suspicious for recurrence. 2. Stable loculated right pleural effusion with pleural thickening. 3. Coronary artery calcifications and aortic atherosclerosis. Electronically Signed   By: Thornell Sartorius M.D.   On: 10/21/2023 00:32   DG Chest Port 1 View  Result Date: 10/20/2023 CLINICAL DATA:  Questionable sepsis, fever, cough, sore throat EXAM: PORTABLE CHEST 1 VIEW COMPARISON:  09/05/2023 FINDINGS: Stable cardiomediastinal silhouette. Left chest wall Port-A-Cath with tip at the superior cavoatrial junction. The left lung is clear. The nodular opacity in the right lung near the fiducial marker seen previously is not visualized today. Similar small right pleural effusion and associated airspace opacities. IMPRESSION: Similar small right pleural effusion and associated airspace opacities. Pneumonia is difficult to exclude. The previous nodular opacity associated with the fiducial marker in the right lung is no longer visualized. Electronically Signed   By: Minerva Fester M.D.   On: 10/20/2023 22:29    Assessment/Plan [name] is a 62 y.o. female with medical history significant for non-small cell lung cancer status post resection, palliative radiotherapy with local recurrence currently on radiation and systemic chemotherapy being admitted to the hospital with concern for bacteremia.   Bacteremia-in the setting of immune compromise related to systemic chemotherapy.  Potentially as a result of  her recently diagnosed pneumonia.  May also be contaminant as she is without SIRS and 1 out of 2 bottle is positive. -Observation admission -Follow-up repeat blood cultures -Empiric IV vancomycin and IV Rocephin -ID consulted, for now just recommend empiric antibiotics and hold off on further bacteremia workup due to high likelihood of contaminant  Non-small cell lung cancer with local recurrence-receiving palliative radiation, systemic chemotherapy with carboplatin and Alimta, as well as daily Tarceva -Dr. Arbutus Ped added to her treatment team -Radiation oncology notified of her admission -Hold Tarceva  Hypokalemia-repleted orally, recheck with morning labs  DVT prophylaxis: Lovenox     Code Status: Full Code  Consults called: ID  Admission status: Observation   Time spent: 49 minutes  Talar Fraley Sharlette Dense MD [facility] Pager [phone]  If 7PM-7AM, please contact night-coverage [email or web address] Password [facility]  10/22/2023, 7:40 AM

## 2023-10-22 NOTE — Progress Notes (Signed)
ED Pharmacy Antibiotic Sign Off An antibiotic consult was received from an ED provider for vancomycin per pharmacy dosing for bacteremia. A chart review was completed to assess appropriateness.   The following one time order(s) were placed:  Vancomycin 1000 mg IV x1  Further antibiotic and/or antibiotic pharmacy consults should be ordered by the admitting provider if indicated.   Thank you for allowing pharmacy to be a part of this patient's care.   Lucia Gaskins, [facility]  Clinical Pharmacist 10/22/23 6:40 AM

## 2023-10-22 NOTE — Progress Notes (Signed)
      I called microbiology lab and this patient is ONLY growing a staphylococcal species (that is going to be coag negative staph and not staph lugdunensis) in 1/2 blood cultures  This = highly likely contaimination  I would NOT work her up for bacteremia.  I do not think she needs a formal ID consult. I would simply treat her for pneumonia  I do not think she needs vancomycin      Paulette Blanch Dam 10/22/2023, 9:17 AM

## 2023-10-22 NOTE — ED Notes (Signed)
ED TO INPATIENT HANDOFF REPORT  ED Nurse Name and Phone #: Suzanna Obey [phone]  S Name/Age/Gender [name] 62 y.o. female Room/Bed: WA23/WA23  Code Status   Code Status: Full Code  Home/SNF/Other Home Patient oriented to: self, place, time, and situation Is this baseline? Yes   Triage Complete: Triage complete  Chief Complaint Bacteremia [R78.81]  Triage Note Patient had positive blood cultures and returns to be admitted. Patient is here with her son and Husband.    Allergies Allergies  Allergen Reactions   Codeine Nausea Only   Doxycycline Nausea And Vomiting    vomiting and headache    Level of Care/Admitting Diagnosis ED Disposition     ED Disposition  Admit   Condition  --   Comment  Hospital Area: [facility] [[id number]]  Level of Care: Med-Surg [16]  May place patient in observation at [facility] or [facility] if equivalent level of care is available:: Yes  Covid Evaluation: Asymptomatic - no recent exposure (last 10 days) testing not required  Diagnosis: Bacteremia [790.7.ICD-9-CM]  Admitting Physician: Maryln Gottron [[id number]]  Attending Physician: Kirby Crigler, Parks Neptune [[id number]]          B Medical/Surgery History Past Medical History:  Diagnosis Date   Drug-induced skin rash 02/27/2017   Encounter for antineoplastic chemotherapy 01/19/2016   History of radiation therapy 05/02/11 to 06/08/11   right lung   Lung cancer [facility])    right lower lobe adenocarcinoma   Past Surgical History:  Procedure Laterality Date   BRONCHIAL BIOPSY  09/05/2023   Procedure: BRONCHIAL BIOPSIES;  Surgeon: Leslye Peer, MD;  Location: [facility] ENDOSCOPY;  Service: Pulmonary;;   BRONCHIAL BRUSHINGS  09/05/2023   Procedure: BRONCHIAL BRUSHINGS;  Surgeon: Leslye Peer, MD;  Location: [facility] ENDOSCOPY;  Service: Pulmonary;;   BRONCHIAL NEEDLE ASPIRATION BIOPSY  09/05/2023   Procedure: BRONCHIAL NEEDLE ASPIRATION BIOPSIES;  Surgeon: Leslye Peer, MD;  Location:  [facility] ENDOSCOPY;  Service: Pulmonary;;   FIDUCIAL MARKER PLACEMENT  09/05/2023   Procedure: FIDUCIAL MARKER PLACEMENT;  Surgeon: Leslye Peer, MD;  Location: [facility] ENDOSCOPY;  Service: Pulmonary;;   LUNG LOBECTOMY  04/18/2009   RLL   PORTACATH PLACEMENT  02/29/2012   Procedure: INSERTION PORT-A-CATH;  Surgeon: Ines Bloomer, MD;  Location: [facility] OR;  Service: Thoracic;  Laterality: Left;  PowerPort 8 F attachable in left internal jugular.     A IV Location/Drains/Wounds Patient Lines/Drains/Airways Status     Active Line/Drains/Airways     Name Placement date Placement time Site Days   Implanted Port 02/29/12 Left Other (Comment) 02/29/12  1058  Other (Comment)  4253   Implanted Port 02/29/12 Left Chest 02/29/12  --  Chest  4253   Peripheral IV 10/22/23 22 G Posterior;Right Hand 10/22/23  0520  Hand  less than 1            Intake/Output Last 24 hours No intake or output data in the 24 hours ending 10/22/23 0808  Labs/Imaging Results for orders placed or performed during the hospital encounter of 10/22/23 (from the past 48 hour(s))  CBC with Differential     Status: Abnormal   Collection Time: 10/22/23  5:23 AM  Result Value Ref Range   WBC 10.4 4.0 - 10.5 K/uL   RBC 4.04 3.87 - 5.11 MIL/uL   Hemoglobin 12.0 12.0 - 15.0 g/dL   HCT 95.6 21.3 - 08.6 %   MCV 91.3 80.0 - 100.0 fL   MCH 29.7 26.0 -  34.0 pg   MCHC 32.5 30.0 - 36.0 g/dL   RDW 16.1 09.6 - 04.5 %   Platelets 279 150 - 400 K/uL   nRBC 0.0 0.0 - 0.2 %   Neutrophils Relative % 74 %   Neutro Abs 7.7 1.7 - 7.7 K/uL   Lymphocytes Relative 12 %   Lymphs Abs 1.2 0.7 - 4.0 K/uL   Monocytes Relative 11 %   Monocytes Absolute 1.2 (H) 0.1 - 1.0 K/uL   Eosinophils Relative 2 %   Eosinophils Absolute 0.2 0.0 - 0.5 K/uL   Basophils Relative 0 %   Basophils Absolute 0.0 0.0 - 0.1 K/uL   Immature Granulocytes 1 %   Abs Immature Granulocytes 0.05 0.00 - 0.07 K/uL    Comment: Performed at [facility], [address], [place], [place] [address]  Comprehensive metabolic panel     Status: Abnormal   Collection Time: 10/22/23  5:23 AM  Result Value Ref Range   Sodium 134 (L) 135 - 145 mmol/L   Potassium 3.2 (L) 3.5 - 5.1 mmol/L   Chloride 101 98 - 111 mmol/L   CO2 26 22 - 32 mmol/L   Glucose, Bld 103 (H) 70 - 99 mg/dL    Comment: Glucose reference range applies only to samples taken after fasting for at least 8 hours.   BUN 8 8 - 23 mg/dL   Creatinine, Ser 1.91 0.44 - 1.00 mg/dL   Calcium 8.5 (L) 8.9 - 10.3 mg/dL   Total Protein 7.0 6.5 - 8.1 g/dL   Albumin 3.4 (L) 3.5 - 5.0 g/dL   AST 31 15 - 41 U/L   ALT 29 0 - 44 U/L   Alkaline Phosphatase 87 38 - 126 U/L   Total Bilirubin 1.0 <1.2 mg/dL   GFR, Estimated >47 >82 mL/min    Comment: (NOTE) Calculated using the CKD-EPI Creatinine Equation (2021)    Anion gap 7 5 - 15    Comment: Performed at [facility], [address] [place], [place] [address]  Lactic acid, plasma     Status: None   Collection Time: 10/22/23  5:23 AM  Result Value Ref Range   Lactic Acid, Venous 1.2 0.5 - 1.9 mmol/L    Comment: Performed at [facility], [address], [place], [place] [address]  Lactic acid, plasma     Status: None   Collection Time: 10/22/23  7:26 AM  Result Value Ref Range   Lactic Acid, Venous 0.6 0.5 - 1.9 mmol/L    Comment: Performed at [facility], [address], [place], [place] [address]   *Note: Due to a large number of results and/or encounters for the requested time period, some results have not been displayed. A complete set of results can be found in Results Review.   DG Chest 2 View  Result Date: 10/22/2023 CLINICAL DATA:  Cough.  Positive blood cultures. EXAM: CHEST - 2 VIEW COMPARISON:  10/21/2023 FINDINGS: There is a left chest wall port a catheter with tip at the superior cavoatrial junction. Lung volumes are low. Unchanged small to moderate right pleural effusion.  Decreased aeration to the right lung base compatible suspected recurrence of tumor within the right mid and right lower lung as demonstrated on CT from prior day. Mild increase interstitial markings. Left lung is clear. IMPRESSION: 1. Unchanged small to moderate right pleural effusion. 2. Decreased aeration to the right lung base compatible with suspected recurrence of tumor within  the right mid and right lower lung as demonstrated on CT from prior day. 3. Suspect mild interstitial edema. Electronically Signed   By: Signa Kell M.D.   On: 10/22/2023 06:32   CT Chest W Contrast  Result Date: 10/21/2023 CLINICAL DATA:  Pneumonia, complication suspected. EXAM: CT CHEST WITH CONTRAST TECHNIQUE: Multidetector CT imaging of the chest was performed during intravenous contrast administration. RADIATION DOSE REDUCTION: This exam was performed according to the departmental dose-optimization program which includes automated exposure control, adjustment of the mA and/or kV according to patient size and/or use of iterative reconstruction technique. CONTRAST:  75mL OMNIPAQUE IOHEXOL 300 MG/ML  SOLN COMPARISON:  08/07/2023, 03/31/2023, 10/20/2023. FINDINGS: Cardiovascular: The heart is enlarged and there is a trace pericardial effusion. There is atherosclerotic calcification of the aorta without evidence of aneurysm. The pulmonary trunk is distended suggesting underlying pulmonary artery hypertension. A left chest port terminates in the superior vena cava. Mediastinum/Nodes: A prominent lymph node is noted in the prevascular space on the left measuring 1 cm no hilar or axillary lymphadenopathy. There is a stable 1.9 cm hypodense nodule in the right lobe of the thyroid gland. The trachea and esophagus are within normal limits. Lungs/Pleura: A small loculated pleural effusion is noted on the right with a associated mild pleural thickening. Right lower lobectomy changes are noted. Increased consolidation is noted in the right  lung. There is redemonstration of a subsolid nodule in the right upper lobe measuring 2 cm, versus 1.6 cm previously. Increasing ground-glass opacities and nodules are noted on the right. The left lung is clear. No pneumothorax is seen. Upper Abdomen: No acute abnormality. Musculoskeletal: Thoracotomy changes are noted in the ribs on the right. Mild degenerative changes are noted in the thoracic spine. No acute osseous abnormality. IMPRESSION: 1. Status post right lower lobectomy. Increased consolidation and ground-glass opacities, and nodules are noted in the right lung, suspicious for recurrence. 2. Stable loculated right pleural effusion with pleural thickening. 3. Coronary artery calcifications and aortic atherosclerosis. Electronically Signed   By: Thornell Sartorius M.D.   On: 10/21/2023 00:32   DG Chest Port 1 View  Result Date: 10/20/2023 CLINICAL DATA:  Questionable sepsis, fever, cough, sore throat EXAM: PORTABLE CHEST 1 VIEW COMPARISON:  09/05/2023 FINDINGS: Stable cardiomediastinal silhouette. Left chest wall Port-A-Cath with tip at the superior cavoatrial junction. The left lung is clear. The nodular opacity in the right lung near the fiducial marker seen previously is not visualized today. Similar small right pleural effusion and associated airspace opacities. IMPRESSION: Similar small right pleural effusion and associated airspace opacities. Pneumonia is difficult to exclude. The previous nodular opacity associated with the fiducial marker in the right lung is no longer visualized. Electronically Signed   By: Minerva Fester M.D.   On: 10/20/2023 22:29    Pending Labs Unresulted Labs (From admission, onward)     Start     Ordered   10/23/23 0500  Basic metabolic panel  Tomorrow morning,   R        10/22/23 0733   10/23/23 0500  CBC  Tomorrow morning,   R        10/22/23 0733   10/22/23 0754  HIV Antibody (routine testing w rflx)  (HIV Antibody (Routine testing w reflex) panel)  Add-on,   AD         10/22/23 0753   10/22/23 0654  Legionella Pneumophila Serogp 1 Ur Ag  Once,   URGENT        10/22/23 0981  10/22/23 0654  Strep pneumoniae urinary antigen  Once,   URGENT        10/22/23 0653   10/22/23 0654  MRSA Next Gen by PCR, Nasal  Once,   URGENT       Question Answer Comment  Patient immune status Immunocompromised   Release to patient Immediate      10/22/23 0654   10/22/23 0640  Expectorated Sputum Assessment w Gram Stain, Rflx to Resp Cult  Once,   R        10/22/23 0639   10/22/23 0640  Respiratory (~20 pathogens) panel by PCR  (Respiratory panel by PCR (~20 pathogens, ~24 hr TAT)  w precautions)  Once,   URGENT        10/22/23 0639   10/22/23 0453  Blood culture (routine x 2)  BLOOD CULTURE X 2,   R (with STAT occurrences)      10/22/23 0456            Vitals/Pain Today's Vitals   10/22/23 0432 10/22/23 0500 10/22/23 0630 10/22/23 0800  BP: 128/79 119/73 111/82 132/87  Pulse: 87 85 88 88  Resp: 18 18 19 20   Temp: 98.6 F (37 C)     TempSrc: Oral     SpO2: 99% 98% 97% 99%  Weight: 52.2 kg     Height: 4\' 5"  (1.346 m)     PainSc: 0-No pain       Isolation Precautions Droplet precaution  Medications Medications  vancomycin (VANCOCIN) IVPB 1000 mg/200 mL premix (has no administration in time range)  cefTRIAXone (ROCEPHIN) 2 g in sodium chloride 0.9 % 100 mL IVPB (has no administration in time range)  enoxaparin (LOVENOX) injection 40 mg (has no administration in time range)  acetaminophen (TYLENOL) tablet 650 mg (has no administration in time range)    Or  acetaminophen (TYLENOL) suppository 650 mg (has no administration in time range)  traZODone (DESYREL) tablet 25 mg (has no administration in time range)  ondansetron (ZOFRAN) tablet 4 mg (has no administration in time range)    Or  ondansetron (ZOFRAN) injection 4 mg (has no administration in time range)  albuterol (PROVENTIL) (2.5 MG/3ML) 0.083% nebulizer solution 2.5 mg (has no administration  in time range)  vancomycin (VANCOREADY) IVPB 500 mg/100 mL (has no administration in time range)  cefTRIAXone (ROCEPHIN) 2 g in sodium chloride 0.9 % 100 mL IVPB (2 g Intravenous New Bag/Given 10/22/23 0736)  potassium chloride SA (KLOR-CON M) CR tablet 40 mEq (40 mEq Oral Given 10/22/23 1324)    Mobility walks     Focused Assessments N/A   R Recommendations: See Admitting Provider Note  Report given to:   Additional Notes: N/A

## 2023-10-22 NOTE — ED Notes (Signed)
Patient transported to X-ray 

## 2023-10-22 NOTE — Progress Notes (Signed)
Pharmacy Antibiotic Note  [name] is a 62 y.o. female with NSCLC on Tarceva  and XRT PTA who presented to the ED on 10/20/23 with c/o cough and fever. She was diagnosed with PNA and discharged with a prescription for Augmentin. One of four blood culture bottles collected on 10/20/23 came back with GPC (BCID= staph species). Patient was asked come back to the hospital on 10/22/23 for further workup and treatment. Pharmacy has been consulted to dose vancomycin for PNA and bacteremia.  Today, 10/22/2023: - scr 0.52 (crcl~45) - wbc wnl  - LA 1.2  Plan: - vancomycin 1000 mg IV x1, then 500 mg q24h for est AUC 407 -Ceftriaxone 2gm q24h per MD  __________________________________________  Height: 4\' 5"  (134.6 cm) Weight: 52.2 kg (115 lb) IBW/kg (Calculated) : 29.4  Temp (24hrs), Avg:98.6 F (37 C), Min:98.6 F (37 C), Max:98.6 F (37 C)  Recent Labs  Lab 10/20/23 2225 10/20/23 2244 10/21/23 0036 10/22/23 0523  WBC 10.0  --   --  10.4  CREATININE 0.42*  --   --  0.52  LATICACIDVEN  --  1.4 0.9 1.2    Estimated Creatinine Clearance: 44.9 mL/min (by C-G formula based on SCr of 0.52 mg/dL).    Allergies  Allergen Reactions   Codeine Nausea Only   Doxycycline Nausea And Vomiting    vomiting and headache     Thank you for allowing pharmacy to be a part of this patient's care.  Lucia Gaskins 10/22/2023 7:39 AM

## 2023-10-22 NOTE — ED Notes (Signed)
Positive cultures, pt called to come back to the ED for tx. (10/22/23; 9147)

## 2023-10-22 NOTE — Plan of Care (Signed)

## 2023-10-22 NOTE — ED Triage Notes (Signed)
Patient had positive blood cultures and returns to be admitted. Patient is here with her son and Husband.

## 2023-10-23 ENCOUNTER — Ambulatory Visit
Admission: RE | Admit: 2023-10-23 | Discharge: 2023-10-23 | Disposition: A | Discharge status: Home / Self Care | Payer: Medicare Other | Source: Ambulatory Visit | Attending: Radiation Oncology | Admitting: Radiation Oncology

## 2023-10-23 ENCOUNTER — Other Ambulatory Visit: Payer: Self-pay

## 2023-10-23 DIAGNOSIS — Z510 Encounter for antineoplastic radiation therapy: Secondary | ICD-10-CM | POA: Diagnosis not present

## 2023-10-23 DIAGNOSIS — T300 Burn of unspecified body region, unspecified degree: Secondary | ICD-10-CM | POA: Insufficient documentation

## 2023-10-23 DIAGNOSIS — C3411 Malignant neoplasm of upper lobe, right bronchus or lung: Secondary | ICD-10-CM | POA: Diagnosis not present

## 2023-10-23 DIAGNOSIS — R799 Abnormal finding of blood chemistry, unspecified: Secondary | ICD-10-CM | POA: Diagnosis not present

## 2023-10-23 LAB — RAD ONC ARIA SESSION SUMMARY
Course Elapsed Days: 7
Plan Fractions Treated to Date: 3
Plan Prescribed Dose Per Fraction: 12 Gy
Plan Total Fractions Prescribed: 5
Plan Total Prescribed Dose: 60 Gy
Reference Point Dosage Given to Date: 36 Gy
Reference Point Session Dosage Given: 12 Gy
Session Number: 3

## 2023-10-23 LAB — CBC
HCT: 38.3 % (ref 36.0–46.0)
Hemoglobin: 12 g/dL (ref 12.0–15.0)
MCH: 29.4 pg (ref 26.0–34.0)
MCHC: 31.3 g/dL (ref 30.0–36.0)
MCV: 93.9 fL (ref 80.0–100.0)
Platelets: 305 10*3/uL (ref 150–400)
RBC: 4.08 MIL/uL (ref 3.87–5.11)
RDW: 13.9 % (ref 11.5–15.5)
WBC: 7 10*3/uL (ref 4.0–10.5)
nRBC: 0 % (ref 0.0–0.2)

## 2023-10-23 LAB — CULTURE, BLOOD (ROUTINE X 2): Special Requests: ADEQUATE

## 2023-10-23 LAB — BASIC METABOLIC PANEL
Anion gap: 7 (ref 5–15)
BUN: 9 mg/dL (ref 8–23)
CO2: 26 mmol/L (ref 22–32)
Calcium: 8.6 mg/dL — ABNORMAL LOW (ref 8.9–10.3)
Chloride: 106 mmol/L (ref 98–111)
Creatinine, Ser: 0.48 mg/dL (ref 0.44–1.00)
GFR, Estimated: >60 mL/min (ref >60)
Glucose, Bld: 111 mg/dL — ABNORMAL HIGH (ref 70–99)
Potassium: 3.5 mmol/L (ref 3.5–5.1)
Sodium: 139 mmol/L (ref 135–145)

## 2023-10-23 MED ORDER — SILVER SULFADIAZINE 1 % EX CREA
TOPICAL_CREAM | Freq: Every day | CUTANEOUS | Status: DC
  Filled 2023-10-23: qty 50

## 2023-10-23 MED ORDER — SILVER SULFADIAZINE 1 % EX CREA: TOPICAL_CREAM | Freq: Every day | CUTANEOUS | Status: AC

## 2023-10-23 NOTE — Care Management Obs Status (Signed)
MEDICARE OBSERVATION STATUS NOTIFICATION   Patient Details  Name: [name] MRN: [id number] Date of Birth: 01-Nov-1961   Medicare Observation Status Notification Given:  Yes  Verbal review only.  Bradin Mcadory, LCSW 10/23/2023, 10:14 AM

## 2023-10-23 NOTE — Progress Notes (Signed)
   10/23/23 1029  TOC Brief Assessment  Insurance and Status Reviewed  Patient has primary care physician Yes  Home environment has been reviewed home with spouse/ family  Prior level of function: independent  Prior/Current Home Services No current home services  Social Determinants of Health Reivew SDOH reviewed no interventions necessary  Readmission risk has been reviewed Yes  Transition of care needs no transition of care needs at this time
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
## 2023-10-23 NOTE — Discharge Summary (Addendum)
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 than nasopharyngeal swab, presence of viral mutation(s) within the areas targeted by this assay, and inadequate number of viral copies(<138 copies/mL). A negative result must be combined with clinical observations, patient history, and epidemiological information. The expected result is Negative.  Fact Sheet for Patients:  BloggerCourse.com  Fact Sheet for Healthcare Providers:  SeriousBroker.it  This test is no t yet approved or cleared by the [place] FDA and  has been authorized for detection and/or diagnosis of SARS-CoV-2 by FDA under an Emergency Use Authorization (EUA). This EUA will remain  in effect (meaning this test can be used) for the duration of the COVID-19 declaration under Section 564(b)(1) of the Act, 21 U.S.C.section 360bbb-3(b)(1), unless the authorization is terminated  or revoked sooner.       Influenza A by PCR NEGATIVE NEGATIVE Final   Influenza B by PCR NEGATIVE NEGATIVE Final    Comment: (NOTE) The Xpert Xpress SARS-CoV-2/FLU/RSV plus assay is intended as an aid in the diagnosis of influenza from Nasopharyngeal swab specimens and should not be used as a sole basis for treatment. Nasal washings and aspirates are unacceptable for Xpert Xpress SARS-CoV-2/FLU/RSV testing.  Fact Sheet for Patients: BloggerCourse.com  Fact Sheet for Healthcare Providers: SeriousBroker.it  This test is not yet approved or cleared by the [place] FDA and has been authorized for detection and/or diagnosis of SARS-CoV-2 by FDA under an Emergency Use Authorization (EUA). This EUA will remain in effect (meaning this test can be used) for the duration of the COVID-19 declaration under Section 564(b)(1) of the Act, 21 U.S.C. section 360bbb-3(b)(1), unless the authorization is terminated or revoked.     Resp Syncytial Virus by PCR NEGATIVE  NEGATIVE Final    Comment: (NOTE) Fact Sheet for Patients: BloggerCourse.com  Fact Sheet for Healthcare Providers: SeriousBroker.it  This test is not yet approved or cleared by the [place] FDA and has been authorized for detection and/or diagnosis of SARS-CoV-2 by FDA under an Emergency Use Authorization (EUA). This EUA will remain in effect (meaning this test can be used) for the duration of the COVID-19 declaration under Section 564(b)(1) of the Act, 21 U.S.C. section 360bbb-3(b)(1), unless the authorization is terminated or revoked.  Performed at [facility], [address], [place], [place] [address]   Blood culture (routine x 2)     Status: None (Preliminary result)   Collection Time: 10/22/23  5:23 AM   Specimen: BLOOD RIGHT HAND  Result Value Ref Range Status   Specimen Description   Final    BLOOD RIGHT HAND Performed at [facility], [address], [place], [place] [address]    Special Requests   Final    BOTTLES DRAWN AEROBIC AND ANAEROBIC Blood Culture adequate volume Performed at [facility], [address], [place], [place] [address]    Culture   Final    NO GROWTH < 24 HOURS Performed at [facility], [address] [place], [place] [address]    Report Status PENDING  Incomplete  Blood culture (routine x 2)     Status: None (Preliminary result)   Collection Time: 10/22/23  5:30 AM   Specimen: BLOOD LEFT HAND  Result Value Ref Range Status   Specimen Description   Final    BLOOD LEFT HAND Performed at [facility], [address], [place], [place] [address]    Special Requests   Final    BOTTLES DRAWN AEROBIC AND ANAEROBIC Blood Culture adequate volume Performed at [facility], [address], [place], [place] [address]    Culture   Final    NO GROWTH < 24 HOURS Performed at [facility], [address]  [place], [place] [address]    Report Status PENDING  Incomplete  Expectorated Sputum Assessment w Gram Stain, Rflx to Resp Cult     Status: None   Collection Time: 10/22/23  7:26 AM   Specimen: Sputum  Result Value Ref Range Status   Specimen Description SPUTUM  Final   Special Requests NONE  Final   Sputum evaluation   Final    THIS SPECIMEN IS ACCEPTABLE FOR SPUTUM CULTURE Performed at [facility], [address], [place], [place] [address]    Report Status 10/22/2023 FINAL  Final  Respiratory (~20 pathogens) panel by PCR     Status: Abnormal   Collection Time: 10/22/23  7:26 AM   Specimen: Nasopharyngeal Swab; Respiratory  Result Value Ref Range Status   Adenovirus NOT DETECTED NOT DETECTED Final   Coronavirus 229E NOT DETECTED NOT DETECTED Final    Comment: (NOTE) The Coronavirus on the Respiratory Panel, DOES NOT test for the novel  Coronavirus (2019 nCoV)    Coronavirus HKU1 NOT DETECTED NOT DETECTED Final   Coronavirus NL63 NOT DETECTED NOT DETECTED Final   Coronavirus OC43 NOT DETECTED NOT DETECTED Final   Metapneumovirus NOT DETECTED NOT DETECTED Final   Rhinovirus / Enterovirus DETECTED (A) NOT DETECTED Final   Influenza A NOT DETECTED NOT DETECTED Final   Influenza B NOT DETECTED NOT DETECTED Final   Parainfluenza Virus 1 NOT DETECTED NOT DETECTED Final   Parainfluenza Virus 2 NOT DETECTED NOT DETECTED Final   Parainfluenza Virus 3 NOT DETECTED NOT DETECTED Final   Parainfluenza Virus 4 NOT DETECTED NOT DETECTED Final   Respiratory Syncytial Virus NOT DETECTED  NOT DETECTED Final   Bordetella pertussis NOT DETECTED NOT DETECTED Final   Bordetella Parapertussis NOT DETECTED NOT DETECTED Final   Chlamydophila pneumoniae NOT DETECTED NOT DETECTED Final   Mycoplasma pneumoniae NOT DETECTED NOT DETECTED Final    Comment: Performed at [facility], [address], [place], [place] [address]  MRSA Next Gen by PCR, Nasal     Status: None   Collection  Time: 10/22/23  7:26 AM   Specimen: Nasopharyngeal Swab; Nasal Swab  Result Value Ref Range Status   MRSA by PCR Next Gen NOT DETECTED NOT DETECTED Final    Comment: (NOTE) The GeneXpert MRSA Assay (FDA approved for NASAL specimens only), is one component of a comprehensive MRSA colonization surveillance program. It is not intended to diagnose MRSA infection nor to guide or monitor treatment for MRSA infections. Test performance is not FDA approved in patients less than 58 years old. Performed at [facility], [address] [place], [place] [address]   Culture, Respiratory w Gram Stain     Status: None (Preliminary result)   Collection Time: 10/22/23  7:26 AM   Specimen: SPU  Result Value Ref Range Status   Specimen Description   Final    SPUTUM Performed at [facility], [address] [place], [place] [address]    Special Requests   Final    NONE Reflexed from [id number] Performed at [facility], [address], [place], [place] [address]    Gram Stain   Final    FEW SQUAMOUS EPITHELIAL CELLS PRESENT FEW WBC PRESENT,BOTH PMN AND MONONUCLEAR FEW GRAM POSITIVE COCCI IN PAIRS FEW GRAM NEGATIVE RODS Performed at [facility], [address] [place], [place] [address]    Culture PENDING  Incomplete   Report Status PENDING  Incomplete    Procedures/Studies: DG Chest 2 View  Result Date: 10/22/2023 CLINICAL DATA:  Cough.  Positive blood cultures. EXAM: CHEST - 2 VIEW COMPARISON:  10/21/2023 FINDINGS: There is a left chest wall port a catheter with tip at the superior cavoatrial junction. Lung volumes are low. Unchanged small to moderate right pleural effusion. Decreased aeration to the right lung base compatible suspected recurrence of tumor within the right mid and right lower lung as demonstrated on CT from prior day. Mild increase interstitial markings. Left lung is clear. IMPRESSION: 1. Unchanged small to moderate  right pleural effusion. 2. Decreased aeration to the right lung base compatible with suspected recurrence of tumor within the right mid and right lower lung as demonstrated on CT from prior day. 3. Suspect mild interstitial edema. Electronically Signed   By: Signa Kell M.D.   On: 10/22/2023 06:32   CT Chest W Contrast  Result Date: 10/21/2023 CLINICAL DATA:  Pneumonia, complication suspected. EXAM: CT CHEST WITH CONTRAST TECHNIQUE: Multidetector CT imaging of the chest was performed during intravenous contrast administration. RADIATION DOSE REDUCTION: This exam was performed according to the departmental dose-optimization program which includes automated exposure control, adjustment of the mA and/or kV according to patient size and/or use of iterative reconstruction technique. CONTRAST:  75mL OMNIPAQUE IOHEXOL 300 MG/ML  SOLN COMPARISON:  08/07/2023, 03/31/2023, 10/20/2023. FINDINGS: Cardiovascular: The heart is enlarged and there is a trace pericardial effusion. There is atherosclerotic calcification of the aorta without evidence of aneurysm. The pulmonary trunk is distended suggesting underlying pulmonary artery hypertension. A left chest port terminates in the superior vena cava. Mediastinum/Nodes: A prominent lymph node is noted in the prevascular  space on the left measuring 1 cm no hilar or axillary lymphadenopathy. There is a stable 1.9 cm hypodense nodule in the right lobe of the thyroid gland. The trachea and esophagus are within normal limits. Lungs/Pleura: A small loculated pleural effusion is noted on the right with a associated mild pleural thickening. Right lower lobectomy changes are noted. Increased consolidation is noted in the right lung. There is redemonstration of a subsolid nodule in the right upper lobe measuring 2 cm, versus 1.6 cm previously. Increasing ground-glass opacities and nodules are noted on the right. The left lung is clear. No pneumothorax is seen. Upper Abdomen: No acute  abnormality. Musculoskeletal: Thoracotomy changes are noted in the ribs on the right. Mild degenerative changes are noted in the thoracic spine. No acute osseous abnormality. IMPRESSION: 1. Status post right lower lobectomy. Increased consolidation and ground-glass opacities, and nodules are noted in the right lung, suspicious for recurrence. 2. Stable loculated right pleural effusion with pleural thickening. 3. Coronary artery calcifications and aortic atherosclerosis. Electronically Signed   By: Thornell Sartorius M.D.   On: 10/21/2023 00:32   DG Chest Port 1 View  Result Date: 10/20/2023 CLINICAL DATA:  Questionable sepsis, fever, cough, sore throat EXAM: PORTABLE CHEST 1 VIEW COMPARISON:  09/05/2023 FINDINGS: Stable cardiomediastinal silhouette. Left chest wall Port-A-Cath with tip at the superior cavoatrial junction. The left lung is clear. The nodular opacity in the right lung near the fiducial marker seen previously is not visualized today. Similar small right pleural effusion and associated airspace opacities. IMPRESSION: Similar small right pleural effusion and associated airspace opacities. Pneumonia is difficult to exclude. The previous nodular opacity associated with the fiducial marker in the right lung is no longer visualized. Electronically Signed   By: Minerva Fester M.D.   On: 10/20/2023 22:29     Time coordinating discharge: Over 30 minutes    [name], MD  [facility] 10/23/2023, 1:50 PM

## 2023-10-24 ENCOUNTER — Telehealth (HOSPITAL_BASED_OUTPATIENT_CLINIC_OR_DEPARTMENT_OTHER): Payer: Self-pay

## 2023-10-24 ENCOUNTER — Ambulatory Visit: Payer: Medicare Other

## 2023-10-24 LAB — CULTURE, RESPIRATORY W GRAM STAIN

## 2023-10-24 LAB — LEGIONELLA PNEUMOPHILA SEROGP 1 UR AG: L. pneumophila Serogp 1 Ur Ag: NEGATIVE

## 2023-10-24 NOTE — Telephone Encounter (Signed)
Post ED Visit - Positive Culture Follow-up  Culture report reviewed by antimicrobial stewardship pharmacist: [facility] []  Enzo Bi, Pharm.D. []  Celedonio Miyamoto, Pharm.D., BCPS AQ-ID []  Garvin Fila, Pharm.D., BCPS [x]  Georgina Pillion, Pharm.D., BCPS []  [place], [address].D., BCPS, AAHIVP []  Estella Husk, Pharm.D., BCPS, AAHIVP []  Lysle Pearl, PharmD, BCPS []  Phillips Climes, PharmD, BCPS []  Agapito Games, PharmD, BCPS []  Verlan Friends, PharmD []  Mervyn Gay, PharmD, BCPS []  Vinnie Level, PharmD  [facility] []  Len Childs, PharmD []  Greer Pickerel, PharmD []  Adalberto Cole, PharmD []  Perlie Gold, Rph []  Lonell Face) Jean Rosenthal, PharmD []  Earl Many, PharmD []  Junita Push, PharmD []  Dorna Leitz, PharmD []  Terrilee Files, PharmD []  Lynann Beaver, PharmD []  Keturah Barre, PharmD []  Loralee Pacas, PharmD []  Bernadene Person, PharmD   Positive blood culture Treated with Amoxicillin Pt admitted and discharged - addressed during admission as just a contaminant. no further patient follow-up is required at this time.  Sandria Senter 10/24/2023, 10:01 AM

## 2023-10-25 ENCOUNTER — Ambulatory Visit
Admission: RE | Admit: 2023-10-25 | Discharge: 2023-10-25 | Disposition: A | Discharge status: Home / Self Care | Payer: Medicare Other | Source: Ambulatory Visit | Attending: Radiation Oncology

## 2023-10-25 ENCOUNTER — Other Ambulatory Visit: Payer: Self-pay

## 2023-10-25 DIAGNOSIS — Z510 Encounter for antineoplastic radiation therapy: Secondary | ICD-10-CM | POA: Diagnosis not present

## 2023-10-25 DIAGNOSIS — C3411 Malignant neoplasm of upper lobe, right bronchus or lung: Secondary | ICD-10-CM | POA: Diagnosis not present

## 2023-10-25 LAB — RAD ONC ARIA SESSION SUMMARY
Course Elapsed Days: 9
Plan Fractions Treated to Date: 4
Plan Prescribed Dose Per Fraction: 12 Gy
Plan Total Fractions Prescribed: 5
Plan Total Prescribed Dose: 60 Gy
Reference Point Dosage Given to Date: 48 Gy
Reference Point Session Dosage Given: 12 Gy
Session Number: 4

## 2023-10-26 ENCOUNTER — Ambulatory Visit: Payer: Medicare Other

## 2023-10-26 LAB — CULTURE, BLOOD (ROUTINE X 2)
Culture: NO GROWTH
Special Requests: ADEQUATE

## 2023-10-27 ENCOUNTER — Ambulatory Visit: Payer: Medicare Other

## 2023-10-27 LAB — CULTURE, BLOOD (ROUTINE X 2)
Culture: NO GROWTH
Culture: NO GROWTH
Special Requests: ADEQUATE
Special Requests: ADEQUATE

## 2023-10-30 ENCOUNTER — Ambulatory Visit
Admission: RE | Admit: 2023-10-30 | Discharge: 2023-10-30 | Disposition: A | Discharge status: Home / Self Care | Payer: Medicare Other | Source: Ambulatory Visit | Attending: Radiation Oncology | Admitting: Radiation Oncology

## 2023-10-30 ENCOUNTER — Other Ambulatory Visit: Payer: Self-pay

## 2023-10-30 ENCOUNTER — Other Ambulatory Visit: Payer: Self-pay | Admitting: Radiation Oncology

## 2023-10-30 DIAGNOSIS — Z510 Encounter for antineoplastic radiation therapy: Secondary | ICD-10-CM | POA: Insufficient documentation

## 2023-10-30 DIAGNOSIS — C3411 Malignant neoplasm of upper lobe, right bronchus or lung: Secondary | ICD-10-CM | POA: Insufficient documentation

## 2023-10-30 DIAGNOSIS — C3431 Malignant neoplasm of lower lobe, right bronchus or lung: Secondary | ICD-10-CM | POA: Diagnosis not present

## 2023-10-30 LAB — RAD ONC ARIA SESSION SUMMARY
Course Elapsed Days: 14
Plan Fractions Treated to Date: 5
Plan Prescribed Dose Per Fraction: 12 Gy
Plan Total Fractions Prescribed: 5
Plan Total Prescribed Dose: 60 Gy
Reference Point Dosage Given to Date: 60 Gy
Reference Point Session Dosage Given: 12 Gy
Session Number: 5

## 2023-10-30 MED ORDER — DILTIAZEM HCL ER BEADS 120 MG PO CP24: 120.0000 mg | ORAL_CAPSULE | Freq: Every day | ORAL | 2 refills | Status: DC

## 2023-11-01 NOTE — Radiation Completion Notes (Signed)
Patient Name: [name], [name] MRN: [id number] Date of Birth: 07/14/1961 Referring Physician: Si Gaul, M.D. Date of Service: 2023-11-01 Radiation Oncologist: Margaretmary Bayley, M.D. [facility]                             RADIATION ONCOLOGY END OF TREATMENT NOTE     Diagnosis: C34.31 Malignant neoplasm of lower lobe, right bronchus or lung Intent: Curative     ==========DELIVERED PLANS==========  First Treatment Date: 2023-10-16 Last Treatment Date: 2023-10-30   Plan Name: Lung_R_SBRT Site: Lung, Right Technique: SBRT/SRT-IMRT Mode: Photon Dose Per Fraction: 12 Gy Prescribed Dose (Delivered / Prescribed): 60 Gy / 60 Gy Prescribed Fxs (Delivered / Prescribed): 5 / 5     ==========ON TREATMENT VISIT DATES========== 2023-10-16, 2023-10-18, 2023-10-23, 2023-10-25, 2023-10-30, 2023-10-30     ==========UPCOMING VISITS==========       ==========APPENDIX - ON TREATMENT VISIT NOTES==========   See weekly On Treatment Notes in Epic for details in the Media tab (listed as Progress notes on the On Treatment Visit Dates listed above).

## 2023-11-13 ENCOUNTER — Encounter (HOSPITAL_COMMUNITY): Payer: Self-pay

## 2023-11-13 ENCOUNTER — Inpatient Hospital Stay: Payer: Medicare Other

## 2023-11-13 ENCOUNTER — Ambulatory Visit (HOSPITAL_COMMUNITY)
Admission: RE | Admit: 2023-11-13 | Discharge: 2023-11-13 | Disposition: A | Discharge status: Home / Self Care | Payer: Medicare Other | Source: Ambulatory Visit | Attending: Internal Medicine | Admitting: Internal Medicine

## 2023-11-13 ENCOUNTER — Inpatient Hospital Stay: Payer: Medicare Other | Attending: Internal Medicine

## 2023-11-13 DIAGNOSIS — Z923 Personal history of irradiation: Secondary | ICD-10-CM | POA: Diagnosis not present

## 2023-11-13 DIAGNOSIS — R21 Rash and other nonspecific skin eruption: Secondary | ICD-10-CM | POA: Insufficient documentation

## 2023-11-13 DIAGNOSIS — I700 Atherosclerosis of aorta: Secondary | ICD-10-CM | POA: Diagnosis not present

## 2023-11-13 DIAGNOSIS — C3431 Malignant neoplasm of lower lobe, right bronchus or lung: Secondary | ICD-10-CM | POA: Insufficient documentation

## 2023-11-13 DIAGNOSIS — R110 Nausea: Secondary | ICD-10-CM | POA: Insufficient documentation

## 2023-11-13 DIAGNOSIS — C3490 Malignant neoplasm of unspecified part of unspecified bronchus or lung: Secondary | ICD-10-CM | POA: Insufficient documentation

## 2023-11-13 DIAGNOSIS — T451X5A Adverse effect of antineoplastic and immunosuppressive drugs, initial encounter: Secondary | ICD-10-CM | POA: Insufficient documentation

## 2023-11-13 DIAGNOSIS — J90 Pleural effusion, not elsewhere classified: Secondary | ICD-10-CM | POA: Diagnosis not present

## 2023-11-13 DIAGNOSIS — Z95828 Presence of other vascular implants and grafts: Secondary | ICD-10-CM

## 2023-11-13 LAB — CBC WITH DIFFERENTIAL (CANCER CENTER ONLY)
Abs Immature Granulocytes: 0.01 10*3/uL (ref 0.00–0.07)
Basophils Absolute: 0 10*3/uL (ref 0.0–0.1)
Basophils Relative: 0 %
Eosinophils Absolute: 0.1 10*3/uL (ref 0.0–0.5)
Eosinophils Relative: 2 %
HCT: 40.2 % (ref 36.0–46.0)
Hemoglobin: 13.2 g/dL (ref 12.0–15.0)
Immature Granulocytes: 0 %
Lymphocytes Relative: 17 %
Lymphs Abs: 1.1 10*3/uL (ref 0.7–4.0)
MCH: 29.6 pg (ref 26.0–34.0)
MCHC: 32.8 g/dL (ref 30.0–36.0)
MCV: 90.1 fL (ref 80.0–100.0)
Monocytes Absolute: 0.6 10*3/uL (ref 0.1–1.0)
Monocytes Relative: 10 %
Neutro Abs: 4.6 10*3/uL (ref 1.7–7.7)
Neutrophils Relative %: 71 %
Platelet Count: 230 10*3/uL (ref 150–400)
RBC: 4.46 MIL/uL (ref 3.87–5.11)
RDW: 13.9 % (ref 11.5–15.5)
WBC Count: 6.5 10*3/uL (ref 4.0–10.5)
nRBC: 0 % (ref 0.0–0.2)

## 2023-11-13 LAB — CMP (CANCER CENTER ONLY)
ALT: 12 U/L (ref 0–44)
AST: 22 U/L (ref 15–41)
Albumin: 4.2 g/dL (ref 3.5–5.0)
Alkaline Phosphatase: 65 U/L (ref 38–126)
Anion gap: 5 (ref 5–15)
BUN: 8 mg/dL (ref 8–23)
CO2: 29 mmol/L (ref 22–32)
Calcium: 8.7 mg/dL — ABNORMAL LOW (ref 8.9–10.3)
Chloride: 105 mmol/L (ref 98–111)
Creatinine: 0.55 mg/dL (ref 0.44–1.00)
GFR, Estimated: >60 mL/min (ref >60)
Glucose, Bld: 97 mg/dL (ref 70–99)
Potassium: 3.7 mmol/L (ref 3.5–5.1)
Sodium: 139 mmol/L (ref 135–145)
Total Bilirubin: 1.3 mg/dL — ABNORMAL HIGH (ref <1.2)
Total Protein: 6.8 g/dL (ref 6.5–8.1)

## 2023-11-13 MED ORDER — HEPARIN SOD (PORK) LOCK FLUSH 100 UNIT/ML IV SOLN
500.0000 [IU] | Freq: Once | INTRAVENOUS | Status: AC
  Administered 2023-11-13: 500 [IU] via INTRAVENOUS

## 2023-11-13 MED ORDER — IOHEXOL 300 MG/ML  SOLN
75.0000 mL | Freq: Once | INTRAMUSCULAR | Status: AC | PRN
  Administered 2023-11-13: 75 mL via INTRAVENOUS

## 2023-11-13 MED ORDER — HEPARIN SOD (PORK) LOCK FLUSH 100 UNIT/ML IV SOLN
INTRAVENOUS | Status: AC
  Filled 2023-11-13: qty 5

## 2023-11-13 MED ORDER — SODIUM CHLORIDE 0.9% FLUSH
10.0000 mL | INTRAVENOUS | Status: DC | PRN
  Administered 2023-11-13: 10 mL via INTRAVENOUS

## 2023-11-17 NOTE — Progress Notes (Unsigned)
[facility] OFFICE PROGRESS NOTE  Kathryn Quint, MD [address] [place] [address]  DIAGNOSIS: Local recurrence of non-small cell lung cancer, adenocarcinoma, initially diagnosed as stage IB (T2a N0 M0) in March of 2010.   PRIOR THERAPY: Status post right lower lobectomy under the care of Dr. Laneta Simmers on 04/08/2009. Status post palliative radiotherapy to the right lower lobe recurrent lung mass under the care of Dr. Mitzi Hansen, completed on June 08, 2011. Systemic chemotherapy with carboplatin for AUC of 5 and Alimta 500 mg/M2 every 3 weeks. She is status post 3 cycles. SBRT to the recurrent right lung tumor under the care of Dr. Kathrynn Running, last dose on 10/30/23.   CURRENT THERAPY: Tarceva 150 mg by mouth daily, therapy beginning 07/24/2012. Status post approximately 135.5 months of therapy.   INTERVAL HISTORY: [name] 62 y.o. female returns to the clinic today for follow-up visit accompanied by her interpretor and daughter.  The patient is currently on targeted treatment with Tarceva.  She has been on this for over 11 years.  She tolerates this fair except she has skin rash and intermittent diarrhea.  He was last seen by Dr. Arbutus Ped on 09/18/2023 at that point time she had repeat imaging studies the prior month that showed another solid nodule and increased size of a density in the area where the tumor was previously located.  She had repeat bronchoscopy and biopsy that showed adenocarcinoma in the same area.  Dr. Arbutus Ped refer the patient to radiation oncology with Dr. Kathrynn Running to consider radiation. She completed this on 10/30/23  Patient was seen in the emergency room on 10/22/2023 to 10/22/2024 for the chief complaint of cough and fever.  CT of the chest did not show any pneumonia.  Her blood cultures were positive for staphylococcal species and she was called back to come back to the emergency room for IV antibiotics.  However, her cultures revealed likely  contamination with staph capitis.  She did have a nasal swab which was positive for rhinovirus.  She also had a wound on the right foot from spilling hot water and this was evaluated by wound care and they recommended Silvadene.  Patient has paronychia for which she has been on clindamycin lotion and doxycycline. Per Dr. Arbutus Ped, he would consider referral to podiatrist.   Since last being seen, she needs refill of several of her medications. She states the clindamycin lotion works better for her rash on her face. She also has dry skin on her extremities. The rash sometimes feels sore. She has been treated with doxycycline in the past for the rash although this is listed in her allergy list.  She has had doxycycline several times in the past and she states she tolerates this fine.  She denies any fever, chills, or night sweats.  She has lost weight since last being seen.  She states food is not taste good.  She is not have any evidence of thrush.  She has a similar cough which is controlled with her Tussionex.  She may have dyspnea on exertion with walking.  Denies any chest pain or hemoptysis.  She had diarrhea for 2 weeks after she was hospitalized due to antibiotics with Augmentin.  Her diarrhea did improve at this time. She sometimes has nausea but does not have any anti-emetics. She did mention that she sometimes feels like food gets stuck in her esophagus when she eats. Therefore, she tries to eat smaller bites and drink fluids in between eating.  She does have prior history of radiation to the chest in the past. She recently had a restaging CT scan. She is here fore evaluation and to review her scan results. Of note, she mentioned she may go back to [place] in May 2025.    MEDICAL HISTORY: Past Medical History:  Diagnosis Date   Drug-induced skin rash 02/27/2017   Encounter for antineoplastic chemotherapy 01/19/2016   History of radiation therapy 05/02/11 to 06/08/11   right lung   Lung cancer (HCC)     right lower lobe adenocarcinoma    ALLERGIES:  is allergic to codeine and doxycycline.  MEDICATIONS:  Current Outpatient Medications  Medication Sig Dispense Refill   albuterol (PROAIR HFA) 108 (90 Base) MCG/ACT inhaler Inhale 1-2 puffs into the lungs every 6 (six) hours as needed for wheezing or shortness of breath. 6.7 g 2   amoxicillin-clavulanate (AUGMENTIN) 875-125 MG tablet Take 1 tablet by mouth every 12 (twelve) hours. 14 tablet 0   diltiazem (CARDIZEM) 60 MG tablet Take 60 mg by mouth as needed (high heart rate).     diltiazem (TIADYLT ER) 120 MG 24 hr capsule Take 1 capsule by mouth daily. 90 capsule 2   doxycycline (VIBRA-TABS) 100 MG tablet Take 1 tablet (100 mg total) by mouth 2 (two) times daily. 20 tablet 0   erlotinib (TARCEVA) 150 MG tablet Take 1 tablet (150 mg total) by mouth daily. Take on an empty stomach at least 1 hour before or 2 hours after food. 30 tablet 6   ibuprofen (ADVIL) 600 MG tablet Take 1 tablet (600 mg total) by mouth every 6 (six) hours as needed for fever or mild pain (pain score 1-3). 30 tablet 0   prochlorperazine (COMPAZINE) 10 MG tablet Take 1 tablet (10 mg total) by mouth every 6 (six) hours as needed. 30 tablet 2   silver sulfADIAZINE (SILVADENE) 1 % cream Apply topically daily. Cleanse foot wound with water, pat dry. Apply Silvadene daily. Top with dry dressing     chlorpheniramine-HYDROcodone (TUSSIONEX) 10-8 MG/5ML Take 5 mLs by mouth every 12 (twelve) hours as needed for cough. 115 mL 0   clindamycin (CLEOCIN T) 1 % lotion Apply topically 2 (two) times daily. 60 mL 0   No current facility-administered medications for this visit.    SURGICAL HISTORY:  Past Surgical History:  Procedure Laterality Date   BRONCHIAL BIOPSY  09/05/2023   Procedure: BRONCHIAL BIOPSIES;  Surgeon: Leslye Peer, MD;  Location: [facility] ENDOSCOPY;  Service: Pulmonary;;   BRONCHIAL BRUSHINGS  09/05/2023   Procedure: BRONCHIAL BRUSHINGS;  Surgeon: Leslye Peer, MD;   Location: [facility] ENDOSCOPY;  Service: Pulmonary;;   BRONCHIAL NEEDLE ASPIRATION BIOPSY  09/05/2023   Procedure: BRONCHIAL NEEDLE ASPIRATION BIOPSIES;  Surgeon: Leslye Peer, MD;  Location: [facility] ENDOSCOPY;  Service: Pulmonary;;   FIDUCIAL MARKER PLACEMENT  09/05/2023   Procedure: FIDUCIAL MARKER PLACEMENT;  Surgeon: Leslye Peer, MD;  Location: [facility] ENDOSCOPY;  Service: Pulmonary;;   LUNG LOBECTOMY  04/18/2009   RLL   PORTACATH PLACEMENT  02/29/2012   Procedure: INSERTION PORT-A-CATH;  Surgeon: Ines Bloomer, MD;  Location: [facility] OR;  Service: Thoracic;  Laterality: Left;  PowerPort 8 F attachable in left internal jugular.    REVIEW OF SYSTEMS:   Review of Systems  Constitutional: Positive for fatigue, appetite change, and weight loss. Negative for chills and fever.  HENT: Positive for occasion dysphagia and taste alterations. Negative for mouth sores, nosebleeds, sore throat.  Eyes: Negative for eye problems  and icterus.  Respiratory: Positive for cough and dyspnea on exertion. Negative for hemoptysis and wheezing.   Cardiovascular: Negative for chest pain and leg swelling.  Gastrointestinal: Negative for abdominal pain, constipation, diarrhea (resolved), nausea and vomiting.  Genitourinary: Negative for bladder incontinence, difficulty urinating, dysuria, frequency and hematuria.   Musculoskeletal: Negative for back pain, gait problem, neck pain and neck stiffness.  Skin: Positive for drug induced rash on face.   Neurological: Negative for dizziness, extremity weakness, gait problem, headaches, light-headedness and seizures.  Hematological: Negative for adenopathy. Does not bruise/bleed easily.  Psychiatric/Behavioral: Negative for confusion, depression and sleep disturbance. The patient is not nervous/anxious.     PHYSICAL EXAMINATION:  Blood pressure 132/75, pulse 65, temperature 98 F (36.7 C), temperature source Temporal, resp. rate 16, weight 113 lb 11.2 oz (51.6 kg), last menstrual period  02/10/2012, SpO2 98%.  ECOG PERFORMANCE STATUS: 1  Physical Exam  Constitutional: Oriented to person, place, and time and well-developed, well-nourished, and in no distress.  HENT:  Head: Normocephalic and atraumatic.  Mouth/Throat: Oropharynx is clear and moist. No oropharyngeal exudate.  Eyes: Conjunctivae are normal. Right eye exhibits no discharge. Left eye exhibits no discharge. No scleral icterus.  Neck: Normal range of motion. Neck supple.  Cardiovascular: Normal rate, regular rhythm, normal heart sounds and intact distal pulses.   Pulmonary/Chest: Effort normal and breath sounds normal. No respiratory distress. No wheezes. No rales.  Abdominal: Soft. Bowel sounds are normal. Exhibits no distension and no mass. There is no tenderness.  Musculoskeletal: Normal range of motion. Exhibits no edema.  Lymphadenopathy:    No cervical adenopathy.  Neurological: Alert and oriented to person, place, and time. Exhibits normal muscle tone. Gait normal. Coordination normal.  Skin: Positive for rash on face. Skin is warm and dry. Not diaphoretic. No erythema. No pallor.  Psychiatric: Mood, memory and judgment normal.  Vitals reviewed.  LABORATORY DATA: Lab Results  Component Value Date   WBC 6.5 11/13/2023   HGB 13.2 11/13/2023   HCT 40.2 11/13/2023   MCV 90.1 11/13/2023   PLT 230 11/13/2023      Chemistry      Component Value Date/Time   NA 139 11/13/2023 1537   NA 143 10/30/2017 1031   K 3.7 11/13/2023 1537   K 4.3 10/30/2017 1031   CL 105 11/13/2023 1537   CL 102 04/30/2013 1002   CO2 29 11/13/2023 1537   CO2 29 10/30/2017 1031   BUN 8 11/13/2023 1537   BUN 14.6 10/30/2017 1031   CREATININE 0.55 11/13/2023 1537   CREATININE 0.7 10/30/2017 1031      Component Value Date/Time   CALCIUM 8.7 (L) 11/13/2023 1537   CALCIUM 9.2 10/30/2017 1031   ALKPHOS 65 11/13/2023 1537   ALKPHOS 103 10/30/2017 1031   AST 22 11/13/2023 1537   AST 20 10/30/2017 1031   ALT 12 11/13/2023  1537   ALT 14 10/30/2017 1031   BILITOT 1.3 (H) 11/13/2023 1537   BILITOT 0.79 10/30/2017 1031       RADIOGRAPHIC STUDIES:  CT Chest W Contrast Result Date: 11/18/2023 CLINICAL DATA:  Non-small cell lung cancer (NSCLC), staging. History of right lower lobectomy. Restaging. * Tracking Code: BO * EXAM: CT CHEST WITH CONTRAST TECHNIQUE: Multidetector CT imaging of the chest was performed during intravenous contrast administration. RADIATION DOSE REDUCTION: This exam was performed according to the departmental dose-optimization program which includes automated exposure control, adjustment of the mA and/or kV according to patient size and/or use of iterative reconstruction  technique. CONTRAST:  75mL OMNIPAQUE IOHEXOL 300 MG/ML  SOLN COMPARISON:  10/21/2023 chest CT.  03/31/2023 PET-CT. FINDINGS: Cardiovascular: Normal heart size. No significant pericardial effusion/thickening. Left anterior descending coronary atherosclerosis. Left internal jugular Port-A-Cath terminates in the lower third of the SVC. Atherosclerotic nonaneurysmal thoracic aorta. Normal caliber pulmonary arteries. No central pulmonary emboli. Mediastinum/Nodes: Hypodense 1.8 cm right thyroid nodule. This has been evaluated on previous imaging. (ref: J Am Coll Radiol. 2015 Feb;12(2): 143-50).Please see the 09/20/2023 thyroid ultrasound report. Unremarkable esophagus. No axillary adenopathy. Stable mildly prominent 0.8 cm right pericardiophrenic node (series 2/image 102). No additional enlarged mediastinal nodes. No discrete hilar adenopathy. Lungs/Pleura: No pneumothorax. Status post right lower lobectomy. Chronic small loculated basilar right pleural effusion is unchanged. No left pleural effusion. Mixed solid and ground-glass density poorly marginated right perihilar 7.6 x 4.5 cm masslike focus (series 8/image 76), slightly decreased from 7.7 x 5.1 cm using similar measurement technique on 08/07/2023 chest CT. Satellite subsolid anterior  right upper lobe 2.1 x 1.0 cm pulmonary nodule (series 8/image 85), slightly increased from 1.7 x 0.9 cm on 08/07/2023 chest CT using similar measurement technique. Separate 0.7 cm subsolid right upper lobe nodule (series 8/image 81), increased from 0.5 cm on 08/07/2023 CT. Indistinct ground-glass patchy opacity in the anterior peripheral basilar left lower lobe on series 8/image 104), new. Upper abdomen: No acute abnormality. Musculoskeletal: No aggressive appearing focal osseous lesions. Chronic healed deformity of the left clavicle shaft. Chronic sclerosis and fragmentation of the posterior right sixth and seventh ribs. Mild thoracic spondylosis. IMPRESSION: 1. Mixed solid and ground-glass density poorly marginated right perihilar 7.6 x 4.5 cm masslike focus, slightly decreased in size since 08/07/2023 chest CT. 2. Satellite subsolid anterior right upper lobe 2.1 x 1.0 cm pulmonary nodule, slightly increased in size since 08/07/2023 chest CT. Separate 0.7 cm subsolid right upper lobe nodule, increased in size since 08/07/2023 CT. Findings are compatible with biopsy-proven multifocal adenocarcinoma. 3. Indistinct ground-glass patchy opacity in the anterior peripheral basilar left lower lobe, new, nonspecific, potentially inflammatory, recommend attention on follow-up chest CT. 4. Stable mildly prominent 0.8 cm right pericardiophrenic node. No additional enlarged thoracic nodes. 5. Chronic small loculated basilar right pleural effusion, unchanged. 6. One vessel coronary atherosclerosis. 7.  Aortic Atherosclerosis (ICD10-I70.0). Electronically Signed   By: Delbert Phenix M.D.   On: 11/18/2023 12:47   DG Chest 2 View Result Date: 10/22/2023 CLINICAL DATA:  Cough.  Positive blood cultures. EXAM: CHEST - 2 VIEW COMPARISON:  10/21/2023 FINDINGS: There is a left chest wall port a catheter with tip at the superior cavoatrial junction. Lung volumes are low. Unchanged small to moderate right pleural effusion. Decreased  aeration to the right lung base compatible suspected recurrence of tumor within the right mid and right lower lung as demonstrated on CT from prior day. Mild increase interstitial markings. Left lung is clear. IMPRESSION: 1. Unchanged small to moderate right pleural effusion. 2. Decreased aeration to the right lung base compatible with suspected recurrence of tumor within the right mid and right lower lung as demonstrated on CT from prior day. 3. Suspect mild interstitial edema. Electronically Signed   By: Signa Kell M.D.   On: 10/22/2023 06:32     ASSESSMENT/PLAN:  This is a very pleasant 62 year old asian female with recurrent non-small cell lung cancer, adenocarcinoma. She is status post surgical resection followed by disease progression.  She underwent systemic chemotherapy with carboplatin and Alimta.  When she is found to have disease progression, she started  on Tarceva 150 mg p.o. daily.  She is status post 135.5 months of treatment.  In September 2024, she had a restaging CT scan that showed disease recurrence.  She had a PET scan   continuous increased consolidation and groundglass opacity adjacent to the posttreatment area again concerning for low-grade adenocarcinoma. The patient was referred to Dr. Delton Coombes and she underwent bronchoscopy that showed recurrent adenocarcinoma.   He underwent SBRT to this area under the care of Dr. Kathrynn Running.  This was last given on 10/30/2023.  She recently had a restaging CT scan performed.  The patient was seen with Dr. Arbutus Ped today.  Dr. Arbutus Ped personally and independently reviewed the scan and discussed the results with the patient today.  The scan showed enlarging satellite subsolid anterior upper lobe nodule and Indistinct ground-glass patchy opacity in the anterior peripheral basilar left lower lobe, new, nonspecific, potentially inflammatory.   Dr. Arbutus Ped recommends close monitoring for know with short term follow up CT in 2 months. This this  continues to increase, he may consider the patient for IV chemotherapy.   She will continue on Tarceva at the same dose.  We will see her back for labs and follow-up visit in 2 months with a repeat CT scan  We will refill tussinex for cough and clindamycin lotion for her drug induced rash. We also gave her anti-emetic for nausea since she did not have one. We will also refill the doxycyline for her drug induced rash. It showed in her allergy list she was allergic to doxycyline but she has had this several times in the past and tolerates it well. This may also help some of the inflammatory changes in the lung. She was advised to use lotion on the dry skin on her extremities.    She was advised to use salt water rinses and biotene for her taste alterations.   Regarding her swallowing, she was advised to eat small bites and drink fluids in between bites. However, if needed to evaluate this further if she has persistent symptoms, would recommend following up with GI to see if she has stricture that requires dilation, especially given her history of radiation. She has been seen by [facility] in the past and is an established patient.    The patient was advised to call immediately if she has any concerning symptoms in the interval. The patient voices understanding of current disease status and treatment options and is in agreement with the current care plan. All questions were answered. The patient knows to call the clinic with any problems, questions or concerns. We can certainly see the patient much sooner if necessary         Orders Placed This Encounter  Procedures   CT Chest W Contrast    Standing Status:   Future    Expected Date:   01/04/2024    Expiration Date:   11/19/2024    If indicated for the ordered procedure, I authorize the administration of contrast media per Radiology protocol:   Yes    Does the patient have a contrast media/X-ray dye allergy?:   No    Preferred imaging  location?:   [facility]   CBC with Differential (Cancer Center Only)    Standing Status:   Future    Expected Date:   01/04/2024    Expiration Date:   11/19/2024   CMP (Cancer Center only)    Standing Status:   Future    Expected Date:   01/04/2024  Expiration Date:   11/19/2024     Johnette Abraham Alante Tolan, PA-C 11/20/23  ADDENDUM: Hematology/Oncology Attending: I had a face-to-face encounter with the patient today.  I reviewed her records, lab, scan and recommended her care plan.  This is a very pleasant 62 years old Asian female with history of recurrent non-small cell lung cancer that was initially diagnosed as stage Ib in March 2010 status post right lower lobectomy followed by palliative radiotherapy to the right lower lobe recurrent mass in July 2012.  She was then treated with systemic chemotherapy with carboplatin and Alimta for 3 cycles.  She started treatment with Tarceva 150 mg p.o. daily on July 24, 2012 and she has been on this treatment since that time and tolerating it fairly well except for the intermittent skin rash and diarrhea.  The patient also had SBRT to recurrent right lung tumor in early December 2024. She had repeat CT scan of the chest performed recently.  This is too early to assess her response to the palliative radiotherapy but the scan showed mixed solid and groundglass density poorly marginated right perihilar 7.6 x 4.5 cm masslike focus slightly decreased in size since September 2024.  There was a satellite subsolid anterior right upper lobe 2.1 x 1.0 cm pulmonary nodule slightly increased in size from the previous scan and compatible with the biopsy-proven multifocal adenocarcinoma.  There was other stable findings. I recommended for the patient to continue her current treatment with Tarceva with the same dose for now.  We will see her back for follow-up visit in 2 months for evaluation with repeat CT scan of the chest for restaging of her disease and to  assess the response to her radiotherapy. For the skin rash, will give her refill of clindamycin lotion as well as doxycycline orally. For the dry cough the patient will continue on Tussionex on as-needed basis. She was advised to call immediately if she has any other concerning symptoms in the interval. The total time spent in the appointment was 30 minutes. Disclaimer: This note was dictated with voice recognition software. Similar sounding words can inadvertently be transcribed and may be missed upon review.

## 2023-11-20 ENCOUNTER — Inpatient Hospital Stay (HOSPITAL_BASED_OUTPATIENT_CLINIC_OR_DEPARTMENT_OTHER): Payer: Medicare Other | Admitting: Physician Assistant

## 2023-11-20 VITALS — BP 132/75 | HR 65 | Temp 98.0°F | Resp 16 | Wt 113.7 lb

## 2023-11-20 DIAGNOSIS — R110 Nausea: Secondary | ICD-10-CM | POA: Diagnosis not present

## 2023-11-20 DIAGNOSIS — T451X5A Adverse effect of antineoplastic and immunosuppressive drugs, initial encounter: Secondary | ICD-10-CM | POA: Diagnosis not present

## 2023-11-20 DIAGNOSIS — C3431 Malignant neoplasm of lower lobe, right bronchus or lung: Secondary | ICD-10-CM | POA: Diagnosis not present

## 2023-11-20 DIAGNOSIS — R059 Cough, unspecified: Secondary | ICD-10-CM | POA: Diagnosis not present

## 2023-11-20 DIAGNOSIS — R21 Rash and other nonspecific skin eruption: Secondary | ICD-10-CM | POA: Diagnosis not present

## 2023-11-20 DIAGNOSIS — Z923 Personal history of irradiation: Secondary | ICD-10-CM | POA: Diagnosis not present

## 2023-11-20 MED ORDER — CLINDAMYCIN PHOSPHATE 1 % EX LOTN: TOPICAL_LOTION | Freq: Two times a day (BID) | CUTANEOUS | 0 refills | Status: DC

## 2023-11-20 MED ORDER — PROCHLORPERAZINE MALEATE 10 MG PO TABS: 10.0000 mg | ORAL_TABLET | Freq: Four times a day (QID) | ORAL | 2 refills | Status: AC | PRN

## 2023-11-20 MED ORDER — HYDROCOD POLI-CHLORPHE POLI ER 10-8 MG/5ML PO SUER: 5.0000 mL | Freq: Two times a day (BID) | ORAL | 0 refills | Status: DC | PRN

## 2023-11-20 MED ORDER — DOXYCYCLINE HYCLATE 100 MG PO TABS: 100.0000 mg | ORAL_TABLET | Freq: Two times a day (BID) | ORAL | 0 refills | Status: DC

## 2023-11-27 NOTE — Progress Notes (Addendum)
  Radiation Oncology         [phone] ________________________________  Name: [name] MRN: [id number]  Date of Service: 11/27/2023  DOB: December 28, 1960  Post Treatment Telephone Note  Diagnosis:  C34.31 Malignant neoplasm of lower lobe, right bronchus or lung (as documented in provider EOT note)  The patient was available for call today w/ the help of [employer], Ms. Glade id# [id number].   Symptoms of fatigue have improved since completing therapy.  Symptoms of skin changes have improved since completing therapy.  Symptoms of esophagitis have improved since completing therapy.   The patient has scheduled follow up with her medical oncologist Dr. Sherrod for ongoing care, and was encouraged to call if she develops concerns or questions regarding radiation.   This concludes the interaction.  Rosaline Minerva, LPN

## 2023-11-28 ENCOUNTER — Ambulatory Visit
Admission: RE | Admit: 2023-11-28 | Discharge: 2023-11-28 | Disposition: A | Discharge status: Home / Self Care | Payer: Medicare Other | Source: Ambulatory Visit | Attending: Radiation Oncology | Admitting: Radiation Oncology

## 2023-12-13 NOTE — Progress Notes (Signed)
12/14/2023 [name] [id number] Feb 25, 1961  Referring provider: [name], * Primary GI doctor: Dr. Tomasa Rand  ASSESSMENT AND PLAN:   Esophageal dysphagia with history of GERD and radiation secondary to right lower low lung cancer Patient declines GERD symptoms, has had progressive dysphagia upper esophagus since radiation in 2020, primarily rice/meats, denies odynophagia, oropharyngeal dysphagia.  Likely radiation esophagitis, stricture, rule out malignancy, gastritis, H pylori.  Can start PPI after EGD, no symptoms of GERD at this time, consider carafate.  Primary cancer of right lower lobe of lung 2010 status post right lower lobectomy with Dr. Lavinia Sharps. Palliative radiotherapy right lower lobe for recurrent lung mass 2012 with Dr. Mitzi Hansen Systemic chemotherapy 3 cycles in 2012 Radiotherapy to recurrent right lung tumor last dose 10/30/2023. No chest pain, no shortness of breath  Screen for colon cancer Had at [facility], 2020, will requests records Recall based off colonoscopy   *Due to language barrier, an interpreter was present during the history-taking and subsequent discussion (and for part of the physical exam) with this patient.    Patient Care Team: [name], MD as PCP - General (Internal Medicine) Duke Salvia, MD as PCP - Electrophysiology (Cardiology)  HISTORY OF PRESENT ILLNESS: 63 y.o. Khmer speaking female with a past medical history of hypertension right lower lobe pulmonary adenocarcinoma follows with Dr. Shirline Frees x 2013 with local recurrence non-small cell lung cancer, GERD anemia of chronic disease and others listed below presents for evaluation of dysphagia.   2010 status post right lower lobectomy with Dr. Lavinia Sharps. Palliative radiotherapy right lower lobe for recurrent lung mass 2012 with Dr. Mitzi Hansen Systemic chemotherapy 3 cycles in 2012 Radiotherapy to recurrent right lung tumor last dose 10/30/2023.  She has had colonoscopy at  [facility], possible about 6 years ago, per notes 11/2018.  Patient complains of difficulty swallowing since her first radiation for lung cancer in 2012, she developed cough with dysphagia. Denies odynophagia. The symptoms are stable. The dysphagia occurs with solids worse with rice.    She denies GERD.  She denies of melena, AB pain, nausea, vomiting.  Patient denies AB bloating/early satiety.  She has had some unintentional weight loss with recent hospital 09/2023 with bacteremia with staph captitis. No chest pain, no SOB.  She states the chemotherapy gives her diarrhea, no hematochezia.   She  reports that she has never smoked. She has never used smokeless tobacco. She reports that she does not drink alcohol and does not use drugs.  RELEVANT GI HISTORY, LABS, IMAGING:  CBC    Component Value Date/Time   WBC 6.5 11/13/2023 1537   WBC 7.0 10/23/2023 0424   RBC 4.46 11/13/2023 1537   HGB 13.2 11/13/2023 1537   HGB 13.2 10/30/2017 1031   HCT 40.2 11/13/2023 1537   HCT 40.2 10/30/2017 1031   PLT 230 11/13/2023 1537   PLT 306 10/30/2017 1031   MCV 90.1 11/13/2023 1537   MCV 86.1 10/30/2017 1031   MCH 29.6 11/13/2023 1537   MCHC 32.8 11/13/2023 1537   RDW 13.9 11/13/2023 1537   RDW 14.5 10/30/2017 1031   LYMPHSABS 1.1 11/13/2023 1537   LYMPHSABS 1.3 10/30/2017 1031   MONOABS 0.6 11/13/2023 1537   MONOABS 0.5 10/30/2017 1031   EOSABS 0.1 11/13/2023 1537   EOSABS 0.1 10/30/2017 1031   BASOSABS 0.0 11/13/2023 1537   BASOSABS 0.0 10/30/2017 1031   Recent Labs    01/09/23 1042 03/10/23 0928 03/31/23 0930 06/12/23 1024 08/07/23 1334 09/18/23 0948 10/20/23  2225 10/22/23 0523 10/23/23 0424 11/13/23 1537  HGB 13.7 14.8 14.1 14.1 14.2 13.9 11.9* 12.0 12.0 13.2    CMP     Component Value Date/Time   NA 139 11/13/2023 1537   NA 143 10/30/2017 1031   K 3.7 11/13/2023 1537   K 4.3 10/30/2017 1031   CL 105 11/13/2023 1537   CL 102 04/30/2013 1002   CO2 29 11/13/2023 1537    CO2 29 10/30/2017 1031   GLUCOSE 97 11/13/2023 1537   GLUCOSE 89 10/30/2017 1031   GLUCOSE 95 04/30/2013 1002   BUN 8 11/13/2023 1537   BUN 14.6 10/30/2017 1031   CREATININE 0.55 11/13/2023 1537   CREATININE 0.7 10/30/2017 1031   CALCIUM 8.7 (L) 11/13/2023 1537   CALCIUM 9.2 10/30/2017 1031   PROT 6.8 11/13/2023 1537   PROT 7.7 10/30/2017 1031   ALBUMIN 4.2 11/13/2023 1537   ALBUMIN 3.9 10/30/2017 1031   AST 22 11/13/2023 1537   AST 20 10/30/2017 1031   ALT 12 11/13/2023 1537   ALT 14 10/30/2017 1031   ALKPHOS 65 11/13/2023 1537   ALKPHOS 103 10/30/2017 1031   BILITOT 1.3 (H) 11/13/2023 1537   BILITOT 0.79 10/30/2017 1031   GFRNONAA >60 11/13/2023 1537   GFRAA >60 08/24/2020 1142      Latest Ref Rng & Units 11/13/2023    3:37 PM 10/22/2023    5:23 AM 10/20/2023   10:25 PM  Hepatic Function  Total Protein 6.5 - 8.1 g/dL 6.8  7.0  7.1   Albumin 3.5 - 5.0 g/dL 4.2  3.4  3.4   AST 15 - 41 U/L 22  31  42   ALT 0 - 44 U/L 12  29  26    Alk Phosphatase 38 - 126 U/L 65  87  90   Total Bilirubin <1.2 mg/dL 1.3  1.0  1.8       Current Medications:    Current Outpatient Medications (Cardiovascular):    diltiazem (CARDIZEM) 60 MG tablet, Take 60 mg by mouth as needed (high heart rate).   diltiazem (TIADYLT ER) 120 MG 24 hr capsule, Take 1 capsule by mouth daily.  Current Outpatient Medications (Respiratory):    chlorpheniramine-HYDROcodone (TUSSIONEX) 10-8 MG/5ML, Take 5 mLs by mouth every 12 (twelve) hours as needed for cough.   albuterol (PROAIR HFA) 108 (90 Base) MCG/ACT inhaler, Inhale 1-2 puffs into the lungs every 6 (six) hours as needed for wheezing or shortness of breath. (Patient not taking: Reported on 12/14/2023)  Current Outpatient Medications (Analgesics):    ibuprofen (ADVIL) 600 MG tablet, Take 1 tablet (600 mg total) by mouth every 6 (six) hours as needed for fever or mild pain (pain score 1-3). (Patient not taking: Reported on 12/14/2023)   Current  Outpatient Medications (Other):    silver sulfADIAZINE (SILVADENE) 1 % cream, Apply topically daily. Cleanse foot wound with water, pat dry. Apply Silvadene daily. Top with dry dressing   clindamycin (CLEOCIN T) 1 % lotion, Apply topically 2 (two) times daily. (Patient not taking: Reported on 12/14/2023)   doxycycline (VIBRA-TABS) 100 MG tablet, Take 1 tablet (100 mg total) by mouth 2 (two) times daily. (Patient not taking: Reported on 12/14/2023)   erlotinib (TARCEVA) 150 MG tablet, Take 1 tablet (150 mg total) by mouth daily. Take on an empty stomach at least 1 hour before or 2 hours after food. (Patient not taking: Reported on 12/14/2023)   prochlorperazine (COMPAZINE) 10 MG tablet, Take 1 tablet (10 mg total) by mouth  every 6 (six) hours as needed. (Patient not taking: Reported on 12/14/2023)  Medical History:  Past Medical History:  Diagnosis Date   Drug-induced skin rash 02/27/2017   Encounter for antineoplastic chemotherapy 01/19/2016   History of radiation therapy 05/02/11 to 06/08/11   right lung   Lung cancer (HCC)    right lower lobe adenocarcinoma   Allergies:  Allergies  Allergen Reactions   Codeine Nausea Only   Doxycycline Nausea And Vomiting    vomiting and headache     Surgical History:  She  has a past surgical history that includes Lung lobectomy (04/18/2009); Portacath placement (02/29/2012); Bronchial brushings (09/05/2023); Bronchial biopsy (09/05/2023); Bronchial needle aspiration biopsy (09/05/2023); and Fiducial marker placement (09/05/2023). Family History:  Her family history includes Heart Problems in her mother.  REVIEW OF SYSTEMS  : All other systems reviewed and negative except where noted in the History of Present Illness.  PHYSICAL EXAM: BP 120/80   Pulse 90   Ht 4\' 10"  (1.473 m)   Wt 112 lb (50.8 kg)   LMP 02/10/2012   BMI 23.41 kg/m  General Appearance: Well nourished, in no apparent distress. Head:   Normocephalic and atraumatic. Eyes:  sclerae  anicteric,conjunctive pink  Respiratory: Respiratory effort normal, BS equal bilaterally without rales, rhonchi, wheezing. Cardio: RRR with no MRGs. Peripheral pulses intact.  Abdomen: Soft,  Non-distended ,active bowel sounds. No tenderness . Without guarding and Without rebound. No masses. Rectal: Not evaluated Musculoskeletal: Full ROM, Normal gait. Without edema. Skin:  Dry and intact without significant lesions or rashes Neuro: Alert and  oriented x4;  No focal deficits. Psych:  Cooperative. Normal mood and affect.    Doree Albee, PA-C 10:15 AM

## 2023-12-14 ENCOUNTER — Encounter: Payer: Self-pay | Admitting: Physician Assistant

## 2023-12-14 ENCOUNTER — Ambulatory Visit: Payer: Medicare Other | Admitting: Physician Assistant

## 2023-12-14 VITALS — BP 120/80 | HR 90 | Ht <= 58 in | Wt 112.0 lb

## 2023-12-14 DIAGNOSIS — C3431 Malignant neoplasm of lower lobe, right bronchus or lung: Secondary | ICD-10-CM

## 2023-12-14 DIAGNOSIS — R1319 Other dysphagia: Secondary | ICD-10-CM | POA: Diagnosis not present

## 2023-12-14 DIAGNOSIS — K219 Gastro-esophageal reflux disease without esophagitis: Secondary | ICD-10-CM

## 2023-12-14 DIAGNOSIS — Z1211 Encounter for screening for malignant neoplasm of colon: Secondary | ICD-10-CM

## 2023-12-14 NOTE — Patient Instructions (Signed)
You have been scheduled for an endoscopy. Please follow written instructions given to you at your visit today.  If you use inhalers (even only as needed), please bring them with you on the day of your procedure.  If you take any of the following medications, they will need to be adjusted prior to your procedure:   DO NOT TAKE 7 DAYS PRIOR TO TEST- Trulicity (dulaglutide) Ozempic, Wegovy (semaglutide) Mounjaro (tirzepatide) Bydureon Bcise (exanatide extended release)  DO NOT TAKE 1 DAY PRIOR TO YOUR TEST Rybelsus (semaglutide) Adlyxin (lixisenatide) Victoza (liraglutide) Byetta (exanatide) ___________________________________________________________________________

## 2023-12-18 ENCOUNTER — Encounter: Payer: Self-pay | Admitting: Gastroenterology

## 2023-12-18 ENCOUNTER — Ambulatory Visit (AMBULATORY_SURGERY_CENTER): Payer: Medicare Other | Admitting: Gastroenterology

## 2023-12-18 VITALS — BP 97/54 | HR 66 | Temp 98.8°F | Resp 26 | Ht <= 58 in | Wt 112.0 lb

## 2023-12-18 DIAGNOSIS — K222 Esophageal obstruction: Secondary | ICD-10-CM | POA: Diagnosis not present

## 2023-12-18 DIAGNOSIS — K219 Gastro-esophageal reflux disease without esophagitis: Secondary | ICD-10-CM | POA: Diagnosis not present

## 2023-12-18 DIAGNOSIS — K449 Diaphragmatic hernia without obstruction or gangrene: Secondary | ICD-10-CM | POA: Diagnosis not present

## 2023-12-18 DIAGNOSIS — R1310 Dysphagia, unspecified: Secondary | ICD-10-CM | POA: Diagnosis not present

## 2023-12-18 DIAGNOSIS — R1319 Other dysphagia: Secondary | ICD-10-CM

## 2023-12-18 MED ORDER — SODIUM CHLORIDE 0.9 % IV SOLN: 500.0000 mL | Freq: Once | INTRAVENOUS | Status: DC

## 2023-12-18 NOTE — Progress Notes (Signed)
Called to room to assist during endoscopic procedure.  Patient ID and intended procedure confirmed with present staff. Received instructions for my participation in the procedure from the performing physician.  

## 2023-12-18 NOTE — Progress Notes (Signed)
Pt's states no medical or surgical changes since previsit or office visit. 

## 2023-12-18 NOTE — Progress Notes (Signed)
History and Physical Interval Note:  12/18/2023 10:08 AM  [name]  has presented today for endoscopic procedure(s), with the diagnosis of  Encounter Diagnoses  Name Primary?   Esophageal dysphagia Yes   Gastroesophageal reflux disease without esophagitis   .  The various methods of evaluation and treatment have been discussed with the patient and/or family. After consideration of risks, benefits and other options for treatment, the patient has consented to  the endoscopic procedure(s).   The patient's history has been reviewed, patient examined, no change in status, stable for endoscopic procedure(s).  I have reviewed the patient's chart and labs.  Questions were answered to the patient's satisfaction.     Analyn Matusek E. Tomasa Rand, MD [facility]

## 2023-12-18 NOTE — Op Note (Signed)
[facility] Patient Name: [name] Procedure Date: 12/18/2023 10:07 AM MRN: [id number] Endoscopist: Lorin Picket E. Tomasa Rand , MD, [id number] Age: 63 Referring MD:  Date of Birth: 07/20/1961 Gender: Female Account #: [id number] Procedure:                Upper GI endoscopy Indications:              Dysphagia Medicines:                Monitored Anesthesia Care Procedure:                Pre-Anesthesia Assessment:                           - Prior to the procedure, a History and Physical                            was performed, and patient medications and                            allergies were reviewed. The patient's tolerance of                            previous anesthesia was also reviewed. The risks                            and benefits of the procedure and the sedation                            options and risks were discussed with the patient.                            All questions were answered, and informed consent                            was obtained. Prior Anticoagulants: The patient has                            taken no anticoagulant or antiplatelet agents. ASA                            Grade Assessment: III - A patient with severe                            systemic disease. After reviewing the risks and                            benefits, the patient was deemed in satisfactory                            condition to undergo the procedure.                           After obtaining informed consent, the endoscope was  passed under direct vision. Throughout the                            procedure, the patient's blood pressure, pulse, and                            oxygen saturations were monitored continuously. The                            Olympus Scope [id number] was introduced through the                            mouth, and advanced to the second part of duodenum.                            The upper GI endoscopy was accomplished  without                            difficulty. The patient tolerated the procedure                            well. Scope In: Scope Out: Findings:                 The examined portions of the nasopharynx,                            oropharynx and larynx were normal.                           One benign-appearing, intrinsic moderate stenosis                            was found 26 to 29 cm from the incisors. This                            stenosis measured 1.1 cm (inner diameter) x 3 cm                            (in length). Prominent neovascularization was seen.                            The stenosis was traversed with mild resistance.                            Biopsies were taken with a cold forceps for                            histology. Estimated blood loss was minimal.                           The exam of the esophagus was otherwise normal.                           A 3 cm  hiatal hernia was present.                           A small amount of food (residue) was found in the                            gastric body.                           The exam of the stomach was otherwise normal.                           Food (residue) was found in the duodenal bulb and                            in the second portion of the duodenum.                           The exam of the duodenum was otherwise normal. Complications:            No immediate complications. Estimated Blood Loss:     Estimated blood loss was minimal. Impression:               - The examined portions of the nasopharynx,                            oropharynx and larynx were normal.                           - Benign-appearing esophageal stenosis, consistent                            with radiation-induced stricture. Biopsied. This                            was not dilated due to perceived elevated risk of                            complications based on the length and                            hypervascularity of the  stenosis, as well as the                            stability of the patient's symptoms                           - 3 cm hiatal hernia.                           - A small amount of food (residue) in the stomach.                           - Retained food in the duodenum. Recommendation:           - Patient has  a contact number available for                            emergencies. The signs and symptoms of potential                            delayed complications were discussed with the                            patient. Return to normal activities tomorrow.                            Written discharge instructions were provided to the                            patient.                           - Resume previous diet.                           - Continue present medications.                           - Await pathology results.                           - Will rediscuss with the patient whether she would                            like to consider a dilation of the stricture. Alexx Mcburney E. Tomasa Rand, MD 12/18/2023 10:43:11 AM This report has been signed electronically.

## 2023-12-18 NOTE — Progress Notes (Signed)
Sedate, gd SR, tolerated procedure well, VSS, report to RN 

## 2023-12-18 NOTE — Patient Instructions (Signed)
 Please read handouts provided. Continue present medications.  Await pathology results. Resume previous diet.   YOU HAD AN ENDOSCOPIC PROCEDURE TODAY AT THE [facility]:   Refer to the procedure report that was given to you for any specific questions about what was found during the examination.  If the procedure report does not answer your questions, please call your gastroenterologist to clarify.  If you requested that your care partner not be given the details of your procedure findings, then the procedure report has been included in a sealed envelope for you to review at your convenience later.  YOU SHOULD EXPECT: Some feelings of bloating in the abdomen. Passage of more gas than usual.  Walking can help get rid of the air that was put into your GI tract during the procedure and reduce the bloating. If you had a lower endoscopy (such as a colonoscopy or flexible sigmoidoscopy) you may notice spotting of blood in your stool or on the toilet paper. If you underwent a bowel prep for your procedure, you may not have a normal bowel movement for a few days.  Please Note:  You might notice some irritation and congestion in your nose or some drainage.  This is from the oxygen used during your procedure.  There is no need for concern and it should clear up in a day or so.  SYMPTOMS TO REPORT IMMEDIATELY:  Following upper endoscopy (EGD)  Vomiting of blood or coffee ground material  New chest pain or pain under the shoulder blades  Painful or persistently difficult swallowing  New shortness of breath  Fever of 100F or higher  Black, tarry-looking stools  For urgent or emergent issues, a gastroenterologist can be reached at any hour by calling [phone]. Do not use MyChart messaging for urgent concerns.    DIET:  We do recommend a small meal at first, but then you may proceed to your regular diet.  Drink plenty of fluids but you should avoid alcoholic beverages for 24  hours.  ACTIVITY:  You should plan to take it easy for the rest of today and you should NOT DRIVE or use heavy machinery until tomorrow (because of the sedation medicines used during the test).    FOLLOW UP: Our staff will call the number listed on your records the next business day following your procedure.  We will call around 7:15- 8:00 am to check on you and address any questions or concerns that you may have regarding the information given to you following your procedure. If we do not reach you, we will leave a message.     If any biopsies were taken you will be contacted by phone or by letter within the next 1-3 weeks.  Please call us at [phone] if you have not heard about the biopsies in 3 weeks.    SIGNATURES/CONFIDENTIALITY: You and/or your care partner have signed paperwork which will be entered into your electronic medical record.  These signatures attest to the fact that that the information above on your After Visit Summary has been reviewed and is understood.  Full responsibility of the confidentiality of this discharge information lies with you and/or your care-partner.

## 2023-12-19 ENCOUNTER — Telehealth: Payer: Self-pay | Admitting: *Deleted

## 2023-12-19 NOTE — Telephone Encounter (Signed)
  Follow up Call-     12/18/2023    9:38 AM  Call back number  Post procedure Call Back phone  # [phone]  Permission to leave phone message Yes     Patient questions:  Do you have a fever, pain , or abdominal swelling? No. Pain Score  0 *  Have you tolerated food without any problems? Yes.    Have you been able to return to your normal activities? Yes.    Do you have any questions about your discharge instructions: Diet   No. Medications  No. Follow up visit  No.  Do you have questions or concerns about your Care? No.  Actions: * If pain score is 4 or above: No action needed, pain <4.

## 2023-12-20 ENCOUNTER — Encounter: Payer: Self-pay | Admitting: Gastroenterology

## 2023-12-20 LAB — SURGICAL PATHOLOGY

## 2023-12-20 NOTE — Progress Notes (Signed)
[name],  The biopsies of the narrowing in your esophagus showed benign mucosa.  There was no evidence of cancer or malignancy. As we discussed, the narrowing in your esophagus is related to your history of radiation therapy for your lung cancer.  It was not dilated because it was thought to be potentially high risk of complications given its length and the significant number of blood vessels in the stricture. Should your symptoms worsen, I would gladly reconsider dilating your stricture.  Please contact our office if you would like to reconsider a dilation.

## 2023-12-20 NOTE — Progress Notes (Signed)
Agree with the assessment and plan as outlined by Amanda Collier,  PA-C.  Scott E. Cunningham, MD  

## 2023-12-25 ENCOUNTER — Ambulatory Visit: Payer: Medicare Other | Admitting: Emergency Medicine

## 2023-12-25 ENCOUNTER — Other Ambulatory Visit: Payer: Self-pay

## 2023-12-25 ENCOUNTER — Encounter: Payer: Self-pay | Admitting: Emergency Medicine

## 2023-12-25 VITALS — BP 118/74 | HR 94 | Temp 98.0°F | Ht <= 58 in | Wt 115.0 lb

## 2023-12-25 DIAGNOSIS — K219 Gastro-esophageal reflux disease without esophagitis: Secondary | ICD-10-CM | POA: Diagnosis not present

## 2023-12-25 DIAGNOSIS — C3431 Malignant neoplasm of lower lobe, right bronchus or lung: Secondary | ICD-10-CM | POA: Diagnosis not present

## 2023-12-25 DIAGNOSIS — I498 Other specified cardiac arrhythmias: Secondary | ICD-10-CM

## 2023-12-25 DIAGNOSIS — I10 Essential (primary) hypertension: Secondary | ICD-10-CM | POA: Diagnosis not present

## 2023-12-25 MED ORDER — DILTIAZEM HCL ER BEADS 120 MG PO CP24: 120.0000 mg | ORAL_CAPSULE | Freq: Every day | ORAL | 3 refills | Status: DC

## 2023-12-25 MED ORDER — DILTIAZEM HCL ER BEADS 120 MG PO CP24
120.0000 mg | ORAL_CAPSULE | Freq: Every day | ORAL | 3 refills | Status: DC
  Filled 2023-12-25: qty 90, 90d supply, fill #0

## 2023-12-25 NOTE — Assessment & Plan Note (Signed)
Recent endoscopy in 12/18/2023 Some esophageal stenosis believed to be secondary to radiation therapy No other significant finding Report reviewed

## 2023-12-25 NOTE — Assessment & Plan Note (Signed)
Stable.  Sees oncologist on a regular basis Continues Tarceva 150 mg daily Continues radiation treatment and chemotherapy

## 2023-12-25 NOTE — Progress Notes (Signed)
[name] 63 y.o.   Chief Complaint  Patient presents with   Follow-up    6 month f/u for HTN. Patient states 2 day ago she started having headache that come and go. Pt c/o of rashes on her arms, legs and eyes.     HISTORY OF PRESENT ILLNESS: Accompanied by interpreter This is a 63 y.o. female here for 8-month follow-up of chronic medical conditions including hypertension Also has history of lung cancer.  Getting radiation and chemotherapy treatments Was having headaches last week but not this week Since starting radiation treatment has been getting rashes to extremities and face No other complaints or medical concerns today BP Readings from Last 3 Encounters:  12/18/23 (!) 97/54  12/14/23 120/80  11/20/23 132/75     HPI   Prior to Admission medications   Medication Sig Start Date End Date Taking? Authorizing Provider  chlorpheniramine-HYDROcodone (TUSSIONEX) 10-8 MG/5ML Take 5 mLs by mouth every 12 (twelve) hours as needed for cough. 11/20/23  Yes Heilingoetter, Cassandra L, PA-C  diltiazem (CARDIZEM) 60 MG tablet Take 60 mg by mouth as needed (high heart rate).   Yes [provider]  diltiazem (TIADYLT ER) 120 MG 24 hr capsule Take 1 capsule by mouth daily. 10/30/23  Yes Margaretmary Dys, MD  erlotinib (TARCEVA) 150 MG tablet Take 1 tablet (150 mg total) by mouth daily. Take on an empty stomach at least 1 hour before or 2 hours after food. 10/04/23  Yes Si Gaul, MD  silver sulfADIAZINE (SILVADENE) 1 % cream Apply topically daily. Cleanse foot wound with water, pat dry. Apply Silvadene daily. Top with dry dressing 10/23/23  Yes [name], MD  albuterol [facility] HFA) 108 (90 Base) MCG/ACT inhaler Inhale 1-2 puffs into the lungs every 6 (six) hours as needed for wheezing or shortness of breath. Patient not taking: Reported on 12/14/2023 06/19/23   [name], MD  clindamycin (CLEOCIN T) 1 % lotion Apply topically 2 (two) times daily. Patient not taking:  Reported on 12/25/2023 11/20/23   Heilingoetter, Cassandra L, PA-C  doxycycline (VIBRA-TABS) 100 MG tablet Take 1 tablet (100 mg total) by mouth 2 (two) times daily. Patient not taking: Reported on 12/14/2023 11/20/23   Heilingoetter, Cassandra L, PA-C  ibuprofen (ADVIL) 600 MG tablet Take 1 tablet (600 mg total) by mouth every 6 (six) hours as needed for fever or mild pain (pain score 1-3). Patient not taking: Reported on 12/14/2023 10/21/23   Nira Conn, MD  prochlorperazine (COMPAZINE) 10 MG tablet Take 1 tablet (10 mg total) by mouth every 6 (six) hours as needed. Patient not taking: Reported on 12/14/2023 11/20/23   Heilingoetter, Cassandra L, PA-C    Allergies  Allergen Reactions   Codeine Nausea Only   Doxycycline Nausea And Vomiting    vomiting and headache    Patient Active Problem List   Diagnosis Date Noted   Pulmonary nodule 09/05/2023   Lung mass 08/21/2023   Seasonal allergic rhinitis due to pollen 06/15/2017   Essential hypertension 05/23/2017   Atrial arrhythmia 05/21/2017   Port catheter in place 12/27/2016   Encounter for antineoplastic chemotherapy 06/13/2016   GERD (gastroesophageal reflux disease) 04/18/2016   Primary cancer of right lower lobe of lung (HCC) 05/06/2009   LEIOMYOMA, UTERUS 05/06/2009    Past Medical History:  Diagnosis Date   Drug-induced skin rash 02/27/2017   Encounter for antineoplastic chemotherapy 01/19/2016   History of radiation therapy 05/02/11 to 06/08/11   right lung   Lung cancer (HCC)  right lower lobe adenocarcinoma    Past Surgical History:  Procedure Laterality Date   BRONCHIAL BIOPSY  09/05/2023   Procedure: BRONCHIAL BIOPSIES;  Surgeon: Leslye Peer, MD;  Location: [facility] ENDOSCOPY;  Service: Pulmonary;;   BRONCHIAL BRUSHINGS  09/05/2023   Procedure: BRONCHIAL BRUSHINGS;  Surgeon: Leslye Peer, MD;  Location: [facility] ENDOSCOPY;  Service: Pulmonary;;   BRONCHIAL NEEDLE ASPIRATION BIOPSY  09/05/2023   Procedure:  BRONCHIAL NEEDLE ASPIRATION BIOPSIES;  Surgeon: Leslye Peer, MD;  Location: [facility] ENDOSCOPY;  Service: Pulmonary;;   FIDUCIAL MARKER PLACEMENT  09/05/2023   Procedure: FIDUCIAL MARKER PLACEMENT;  Surgeon: Leslye Peer, MD;  Location: [facility] ENDOSCOPY;  Service: Pulmonary;;   LUNG LOBECTOMY  04/18/2009   RLL   PORTACATH PLACEMENT  02/29/2012   Procedure: INSERTION PORT-A-CATH;  Surgeon: Ines Bloomer, MD;  Location: [facility] OR;  Service: Thoracic;  Laterality: Left;  PowerPort 8 F attachable in left internal jugular.    Social History   Socioeconomic History   Marital status: Married    Spouse name: Not on file   Number of children: 7   Years of education: Not on file   Highest education level: Not on file  Occupational History   Occupation: stay at home mom  Tobacco Use   Smoking status: Never   Smokeless tobacco: Never  Vaping Use   Vaping status: Never Used  Substance and Sexual Activity   Alcohol use: No   Drug use: No   Sexual activity: Yes  Other Topics Concern   Not on file  Social History Narrative   09/01/2014   The patient is a 63 year old [place] woman.   The patient is married and has 7 children. One child passed away.   The patient came to the [place] in approximately 1990. They were in [place] for 10-11 years and then moved to [place], [place] since then.   Patient does not smoke, drink, or use illicit drugs.   Patient has not used smokeless tobacco products.   Fun/Hobby: Go shopping    Social Drivers of Health   Financial Resource Strain: Not on file  Food Insecurity: No Food Insecurity (10/22/2023)   Hunger Vital Sign    Worried About Running Out of Food in the Last Year: Never true    Ran Out of Food in the Last Year: Never true  Transportation Needs: No Transportation Needs (10/22/2023)   PRAPARE - Administrator, Civil Service (Medical): No    Lack of Transportation (Non-Medical): No  Physical Activity: Not on file  Stress:  Not on file  Social Connections: Not on file  Intimate Partner Violence: Not At Risk (10/22/2023)   Humiliation, Afraid, Rape, and Kick questionnaire    Fear of Current or Ex-Partner: No    Emotionally Abused: No    Physically Abused: No    Sexually Abused: No    Family History  Problem Relation Age of Onset   Heart Problems Mother    Liver disease Neg Hx    Colon cancer Neg Hx    Esophageal cancer Neg Hx      Review of Systems  Constitutional: Negative.  Negative for chills and fever.  HENT: Negative.  Negative for congestion and sore throat.   Respiratory:  Positive for cough. Negative for shortness of breath.   Cardiovascular: Negative.  Negative for chest pain and palpitations.  Gastrointestinal:  Negative for abdominal pain, diarrhea, nausea and vomiting.  Genitourinary: Negative.  Negative for dysuria  and hematuria.  Skin:  Positive for rash.  Neurological:  Positive for headaches. Negative for dizziness.  All other systems reviewed and are negative.   Today's Vitals   12/25/23 1309  BP: 118/74  Pulse: 94  Temp: 98 F (36.7 C)  TempSrc: Oral  SpO2: 96%  Weight: 115 lb (52.2 kg)  Height: 4\' 10"  (1.473 m)   Body mass index is 24.04 kg/m.   Physical Exam Vitals reviewed.  Constitutional:      Appearance: Normal appearance.  HENT:     Head: Normocephalic.     Mouth/Throat:     Mouth: Mucous membranes are moist.     Pharynx: Oropharynx is clear.  Eyes:     Extraocular Movements: Extraocular movements intact.     Conjunctiva/sclera: Conjunctivae normal.     Pupils: Pupils are equal, round, and reactive to light.  Cardiovascular:     Rate and Rhythm: Normal rate and regular rhythm.     Pulses: Normal pulses.     Heart sounds: Murmur heard.  Pulmonary:     Effort: Pulmonary effort is normal.     Breath sounds: Normal breath sounds.  Abdominal:     Palpations: Abdomen is soft.     Tenderness: There is no abdominal tenderness.  Musculoskeletal:      Cervical back: No tenderness.  Lymphadenopathy:     Cervical: No cervical adenopathy.  Skin:    General: Skin is warm and dry.     Comments: Dry skin with scaly rash to forearms and lower legs  Neurological:     Mental Status: She is alert and oriented to person, place, and time.  Psychiatric:        Mood and Affect: Mood normal.        Behavior: Behavior normal.      ASSESSMENT & PLAN: A total of 47 minutes was spent with the patient and counseling/coordination of care regarding preparing for this visit, review of most recent office visit notes, review of most recent oncologist office visit notes, review of most recent upper endoscopy report, review of multiple chronic medical conditions and their management, review of all medications, review of most recent bloodwork results, review of health maintenance items, education on nutrition, prognosis, documentation, and need for follow up.   Problem List Items Addressed This Visit       Cardiovascular and Mediastinum   Atrial arrhythmia   Stable.  Recently evaluated by cardiologist. Office notes, assessment and plan reviewed.      Relevant Medications   diltiazem (TIADYLT ER) 120 MG 24 hr capsule   Essential hypertension - Primary   Well-controlled hypertension Continue diltiazem 120 mg daily      Relevant Medications   diltiazem (TIADYLT ER) 120 MG 24 hr capsule     Respiratory   Primary cancer of right lower lobe of lung (HCC)   Stable.  Sees oncologist on a regular basis Continues Tarceva 150 mg daily Continues radiation treatment and chemotherapy        Digestive   GERD (gastroesophageal reflux disease)   Recent endoscopy in 12/18/2023 Some esophageal stenosis believed to be secondary to radiation therapy No other significant finding Report reviewed      Patient Instructions  Health Maintenance, Female Adopting a healthy lifestyle and getting preventive care are important in promoting health and wellness. Ask  your health care provider about: The right schedule for you to have regular tests and exams. Things you can do on your own to prevent diseases and  keep yourself healthy. What should I know about diet, weight, and exercise? Eat a healthy diet  Eat a diet that includes plenty of vegetables, fruits, low-fat dairy products, and lean protein. Do not eat a lot of foods that are high in solid fats, added sugars, or sodium. Maintain a healthy weight Body mass index (BMI) is used to identify weight problems. It estimates body fat based on height and weight. Your health care provider can help determine your BMI and help you achieve or maintain a healthy weight. Get regular exercise Get regular exercise. This is one of the most important things you can do for your health. Most adults should: Exercise for at least 150 minutes each week. The exercise should increase your heart rate and make you sweat (moderate-intensity exercise). Do strengthening exercises at least twice a week. This is in addition to the moderate-intensity exercise. Spend less time sitting. Even light physical activity can be beneficial. Watch cholesterol and blood lipids Have your blood tested for lipids and cholesterol at 62 years of age, then have this test every 5 years. Have your cholesterol levels checked more often if: Your lipid or cholesterol levels are high. You are older than 62 years of age. You are at high risk for heart disease. What should I know about cancer screening? Depending on your health history and family history, you may need to have cancer screening at various ages. This may include screening for: Breast cancer. Cervical cancer. Colorectal cancer. Skin cancer. Lung cancer. What should I know about heart disease, diabetes, and high blood pressure? Blood pressure and heart disease High blood pressure causes heart disease and increases the risk of stroke. This is more likely to develop in people who have high  blood pressure readings or are overweight. Have your blood pressure checked: Every 3-5 years if you are 32-66 years of age. Every year if you are 76 years old or older. Diabetes Have regular diabetes screenings. This checks your fasting blood sugar level. Have the screening done: Once every three years after age 62 if you are at a normal weight and have a low risk for diabetes. More often and at a younger age if you are overweight or have a high risk for diabetes. What should I know about preventing infection? Hepatitis B If you have a higher risk for hepatitis B, you should be screened for this virus. Talk with your health care provider to find out if you are at risk for hepatitis B infection. Hepatitis C Testing is recommended for: Everyone born from [birth year] through 1964. Anyone with known risk factors for hepatitis C. Sexually transmitted infections (STIs) Get screened for STIs, including gonorrhea and chlamydia, if: You are sexually active and are younger than 62 years of age. You are older than 62 years of age and your health care provider tells you that you are at risk for this type of infection. Your sexual activity has changed since you were last screened, and you are at increased risk for chlamydia or gonorrhea. Ask your health care provider if you are at risk. Ask your health care provider about whether you are at high risk for HIV. Your health care provider may recommend a prescription medicine to help prevent HIV infection. If you choose to take medicine to prevent HIV, you should first get tested for HIV. You should then be tested every 3 months for as long as you are taking the medicine. Pregnancy If you are about to stop having your period (premenopausal) and  you may become pregnant, seek counseling before you get pregnant. Take 400 to 800 micrograms (mcg) of folic acid every day if you become pregnant. Ask for birth control (contraception) if you want to prevent  pregnancy. Osteoporosis and menopause Osteoporosis is a disease in which the bones lose minerals and strength with aging. This can result in bone fractures. If you are 24 years old or older, or if you are at risk for osteoporosis and fractures, ask your health care provider if you should: Be screened for bone loss. Take a calcium or vitamin D supplement to lower your risk of fractures. Be given hormone replacement therapy (HRT) to treat symptoms of menopause. Follow these instructions at home: Alcohol use Do not drink alcohol if: Your health care provider tells you not to drink. You are pregnant, may be pregnant, or are planning to become pregnant. If you drink alcohol: Limit how much you have to: 0-1 drink a day. Know how much alcohol is in your drink. In the [place] one drink equals one 12 oz bottle of beer (355 mL), one 5 oz glass of wine (148 mL), or one 1 oz glass of hard liquor (44 mL). Lifestyle Do not use any products that contain nicotine or tobacco. These products include cigarettes, chewing tobacco, and vaping devices, such as e-cigarettes. If you need help quitting, ask your health care provider. Do not use street drugs. Do not share needles. Ask your health care provider for help if you need support or information about quitting drugs. General instructions Schedule regular health, dental, and eye exams. Stay current with your vaccines. Tell your health care provider if: You often feel depressed. You have ever been abused or do not feel safe at home. Summary Adopting a healthy lifestyle and getting preventive care are important in promoting health and wellness. Follow your health care provider's instructions about healthy diet, exercising, and getting tested or screened for diseases. Follow your health care provider's instructions on monitoring your cholesterol and blood pressure. This information is not intended to replace advice given to you by your health care provider.  Make sure you discuss any questions you have with your health care provider. Document Revised: 04/05/2021 Document Reviewed: 04/05/2021 Elsevier Patient Education  2024 Elsevier Inc.     Edwina Barth, MD [facility]

## 2023-12-25 NOTE — Assessment & Plan Note (Signed)
Stable.  Recently evaluated by cardiologist. Office notes, assessment and plan reviewed.

## 2023-12-25 NOTE — Patient Instructions (Signed)

## 2023-12-25 NOTE — Assessment & Plan Note (Signed)
Well-controlled hypertension Continue diltiazem 120 mg daily

## 2024-01-01 ENCOUNTER — Inpatient Hospital Stay: Payer: Medicare Other | Attending: Internal Medicine

## 2024-01-01 ENCOUNTER — Ambulatory Visit (HOSPITAL_COMMUNITY)
Admission: RE | Admit: 2024-01-01 | Discharge: 2024-01-01 | Disposition: A | Discharge status: Home / Self Care | Payer: Medicare Other | Source: Ambulatory Visit | Attending: Physician Assistant | Admitting: Physician Assistant

## 2024-01-01 ENCOUNTER — Encounter (HOSPITAL_COMMUNITY): Payer: Self-pay

## 2024-01-01 ENCOUNTER — Other Ambulatory Visit: Payer: Medicare Other

## 2024-01-01 DIAGNOSIS — R059 Cough, unspecified: Secondary | ICD-10-CM | POA: Insufficient documentation

## 2024-01-01 DIAGNOSIS — C3431 Malignant neoplasm of lower lobe, right bronchus or lung: Secondary | ICD-10-CM | POA: Insufficient documentation

## 2024-01-01 DIAGNOSIS — C3491 Malignant neoplasm of unspecified part of right bronchus or lung: Secondary | ICD-10-CM | POA: Insufficient documentation

## 2024-01-01 DIAGNOSIS — I700 Atherosclerosis of aorta: Secondary | ICD-10-CM | POA: Diagnosis not present

## 2024-01-01 DIAGNOSIS — Z79899 Other long term (current) drug therapy: Secondary | ICD-10-CM | POA: Diagnosis not present

## 2024-01-01 DIAGNOSIS — C3411 Malignant neoplasm of upper lobe, right bronchus or lung: Secondary | ICD-10-CM | POA: Diagnosis not present

## 2024-01-01 DIAGNOSIS — J984 Other disorders of lung: Secondary | ICD-10-CM | POA: Diagnosis not present

## 2024-01-01 DIAGNOSIS — J90 Pleural effusion, not elsewhere classified: Secondary | ICD-10-CM | POA: Diagnosis not present

## 2024-01-01 LAB — CMP (CANCER CENTER ONLY)
ALT: 15 U/L (ref 0–44)
AST: 17 U/L (ref 15–41)
Albumin: 4 g/dL (ref 3.5–5.0)
Alkaline Phosphatase: 96 U/L (ref 38–126)
Anion gap: 6 (ref 5–15)
BUN: 8 mg/dL (ref 8–23)
CO2: 32 mmol/L (ref 22–32)
Calcium: 9.2 mg/dL (ref 8.9–10.3)
Chloride: 104 mmol/L (ref 98–111)
Creatinine: 0.47 mg/dL (ref 0.44–1.00)
GFR, Estimated: >60 mL/min (ref >60)
Glucose, Bld: 100 mg/dL — ABNORMAL HIGH (ref 70–99)
Potassium: 3.8 mmol/L (ref 3.5–5.1)
Sodium: 142 mmol/L (ref 135–145)
Total Bilirubin: 1.3 mg/dL — ABNORMAL HIGH (ref 0.0–1.2)
Total Protein: 7.3 g/dL (ref 6.5–8.1)

## 2024-01-01 LAB — CBC WITH DIFFERENTIAL (CANCER CENTER ONLY)
Abs Immature Granulocytes: 0.05 10*3/uL (ref 0.00–0.07)
Basophils Absolute: 0 10*3/uL (ref 0.0–0.1)
Basophils Relative: 0 %
Eosinophils Absolute: 0.1 10*3/uL (ref 0.0–0.5)
Eosinophils Relative: 1 %
HCT: 37.2 % (ref 36.0–46.0)
Hemoglobin: 12.3 g/dL (ref 12.0–15.0)
Immature Granulocytes: 0 %
Lymphocytes Relative: 11 %
Lymphs Abs: 1.3 10*3/uL (ref 0.7–4.0)
MCH: 29.4 pg (ref 26.0–34.0)
MCHC: 33.1 g/dL (ref 30.0–36.0)
MCV: 89 fL (ref 80.0–100.0)
Monocytes Absolute: 0.9 10*3/uL (ref 0.1–1.0)
Monocytes Relative: 8 %
Neutro Abs: 9.5 10*3/uL — ABNORMAL HIGH (ref 1.7–7.7)
Neutrophils Relative %: 80 %
Platelet Count: 329 10*3/uL (ref 150–400)
RBC: 4.18 MIL/uL (ref 3.87–5.11)
RDW: 14.1 % (ref 11.5–15.5)
WBC Count: 11.8 10*3/uL — ABNORMAL HIGH (ref 4.0–10.5)
nRBC: 0 % (ref 0.0–0.2)

## 2024-01-01 MED ORDER — IOHEXOL 300 MG/ML  SOLN
60.0000 mL | Freq: Once | INTRAMUSCULAR | Status: AC | PRN
  Administered 2024-01-01: 60 mL via INTRAVENOUS

## 2024-01-04 ENCOUNTER — Other Ambulatory Visit: Payer: Self-pay | Admitting: Emergency Medicine

## 2024-01-04 NOTE — Telephone Encounter (Signed)
 Copied from CRM [id number]. Topic: Clinical - Medication Refill >> Jan 04, 2024 12:46 PM Evie B wrote: Most Recent Primary Care Visit:  Provider: PURCELL EMIL SCHANZ  Department: [facility]  Visit Type: OFFICE VISIT  Date: 12/25/2023  Medication: diltiazem  (TIADYLT  ER) 120 MG 24  Has the patient contacted their pharmacy? Yes (Agent: If no, request that the patient contact the pharmacy for the refill. If patient does not wish to contact the pharmacy document the reason why and proceed with request.) (Agent: If yes, when and what did the pharmacy advise?)  Is this the correct pharmacy for this prescription? Yes If no, delete pharmacy and type the correct one.  This is the patient's preferred pharmacy:    [facility] [address], [place] - [address] [place] [address] Phone: [phone] Fax: [phone]   Has the prescription been filled recently? Yes  Is the patient out of the medication? Yes  Has the patient been seen for an appointment in the last year OR does the patient have an upcoming appointment? Yes  Can we respond through MyChart? Yes  Agent: Please be advised that Rx refills may take up to 3 business days. We ask that you follow-up with your pharmacy.

## 2024-01-09 ENCOUNTER — Inpatient Hospital Stay (HOSPITAL_BASED_OUTPATIENT_CLINIC_OR_DEPARTMENT_OTHER): Payer: Medicare Other | Admitting: Internal Medicine

## 2024-01-09 DIAGNOSIS — R059 Cough, unspecified: Secondary | ICD-10-CM | POA: Diagnosis not present

## 2024-01-09 DIAGNOSIS — C3431 Malignant neoplasm of lower lobe, right bronchus or lung: Secondary | ICD-10-CM

## 2024-01-09 DIAGNOSIS — Z79899 Other long term (current) drug therapy: Secondary | ICD-10-CM | POA: Diagnosis not present

## 2024-01-09 MED ORDER — HYDROCOD POLI-CHLORPHE POLI ER 10-8 MG/5ML PO SUER: 5.0000 mL | Freq: Two times a day (BID) | ORAL | 0 refills | Status: DC | PRN

## 2024-01-09 NOTE — Progress Notes (Signed)
#        [facility] [phone]   [phone]  OFFICE PROGRESS NOTE  [name], MD [address] [place] [address]  PRINCIPAL DIAGNOSIS: Local recurrence of non-small cell lung cancer, adenocarcinoma, initially diagnosed as stage IB (T2a N0 M0) in March of 2010.   PRIOR THERAPY:  Status post right lower lobectomy under the care of Dr. Laneta Simmers on 04/08/2009. Status post palliative radiotherapy to the right lower lobe recurrent lung mass under the care of Dr. Mitzi Hansen, completed on June 08, 2011. Systemic chemotherapy with carboplatin for AUC of 5 and Alimta 500 mg/M2 every 3 weeks. She is status post 3 cycles. SBRT to the recurrent right lung tumor under the care of Dr. Kathrynn Running, last dose on 10/30/23.   CURRENT THERAPY: Tarceva 150 mg by mouth daily, therapy beginning 07/24/2012. Status post approximately 137 months of therapy.   INTERVAL HISTORY: [name] 63 y.o. female returns to the clinic today for follow-up visit accompanied by her daughter and interpreter.  Discussed the use of AI scribe software for clinical note transcription with the patient, who gave verbal consent to proceed.  History of Present Illness   [name] is a 63 year old female with recurrent non-small cell lung cancer who presents for follow-up.  She has a history of recurrent non-small cell lung cancer, adenocarcinoma, initially diagnosed as stage 1B in March 2010. She underwent a right lower lobectomy in May 2010. Since August 2013, she has been on treatment for approximately 137 months. In December 2024, a mass was identified in her right lung.  Recently, she experienced flu-like symptoms, including chest congestion, after her grandchildren were sick. She has difficulty sleeping and has been using Tylenol for symptom relief. No nausea, vomiting, or diarrhea.  She is currently on Tarceva with no changes in her medication regimen. She inquired about extending her  cough medicine prescription but was informed it is a narcotic and cannot be dispensed in larger quantities.       MEDICAL HISTORY: Past Medical History:  Diagnosis Date   Drug-induced skin rash 02/27/2017   Encounter for antineoplastic chemotherapy 01/19/2016   History of radiation therapy 05/02/11 to 06/08/11   right lung   Lung cancer (HCC)    right lower lobe adenocarcinoma    ALLERGIES:  is allergic to codeine and doxycycline.  MEDICATIONS:  Current Outpatient Medications  Medication Sig Dispense Refill   albuterol (PROAIR HFA) 108 (90 Base) MCG/ACT inhaler Inhale 1-2 puffs into the lungs every 6 (six) hours as needed for wheezing or shortness of breath. (Patient not taking: Reported on 12/14/2023) 6.7 g 2   chlorpheniramine-HYDROcodone (TUSSIONEX) 10-8 MG/5ML Take 5 mLs by mouth every 12 (twelve) hours as needed for cough. 115 mL 0   clindamycin (CLEOCIN T) 1 % lotion Apply topically 2 (two) times daily. (Patient not taking: Reported on 12/25/2023) 60 mL 0   diltiazem (CARDIZEM) 60 MG tablet Take 60 mg by mouth as needed (high heart rate).     diltiazem (TIADYLT ER) 120 MG 24 hr capsule Take 1 capsule by mouth daily. 90 capsule 3   doxycycline (VIBRA-TABS) 100 MG tablet Take 1 tablet (100 mg total) by mouth 2 (two) times daily. (Patient not taking: Reported on 12/14/2023) 20 tablet 0   erlotinib (TARCEVA) 150 MG tablet Take 1 tablet (150 mg total) by mouth daily. Take on an empty stomach at least 1 hour before or 2 hours after food. 30 tablet 6  ibuprofen (ADVIL) 600 MG tablet Take 1 tablet (600 mg total) by mouth every 6 (six) hours as needed for fever or mild pain (pain score 1-3). (Patient not taking: Reported on 12/14/2023) 30 tablet 0   prochlorperazine (COMPAZINE) 10 MG tablet Take 1 tablet (10 mg total) by mouth every 6 (six) hours as needed. (Patient not taking: Reported on 12/14/2023) 30 tablet 2   silver sulfADIAZINE (SILVADENE) 1 % cream Apply topically daily. Cleanse foot wound  with water, pat dry. Apply Silvadene daily. Top with dry dressing     No current facility-administered medications for this visit.    SURGICAL HISTORY:  Past Surgical History:  Procedure Laterality Date   BRONCHIAL BIOPSY  09/05/2023   Procedure: BRONCHIAL BIOPSIES;  Surgeon: Leslye Peer, MD;  Location: [facility] ENDOSCOPY;  Service: Pulmonary;;   BRONCHIAL BRUSHINGS  09/05/2023   Procedure: BRONCHIAL BRUSHINGS;  Surgeon: Leslye Peer, MD;  Location: [facility] ENDOSCOPY;  Service: Pulmonary;;   BRONCHIAL NEEDLE ASPIRATION BIOPSY  09/05/2023   Procedure: BRONCHIAL NEEDLE ASPIRATION BIOPSIES;  Surgeon: Leslye Peer, MD;  Location: [facility] ENDOSCOPY;  Service: Pulmonary;;   FIDUCIAL MARKER PLACEMENT  09/05/2023   Procedure: FIDUCIAL MARKER PLACEMENT;  Surgeon: Leslye Peer, MD;  Location: [facility] ENDOSCOPY;  Service: Pulmonary;;   LUNG LOBECTOMY  04/18/2009   RLL   PORTACATH PLACEMENT  02/29/2012   Procedure: INSERTION PORT-A-CATH;  Surgeon: Ines Bloomer, MD;  Location: [facility] OR;  Service: Thoracic;  Laterality: Left;  PowerPort 8 F attachable in left internal jugular.    REVIEW OF SYSTEMS:  Constitutional: negative Eyes: negative Ears, nose, mouth, throat, and face: negative Respiratory: positive for cough Cardiovascular: negative Gastrointestinal: positive for diarrhea Genitourinary:negative Integument/breast: positive for rash Hematologic/lymphatic: negative Musculoskeletal:negative Neurological: negative Behavioral/Psych: negative Endocrine: negative Allergic/Immunologic: negative   PHYSICAL EXAMINATION: General appearance: alert, cooperative, appears stated age, fatigued, and no distress Head: Normocephalic, without obvious abnormality, atraumatic Neck: no adenopathy, no JVD, supple, symmetrical, trachea midline, and thyroid not enlarged, symmetric, no tenderness/mass/nodules Lymph nodes: Cervical, supraclavicular, and axillary nodes normal. Resp: clear to auscultation bilaterally Back:  symmetric, no curvature. ROM normal. No CVA tenderness. Cardio: regular rate and rhythm, S1, S2 normal, no murmur, click, rub or gallop GI: soft, non-tender; bowel sounds normal; no masses,  no organomegaly Extremities: extremities normal, atraumatic, no cyanosis or edema Neurologic: Alert and oriented X 3, normal strength and tone. Normal symmetric reflexes. Normal coordination and gait  ECOG PERFORMANCE STATUS: 1 - Symptomatic but completely ambulatory  Blood pressure 133/78, pulse (!) 106, temperature 98.4 F (36.9 C), temperature source Temporal, resp. rate 18, weight 113 lb 3.2 oz (51.3 kg), last menstrual period 02/10/2012, SpO2 98%.  LABORATORY DATA: Lab Results  Component Value Date   WBC 11.8 (H) 01/01/2024   HGB 12.3 01/01/2024   HCT 37.2 01/01/2024   MCV 89.0 01/01/2024   PLT 329 01/01/2024      Chemistry      Component Value Date/Time   NA 142 01/01/2024 1156   NA 143 10/30/2017 1031   K 3.8 01/01/2024 1156   K 4.3 10/30/2017 1031   CL 104 01/01/2024 1156   CL 102 04/30/2013 1002   CO2 32 01/01/2024 1156   CO2 29 10/30/2017 1031   BUN 8 01/01/2024 1156   BUN 14.6 10/30/2017 1031   CREATININE 0.47 01/01/2024 1156   CREATININE 0.7 10/30/2017 1031      Component Value Date/Time   CALCIUM 9.2 01/01/2024 1156   CALCIUM 9.2 10/30/2017 1031  ALKPHOS 96 01/01/2024 1156   ALKPHOS 103 10/30/2017 1031   AST 17 01/01/2024 1156   AST 20 10/30/2017 1031   ALT 15 01/01/2024 1156   ALT 14 10/30/2017 1031   BILITOT 1.3 (H) 01/01/2024 1156   BILITOT 0.79 10/30/2017 1031       RADIOGRAPHIC STUDIES: CT Chest W Contrast Result Date: 01/09/2024 CLINICAL DATA:  Non-small-cell lung cancer. Metastatic. Radiation therapy completed in 2012. Prior right lower lobectomy. Chemotherapy completed in 2017. On immunotherapy. Weight loss. Chronic cough. * Tracking Code: BO * EXAM: CT CHEST WITH CONTRAST TECHNIQUE: Multidetector CT imaging of the chest was performed during intravenous  contrast administration. RADIATION DOSE REDUCTION: This exam was performed according to the departmental dose-optimization program which includes automated exposure control, adjustment of the mA and/or kV according to patient size and/or use of iterative reconstruction technique. CONTRAST:  60mL OMNIPAQUE IOHEXOL 300 MG/ML  SOLN COMPARISON:  11/13/2023 FINDINGS: Cardiovascular: Left Port-A-Cath tip mid right atrium. Aortic atherosclerosis. Tortuous thoracic aorta. Mild cardiomegaly, without pericardial effusion. No central pulmonary embolism, on this non-dedicated study. Lad coronary artery calcification. Mediastinum/Nodes: No supraclavicular adenopathy. No mediastinal or hilar adenopathy. Right cardiophrenic angle adenopathy at 9 mm on 88/2 versus 8 mm on the prior. Lungs/Pleura: Minimal right-sided pleural fluid with loculation anteriorly inferior inferiorly, similar. Right lower lobectomy. Again identified is consolidation within the posteroinferior right upper lobe. The mixed attenuation right perihilar component is estimated at 7.5 x 4.1 cm on 64/7 versus 7.6 x 4.5 cm when remeasured in a similar fashion on the prior exam. A lateral right upper lobe 4 mm ground-glass nodule on 61/7 is similar. The anteromedial right upper lobe mixed attenuation nodule measures 2.3 x 1.5 cm on 73/7 versus 2.1 x 1.1 cm on the prior. A subsolid right upper lobe 6 mm nodule on 67/7 is similar to the prior. Areas of right upper lobe probable atelectasis on 69/7 and 77/7 are new. Left lower lobe ground-glass opacity has resolved. Upper Abdomen: Hepatic steatosis. Normal imaged portions of the spleen, stomach, pancreas, adrenal glands, kidneys. Musculoskeletal: Remote left clavicular fracture. Irregularity of posterior upper right ribs (primarily the sixth and seventh) is similar and likely radiation induced. IMPRESSION: 1. Status post right lower lobectomy. Right upper lobe dependent consolidation and mixed attenuation are relatively  similar, again consistent with recurrence adenocarcinoma. 2. Separate right upper lobe subsolid nodules are similar and minimally increased as detailed above, favoring metastatic disease. 3. The left lower lobe ground-glass density has resolved. 4. Isolated right cardiophrenic angle adenopathy, minimally increased. Suspicious for metastatic disease. 5. Similar small right pleural effusion 6. Incidental findings, including: Aortic Atherosclerosis (ICD10-I70.0). Coronary artery atherosclerosis. Hepatic steatosis. Electronically Signed   By: Jeronimo Greaves M.D.   On: 01/09/2024 10:12     ASSESSMENT AND PLAN:  This is a very pleasant 63 years old Asian female with recurrent non-small cell lung cancer, adenocarcinoma status post surgical resection followed by systemic chemotherapy with carboplatin and Alimta followed by disease progression and the patient was started on treatment with Tarceva 150 mg by mouth daily status post 137 months  Her previous CT scan of the chest showed suspicious disease recurrence.  I ordered a PET scan which was performed recently and showed no evidence for metabolic activity associated with the right upper lobe pulmonary nodule or evidence for disease recurrence locally and there was some post radiation changes. The patient was referred to Dr. Delton Coombes and she underwent bronchoscopy that showed recurrent adenocarcinoma. She was treated with SBRT to the  right upper lobe lung mass under the care of Dr. Kathrynn Running. She has been tolerating her treatment with Tarceva fairly well. She had repeat CT scan of the chest performed recently.  I personally and independently reviewed the scan and discussed the result with the patient today.  Her scan showed no concerning findings for disease progression.    Recurrent Non-Small Cell Lung Cancer (NSCLC), Adenocarcinoma Diagnosed with recurrent NSCLC, adenocarcinoma, initially stage 1B in March 2010. Underwent right lower lobectomy in May 2010. On  treatment since August 2013, approximately 137 months. December 2024 scan revealed a mass in the right lung treated with SBRT. Latest scan shows no new growth or metastasis. Currently on Tarceva (erlotinib), tolerating well. Discussed continuing Tarceva due to stable disease and good tolerance. Regular scans are necessary to monitor for any changes. - Continue Tarceva (erlotinib) - Follow-up scan in two months - Schedule next oncology visit in two months  Upper Respiratory Infection (URI) Reports flu-like symptoms, including chest congestion, likely contracted from grandchildren. Symptoms present for a few days. Discussed symptomatic treatment and limitations on narcotic cough medicine due to potential for misuse. - Prescribe narcotic cough medicine to be picked up at [facility] - Advise Tylenol as needed, no more than 2-3 times a day for 4-5 days  General Health Maintenance Needs to maintain up-to-date immunizations, including pneumonia and flu shots. Managed by her family doctor. Discussed importance of regular immunizations and coordination with primary care. - Send a message to the family doctor to ensure pneumonia and flu shots are up-to-date - Advise follow-up with family doctor for routine immunizations.   The patient was advised to call immediately if she has any other concerning symptoms in the interval. All questions were answered. The patient knows to call the clinic with any problems, questions or concerns. We can certainly see the patient much sooner if necessary.  The total time spent in the appointment was 30 minutes.  Disclaimer: This note was dictated with voice recognition software. Similar sounding words can inadvertently be transcribed and may not be corrected upon review.

## 2024-01-29 ENCOUNTER — Other Ambulatory Visit: Payer: Self-pay | Admitting: Family Medicine

## 2024-01-29 MED ORDER — DILTIAZEM HCL ER BEADS 120 MG PO CP24: 120.0000 mg | ORAL_CAPSULE | Freq: Every day | ORAL | 3 refills | Status: DC

## 2024-02-06 ENCOUNTER — Other Ambulatory Visit: Payer: Self-pay | Admitting: Internal Medicine

## 2024-02-06 ENCOUNTER — Telehealth: Payer: Self-pay | Admitting: Medical Oncology

## 2024-02-06 DIAGNOSIS — R059 Cough, unspecified: Secondary | ICD-10-CM

## 2024-02-06 DIAGNOSIS — C3431 Malignant neoplasm of lower lobe, right bronchus or lung: Secondary | ICD-10-CM

## 2024-02-06 MED ORDER — HYDROCOD POLI-CHLORPHE POLI ER 10-8 MG/5ML PO SUER: 5.0000 mL | Freq: Two times a day (BID) | ORAL | 0 refills | Status: DC | PRN

## 2024-02-06 NOTE — Telephone Encounter (Signed)
 Son requested refill for Tussionex.

## 2024-03-01 ENCOUNTER — Emergency Department (HOSPITAL_COMMUNITY)

## 2024-03-01 ENCOUNTER — Other Ambulatory Visit: Payer: Self-pay

## 2024-03-01 ENCOUNTER — Telehealth: Payer: Self-pay

## 2024-03-01 ENCOUNTER — Emergency Department (HOSPITAL_COMMUNITY)
Admission: EM | Admit: 2024-03-01 | Discharge: 2024-03-01 | Disposition: A | Discharge status: Home / Self Care | Attending: Emergency Medicine | Admitting: Emergency Medicine

## 2024-03-01 DIAGNOSIS — R9431 Abnormal electrocardiogram [ECG] [EKG]: Secondary | ICD-10-CM | POA: Diagnosis not present

## 2024-03-01 DIAGNOSIS — N3090 Cystitis, unspecified without hematuria: Secondary | ICD-10-CM | POA: Insufficient documentation

## 2024-03-01 DIAGNOSIS — J168 Pneumonia due to other specified infectious organisms: Secondary | ICD-10-CM | POA: Diagnosis not present

## 2024-03-01 DIAGNOSIS — R042 Hemoptysis: Secondary | ICD-10-CM | POA: Insufficient documentation

## 2024-03-01 DIAGNOSIS — R918 Other nonspecific abnormal finding of lung field: Secondary | ICD-10-CM | POA: Diagnosis not present

## 2024-03-01 DIAGNOSIS — R300 Dysuria: Secondary | ICD-10-CM | POA: Diagnosis present

## 2024-03-01 DIAGNOSIS — Z85118 Personal history of other malignant neoplasm of bronchus and lung: Secondary | ICD-10-CM | POA: Diagnosis not present

## 2024-03-01 DIAGNOSIS — I700 Atherosclerosis of aorta: Secondary | ICD-10-CM | POA: Diagnosis not present

## 2024-03-01 LAB — CBC WITH DIFFERENTIAL/PLATELET
Abs Immature Granulocytes: 0.05 10*3/uL (ref 0.00–0.07)
Basophils Absolute: 0 10*3/uL (ref 0.0–0.1)
Basophils Relative: 0 %
Eosinophils Absolute: 0 10*3/uL (ref 0.0–0.5)
Eosinophils Relative: 0 %
HCT: 38.3 % (ref 36.0–46.0)
Hemoglobin: 12 g/dL (ref 12.0–15.0)
Immature Granulocytes: 1 %
Lymphocytes Relative: 10 %
Lymphs Abs: 1.1 10*3/uL (ref 0.7–4.0)
MCH: 29 pg (ref 26.0–34.0)
MCHC: 31.3 g/dL (ref 30.0–36.0)
MCV: 92.5 fL (ref 80.0–100.0)
Monocytes Absolute: 0.9 10*3/uL (ref 0.1–1.0)
Monocytes Relative: 9 %
Neutro Abs: 8.5 10*3/uL — ABNORMAL HIGH (ref 1.7–7.7)
Neutrophils Relative %: 80 %
Platelets: 237 10*3/uL (ref 150–400)
RBC: 4.14 MIL/uL (ref 3.87–5.11)
RDW: 14.2 % (ref 11.5–15.5)
WBC: 10.5 10*3/uL (ref 4.0–10.5)
nRBC: 0 % (ref 0.0–0.2)

## 2024-03-01 LAB — URINALYSIS, ROUTINE W REFLEX MICROSCOPIC
Bilirubin Urine: NEGATIVE
Glucose, UA: 150 mg/dL — AB
Ketones, ur: NEGATIVE mg/dL
Nitrite: NEGATIVE
Protein, ur: NEGATIVE mg/dL
Specific Gravity, Urine: 1.004 — ABNORMAL LOW (ref 1.005–1.030)
WBC, UA: >50 WBC/hpf (ref 0–5)
pH: 6 (ref 5.0–8.0)

## 2024-03-01 LAB — COMPREHENSIVE METABOLIC PANEL WITH GFR
ALT: 17 U/L (ref 0–44)
AST: 29 U/L (ref 15–41)
Albumin: 3.7 g/dL (ref 3.5–5.0)
Alkaline Phosphatase: 92 U/L (ref 38–126)
Anion gap: 11 (ref 5–15)
BUN: 5 mg/dL — ABNORMAL LOW (ref 8–23)
CO2: 23 mmol/L (ref 22–32)
Calcium: 8.7 mg/dL — ABNORMAL LOW (ref 8.9–10.3)
Chloride: 104 mmol/L (ref 98–111)
Creatinine, Ser: 0.59 mg/dL (ref 0.44–1.00)
GFR, Estimated: >60 mL/min (ref >60)
Glucose, Bld: 199 mg/dL — ABNORMAL HIGH (ref 70–99)
Potassium: 4.4 mmol/L (ref 3.5–5.1)
Sodium: 138 mmol/L (ref 135–145)
Total Bilirubin: 1.2 mg/dL (ref 0.0–1.2)
Total Protein: 7.6 g/dL (ref 6.5–8.1)

## 2024-03-01 LAB — TROPONIN I (HIGH SENSITIVITY)
Troponin I (High Sensitivity): 5 ng/L (ref <18)
Troponin I (High Sensitivity): 7 ng/L (ref <18)

## 2024-03-01 MED ORDER — SODIUM CHLORIDE (PF) 0.9 % IJ SOLN
INTRAMUSCULAR | Status: AC
  Filled 2024-03-01: qty 50

## 2024-03-01 MED ORDER — ACETAMINOPHEN 500 MG PO TABS
1000.0000 mg | ORAL_TABLET | Freq: Once | ORAL | Status: AC
  Administered 2024-03-01: 1000 mg via ORAL
  Filled 2024-03-01: qty 2

## 2024-03-01 MED ORDER — IOHEXOL 350 MG/ML SOLN
75.0000 mL | Freq: Once | INTRAVENOUS | Status: AC | PRN
  Administered 2024-03-01: 75 mL via INTRAVENOUS

## 2024-03-01 MED ORDER — SODIUM CHLORIDE 0.9 % IV SOLN
1.0000 g | INTRAVENOUS | Status: DC
  Administered 2024-03-01: 1 g via INTRAVENOUS
  Filled 2024-03-01 (×2): qty 10

## 2024-03-01 MED ORDER — AMOXICILLIN-POT CLAVULANATE 875-125 MG PO TABS: 1.0000 | ORAL_TABLET | Freq: Two times a day (BID) | ORAL | 0 refills | Status: DC

## 2024-03-01 NOTE — ED Notes (Signed)
Pt's urine sent to lab

## 2024-03-01 NOTE — Discharge Instructions (Addendum)
 Your CT findings show possible worsening mass, versus pneumonia.  I have prescribed you some antibiotics to treat it in case it is pneumonia, however I will need you to follow-up with your oncologist, Dr. Shirline Frees.  I have messaged him as well, to let him know of your abnormal CT findings.  You also have a urinary tract infection.  The antibiotics I sent you should treat both the UTI and the pneumonia.  Also your EKG was somewhat abnormal today, please follow-up with your primary care doctor and a cardiologist for further evaluation

## 2024-03-01 NOTE — ED Provider Notes (Signed)
 [place] EMERGENCY DEPARTMENT AT [facility] Provider Note   CSN: [id number] Arrival date & time: 03/01/24  1237     History  Chief Complaint  Patient presents with   Dysuria   Abdominal Pain   Urinary Frequency    [name] is a 63 y.o. female, history of lung cancer, with right lower lobe lobectomy, who presents to the ED secondary to 2 different complaints.  She states for the last day, she has been coughing up bright red blood, denies any worsening shortness of breath, or chest pain.  Son says that she has not been having any upper respiratory infectious symptoms.  He was told to come here by the cancer center.  Is currently on a chemo pill  Is also here for difficulty urinating, states that she has had 2 squeezed out her urine recently, and that it hurts when she pees.  She denies any vaginal discharge, or bowel complaints.  Has not had any fevers or chills.  I offered an interpreter, and the family member, and the patient declined  Home Medications Prior to Admission medications   Medication Sig Start Date End Date Taking? Authorizing Provider  amoxicillin-clavulanate (AUGMENTIN) 875-125 MG tablet Take 1 tablet by mouth every 12 (twelve) hours. 03/01/24  Yes Breannah Kratt L, PA  albuterol (PROAIR HFA) 108 (90 Base) MCG/ACT inhaler Inhale 1-2 puffs into the lungs every 6 (six) hours as needed for wheezing or shortness of breath. Patient not taking: Reported on 12/14/2023 06/19/23   [name], MD  chlorpheniramine-HYDROcodone (TUSSIONEX) 10-8 MG/5ML Take 5 mLs by mouth every 12 (twelve) hours as needed for cough. 02/06/24   Si Gaul, MD  clindamycin (CLEOCIN T) 1 % lotion Apply topically 2 (two) times daily. Patient not taking: Reported on 12/25/2023 11/20/23   Heilingoetter, Cassandra L, PA-C  diltiazem (CARDIZEM) 60 MG tablet Take 60 mg by mouth as needed (high heart rate).    [provider]  diltiazem (TIADYLT ER) 120 MG 24 hr capsule Take 1  capsule by mouth daily. 01/29/24   Sherald Barge, FNP  doxycycline (VIBRA-TABS) 100 MG tablet Take 1 tablet (100 mg total) by mouth 2 (two) times daily. Patient not taking: Reported on 12/14/2023 11/20/23   Heilingoetter, Cassandra L, PA-C  erlotinib (TARCEVA) 150 MG tablet Take 1 tablet (150 mg total) by mouth daily. Take on an empty stomach at least 1 hour before or 2 hours after food. 10/04/23   Si Gaul, MD  ibuprofen (ADVIL) 600 MG tablet Take 1 tablet (600 mg total) by mouth every 6 (six) hours as needed for fever or mild pain (pain score 1-3). Patient not taking: Reported on 12/14/2023 10/21/23   Nira Conn, MD  prochlorperazine (COMPAZINE) 10 MG tablet Take 1 tablet (10 mg total) by mouth every 6 (six) hours as needed. Patient not taking: Reported on 12/14/2023 11/20/23   Heilingoetter, Cassandra L, PA-C  silver sulfADIAZINE (SILVADENE) 1 % cream Apply topically daily. Cleanse foot wound with water, pat dry. Apply Silvadene daily. Top with dry dressing 10/23/23   [name], MD      Allergies    Codeine and Doxycycline    Review of Systems   Review of Systems  Constitutional:  Negative for fever.  Gastrointestinal:  Positive for abdominal pain.  Genitourinary:  Positive for dysuria and frequency.    Physical Exam Updated Vital Signs BP 127/70 (BP Location: Left Arm)   Pulse (!) 51   Temp 98.8 F (37.1 C) (  Oral)   Resp 16   Ht 4\' 10"  (1.473 m)   Wt 51.3 kg   LMP 02/10/2012   SpO2 95%   BMI 23.64 kg/m  Physical Exam Vitals and nursing note reviewed.  Constitutional:      General: She is not in acute distress.    Appearance: She is well-developed.  HENT:     Head: Normocephalic and atraumatic.  Eyes:     Conjunctiva/sclera: Conjunctivae normal.  Cardiovascular:     Rate and Rhythm: Normal rate and regular rhythm.     Heart sounds: No murmur heard. Pulmonary:     Effort: Pulmonary effort is normal. No respiratory distress.     Breath  sounds: Normal breath sounds.  Abdominal:     Palpations: Abdomen is soft.     Tenderness: There is no abdominal tenderness.  Musculoskeletal:        General: No swelling.     Cervical back: Neck supple.  Skin:    General: Skin is warm and dry.     Capillary Refill: Capillary refill takes less than 2 seconds.  Neurological:     Mental Status: She is alert.  Psychiatric:        Mood and Affect: Mood normal.     ED Results / Procedures / Treatments   Labs (all labs ordered are listed, but only abnormal results are displayed) Labs Reviewed  URINALYSIS, ROUTINE W REFLEX MICROSCOPIC - Abnormal; Notable for the following components:      Result Value   Color, Urine STRAW (*)    Specific Gravity, Urine 1.004 (*)    Glucose, UA 150 (*)    Hgb urine dipstick LARGE (*)    Leukocytes,Ua LARGE (*)    Bacteria, UA RARE (*)    All other components within normal limits  CBC WITH DIFFERENTIAL/PLATELET - Abnormal; Notable for the following components:   Neutro Abs 8.5 (*)    All other components within normal limits  COMPREHENSIVE METABOLIC PANEL WITH GFR - Abnormal; Notable for the following components:   Glucose, Bld 199 (*)    BUN 5 (*)    Calcium 8.7 (*)    All other components within normal limits  TROPONIN I (HIGH SENSITIVITY)  TROPONIN I (HIGH SENSITIVITY)    EKG EKG Interpretation Date/Time:  Friday March 01 2024 12:44:21 EDT Ventricular Rate:  53 PR Interval:    QRS Duration:  100 QT Interval:  551 QTC Calculation: 518 R Axis:   72  Text Interpretation: Junctional rhythm with occasional sinus beats Minimal ST depression, lateral leads Prolonged QT interval Confirmed by Elayne Snare ([id number]) on 03/01/2024 1:23:19 PM  Radiology CT Angio Chest Pulmonary Embolism (PE) W or WO Contrast Result Date: 03/01/2024 CLINICAL DATA:  Pulmonary embolism (PE) suspected, high prob lung cancer pt, hemoptysis. * Tracking Code: BO * EXAM: CT ANGIOGRAPHY CHEST WITH CONTRAST TECHNIQUE:  Multidetector CT imaging of the chest was performed using the standard protocol during bolus administration of intravenous contrast. Multiplanar CT image reconstructions and MIPs were obtained to evaluate the vascular anatomy. RADIATION DOSE REDUCTION: This exam was performed according to the departmental dose-optimization program which includes automated exposure control, adjustment of the mA and/or kV according to patient size and/or use of iterative reconstruction technique. CONTRAST:  75mL OMNIPAQUE IOHEXOL 350 MG/ML SOLN COMPARISON:  CT scan chest from 01/01/2024. FINDINGS: Cardiovascular: No evidence of embolism to the proximal subsegmental pulmonary artery level. There is dilation of the main pulmonary trunk measuring up to 3.1 cm, which is  nonspecific but can be seen with pulmonary artery hypertension. Normal cardiac size. No pericardial effusion. No aortic aneurysm. There are minimal peripheral atherosclerotic vascular calcifications of thoracic aorta and its major branches. Mediastinum/Nodes: Visualized thyroid gland appears grossly unremarkable. No solid / cystic mediastinal masses. The esophagus is nondistended precluding optimal assessment. No axillary, mediastinal or hilar lymphadenopathy by size criteria. Lungs/Pleura: Postsurgical changes from prior right lung lower lobectomy. Central tracheobronchial tree is otherwise patent. Redemonstration of predominantly solid opacity in the right upper lobe, posterior inferior aspect, which appears grossly similar to prior study. However, there is enlarging solid/ground-glass opacity in the anterior inferior aspect. There is a fiducial marker centered in this enlarging opacity. There is Jairon Ripberger loculated right pleural effusion, increased since the prior study. Left lung is essentially clear. No pleural effusion or pneumothorax, lung mass or consolidation. No suspicious lung nodule. Upper Abdomen: Visualized upper abdominal viscera within normal limits.  Musculoskeletal: A CT Port-a-Cath is seen in the left upper chest wall with the catheter terminating in the cavo-atrial junction region. Visualized soft tissues of the chest wall are otherwise grossly unremarkable. No suspicious osseous lesions. There are mild multilevel degenerative changes in the visualized spine. Redemonstration of probable postsurgical changes in the right posteromedial sixth and seventh ribs, similar to the prior study. Review of the MIP images confirms the above findings. IMPRESSION: 1. No embolism to the proximal subsegmental pulmonary artery level. 2. Redemonstration of postsurgical changes from prior right lung lower lobectomy. 3. Previously seen predominantly solid opacity in the right lung, postero-inferiorly is unchanged. However, there is enlarging solid/ground-glass opacity in the right lung, antero-inferiorly, which is indeterminate and differential diagnosis includes superimposed infection versus local recurrent tumor. 4. Multiple other nonacute observations, as described above. Aortic Atherosclerosis (ICD10-I70.0). Electronically Signed   By: Jules Schick M.D.   On: 03/01/2024 15:38    Procedures Procedures    Medications Ordered in ED Medications  cefTRIAXone (ROCEPHIN) 1 g in sodium chloride 0.9 % 100 mL IVPB (1 g Intravenous New Bag/Given 03/01/24 1416)  iohexol (OMNIPAQUE) 350 MG/ML injection 75 mL (75 mLs Intravenous Contrast Given 03/01/24 1451)    ED Course/ Medical Decision Making/ A&P                                 Medical Decision Making Patient is a 63 year old female, here for hemoptysis, as well as urinary complaints.  Hemoptysis x 1 day, new or worsening shortness of breath, or chest pain.  She does have a history of lung cancer and is currently going through treatment, we will obtain a CTA PE, to evaluate for possible pneumonia, pulmonary emboli, versus worsening mass, given high risk.  Will also obtain urinalysis, to evaluate for possible urinary  tract infection  Amount and/or Complexity of Data Reviewed Labs: ordered.    Details: Many leukocytes, and white blood cells, in the urine with trace bacteria, blood work is fairly unremarkable Radiology: ordered.    Details: CTA PE study, shows no PE, but right lower groundglass opacity.  Possible superimposed infection?,  Versus recurrent lung cancer ECG/medicine tests:  Decision-making details documented in ED Course. Discussion of management or test interpretation with external provider(s): EKG to be abnormal by Dr. Theresia Lo, put cardiology referral in, for follow-up, as well as discussed CTA findings with patient, we discussed this may be secondary to cancer, versus infection.  Will go ahead and treat her for urinary tract infection as well as pneumonia  with Augmentin, and have her follow-up with her primary care doctor, and Dr. Shirline Frees.  Attempted to message Dr. Shirline Frees, about the results, but he was offline, so I messaged the on-call doctor Dr. Mosetta Putt, who states she will relay the message to him.  We discussed return precautions, patient voiced understanding.  Is not hypoxic and overall well-appearing on my exam.  Risk Prescription drug management.    Final Clinical Impression(s) / ED Diagnoses Final diagnoses:  Cystitis  Ground glass opacity present on imaging of lung  Hemoptysis    Rx / DC Orders ED Discharge Orders          Ordered    amoxicillin-clavulanate (AUGMENTIN) 875-125 MG tablet  Every 12 hours        03/01/24 1556              Loretta Doutt L, PA 03/01/24 1606    Elayne Snare K, DO 03/01/24 1624

## 2024-03-01 NOTE — Telephone Encounter (Signed)
 Patients son left a voicemail stating that his mom was coughing up blood.  Spoke with patients son, Ra.  He reports he saw his mom cough up blood this morning and she does not know how long she has been coughing up blood.  Informed Ra the patient needs to be evaluated in the ER.  Ra verbalized understanding.

## 2024-03-01 NOTE — ED Triage Notes (Signed)
 Pt arrived reporting dysuria and frequency for 3 days. Hx of lung ca. Intermittent lower abdominal pain. No other concerns

## 2024-03-05 ENCOUNTER — Telehealth: Payer: Self-pay

## 2024-03-05 NOTE — Telephone Encounter (Signed)
 Patients son called and LVM stating that patient was evaluated in the ER on Friday.  The son is requesting patient to be seen sooner to discuss patients condition.  Schedule message sent.

## 2024-03-06 ENCOUNTER — Telehealth: Payer: Self-pay | Admitting: Internal Medicine

## 2024-03-06 NOTE — Telephone Encounter (Signed)
 Patient's son called in to scheduled a hospital follow up appointment. [name] is scheduled and aware of her appointments.

## 2024-03-11 ENCOUNTER — Inpatient Hospital Stay: Attending: Internal Medicine | Admitting: Internal Medicine

## 2024-03-11 ENCOUNTER — Inpatient Hospital Stay

## 2024-03-11 VITALS — BP 145/72 | HR 57 | Temp 97.6°F | Resp 15 | Ht <= 58 in | Wt 112.4 lb

## 2024-03-11 DIAGNOSIS — Z79899 Other long term (current) drug therapy: Secondary | ICD-10-CM | POA: Diagnosis not present

## 2024-03-11 DIAGNOSIS — C3431 Malignant neoplasm of lower lobe, right bronchus or lung: Secondary | ICD-10-CM | POA: Diagnosis not present

## 2024-03-11 DIAGNOSIS — R059 Cough, unspecified: Secondary | ICD-10-CM | POA: Insufficient documentation

## 2024-03-11 DIAGNOSIS — C3411 Malignant neoplasm of upper lobe, right bronchus or lung: Secondary | ICD-10-CM | POA: Diagnosis not present

## 2024-03-11 DIAGNOSIS — Z95828 Presence of other vascular implants and grafts: Secondary | ICD-10-CM

## 2024-03-11 LAB — CMP (CANCER CENTER ONLY)
ALT: 12 U/L (ref 0–44)
AST: 20 U/L (ref 15–41)
Albumin: 4.2 g/dL (ref 3.5–5.0)
Alkaline Phosphatase: 85 U/L (ref 38–126)
Anion gap: 4 — ABNORMAL LOW (ref 5–15)
BUN: 8 mg/dL (ref 8–23)
CO2: 31 mmol/L (ref 22–32)
Calcium: 8.7 mg/dL — ABNORMAL LOW (ref 8.9–10.3)
Chloride: 106 mmol/L (ref 98–111)
Creatinine: 0.49 mg/dL (ref 0.44–1.00)
GFR, Estimated: >60 mL/min (ref >60)
Glucose, Bld: 89 mg/dL (ref 70–99)
Potassium: 3.5 mmol/L (ref 3.5–5.1)
Sodium: 141 mmol/L (ref 135–145)
Total Bilirubin: 1.2 mg/dL (ref 0.0–1.2)
Total Protein: 7.5 g/dL (ref 6.5–8.1)

## 2024-03-11 LAB — CBC WITH DIFFERENTIAL (CANCER CENTER ONLY)
Abs Immature Granulocytes: 0.02 10*3/uL (ref 0.00–0.07)
Basophils Absolute: 0 10*3/uL (ref 0.0–0.1)
Basophils Relative: 0 %
Eosinophils Absolute: 0.1 10*3/uL (ref 0.0–0.5)
Eosinophils Relative: 1 %
HCT: 38.9 % (ref 36.0–46.0)
Hemoglobin: 12.6 g/dL (ref 12.0–15.0)
Immature Granulocytes: 0 %
Lymphocytes Relative: 16 %
Lymphs Abs: 1.4 10*3/uL (ref 0.7–4.0)
MCH: 28.2 pg (ref 26.0–34.0)
MCHC: 32.4 g/dL (ref 30.0–36.0)
MCV: 87 fL (ref 80.0–100.0)
Monocytes Absolute: 0.6 10*3/uL (ref 0.1–1.0)
Monocytes Relative: 6 %
Neutro Abs: 6.9 10*3/uL (ref 1.7–7.7)
Neutrophils Relative %: 77 %
Platelet Count: 339 10*3/uL (ref 150–400)
RBC: 4.47 MIL/uL (ref 3.87–5.11)
RDW: 13.5 % (ref 11.5–15.5)
WBC Count: 9 10*3/uL (ref 4.0–10.5)
nRBC: 0 % (ref 0.0–0.2)

## 2024-03-11 MED ORDER — SODIUM CHLORIDE 0.9% FLUSH
10.0000 mL | INTRAVENOUS | Status: DC | PRN
  Administered 2024-03-11: 10 mL via INTRAVENOUS

## 2024-03-11 MED ORDER — HEPARIN SOD (PORK) LOCK FLUSH 100 UNIT/ML IV SOLN
500.0000 [IU] | Freq: Once | INTRAVENOUS | Status: AC | PRN
  Administered 2024-03-11: 500 [IU] via INTRAVENOUS

## 2024-03-11 MED ORDER — CLINDAMYCIN PHOSPHATE 1 % EX LOTN: TOPICAL_LOTION | Freq: Two times a day (BID) | CUTANEOUS | 0 refills | Status: DC

## 2024-03-11 MED ORDER — HYDROCOD POLI-CHLORPHE POLI ER 10-8 MG/5ML PO SUER
5.0000 mL | Freq: Two times a day (BID) | ORAL | 0 refills | Status: DC | PRN
Start: 2024-03-11 — End: 2024-05-24

## 2024-03-11 NOTE — Progress Notes (Signed)
 #        [facility] Telephone:([phone]) [phone]   [phone]) [phone]  OFFICE PROGRESS NOTE  Kathryn Hammersmith, MD [address] [place] [address]  PRINCIPAL DIAGNOSIS: Local recurrence of non-small cell lung cancer, adenocarcinoma, initially diagnosed as stage IB (T2a N0 M0) in March of 2010.   PRIOR THERAPY:  Status post right lower lobectomy under the care of Dr. Sherene Dilling on 04/08/2009. Status post palliative radiotherapy to the right lower lobe recurrent lung mass under the care of Dr. Jeryl Moris, completed on June 08, 2011. Systemic chemotherapy with carboplatin for AUC of 5 and Alimta 500 mg/M2 every 3 weeks. She is status post 3 cycles. SBRT to the recurrent right lung tumor under the care of Dr. Lorri Rota, last dose on 10/30/23.   CURRENT THERAPY: Tarceva 150 mg by mouth daily, therapy beginning 07/24/2012. Status post approximately 139 months of therapy.   INTERVAL HISTORY: [name] 63 y.o. female returns to the clinic today for follow-up visit accompanied by her video interpreter.  Discussed the use of AI scribe software for clinical note transcription with the patient, who gave verbal consent to proceed.  History of Present Illness   [name] is a 63 year old female with recurrent non-small cell lung cancer who presents with cough and blood-tinged sputum.  She has a history of recurrent non-small cell lung cancer, adenocarcinoma, initially diagnosed as stage 1B in March 2010. She underwent a right lower lobectomy, followed by palliative radiation and systemic chemotherapy with carboplatin and Alimta for three cycles. Since July 24, 2012, she has been on Tarceva 150 mg daily, which she has tolerated well.  Approximately ten days ago, she visited the emergency department due to coughing up blood. A CT angiogram of the chest was performed, which showed no evidence of cancer progression but did reveal some lung inflammation. She was prescribed a ten-day course of  Augmentin, which she is about to complete. She reports an increase in cough frequency and difficulty obtaining her cough medication, Desenex, in a timely manner, which has led to periods without medication.  No new symptoms such as diarrhea or rash. She plans to travel to [place] for four weeks starting March 26, 2024, and is concerned about having enough medication, including Tarceva and her cough medication, for the duration of her trip.       MEDICAL HISTORY: Past Medical History:  Diagnosis Date   Drug-induced skin rash 02/27/2017   Encounter for antineoplastic chemotherapy 01/19/2016   History of radiation therapy 05/02/11 to 06/08/11   right lung   Lung cancer (HCC)    right lower lobe adenocarcinoma    ALLERGIES:  is allergic to codeine and doxycycline.  MEDICATIONS:  Current Outpatient Medications  Medication Sig Dispense Refill   albuterol (PROAIR HFA) 108 (90 Base) MCG/ACT inhaler Inhale 1-2 puffs into the lungs every 6 (six) hours as needed for wheezing or shortness of breath. (Patient not taking: Reported on 12/14/2023) 6.7 g 2   amoxicillin-clavulanate (AUGMENTIN) 875-125 MG tablet Take 1 tablet by mouth every 12 (twelve) hours. 14 tablet 0   chlorpheniramine-HYDROcodone (TUSSIONEX) 10-8 MG/5ML Take 5 mLs by mouth every 12 (twelve) hours as needed for cough. 115 mL 0   clindamycin (CLEOCIN T) 1 % lotion Apply topically 2 (two) times daily. (Patient not taking: Reported on 12/25/2023) 60 mL 0   diltiazem (CARDIZEM) 60 MG tablet Take 60 mg by mouth as needed (high heart rate).     diltiazem (TIADYLT ER)  120 MG 24 hr capsule Take 1 capsule by mouth daily. 90 capsule 3   doxycycline (VIBRA-TABS) 100 MG tablet Take 1 tablet (100 mg total) by mouth 2 (two) times daily. (Patient not taking: Reported on 12/14/2023) 20 tablet 0   erlotinib (TARCEVA) 150 MG tablet Take 1 tablet (150 mg total) by mouth daily. Take on an empty stomach at least 1 hour before or 2 hours after food. 30 tablet 6    ibuprofen (ADVIL) 600 MG tablet Take 1 tablet (600 mg total) by mouth every 6 (six) hours as needed for fever or mild pain (pain score 1-3). (Patient not taking: Reported on 12/14/2023) 30 tablet 0   prochlorperazine (COMPAZINE) 10 MG tablet Take 1 tablet (10 mg total) by mouth every 6 (six) hours as needed. (Patient not taking: Reported on 12/14/2023) 30 tablet 2   silver sulfADIAZINE (SILVADENE) 1 % cream Apply topically daily. Cleanse foot wound with water, pat dry. Apply Silvadene daily. Top with dry dressing     No current facility-administered medications for this visit.   Facility-Administered Medications Ordered in Other Visits  Medication Dose Route Frequency Provider Last Rate Last Admin   sodium chloride flush (NS) 0.9 % injection 10 mL  10 mL Intravenous PRN Marlene Simas, MD   10 mL at 03/11/24 1107    SURGICAL HISTORY:  Past Surgical History:  Procedure Laterality Date   BRONCHIAL BIOPSY  09/05/2023   Procedure: BRONCHIAL BIOPSIES;  Surgeon: Denson Flake, MD;  Location: [facility] ENDOSCOPY;  Service: Pulmonary;;   BRONCHIAL BRUSHINGS  09/05/2023   Procedure: BRONCHIAL BRUSHINGS;  Surgeon: Denson Flake, MD;  Location: [facility] ENDOSCOPY;  Service: Pulmonary;;   BRONCHIAL NEEDLE ASPIRATION BIOPSY  09/05/2023   Procedure: BRONCHIAL NEEDLE ASPIRATION BIOPSIES;  Surgeon: Denson Flake, MD;  Location: [facility] ENDOSCOPY;  Service: Pulmonary;;   FIDUCIAL MARKER PLACEMENT  09/05/2023   Procedure: FIDUCIAL MARKER PLACEMENT;  Surgeon: Denson Flake, MD;  Location: [facility] ENDOSCOPY;  Service: Pulmonary;;   LUNG LOBECTOMY  04/18/2009   RLL   PORTACATH PLACEMENT  02/29/2012   Procedure: INSERTION PORT-A-CATH;  Surgeon: Gwendel Lemme, MD;  Location: [facility] OR;  Service: Thoracic;  Laterality: Left;  PowerPort 8 F attachable in left internal jugular.    REVIEW OF SYSTEMS:  Constitutional: negative Eyes: negative Ears, nose, mouth, throat, and face: negative Respiratory: positive for cough Cardiovascular:  negative Gastrointestinal: negative Genitourinary:negative Integument/breast: negative Hematologic/lymphatic: negative Musculoskeletal:negative Neurological: negative Behavioral/Psych: negative Endocrine: negative Allergic/Immunologic: negative   PHYSICAL EXAMINATION: General appearance: alert, cooperative, appears stated age, fatigued, and no distress Head: Normocephalic, without obvious abnormality, atraumatic Neck: no adenopathy, no JVD, supple, symmetrical, trachea midline, and thyroid not enlarged, symmetric, no tenderness/mass/nodules Lymph nodes: Cervical, supraclavicular, and axillary nodes normal. Resp: clear to auscultation bilaterally Back: symmetric, no curvature. ROM normal. No CVA tenderness. Cardio: regular rate and rhythm, S1, S2 normal, no murmur, click, rub or gallop GI: soft, non-tender; bowel sounds normal; no masses,  no organomegaly Extremities: extremities normal, atraumatic, no cyanosis or edema Neurologic: Alert and oriented X 3, normal strength and tone. Normal symmetric reflexes. Normal coordination and gait  ECOG PERFORMANCE STATUS: 1 - Symptomatic but completely ambulatory  Blood pressure (!) 145/72, pulse (!) 57, temperature 97.6 F (36.4 C), temperature source Temporal, resp. rate 15, height 4\' 10"  (1.473 m), weight 112 lb 6.4 oz (51 kg), last menstrual period 02/10/2012, SpO2 98%.  LABORATORY DATA: Lab Results  Component Value Date   WBC 10.5 03/01/2024   HGB 12.0  03/01/2024   HCT 38.3 03/01/2024   MCV 92.5 03/01/2024   PLT 237 03/01/2024      Chemistry      Component Value Date/Time   NA 138 03/01/2024 1333   NA 143 10/30/2017 1031   K 4.4 03/01/2024 1333   K 4.3 10/30/2017 1031   CL 104 03/01/2024 1333   CL 102 04/30/2013 1002   CO2 23 03/01/2024 1333   CO2 29 10/30/2017 1031   BUN 5 (L) 03/01/2024 1333   BUN 14.6 10/30/2017 1031   CREATININE 0.59 03/01/2024 1333   CREATININE 0.47 01/01/2024 1156   CREATININE 0.7 10/30/2017 1031       Component Value Date/Time   CALCIUM 8.7 (L) 03/01/2024 1333   CALCIUM 9.2 10/30/2017 1031   ALKPHOS 92 03/01/2024 1333   ALKPHOS 103 10/30/2017 1031   AST 29 03/01/2024 1333   AST 17 01/01/2024 1156   AST 20 10/30/2017 1031   ALT 17 03/01/2024 1333   ALT 15 01/01/2024 1156   ALT 14 10/30/2017 1031   BILITOT 1.2 03/01/2024 1333   BILITOT 1.3 (H) 01/01/2024 1156   BILITOT 0.79 10/30/2017 1031       RADIOGRAPHIC STUDIES: CT Angio Chest Pulmonary Embolism (PE) W or WO Contrast Result Date: 03/01/2024 CLINICAL DATA:  Pulmonary embolism (PE) suspected, high prob lung cancer pt, hemoptysis. * Tracking Code: BO * EXAM: CT ANGIOGRAPHY CHEST WITH CONTRAST TECHNIQUE: Multidetector CT imaging of the chest was performed using the standard protocol during bolus administration of intravenous contrast. Multiplanar CT image reconstructions and MIPs were obtained to evaluate the vascular anatomy. RADIATION DOSE REDUCTION: This exam was performed according to the departmental dose-optimization program which includes automated exposure control, adjustment of the mA and/or kV according to patient size and/or use of iterative reconstruction technique. CONTRAST:  75mL OMNIPAQUE IOHEXOL 350 MG/ML SOLN COMPARISON:  CT scan chest from 01/01/2024. FINDINGS: Cardiovascular: No evidence of embolism to the proximal subsegmental pulmonary artery level. There is dilation of the main pulmonary trunk measuring up to 3.1 cm, which is nonspecific but can be seen with pulmonary artery hypertension. Normal cardiac size. No pericardial effusion. No aortic aneurysm. There are minimal peripheral atherosclerotic vascular calcifications of thoracic aorta and its major branches. Mediastinum/Nodes: Visualized thyroid gland appears grossly unremarkable. No solid / cystic mediastinal masses. The esophagus is nondistended precluding optimal assessment. No axillary, mediastinal or hilar lymphadenopathy by size criteria. Lungs/Pleura:  Postsurgical changes from prior right lung lower lobectomy. Central tracheobronchial tree is otherwise patent. Redemonstration of predominantly solid opacity in the right upper lobe, posterior inferior aspect, which appears grossly similar to prior study. However, there is enlarging solid/ground-glass opacity in the anterior inferior aspect. There is a fiducial marker centered in this enlarging opacity. There is small loculated right pleural effusion, increased since the prior study. Left lung is essentially clear. No pleural effusion or pneumothorax, lung mass or consolidation. No suspicious lung nodule. Upper Abdomen: Visualized upper abdominal viscera within normal limits. Musculoskeletal: A CT Port-a-Cath is seen in the left upper chest wall with the catheter terminating in the cavo-atrial junction region. Visualized soft tissues of the chest wall are otherwise grossly unremarkable. No suspicious osseous lesions. There are mild multilevel degenerative changes in the visualized spine. Redemonstration of probable postsurgical changes in the right posteromedial sixth and seventh ribs, similar to the prior study. Review of the MIP images confirms the above findings. IMPRESSION: 1. No embolism to the proximal subsegmental pulmonary artery level. 2. Redemonstration of postsurgical changes from prior  right lung lower lobectomy. 3. Previously seen predominantly solid opacity in the right lung, postero-inferiorly is unchanged. However, there is enlarging solid/ground-glass opacity in the right lung, antero-inferiorly, which is indeterminate and differential diagnosis includes superimposed infection versus local recurrent tumor. 4. Multiple other nonacute observations, as described above. Aortic Atherosclerosis (ICD10-I70.0). Electronically Signed   By: Beula Brunswick M.D.   On: 03/01/2024 15:38     ASSESSMENT AND PLAN:  This is a very pleasant 63 years old Asian female with recurrent non-small cell lung cancer,  adenocarcinoma status post surgical resection followed by systemic chemotherapy with carboplatin and Alimta followed by disease progression and the patient was started on treatment with Tarceva 150 mg by mouth daily status post 139 months  The patient was referred to Dr. Baldwin Levee and she underwent bronchoscopy that showed recurrent adenocarcinoma. She was treated with SBRT to the right upper lobe lung mass under the care of Dr. Lorri Rota. The patient has been tolerating her treatment with Tarceva fairly well.    Recurrent non-small cell lung cancer, adenocarcinoma 64 year old Asian female with recurrent non-small cell lung cancer, adenocarcinoma, initially diagnosed as stage 1B in March 2010. She underwent right lower lobectomy, palliative radiation, and systemic chemotherapy with carboplatin and Alimta. She has been on Tarceva 150 mg daily since July 24, 2012, with no evidence of cancer progression on recent CT angiogram, though lung inflammation is present. She is well-managed on Tarceva for nearly 15 years, which is remarkable for her condition. - Continue Tarceva 150 mg daily - Schedule follow-up in one month with repeat blood work  Hemoptysis Presented with cough and blood-tinged sputum. CT angiogram showed no cancer progression but revealed lung inflammation. Treated with a 10-day course of Augmentin, nearing completion. - Ensure completion of Augmentin course - Monitor for recurrence of hemoptysis  Cough Increased cough frequency and difficulty obtaining Desenex in a timely manner. Needs timely refills to avoid running out. - Prescribe Desenex for cough - Advise to request refills a few days before running out  Travel to [place] Plans to travel to [place] for four weeks starting March 26, 2024. Needs sufficient medication for the trip duration. - Prescribe one month supply of Tarceva - Attempt to prescribe a higher amount of cough medication, considering narcotic restrictions - Arrange  follow-up appointment for a week or ten days after her return, around the first week of June 2025   The patient was advised to call immediately if she has any other concerning symptoms in the interval. All questions were answered. The patient knows to call the clinic with any problems, questions or concerns. We can certainly see the patient much sooner if necessary.  The total time spent in the appointment was 30 minutes.  Disclaimer: This note was dictated with voice recognition software. Similar sounding words can inadvertently be transcribed and may not be corrected upon review.

## 2024-03-18 ENCOUNTER — Other Ambulatory Visit: Payer: Medicare Other

## 2024-03-18 ENCOUNTER — Ambulatory Visit: Payer: Medicare Other | Admitting: Internal Medicine

## 2024-04-27 NOTE — Progress Notes (Unsigned)
  Electrophysiology Office Note:   Date:  04/29/2024  ID:  [name], DOB June 21, 1961, MRN [id number]  Primary Cardiologist: None Primary Heart Failure: None Electrophysiologist: Richardo Chandler, MD      History of Present Illness:   [name] is a 63 y.o. female with h/o SVT / MAT, NSCLC, adenocarcinoma s/p RLL lobectomy & chemo/XRT seen today for routine electrophysiology followup.   Since last being seen in our clinic the patient reports she has largely been doing well overall. She has to take her as needed cardizem  1-2 times per week.  Just traveled home to [place] and has jet lag.    She denies chest pain, palpitations, dyspnea, PND, orthopnea, nausea, vomiting, dizziness, syncope, edema, weight gain, or early satiety.   Review of systems complete and found to be negative unless listed in HPI.   EP Information / Studies Reviewed:    EKG is not ordered today. EKG from 03/06/24 reviewed which showed junctional bradycardia 53 bpm      Studies:  ECHO 04/2017 > LVEF 60-65%   Arrhythmia / AAD MAT / SVT            Physical Exam:   VS:  BP 120/80   Pulse 63   Ht 4\' 11"  (1.499 m)   Wt 111 lb 12.8 oz (50.7 kg)   LMP 02/10/2012   SpO2 98%   BMI 22.58 kg/m    Wt Readings from Last 3 Encounters:  04/29/24 111 lb 12.8 oz (50.7 kg)  03/11/24 112 lb 6.4 oz (51 kg)  03/01/24 113 lb 1.5 oz (51.3 kg)     GEN: Well nourished, well developed in no acute distress NECK: No JVD; No carotid bruits CARDIAC: Regular rate and rhythm, SEM noted 2nd ICS RSB, no rubs, gallops RESPIRATORY:  Clear to auscultation without rales, wheezing or rhonchi  ABDOMEN: Soft, non-tender, non-distended EXTREMITIES:  No edema; No deformity   ASSESSMENT AND PLAN:    SVT  Palpitations  SND / Sinus Pauses  -continue diltiazem  120 mg daily  -diltiazem  60 mg PRN palpitations / elevated HR  -minimal symptom burden   Murmur  Noted calcification on last ECHO in 2018 "bulky calcification of the medial mitral annulus &  intervalvular fibrosa extending into base of AV -update ECHO   Hypertension  -well controlled on current regimen     Follow up with Dr. Rodolfo Clan or EP APP in 6 months  Signed, Creighton Doffing, NP-C, AGACNP-BC [facility] - Electrophysiology  04/29/2024, 10:23 AM

## 2024-04-29 ENCOUNTER — Encounter: Payer: Self-pay | Admitting: Pulmonary Disease

## 2024-04-29 ENCOUNTER — Ambulatory Visit: Attending: Pulmonary Disease | Admitting: Pulmonary Disease

## 2024-04-29 ENCOUNTER — Other Ambulatory Visit: Payer: Self-pay | Admitting: Pulmonary Disease

## 2024-04-29 VITALS — BP 120/80 | HR 63 | Ht 59.0 in | Wt 111.8 lb

## 2024-04-29 DIAGNOSIS — I498 Other specified cardiac arrhythmias: Secondary | ICD-10-CM | POA: Diagnosis not present

## 2024-04-29 DIAGNOSIS — I10 Essential (primary) hypertension: Secondary | ICD-10-CM | POA: Diagnosis not present

## 2024-04-29 DIAGNOSIS — R011 Cardiac murmur, unspecified: Secondary | ICD-10-CM | POA: Diagnosis not present

## 2024-04-29 DIAGNOSIS — I471 Supraventricular tachycardia, unspecified: Secondary | ICD-10-CM | POA: Insufficient documentation

## 2024-04-29 MED ORDER — DILTIAZEM HCL 60 MG PO TABS: 60.0000 mg | ORAL_TABLET | ORAL | 3 refills | Status: AC | PRN

## 2024-04-29 MED ORDER — DILTIAZEM HCL ER BEADS 120 MG PO CP24: 120.0000 mg | ORAL_CAPSULE | Freq: Every day | ORAL | 3 refills | Status: AC

## 2024-04-29 NOTE — Patient Instructions (Signed)
 Medication Instructions:   Your physician recommends that you continue on your current medications as directed. Please refer to the Current Medication list given to you today.   *If you need a refill on your cardiac medications before your next appointment, please call your pharmacy*   Lab Work:  NONE ORDERED  TODAY    If you have labs (blood work) drawn today and your tests are completely normal, you will receive your results only by: MyChart Message (if you have MyChart) OR A paper copy in the mail If you have any lab test that is abnormal or we need to change your treatment, we will call you to review the results.    Testing/Procedures: Your physician has requested that you have an echocardiogram. Echocardiography is a painless test that uses sound waves to create images of your heart. It provides your doctor with information about the size and shape of your heart and how well your heart's chambers and valves are working. This procedure takes approximately one hour. There are no restrictions for this procedure. Please do NOT wear cologne, perfume, aftershave, or lotions (deodorant is allowed). Please arrive 15 minutes prior to your appointment time.  Please note: We ask at that you not bring children with you during ultrasound (echo/ vascular) testing. Due to room size and safety concerns, children are not allowed in the ultrasound rooms during exams. Our front office staff cannot provide observation of children in our lobby area while testing is being conducted. An adult accompanying a patient to their appointment will only be allowed in the ultrasound room at the discretion of the ultrasound technician under special circumstances. We apologize for any inconvenience.    Follow-Up: At [facility], you and your health needs are our priority.  As part of our continuing mission to provide you with exceptional heart care, our providers are all part of one team.  This team  includes your primary Cardiologist (physician) and Advanced Practice Providers or APPs (Physician Assistants and Nurse Practitioners) who all work together to provide you with the care you need, when you need it.    Your next appointment:    6 month(s)   Provider:    You may see  Creighton Doffing, NP    We recommend signing up for the patient portal called "MyChart".  Sign up information is provided on this After Visit Summary.  MyChart is used to connect with patients for Virtual Visits (Telemedicine).  Patients are able to view lab/test results, encounter notes, upcoming appointments, etc.  Non-urgent messages can be sent to your provider as well.   To learn more about what you can do with MyChart, go to [email or web address].    Other Instructions

## 2024-05-13 ENCOUNTER — Other Ambulatory Visit

## 2024-05-13 ENCOUNTER — Ambulatory Visit (INDEPENDENT_AMBULATORY_CARE_PROVIDER_SITE_OTHER)

## 2024-05-13 ENCOUNTER — Other Ambulatory Visit: Payer: Self-pay | Admitting: Internal Medicine

## 2024-05-13 ENCOUNTER — Inpatient Hospital Stay

## 2024-05-13 ENCOUNTER — Inpatient Hospital Stay: Attending: Internal Medicine | Admitting: Internal Medicine

## 2024-05-13 VITALS — BP 143/84 | HR 92 | Temp 98.7°F | Resp 16 | Ht 58.5 in | Wt 109.4 lb

## 2024-05-13 VITALS — BP 128/62 | HR 84 | Ht 58.5 in | Wt 109.4 lb

## 2024-05-13 DIAGNOSIS — Z95828 Presence of other vascular implants and grafts: Secondary | ICD-10-CM

## 2024-05-13 DIAGNOSIS — H9193 Unspecified hearing loss, bilateral: Secondary | ICD-10-CM | POA: Diagnosis not present

## 2024-05-13 DIAGNOSIS — C3431 Malignant neoplasm of lower lobe, right bronchus or lung: Secondary | ICD-10-CM

## 2024-05-13 DIAGNOSIS — Z0000 Encounter for general adult medical examination without abnormal findings: Secondary | ICD-10-CM | POA: Diagnosis not present

## 2024-05-13 DIAGNOSIS — C3490 Malignant neoplasm of unspecified part of unspecified bronchus or lung: Secondary | ICD-10-CM

## 2024-05-13 DIAGNOSIS — Z1159 Encounter for screening for other viral diseases: Secondary | ICD-10-CM

## 2024-05-13 DIAGNOSIS — Z0100 Encounter for examination of eyes and vision without abnormal findings: Secondary | ICD-10-CM

## 2024-05-13 DIAGNOSIS — C3411 Malignant neoplasm of upper lobe, right bronchus or lung: Secondary | ICD-10-CM | POA: Diagnosis not present

## 2024-05-13 DIAGNOSIS — Z79899 Other long term (current) drug therapy: Secondary | ICD-10-CM | POA: Insufficient documentation

## 2024-05-13 DIAGNOSIS — Z124 Encounter for screening for malignant neoplasm of cervix: Secondary | ICD-10-CM | POA: Diagnosis not present

## 2024-05-13 DIAGNOSIS — Z23 Encounter for immunization: Secondary | ICD-10-CM

## 2024-05-13 DIAGNOSIS — R059 Cough, unspecified: Secondary | ICD-10-CM | POA: Diagnosis not present

## 2024-05-13 LAB — CMP (CANCER CENTER ONLY)
ALT: 11 U/L (ref 0–44)
AST: 20 U/L (ref 15–41)
Albumin: 4.3 g/dL (ref 3.5–5.0)
Alkaline Phosphatase: 87 U/L (ref 38–126)
Anion gap: 6 (ref 5–15)
BUN: 11 mg/dL (ref 8–23)
CO2: 31 mmol/L (ref 22–32)
Calcium: 8.9 mg/dL (ref 8.9–10.3)
Chloride: 104 mmol/L (ref 98–111)
Creatinine: 0.49 mg/dL (ref 0.44–1.00)
GFR, Estimated: >60 mL/min (ref >60)
Glucose, Bld: 88 mg/dL (ref 70–99)
Potassium: 3.7 mmol/L (ref 3.5–5.1)
Sodium: 141 mmol/L (ref 135–145)
Total Bilirubin: 0.6 mg/dL (ref 0.0–1.2)
Total Protein: 7.7 g/dL (ref 6.5–8.1)

## 2024-05-13 LAB — CBC WITH DIFFERENTIAL (CANCER CENTER ONLY)
Abs Immature Granulocytes: 0.01 10*3/uL (ref 0.00–0.07)
Basophils Absolute: 0 10*3/uL (ref 0.0–0.1)
Basophils Relative: 1 %
Eosinophils Absolute: 0.1 10*3/uL (ref 0.0–0.5)
Eosinophils Relative: 1 %
HCT: 41.8 % (ref 36.0–46.0)
Hemoglobin: 13.3 g/dL (ref 12.0–15.0)
Immature Granulocytes: 0 %
Lymphocytes Relative: 27 %
Lymphs Abs: 1.8 10*3/uL (ref 0.7–4.0)
MCH: 27.3 pg (ref 26.0–34.0)
MCHC: 31.8 g/dL (ref 30.0–36.0)
MCV: 85.8 fL (ref 80.0–100.0)
Monocytes Absolute: 0.5 10*3/uL (ref 0.1–1.0)
Monocytes Relative: 7 %
Neutro Abs: 4.4 10*3/uL (ref 1.7–7.7)
Neutrophils Relative %: 64 %
Platelet Count: 227 10*3/uL (ref 150–400)
RBC: 4.87 MIL/uL (ref 3.87–5.11)
RDW: 14.2 % (ref 11.5–15.5)
WBC Count: 6.9 10*3/uL (ref 4.0–10.5)
nRBC: 0 % (ref 0.0–0.2)

## 2024-05-13 MED ORDER — HEPARIN SOD (PORK) LOCK FLUSH 100 UNIT/ML IV SOLN
500.0000 [IU] | Freq: Once | INTRAVENOUS | Status: AC | PRN
  Administered 2024-05-13: 500 [IU] via INTRAVENOUS

## 2024-05-13 MED ORDER — SODIUM CHLORIDE 0.9% FLUSH
10.0000 mL | INTRAVENOUS | Status: DC | PRN
  Administered 2024-05-13: 10 mL via INTRAVENOUS

## 2024-05-13 MED ORDER — CLINDAMYCIN PHOSPHATE 1 % EX LOTN: TOPICAL_LOTION | Freq: Two times a day (BID) | CUTANEOUS | 0 refills | Status: DC

## 2024-05-13 NOTE — Progress Notes (Signed)
 #        [facility] Telephone:([phone]) [phone]   Fax:([phone]) [phone]  OFFICE PROGRESS NOTE  Kathryn Hammersmith, MD [address] [place] [address]  PRINCIPAL DIAGNOSIS: Local recurrence of non-small cell lung cancer, adenocarcinoma, initially diagnosed as stage IB (T2a N0 M0) in March of 2010.   PRIOR THERAPY:  Status post right lower lobectomy under the care of Dr. Sherene Dilling on 04/08/2009. Status post palliative radiotherapy to the right lower lobe recurrent lung mass under the care of Dr. Jeryl Moris, completed on June 08, 2011. Systemic chemotherapy with carboplatin  for AUC of 5 and Alimta  500 mg/M2 every 3 weeks. She is status post 3 cycles. SBRT to the recurrent right lung tumor under the care of Dr. Lorri Rota, last dose on 10/30/23.   CURRENT THERAPY: Tarceva  150 mg by mouth daily, therapy beginning 07/24/2012. Status post approximately 142 months of therapy.   INTERVAL HISTORY: 63 years old Asian female returns to the clinic today for follow-up visit accompanied by her interpreter.Discussed the use of AI scribe software for clinical note transcription with the patient, who gave verbal consent to proceed.  History of Present Illness   [name] is a 63 year old female with recurrent non-small cell lung cancer who presents for evaluation and blood work.  She has a history of recurrent non-small cell lung cancer, adenocarcinoma, initially diagnosed in March 2010. She has been on Tarceva  150 mg orally once daily since July 24, 2012. This month, she has not experienced significant side effects from Tarceva , with the rash being less severe than previously. She is currently awaiting a new supply of the medication, which has not yet arrived this month.  She recently returned from a four-week trip to [place] on Apr 24, 2024. She describes the trip as 'not much fun' due to the heat and reports feeling unwell since returning, attributing her symptoms to jet lag. She mentions  difficulty with sleep, stating 'sometimes sleep, sometimes not sleep.'  She occasionally experiences coughing, which sometimes leads to spitting, possibly due to airway irritation.       MEDICAL HISTORY: Past Medical History:  Diagnosis Date   Drug-induced skin rash 02/27/2017   Encounter for antineoplastic chemotherapy 01/19/2016   History of radiation therapy 05/02/11 to 06/08/11   right lung   Lung cancer (HCC)    right lower lobe adenocarcinoma    ALLERGIES:  is allergic to codeine  and doxycycline .  MEDICATIONS:  Current Outpatient Medications  Medication Sig Dispense Refill   albuterol  (PROAIR  HFA) 108 (90 Base) MCG/ACT inhaler Inhale 1-2 puffs into the lungs every 6 (six) hours as needed for wheezing or shortness of breath. 6.7 g 2   chlorpheniramine-HYDROcodone  (TUSSIONEX) 10-8 MG/5ML Take 5 mLs by mouth every 12 (twelve) hours as needed for cough. 473 mL 0   clindamycin  (CLEOCIN  T) 1 % lotion Apply topically 2 (two) times daily. 60 mL 0   diltiazem  (CARDIZEM ) 60 MG tablet Take 1 tablet (60 mg total) by mouth as needed (high heart rate). 60 tablet 3   diltiazem  (TIADYLT  ER) 120 MG 24 hr capsule Take 1 capsule by mouth daily. 90 capsule 3   doxycycline  (VIBRA -TABS) 100 MG tablet Take 1 tablet (100 mg total) by mouth 2 (two) times daily. 20 tablet 0   erlotinib  (TARCEVA ) 150 MG tablet Take 1 tablet (150 mg total) by mouth daily. Take on an empty stomach at least 1 hour before or 2 hours after food. 30 tablet 6  prochlorperazine  (COMPAZINE ) 10 MG tablet Take 1 tablet (10 mg total) by mouth every 6 (six) hours as needed. 30 tablet 2   silver  sulfADIAZINE  (SILVADENE ) 1 % cream Apply topically daily. Cleanse foot wound with water, pat dry. Apply Silvadene  daily. Top with dry dressing     No current facility-administered medications for this visit.    SURGICAL HISTORY:  Past Surgical History:  Procedure Laterality Date   BRONCHIAL BIOPSY  09/05/2023   Procedure: BRONCHIAL BIOPSIES;   Surgeon: Denson Flake, MD;  Location: [facility] ENDOSCOPY;  Service: Pulmonary;;   BRONCHIAL BRUSHINGS  09/05/2023   Procedure: BRONCHIAL BRUSHINGS;  Surgeon: Denson Flake, MD;  Location: [facility] ENDOSCOPY;  Service: Pulmonary;;   BRONCHIAL NEEDLE ASPIRATION BIOPSY  09/05/2023   Procedure: BRONCHIAL NEEDLE ASPIRATION BIOPSIES;  Surgeon: Denson Flake, MD;  Location: [facility] ENDOSCOPY;  Service: Pulmonary;;   FIDUCIAL MARKER PLACEMENT  09/05/2023   Procedure: FIDUCIAL MARKER PLACEMENT;  Surgeon: Denson Flake, MD;  Location: [facility] ENDOSCOPY;  Service: Pulmonary;;   LUNG LOBECTOMY  04/18/2009   RLL   PORTACATH PLACEMENT  02/29/2012   Procedure: INSERTION PORT-A-CATH;  Surgeon: Gwendel Lemme, MD;  Location: [facility] OR;  Service: Thoracic;  Laterality: Left;  PowerPort 8 F attachable in left internal jugular.    REVIEW OF SYSTEMS:  Constitutional: positive for fatigue Eyes: negative Ears, nose, mouth, throat, and face: negative Respiratory: positive for cough Cardiovascular: negative Gastrointestinal: negative Genitourinary:negative Integument/breast: positive for rash Hematologic/lymphatic: negative Musculoskeletal:negative Neurological: negative Behavioral/Psych: negative Endocrine: negative Allergic/Immunologic: negative   PHYSICAL EXAMINATION: General appearance: alert, cooperative, appears stated age, fatigued, and no distress Head: Normocephalic, without obvious abnormality, atraumatic Neck: no adenopathy, no JVD, supple, symmetrical, trachea midline, and thyroid  not enlarged, symmetric, no tenderness/mass/nodules Lymph nodes: Cervical, supraclavicular, and axillary nodes normal. Resp: clear to auscultation bilaterally Back: symmetric, no curvature. ROM normal. No CVA tenderness. Cardio: regular rate and rhythm, S1, S2 normal, no murmur, click, rub or gallop GI: soft, non-tender; bowel sounds normal; no masses,  no organomegaly Extremities: extremities normal, atraumatic, no cyanosis or  edema Neurologic: Alert and oriented X 3, normal strength and tone. Normal symmetric reflexes. Normal coordination and gait  ECOG PERFORMANCE STATUS: 1 - Symptomatic but completely ambulatory  Blood pressure (!) 143/84, pulse 92, temperature 98.7 F (37.1 C), temperature source Oral, resp. rate 16, height 4' 10.5 (1.486 m), weight 109 lb 6.4 oz (49.6 kg), last menstrual period 02/10/2012, SpO2 100%.  LABORATORY DATA: Lab Results  Component Value Date   WBC 6.9 05/13/2024   HGB 13.3 05/13/2024   HCT 41.8 05/13/2024   MCV 85.8 05/13/2024   PLT 227 05/13/2024      Chemistry      Component Value Date/Time   NA 141 05/13/2024 1011   NA 143 10/30/2017 1031   K 3.7 05/13/2024 1011   K 4.3 10/30/2017 1031   CL 104 05/13/2024 1011   CL 102 04/30/2013 1002   CO2 31 05/13/2024 1011   CO2 29 10/30/2017 1031   BUN 11 05/13/2024 1011   BUN 14.6 10/30/2017 1031   CREATININE 0.49 05/13/2024 1011   CREATININE 0.7 10/30/2017 1031      Component Value Date/Time   CALCIUM  8.9 05/13/2024 1011   CALCIUM  9.2 10/30/2017 1031   ALKPHOS 87 05/13/2024 1011   ALKPHOS 103 10/30/2017 1031   AST 20 05/13/2024 1011   AST 20 10/30/2017 1031   ALT 11 05/13/2024 1011   ALT 14 10/30/2017 1031   BILITOT 0.6 05/13/2024  1011   BILITOT 0.79 10/30/2017 1031       RADIOGRAPHIC STUDIES: No results found.    ASSESSMENT AND PLAN:  This is a very pleasant 63 years old Asian female with recurrent non-small cell lung cancer, adenocarcinoma status post surgical resection followed by systemic chemotherapy with carboplatin  and Alimta  followed by disease progression and the patient was started on treatment with Tarceva  150 mg by mouth daily status post 139 months  The patient was referred to Dr. Baldwin Levee and she underwent bronchoscopy that showed recurrent adenocarcinoma. She was treated with SBRT to the right upper lobe lung mass under the care of Dr. Lorri Rota. The patient has been tolerating her treatment with  Tarceva  fairly well.     Recurrent non-small cell lung cancer, adenocarcinoma Recurrent non-small cell lung cancer, adenocarcinoma, initially diagnosed in March 2010. Currently on erlotinib  150 mg PO daily since July 24, 2012. No significant side effects reported this month, and the rash is less severe than previously. Blood work shows no issues. She reports feeling well during recent travel to [place], though experiencing some jet lag. Occasionally expectorates phlegm, possibly due to airway irritation from coughing. - Continue erlotinib  150 mg PO daily - Prescribe clindamycin  lotion for rash - Schedule follow-up appointment in three months with a scan - Send prescription to [facility]   The patient was advised to call immediately if she has any concerning symptoms in the interval. All questions were answered. The patient knows to call the clinic with any problems, questions or concerns. We can certainly see the patient much sooner if necessary.  The total time spent in the appointment was 30 minutes.  Disclaimer: This note was dictated with voice recognition software. Similar sounding words can inadvertently be transcribed and may not be corrected upon review.

## 2024-05-13 NOTE — Patient Instructions (Signed)
 [name] , Thank you for taking time out of your busy schedule to complete your Annual Wellness Visit with me. I enjoyed our conversation and look forward to speaking with you again next year. I, as well as your care team,  appreciate your ongoing commitment to your health goals. Please review the following plan we discussed and let me know if I can assist you in the future. Your Game plan/ To Do List    Referrals: If you haven't heard from the office you've been referred to, please reach out to them at the phone provided.  Referral to an Audiologist for a hearing exam, Referral to Dr Steve El (Gynecologist), and Dr Faylene Hoots w/[facility] for an eye exam.  Ordered lab - Hepatitis C Screening Follow up Visits: Next Medicare AWV with our clinical staff: 05/19/2025   Have you seen your provider in the last 6 months (3 months if uncontrolled diabetes)? Yes Next Office Visit with your provider: 07/01/2024 at 10:00am  Clinician Recommendations:  Aim for 30 minutes of exercise or brisk walking, 6-8 glasses of water, and 5 servings of fruits and vegetables each day. Educated and advised on getting the Shingles vaccines in 2025.      This is a list of the screening recommended for you and due dates:  Health Maintenance  Topic Date Due   Hepatitis C Screening  Never done   Pneumococcal Vaccination (1 of 2 - PCV) Never done   Zoster (Shingles) Vaccine (1 of 2) Never done   Pap with HPV screening  Never done   Colon Cancer Screening  Never done   COVID-19 Vaccine (4 - 2024-25 season) 07/30/2023   Flu Shot  06/28/2024   Mammogram  02/26/2025   Medicare Annual Wellness Visit  05/13/2025   HIV Screening  Completed   HPV Vaccine  Aged Out   Meningitis B Vaccine  Aged Out   DTaP/Tdap/Td vaccine  Discontinued    Advanced directives: (Declined) Advance directive discussed with you today. Even though you declined this today, please call our office should you change your mind, and we can give you the proper  paperwork for you to fill out. Advance Care Planning is important because it:  [x]  Makes sure you receive the medical care that is consistent with your values, goals, and preferences  [x]  It provides guidance to your family and loved ones and reduces their decisional burden about whether or not they are making the right decisions based on your wishes.  Follow the link provided in your after visit summary or read over the paperwork we have mailed to you to help you started getting your Advance Directives in place. If you need assistance in completing these, please reach out to us  so that we can help you!

## 2024-05-13 NOTE — Progress Notes (Signed)
 Subjective:  Please attest and cosign this visit due to patients primary care provider not being in the office at the time the visit was completed.  (Pt of Dr Melven Stable. Vedia Geralds)   [name] is a 63 y.o. who presents for a Medicare Wellness preventive visit.  As a reminder, Annual Wellness Visits don't include a physical exam, and some assessments may be limited, especially if this visit is performed virtually. We may recommend an in-person follow-up visit with your provider if needed.  Visit Complete: In person  Persons Participating in Visit: Patient assisted by Interpretor.  AWV Questionnaire: No: Patient Medicare AWV questionnaire was not completed prior to this visit.  Cardiac Risk Factors include: advanced age (>22men, >44 women);hypertension     Objective:    Today's Vitals   05/13/24 0820  BP: 128/62  Pulse: 84  SpO2: 98%  Weight: 109 lb 6.4 oz (49.6 kg)  Height: 4' 10.5 (1.486 m)   Body mass index is 22.48 kg/m.     05/13/2024    8:19 AM 03/01/2024   12:50 PM 10/22/2023    3:00 PM 10/22/2023    4:33 AM 10/20/2023    9:39 PM 09/25/2023   10:46 AM 09/05/2023    6:40 AM  Advanced Directives  Does Patient Have a Medical Advance Directive? No No  No No No No  Would patient like information on creating a medical advance directive? No - Patient declined  No - Patient declined  No - Patient declined No - Patient declined     Current Medications (verified) Outpatient Encounter Medications as of 05/13/2024  Medication Sig   albuterol  (PROAIR  HFA) 108 (90 Base) MCG/ACT inhaler Inhale 1-2 puffs into the lungs every 6 (six) hours as needed for wheezing or shortness of breath.   chlorpheniramine-HYDROcodone  (TUSSIONEX) 10-8 MG/5ML Take 5 mLs by mouth every 12 (twelve) hours as needed for cough.   clindamycin  (CLEOCIN  T) 1 % lotion Apply topically 2 (two) times daily.   diltiazem  (CARDIZEM ) 60 MG tablet Take 1 tablet (60 mg total) by mouth as needed (high heart rate).   diltiazem   (TIADYLT  ER) 120 MG 24 hr capsule Take 1 capsule by mouth daily.   erlotinib  (TARCEVA ) 150 MG tablet Take 1 tablet (150 mg total) by mouth daily. Take on an empty stomach at least 1 hour before or 2 hours after food.   prochlorperazine  (COMPAZINE ) 10 MG tablet Take 1 tablet (10 mg total) by mouth every 6 (six) hours as needed.   silver  sulfADIAZINE  (SILVADENE ) 1 % cream Apply topically daily. Cleanse foot wound with water, pat dry. Apply Silvadene  daily. Top with dry dressing   doxycycline  (VIBRA -TABS) 100 MG tablet Take 1 tablet (100 mg total) by mouth 2 (two) times daily.   No facility-administered encounter medications on file as of 05/13/2024.    Allergies (verified) Codeine  and Doxycycline    History: Past Medical History:  Diagnosis Date   Drug-induced skin rash 02/27/2017   Encounter for antineoplastic chemotherapy 01/19/2016   History of radiation therapy 05/02/11 to 06/08/11   right lung   Lung cancer [facility])    right lower lobe adenocarcinoma   Past Surgical History:  Procedure Laterality Date   BRONCHIAL BIOPSY  09/05/2023   Procedure: BRONCHIAL BIOPSIES;  Surgeon: Denson Flake, MD;  Location: [facility] ENDOSCOPY;  Service: Pulmonary;;   BRONCHIAL BRUSHINGS  09/05/2023   Procedure: BRONCHIAL BRUSHINGS;  Surgeon: Denson Flake, MD;  Location: [facility] ENDOSCOPY;  Service: Pulmonary;;   BRONCHIAL NEEDLE ASPIRATION BIOPSY  09/05/2023   Procedure: BRONCHIAL NEEDLE ASPIRATION BIOPSIES;  Surgeon: Denson Flake, MD;  Location: [facility] ENDOSCOPY;  Service: Pulmonary;;   FIDUCIAL MARKER PLACEMENT  09/05/2023   Procedure: FIDUCIAL MARKER PLACEMENT;  Surgeon: Denson Flake, MD;  Location: [facility] ENDOSCOPY;  Service: Pulmonary;;   LUNG LOBECTOMY  04/18/2009   RLL   PORTACATH PLACEMENT  02/29/2012   Procedure: INSERTION PORT-A-CATH;  Surgeon: Gwendel Lemme, MD;  Location: [facility] OR;  Service: Thoracic;  Laterality: Left;  PowerPort 8 F attachable in left internal jugular.   Family History  Problem Relation Age  of Onset   Heart Problems Mother    Liver disease Neg Hx    Colon cancer Neg Hx    Esophageal cancer Neg Hx    Social History   Socioeconomic History   Marital status: Married    Spouse name: Not on file   Number of children: 7   Years of education: Not on file   Highest education level: Not on file  Occupational History   Occupation: stay at home mom  Tobacco Use   Smoking status: Never   Smokeless tobacco: Never  Vaping Use   Vaping status: Never Used  Substance and Sexual Activity   Alcohol use: No   Drug use: No   Sexual activity: Not Currently  Other Topics Concern   Not on file  Social History Narrative   09/01/2014   The patient is a 63 year old [place] woman.   The patient is married and has 7 children. One child passed away.   The patient came to the [place]  in approximately 1990. They were in [place]  for 10-11 years and then moved to [place], [place]  since then.   Patient does not smoke, drink, or use illicit drugs.   Patient has not used smokeless tobacco products.   Fun/Hobby: Go shopping    Social Drivers of Health   Financial Resource Strain: Low Risk  (05/13/2024)   Overall Financial Resource Strain (CARDIA)    Difficulty of Paying Living Expenses: Not hard at all  Food Insecurity: No Food Insecurity (05/13/2024)   Hunger Vital Sign    Worried About Running Out of Food in the Last Year: Never true    Ran Out of Food in the Last Year: Never true  Transportation Needs: No Transportation Needs (05/13/2024)   PRAPARE - Administrator, Civil Service (Medical): No    Lack of Transportation (Non-Medical): No  Physical Activity: Insufficiently Active (05/13/2024)   Exercise Vital Sign    Days of Exercise per Week: 7 days    Minutes of Exercise per Session: 20 min  Stress: No Stress Concern Present (05/13/2024)   [employer] of Occupational Health - Occupational Stress Questionnaire    Feeling of Stress: Not at all  Social  Connections: Socially Integrated (05/13/2024)   Social Connection and Isolation Panel    Frequency of Communication with Friends and Family: More than three times a week    Frequency of Social Gatherings with Friends and Family: More than three times a week    Attends Religious Services: More than 4 times per year    Active Member of [employer] or Organizations: Yes    Attends Engineer, structural: More than 4 times per year    Marital Status: Married    Tobacco Counseling Counseling given: No    Clinical Intake:  Pre-visit preparation completed: Yes  Pain : No/denies pain     BMI - recorded: 22.48  Nutritional Status: BMI of 19-24  Normal Nutritional Risks: None Diabetes: No  Lab Results  Component Value Date   HGBA1C 6.2 02/21/2022     How often do you need to have someone help you when you read instructions, pamphlets, or other written materials from your doctor or pharmacy?: 1 - Never  Interpreter Needed?: Yes Interpreter Agency: [place] Patient Declined Interpreter : No Patient signed [place] waiver: Yes  Information entered by :: Kandy Orris, CMA   Activities of Daily Living     05/13/2024    8:26 AM 10/22/2023    9:53 AM  In your present state of health, do you have any difficulty performing the following activities:  Hearing? 1   Comment c/o hearing difficulty sometimes   Vision? 0   Difficulty concentrating or making decisions? 0   Walking or climbing stairs? 0   Dressing or bathing? 0   Doing errands, shopping? 0 0  Preparing Food and eating ? N   Using the Toilet? N   In the past six months, have you accidently leaked urine? Y   Comment wears a pantyliner   Do you have problems with loss of bowel control? N   Managing your Medications? N   Managing your Finances? N   Housekeeping or managing your Housekeeping? N     Patient Care Team: Elvira Hammersmith, MD as PCP - General (Internal Medicine) Verona Goodwill, MD as PCP -  Electrophysiology (Cardiology)  I have updated your Care Teams any recent Medical Services you may have received from other providers in the past year.     Assessment:   This is a routine wellness examination for [name].  Hearing/Vision screen Hearing Screening - Comments:: C/o hearing concerns -left ear.  Referral to Audiologist Vision Screening - Comments:: Wears rx glasses - referral to Dr Faylene Hoots   Goals Addressed               This Visit's Progress     Patient Stated        Patient Stated (pt-stated)        Patient stated she plans to stay well       Depression Screen     05/13/2024    8:39 AM 12/25/2023    1:08 PM 09/25/2023   10:53 AM 06/19/2023    1:11 PM 12/19/2022    1:06 PM 02/21/2022    9:52 AM 05/10/2016    2:33 PM  PHQ 2/9 Scores  PHQ - 2 Score 0 0 0 0 0 0 0  PHQ- 9 Score 4          Fall Risk     05/13/2024    8:33 AM 12/25/2023    1:08 PM 06/19/2023    1:11 PM 12/19/2022    1:06 PM 02/21/2022    9:52 AM  Fall Risk   Falls in the past year? 0 0 0 0 0  Number falls in past yr: 0 0 0 0   Injury with Fall? 0 0 0 0   Risk for fall due to : No Fall Risks No Fall Risks No Fall Risks No Fall Risks   Follow up Falls evaluation completed;Falls prevention discussed Falls evaluation completed Falls evaluation completed Falls evaluation completed       Data saved with a previous flowsheet row definition    MEDICARE RISK AT HOME:  Medicare Risk at Home Any stairs in or around the home?: No If so, are there any without  handrails?: No Home free of loose throw rugs in walkways, pet beds, electrical cords, etc?: Yes Adequate lighting in your home to reduce risk of falls?: Yes Life alert?: No Use of a cane, walker or w/c?: No Grab bars in the bathroom?: Yes Shower chair or bench in shower?: No Elevated toilet seat or a handicapped toilet?: No  TIMED UP AND GO:  Was the test performed?  No  Cognitive Function: 6CIT completed        05/13/2024    8:40 AM   6CIT Screen  What Year? 0 points  What month? 0 points  What time? 0 points  Count back from 20 4 points  Months in reverse 4 points  Repeat phrase 10 points  Total Score 18 points    Immunizations Immunization History  Administered Date(s) Administered   Influenza, Seasonal, Injecte, Preservative Fre 09/12/2023   Influenza,inj,Quad PF,6+ Mos 10/13/2014, 09/15/2015, 11/02/2017, 08/12/2019, 08/24/2020, 09/27/2021, 09/26/2022   PFIZER(Purple Top)SARS-COV-2 Vaccination 02/15/2020, 03/07/2020, 08/24/2020   PNEUMOCOCCAL CONJUGATE-20 05/13/2024    Screening Tests Health Maintenance  Topic Date Due   Hepatitis C Screening  Never done   Zoster Vaccines- Shingrix (1 of 2) Never done   Cervical Cancer Screening (HPV/Pap Cotest)  Never done   Colonoscopy  Never done   COVID-19 Vaccine (4 - 2024-25 season) 07/30/2023   INFLUENZA VACCINE  06/28/2024   MAMMOGRAM  02/26/2025   Medicare Annual Wellness (AWV)  05/13/2025   Pneumococcal Vaccine 16-94 Years old  Completed   HIV Screening  Completed   HPV VACCINES  Aged Out   Meningococcal B Vaccine  Aged Out   DTaP/Tdap/Td  Discontinued    Health Maintenance  Health Maintenance Due  Topic Date Due   Hepatitis C Screening  Never done   Zoster Vaccines- Shingrix (1 of 2) Never done   Cervical Cancer Screening (HPV/Pap Cotest)  Never done   Colonoscopy  Never done   COVID-19 Vaccine (4 - 2024-25 season) 07/30/2023   Health Maintenance Items Addressed:  Ordered a Hepatitis C Screening for today  Pneumovax vaccine given Left deltoid.   Pap Smear status: Referral to Dr Woodrow Hazy Referral to Audiologist, Bert Britain  Additional Screening:  Vision Screening: Recommended annual ophthalmology exams for early detection of glaucoma and other disorders of the eye. Would you like a referral to an eye doctor? Yes  to Dr Artie Laster of [facility]  Dental Screening: Recommended annual dental exams for proper oral  hygiene  Community Resource Referral / Chronic Care Management: CRR required this visit?  No   CCM required this visit?  No   Plan:    I have personally reviewed and noted the following in the patient's chart:   Medical and social history Use of alcohol, tobacco or illicit drugs  Current medications and supplements including opioid prescriptions. Patient is not currently taking opioid prescriptions. Functional ability and status Nutritional status Physical activity Advanced directives List of other physicians Hospitalizations, surgeries, and ER visits in previous 12 months Vitals Screenings to include cognitive, depression, and falls Referrals and appointments  In addition, I have reviewed and discussed with patient certain preventive protocols, quality metrics, and best practice recommendations. A written personalized care plan for preventive services as well as general preventive health recommendations were provided to patient.   Patria Bookbinder, CMA   05/13/2024   After Visit Summary: (In Person-Printed) AVS printed and given to the patient  Notes: Nothing significant to report at this time.

## 2024-05-14 LAB — HEPATITIS C ANTIBODY: Hepatitis C Ab: NONREACTIVE

## 2024-05-17 ENCOUNTER — Telehealth: Payer: Self-pay

## 2024-05-17 ENCOUNTER — Other Ambulatory Visit (HOSPITAL_COMMUNITY): Payer: Self-pay

## 2024-05-17 ENCOUNTER — Other Ambulatory Visit: Payer: Self-pay

## 2024-05-17 DIAGNOSIS — C3491 Malignant neoplasm of unspecified part of right bronchus or lung: Secondary | ICD-10-CM

## 2024-05-17 NOTE — Telephone Encounter (Signed)
 Patient's son, Arlee Lace Sin, called and left a voicemail stating that the patient's medication, Tarceva , was canceled through [facility] due to being out of stock.  Sent a message to [employer], pharmacist, regarding alternative options. She suggested that the patient could temporarily be switched to erlotinib  25 mg tablets and order a 15-day supply for $33.77 from [facility], with the hope that the 150 mg will be restocked soon. Staff message sent to Dr. Marguerita Shih for review.  Informed patient's son that our office will follow up with recommendations once reviewed.

## 2024-05-21 NOTE — Telephone Encounter (Signed)
 Spoke with patient's son, Florina Sin, and provided an update regarding the patient's medication, Erlotinib . Per Dr. Sherrod, it is okay for the patient to remain off the medication for a few more days, as he prefers the patient to be on the 150 mg tablets. If the medication remains out of stock, the matter will be addressed with Asberry later this week. Mr. Parker voiced understanding.

## 2024-05-22 MED ORDER — ERLOTINIB HCL 25 MG PO TABS: 150.0000 mg | ORAL_TABLET | Freq: Every day | ORAL | 0 refills | Status: DC

## 2024-05-22 NOTE — Telephone Encounter (Signed)
 Oral Chemotherapy Pharmacist Encounter   Erlotinib  150 mg tablets still not available through [facility] at this time. Erlotinib  25 mg tablets (qty #90 for 15 day supply) sent to [facility] for patient in the interim while waiting to see if erlotinib  150 mg tablets become available in the next 2 weeks. OK per Dr. Sherrod for patient to change tablet strength while the 150 mg tablets are out of stock.  I called and spoke with patient's son, Ra Sin and updated him on the above. He is going to attempt and order the 25 mg tablets when he gets off work later today. He knows that patient will start taking 6 tablets of erlotinib  25 mg tablet (150 mg total daily dose).  I also informed patient's son we have placed his mother on a grant wait list in case grant funding for NSCLC opens in the meantime.   I will continue to follow if erlotinib  150 mg tablets become available over the next 2 weeks.   [name], PharmD, BCPS, BCOP Hematology/Oncology Clinical Pharmacist [facility] and [facility] Oral Chemotherapy Navigation Clinics [phone] 05/22/2024 10:31 AM

## 2024-05-23 ENCOUNTER — Telehealth: Payer: Self-pay

## 2024-05-23 DIAGNOSIS — R059 Cough, unspecified: Secondary | ICD-10-CM

## 2024-05-23 DIAGNOSIS — C3431 Malignant neoplasm of lower lobe, right bronchus or lung: Secondary | ICD-10-CM

## 2024-05-23 NOTE — Telephone Encounter (Signed)
 Spoke with patients daughter, Molisa in regards to a refill on tussionex.  Informed daughter that the information will be relayed to Dr. Sherrod for review.

## 2024-05-24 ENCOUNTER — Other Ambulatory Visit: Payer: Self-pay | Admitting: Internal Medicine

## 2024-05-24 DIAGNOSIS — C3431 Malignant neoplasm of lower lobe, right bronchus or lung: Secondary | ICD-10-CM

## 2024-05-24 DIAGNOSIS — R059 Cough, unspecified: Secondary | ICD-10-CM

## 2024-05-24 MED ORDER — HYDROCOD POLI-CHLORPHE POLI ER 10-8 MG/5ML PO SUER: 5.0000 mL | Freq: Two times a day (BID) | ORAL | 0 refills | Status: DC | PRN

## 2024-05-28 ENCOUNTER — Encounter: Payer: Self-pay | Admitting: Internal Medicine

## 2024-06-03 NOTE — Telephone Encounter (Signed)
 Oral Chemotherapy Pharmacist Encounter   Spoke with patient's son, [name], to follow up regarding his mother's Erlotinib  prescription through [facility].   Informed son that the 150 mg tablet strength is now available through [facility], and I recommended going ahead and putting in a refill order for the 150 mg tablets that she has been on. Per the son, he had put in an order last week for the 25 mg tablets, but still has not received shipment. I informed him that per the website it appears those are now out of stock.   Patient still remains out of medication, and I have advised son to order the erlotinib  at his soonest availability. Patient's son will reorder the erlotinib  150 mg tablets this week.   [name], PharmD, BCPS, BCOP Hematology/Oncology Clinical Pharmacist [facility] and [facility] Oral Chemotherapy Navigation Clinics [phone] 06/03/2024 10:03 AM

## 2024-06-04 ENCOUNTER — Telehealth: Payer: Self-pay | Admitting: Medical Oncology

## 2024-06-04 DIAGNOSIS — C3490 Malignant neoplasm of unspecified part of unspecified bronchus or lung: Secondary | ICD-10-CM

## 2024-06-04 MED ORDER — ERLOTINIB HCL 150 MG PO TABS: 150.0000 mg | ORAL_TABLET | Freq: Every day | ORAL | 2 refills | Status: DC

## 2024-06-04 NOTE — Telephone Encounter (Signed)
 Son called for refill for Erlotinib  150 mg ( 150 mg tablets are now in stock) . Refill sent by Cassie Heilingoetter to [facility].

## 2024-06-17 ENCOUNTER — Ambulatory Visit (HOSPITAL_COMMUNITY)
Admission: RE | Admit: 2024-06-17 | Discharge: 2024-06-17 | Disposition: A | Discharge status: Home / Self Care | Source: Ambulatory Visit | Attending: Cardiology | Admitting: Cardiology

## 2024-06-17 DIAGNOSIS — R011 Cardiac murmur, unspecified: Secondary | ICD-10-CM | POA: Diagnosis not present

## 2024-06-17 DIAGNOSIS — I498 Other specified cardiac arrhythmias: Secondary | ICD-10-CM | POA: Insufficient documentation

## 2024-06-17 DIAGNOSIS — I471 Supraventricular tachycardia, unspecified: Secondary | ICD-10-CM | POA: Insufficient documentation

## 2024-06-17 LAB — ECHOCARDIOGRAM COMPLETE
Area-P 1/2: 3.92 cm2
S' Lateral: 2.7 cm

## 2024-06-18 ENCOUNTER — Ambulatory Visit: Payer: Self-pay | Admitting: Pulmonary Disease

## 2024-06-19 ENCOUNTER — Other Ambulatory Visit (HOSPITAL_COMMUNITY): Payer: Self-pay

## 2024-06-21 ENCOUNTER — Telehealth: Payer: Self-pay

## 2024-06-21 ENCOUNTER — Other Ambulatory Visit (HOSPITAL_COMMUNITY): Payer: Self-pay

## 2024-06-21 ENCOUNTER — Encounter: Payer: Self-pay | Admitting: Internal Medicine

## 2024-06-21 NOTE — Telephone Encounter (Signed)
 Oral Oncology Patient Advocate Encounter  Was successful in securing patient a grant from [employer] to provide copayment coverage for Terceva.  This will keep the out of pocket expense at $0.     [employer]  ID: [id number]   The billing information is as follows and has been shared with [facility].    RxBin: [id number] PCN: [id number] Member ID: [id number] Group ID: [id number] Dates of Eligibility: 06/19/24 as a conditional approval  Fund:  Non Small Cell Lung Cancer   Charlott Hamilton,  CPhT-Adv  she/her/hers [employer]  [facility] Pharmacy Technician Patient Advocate Specialist III [facility] Phone: [phone]  Fax: [phone] [email or web address]

## 2024-06-26 ENCOUNTER — Telehealth: Payer: Self-pay | Admitting: Medical Oncology

## 2024-06-26 ENCOUNTER — Other Ambulatory Visit: Payer: Self-pay | Admitting: Internal Medicine

## 2024-06-26 DIAGNOSIS — R059 Cough, unspecified: Secondary | ICD-10-CM

## 2024-06-26 DIAGNOSIS — C3431 Malignant neoplasm of lower lobe, right bronchus or lung: Secondary | ICD-10-CM

## 2024-06-26 MED ORDER — HYDROCOD POLI-CHLORPHE POLI ER 10-8 MG/5ML PO SUER
5.0000 mL | Freq: Two times a day (BID) | ORAL | 0 refills | Status: DC | PRN
Start: 2024-06-26 — End: 2024-08-12

## 2024-06-26 NOTE — Telephone Encounter (Signed)
 Tussionex refill requested. [facility] [address].

## 2024-07-01 ENCOUNTER — Encounter: Payer: Self-pay | Admitting: Emergency Medicine

## 2024-07-01 ENCOUNTER — Ambulatory Visit (INDEPENDENT_AMBULATORY_CARE_PROVIDER_SITE_OTHER): Payer: Medicare Other | Admitting: Emergency Medicine

## 2024-07-01 VITALS — BP 118/78 | HR 56 | Temp 98.3°F | Ht <= 58 in | Wt 113.0 lb

## 2024-07-01 DIAGNOSIS — C3431 Malignant neoplasm of lower lobe, right bronchus or lung: Secondary | ICD-10-CM

## 2024-07-01 DIAGNOSIS — I10 Essential (primary) hypertension: Secondary | ICD-10-CM

## 2024-07-01 NOTE — Assessment & Plan Note (Addendum)
 BP Readings from Last 3 Encounters:  07/01/24 118/78  05/13/24 (!) 143/84  05/13/24 128/62  Well-controlled hypertension Continue diltiazem  120 mg daily

## 2024-07-01 NOTE — Assessment & Plan Note (Signed)
 Stable.  Sees oncologist on a regular basis Continues Tarceva  150 mg daily Continues radiation treatment and chemotherapy Most recent oncology office visit note from last June reviewed

## 2024-07-01 NOTE — Patient Instructions (Signed)

## 2024-07-01 NOTE — Progress Notes (Signed)
 [name] 63 y.o.   Chief Complaint  Patient presents with   Follow-up    Patient here for 6 month f/u or HTN.    HISTORY OF PRESENT ILLNESS: This is a 63 y.o. female here for 58-month follow-up of hypertension Also has history of lung cancer.  Sees oncologist and pulmonary on a regular basis.  Doing well and responding well to treatment. Recent blood results from last June reviewed with patient.  Normal CBC and normal CMP. Using [employer] interpreter today. No other complaints or medical concerns today. BP Readings from Last 3 Encounters:  07/01/24 118/78  05/13/24 (!) 143/84  05/13/24 128/62     HPI   Prior to Admission medications   Medication Sig Start Date End Date Taking? Authorizing Provider  albuterol  (PROAIR  HFA) 108 (90 Base) MCG/ACT inhaler Inhale 1-2 puffs into the lungs every 6 (six) hours as needed for wheezing or shortness of breath. 06/19/23  Yes Melo Stauber, Emil Schanz, MD  chlorpheniramine-HYDROcodone  (TUSSIONEX) 10-8 MG/5ML Take 5 mLs by mouth every 12 (twelve) hours as needed for cough. 06/26/24  Yes Sherrod Sherrod, MD  clindamycin  (CLEOCIN  T) 1 % lotion Apply topically 2 (two) times daily. 05/13/24  Yes Sherrod Sherrod, MD  clindamycin  (CLEOCIN  T) 1 % lotion Apply topically 2 (two) times daily. 05/13/24  Yes Sherrod Sherrod, MD  diltiazem  (CARDIZEM ) 60 MG tablet Take 1 tablet (60 mg total) by mouth as needed (high heart rate). 04/29/24  Yes Ollis, Daphne CROME, NP  diltiazem  (TIADYLT  ER) 120 MG 24 hr capsule Take 1 capsule by mouth daily. 04/29/24  Yes Ollis, Daphne CROME, NP  doxycycline  (VIBRA -TABS) 100 MG tablet Take 1 tablet (100 mg total) by mouth 2 (two) times daily. 11/20/23  Yes Heilingoetter, Cassandra L, PA-C  erlotinib  (TARCEVA ) 150 MG tablet Take 1 tablet (150 mg total) by mouth daily. Take on an empty stomach 1 hour before meals or 2 hours after. 06/04/24 04/26/37 Yes Heilingoetter, Cassandra L, PA-C  prochlorperazine  (COMPAZINE ) 10 MG tablet Take 1 tablet (10 mg  total) by mouth every 6 (six) hours as needed. 11/20/23  Yes Heilingoetter, Cassandra L, PA-C  silver  sulfADIAZINE  (SILVADENE ) 1 % cream Apply topically daily. Cleanse foot wound with water, pat dry. Apply Silvadene  daily. Top with dry dressing 10/23/23  Yes Patsy Lenis, MD    Allergies  Allergen Reactions   Codeine  Nausea Only   Doxycycline  Nausea And Vomiting    vomiting and headache    Patient Active Problem List   Diagnosis Date Noted   Pulmonary nodule 09/05/2023   Lung mass 08/21/2023   Seasonal allergic rhinitis due to pollen 06/15/2017   Essential hypertension 05/23/2017   Atrial arrhythmia 05/21/2017   Port catheter in place 12/27/2016   Encounter for antineoplastic chemotherapy 06/13/2016   GERD (gastroesophageal reflux disease) 04/18/2016   Primary cancer of right lower lobe of lung (HCC) 05/06/2009   LEIOMYOMA, UTERUS 05/06/2009    Past Medical History:  Diagnosis Date   Drug-induced skin rash 02/27/2017   Encounter for antineoplastic chemotherapy 01/19/2016   History of radiation therapy 05/02/11 to 06/08/11   right lung   Lung cancer [facility])    right lower lobe adenocarcinoma    Past Surgical History:  Procedure Laterality Date   BRONCHIAL BIOPSY  09/05/2023   Procedure: BRONCHIAL BIOPSIES;  Surgeon: Shelah Lamar RAMAN, MD;  Location: [facility] ENDOSCOPY;  Service: Pulmonary;;   BRONCHIAL BRUSHINGS  09/05/2023   Procedure: BRONCHIAL BRUSHINGS;  Surgeon: Shelah Lamar RAMAN, MD;  Location: [facility] ENDOSCOPY;  Service: Pulmonary;;   BRONCHIAL NEEDLE ASPIRATION BIOPSY  09/05/2023   Procedure: BRONCHIAL NEEDLE ASPIRATION BIOPSIES;  Surgeon: Shelah Lamar RAMAN, MD;  Location: [facility] ENDOSCOPY;  Service: Pulmonary;;   FIDUCIAL MARKER PLACEMENT  09/05/2023   Procedure: FIDUCIAL MARKER PLACEMENT;  Surgeon: Shelah Lamar RAMAN, MD;  Location: [facility] ENDOSCOPY;  Service: Pulmonary;;   LUNG LOBECTOMY  04/18/2009   RLL   PORTACATH PLACEMENT  02/29/2012   Procedure: INSERTION PORT-A-CATH;  Surgeon: Nancyann SHAUNNA Nam, MD;  Location: [facility] OR;  Service: Thoracic;  Laterality: Left;  PowerPort 8 F attachable in left internal jugular.    Social History   Socioeconomic History   Marital status: Married    Spouse name: Not on file   Number of children: 7   Years of education: Not on file   Highest education level: Not on file  Occupational History   Occupation: stay at home mom  Tobacco Use   Smoking status: Never   Smokeless tobacco: Never  Vaping Use   Vaping status: Never Used  Substance and Sexual Activity   Alcohol use: No   Drug use: No   Sexual activity: Not Currently  Other Topics Concern   Not on file  Social History Narrative   09/01/2014   The patient is a 63 year old [place] woman.   The patient is married and has 7 children. One child passed away.   The patient came to the [place]  in approximately [place]. They were in [place]  for 10-11 years and then moved to [place], [place]  since then.   Patient does not smoke, drink, or use illicit drugs.   Patient has not used smokeless tobacco products.   Fun/Hobby: Go shopping    Social Drivers of Health   Financial Resource Strain: Low Risk  (05/13/2024)   Overall Financial Resource Strain (CARDIA)    Difficulty of Paying Living Expenses: Not hard at all  Food Insecurity: No Food Insecurity (05/13/2024)   Hunger Vital Sign    Worried About Running Out of Food in the Last Year: Never true    Ran Out of Food in the Last Year: Never true  Transportation Needs: No Transportation Needs (05/13/2024)   PRAPARE - Administrator, Civil Service (Medical): No    Lack of Transportation (Non-Medical): No  Physical Activity: Insufficiently Active (05/13/2024)   Exercise Vital Sign    Days of Exercise per Week: 7 days    Minutes of Exercise per Session: 20 min  Stress: No Stress Concern Present (05/13/2024)   [employer] of Occupational Health - Occupational Stress Questionnaire    Feeling of Stress: Not at all   Social Connections: Socially Integrated (05/13/2024)   Social Connection and Isolation Panel    Frequency of Communication with Friends and Family: More than three times a week    Frequency of Social Gatherings with Friends and Family: More than three times a week    Attends Religious Services: More than 4 times per year    Active Member of [employer] or Organizations: Yes    Attends Engineer, structural: More than 4 times per year    Marital Status: Married  Catering manager Violence: Not At Risk (05/13/2024)   Humiliation, Afraid, Rape, and Kick questionnaire    Fear of Current or Ex-Partner: No    Emotionally Abused: No    Physically Abused: No    Sexually Abused: No    Family History  Problem Relation Age of Onset   Heart Problems  Mother    Liver disease Neg Hx    Colon cancer Neg Hx    Esophageal cancer Neg Hx      Review of Systems  Constitutional: Negative.  Negative for chills and fever.  HENT: Negative.  Negative for congestion and sore throat.   Respiratory: Negative.  Negative for cough and shortness of breath.   Cardiovascular: Negative.  Negative for chest pain and palpitations.  Gastrointestinal:  Negative for abdominal pain, diarrhea, nausea and vomiting.  Genitourinary: Negative.  Negative for dysuria and hematuria.  Skin: Negative.  Negative for rash.  Neurological: Negative.  Negative for dizziness and headaches.  All other systems reviewed and are negative.   Vitals:   07/01/24 0930  BP: 118/78  Pulse: (!) 56  Temp: 98.3 F (36.8 C)  SpO2: 97%    Physical Exam Vitals reviewed.  Constitutional:      Appearance: Normal appearance.  HENT:     Head: Normocephalic.     Mouth/Throat:     Mouth: Mucous membranes are moist.     Pharynx: Oropharynx is clear.  Eyes:     Extraocular Movements: Extraocular movements intact.     Pupils: Pupils are equal, round, and reactive to light.  Cardiovascular:     Rate and Rhythm: Normal rate and regular  rhythm.     Pulses: Normal pulses.     Heart sounds: Murmur heard.  Pulmonary:     Effort: Pulmonary effort is normal.     Breath sounds: Normal breath sounds.  Abdominal:     Palpations: Abdomen is soft.     Tenderness: There is no abdominal tenderness.  Musculoskeletal:     Cervical back: No tenderness.  Lymphadenopathy:     Cervical: No cervical adenopathy.  Skin:    General: Skin is warm and dry.     Capillary Refill: Capillary refill takes less than 2 seconds.  Neurological:     General: No focal deficit present.     Mental Status: She is alert and oriented to person, place, and time.  Psychiatric:        Mood and Affect: Mood normal.        Behavior: Behavior normal.      ASSESSMENT & PLAN: A total of 42 minutes was spent with the patient and counseling/coordination of care regarding preparing for this visit, review of most recent office visit notes, review of multiple chronic medical conditions and their management, review of all medications, review of most recent bloodwork results, review of health maintenance items, education on nutrition, prognosis, documentation, and need for follow up.   Problem List Items Addressed This Visit       Cardiovascular and Mediastinum   Essential hypertension - Primary   BP Readings from Last 3 Encounters:  07/01/24 118/78  05/13/24 (!) 143/84  05/13/24 128/62  Well-controlled hypertension Continue diltiazem  120 mg daily        Respiratory   Primary cancer of right lower lobe of lung (HCC)   Stable.  Sees oncologist on a regular basis Continues Tarceva  150 mg daily Continues radiation treatment and chemotherapy Most recent oncology office visit note from last June reviewed      Patient Instructions  Health Maintenance, Female Adopting a healthy lifestyle and getting preventive care are important in promoting health and wellness. Ask your health care provider about: The right schedule for you to have regular tests and  exams. Things you can do on your own to prevent diseases and keep yourself healthy. What  should I know about diet, weight, and exercise? Eat a healthy diet  Eat a diet that includes plenty of vegetables, fruits, low-fat dairy products, and lean protein. Do not eat a lot of foods that are high in solid fats, added sugars, or sodium. Maintain a healthy weight Body mass index (BMI) is used to identify weight problems. It estimates body fat based on height and weight. Your health care provider can help determine your BMI and help you achieve or maintain a healthy weight. Get regular exercise Get regular exercise. This is one of the most important things you can do for your health. Most adults should: Exercise for at least 150 minutes each week. The exercise should increase your heart rate and make you sweat (moderate-intensity exercise). Do strengthening exercises at least twice a week. This is in addition to the moderate-intensity exercise. Spend less time sitting. Even light physical activity can be beneficial. Watch cholesterol and blood lipids Have your blood tested for lipids and cholesterol at 62 years of age, then have this test every 5 years. Have your cholesterol levels checked more often if: Your lipid or cholesterol levels are high. You are older than 62 years of age. You are at high risk for heart disease. What should I know about cancer screening? Depending on your health history and family history, you may need to have cancer screening at various ages. This may include screening for: Breast cancer. Cervical cancer. Colorectal cancer. Skin cancer. Lung cancer. What should I know about heart disease, diabetes, and high blood pressure? Blood pressure and heart disease High blood pressure causes heart disease and increases the risk of stroke. This is more likely to develop in people who have high blood pressure readings or are overweight. Have your blood pressure checked: Every  3-5 years if you are 32-42 years of age. Every year if you are 62 years old or older. Diabetes Have regular diabetes screenings. This checks your fasting blood sugar level. Have the screening done: Once every three years after age 59 if you are at a normal weight and have a low risk for diabetes. More often and at a younger age if you are overweight or have a high risk for diabetes. What should I know about preventing infection? Hepatitis B If you have a higher risk for hepatitis B, you should be screened for this virus. Talk with your health care provider to find out if you are at risk for hepatitis B infection. Hepatitis C Testing is recommended for: Everyone born from [birth year] through 1964. Anyone with known risk factors for hepatitis C. Sexually transmitted infections (STIs) Get screened for STIs, including gonorrhea and chlamydia, if: You are sexually active and are younger than 62 years of age. You are older than 62 years of age and your health care provider tells you that you are at risk for this type of infection. Your sexual activity has changed since you were last screened, and you are at increased risk for chlamydia or gonorrhea. Ask your health care provider if you are at risk. Ask your health care provider about whether you are at high risk for HIV. Your health care provider may recommend a prescription medicine to help prevent HIV infection. If you choose to take medicine to prevent HIV, you should first get tested for HIV. You should then be tested every 3 months for as long as you are taking the medicine. Pregnancy If you are about to stop having your period (premenopausal) and you may become pregnant,  seek counseling before you get pregnant. Take 400 to 800 micrograms (mcg) of folic acid  every day if you become pregnant. Ask for birth control (contraception) if you want to prevent pregnancy. Osteoporosis and menopause Osteoporosis is a disease in which the bones lose minerals and  strength with aging. This can result in bone fractures. If you are 47 years old or older, or if you are at risk for osteoporosis and fractures, ask your health care provider if you should: Be screened for bone loss. Take a calcium  or vitamin D supplement to lower your risk of fractures. Be given hormone replacement therapy (HRT) to treat symptoms of menopause. Follow these instructions at home: Alcohol use Do not drink alcohol if: Your health care provider tells you not to drink. You are pregnant, may be pregnant, or are planning to become pregnant. If you drink alcohol: Limit how much you have to: 0-1 drink a day. Know how much alcohol is in your drink. In the [place] one drink equals one 12 oz bottle of beer (355 mL), one 5 oz glass of wine (148 mL), or one 1 oz glass of hard liquor (44 mL). Lifestyle Do not use any products that contain nicotine or tobacco. These products include cigarettes, chewing tobacco, and vaping devices, such as e-cigarettes. If you need help quitting, ask your health care provider. Do not use street drugs. Do not share needles. Ask your health care provider for help if you need support or information about quitting drugs. General instructions Schedule regular health, dental, and eye exams. Stay current with your vaccines. Tell your health care provider if: You often feel depressed. You have ever been abused or do not feel safe at home. Summary Adopting a healthy lifestyle and getting preventive care are important in promoting health and wellness. Follow your health care provider's instructions about healthy diet, exercising, and getting tested or screened for diseases. Follow your health care provider's instructions on monitoring your cholesterol and blood pressure. This information is not intended to replace advice given to you by your health care provider. Make sure you discuss any questions you have with your health care provider. Document Revised:  04/05/2021 Document Reviewed: 04/05/2021 Elsevier Patient Education  2024 Elsevier Inc.     Emil Schaumann, MD [facility]

## 2024-07-03 ENCOUNTER — Telehealth: Payer: Self-pay

## 2024-07-03 ENCOUNTER — Other Ambulatory Visit (HOSPITAL_COMMUNITY): Payer: Self-pay

## 2024-07-03 NOTE — Telephone Encounter (Signed)
 Oral Oncology Patient Advocate Encounter   [place] was successful in securing patient a grant from [employer] to provide copayment coverage for Terceva.  This will keep the out of pocket expense at $0.    [employer]  ID: [id number]     It is conditional, they wanted more documentation. I called to verify if they received it, but they could not read the income verification, so I resent it via email so it would keep the photo visible.  Will followup.  Lucie Lamer, CPhT [place]  [facility] Pharmacy Technician Patient Advocate Specialist II [facility] [place] Phone: [phone]  Fax: [phone] [email or web address]

## 2024-07-08 NOTE — Telephone Encounter (Signed)
 Oral Oncology Patient Advocate Encounter  I called and spoke to Son Friday, we needed more information for benefits: if she is married (yes) and joint income. Son is going to email me info once he was able to speak with parents if marriage is legal in [place]  (got married in [place]) and how his dad filed taxes. He is going to email me back info when he gets it.  Lucie Lamer, CPhT [place]  [facility] Pharmacy Technician Patient Advocate Specialist II [facility] [place] Phone: [phone]  Fax: [phone] [email or web address]

## 2024-07-09 NOTE — Telephone Encounter (Signed)
 Oral Oncology Patient Advocate Encounter  I received the W2 form from the son, and I have emailed all of the information to [employer].Will check in with them later for update.  Lucie Lamer, CPhT [place]  [facility] Pharmacy Technician Patient Advocate Specialist II [facility] [place] Phone: [phone]  Fax: [phone] [email or web address]

## 2024-07-12 NOTE — Telephone Encounter (Signed)
 Oral Oncology Patient Advocate Encounter  I emailed Ra back Thursday 07/11/24: I submitted over the W2 for your dad Tuesday morning, and now they want more information. They want to know if your mother files taxes, if not what all is submitted is good! If she does file taxes, even jointly with your dad, they want documentation of that tax form.  I know it is a lot of information they need, and if it is not something your parents are comfortable with, I completely understand. She can still of course get the medication through [facility] since they have it available and it is so affordable. Whichever way you all would like to go, just let me know!  I have not heard back from him. [employer] will reach out to the patient once they get to the application, I was trying to get the info myself and send it before it got to the review part. I will update if any other information comes in.  Lucie Lamer, CPhT   [facility] Pharmacy Technician Patient Advocate Specialist II [facility] [place] Phone: [phone]  Fax: [phone] [email or web address]

## 2024-08-05 ENCOUNTER — Inpatient Hospital Stay: Attending: Internal Medicine

## 2024-08-05 ENCOUNTER — Ambulatory Visit (HOSPITAL_COMMUNITY)
Admission: RE | Admit: 2024-08-05 | Discharge: 2024-08-05 | Disposition: A | Discharge status: Home / Self Care | Source: Ambulatory Visit | Attending: Internal Medicine | Admitting: Internal Medicine

## 2024-08-05 ENCOUNTER — Inpatient Hospital Stay

## 2024-08-05 DIAGNOSIS — C3490 Malignant neoplasm of unspecified part of unspecified bronchus or lung: Secondary | ICD-10-CM | POA: Diagnosis not present

## 2024-08-05 DIAGNOSIS — J90 Pleural effusion, not elsewhere classified: Secondary | ICD-10-CM | POA: Diagnosis not present

## 2024-08-05 DIAGNOSIS — I700 Atherosclerosis of aorta: Secondary | ICD-10-CM | POA: Diagnosis not present

## 2024-08-05 DIAGNOSIS — C3431 Malignant neoplasm of lower lobe, right bronchus or lung: Secondary | ICD-10-CM | POA: Diagnosis not present

## 2024-08-05 DIAGNOSIS — J984 Other disorders of lung: Secondary | ICD-10-CM | POA: Diagnosis not present

## 2024-08-05 DIAGNOSIS — Z95828 Presence of other vascular implants and grafts: Secondary | ICD-10-CM

## 2024-08-05 LAB — CBC WITH DIFFERENTIAL (CANCER CENTER ONLY)
Abs Immature Granulocytes: 0.01 K/uL (ref 0.00–0.07)
Basophils Absolute: 0 K/uL (ref 0.0–0.1)
Basophils Relative: 1 %
Eosinophils Absolute: 0 K/uL (ref 0.0–0.5)
Eosinophils Relative: 1 %
HCT: 37.7 % (ref 36.0–46.0)
Hemoglobin: 12.2 g/dL (ref 12.0–15.0)
Immature Granulocytes: 0 %
Lymphocytes Relative: 30 %
Lymphs Abs: 1.8 K/uL (ref 0.7–4.0)
MCH: 27.6 pg (ref 26.0–34.0)
MCHC: 32.4 g/dL (ref 30.0–36.0)
MCV: 85.3 fL (ref 80.0–100.0)
Monocytes Absolute: 0.5 K/uL (ref 0.1–1.0)
Monocytes Relative: 8 %
Neutro Abs: 3.7 K/uL (ref 1.7–7.7)
Neutrophils Relative %: 60 %
Platelet Count: 208 K/uL (ref 150–400)
RBC: 4.42 MIL/uL (ref 3.87–5.11)
RDW: 14.9 % (ref 11.5–15.5)
WBC Count: 6.1 K/uL (ref 4.0–10.5)
nRBC: 0 % (ref 0.0–0.2)

## 2024-08-05 LAB — CMP (CANCER CENTER ONLY)
ALT: 23 U/L (ref 0–44)
AST: 29 U/L (ref 15–41)
Albumin: 4.1 g/dL (ref 3.5–5.0)
Alkaline Phosphatase: 80 U/L (ref 38–126)
Anion gap: 4 — ABNORMAL LOW (ref 5–15)
BUN: 12 mg/dL (ref 8–23)
CO2: 33 mmol/L — ABNORMAL HIGH (ref 22–32)
Calcium: 8.5 mg/dL — ABNORMAL LOW (ref 8.9–10.3)
Chloride: 105 mmol/L (ref 98–111)
Creatinine: 0.4 mg/dL — ABNORMAL LOW (ref 0.44–1.00)
GFR, Estimated: >60 mL/min (ref >60)
Glucose, Bld: 77 mg/dL (ref 70–99)
Potassium: 3.9 mmol/L (ref 3.5–5.1)
Sodium: 142 mmol/L (ref 135–145)
Total Bilirubin: 0.9 mg/dL (ref 0.0–1.2)
Total Protein: 7.3 g/dL (ref 6.5–8.1)

## 2024-08-05 MED ORDER — HEPARIN SOD (PORK) LOCK FLUSH 100 UNIT/ML IV SOLN: 500.0000 [IU] | Freq: Once | INTRAVENOUS | Status: DC

## 2024-08-05 MED ORDER — IOHEXOL 300 MG/ML  SOLN
100.0000 mL | Freq: Once | INTRAMUSCULAR | Status: AC | PRN
  Administered 2024-08-05: 75 mL via INTRAVENOUS

## 2024-08-05 MED ORDER — SODIUM CHLORIDE 0.9% FLUSH
10.0000 mL | INTRAVENOUS | Status: DC | PRN
  Administered 2024-08-05: 10 mL via INTRAVENOUS

## 2024-08-05 MED ORDER — HEPARIN SOD (PORK) LOCK FLUSH 100 UNIT/ML IV SOLN
INTRAVENOUS | Status: AC
  Filled 2024-08-05: qty 5

## 2024-08-12 ENCOUNTER — Inpatient Hospital Stay (HOSPITAL_BASED_OUTPATIENT_CLINIC_OR_DEPARTMENT_OTHER): Admitting: Internal Medicine

## 2024-08-12 VITALS — BP 147/76 | HR 48 | Temp 97.6°F | Resp 17 | Ht <= 58 in | Wt 111.2 lb

## 2024-08-12 DIAGNOSIS — R059 Cough, unspecified: Secondary | ICD-10-CM

## 2024-08-12 DIAGNOSIS — C3431 Malignant neoplasm of lower lobe, right bronchus or lung: Secondary | ICD-10-CM | POA: Diagnosis not present

## 2024-08-12 DIAGNOSIS — C3490 Malignant neoplasm of unspecified part of unspecified bronchus or lung: Secondary | ICD-10-CM

## 2024-08-12 MED ORDER — HYDROCOD POLI-CHLORPHE POLI ER 10-8 MG/5ML PO SUER: 5.0000 mL | Freq: Two times a day (BID) | ORAL | 0 refills | Status: DC | PRN

## 2024-08-12 MED ORDER — DOXYCYCLINE HYCLATE 100 MG PO TABS: 100.0000 mg | ORAL_TABLET | Freq: Two times a day (BID) | ORAL | 0 refills | Status: DC

## 2024-08-12 NOTE — Progress Notes (Signed)
 #        [facility] [phone]   [phone]  OFFICE PROGRESS NOTE  Purcell Emil Schanz, MD [address] [place] [address]  PRINCIPAL DIAGNOSIS: Local recurrence of non-small cell lung cancer, adenocarcinoma, initially diagnosed as stage IB (T2a N0 M0) in March of 2010.   PRIOR THERAPY:  Status post right lower lobectomy under the care of Dr. Lucas on 04/08/2009. Status post palliative radiotherapy to the right lower lobe recurrent lung mass under the care of Dr. Dewey, completed on June 08, 2011. Systemic chemotherapy with carboplatin  for AUC of 5 and Alimta  500 mg/M2 every 3 weeks. She is status post 3 cycles. SBRT to the recurrent right lung tumor under the care of Dr. Patrcia, last dose on 10/30/23.   CURRENT THERAPY: Tarceva  150 mg by mouth daily, therapy beginning 07/24/2012. Status post approximately 145 months of therapy.   INTERVAL HISTORY: 63 years old Asian female returns to the clinic today for follow-up visit accompanied by her interpreter.Discussed the use of AI scribe software for clinical note transcription with the patient, who gave verbal consent to proceed.  History of Present Illness [name] is a 63 year old female with recurrent non-small cell lung cancer who presents for evaluation with a repeat CT scan of the chest for restaging of her disease. She is accompanied by her daughter, who also serves as her interpreter.  She has a history of recurrent non-small cell lung cancer, initially diagnosed as stage 1B in March 2010. She has been on Tarceva  (erlotinib ) 150 mg orally once daily since August 2013, totaling 145 months of treatment.  She reports a new symptom of hemoptysis without associated phlegm. She confirms coughing up blood.  She experiences sleep disturbances, sleeping from 4 AM to 9 AM, and does not sleep during the day. She denies feeling sick but reports not sleeping well at night.  She reports  intermittent diarrhea, occurring every other day, with no significant change in frequency or severity.  She recently traveled to [place] for four weeks, returning on Apr 23, 2024.     MEDICAL HISTORY: Past Medical History:  Diagnosis Date   Drug-induced skin rash 02/27/2017   Encounter for antineoplastic chemotherapy 01/19/2016   History of radiation therapy 05/02/11 to 06/08/11   right lung   Lung cancer (HCC)    right lower lobe adenocarcinoma    ALLERGIES:  is allergic to codeine  and doxycycline .  MEDICATIONS:  Current Outpatient Medications  Medication Sig Dispense Refill   albuterol  (PROAIR  HFA) 108 (90 Base) MCG/ACT inhaler Inhale 1-2 puffs into the lungs every 6 (six) hours as needed for wheezing or shortness of breath. 6.7 g 2   chlorpheniramine-HYDROcodone  (TUSSIONEX) 10-8 MG/5ML Take 5 mLs by mouth every 12 (twelve) hours as needed for cough. 473 mL 0   clindamycin  (CLEOCIN  T) 1 % lotion Apply topically 2 (two) times daily. 60 mL 0   clindamycin  (CLEOCIN  T) 1 % lotion Apply topically 2 (two) times daily. 60 mL 0   diltiazem  (CARDIZEM ) 60 MG tablet Take 1 tablet (60 mg total) by mouth as needed (high heart rate). 60 tablet 3   diltiazem  (TIADYLT  ER) 120 MG 24 hr capsule Take 1 capsule by mouth daily. 90 capsule 3   doxycycline  (VIBRA -TABS) 100 MG tablet Take 1 tablet (100 mg total) by mouth 2 (two) times daily. 20 tablet 0   erlotinib  (TARCEVA ) 150 MG tablet Take 1 tablet (150 mg total) by mouth daily. Take  on an empty stomach 1 hour before meals or 2 hours after. 30 tablet 2   prochlorperazine  (COMPAZINE ) 10 MG tablet Take 1 tablet (10 mg total) by mouth every 6 (six) hours as needed. 30 tablet 2   silver  sulfADIAZINE  (SILVADENE ) 1 % cream Apply topically daily. Cleanse foot wound with water, pat dry. Apply Silvadene  daily. Top with dry dressing     No current facility-administered medications for this visit.    SURGICAL HISTORY:  Past Surgical History:  Procedure  Laterality Date   BRONCHIAL BIOPSY  09/05/2023   Procedure: BRONCHIAL BIOPSIES;  Surgeon: Kathryn Lamar RAMAN, MD;  Location: [facility] ENDOSCOPY;  Service: Pulmonary;;   BRONCHIAL BRUSHINGS  09/05/2023   Procedure: BRONCHIAL BRUSHINGS;  Surgeon: Kathryn Lamar RAMAN, MD;  Location: [facility] ENDOSCOPY;  Service: Pulmonary;;   BRONCHIAL NEEDLE ASPIRATION BIOPSY  09/05/2023   Procedure: BRONCHIAL NEEDLE ASPIRATION BIOPSIES;  Surgeon: Kathryn Lamar RAMAN, MD;  Location: [facility] ENDOSCOPY;  Service: Pulmonary;;   FIDUCIAL MARKER PLACEMENT  09/05/2023   Procedure: FIDUCIAL MARKER PLACEMENT;  Surgeon: Kathryn Lamar RAMAN, MD;  Location: [facility] ENDOSCOPY;  Service: Pulmonary;;   LUNG LOBECTOMY  04/18/2009   RLL   PORTACATH PLACEMENT  02/29/2012   Procedure: INSERTION PORT-A-CATH;  Surgeon: Kathryn SHAUNNA Nam, MD;  Location: [facility] OR;  Service: Thoracic;  Laterality: Left;  PowerPort 8 F attachable in left internal jugular.    REVIEW OF SYSTEMS:  Constitutional: positive for fatigue Eyes: negative Ears, nose, mouth, throat, and face: negative Respiratory: positive for cough and hemoptysis Cardiovascular: negative Gastrointestinal: negative Genitourinary:negative Integument/breast: positive for rash Hematologic/lymphatic: negative Musculoskeletal:negative Neurological: negative Behavioral/Psych: negative Endocrine: negative Allergic/Immunologic: negative   PHYSICAL EXAMINATION: General appearance: alert, cooperative, appears stated age, fatigued, and no distress Head: Normocephalic, without obvious abnormality, atraumatic Neck: no adenopathy, no JVD, supple, symmetrical, trachea midline, and thyroid  not enlarged, symmetric, no tenderness/mass/nodules Lymph nodes: Cervical, supraclavicular, and axillary nodes normal. Resp: clear to auscultation bilaterally Back: symmetric, no curvature. ROM normal. No CVA tenderness. Cardio: regular rate and rhythm, S1, S2 normal, no murmur, click, rub or gallop GI: soft, non-tender; bowel sounds normal; no  masses,  no organomegaly Extremities: extremities normal, atraumatic, no cyanosis or edema Neurologic: Alert and oriented X 3, normal strength and tone. Normal symmetric reflexes. Normal coordination and gait  ECOG PERFORMANCE STATUS: 1 - Symptomatic but completely ambulatory  Blood pressure (!) 147/76, pulse (!) 48, temperature 97.6 F (36.4 C), resp. rate 17, height 4' 10 (1.473 m), weight 111 lb 3.2 oz (50.4 kg), last menstrual period 02/10/2012, SpO2 99%.  LABORATORY DATA: Lab Results  Component Value Date   WBC 6.1 08/05/2024   HGB 12.2 08/05/2024   HCT 37.7 08/05/2024   MCV 85.3 08/05/2024   PLT 208 08/05/2024      Chemistry      Component Value Date/Time   NA 142 08/05/2024 1301   NA 143 10/30/2017 1031   K 3.9 08/05/2024 1301   K 4.3 10/30/2017 1031   CL 105 08/05/2024 1301   CL 102 04/30/2013 1002   CO2 33 (H) 08/05/2024 1301   CO2 29 10/30/2017 1031   BUN 12 08/05/2024 1301   BUN 14.6 10/30/2017 1031   CREATININE 0.40 (L) 08/05/2024 1301   CREATININE 0.7 10/30/2017 1031      Component Value Date/Time   CALCIUM  8.5 (L) 08/05/2024 1301   CALCIUM  9.2 10/30/2017 1031   ALKPHOS 80 08/05/2024 1301   ALKPHOS 103 10/30/2017 1031   AST 29 08/05/2024 1301   AST  20 10/30/2017 1031   ALT 23 08/05/2024 1301   ALT 14 10/30/2017 1031   BILITOT 0.9 08/05/2024 1301   BILITOT 0.79 10/30/2017 1031       RADIOGRAPHIC STUDIES: CT Chest W Contrast Result Date: 08/08/2024 CLINICAL DATA:  Non-small cell lung cancer follow-up. * Tracking Code: BO * EXAM: CT CHEST WITH CONTRAST TECHNIQUE: Multidetector CT imaging of the chest was performed during intravenous contrast administration. RADIATION DOSE REDUCTION: This exam was performed according to the departmental dose-optimization program which includes automated exposure control, adjustment of the mA and/or kV according to patient size and/or use of iterative reconstruction technique. CONTRAST:  75mL OMNIPAQUE  IOHEXOL  300 MG/ML   SOLN COMPARISON:  Multiple priors including CT January 01, 2024 FINDINGS: Cardiovascular: Accessed left chest Port-A-Cath with the tip near the superior cavoatrial junction. Aortic atherosclerosis. Enlarged main pulmonary artery. Normal size heart. No significant pericardial effusion/thickening. Mediastinum/Nodes: No suspicious thyroid  nodule. Similar rightward deviation of the mediastinum. Right cardiophrenic lymph node measures 9 mm in short axis on image 33/2, unchanged. Lungs/Pleura: Surgical changes of prior right upper lobectomy. Slight increase in size of the small right partially loculated pleural effusion. Increased consolidation and ground-glass in the posteroinferior right upper lobe with the mixed attenuation right perihilar component measuring 8.1 x 4.5 cm on image 67/7 previously 7.5 x 6.1 cm. New dense consolidation about the right lower lobe mixed attenuation nodule with fiducial marker in place on image 75/7 common no longer discretely measurable on image 75/7. Additional previously indexed pulmonary nodules are obscured by consolidation. Upper Abdomen: No acute abnormality. Musculoskeletal: No aggressive lytic or blastic lesion of bone. Similar probable radiation induced irregularity of the posterior upper right ribs mostly sixth and seventh. Remote left clavicular fracture. IMPRESSION: 1. Increased consolidation and ground-glass in the posteroinferior right upper lobe with the mixed attenuation right perihilar component measuring 8.1 x 4.5 cm previously 7.5 x 6.1 cm. 2. New dense consolidation about the right lower lobe mixed attenuation nodule with fiducial marker in place, no longer discretely measurable. 3. Slight increase in size of the small right partially loculated pleural effusion. 4. Additional previously indexed pulmonary nodules are obscured by consolidation. 5. Aortic atherosclerosis. Aortic Atherosclerosis (ICD10-I70.0). Electronically Signed   By: [name] M.D.   On: 08/08/2024  15:04      ASSESSMENT AND PLAN:  This is a very pleasant 63 years old Asian female with recurrent non-small cell lung cancer, adenocarcinoma status post surgical resection followed by systemic chemotherapy with carboplatin  and Alimta  followed by disease progression and the patient was started on treatment with Tarceva  150 mg by mouth daily status post 139 months  The patient was referred to Dr. Shelah and she underwent bronchoscopy that showed recurrent adenocarcinoma. She was treated with SBRT to the right upper lobe lung mass under the care of Dr. Patrcia. The patient has been tolerating her treatment with Tarceva  fairly well. She had repeat CT scan of the chest performed recently.  I personally independently reviewed the scan images and discussed the result with the patient today.  Her scan showed increased consolidation and ground glass in the posterior inferior right upper lobe with mixed attenuation right perihilar component measuring 8.1 x 4.5 cm compared to 7.5 x 6.1 cm. Assessment and Plan Assessment & Plan Recurrent non-small cell lung cancer of the right lung Initially diagnosed as stage 1B in March 2010. Currently on Tarceva  150 mg PO daily since August 2013. Recent CT scan shows increased density on the right side, indicating possible  inflammation or cancer progression. - Continue Tarceva  150 mg PO daily - Order PET scan in 1-2 weeks to evaluate increased density in the right lung - Follow-up appointment in 3-4 weeks to discuss PET scan results  Hemoptysis Coughing up blood, concerning for potential cancer progression or inflammation in the lung. - Prescribe doxycycline  for 1 week to address possible inflammation  Diarrhea Intermittent diarrhea, occurring every other day.  Rash Persistent rash. She was advised to call immediately if she has any other concerning symptoms in the interval.  All questions were answered. The patient knows to call the clinic with any problems,  questions or concerns. We can certainly see the patient much sooner if necessary.  The total time spent in the appointment was 30 minutes.  Disclaimer: This note was dictated with voice recognition software. Similar sounding words can inadvertently be transcribed and may not be corrected upon review.

## 2024-08-21 ENCOUNTER — Other Ambulatory Visit: Payer: Self-pay | Admitting: *Deleted

## 2024-08-21 DIAGNOSIS — C3490 Malignant neoplasm of unspecified part of unspecified bronchus or lung: Secondary | ICD-10-CM

## 2024-08-21 MED ORDER — ERLOTINIB HCL 150 MG PO TABS: 150.0000 mg | ORAL_TABLET | Freq: Every day | ORAL | 2 refills | Status: DC

## 2024-08-26 ENCOUNTER — Ambulatory Visit (HOSPITAL_COMMUNITY)
Admission: RE | Admit: 2024-08-26 | Discharge: 2024-08-26 | Disposition: A | Discharge status: Home / Self Care | Source: Ambulatory Visit | Attending: Internal Medicine | Admitting: Internal Medicine

## 2024-08-26 DIAGNOSIS — C3411 Malignant neoplasm of upper lobe, right bronchus or lung: Secondary | ICD-10-CM | POA: Insufficient documentation

## 2024-08-26 DIAGNOSIS — C3490 Malignant neoplasm of unspecified part of unspecified bronchus or lung: Secondary | ICD-10-CM | POA: Insufficient documentation

## 2024-08-26 DIAGNOSIS — I517 Cardiomegaly: Secondary | ICD-10-CM | POA: Insufficient documentation

## 2024-08-26 DIAGNOSIS — I700 Atherosclerosis of aorta: Secondary | ICD-10-CM | POA: Insufficient documentation

## 2024-08-26 DIAGNOSIS — Z483 Aftercare following surgery for neoplasm: Secondary | ICD-10-CM | POA: Diagnosis not present

## 2024-08-26 DIAGNOSIS — R918 Other nonspecific abnormal finding of lung field: Secondary | ICD-10-CM | POA: Diagnosis not present

## 2024-08-26 DIAGNOSIS — E041 Nontoxic single thyroid nodule: Secondary | ICD-10-CM | POA: Insufficient documentation

## 2024-08-26 LAB — GLUCOSE, CAPILLARY: Glucose-Capillary: 101 mg/dL — ABNORMAL HIGH (ref 70–99)

## 2024-08-26 MED ORDER — FLUDEOXYGLUCOSE F - 18 (FDG) INJECTION
5.5500 | Freq: Once | INTRAVENOUS | Status: AC
Start: 2024-08-26 — End: 2024-08-26
  Administered 2024-08-26: 5.5 via INTRAVENOUS

## 2024-08-27 NOTE — Progress Notes (Signed)
 [facility] OFFICE PROGRESS NOTE  Purcell Emil Schanz, MD [address] [place] [address]  DIAGNOSIS: Local recurrence of non-small cell lung cancer, adenocarcinoma, initially diagnosed as stage IB (T2a N0 M0) in March of 2010.   PRIOR THERAPY: Status post right lower lobectomy under the care of Dr. Lucas on 04/08/2009. Status post palliative radiotherapy to the right lower lobe recurrent lung mass under the care of Dr. Dewey, completed on June 08, 2011. Systemic chemotherapy with carboplatin  for AUC of 5 and Alimta  500 mg/M2 every 3 weeks. She is status post 3 cycles. SBRT to the recurrent right lung tumor under the care of Dr. Patrcia, last dose on 10/30/23.   CURRENT THERAPY: Tarceva  150 mg by mouth daily, therapy beginning 07/24/2012. Status post approximately 145 months of therapy.   INTERVAL HISTORY: Kariss Flanigan 63 y.o. female returns to the clinic today for a follow-up visit her daughter is available by phone.  The patient was last seen by Dr. Sherrod on 08/12/2024.  The patient has been on Tarceva  since 2013.  At her last appointment she had a restaging CT scan which showed Increased consolidation and ground-glass in the posteroinferior right upper lobe and  consolidation about the right lower lobe mixed attenuation nodule.  Therefore, Dr. Sherrod ordered a PET scan to further evaluate this.  She was given antibiotics at her last appointment with doxycycline .  She reports that she is not having as much Hemoptysis as she was at her last appointment.  She may sometimes have pink sputum but no bright red blood.  She also reports that she feels better and she is not coughing as much.  She denies any fever.  She reports decreased appetite and sleep disturbances/insomnia.  She did lose weight since she was last seen.  She denies any chest pain or shortness of breath.  She denies any significant nausea or vomiting.  She may intermittently have diarrhea from Tarceva  but  denies anything out of the ordinary.  She sometimes has intermittent rash for which she uses clindamycin  lotion.  She does have a mild rash on her head.  She recently had a PET scan.  She is here today for evaluation and to review her PET scan results.   MEDICAL HISTORY: Past Medical History:  Diagnosis Date   Drug-induced skin rash 02/27/2017   Encounter for antineoplastic chemotherapy 01/19/2016   History of radiation therapy 05/02/11 to 06/08/11   right lung   Lung cancer (HCC)    right lower lobe adenocarcinoma    ALLERGIES:  is allergic to codeine  and doxycycline .  MEDICATIONS:  Current Outpatient Medications  Medication Sig Dispense Refill   mirtazapine (REMERON) 7.5 MG tablet Take 1 tablet (7.5 mg total) by mouth at bedtime. 30 tablet 2   albuterol  (PROAIR  HFA) 108 (90 Base) MCG/ACT inhaler Inhale 1-2 puffs into the lungs every 6 (six) hours as needed for wheezing or shortness of breath. 6.7 g 2   chlorpheniramine-HYDROcodone  (TUSSIONEX) 10-8 MG/5ML Take 5 mLs by mouth every 12 (twelve) hours as needed for cough. 473 mL 0   clindamycin  (CLEOCIN  T) 1 % lotion Apply topically 2 (two) times daily. 60 mL 0   clindamycin  (CLEOCIN  T) 1 % lotion Apply topically 2 (two) times daily. 60 mL 0   diltiazem  (CARDIZEM ) 60 MG tablet Take 1 tablet (60 mg total) by mouth as needed (high heart rate). 60 tablet 3   diltiazem  (TIADYLT  ER) 120 MG 24 hr capsule Take 1 capsule by mouth  daily. 90 capsule 3   doxycycline  (VIBRA -TABS) 100 MG tablet Take 1 tablet (100 mg total) by mouth 2 (two) times daily. 20 tablet 0   doxycycline  (VIBRA -TABS) 100 MG tablet Take 1 tablet (100 mg total) by mouth 2 (two) times daily. 14 tablet 0   erlotinib  (TARCEVA ) 150 MG tablet Take 1 tablet (150 mg total) by mouth daily. Take on an empty stomach 1 hour before meals or 2 hours after. 30 tablet 2   prochlorperazine  (COMPAZINE ) 10 MG tablet Take 1 tablet (10 mg total) by mouth every 6 (six) hours as needed. 30 tablet 2   silver   sulfADIAZINE  (SILVADENE ) 1 % cream Apply topically daily. Cleanse foot wound with water, pat dry. Apply Silvadene  daily. Top with dry dressing     No current facility-administered medications for this visit.    SURGICAL HISTORY:  Past Surgical History:  Procedure Laterality Date   BRONCHIAL BIOPSY  09/05/2023   Procedure: BRONCHIAL BIOPSIES;  Surgeon: Shelah Lamar RAMAN, MD;  Location: [facility] ENDOSCOPY;  Service: Pulmonary;;   BRONCHIAL BRUSHINGS  09/05/2023   Procedure: BRONCHIAL BRUSHINGS;  Surgeon: Shelah Lamar RAMAN, MD;  Location: [facility] ENDOSCOPY;  Service: Pulmonary;;   BRONCHIAL NEEDLE ASPIRATION BIOPSY  09/05/2023   Procedure: BRONCHIAL NEEDLE ASPIRATION BIOPSIES;  Surgeon: Shelah Lamar RAMAN, MD;  Location: [facility] ENDOSCOPY;  Service: Pulmonary;;   FIDUCIAL MARKER PLACEMENT  09/05/2023   Procedure: FIDUCIAL MARKER PLACEMENT;  Surgeon: Shelah Lamar RAMAN, MD;  Location: [facility] ENDOSCOPY;  Service: Pulmonary;;   LUNG LOBECTOMY  04/18/2009   RLL   PORTACATH PLACEMENT  02/29/2012   Procedure: INSERTION PORT-A-CATH;  Surgeon: Nancyann SHAUNNA Nam, MD;  Location: [facility] OR;  Service: Thoracic;  Laterality: Left;  PowerPort 8 F attachable in left internal jugular.    REVIEW OF SYSTEMS:   Review of Systems  Constitutional: Negative for appetite change, chills, fatigue, fever and unexpected weight change.  HENT: Negative for mouth sores, nosebleeds, sore throat and trouble swallowing.   Eyes: Negative for eye problems and icterus.  Respiratory: Improved cough. Negative for hemoptysis, shortness of breath and wheezing.   Cardiovascular: Negative for chest pain and leg swelling.  Gastrointestinal: Negative for abdominal pain, constipation, diarrhea, nausea and vomiting.  Genitourinary: Negative for bladder incontinence, difficulty urinating, dysuria, frequency and hematuria.   Musculoskeletal: Negative for back pain, gait problem, neck pain and neck stiffness.  Skin: Occasional rash and itching.  Neurological: Negative for  dizziness, extremity weakness, gait problem, headaches, light-headedness and seizures.  Hematological: Negative for adenopathy. Does not bruise/bleed easily.  Psychiatric/Behavioral: Negative for confusion, depression and sleep disturbance. The patient is not nervous/anxious.     PHYSICAL EXAMINATION:  Blood pressure (!) 140/79, pulse 88, temperature 98 F (36.7 C), temperature source Temporal, resp. rate 16, height 4' 10 (1.473 m), weight 106 lb (48.1 kg), last menstrual period 02/10/2012, SpO2 100%.  ECOG PERFORMANCE STATUS: 1  Physical Exam  Constitutional: Oriented to person, place, and time and well-developed, well-nourished, and in no distress.  HENT:  Head: Normocephalic and atraumatic.  Mouth/Throat: Oropharynx is clear and moist. No oropharyngeal exudate.  Eyes: Conjunctivae are normal. Right eye exhibits no discharge. Left eye exhibits no discharge. No scleral icterus.  Neck: Normal range of motion. Neck supple.  Cardiovascular: Normal rate, regular rhythm, normal heart sounds and intact distal pulses.   Pulmonary/Chest: Effort normal and breath sounds normal. No respiratory distress. No wheezes. No rales.  Abdominal: Soft. Bowel sounds are normal. Exhibits no distension and no mass. There is no tenderness.  Musculoskeletal: Normal range of motion. Exhibits no edema.  Lymphadenopathy:    No cervical adenopathy.  Neurological: Alert and oriented to person, place, and time. Exhibits normal muscle tone. Gait normal. Coordination normal.  Skin: Skin is warm and dry. Mild rash on scalp. Not diaphoretic. No erythema. No pallor.  Psychiatric: Mood, memory and judgment normal.  Vitals reviewed.  LABORATORY DATA: Lab Results  Component Value Date   WBC 6.5 09/02/2024   HGB 13.7 09/02/2024   HCT 41.4 09/02/2024   MCV 84.5 09/02/2024   PLT 241 09/02/2024      Chemistry      Component Value Date/Time   NA 142 09/02/2024 1014   NA 143 10/30/2017 1031   K 3.8 09/02/2024 1014    K 4.3 10/30/2017 1031   CL 105 09/02/2024 1014   CL 102 04/30/2013 1002   CO2 33 (H) 09/02/2024 1014   CO2 29 10/30/2017 1031   BUN 8 09/02/2024 1014   BUN 14.6 10/30/2017 1031   CREATININE 0.55 09/02/2024 1014   CREATININE 0.7 10/30/2017 1031      Component Value Date/Time   CALCIUM  9.2 09/02/2024 1014   CALCIUM  9.2 10/30/2017 1031   ALKPHOS 78 09/02/2024 1014   ALKPHOS 103 10/30/2017 1031   AST 25 09/02/2024 1014   AST 20 10/30/2017 1031   ALT 19 09/02/2024 1014   ALT 14 10/30/2017 1031   BILITOT 1.5 (H) 09/02/2024 1014   BILITOT 0.79 10/30/2017 1031       RADIOGRAPHIC STUDIES:  NM PET Image Restage (PS) Skull Base to Thigh (F-18 FDG) Result Date: 08/27/2024 EXAM: PET AND CT SKULL BASE TO MID THIGH 08/26/2024 11:46:34 AM TECHNIQUE: RADIOPHARMACEUTICAL: 5.5 mCi F-18 FDG Uptake time 60 minutes. Glucose level 101 mg/dl. PET imaging was acquired from the base of the skull to the mid thighs. Non-contrast enhanced computed tomography was obtained for attenuation correction and anatomic localization. COMPARISON: 03/31/2023, and chest CT from 08/05/2024. CLINICAL HISTORY: Non-small cell lung cancer (NSCLC), staging. Bronchial biopsies 09/05/2023. FINDINGS: HEAD AND NECK: No metabolically active cervical lymphadenopathy. Hypermetabolic right thyroid  nodule with maximum SUV 9.8, previously 9.2. The patient underwent thyroid  ultrasound on 11/20/2023 and fine-needle aspiration was recommended. If FNA was not previously performed, thyroid  fine needle aspiration is recommended to exclude thyroid  cancer. CHEST: There is evidence of right lower lobectomy and possibly right middle lobectomy, correlate with operative history. Consolidation in the right upper lobe especially inferiorly with adjacent ground glass opacities anteriorly as on 08/05/2024. Within this region of consolidation there is patchy low-grade activity with maximum SUV 3.1. Adjacent postoperative findings noted. On the previous PET  CT, maximum SUV in the consolidative region was 3.4. A 0.8 cm in short axis lymph node in the right epicardial adipose tissues under image 75 series 4 has maximum SUV of 2.6, previously 2.2. Left port-a-cath tip: Lower SVC. Aortic atherosclerosis. Mild cardiomegaly. No metabolically active pulmonary nodules. No metabolically active lymphadenopathy (other than the epicardial node). ABDOMEN AND PELVIS: Stable substantial uterine prominence without significant hypermetabolic activity, most likely to be due to uterine fibroids. No metabolically active intraperitoneal mass. No metabolically active lymphadenopathy. Physiologic activity within the gastrointestinal and genitourinary systems. BONES AND SOFT TISSUE: No abnormal FDG activity localizes to the bones. No metabolically active aggressive osseous lesion. IMPRESSION: 1. Hypermetabolic right thyroid  nodule (SUV max 9.8, previously 9.2). Recommend thyroid  FNA if not previously performed. 2. Stable right upper lobe consolidation with low-grade uptake (SUV max 3.1, previously 3.4), without evidence of focal metabolically active disease.  3. Mild cardiomegaly. 4. Aortic atherosclerosis. Electronically signed by: Ryan Salvage MD 08/27/2024 12:19 PM EDT RP Workstation: [id number]   CT Chest W Contrast Result Date: 08/08/2024 CLINICAL DATA:  Non-small cell lung cancer follow-up. * Tracking Code: BO * EXAM: CT CHEST WITH CONTRAST TECHNIQUE: Multidetector CT imaging of the chest was performed during intravenous contrast administration. RADIATION DOSE REDUCTION: This exam was performed according to the departmental dose-optimization program which includes automated exposure control, adjustment of the mA and/or kV according to patient size and/or use of iterative reconstruction technique. CONTRAST:  75mL OMNIPAQUE  IOHEXOL  300 MG/ML  SOLN COMPARISON:  Multiple priors including CT January 01, 2024 FINDINGS: Cardiovascular: Accessed left chest Port-A-Cath with the tip near  the superior cavoatrial junction. Aortic atherosclerosis. Enlarged main pulmonary artery. Normal size heart. No significant pericardial effusion/thickening. Mediastinum/Nodes: No suspicious thyroid  nodule. Similar rightward deviation of the mediastinum. Right cardiophrenic lymph node measures 9 mm in short axis on image 33/2, unchanged. Lungs/Pleura: Surgical changes of prior right upper lobectomy. Slight increase in size of the small right partially loculated pleural effusion. Increased consolidation and ground-glass in the posteroinferior right upper lobe with the mixed attenuation right perihilar component measuring 8.1 x 4.5 cm on image 67/7 previously 7.5 x 6.1 cm. New dense consolidation about the right lower lobe mixed attenuation nodule with fiducial marker in place on image 75/7 common no longer discretely measurable on image 75/7. Additional previously indexed pulmonary nodules are obscured by consolidation. Upper Abdomen: No acute abnormality. Musculoskeletal: No aggressive lytic or blastic lesion of bone. Similar probable radiation induced irregularity of the posterior upper right ribs mostly sixth and seventh. Remote left clavicular fracture. IMPRESSION: 1. Increased consolidation and ground-glass in the posteroinferior right upper lobe with the mixed attenuation right perihilar component measuring 8.1 x 4.5 cm previously 7.5 x 6.1 cm. 2. New dense consolidation about the right lower lobe mixed attenuation nodule with fiducial marker in place, no longer discretely measurable. 3. Slight increase in size of the small right partially loculated pleural effusion. 4. Additional previously indexed pulmonary nodules are obscured by consolidation. 5. Aortic atherosclerosis. Aortic Atherosclerosis (ICD10-I70.0). Electronically Signed   By: Reyes Holder M.D.   On: 08/08/2024 15:04     ASSESSMENT/PLAN:  This is a very pleasant 63 year old Asian female with recurrent non-small cell lung cancer adenocarcinoma.   She is status post resection followed by systemic chemotherapy with carboplatin  and Alimta .  She was started on treatment with Tarceva  150 mg by mouth for 145 months since 2013.   She had a repeat CT scan performed recently which showed some concern for increased consolidation in the right upper lobe.  Therefore Dr. Sherrod arrange for PET scan to further evaluate this.  The patient was seen with Dr. Sherrod today.  Dr. Sherrod personally and independently reviewed the scan and discussed the results with the patient today.  The scan showed stable right upper lobe consolidation with low-grade uptake without any evidence of focal hypermetabolic activity. Dr. Sherrod recommends he continue on Tarceva  at the same dose.  We will see her back for labs and a follow-up in 2 months.  I will send her low-dose of Remeron to take at nighttime to help with her appetite and insomnia.  Hemoptysis, improving Improvement with only small amount of pink-tinged sputum, no significant coughing or shortness of breath.   The patient was advised to call immediately if she has any concerning symptoms in the interval. The patient voices understanding of current disease status and treatment options and  is in agreement with the current care plan. All questions were answered. The patient knows to call the clinic with any problems, questions or concerns. We can certainly see the patient much sooner if necessary      Orders Placed This Encounter  Procedures   CBC with Differential (Cancer Center Only)    Standing Status:   Future    Expected Date:   10/31/2024    Expiration Date:   09/02/2025   CMP (Cancer Center only)    Standing Status:   Future    Expected Date:   10/31/2024    Expiration Date:   09/02/2025     Jaiona Simien L Imunique Samad, PA-C 09/02/24  ADDENDUM: Hematology/Oncology Attending:  I had a face-to-face encounter with the patient today.  I reviewed her records, lab, scan and recommended her care  plan.  This is a very pleasant 63 years old Asian female with recurrent non-small cell lung cancer, adenocarcinoma that was initially diagnosed as stage I P in March 2010.  The patient has been on treatment with Tarceva  150 mg p.o. daily since August 2013.  She has been tolerating her treatment well except for the few episodes of diarrhea and the intermittent skin rash.  She also has persistent dry cough secondary to previous radiotherapy involving the right lung.  She was found on recent CT scan of the chest to have increased consolidation in the right lung and we ordered a PET scan for further eval ration of this area.  Her PET scan showed no concerning finding for disease progression. I recommended for the patient to continue her current treatment with Tarceva  with the same dose. We will see her back for follow-up visit in 2 months for evaluation and repeat blood work. The patient was advised to call immediately if she has any other concerning symptoms in the interval. The total time spent in the appointment was 30 minutes including review of chart and various tests results, discussions about plan of care and coordination of care plan . Disclaimer: This note was dictated with voice recognition software. Similar sounding words can inadvertently be transcribed and may be missed upon review. Sherrod MARLA Sherrod, MD

## 2024-09-02 ENCOUNTER — Inpatient Hospital Stay: Attending: Internal Medicine

## 2024-09-02 ENCOUNTER — Inpatient Hospital Stay (HOSPITAL_BASED_OUTPATIENT_CLINIC_OR_DEPARTMENT_OTHER): Admitting: Physician Assistant

## 2024-09-02 VITALS — BP 140/79 | HR 88 | Temp 98.0°F | Resp 16 | Ht <= 58 in | Wt 106.0 lb

## 2024-09-02 DIAGNOSIS — Z79630 Long term (current) use of alkylating agent: Secondary | ICD-10-CM | POA: Insufficient documentation

## 2024-09-02 DIAGNOSIS — G4700 Insomnia, unspecified: Secondary | ICD-10-CM | POA: Diagnosis not present

## 2024-09-02 DIAGNOSIS — C3431 Malignant neoplasm of lower lobe, right bronchus or lung: Secondary | ICD-10-CM | POA: Insufficient documentation

## 2024-09-02 DIAGNOSIS — C3490 Malignant neoplasm of unspecified part of unspecified bronchus or lung: Secondary | ICD-10-CM

## 2024-09-02 DIAGNOSIS — R630 Anorexia: Secondary | ICD-10-CM | POA: Diagnosis not present

## 2024-09-02 LAB — CBC WITH DIFFERENTIAL (CANCER CENTER ONLY)
Abs Immature Granulocytes: 0.01 K/uL (ref 0.00–0.07)
Basophils Absolute: 0 K/uL (ref 0.0–0.1)
Basophils Relative: 1 %
Eosinophils Absolute: 0.1 K/uL (ref 0.0–0.5)
Eosinophils Relative: 1 %
HCT: 41.4 % (ref 36.0–46.0)
Hemoglobin: 13.7 g/dL (ref 12.0–15.0)
Immature Granulocytes: 0 %
Lymphocytes Relative: 23 %
Lymphs Abs: 1.5 K/uL (ref 0.7–4.0)
MCH: 28 pg (ref 26.0–34.0)
MCHC: 33.1 g/dL (ref 30.0–36.0)
MCV: 84.5 fL (ref 80.0–100.0)
Monocytes Absolute: 0.4 K/uL (ref 0.1–1.0)
Monocytes Relative: 6 %
Neutro Abs: 4.5 K/uL (ref 1.7–7.7)
Neutrophils Relative %: 69 %
Platelet Count: 241 K/uL (ref 150–400)
RBC: 4.9 MIL/uL (ref 3.87–5.11)
RDW: 15.2 % (ref 11.5–15.5)
WBC Count: 6.5 K/uL (ref 4.0–10.5)
nRBC: 0 % (ref 0.0–0.2)

## 2024-09-02 LAB — CMP (CANCER CENTER ONLY)
ALT: 19 U/L (ref 0–44)
AST: 25 U/L (ref 15–41)
Albumin: 4.2 g/dL (ref 3.5–5.0)
Alkaline Phosphatase: 78 U/L (ref 38–126)
Anion gap: 4 — ABNORMAL LOW (ref 5–15)
BUN: 8 mg/dL (ref 8–23)
CO2: 33 mmol/L — ABNORMAL HIGH (ref 22–32)
Calcium: 9.2 mg/dL (ref 8.9–10.3)
Chloride: 105 mmol/L (ref 98–111)
Creatinine: 0.55 mg/dL (ref 0.44–1.00)
GFR, Estimated: >60 mL/min (ref >60)
Glucose, Bld: 85 mg/dL (ref 70–99)
Potassium: 3.8 mmol/L (ref 3.5–5.1)
Sodium: 142 mmol/L (ref 135–145)
Total Bilirubin: 1.5 mg/dL — ABNORMAL HIGH (ref 0.0–1.2)
Total Protein: 7.4 g/dL (ref 6.5–8.1)

## 2024-09-02 MED ORDER — MIRTAZAPINE 7.5 MG PO TABS: 7.5000 mg | ORAL_TABLET | Freq: Every day | ORAL | 2 refills | Status: AC

## 2024-10-14 ENCOUNTER — Telehealth: Payer: Self-pay | Admitting: Surgery

## 2024-10-14 ENCOUNTER — Other Ambulatory Visit: Payer: Self-pay | Admitting: Internal Medicine

## 2024-10-14 MED ORDER — DOXYCYCLINE HYCLATE 100 MG PO TABS: 100.0000 mg | ORAL_TABLET | Freq: Two times a day (BID) | ORAL | 0 refills | Status: DC

## 2024-10-14 MED ORDER — METHYLPREDNISOLONE 4 MG PO TBPK
ORAL_TABLET | ORAL | 0 refills | Status: AC
Start: 2024-10-14 — End: ?

## 2024-10-14 NOTE — Telephone Encounter (Signed)
 Pt son called and stated his mom has had a skin rash for about a week on her nose and lips. He said it is so significant that he could barely recognize his mom and is concerned. Denies rash on inside of mouth, issues breathing, talking, swallowing, or eating/drinking. She also has rash/cracked skin on her hands. Advised that this is a common side effect of her chemo and that she can use unscented lotion on her hands. Advised that Dr Sherrod would be notified of symptoms and our office will call back with further recommendations.

## 2024-10-14 NOTE — Telephone Encounter (Signed)
 Son called back and was made aware of stopping medication, picking up prescription for antibiotics and steroids, and upcoming appointment for 11/24 at 9:45am. No other questions or concerns at this time.

## 2024-10-14 NOTE — Telephone Encounter (Signed)
 LVM for pt's son advising pt to stop treatment until she can be seen by Dr Sherrod. Our office will call back with new scheduled appointment. Also, Dr Sherrod will call in 2 prescriptions for patient to treat rash.

## 2024-10-21 ENCOUNTER — Inpatient Hospital Stay: Attending: Internal Medicine | Admitting: Internal Medicine

## 2024-10-21 ENCOUNTER — Other Ambulatory Visit: Payer: Self-pay | Admitting: Internal Medicine

## 2024-10-21 ENCOUNTER — Inpatient Hospital Stay

## 2024-10-21 VITALS — BP 124/63 | HR 56 | Temp 97.9°F | Resp 17 | Ht 59.0 in | Wt 109.0 lb

## 2024-10-21 DIAGNOSIS — Z23 Encounter for immunization: Secondary | ICD-10-CM | POA: Diagnosis not present

## 2024-10-21 DIAGNOSIS — C3411 Malignant neoplasm of upper lobe, right bronchus or lung: Secondary | ICD-10-CM

## 2024-10-21 DIAGNOSIS — C3431 Malignant neoplasm of lower lobe, right bronchus or lung: Secondary | ICD-10-CM | POA: Diagnosis not present

## 2024-10-21 DIAGNOSIS — R059 Cough, unspecified: Secondary | ICD-10-CM

## 2024-10-21 MED ORDER — INFLUENZA VIRUS VACC SPLIT PF (FLUZONE) 0.5 ML IM SUSY
0.5000 mL | PREFILLED_SYRINGE | Freq: Once | INTRAMUSCULAR | Status: AC
  Administered 2024-10-21: 0.5 mL via INTRAMUSCULAR
  Filled 2024-10-21: qty 0.5

## 2024-10-21 MED ORDER — CLINDAMYCIN PHOSPHATE 1 % EX LOTN: TOPICAL_LOTION | Freq: Two times a day (BID) | CUTANEOUS | 0 refills | Status: DC

## 2024-10-21 MED ORDER — INFLUENZA VIRUS VACC SPLIT PF (FLUZONE) 0.5 ML IM SUSY: 0.5000 mL | PREFILLED_SYRINGE | INTRAMUSCULAR | Status: DC

## 2024-10-21 MED ORDER — HYDROCOD POLI-CHLORPHE POLI ER 10-8 MG/5ML PO SUER: 5.0000 mL | Freq: Two times a day (BID) | ORAL | 0 refills | Status: AC | PRN

## 2024-10-21 NOTE — Addendum Note (Signed)
 Addended by: LANDY BRUNET A on: 10/21/2024 10:13 AM   Modules accepted: Orders

## 2024-10-21 NOTE — Progress Notes (Signed)
 #        [name] [phone]) [phone]   [phone]  OFFICE PROGRESS NOTE  [name], [name] [address] [place] [address]  PRINCIPAL DIAGNOSIS: Local recurrence of non-small cell lung cancer, adenocarcinoma, initially diagnosed as stage IB (T2a N0 M0) in March of 2010.   PRIOR THERAPY:  Status post right lower lobectomy under the care of Dr. Lucas on 04/08/2009. Status post palliative radiotherapy to the right lower lobe recurrent lung mass under the care of Dr. Dewey, completed on June 08, 2011. Systemic chemotherapy with carboplatin  for AUC of 5 and Alimta  500 mg/M2 every 3 weeks. She is status post 3 cycles. SBRT to the recurrent right lung tumor under the care of Dr. Patrcia, last dose on 10/30/23.   CURRENT THERAPY: Tarceva  150 mg by mouth daily, therapy beginning 07/24/2012. Status post approximately 147 months of therapy.   INTERVAL HISTORY: 63 years old Asian female returns to the clinic today for follow-up visit accompanied by her interpreter. Discussed the use of AI scribe software for clinical note transcription with the patient, who gave verbal consent to proceed.  History of Present Illness [name] is a 63 year old female with lung cancer who presents with a rash and cough following treatment with Tarceva .  She developed a rash on her face as an adverse effect of Tarceva , which was severe enough that her son did not recognize her. She was prescribed steroids and doxycycline , which have improved her skin condition. She stopped taking Tarceva  nearly a week ago due to the rash.  She has a persistent cough, which sometimes produces blood. The cough varies in depth, sometimes being deep and sometimes originating from the throat. She has a history of radiation therapy to her right lung. She has not started any new medications recently, aside from her usual Tylenol  and prescribed medications.  She has been avoiding grapefruit and  citrus products, which can interact with her medication.     MEDICAL HISTORY: Past Medical History:  Diagnosis Date   Drug-induced skin rash 02/27/2017   Encounter for antineoplastic chemotherapy 01/19/2016   History of radiation therapy 05/02/11 to 06/08/11   right lung   Lung cancer (HCC)    right lower lobe adenocarcinoma    ALLERGIES:  is allergic to codeine  and doxycycline .  MEDICATIONS:  Current Outpatient Medications  Medication Sig Dispense Refill   albuterol  (PROAIR  HFA) 108 (90 Base) MCG/ACT inhaler Inhale 1-2 puffs into the lungs every 6 (six) hours as needed for wheezing or shortness of breath. 6.7 g 2   chlorpheniramine-HYDROcodone  (TUSSIONEX) 10-8 MG/5ML Take 5 mLs by mouth every 12 (twelve) hours as needed for cough. 473 mL 0   clindamycin  (CLEOCIN  T) 1 % lotion Apply topically 2 (two) times daily. 60 mL 0   clindamycin  (CLEOCIN  T) 1 % lotion Apply topically 2 (two) times daily. 60 mL 0   diltiazem  (CARDIZEM ) 60 MG tablet Take 1 tablet (60 mg total) by mouth as needed (high heart rate). 60 tablet 3   diltiazem  (TIADYLT  ER) 120 MG 24 hr capsule Take 1 capsule by mouth daily. 90 capsule 3   doxycycline  (VIBRA -TABS) 100 MG tablet Take 1 tablet (100 mg total) by mouth 2 (two) times daily. 20 tablet 0   doxycycline  (VIBRA -TABS) 100 MG tablet Take 1 tablet (100 mg total) by mouth 2 (two) times daily. 14 tablet 0   doxycycline  (VIBRA -TABS) 100 MG tablet Take 1 tablet (100 mg total) by mouth  2 (two) times daily. 14 tablet 0   erlotinib  (TARCEVA ) 150 MG tablet Take 1 tablet (150 mg total) by mouth daily. Take on an empty stomach 1 hour before meals or 2 hours after. 30 tablet 2   methylPREDNISolone  (MEDROL  DOSEPAK) 4 MG TBPK tablet Use as instructed 21 tablet 0   mirtazapine  (REMERON ) 7.5 MG tablet Take 1 tablet (7.5 mg total) by mouth at bedtime. 30 tablet 2   prochlorperazine  (COMPAZINE ) 10 MG tablet Take 1 tablet (10 mg total) by mouth every 6 (six) hours as needed. 30 tablet 2    silver  sulfADIAZINE  (SILVADENE ) 1 % cream Apply topically daily. Cleanse foot wound with water, pat dry. Apply Silvadene  daily. Top with dry dressing     No current facility-administered medications for this visit.    SURGICAL HISTORY:  Past Surgical History:  Procedure Laterality Date   BRONCHIAL BIOPSY  09/05/2023   Procedure: BRONCHIAL BIOPSIES;  Surgeon: Shelah Lamar RAMAN, [name];  Location: [facility] ENDOSCOPY;  Service: Pulmonary;;   BRONCHIAL BRUSHINGS  09/05/2023   Procedure: BRONCHIAL BRUSHINGS;  Surgeon: Shelah Lamar RAMAN, [name];  Location: [facility] ENDOSCOPY;  Service: Pulmonary;;   BRONCHIAL NEEDLE ASPIRATION BIOPSY  09/05/2023   Procedure: BRONCHIAL NEEDLE ASPIRATION BIOPSIES;  Surgeon: Shelah Lamar RAMAN, [name];  Location: [facility] ENDOSCOPY;  Service: Pulmonary;;   FIDUCIAL MARKER PLACEMENT  09/05/2023   Procedure: FIDUCIAL MARKER PLACEMENT;  Surgeon: Shelah Lamar RAMAN, [name];  Location: [facility] ENDOSCOPY;  Service: Pulmonary;;   LUNG LOBECTOMY  04/18/2009   RLL   PORTACATH PLACEMENT  02/29/2012   Procedure: INSERTION PORT-A-CATH;  Surgeon: Nancyann SHAUNNA Nam, [name];  Location: [facility] OR;  Service: Thoracic;  Laterality: Left;  PowerPort 8 F attachable in left internal jugular.    REVIEW OF SYSTEMS:  Constitutional: positive for fatigue Eyes: negative Ears, nose, mouth, throat, and face: negative Respiratory: positive for cough and hemoptysis Cardiovascular: negative Gastrointestinal: negative Genitourinary:negative Integument/breast: positive for rash Hematologic/lymphatic: negative Musculoskeletal:negative Neurological: negative Behavioral/Psych: negative Endocrine: negative Allergic/Immunologic: negative   PHYSICAL EXAMINATION: General appearance: alert, cooperative, appears stated age, fatigued, and no distress Head: Normocephalic, without obvious abnormality, atraumatic Neck: no adenopathy, no JVD, supple, symmetrical, trachea midline, and thyroid  not enlarged, symmetric, no tenderness/mass/nodules Lymph nodes:  Cervical, supraclavicular, and axillary nodes normal. Resp: clear to auscultation bilaterally Back: symmetric, no curvature. ROM normal. No CVA tenderness. Cardio: regular rate and rhythm, S1, S2 normal, no murmur, click, rub or gallop GI: soft, non-tender; bowel sounds normal; no masses,  no organomegaly Extremities: extremities normal, atraumatic, no cyanosis or edema Neurologic: Alert and oriented X 3, normal strength and tone. Normal symmetric reflexes. Normal coordination and gait  ECOG PERFORMANCE STATUS: 1 - Symptomatic but completely ambulatory  Blood pressure 124/63, pulse (!) 56, temperature 97.9 F (36.6 C), temperature source Temporal, resp. rate 17, height 4' 11 (1.499 m), weight 109 lb (49.4 kg), last menstrual period 02/10/2012, SpO2 100%.  LABORATORY DATA: Lab Results  Component Value Date   WBC 6.5 09/02/2024   HGB 13.7 09/02/2024   HCT 41.4 09/02/2024   MCV 84.5 09/02/2024   PLT 241 09/02/2024      Chemistry      Component Value Date/Time   NA 142 09/02/2024 1014   NA 143 10/30/2017 1031   K 3.8 09/02/2024 1014   K 4.3 10/30/2017 1031   CL 105 09/02/2024 1014   CL 102 04/30/2013 1002   CO2 33 (H) 09/02/2024 1014   CO2 29 10/30/2017 1031   BUN 8 09/02/2024 1014  BUN 14.6 10/30/2017 1031   CREATININE 0.55 09/02/2024 1014   CREATININE 0.7 10/30/2017 1031      Component Value Date/Time   CALCIUM  9.2 09/02/2024 1014   CALCIUM  9.2 10/30/2017 1031   ALKPHOS 78 09/02/2024 1014   ALKPHOS 103 10/30/2017 1031   AST 25 09/02/2024 1014   AST 20 10/30/2017 1031   ALT 19 09/02/2024 1014   ALT 14 10/30/2017 1031   BILITOT 1.5 (H) 09/02/2024 1014   BILITOT 0.79 10/30/2017 1031       RADIOGRAPHIC STUDIES: No results found.     ASSESSMENT AND PLAN:  This is a very pleasant 63 years old Asian female with recurrent non-small cell lung cancer, adenocarcinoma status post surgical resection followed by systemic chemotherapy with carboplatin  and Alimta  followed  by disease progression and the patient was started on treatment with Tarceva  150 mg by mouth daily status post 147 months  The patient was referred to Dr. Shelah and she underwent bronchoscopy that showed recurrent adenocarcinoma. She was treated with SBRT to the right upper lobe lung mass under the care of Dr. Patrcia. The patient has been tolerating her treatment with Tarceva  fairly well. Assessment and Plan Assessment & Plan Adverse effect of Tarceva  (erlotinib ) therapy including drug-induced rash Rash on the face likely due to Tarceva  (erlotinib ) therapy. Rash has improved with steroids and doxycycline . Rash is not severe enough to prevent resumption of Tarceva . - Resume Tarceva  at the same dose. - Avoid grapefruit and grapefruit juice to prevent increased drug levels. - Monitor for recurrence of rash and contact the clinic if it occurs.  Cough with intermittent hemoptysis in the context of prior right lung radiation Intermittent cough with occasional hemoptysis, likely related to inflammation in the right lung from previous radiation therapy. Cough is sometimes associated with throat irritation. - Prescribed cough medicine. - Refilled cough medicine prescription. The patient was advised to call immediately if she has any concerning symptoms in the interval. All questions were answered. The patient knows to call the clinic with any problems, questions or concerns. We can certainly see the patient much sooner if necessary.  The total time spent in the appointment was 30 minutes.  Disclaimer: This note was dictated with voice recognition software. Similar sounding words can inadvertently be transcribed and may not be corrected upon review.

## 2024-10-31 NOTE — Progress Notes (Signed)
 [facility] OFFICE PROGRESS NOTE  Kathryn Emil Schanz, MD [address] [place] [address]  DIAGNOSIS: Local recurrence of non-small cell lung cancer, adenocarcinoma, initially diagnosed as stage IB (T2a N0 M0) in March of 2010.   PRIOR THERAPY: Status post right lower lobectomy under the care of Dr. Lucas on 04/08/2009. Status post palliative radiotherapy to the right lower lobe recurrent lung mass under the care of Dr. Dewey, completed on June 08, 2011. Systemic chemotherapy with carboplatin  for AUC of 5 and Alimta  500 mg/M2 every 3 weeks. She is status post 3 cycles. SBRT to the recurrent right lung tumor under the care of Dr. Patrcia, last dose on 10/30/23.   CURRENT THERAPY: Tarceva  150 mg by mouth daily, therapy beginning 07/24/2012. Status post approximately 147 months of therapy.   INTERVAL HISTORY: [name] 63 y.o. female returns to the clinic today for a follow-up visit.  The patient was last seen by Dr. Sherrod on 10/21/24.   Her last appointment she was having worsening rash on her face as a side effect of Tarceva . Her rash was so severe that her son reportedly did not recognize her. She was given steroids and doxycycline . Her rash has since improved. The rash was primarily on her face and near her eyes. Her legs are dry and flaky. She uses lotion.   She has a persistent cough with blood in the mucus, which has not improved significantly since completing antibiotics. She uses a cough medicine to aid sleep, but without it, she struggles to rest. No fever, chills, or night sweats. Dr. Sherrod prescribed her tussinex which is effective. Without it, she thinks she would not be able to sleep.   She experiences intermittent diarrhea every other day but does not use Imodium or other medications to manage it. No significant changes in bowel habits since her last visit.  I previously started her on remeron  due to insomnia and decreased appetite.    She is here  today for evaluation and repeat blood work.   MEDICAL HISTORY: Past Medical History:  Diagnosis Date   Drug-induced skin rash 02/27/2017   Encounter for antineoplastic chemotherapy 01/19/2016   History of radiation therapy 05/02/11 to 06/08/11   right lung   Lung cancer (HCC)    right lower lobe adenocarcinoma    ALLERGIES:  is allergic to codeine  and doxycycline .  MEDICATIONS:  Current Outpatient Medications  Medication Sig Dispense Refill   albuterol  (PROAIR  HFA) 108 (90 Base) MCG/ACT inhaler Inhale 1-2 puffs into the lungs every 6 (six) hours as needed for wheezing or shortness of breath. 6.7 g 2   chlorpheniramine-HYDROcodone  (TUSSIONEX) 10-8 MG/5ML Take 5 mLs by mouth every 12 (twelve) hours as needed for cough. 473 mL 0   clindamycin  (CLEOCIN  T) 1 % lotion Apply topically 2 (two) times daily. 60 mL 0   clindamycin  (CLEOCIN  T) 1 % lotion Apply topically 2 (two) times daily. 60 mL 0   diltiazem  (CARDIZEM ) 60 MG tablet Take 1 tablet (60 mg total) by mouth as needed (high heart rate). 60 tablet 3   diltiazem  (TIADYLT  ER) 120 MG 24 hr capsule Take 1 capsule by mouth daily. 90 capsule 3   doxycycline  (VIBRA -TABS) 100 MG tablet Take 1 tablet (100 mg total) by mouth 2 (two) times daily. 20 tablet 0   doxycycline  (VIBRA -TABS) 100 MG tablet Take 1 tablet (100 mg total) by mouth 2 (two) times daily. 14 tablet 0   doxycycline  (VIBRA -TABS) 100 MG tablet Take 1  tablet (100 mg total) by mouth 2 (two) times daily. 14 tablet 0   erlotinib  (TARCEVA ) 150 MG tablet Take 1 tablet (150 mg total) by mouth daily. Take on an empty stomach 1 hour before meals or 2 hours after. 30 tablet 2   methylPREDNISolone  (MEDROL  DOSEPAK) 4 MG TBPK tablet Use as instructed 21 tablet 0   mirtazapine  (REMERON ) 7.5 MG tablet Take 1 tablet (7.5 mg total) by mouth at bedtime. 30 tablet 2   prochlorperazine  (COMPAZINE ) 10 MG tablet Take 1 tablet (10 mg total) by mouth every 6 (six) hours as needed. 30 tablet 2   silver   sulfADIAZINE  (SILVADENE ) 1 % cream Apply topically daily. Cleanse foot wound with water, pat dry. Apply Silvadene  daily. Top with dry dressing     No current facility-administered medications for this visit.    SURGICAL HISTORY:  Past Surgical History:  Procedure Laterality Date   BRONCHIAL BIOPSY  09/05/2023   Procedure: BRONCHIAL BIOPSIES;  Surgeon: Shelah Lamar RAMAN, MD;  Location: [facility] ENDOSCOPY;  Service: Pulmonary;;   BRONCHIAL BRUSHINGS  09/05/2023   Procedure: BRONCHIAL BRUSHINGS;  Surgeon: Shelah Lamar RAMAN, MD;  Location: [facility] ENDOSCOPY;  Service: Pulmonary;;   BRONCHIAL NEEDLE ASPIRATION BIOPSY  09/05/2023   Procedure: BRONCHIAL NEEDLE ASPIRATION BIOPSIES;  Surgeon: Shelah Lamar RAMAN, MD;  Location: [facility] ENDOSCOPY;  Service: Pulmonary;;   FIDUCIAL MARKER PLACEMENT  09/05/2023   Procedure: FIDUCIAL MARKER PLACEMENT;  Surgeon: Shelah Lamar RAMAN, MD;  Location: [facility] ENDOSCOPY;  Service: Pulmonary;;   LUNG LOBECTOMY  04/18/2009   RLL   PORTACATH PLACEMENT  02/29/2012   Procedure: INSERTION PORT-A-CATH;  Surgeon: Nancyann SHAUNNA Nam, MD;  Location: [facility] OR;  Service: Thoracic;  Laterality: Left;  PowerPort 8 F attachable in left internal jugular.    REVIEW OF SYSTEMS:   Review of Systems  Constitutional: Negative for appetite change, chills, fatigue, fever and unexpected weight change.  HENT: Negative for mouth sores, nosebleeds, sore throat and trouble swallowing.   Eyes: Negative for eye problems and icterus.  Respiratory: Positive for persistent cough. Negative for hemoptysis, shortness of breath and wheezing.   Cardiovascular: Negative for chest pain and leg swelling.  Gastrointestinal: Negative for abdominal pain, constipation, diarrhea, nausea and vomiting.  Genitourinary: Negative for bladder incontinence, difficulty urinating, dysuria, frequency and hematuria.   Musculoskeletal: Negative for back pain, gait problem, neck pain and neck stiffness.  Skin: Improved rash and itching.  Neurological:  Negative for dizziness, extremity weakness, gait problem, headaches, light-headedness and seizures.  Hematological: Negative for adenopathy. Does not bruise/bleed easily.  Psychiatric/Behavioral: Negative for confusion, depression and sleep disturbance. The patient is not nervous/anxious.    PHYSICAL EXAMINATION:  Blood pressure 133/75, pulse (!) 55, temperature (!) 97.4 F (36.3 C), temperature source Temporal, resp. rate 16, weight 111 lb 3.2 oz (50.4 kg), last menstrual period 02/10/2012, SpO2 99%.  ECOG PERFORMANCE STATUS: 1  Physical Exam  Constitutional: Oriented to person, place, and time and well-developed, well-nourished, and in no distress.  HENT:  Head: Normocephalic and atraumatic.  Mouth/Throat: Oropharynx is clear and moist. No oropharyngeal exudate.  Eyes: Conjunctivae are normal. Right eye exhibits no discharge. Left eye exhibits no discharge. No scleral icterus.  Neck: Normal range of motion. Neck supple.  Cardiovascular: Normal rate, regular rhythm, normal heart sounds and intact distal pulses.   Pulmonary/Chest: Effort normal. Stable diminished breath sounds in right lung. No respiratory distress. No wheezes. No rales.  Abdominal: Soft. Bowel sounds are normal. Exhibits no distension and no mass. There is  no tenderness.  Musculoskeletal: Normal range of motion. Exhibits no edema.  Lymphadenopathy:    No cervical adenopathy.  Neurological: Alert and oriented to person, place, and time. Exhibits normal muscle tone. Gait normal. Coordination normal.  Skin: Skin is warm and dry. No rash noted. Not diaphoretic. No erythema. No pallor.  Psychiatric: Mood, memory and judgment normal.  Vitals reviewed.  LABORATORY DATA: Lab Results  Component Value Date   WBC 7.0 11/04/2024   HGB 12.3 11/04/2024   HCT 39.5 11/04/2024   MCV 90.4 11/04/2024   PLT 245 11/04/2024      Chemistry      Component Value Date/Time   NA 142 11/04/2024 0928   NA 143 10/30/2017 1031   K 4.3  11/04/2024 0928   K 4.3 10/30/2017 1031   CL 104 11/04/2024 0928   CL 102 04/30/2013 1002   CO2 30 11/04/2024 0928   CO2 29 10/30/2017 1031   BUN 7 (L) 11/04/2024 0928   BUN 14.6 10/30/2017 1031   CREATININE 0.65 11/04/2024 0928   CREATININE 0.7 10/30/2017 1031      Component Value Date/Time   CALCIUM  8.8 (L) 11/04/2024 0928   CALCIUM  9.2 10/30/2017 1031   ALKPHOS 110 11/04/2024 0928   ALKPHOS 103 10/30/2017 1031   AST 27 11/04/2024 0928   AST 20 10/30/2017 1031   ALT 15 11/04/2024 0928   ALT 14 10/30/2017 1031   BILITOT 0.5 11/04/2024 0928   BILITOT 0.79 10/30/2017 1031       RADIOGRAPHIC STUDIES:  No results found.   ASSESSMENT/PLAN:  This is a very pleasant 63 year old Asian female with recurrent non-small cell lung cancer adenocarcinoma.  She is status post resection followed by systemic chemotherapy with carboplatin  and Alimta .   She was started on treatment with Tarceva  150 mg by mouth for 145 months since 2013.    Case reviewed and discussed with Dr. Sherrod. The patient recently completed a course of antibiotics, and given the absence of new infectious symptoms and improvement in her rash, repeat antibiotics are not indicated at this time.  The patient reports intermittent blood-tinged mucus over the past several months. Per prior evaluation--including PET imaging-- Dr. Sherrod feels this may be related to airway inflammation. At this time, no additional acute intervention is required. The patient will continue monitoring her symptoms.  She was counseled on red-flag signs, including any increase in frequency or volume of bleeding or development of frank hemoptysis. Should these occur, she was advised to seek immediate emergency evaluation.   She will continue taking tarceva . If she has recurrent rash, she knows to call for refill of steroids and doxycycline . She will use lotion for the dry skin on the legs.   I refilled her clindamycin  lotion.   I have ordered a  repeat CT scan of there chest.   She knows to avoid grapefruit and grapefruit juice with Tarceva .  She knows to contact us  if she needs refill of tussinex.   Diarrhea Intermittent diarrhea, well-managed without Imodium. - Consider over-the-counter Imodium if diarrhea becomes problematic.  We will see her back for labs and to review her scan in a few weeks.      The patient was advised to call immediately if she has any concerning symptoms in the interval. The patient voices understanding of current disease status and treatment options and is in agreement with the current care plan. All questions were answered. The patient knows to call the clinic with any problems, questions or concerns. We  can certainly see the patient much sooner if necessary   Orders Placed This Encounter  Procedures   CT Chest W Contrast    Standing Status:   Future    Expected Date:   11/29/2024    Expiration Date:   11/04/2025    If indicated for the ordered procedure, I authorize the administration of contrast media per Radiology protocol:   Yes    Does the patient have a contrast media/X-ray dye allergy?:   No    Preferred imaging location?:   [facility]   CBC with Differential (Cancer Center Only)    Standing Status:   Future    Expected Date:   11/29/2024    Expiration Date:   11/04/2025   CMP (Cancer Center only)    Standing Status:   Future    Expected Date:   11/29/2024    Expiration Date:   11/04/2025     The total time spent in the appointment was 20-29 minutes  Marco Raper L Kawana Hegel, PA-C 11/04/24

## 2024-11-04 ENCOUNTER — Inpatient Hospital Stay

## 2024-11-04 ENCOUNTER — Inpatient Hospital Stay: Attending: Internal Medicine | Admitting: Physician Assistant

## 2024-11-04 DIAGNOSIS — C3431 Malignant neoplasm of lower lobe, right bronchus or lung: Secondary | ICD-10-CM | POA: Insufficient documentation

## 2024-11-04 DIAGNOSIS — R630 Anorexia: Secondary | ICD-10-CM

## 2024-11-04 DIAGNOSIS — Z923 Personal history of irradiation: Secondary | ICD-10-CM | POA: Diagnosis not present

## 2024-11-04 DIAGNOSIS — G4700 Insomnia, unspecified: Secondary | ICD-10-CM

## 2024-11-04 DIAGNOSIS — R059 Cough, unspecified: Secondary | ICD-10-CM | POA: Diagnosis not present

## 2024-11-04 DIAGNOSIS — Z79622 Long term (current) use of janus kinase inhibitor: Secondary | ICD-10-CM | POA: Diagnosis not present

## 2024-11-04 LAB — CBC WITH DIFFERENTIAL (CANCER CENTER ONLY)
Abs Immature Granulocytes: 0.02 K/uL (ref 0.00–0.07)
Basophils Absolute: 0 K/uL (ref 0.0–0.1)
Basophils Relative: 0 %
Eosinophils Absolute: 0.1 K/uL (ref 0.0–0.5)
Eosinophils Relative: 1 %
HCT: 39.5 % (ref 36.0–46.0)
Hemoglobin: 12.3 g/dL (ref 12.0–15.0)
Immature Granulocytes: 0 %
Lymphocytes Relative: 22 %
Lymphs Abs: 1.5 K/uL (ref 0.7–4.0)
MCH: 28.1 pg (ref 26.0–34.0)
MCHC: 31.1 g/dL (ref 30.0–36.0)
MCV: 90.4 fL (ref 80.0–100.0)
Monocytes Absolute: 0.6 K/uL (ref 0.1–1.0)
Monocytes Relative: 8 %
Neutro Abs: 4.7 K/uL (ref 1.7–7.7)
Neutrophils Relative %: 69 %
Platelet Count: 245 K/uL (ref 150–400)
RBC: 4.37 MIL/uL (ref 3.87–5.11)
RDW: 14.9 % (ref 11.5–15.5)
WBC Count: 7 K/uL (ref 4.0–10.5)
nRBC: 0 % (ref 0.0–0.2)

## 2024-11-04 LAB — CMP (CANCER CENTER ONLY)
ALT: 15 U/L (ref 0–44)
AST: 27 U/L (ref 15–41)
Albumin: 4.3 g/dL (ref 3.5–5.0)
Alkaline Phosphatase: 110 U/L (ref 38–126)
Anion gap: 8 (ref 5–15)
BUN: 7 mg/dL — ABNORMAL LOW (ref 8–23)
CO2: 30 mmol/L (ref 22–32)
Calcium: 8.8 mg/dL — ABNORMAL LOW (ref 8.9–10.3)
Chloride: 104 mmol/L (ref 98–111)
Creatinine: 0.65 mg/dL (ref 0.44–1.00)
GFR, Estimated: >60 mL/min (ref >60)
Glucose, Bld: 122 mg/dL — ABNORMAL HIGH (ref 70–99)
Potassium: 4.3 mmol/L (ref 3.5–5.1)
Sodium: 142 mmol/L (ref 135–145)
Total Bilirubin: 0.5 mg/dL (ref 0.0–1.2)
Total Protein: 7.2 g/dL (ref 6.5–8.1)

## 2024-11-04 MED ORDER — CLINDAMYCIN PHOSPHATE 1 % EX LOTN: TOPICAL_LOTION | Freq: Two times a day (BID) | CUTANEOUS | 0 refills | Status: AC

## 2024-11-29 ENCOUNTER — Ambulatory Visit (HOSPITAL_COMMUNITY)
Admission: RE | Admit: 2024-11-29 | Discharge: 2024-11-29 | Disposition: A | Discharge status: Home / Self Care | Source: Ambulatory Visit | Attending: Physician Assistant | Admitting: Physician Assistant

## 2024-11-29 DIAGNOSIS — C3431 Malignant neoplasm of lower lobe, right bronchus or lung: Secondary | ICD-10-CM | POA: Insufficient documentation

## 2024-11-29 MED ORDER — IOHEXOL 300 MG/ML  SOLN
75.0000 mL | Freq: Once | INTRAMUSCULAR | Status: AC | PRN
  Administered 2024-11-29: 75 mL via INTRAVENOUS

## 2024-12-03 ENCOUNTER — Inpatient Hospital Stay: Attending: Internal Medicine

## 2024-12-03 ENCOUNTER — Inpatient Hospital Stay (HOSPITAL_BASED_OUTPATIENT_CLINIC_OR_DEPARTMENT_OTHER): Admitting: Internal Medicine

## 2024-12-03 VITALS — BP 181/69 | HR 82 | Temp 97.6°F | Resp 17 | Ht 59.0 in | Wt 111.5 lb

## 2024-12-03 DIAGNOSIS — C3431 Malignant neoplasm of lower lobe, right bronchus or lung: Secondary | ICD-10-CM | POA: Diagnosis not present

## 2024-12-03 LAB — CBC WITH DIFFERENTIAL (CANCER CENTER ONLY)
Abs Immature Granulocytes: 0.01 K/uL (ref 0.00–0.07)
Basophils Absolute: 0 K/uL (ref 0.0–0.1)
Basophils Relative: 1 %
Eosinophils Absolute: 0.1 K/uL (ref 0.0–0.5)
Eosinophils Relative: 2 %
HCT: 38.9 % (ref 36.0–46.0)
Hemoglobin: 12.7 g/dL (ref 12.0–15.0)
Immature Granulocytes: 0 %
Lymphocytes Relative: 25 %
Lymphs Abs: 1.6 K/uL (ref 0.7–4.0)
MCH: 28.7 pg (ref 26.0–34.0)
MCHC: 32.6 g/dL (ref 30.0–36.0)
MCV: 88 fL (ref 80.0–100.0)
Monocytes Absolute: 0.5 K/uL (ref 0.1–1.0)
Monocytes Relative: 8 %
Neutro Abs: 4.2 K/uL (ref 1.7–7.7)
Neutrophils Relative %: 64 %
Platelet Count: 265 K/uL (ref 150–400)
RBC: 4.42 MIL/uL (ref 3.87–5.11)
RDW: 14 % (ref 11.5–15.5)
WBC Count: 6.4 K/uL (ref 4.0–10.5)
nRBC: 0 % (ref 0.0–0.2)

## 2024-12-03 LAB — CMP (CANCER CENTER ONLY)
ALT: 11 U/L (ref 0–44)
AST: 25 U/L (ref 15–41)
Albumin: 4.5 g/dL (ref 3.5–5.0)
Alkaline Phosphatase: 90 U/L (ref 38–126)
Anion gap: 9 (ref 5–15)
BUN: 9 mg/dL (ref 8–23)
CO2: 28 mmol/L (ref 22–32)
Calcium: 9.2 mg/dL (ref 8.9–10.3)
Chloride: 103 mmol/L (ref 98–111)
Creatinine: 0.66 mg/dL (ref 0.44–1.00)
GFR, Estimated: >60 mL/min
Glucose, Bld: 99 mg/dL (ref 70–99)
Potassium: 4.5 mmol/L (ref 3.5–5.1)
Sodium: 140 mmol/L (ref 135–145)
Total Bilirubin: 1.3 mg/dL — ABNORMAL HIGH (ref 0.0–1.2)
Total Protein: 7.6 g/dL (ref 6.5–8.1)

## 2024-12-03 NOTE — Progress Notes (Signed)
 #        [facility] [phone]   [phone]) [phone]  OFFICE PROGRESS NOTE  Purcell Emil Schanz, MD [address] [place] [address]  PRINCIPAL DIAGNOSIS: Local recurrence of non-small cell lung cancer, adenocarcinoma, initially diagnosed as stage IB (T2a N0 M0) in March of 2010.   PRIOR THERAPY:  Status post right lower lobectomy under the care of Dr. Lucas on 04/08/2009. Status post palliative radiotherapy to the right lower lobe recurrent lung mass under the care of Dr. Dewey, completed on June 08, 2011. Systemic chemotherapy with carboplatin  for AUC of 5 and Alimta  500 mg/M2 every 3 weeks. She is status post 3 cycles. SBRT to the recurrent right lung tumor under the care of Dr. Patrcia, last dose on 10/30/23.   CURRENT THERAPY: Tarceva  150 mg by mouth daily, therapy beginning 07/24/2012. Status post approximately 150 months of therapy.   INTERVAL HISTORY: 64 years old Asian female returns to the clinic today for follow-up visit accompanied by her interpreter.Discussed the use of AI scribe software for clinical note transcription with the patient, who gave verbal consent to proceed.  History of Present Illness [name] is a 64 year old female with metastatic non-small cell lung adenocarcinoma who presents for evaluation and restaging.  She has been maintained on erlotinib  150 mg daily for approximately 115 months for metastatic non-small cell lung adenocarcinoma. She continues to experience a chronic cough, managed with benzonatate  once daily, and her respiratory status remains stable without new complaints.  This week, she developed increased malaise, rhinorrhea, and a dry cough, consistent with an acute upper respiratory infection. She is the only member of her household affected.  She reports a new lesion arising from the inside of her eyelid, associated with local inflammation. She applies topical clindamycin  for a skin condition and has  previously used doxycycline , but is not currently taking it. There is no visual disturbance at this time.  Her blood pressure was elevated at the visit (154/72 mmHg). She does not routinely monitor her blood pressure at home and is not on antihypertensive therapy.     MEDICAL HISTORY: Past Medical History:  Diagnosis Date   Drug-induced skin rash 02/27/2017   Encounter for antineoplastic chemotherapy 01/19/2016   History of radiation therapy 05/02/11 to 06/08/11   right lung   Lung cancer (HCC)    right lower lobe adenocarcinoma    ALLERGIES:  is allergic to codeine  and doxycycline .  MEDICATIONS:  Current Outpatient Medications  Medication Sig Dispense Refill   albuterol  (PROAIR  HFA) 108 (90 Base) MCG/ACT inhaler Inhale 1-2 puffs into the lungs every 6 (six) hours as needed for wheezing or shortness of breath. 6.7 g 2   chlorpheniramine-HYDROcodone  (TUSSIONEX) 10-8 MG/5ML Take 5 mLs by mouth every 12 (twelve) hours as needed for cough. 473 mL 0   clindamycin  (CLEOCIN  T) 1 % lotion Apply topically 2 (two) times daily. 60 mL 0   clindamycin  (CLEOCIN  T) 1 % lotion Apply topically 2 (two) times daily. 60 mL 0   diltiazem  (CARDIZEM ) 60 MG tablet Take 1 tablet (60 mg total) by mouth as needed (high heart rate). 60 tablet 3   diltiazem  (TIADYLT  ER) 120 MG 24 hr capsule Take 1 capsule by mouth daily. 90 capsule 3   doxycycline  (VIBRA -TABS) 100 MG tablet Take 1 tablet (100 mg total) by mouth 2 (two) times daily. 20 tablet 0   doxycycline  (VIBRA -TABS) 100 MG tablet Take 1 tablet (100 mg total) by mouth 2 (two)  times daily. 14 tablet 0   doxycycline  (VIBRA -TABS) 100 MG tablet Take 1 tablet (100 mg total) by mouth 2 (two) times daily. 14 tablet 0   erlotinib  (TARCEVA ) 150 MG tablet Take 1 tablet (150 mg total) by mouth daily. Take on an empty stomach 1 hour before meals or 2 hours after. 30 tablet 2   methylPREDNISolone  (MEDROL  DOSEPAK) 4 MG TBPK tablet Use as instructed 21 tablet 0   mirtazapine   (REMERON ) 7.5 MG tablet Take 1 tablet (7.5 mg total) by mouth at bedtime. 30 tablet 2   prochlorperazine  (COMPAZINE ) 10 MG tablet Take 1 tablet (10 mg total) by mouth every 6 (six) hours as needed. 30 tablet 2   silver  sulfADIAZINE  (SILVADENE ) 1 % cream Apply topically daily. Cleanse foot wound with water, pat dry. Apply Silvadene  daily. Top with dry dressing     No current facility-administered medications for this visit.    SURGICAL HISTORY:  Past Surgical History:  Procedure Laterality Date   BRONCHIAL BIOPSY  09/05/2023   Procedure: BRONCHIAL BIOPSIES;  Surgeon: Shelah Lamar RAMAN, MD;  Location: [facility] ENDOSCOPY;  Service: Pulmonary;;   BRONCHIAL BRUSHINGS  09/05/2023   Procedure: BRONCHIAL BRUSHINGS;  Surgeon: Shelah Lamar RAMAN, MD;  Location: [facility] ENDOSCOPY;  Service: Pulmonary;;   BRONCHIAL NEEDLE ASPIRATION BIOPSY  09/05/2023   Procedure: BRONCHIAL NEEDLE ASPIRATION BIOPSIES;  Surgeon: Shelah Lamar RAMAN, MD;  Location: [facility] ENDOSCOPY;  Service: Pulmonary;;   FIDUCIAL MARKER PLACEMENT  09/05/2023   Procedure: FIDUCIAL MARKER PLACEMENT;  Surgeon: Shelah Lamar RAMAN, MD;  Location: [facility] ENDOSCOPY;  Service: Pulmonary;;   LUNG LOBECTOMY  04/18/2009   RLL   PORTACATH PLACEMENT  02/29/2012   Procedure: INSERTION PORT-A-CATH;  Surgeon: Nancyann SHAUNNA Nam, MD;  Location: [facility] OR;  Service: Thoracic;  Laterality: Left;  PowerPort 8 F attachable in left internal jugular.    REVIEW OF SYSTEMS:  Constitutional: positive for fatigue Eyes: positive for irritation Ears, nose, mouth, throat, and face: negative Respiratory: positive for cough Cardiovascular: negative Gastrointestinal: negative Genitourinary:negative Integument/breast: negative Hematologic/lymphatic: negative Musculoskeletal:negative Neurological: negative Behavioral/Psych: negative Endocrine: negative Allergic/Immunologic: negative   PHYSICAL EXAMINATION: General appearance: alert, cooperative, appears stated age, fatigued, and no distress Head:  Normocephalic, without obvious abnormality, atraumatic Neck: no adenopathy, no JVD, supple, symmetrical, trachea midline, and thyroid  not enlarged, symmetric, no tenderness/mass/nodules Lymph nodes: Cervical, supraclavicular, and axillary nodes normal. Resp: clear to auscultation bilaterally Back: symmetric, no curvature. ROM normal. No CVA tenderness. Cardio: regular rate and rhythm, S1, S2 normal, no murmur, click, rub or gallop GI: soft, non-tender; bowel sounds normal; no masses,  no organomegaly Extremities: extremities normal, atraumatic, no cyanosis or edema Neurologic: Alert and oriented X 3, normal strength and tone. Normal symmetric reflexes. Normal coordination and gait  ECOG PERFORMANCE STATUS: 1 - Symptomatic but completely ambulatory  Blood pressure (!) 181/69, pulse 82, temperature 97.6 F (36.4 C), temperature source Temporal, resp. rate 17, height 4' 11 (1.499 m), weight 111 lb 8 oz (50.6 kg), last menstrual period 02/10/2012, SpO2 98%.  LABORATORY DATA: Lab Results  Component Value Date   WBC 6.4 12/03/2024   HGB 12.7 12/03/2024   HCT 38.9 12/03/2024   MCV 88.0 12/03/2024   PLT 265 12/03/2024      Chemistry      Component Value Date/Time   NA 140 12/03/2024 0849   NA 143 10/30/2017 1031   K 4.5 12/03/2024 0849   K 4.3 10/30/2017 1031   CL 103 12/03/2024 0849   CL 102 04/30/2013 1002  CO2 28 12/03/2024 0849   CO2 29 10/30/2017 1031   BUN 9 12/03/2024 0849   BUN 14.6 10/30/2017 1031   CREATININE 0.66 12/03/2024 0849   CREATININE 0.7 10/30/2017 1031      Component Value Date/Time   CALCIUM  9.2 12/03/2024 0849   CALCIUM  9.2 10/30/2017 1031   ALKPHOS 90 12/03/2024 0849   ALKPHOS 103 10/30/2017 1031   AST 25 12/03/2024 0849   AST 20 10/30/2017 1031   ALT 11 12/03/2024 0849   ALT 14 10/30/2017 1031   BILITOT 1.3 (H) 12/03/2024 0849   BILITOT 0.79 10/30/2017 1031       RADIOGRAPHIC STUDIES: CT Chest W Contrast Result Date: 12/02/2024 EXAM: CT CHEST  WITH CONTRAST 11/29/2024 11:38:22 AM TECHNIQUE: CT of the chest was performed with the administration of 75 mL of iohexol  (OMNIPAQUE ) 300 MG/ML solution intravenous contrast. Multiplanar reformatted images are provided for review. Automated exposure control, iterative reconstruction, and/or weight based adjustment of the mA/kV was utilized to reduce the radiation dose to as low as reasonably achievable. COMPARISON: PET CT 08/26/2024. CLINICAL HISTORY: Non-small cell lung cancer (NSCLC), non-metastatic, assess treatment response. No CP, SOB, cough. Dx: Primary cancer of right lower lobe of lung. * Tracking Code: BO * FINDINGS: MEDIASTINUM: Heart and pericardium are unremarkable. The central airways are clear. LYMPH NODES: No mediastinal, hilar or axillary lymphadenopathy. LUNGS AND PLEURA: Volume loss in the right hemithorax consistent with right lower lobectomy. Dense consolidation in the inferior right hemithorax following right lobe lobectomy, not changed from comparison FDG PET scan. This was not hypermetabolic on comparison exam. Airspace disease in the right upper lobe similar to comparison PET CT scan. No left pulmonary nodules. No pleural effusion or pneumothorax. SOFT TISSUES/BONES: Post therapy change in the posterior right ribs adjacent to the lobectomy. No acute abnormality of the bones or soft tissues. UPPER ABDOMEN: Limited images of the upper abdomen demonstrates no acute abnormality. IMPRESSION: 1. Dense consolidation in the inferior right hemithorax following right lower lobectomy, not changed from comparison FDG PET scan, with no evidence of local recurrence in the right lung CT imaging . 2. No evidence of metastatic disease. Electronically signed by: Norleen Boxer MD 12/02/2024 05:20 PM EST RP Workstation: [id number]       ASSESSMENT AND PLAN:  This is a very pleasant 64 years old Asian female with recurrent non-small cell lung cancer, adenocarcinoma status post surgical resection followed by  systemic chemotherapy with carboplatin  and Alimta  followed by disease progression and the patient was started on treatment with Tarceva  150 mg by mouth daily status post 150 months  The patient was referred to Dr. Shelah and she underwent bronchoscopy that showed recurrent adenocarcinoma. She was treated with SBRT to the right upper lobe lung mass under the care of Dr. Patrcia. The patient has been tolerating her treatment with Tarceva  fairly well. She had repeat CT scan of the chest performed recently.  I personally independently reviewed the scan and discussed the result with the patient today.  Her scan showed no concerning findings for disease progression. Assessment and Plan Assessment & Plan Metastatic non-small cell lung cancer, adenocarcinoma Locally recurrent and metastatic non-small cell lung adenocarcinoma, initially diagnosed as stage 1B in March 2010. Disease remains well-controlled on long-term erlotinib , with recent chest CT confirming stable disease. Chronic cough persists, likely multifactorial due to malignancy and erlotinib . No evidence of progression. - Reviewed recent chest CT demonstrating stable disease. - Continued erlotinib  (Tarceva ) 150 mg daily. - Provided reassurance regarding disease status. -  Scheduled follow-up in two months. - Ordered laboratory studies for next visit.  Acute upper respiratory infection New onset rhinorrhea and dry cough consistent with viral upper respiratory infection, distinct from chronic cough. - Advised supportive care for viral symptoms. - Instructed to report persistent symptoms after resolution of acute infection.  Eyelid lesion New eyelid lesion with inflammation of unclear etiology. Prolonged antibiotics avoided due to risk of resistance and adverse effects. Further evaluation may be required if symptoms worsen. - Advised application of clindamycin  lotion to affected area. - Recommended ophthalmology evaluation if lesion worsens or  fails to improve.  Elevated blood pressure Elevated blood pressure at today's visit, improved on repeat measurement. She monitors her blood pressure at home and is not on antihypertensive therapy. - Advised home blood pressure monitoring. She was advised to call immediately if she has any other concerning symptoms in the interval.  All questions were answered. The patient knows to call the clinic with any problems, questions or concerns. We can certainly see the patient much sooner if necessary.  The total time spent in the appointment was 30 minutes.  Disclaimer: This note was dictated with voice recognition software. Similar sounding words can inadvertently be transcribed and may not be corrected upon review.

## 2024-12-05 ENCOUNTER — Other Ambulatory Visit (HOSPITAL_COMMUNITY): Payer: Self-pay

## 2024-12-05 ENCOUNTER — Encounter: Payer: Self-pay | Admitting: Internal Medicine

## 2024-12-05 ENCOUNTER — Telehealth: Payer: Self-pay

## 2024-12-05 ENCOUNTER — Other Ambulatory Visit: Payer: Self-pay

## 2024-12-05 ENCOUNTER — Telehealth: Payer: Self-pay | Admitting: Pharmacist

## 2024-12-05 DIAGNOSIS — C3490 Malignant neoplasm of unspecified part of unspecified bronchus or lung: Secondary | ICD-10-CM

## 2024-12-05 MED ORDER — ERLOTINIB HCL 150 MG PO TABS: 150.0000 mg | ORAL_TABLET | Freq: Every day | ORAL | 2 refills | Status: DC

## 2024-12-05 MED ORDER — ERLOTINIB HCL 150 MG PO TABS
150.0000 mg | ORAL_TABLET | Freq: Every day | ORAL | 2 refills | Status: AC
  Filled 2024-12-06: qty 30, 30d supply, fill #0
  Filled 2025-01-03: qty 30, 30d supply, fill #1

## 2024-12-05 NOTE — Telephone Encounter (Signed)
 Oral Oncology Patient Advocate Encounter  Was successful in securing patient a $6000 grant from [facility] to provide copayment coverage for erlotinib  (TARCEVA ) 150 MG tablet .  This will keep the out of pocket expense at $0.     [employer] ID: [id number]   The billing information is as follows and has been shared with [facility].    RxBin: [id number] PCN: [id number] Member ID: [id number] Group ID: [id number] Dates of Eligibility: 11/05/24 through 11/04/25  Fund:  NSCLC  Lucie Lamer, CPhT [place]  [facility] Oncology Pharmacy Patient Advocate Specialist [facility] [place] Phone: [phone]  Fax: [phone] [email or web address]

## 2024-12-05 NOTE — Telephone Encounter (Signed)
 Oral Oncology Pharmacist Encounter  NSCLC Lorrene obtained to cover cost of patient's erlotinib . This should keep patient's cost at $0 for 2026. (Patient has been paying cash price at [employer]).  Will re-direct prescription to [facility] for dispensing.   [name], PharmD, BCPS, BCOP Hematology/Oncology Clinical Pharmacist [phone] 12/05/2024 9:10 AM

## 2024-12-06 ENCOUNTER — Other Ambulatory Visit: Payer: Self-pay

## 2024-12-06 ENCOUNTER — Other Ambulatory Visit (HOSPITAL_COMMUNITY): Payer: Self-pay

## 2024-12-06 NOTE — Progress Notes (Signed)
 Specialty Pharmacy Initial Fill Coordination Note  Tekila Caillouet is a 64 y.o. female contacted today regarding refills of specialty medication(s) Erlotinib  HCl (TARCEVA ) .  Patient requested Delivery  on 12/09/24  to verified address [address]   [place] [address]   Medication will be filled on 12/06/24.   Patient is aware of $0 copayment.  She has a grant to cover her copay.  Lucie Lamer, CPhT [place]  [facility] Services Oncology Pharmacy Patient Advocate Specialist [facility] [place] Phone: [phone]  Fax: [phone] [email or web address]

## 2024-12-06 NOTE — Progress Notes (Signed)
 Specialty Pharmacy Initiation Note   [name] is a 64 y.o. female who will be followed by the specialty pharmacy service for RxSp Oncology    Review of administration, indication, effectiveness, safety, potential side effects, storage/disposable, and missed dose instructions occurred today for patient's specialty medication(s) Erlotinib  HCl (TARCEVA )     Patient/Caregiver did not have any additional questions or concerns.   Patient's therapy is appropriate to: Continue  Patient has been on erlotinib  since 07/24/2012   Goals Addressed             This Visit's Progress    Slow Disease Progression       Patient is on track. Patient will maintain adherence         [name], PharmD, BCPS, BCOP Hematology/Oncology Clinical Pharmacist [phone] 12/06/2024 1:01 PM

## 2024-12-13 ENCOUNTER — Other Ambulatory Visit: Payer: Self-pay

## 2024-12-13 ENCOUNTER — Other Ambulatory Visit (HOSPITAL_COMMUNITY): Payer: Self-pay

## 2024-12-13 NOTE — Progress Notes (Signed)
 I contacted the patients son [place] [phone]) again and left a detailed voicemail apologizing for the delay in his mothers (patient) specialty medication. I informed him that the package is scheduled for same-day delivery on 12/13/24. A note was added to the delivery instructions requesting that the package be left at the door with no signature required. However, because this is not guaranteed, I advised him that someone may still need to be present to sign for the delivery. I also provided the pharmacys callback number for any questions or concerns.  Same-Day courier (NO signature required) sending 12/13/24.  Control Number: [id number]

## 2024-12-13 NOTE — Progress Notes (Signed)
 Left a voicemail for the patients son [place] [phone]) regarding the missed medication fill. The patients scheduled delivery date was 12/09/24; however, due to a system error, the medication was not processed as planned. Per [facility]), we can arrange a same-day courier to the patients home with signature required, or we can ship the medication as usual for delivery on Monday, 12/16/24. According to [facility]), the patient had additional supply remaining as of 12/06/24.

## 2024-12-16 ENCOUNTER — Other Ambulatory Visit: Payer: Self-pay | Admitting: Internal Medicine

## 2024-12-16 ENCOUNTER — Telehealth: Payer: Self-pay | Admitting: Medical Oncology

## 2024-12-16 MED ORDER — DOXYCYCLINE HYCLATE 100 MG PO TABS: 100.0000 mg | ORAL_TABLET | Freq: Two times a day (BID) | ORAL | 0 refills | Status: AC

## 2024-12-16 NOTE — Telephone Encounter (Signed)
 Ra notified of antibiotic sent to preferred pharmacy.

## 2025-01-03 ENCOUNTER — Other Ambulatory Visit (HOSPITAL_COMMUNITY): Payer: Self-pay

## 2025-01-06 ENCOUNTER — Ambulatory Visit: Admitting: Emergency Medicine

## 2025-02-03 ENCOUNTER — Inpatient Hospital Stay

## 2025-02-03 ENCOUNTER — Inpatient Hospital Stay: Admitting: Internal Medicine

## 2025-04-28 ENCOUNTER — Other Ambulatory Visit

## 2025-04-28 ENCOUNTER — Ambulatory Visit: Admitting: Internal Medicine

## 2025-05-16 ENCOUNTER — Ambulatory Visit

## 2025-05-19 ENCOUNTER — Ambulatory Visit
